# Patient Record
Sex: Female | Born: 1960 | Race: Black or African American | Hispanic: No | Marital: Single | Smoking: Former smoker
Health system: Southern US, Community
[De-identification: ages and names within clinical notes are randomized; demographics above are authoritative.]

## PROBLEM LIST (undated history)

## (undated) DIAGNOSIS — C342 Malignant neoplasm of middle lobe, bronchus or lung: Principal | ICD-10-CM

## (undated) DIAGNOSIS — Z923 Personal history of irradiation: Secondary | ICD-10-CM

## (undated) DIAGNOSIS — Z5111 Encounter for antineoplastic chemotherapy: Secondary | ICD-10-CM

## (undated) DIAGNOSIS — E876 Hypokalemia: Principal | ICD-10-CM

## (undated) DIAGNOSIS — J189 Pneumonia, unspecified organism: Secondary | ICD-10-CM

## (undated) DIAGNOSIS — I4892 Unspecified atrial flutter: Secondary | ICD-10-CM

## (undated) DIAGNOSIS — C801 Malignant (primary) neoplasm, unspecified: Secondary | ICD-10-CM

## (undated) HISTORY — PX: TUBAL LIGATION: SHX77

## (undated) HISTORY — DX: Encounter for antineoplastic chemotherapy: Z51.11

## (undated) HISTORY — DX: Malignant neoplasm of middle lobe, bronchus or lung: C34.2

## (undated) HISTORY — DX: Hypokalemia: E87.6

## (undated) HISTORY — PX: WISDOM TOOTH EXTRACTION: SHX21

---

## 2008-06-04 ENCOUNTER — Emergency Department (HOSPITAL_COMMUNITY): Admission: EM | Admit: 2008-06-04 | Discharge: 2008-06-04 | Payer: Self-pay | Admitting: Emergency Medicine

## 2009-01-10 ENCOUNTER — Emergency Department (HOSPITAL_COMMUNITY): Admission: EM | Admit: 2009-01-10 | Discharge: 2009-01-10 | Payer: Self-pay | Admitting: Emergency Medicine

## 2009-03-10 ENCOUNTER — Emergency Department (HOSPITAL_COMMUNITY): Admission: EM | Admit: 2009-03-10 | Discharge: 2009-03-10 | Payer: Self-pay | Admitting: Emergency Medicine

## 2011-10-08 DIAGNOSIS — C539 Malignant neoplasm of cervix uteri, unspecified: Secondary | ICD-10-CM | POA: Insufficient documentation

## 2013-12-25 DIAGNOSIS — R8789 Other abnormal findings in specimens from female genital organs: Secondary | ICD-10-CM | POA: Diagnosis not present

## 2014-05-24 DIAGNOSIS — C539 Malignant neoplasm of cervix uteri, unspecified: Secondary | ICD-10-CM | POA: Diagnosis not present

## 2014-05-24 DIAGNOSIS — Z9189 Other specified personal risk factors, not elsewhere classified: Secondary | ICD-10-CM | POA: Diagnosis not present

## 2014-05-24 DIAGNOSIS — C531 Malignant neoplasm of exocervix: Secondary | ICD-10-CM | POA: Diagnosis not present

## 2014-05-27 DIAGNOSIS — Z23 Encounter for immunization: Secondary | ICD-10-CM | POA: Diagnosis not present

## 2014-07-05 ENCOUNTER — Emergency Department (HOSPITAL_COMMUNITY)
Admission: EM | Admit: 2014-07-05 | Discharge: 2014-07-05 | Disposition: A | Discharge status: Home / Self Care | Payer: Medicare Other | Attending: Emergency Medicine | Admitting: Emergency Medicine

## 2014-07-05 DIAGNOSIS — M545 Low back pain, unspecified: Secondary | ICD-10-CM

## 2014-07-05 DIAGNOSIS — S3992XA Unspecified injury of lower back, initial encounter: Secondary | ICD-10-CM | POA: Diagnosis not present

## 2014-07-05 DIAGNOSIS — S199XXA Unspecified injury of neck, initial encounter: Secondary | ICD-10-CM | POA: Diagnosis not present

## 2014-07-05 DIAGNOSIS — Z79899 Other long term (current) drug therapy: Secondary | ICD-10-CM | POA: Insufficient documentation

## 2014-07-05 DIAGNOSIS — Y92410 Unspecified street and highway as the place of occurrence of the external cause: Secondary | ICD-10-CM | POA: Diagnosis not present

## 2014-07-05 DIAGNOSIS — Y9389 Activity, other specified: Secondary | ICD-10-CM | POA: Insufficient documentation

## 2014-07-05 DIAGNOSIS — M542 Cervicalgia: Secondary | ICD-10-CM

## 2014-07-05 MED ORDER — KETOROLAC TROMETHAMINE 60 MG/2ML IM SOLN
60.0000 mg | Freq: Once | INTRAMUSCULAR | Status: AC
  Administered 2014-07-05: 60 mg via INTRAMUSCULAR
  Filled 2014-07-05: qty 2

## 2014-07-05 MED ORDER — CYCLOBENZAPRINE HCL 10 MG PO TABS: 10.0000 mg | ORAL_TABLET | Freq: Two times a day (BID) | ORAL | Status: DC | PRN

## 2014-07-05 MED ORDER — TRAMADOL HCL 50 MG PO TABS: 50.0000 mg | ORAL_TABLET | Freq: Four times a day (QID) | ORAL | Status: DC | PRN
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
## 2014-07-05 NOTE — Discharge Instructions (Signed)
Take Tramadol as needed for pain. Take Flexeril as needed for muscle spasms. You may take these medications together. Refer to attached documents for more information.
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
## 2014-07-06 NOTE — ED Provider Notes (Signed)
Medical screening examination/treatment/procedure(s) were performed by non-physician practitioner and as supervising physician I was immediately available for consultation/collaboration.   EKG Interpretation None       Jasper Riling. Alvino Chapel, MD 07/06/14 [phone]

## 2014-10-04 DIAGNOSIS — Z124 Encounter for screening for malignant neoplasm of cervix: Secondary | ICD-10-CM | POA: Diagnosis not present

## 2014-10-04 DIAGNOSIS — R87610 Atypical squamous cells of undetermined significance on cytologic smear of cervix (ASC-US): Secondary | ICD-10-CM | POA: Diagnosis not present

## 2014-10-04 DIAGNOSIS — C539 Malignant neoplasm of cervix uteri, unspecified: Secondary | ICD-10-CM | POA: Diagnosis not present

## 2014-10-04 DIAGNOSIS — Z8541 Personal history of malignant neoplasm of cervix uteri: Secondary | ICD-10-CM | POA: Diagnosis not present

## 2014-10-04 DIAGNOSIS — N72 Inflammatory disease of cervix uteri: Secondary | ICD-10-CM | POA: Diagnosis not present

## 2015-02-07 DIAGNOSIS — Z1231 Encounter for screening mammogram for malignant neoplasm of breast: Secondary | ICD-10-CM | POA: Diagnosis not present

## 2015-02-07 DIAGNOSIS — Z8541 Personal history of malignant neoplasm of cervix uteri: Secondary | ICD-10-CM | POA: Diagnosis not present

## 2015-02-07 DIAGNOSIS — C539 Malignant neoplasm of cervix uteri, unspecified: Secondary | ICD-10-CM | POA: Diagnosis not present

## 2015-02-08 ENCOUNTER — Emergency Department (HOSPITAL_COMMUNITY): Payer: Medicare Other

## 2015-02-08 ENCOUNTER — Emergency Department (HOSPITAL_COMMUNITY)
Admission: EM | Admit: 2015-02-08 | Discharge: 2015-02-08 | Disposition: A | Discharge status: Home / Self Care | Payer: Medicare Other | Attending: Emergency Medicine | Admitting: Emergency Medicine

## 2015-02-08 ENCOUNTER — Encounter (HOSPITAL_COMMUNITY): Payer: Self-pay | Admitting: *Deleted

## 2015-02-08 DIAGNOSIS — Z79899 Other long term (current) drug therapy: Secondary | ICD-10-CM | POA: Insufficient documentation

## 2015-02-08 DIAGNOSIS — X58XXXA Exposure to other specified factors, initial encounter: Secondary | ICD-10-CM | POA: Diagnosis not present

## 2015-02-08 DIAGNOSIS — Z859 Personal history of malignant neoplasm, unspecified: Secondary | ICD-10-CM | POA: Diagnosis not present

## 2015-02-08 DIAGNOSIS — Y999 Unspecified external cause status: Secondary | ICD-10-CM | POA: Insufficient documentation

## 2015-02-08 DIAGNOSIS — Y929 Unspecified place or not applicable: Secondary | ICD-10-CM | POA: Diagnosis not present

## 2015-02-08 DIAGNOSIS — Y939 Activity, unspecified: Secondary | ICD-10-CM | POA: Insufficient documentation

## 2015-02-08 DIAGNOSIS — M542 Cervicalgia: Secondary | ICD-10-CM | POA: Diagnosis present

## 2015-02-08 DIAGNOSIS — S161XXA Strain of muscle, fascia and tendon at neck level, initial encounter: Secondary | ICD-10-CM | POA: Diagnosis not present

## 2015-02-08 HISTORY — DX: Malignant (primary) neoplasm, unspecified: C80.1

## 2015-02-08 MED ORDER — HYDROMORPHONE HCL 1 MG/ML IJ SOLN
1.0000 mg | Freq: Once | INTRAMUSCULAR | Status: AC
Start: 2015-02-08 — End: 2015-02-08
  Administered 2015-02-08: 1 mg via INTRAMUSCULAR
  Filled 2015-02-08: qty 1

## 2015-02-08 MED ORDER — KETOROLAC TROMETHAMINE 60 MG/2ML IM SOLN
60.0000 mg | Freq: Once | INTRAMUSCULAR | Status: AC
Start: 2015-02-08 — End: 2015-02-08
  Administered 2015-02-08: 60 mg via INTRAMUSCULAR
  Filled 2015-02-08: qty 2

## 2015-02-08 MED ORDER — DEXAMETHASONE SODIUM PHOSPHATE 10 MG/ML IJ SOLN
10.0000 mg | Freq: Once | INTRAMUSCULAR | Status: AC
  Administered 2015-02-08: 10 mg via INTRAMUSCULAR
  Filled 2015-02-08: qty 1

## 2015-02-08 MED ORDER — PREDNISONE 50 MG PO TABS: 50.0000 mg | ORAL_TABLET | Freq: Every day | ORAL | Status: DC

## 2015-02-08 MED ORDER — OXYCODONE-ACETAMINOPHEN 5-325 MG PO TABS
1.0000 | ORAL_TABLET | Freq: Once | ORAL | Status: AC
  Administered 2015-02-08: 1 via ORAL
  Filled 2015-02-08: qty 1

## 2015-02-08 MED ORDER — DIAZEPAM 5 MG PO TABS
5.0000 mg | ORAL_TABLET | Freq: Once | ORAL | Status: AC
  Administered 2015-02-08: 5 mg via ORAL
  Filled 2015-02-08: qty 1

## 2015-02-08 MED ORDER — HYDROCODONE-ACETAMINOPHEN 5-325 MG PO TABS: 1.0000 | ORAL_TABLET | Freq: Four times a day (QID) | ORAL | Status: DC | PRN

## 2015-02-08 MED ORDER — DIAZEPAM 5 MG PO TABS: 5.0000 mg | ORAL_TABLET | Freq: Three times a day (TID) | ORAL | Status: DC | PRN

## 2015-02-08 NOTE — ED Notes (Signed)
Patient states that yesterday she had a sharp pain in her left neck that has not gone away. She states she was unable to sleep last night. Patient states she has not had any injury to provoke the sudden onset of pain. Patient appears to be having spasms while we are speaking. She states it hurts when she moves and while at rest.

## 2015-02-08 NOTE — Discharge Instructions (Signed)
Your  x-rays did not show any abnormality.  Use ice and heat on your neck alternating. return here as needed.  Follow-up with a primary care doctor

## 2015-02-08 NOTE — ED Provider Notes (Signed)
CSN: [id number]     Arrival date & time 02/08/15  1100 History   First MD Initiated Contact with Patient 02/08/15 1125     Chief Complaint  Patient presents with  . Neck Pain     (Consider location/radiation/quality/duration/timing/severity/associated sxs/prior Treatment) HPI Patient presents to the emergency department with left-sided neck pain and the left lateral posterior neck that yesterday morning.  The patient states she woke up yesterday morning with neck pain.  She states as the day went on a get worse and got to the point where she could not sleep last night.  The patient states that she did not take any medications prior to arrival for her symptoms.  Movement and palpation make the pain worse.  The patient denies headache, blurred vision, nausea, vomiting, weakness, dizziness, lightheadedness, back pain, fever, chest pain, shortness of breath, neck swelling, rash, or syncope.  The patient states that applying pressure to the area does help somewhat with the pain Past Medical History  Diagnosis Date  . Cancer     5 years ago   History reviewed. No pertinent past surgical history. No family history on file. History  Substance Use Topics  . Smoking status: Never Smoker   . Smokeless tobacco: Not on file  . Alcohol Use: No   OB History    No data available     Review of Systems  All other systems negative except as documented in the HPI. All pertinent positives and negatives as reviewed in the HPI.-  Allergies  Review of patient's allergies indicates no known allergies.  Home Medications   Prior to Admission medications   Medication Sig Start Date End Date Taking? Authorizing Provider  Calcium Carb-Cholecalciferol 600-200 MG-UNIT TABS Take 2 tablets by mouth daily.   Yes Historical Provider, MD  Multiple Vitamins-Minerals (CENTRUM SILVER) tablet Take 1 tablet by mouth daily.   Yes Historical Provider, MD  cyclobenzaprine (FLEXERIL) 10 MG tablet Take 1 tablet (10 mg  total) by mouth 2 (two) times daily as needed for muscle spasms. Patient not taking: Reported on 02/08/2015 07/05/14   Alvina Chou, PA-C  traMADol (ULTRAM) 50 MG tablet Take 1 tablet (50 mg total) by mouth every 6 (six) hours as needed. Patient not taking: Reported on 02/08/2015 07/05/14   Kaitlyn Szekalski, PA-C   BP 128/75 mmHg  Pulse 91  Temp(Src) 98 F (36.7 C) (Oral)  Resp 20  SpO2 100% Physical Exam  Constitutional: She is oriented to person, place, and time. She appears well-developed and well-nourished. No distress.  HENT:  Head: Normocephalic and atraumatic.  Mouth/Throat: Oropharynx is clear and moist.  Eyes: Pupils are equal, round, and reactive to light.  Neck: Trachea normal and normal range of motion. Neck supple. Normal carotid pulses and no JVD present. Muscular tenderness present. No spinous process tenderness present. Carotid bruit is not present. No rigidity. No edema, no erythema and normal range of motion present. No thyroid mass and no thyromegaly present.    Cardiovascular: Normal rate, regular rhythm and normal heart sounds.  Exam reveals no gallop and no friction rub.   No murmur heard. Pulmonary/Chest: Effort normal. No respiratory distress.  Neurological: She is alert and oriented to person, place, and time. She exhibits normal muscle tone. Coordination normal.  Skin: Skin is warm and dry. No rash noted. No erythema.  Nursing note and vitals reviewed.   ED Course  Procedures (including critical care time) Labs Review Labs Reviewed - No data to display  Imaging Review Dg  Cervical Spine Complete  02/08/2015   CLINICAL DATA:  Severe left-sided neck pain with limited range of motion since yesterday. No known trauma.  EXAM: CERVICAL SPINE  4+ VIEWS  COMPARISON:  CT scan dated 06/04/2008  FINDINGS: There is no evidence of cervical spine fracture or prevertebral soft tissue swelling. Alignment is normal. No other significant bone abnormalities are  identified.  IMPRESSION: Negative cervical spine radiographs.   Electronically Signed   By: Lorriane Shire M.D.   On: 02/08/2015 12:35     Patient most likely has a sternocleidomastoid muscle irritation and spasm.  Patient does not have any other symptoms of an indicates any sort of vascular issue.  Movement and palpation make the pain worse.    Dalia Heading, PA-C 02/08/15 [place], MD 02/08/15 [phone]

## 2015-06-04 DIAGNOSIS — Z23 Encounter for immunization: Secondary | ICD-10-CM | POA: Diagnosis not present

## 2015-07-06 ENCOUNTER — Emergency Department (HOSPITAL_COMMUNITY)
Admission: EM | Admit: 2015-07-06 | Discharge: 2015-07-06 | Disposition: A | Discharge status: Home / Self Care | Payer: Medicare Other | Attending: Emergency Medicine | Admitting: Emergency Medicine

## 2015-07-06 ENCOUNTER — Encounter (HOSPITAL_COMMUNITY): Payer: Self-pay | Admitting: Emergency Medicine

## 2015-07-06 ENCOUNTER — Emergency Department (HOSPITAL_COMMUNITY): Payer: Medicare Other

## 2015-07-06 DIAGNOSIS — Z7952 Long term (current) use of systemic steroids: Secondary | ICD-10-CM | POA: Diagnosis not present

## 2015-07-06 DIAGNOSIS — J159 Unspecified bacterial pneumonia: Secondary | ICD-10-CM | POA: Insufficient documentation

## 2015-07-06 DIAGNOSIS — R05 Cough: Secondary | ICD-10-CM | POA: Diagnosis not present

## 2015-07-06 DIAGNOSIS — J189 Pneumonia, unspecified organism: Secondary | ICD-10-CM

## 2015-07-06 DIAGNOSIS — Z79899 Other long term (current) drug therapy: Secondary | ICD-10-CM | POA: Diagnosis not present

## 2015-07-06 DIAGNOSIS — R059 Cough, unspecified: Secondary | ICD-10-CM

## 2015-07-06 DIAGNOSIS — Z859 Personal history of malignant neoplasm, unspecified: Secondary | ICD-10-CM | POA: Insufficient documentation

## 2015-07-06 MED ORDER — AZITHROMYCIN 250 MG PO TABS: 250.0000 mg | ORAL_TABLET | Freq: Every day | ORAL | Status: DC

## 2015-07-06 MED ORDER — ALBUTEROL SULFATE HFA 108 (90 BASE) MCG/ACT IN AERS
1.0000 | INHALATION_SPRAY | Freq: Four times a day (QID) | RESPIRATORY_TRACT | Status: DC | PRN
Start: 2015-07-06 — End: 2015-07-30

## 2015-07-06 MED ORDER — HYDROCOD POLST-CPM POLST ER 10-8 MG/5ML PO SUER: 5.0000 mL | Freq: Two times a day (BID) | ORAL | Status: DC | PRN

## 2015-07-06 MED ORDER — BENZONATATE 100 MG PO CAPS
100.0000 mg | ORAL_CAPSULE | Freq: Three times a day (TID) | ORAL | Status: DC
Start: 2015-07-06 — End: 2015-07-22

## 2015-07-06 MED ORDER — AZITHROMYCIN 250 MG PO TABS
500.0000 mg | ORAL_TABLET | Freq: Once | ORAL | Status: AC
  Administered 2015-07-06: 500 mg via ORAL
  Filled 2015-07-06: qty 2

## 2015-07-06 NOTE — ED Notes (Signed)
Patient reports mowing the lawn prior to onset of symptoms

## 2015-07-06 NOTE — Discharge Instructions (Signed)
Take the prescribed medication as directed.  Use the tussionex syrup when home and/or sleeping, use tessalon perles for awake/work. Follow-up with your primary care physician.  It is recommended that you have repeat x-ray in 3-4 weeks to ensure pneumonia has cleared. Return to the ED for new or worsening symptoms.

## 2015-07-06 NOTE — ED Notes (Signed)
Onset of symptoms 3 weeks ago.  Symptoms includes coughing and coughing so much that head hurts.  Frequent coughing episodes.  Non-productive cough.  Denies fever.

## 2015-07-06 NOTE — ED Provider Notes (Signed)
CSN: [id number]     Arrival date & time 07/06/15  5462 History   First MD Initiated Contact with Patient 07/06/15 [phone]     Chief Complaint  Patient presents with  . URI     (Consider location/radiation/quality/duration/timing/severity/associated sxs/prior Treatment) Patient is a 54 y.o. female presenting with URI. The history is provided by the patient and medical records.  URI Presenting symptoms: cough     54 y.o. F with remote hx of cancer, presenting to the ED for URI type symptoms.  Specifically patient has had non-productive cough, headache, nasal congestion, and postnasal drip. She states she remote her lawn prior to onset of symptoms. She denies history of seasonal allergies. 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 Recently had a TB screening that was negative. She did receive her flu shot this year. She denies any fever, chills, sweats. She has some pain in her midsternal region with coughing, no other chest pain. No shortness of breath. Patient has no history of underlying asthma or COPD.  No intervention tried PTA.  VSS.  Past Medical History  Diagnosis Date  . Cancer     5 years ago   No past surgical history on file. No family history on file. Social History  Substance Use Topics  . Smoking status: Never Smoker   . Smokeless tobacco: Not on file  . Alcohol Use: No   OB History    No data available     Review of Systems  Respiratory: Positive for cough.   All other systems reviewed and are negative.     Allergies  Review of patient's allergies indicates no known allergies.  Home Medications   Prior to Admission medications   Medication Sig Start Date End Date Taking? Authorizing Provider  Calcium Carb-Cholecalciferol 600-200 MG-UNIT TABS Take 2 tablets by mouth daily.    Historical Provider, MD  cyclobenzaprine (FLEXERIL) 10 MG tablet Take 1 tablet (10 mg total) by mouth 2 (two) times daily as needed for muscle spasms. Patient  not taking: Reported on 02/08/2015 07/05/14   Alvina Chou, PA-C  diazepam (VALIUM) 5 MG tablet Take 1 tablet (5 mg total) by mouth every 8 (eight) hours as needed for muscle spasms. 02/08/15   Dalia Heading, PA-C  HYDROcodone-acetaminophen (NORCO/VICODIN) 5-325 MG per tablet Take 1 tablet by mouth every 6 (six) hours as needed for moderate pain. 02/08/15   Dalia Heading, PA-C  Multiple Vitamins-Minerals (CENTRUM SILVER) tablet Take 1 tablet by mouth daily.    Historical Provider, MD  predniSONE (DELTASONE) 50 MG tablet Take 1 tablet (50 mg total) by mouth daily with breakfast. 02/08/15   Dalia Heading, PA-C  traMADol (ULTRAM) 50 MG tablet Take 1 tablet (50 mg total) by mouth every 6 (six) hours as needed. Patient not taking: Reported on 02/08/2015 07/05/14   Kaitlyn Szekalski, PA-C   BP 130/73 mmHg  Pulse 94  Temp(Src) 99.1 F (37.3 C) (Oral)  Resp 22  Ht '5\' 8"'$  (1.727 m)  Wt 223 lb 3.2 oz (101.243 kg)  BMI 33.95 kg/m2  SpO2 100%   Physical Exam  Constitutional: She is oriented to person, place, and time. She appears well-developed and well-nourished. No distress.  HENT:  Head: Normocephalic and atraumatic.  Mouth/Throat: Oropharynx is clear and moist.  Eyes: Conjunctivae and EOM are normal. Pupils are equal, round, and reactive to light.  Neck: Normal range of motion. Neck supple.  Cardiovascular: Normal rate, regular rhythm and normal heart sounds.   Pulmonary/Chest:  Effort normal and breath sounds normal. No respiratory distress. She has no wheezes.  Respirations unlabored, lungs overall clear, repetitive dry cough during exam, speaking in full sentences without difficulty  Abdominal: Soft. Bowel sounds are normal. There is no tenderness. There is no guarding.  Musculoskeletal: Normal range of motion.  Neurological: She is alert and oriented to person, place, and time.  Skin: Skin is warm and dry. She is not diaphoretic.  Psychiatric: She has a normal mood and  affect.  Nursing note and vitals reviewed.   ED Course  Procedures (including critical care time) Labs Review Labs Reviewed - No data to display  Imaging Review Dg Chest 2 View  07/06/2015  CLINICAL DATA:  Nonproductive cough for 3 weeks EXAM: CHEST  2 VIEW COMPARISON:  None. FINDINGS: Dense right middle lobe consolidation. There is an element of volume loss as there is shift of the mediastinum to the right and downward displacement of the minor fissure. Lungs otherwise clear. Normal heart size. No pneumothorax. IMPRESSION: Right middle lobe consolidation and an element of volume loss. Underlying malignancy cannot be excluded. Followup PA and lateral chest X-ray is recommended in 3-4 weeks following trial of antibiotic therapy to ensure resolution and exclude underlying malignancy. Electronically Signed   By: Marybelle Killings M.D.   On: 07/06/2015 08:45      EKG Interpretation None      MDM   Final diagnoses:  CAP (community acquired pneumonia)  Cough   54 year old female with cough for the past 3 weeks. Patient is afebrile, nontoxic. Her lungs are overall clear. She does have a repetitive dry cough during exam. Denies chest pain or shortness of breath. Remainder exam is benign. Chest x-ray was obtained revealing right middle lobe consolidation. Given her symptoms will treat as pneumonia. I discussed need for repeat x-ray in 3-4 weeks to ensure resolution of x-ray findings as malignancy possible.  Patient VS remain stable on RA.  No respiratory distress.  She appears appropriate for outpatient management.  Rx azithromycin, albuterol inhaler, tessalon, tussionex.  Discussed plan with patient, he/she acknowledged understanding and agreed with plan of care.  Return precautions given for new or worsening symptoms.  Larene Pickett, PA-C 07/06/15 [id number]  Pattricia Boss, MD 07/06/15 [phone]

## 2015-07-12 ENCOUNTER — Emergency Department (HOSPITAL_COMMUNITY)
Admission: EM | Admit: 2015-07-12 | Discharge: 2015-07-13 | Disposition: A | Discharge status: Home / Self Care | Payer: Medicare Other | Attending: Emergency Medicine | Admitting: Emergency Medicine

## 2015-07-12 ENCOUNTER — Emergency Department (HOSPITAL_COMMUNITY): Payer: Medicare Other

## 2015-07-12 ENCOUNTER — Encounter (HOSPITAL_COMMUNITY): Payer: Self-pay | Admitting: Emergency Medicine

## 2015-07-12 DIAGNOSIS — Z8589 Personal history of malignant neoplasm of other organs and systems: Secondary | ICD-10-CM | POA: Diagnosis not present

## 2015-07-12 DIAGNOSIS — R0602 Shortness of breath: Secondary | ICD-10-CM | POA: Diagnosis not present

## 2015-07-12 DIAGNOSIS — R05 Cough: Secondary | ICD-10-CM

## 2015-07-12 DIAGNOSIS — R599 Enlarged lymph nodes, unspecified: Secondary | ICD-10-CM | POA: Insufficient documentation

## 2015-07-12 DIAGNOSIS — Z8701 Personal history of pneumonia (recurrent): Secondary | ICD-10-CM | POA: Insufficient documentation

## 2015-07-12 DIAGNOSIS — Z79899 Other long term (current) drug therapy: Secondary | ICD-10-CM | POA: Insufficient documentation

## 2015-07-12 DIAGNOSIS — R059 Cough, unspecified: Secondary | ICD-10-CM

## 2015-07-12 DIAGNOSIS — R062 Wheezing: Secondary | ICD-10-CM | POA: Diagnosis not present

## 2015-07-12 DIAGNOSIS — Z7952 Long term (current) use of systemic steroids: Secondary | ICD-10-CM | POA: Insufficient documentation

## 2015-07-12 DIAGNOSIS — R918 Other nonspecific abnormal finding of lung field: Secondary | ICD-10-CM

## 2015-07-12 DIAGNOSIS — R590 Localized enlarged lymph nodes: Secondary | ICD-10-CM

## 2015-07-12 DIAGNOSIS — J984 Other disorders of lung: Secondary | ICD-10-CM | POA: Diagnosis not present

## 2015-07-12 HISTORY — DX: Pneumonia, unspecified organism: J18.9

## 2015-07-12 LAB — I-STAT CHEM 8, ED
BUN: 11 mg/dL (ref 6–20)
CHLORIDE: 103 mmol/L (ref 101–111)
Calcium, Ion: 1.21 mmol/L (ref 1.12–1.23)
Creatinine, Ser: 0.9 mg/dL (ref 0.44–1.00)
Glucose, Bld: 115 mg/dL — ABNORMAL HIGH (ref 65–99)
HEMATOCRIT: 35 % — AB (ref 36.0–46.0)
HEMOGLOBIN: 11.9 g/dL — AB (ref 12.0–15.0)
POTASSIUM: 3.5 mmol/L (ref 3.5–5.1)
Sodium: 141 mmol/L (ref 135–145)
TCO2: 25 mmol/L (ref 0–100)

## 2015-07-12 MED ORDER — IOHEXOL 350 MG/ML SOLN
100.0000 mL | Freq: Once | INTRAVENOUS | Status: AC | PRN
  Administered 2015-07-12: 100 mL via INTRAVENOUS

## 2015-07-12 NOTE — ED Provider Notes (Signed)
CSN: [id number]     Arrival date & time 07/12/15  2104 History  By signing my name below, I, Emmanuella Mensah, attest that this documentation has been prepared under the direction and in the presence of Joushua Dugar Camprubi-Soms, PA. Electronically Signed: Judithann Sauger, ED Scribe. 07/12/2015. 10:17 PM.     Chief Complaint  Patient presents with  . Cough   Patient is a 54 y.o. female presenting with cough. The history is provided by the patient and medical records. No language interpreter was used.  Cough Cough characteristics:  Non-productive Severity:  Moderate Onset quality:  Gradual Duration:  3 weeks Timing:  Constant Progression:  Unchanged Chronicity:  New Smoker: no   Context: upper respiratory infection   Relieved by:  Nothing Worsened by:  Nothing tried Ineffective treatments:  Cough suppressants and beta-agonist inhaler (cough syrup, and azithromycin) Associated symptoms: shortness of breath and wheezing   Associated symptoms: no chest pain, no chills, no ear pain, no fever, no myalgias, no rhinorrhea, no sinus congestion and no sore throat   Shortness of breath:    Severity:  Mild   Onset quality:  Gradual   Duration:  3 weeks   Timing:  Intermittent   Progression:  Unchanged Wheezing:    Severity:  Mild   Onset quality:  Gradual   Duration:  3 weeks   Timing:  Intermittent   Progression:  Resolved   Chronicity:  New Risk factors: no recent travel    HPI Comments: Theresa [name] is a 54 y.o. female with a hx of cervical cancer 5 years ago and pneumonia, who presents to the Emergency Department complaining of unchanged moderate intermittent non productive cough onset 3 weeks ago after she mowed her lawn. She reports that she was seen on 07/06/15 and was diagnosed with pneumonia, placed on azithromycin, albuterol, tesslon perles, and tussionex. She adds that she had to get half of the prescribed amount of the cough syrup due to cost, but reports that she hasn't  had significant relief of her symptoms since she completed the medications. She reports mild SOB and wheezing which resolves after using her inhaler. She denies any fever, chills, CP, ear pain/drainage, rhinorrhea, sore throat, sputum production/hemoptysis, LE swelling, recent travel/surgery/immobilization, estrogen use, abdominal pain, n/v/d/c, dysuria, hematuria, numbness, tingling, or weakness. She denies smoking. Denies sick contacts or hx of Asthma/COPD.   Past Medical History  Diagnosis Date  . Cancer ([place])     5 years ago  . Pneumonia    History reviewed. No pertinent past surgical history. No family history on file. Social History  Substance Use Topics  . Smoking status: Never Smoker   . Smokeless tobacco: None  . Alcohol Use: No   OB History    No data available     Review of Systems  Constitutional: Negative for fever and chills.  HENT: Negative for ear discharge, ear pain, rhinorrhea, sore throat and trouble swallowing.   Respiratory: Positive for cough, shortness of breath and wheezing.   Cardiovascular: Negative for chest pain and leg swelling.  Gastrointestinal: Negative for nausea, vomiting, abdominal pain, diarrhea and constipation.  Genitourinary: Negative for dysuria and hematuria.  Musculoskeletal: Negative for myalgias and arthralgias.  Skin: Negative for color change.  Allergic/Immunologic: Negative for immunocompromised state.  Neurological: Negative for weakness.  10 Systems reviewed and all are negative for acute change except as noted in the HPI.    Allergies  Review of patient's allergies indicates no known allergies.  Home Medications  Prior to Admission medications   Medication Sig Start Date End Date Taking? Authorizing Provider  albuterol (PROVENTIL HFA;VENTOLIN HFA) 108 (90 BASE) MCG/ACT inhaler Inhale 1-2 puffs into the lungs every 6 (six) hours as needed for wheezing. 07/06/15   Larene Pickett, PA-C  azithromycin (ZITHROMAX) 250 MG tablet  Take 1 tablet (250 mg total) by mouth daily. Take first 2 tablets together, then 1 every day until finished. 07/06/15   Larene Pickett, PA-C  benzonatate (TESSALON) 100 MG capsule Take 1 capsule (100 mg total) by mouth every 8 (eight) hours. 07/06/15   Larene Pickett, PA-C  Calcium Carb-Cholecalciferol 600-200 MG-UNIT TABS Take 2 tablets by mouth daily.    Historical Provider, MD  chlorpheniramine-HYDROcodone (TUSSIONEX PENNKINETIC ER) 10-8 MG/5ML SUER Take 5 mLs by mouth every 12 (twelve) hours as needed for cough. 07/06/15   Larene Pickett, PA-C  cyclobenzaprine (FLEXERIL) 10 MG tablet Take 1 tablet (10 mg total) by mouth 2 (two) times daily as needed for muscle spasms. Patient not taking: Reported on 02/08/2015 07/05/14   Alvina Chou, PA-C  diazepam (VALIUM) 5 MG tablet Take 1 tablet (5 mg total) by mouth every 8 (eight) hours as needed for muscle spasms. 02/08/15   Dalia Heading, PA-C  HYDROcodone-acetaminophen (NORCO/VICODIN) 5-325 MG per tablet Take 1 tablet by mouth every 6 (six) hours as needed for moderate pain. 02/08/15   Dalia Heading, PA-C  Multiple Vitamins-Minerals (CENTRUM SILVER) tablet Take 1 tablet by mouth daily.    Historical Provider, MD  predniSONE (DELTASONE) 50 MG tablet Take 1 tablet (50 mg total) by mouth daily with breakfast. 02/08/15   Dalia Heading, PA-C  traMADol (ULTRAM) 50 MG tablet Take 1 tablet (50 mg total) by mouth every 6 (six) hours as needed. Patient not taking: Reported on 02/08/2015 07/05/14   Kaitlyn Szekalski, PA-C   BP 100/76 mmHg  Pulse 85  Temp(Src) 98.2 F (36.8 C) (Oral)  Resp 18  Ht '5\' 9"'$  (1.753 m)  Wt 233 lb (105.688 kg)  BMI 34.39 kg/m2  SpO2 98% Physical Exam  Constitutional: She is oriented to person, place, and time. Vital signs are normal. She appears well-developed and well-nourished.  Non-toxic appearance. No distress.  Afebrile, nontoxic, NAD  HENT:  Head: Normocephalic and atraumatic.  Mouth/Throat: Oropharynx is  clear and moist and mucous membranes are normal.  Eyes: Conjunctivae and EOM are normal. Right eye exhibits no discharge. Left eye exhibits no discharge.  Neck: Normal range of motion. Neck supple.  Cardiovascular: Normal rate, regular rhythm, normal heart sounds and intact distal pulses.  Exam reveals no gallop and no friction rub.   No murmur heard. Pulmonary/Chest: Effort normal. No respiratory distress. She has decreased breath sounds in the right upper field. She has no wheezes. She has no rhonchi. She has no rales.  Slightly diminished breath sounds in RUL although difficult to discern if this is due to poor effort, otherwise CTAB in all lung fields, no w/r/r, no hypoxia or increased WOB, speaking in full sentences, SpO2 98% on RA. Dry intermittent cough during exam.   Abdominal: Soft. Normal appearance and bowel sounds are normal. She exhibits no distension. There is no tenderness. There is no rigidity, no rebound, no guarding, no CVA tenderness, no tenderness at McBurney's point and negative Murphy's sign.  Musculoskeletal: Normal range of motion.  Neurological: She is alert and oriented to person, place, and time. She has normal strength. No sensory deficit.  Skin: Skin is warm, dry and intact. No rash  noted.  Psychiatric: She has a normal mood and affect.  Nursing note and vitals reviewed.   ED Course  Procedures (including critical care time) DIAGNOSTIC STUDIES: Oxygen Saturation is 98% on RA, normal by my interpretation.    COORDINATION OF CARE: 10:07 PM- Pt advised of plan for treatment and pt agrees. Will monitor progress in 3-4 weeks after antibiotics. Will consult with Attending about further evaluation and care.    Results for orders placed or performed during the hospital encounter of 07/12/15  I-stat chem 8, ed  Result Value Ref Range   Sodium 141 135 - 145 mmol/L   Potassium 3.5 3.5 - 5.1 mmol/L   Chloride 103 101 - 111 mmol/L   BUN 11 6 - 20 mg/dL   Creatinine, Ser  0.90 0.44 - 1.00 mg/dL   Glucose, Bld 115 (H) 65 - 99 mg/dL   Calcium, Ion 1.21 1.12 - 1.23 mmol/L   TCO2 25 0 - 100 mmol/L   Hemoglobin 11.9 (L) 12.0 - 15.0 g/dL   HCT 35.0 (L) 36.0 - 46.0 %   Dg Chest 2 View  07/06/2015  CLINICAL DATA:  Nonproductive cough for 3 weeks EXAM: CHEST  2 VIEW COMPARISON:  None. FINDINGS: Dense right middle lobe consolidation. There is an element of volume loss as there is shift of the mediastinum to the right and downward displacement of the minor fissure. Lungs otherwise clear. Normal heart size. No pneumothorax. IMPRESSION: Right middle lobe consolidation and an element of volume loss. Underlying malignancy cannot be excluded. Followup PA and lateral chest X-ray is recommended in 3-4 weeks following trial of antibiotic therapy to ensure resolution and exclude underlying malignancy. Electronically Signed   By: Marybelle Killings M.D.   On: 07/06/2015 08:45   [name], Patty Sermons, PA-C personally reviewed and evaluated these images and lab results as part of her medical decision-making.   EKG Interpretation None      MDM   Final diagnoses:  Cough  Pulmonary mass  Thoracic lymphadenopathy    54 y.o. female here with ongoing cough x3 weeks, seen on 10/15 and CXR revealed concern for PNA vs underlying malignancy and recommended abx followed by repeat CXR in 3-4wks. Given that she completed course with no relief, but it hasn't been adequate time to get repeat CXR, and the fact that she has hx of CA, will proceed with CT imaging to r/o mass/malignancy. Pt's lung exam clear, possible slightly diminished in RUL, no wheezing. Doubt need for nebs or medications at this time. Will get chem 8 to check kidney function. Will reassess shortly.   12:12 AM Chem 8 with preserved kidney function, mild anemia noted. CT chest showing 6.1x4.1cm RUL mass extending into the RML and hilar region. Some postobstructive opacity, and thoracic lymphadenopathy. Given this finding,  will touch base with oncology to discuss close f/up vs admission for further work up. Pt does have a PCP in [facility] that she sees regularly.   12:28 AM Dr. Marin Olp of oncology returning page, feels the pt can be worked up outpt, would like her to f/up with Dr. Neil Crouch next week since he's out of the office for the week. Discussed at length with the pt, who feels comfortable going home and having close f/up. I explained the diagnosis and have given explicit precautions to return to the ER including for any other new or worsening symptoms. The patient understands and accepts the medical plan as it's been dictated and I have answered their questions. Discharge  instructions concerning home care and prescriptions have been given. The patient is STABLE and is discharged to home in good condition.   I personally performed the services described in this documentation, which was scribed in my presence. The recorded information has been reviewed and is accurate.    BP 100/76 mmHg  Pulse 85  Temp(Src) 98.2 F (36.8 C) (Oral)  Resp 18  Ht '5\' 9"'$  (1.753 m)  Wt 233 lb (105.688 kg)  BMI 34.39 kg/m2  SpO2 98%  Meds ordered this encounter  Medications  . iohexol (OMNIPAQUE) 350 MG/ML injection 100 mL    Sig:      Theresa Rozas Camprubi-Soms, PA-C 07/13/15 0038  Nat Christen, MD 07/13/15 [phone]

## 2015-07-12 NOTE — ED Notes (Signed)
C/o non-productive cough x 3 weeks.  States she was seen here on 10/15 for same.  Completed antibiotics.  States she only got 1/2 of cough syrup filled because of cost.  Pt states she doesn't feel any better.

## 2015-07-13 NOTE — Discharge Instructions (Signed)
Call Dr. Worthy Flank office on Monday to schedule an appointment with him for this week to have ongoing management of your cough and lung mass. Continue using your home inhaler as directed as needed for wheezing or cough/shortness of breath. Return to the ER for changes or worsening symptoms.   Cough, Adult Coughing is a reflex that clears your throat and your airways. Coughing helps to heal and protect your lungs. It is normal to cough occasionally, but a cough that happens with other symptoms or lasts a long time may be a sign of a condition that needs treatment. A cough may last only 2-3 weeks (acute), or it may last longer than 8 weeks (chronic). CAUSES Coughing is commonly caused by:  Breathing in substances that irritate your lungs.  A viral or bacterial respiratory infection.  Allergies.  Asthma.  Postnasal drip.  Smoking.  Acid backing up from the stomach into the esophagus (gastroesophageal reflux).  Certain medicines.  Chronic lung problems, including COPD (or rarely, lung cancer).  Other medical conditions such as heart failure. HOME CARE INSTRUCTIONS  Pay attention to any changes in your symptoms. Take these actions to help with your discomfort:  Take medicines only as told by your health care provider.  If you were prescribed an antibiotic medicine, take it as told by your health care provider. Do not stop taking the antibiotic even if you start to feel better.  Talk with your health care provider before you take a cough suppressant medicine.  Drink enough fluid to keep your urine clear or pale yellow.  If the air is dry, use a cold steam vaporizer or humidifier in your bedroom or your home to help loosen secretions.  Avoid anything that causes you to cough at work or at home.  If your cough is worse at night, try sleeping in a semi-upright position.  Avoid cigarette smoke. If you smoke, quit smoking. If you need help quitting, ask your health care  provider.  Avoid caffeine.  Avoid alcohol.  Rest as needed. SEEK MEDICAL CARE IF:   You have new symptoms.  You cough up pus.  Your cough does not get better after 2-3 weeks, or your cough gets worse.  You cannot control your cough with suppressant medicines and you are losing sleep.  You develop pain that is getting worse or pain that is not controlled with pain medicines.  You have a fever.  You have unexplained weight loss.  You have night sweats. SEEK IMMEDIATE MEDICAL CARE IF:  You cough up blood.  You have difficulty breathing.  Your heartbeat is very fast.   This information is not intended to replace advice given to you by your health care provider. Make sure you discuss any questions you have with your health care provider.   Document Released: 03/06/2011 Document Revised: 05/29/2015 Document Reviewed: 11/14/2014 Elsevier Interactive Patient Education [employer].

## 2015-07-15 ENCOUNTER — Encounter (HOSPITAL_COMMUNITY): Payer: Self-pay | Admitting: Cardiology

## 2015-07-15 ENCOUNTER — Emergency Department (HOSPITAL_COMMUNITY)
Admission: EM | Admit: 2015-07-15 | Discharge: 2015-07-15 | Disposition: A | Discharge status: Home / Self Care | Payer: Medicare Other | Attending: Emergency Medicine | Admitting: Emergency Medicine

## 2015-07-15 ENCOUNTER — Emergency Department (HOSPITAL_COMMUNITY): Payer: Medicare Other

## 2015-07-15 DIAGNOSIS — R110 Nausea: Secondary | ICD-10-CM

## 2015-07-15 DIAGNOSIS — R112 Nausea with vomiting, unspecified: Secondary | ICD-10-CM | POA: Insufficient documentation

## 2015-07-15 DIAGNOSIS — R918 Other nonspecific abnormal finding of lung field: Secondary | ICD-10-CM | POA: Insufficient documentation

## 2015-07-15 DIAGNOSIS — R05 Cough: Secondary | ICD-10-CM | POA: Insufficient documentation

## 2015-07-15 DIAGNOSIS — R0602 Shortness of breath: Secondary | ICD-10-CM | POA: Insufficient documentation

## 2015-07-15 DIAGNOSIS — R059 Cough, unspecified: Secondary | ICD-10-CM

## 2015-07-15 DIAGNOSIS — Z79899 Other long term (current) drug therapy: Secondary | ICD-10-CM | POA: Diagnosis not present

## 2015-07-15 DIAGNOSIS — Z8701 Personal history of pneumonia (recurrent): Secondary | ICD-10-CM | POA: Insufficient documentation

## 2015-07-15 LAB — URINE MICROSCOPIC-ADD ON

## 2015-07-15 LAB — COMPREHENSIVE METABOLIC PANEL
ALK PHOS: 76 U/L (ref 38–126)
ALT: 14 U/L (ref 14–54)
ANION GAP: 8 (ref 5–15)
AST: 20 U/L (ref 15–41)
Albumin: 3.4 g/dL — ABNORMAL LOW (ref 3.5–5.0)
BILIRUBIN TOTAL: 0.7 mg/dL (ref 0.3–1.2)
BUN: <5 mg/dL — ABNORMAL LOW (ref 6–20)
CHLORIDE: 104 mmol/L (ref 101–111)
CO2: 26 mmol/L (ref 22–32)
Calcium: 9.6 mg/dL (ref 8.9–10.3)
Creatinine, Ser: 0.89 mg/dL (ref 0.44–1.00)
GLUCOSE: 121 mg/dL — AB (ref 65–99)
Potassium: 3.2 mmol/L — ABNORMAL LOW (ref 3.5–5.1)
Sodium: 138 mmol/L (ref 135–145)
TOTAL PROTEIN: 8.1 g/dL (ref 6.5–8.1)

## 2015-07-15 LAB — URINALYSIS, ROUTINE W REFLEX MICROSCOPIC
BILIRUBIN URINE: NEGATIVE
GLUCOSE, UA: NEGATIVE mg/dL
HGB URINE DIPSTICK: NEGATIVE
KETONES UR: NEGATIVE mg/dL
Nitrite: NEGATIVE
PROTEIN: NEGATIVE mg/dL
Specific Gravity, Urine: 1.011 (ref 1.005–1.030)
Urobilinogen, UA: 1 mg/dL (ref 0.0–1.0)
pH: 6.5 (ref 5.0–8.0)

## 2015-07-15 LAB — CBC
HEMATOCRIT: 32.6 % — AB (ref 36.0–46.0)
HEMOGLOBIN: 10.7 g/dL — AB (ref 12.0–15.0)
MCH: 28.8 pg (ref 26.0–34.0)
MCHC: 32.8 g/dL (ref 30.0–36.0)
MCV: 87.6 fL (ref 78.0–100.0)
Platelets: 397 10*3/uL (ref 150–400)
RBC: 3.72 MIL/uL — ABNORMAL LOW (ref 3.87–5.11)
RDW: 15.1 % (ref 11.5–15.5)
WBC: 6.3 10*3/uL (ref 4.0–10.5)

## 2015-07-15 LAB — LIPASE, BLOOD: Lipase: 21 U/L (ref 11–51)

## 2015-07-15 MED ORDER — ONDANSETRON 4 MG PO TBDP
4.0000 mg | ORAL_TABLET | Freq: Once | ORAL | Status: AC
  Administered 2015-07-15: 4 mg via ORAL
  Filled 2015-07-15: qty 1

## 2015-07-15 MED ORDER — ONDANSETRON 4 MG PO TBDP: 4.0000 mg | ORAL_TABLET | Freq: Three times a day (TID) | ORAL | Status: DC | PRN

## 2015-07-15 MED ORDER — DIPHENHYDRAMINE HCL 25 MG PO TABS: 25.0000 mg | ORAL_TABLET | Freq: Every evening | ORAL | Status: DC | PRN

## 2015-07-15 MED ORDER — ONDANSETRON HCL 4 MG/2ML IJ SOLN: 4.0000 mg | Freq: Once | INTRAMUSCULAR | Status: DC

## 2015-07-15 NOTE — Discharge Instructions (Signed)
You have been seen today for cough and nausea. Your imaging and lab tests showed no abnormalities. Follow up with PCP or the [facility] as soon as possible to get a biopsy. Return to ED should symptoms worsen.

## 2015-07-15 NOTE — ED Notes (Signed)
MD at bedside. 

## 2015-07-15 NOTE — ED Notes (Signed)
CT called and is going to make disc for pt.

## 2015-07-15 NOTE — ED Provider Notes (Signed)
CSN: [id number]     Arrival date & time 07/15/15  1054 History   First MD Initiated Contact with Patient 07/15/15 1459     Chief Complaint  Patient presents with  . Cough  . Emesis     (Consider location/radiation/quality/duration/timing/severity/associated sxs/prior Treatment) HPI   Theresa [name] is a 54 y.o. female, pt with recent history of pneumonia, presents with nausea and non-productive cough accompanied by intermittent shortness of breath. States the inhaler and cough syrup she received helped, but pt is now out of the cough syrup. Pt was seen 10/15 and 10/21 for the similar complaints. CT on 10/21 revealed mass in right lung.  Pt spoke with oncologist at [facility], where she receives her 6 month checkups she is required to have for her remission of cervical cancer 5 years ago. Recommended pt do a biopsy.  Pt has not had biopsy yet. Denies fever/chills, N/V, night sweats, or any other complaints.   Past Medical History  Diagnosis Date  . Cancer ([place])     5 years ago  . Pneumonia    History reviewed. No pertinent past surgical history. History reviewed. No pertinent family history. Social History  Substance Use Topics  . Smoking status: Never Smoker   . Smokeless tobacco: None  . Alcohol Use: No   OB History    No data available     Review of Systems  Constitutional: Positive for appetite change. Negative for fever, chills, diaphoresis and unexpected weight change.  Respiratory: Positive for cough and shortness of breath. Negative for chest tightness and wheezing.   Cardiovascular: Negative for chest pain and palpitations.  Gastrointestinal: Positive for nausea and diarrhea. Negative for vomiting, abdominal pain and blood in stool.  Genitourinary: Negative for dysuria, flank pain and difficulty urinating.  Musculoskeletal: Negative for myalgias and arthralgias.  Skin: Negative for color change, pallor and rash.  Neurological: Negative for dizziness,  syncope, weakness, light-headedness, numbness and headaches.  All other systems reviewed and are negative.     Allergies  Review of patient's allergies indicates no known allergies.  Home Medications   Prior to Admission medications   Medication Sig Start Date End Date Taking? Authorizing Provider  AFLURIA PRESERVATIVE FREE 0.5 ML SUSY Inject 0.5 mLs into the muscle once. 06/04/15  Yes Historical Provider, MD  albuterol (PROVENTIL HFA;VENTOLIN HFA) 108 (90 BASE) MCG/ACT inhaler Inhale 1-2 puffs into the lungs every 6 (six) hours as needed for wheezing. 07/06/15  Yes Larene Pickett, PA-C  azithromycin (ZITHROMAX) 250 MG tablet Take 1 tablet (250 mg total) by mouth daily. Take first 2 tablets together, then 1 every day until finished. 07/06/15   Larene Pickett, PA-C  benzonatate (TESSALON) 100 MG capsule Take 1 capsule (100 mg total) by mouth every 8 (eight) hours. 07/06/15   Larene Pickett, PA-C  chlorpheniramine-HYDROcodone (TUSSIONEX PENNKINETIC ER) 10-8 MG/5ML SUER Take 5 mLs by mouth every 12 (twelve) hours as needed for cough. 07/06/15   Larene Pickett, PA-C  diazepam (VALIUM) 5 MG tablet Take 1 tablet (5 mg total) by mouth every 8 (eight) hours as needed for muscle spasms. 02/08/15   Dalia Heading, PA-C  diphenhydrAMINE (BENADRYL) 25 MG tablet Take 1 tablet (25 mg total) by mouth at bedtime as needed for sleep. 07/15/15   Shawn C Joy, PA-C  HYDROcodone-acetaminophen (NORCO/VICODIN) 5-325 MG per tablet Take 1 tablet by mouth every 6 (six) hours as needed for moderate pain. 02/08/15   Dalia Heading, PA-C  ondansetron [place]  ODT) 4 MG disintegrating tablet Take 1 tablet (4 mg total) by mouth every 8 (eight) hours as needed for nausea or vomiting. 07/15/15   Shawn C Joy, PA-C  predniSONE (DELTASONE) 50 MG tablet Take 1 tablet (50 mg total) by mouth daily with breakfast. 02/08/15   Dalia Heading, PA-C   BP 112/67 mmHg  Pulse 96  Temp(Src) 98.8 F (37.1 C) (Oral)  Resp 18  Ht  '5\' 8"'$  (1.727 m)  Wt 231 lb (104.781 kg)  BMI 35.13 kg/m2  SpO2 99% Physical Exam  Constitutional: She appears well-developed and well-nourished. No distress.  HENT:  Head: Normocephalic and atraumatic.  Eyes: Conjunctivae are normal.  Cardiovascular: Normal rate, regular rhythm and normal heart sounds.   Pulmonary/Chest: Effort normal and breath sounds normal. No respiratory distress.  No subclavicular lymphadenopathy.   Abdominal: Soft. Bowel sounds are normal.  Musculoskeletal: She exhibits no edema or tenderness.  Lymphadenopathy:    She has no cervical adenopathy.  Neurological: She is alert.  Skin: Skin is warm and dry. She is not diaphoretic.  Nursing note and vitals reviewed.   ED Course  Procedures (including critical care time) Labs Review Labs Reviewed  COMPREHENSIVE METABOLIC PANEL - Abnormal; Notable for the following:    Potassium 3.2 (*)    Glucose, Bld 121 (*)    BUN <5 (*)    Albumin 3.4 (*)    All other components within normal limits  CBC - Abnormal; Notable for the following:    RBC 3.72 (*)    Hemoglobin 10.7 (*)    HCT 32.6 (*)    All other components within normal limits  URINALYSIS, ROUTINE W REFLEX MICROSCOPIC (NOT AT [facility]) - Abnormal; Notable for the following:    APPearance CLOUDY (*)    Leukocytes, UA LARGE (*)    All other components within normal limits  URINE MICROSCOPIC-ADD ON - Abnormal; Notable for the following:    Squamous Epithelial / LPF FEW (*)    All other components within normal limits  LIPASE, BLOOD    Imaging Review Dg Chest 2 View  07/15/2015  CLINICAL DATA:  Nonproductive cough subsequent evaluation EXAM: CHEST  2 VIEW COMPARISON:  07/06/2015 FINDINGS: Known right perihilar and upper lobe mass again identified. The opacity created by this on the chest radiograph is unchanged. Some mild peripheral opacities suggest post obstruction volume loss as seen on CT scan performed 07/12/2015. No pleural effusion. Left lung remains  clear. IMPRESSION: Stable known right sided mass and associated opacity. No change from 07/06/2015. Electronically Signed   By: Skipper Cliche M.D.   On: 07/15/2015 15:29   I have personally reviewed and evaluated these images and lab results as part of my medical decision-making.   EKG Interpretation None      MDM   Final diagnoses:  Cough  Nausea  Lung mass    BUSHRA DENMAN presents with  3:24 PM attempted to make patient contact. Pt in xray.  Pt has not yet made the necessary appointments to get a biopsy for her lung mass, which is very probably causing some of her symptoms. Pt is afebrile, has a non-productive cough, vitals are stable, and CXR today shows no signs of cardiopulmonary disease other than the known mass in the right lung.  Pt discharged with zofran and instructions for benadryl or melatonin for sleep.  Pt to follow up with [facility] or PCP for biopsy. Plan of care communicated with pt and pt agreed.   CT scan  from 10/21: IMPRESSION: No evidence of pulmonary embolism.  6.1 x 4.1 cm posterior right upper lobe mass extending to the right middle lobe and right hilar region. Associated postobstructive opacity in the right upper and middle lobes.  Thoracic lymphadenopathy, measuring up to 2.3 cm short axis, as above.  Lorayne Bender, PA-C 07/15/15 1705  Leonard Schwartz, MD 07/15/15 [phone]

## 2015-07-15 NOTE — ED Notes (Signed)
Entered pts room to give zofran and found pt eating [employer].  Pt states she was trying to help her nausea, but felt no different.

## 2015-07-15 NOTE — ED Notes (Signed)
Pt to be discharged and received disc in [place].

## 2015-07-15 NOTE — ED Notes (Signed)
Reports a non-productive cough that she was seen for here on Friday and had CT scan that showed a nodule on her lung. Was to follow up with her PCP but was told to come here because she is still not feeling well. Having vomiting this morning.

## 2015-07-16 ENCOUNTER — Telehealth: Payer: Self-pay | Admitting: Internal Medicine

## 2015-07-16 ENCOUNTER — Encounter: Payer: Self-pay | Admitting: *Deleted

## 2015-07-16 ENCOUNTER — Telehealth: Payer: Self-pay | Admitting: *Deleted

## 2015-07-16 DIAGNOSIS — R918 Other nonspecific abnormal finding of lung field: Secondary | ICD-10-CM | POA: Insufficient documentation

## 2015-07-16 NOTE — Telephone Encounter (Signed)
Call Documentation      Colan Neptune at 07/16/2015 1:32 PM     Status: Signed       Expand All Collapse All   Left message for patient and gave np appt for 11/01 @ 1:45 w/Dr. Julien Nordmann

## 2015-07-16 NOTE — Telephone Encounter (Signed)
Left message for patient and gave np appt for 11/01 @ 1:45 w/Dr. Julien Nordmann

## 2015-07-16 NOTE — Telephone Encounter (Signed)
Voicemail received from patient asking for Dr. Worthy Flank nurse for a new patient appointment.  Voicemail forwarded to [phone], new patient coordinator. ED visits this week with cough and images.

## 2015-07-22 ENCOUNTER — Emergency Department (HOSPITAL_COMMUNITY): Payer: Medicare Other

## 2015-07-22 ENCOUNTER — Emergency Department (HOSPITAL_COMMUNITY)
Admission: EM | Admit: 2015-07-22 | Discharge: 2015-07-22 | Disposition: A | Discharge status: Home / Self Care | Payer: Medicare Other | Attending: Emergency Medicine | Admitting: Emergency Medicine

## 2015-07-22 ENCOUNTER — Encounter (HOSPITAL_COMMUNITY): Payer: Self-pay | Admitting: *Deleted

## 2015-07-22 DIAGNOSIS — Z8541 Personal history of malignant neoplasm of cervix uteri: Secondary | ICD-10-CM | POA: Insufficient documentation

## 2015-07-22 DIAGNOSIS — Z7952 Long term (current) use of systemic steroids: Secondary | ICD-10-CM | POA: Diagnosis not present

## 2015-07-22 DIAGNOSIS — Z8701 Personal history of pneumonia (recurrent): Secondary | ICD-10-CM | POA: Diagnosis not present

## 2015-07-22 DIAGNOSIS — R1310 Dysphagia, unspecified: Secondary | ICD-10-CM | POA: Diagnosis not present

## 2015-07-22 DIAGNOSIS — Z79899 Other long term (current) drug therapy: Secondary | ICD-10-CM | POA: Insufficient documentation

## 2015-07-22 DIAGNOSIS — R059 Cough, unspecified: Secondary | ICD-10-CM

## 2015-07-22 DIAGNOSIS — R05 Cough: Secondary | ICD-10-CM | POA: Diagnosis not present

## 2015-07-22 DIAGNOSIS — R918 Other nonspecific abnormal finding of lung field: Secondary | ICD-10-CM | POA: Insufficient documentation

## 2015-07-22 DIAGNOSIS — R221 Localized swelling, mass and lump, neck: Secondary | ICD-10-CM | POA: Diagnosis not present

## 2015-07-22 LAB — I-STAT CHEM 8, ED
BUN: 7 mg/dL (ref 6–20)
CREATININE: 0.7 mg/dL (ref 0.44–1.00)
Calcium, Ion: 1.21 mmol/L (ref 1.12–1.23)
Chloride: 101 mmol/L (ref 101–111)
Glucose, Bld: 111 mg/dL — ABNORMAL HIGH (ref 65–99)
HEMATOCRIT: 34 % — AB (ref 36.0–46.0)
HEMOGLOBIN: 11.6 g/dL — AB (ref 12.0–15.0)
POTASSIUM: 3.4 mmol/L — AB (ref 3.5–5.1)
SODIUM: 139 mmol/L (ref 135–145)
TCO2: 24 mmol/L (ref 0–100)

## 2015-07-22 MED ORDER — IOHEXOL 300 MG/ML  SOLN
75.0000 mL | Freq: Once | INTRAMUSCULAR | Status: AC | PRN
  Administered 2015-07-22: 75 mL via INTRAVENOUS

## 2015-07-22 MED ORDER — HYDROCOD POLST-CPM POLST ER 10-8 MG/5ML PO SUER: 5.0000 mL | Freq: Two times a day (BID) | ORAL | Status: DC | PRN

## 2015-07-22 MED ORDER — SODIUM CHLORIDE 0.9 % IV BOLUS (SEPSIS)
1000.0000 mL | Freq: Once | INTRAVENOUS | Status: AC
Start: 2015-07-22 — End: 2015-07-22
  Administered 2015-07-22: 1000 mL via INTRAVENOUS

## 2015-07-22 NOTE — ED Provider Notes (Signed)
CSN: [id number]     Arrival date & time 07/22/15  5400 History   First MD Initiated Contact with Patient 07/22/15 1306     Chief Complaint  Patient presents with  . Cough, Difficulty Swallowing      (Consider location/radiation/quality/duration/timing/severity/associated sxs/prior Treatment) HPI  54 year old female with a past history of cervical cancer presents with a two-month history of cough. Cough is not getting any better despite being on antibiotics early in the month for possible pneumonia as well as 2 different antitussives. She is out of her cough medicine and was told she should not be on other cough medicine because the provider was not sure if this would "feed her cancer". Denies fevers or chills. No shortness of breath. Over last 4 days she's had progressively difficult swallowing. Last night  she had a choking episode after eating rice. Went to the point that she almost stopped breathing and her husband had this lab on the back little times to get the rice out. She has not tried to eat or drink today because she is afraid. No trouble talking. It feels like there is a lump in her throat that she can't get out. A CT scan last week shows a 6 x 4 cm lung mass that is likely the cause of her cough.  Past Medical History  Diagnosis Date  . Cancer ([place])     5 years ago  . Pneumonia    History reviewed. No pertinent past surgical history. History reviewed. No pertinent family history. Social History  Substance Use Topics  . Smoking status: Never Smoker   . Smokeless tobacco: None  . Alcohol Use: No   OB History    No data available     Review of Systems  Constitutional: Negative for fever.  HENT: Positive for trouble swallowing. Negative for sore throat and voice change.   Respiratory: Positive for cough. Negative for shortness of breath.   Cardiovascular: Negative for chest pain.  Gastrointestinal: Negative for vomiting.  All other systems reviewed and are  negative.     Allergies  Review of patient's allergies indicates no known allergies.  Home Medications   Prior to Admission medications   Medication Sig Start Date End Date Taking? Authorizing Provider  AFLURIA PRESERVATIVE FREE 0.5 ML SUSY Inject 0.5 mLs into the muscle once. 06/04/15  Yes Historical Provider, MD  albuterol (PROVENTIL HFA;VENTOLIN HFA) 108 (90 BASE) MCG/ACT inhaler Inhale 1-2 puffs into the lungs every 6 (six) hours as needed for wheezing. 07/06/15  Yes Larene Pickett, PA-C  Calcium-Magnesium-Vitamin D (CALCIUM 500 PO) Take 500 mg by mouth 2 (two) times daily.   Yes Historical Provider, MD  chlorpheniramine-HYDROcodone (TUSSIONEX PENNKINETIC ER) 10-8 MG/5ML SUER Take 5 mLs by mouth every 12 (twelve) hours as needed for cough. 07/06/15  Yes Larene Pickett, PA-C  diphenhydrAMINE (BENADRYL) 25 MG tablet Take 1 tablet (25 mg total) by mouth at bedtime as needed for sleep. 07/15/15  Yes Shawn C Joy, PA-C  Multiple Vitamin (MULTIVITAMIN WITH MINERALS) TABS tablet Take 1 tablet by mouth daily.   Yes Historical Provider, MD  naproxen sodium (ANAPROX) 220 MG tablet Take 440 mg by mouth 2 (two) times daily as needed (pain).   Yes Historical Provider, MD  azithromycin (ZITHROMAX) 250 MG tablet Take 1 tablet (250 mg total) by mouth daily. Take first 2 tablets together, then 1 every day until finished. Patient not taking: Reported on 07/22/2015 07/06/15   Larene Pickett, PA-C  benzonatate (TESSALON) 100  MG capsule Take 1 capsule (100 mg total) by mouth every 8 (eight) hours. Patient not taking: Reported on 07/22/2015 07/06/15   Larene Pickett, PA-C  diazepam (VALIUM) 5 MG tablet Take 1 tablet (5 mg total) by mouth every 8 (eight) hours as needed for muscle spasms. 02/08/15   Christopher Lawyer, PA-C  ondansetron (ZOFRAN ODT) 4 MG disintegrating tablet Take 1 tablet (4 mg total) by mouth every 8 (eight) hours as needed for nausea or vomiting. 07/15/15   Shawn C Joy, PA-C  predniSONE  (DELTASONE) 50 MG tablet Take 1 tablet (50 mg total) by mouth daily with breakfast. 02/08/15   Dalia Heading, PA-C   BP 134/68 mmHg  Pulse 95  Temp(Src) 98.7 F (37.1 C) (Oral)  Resp 16  SpO2 100% Physical Exam  Constitutional: She is oriented to person, place, and time. She appears well-developed and well-nourished. No distress.  Speaks in full sentences without distress, no acute trouble breathing  HENT:  Head: Normocephalic and atraumatic.  Right Ear: External ear normal.  Left Ear: External ear normal.  Nose: Nose normal.  Oropharynx clear, normal tonsils and uvula. No tongue swelling  Eyes: Right eye exhibits no discharge. Left eye exhibits no discharge.  Neck: Neck supple.  No neck fullness/swelling or tenderness  Cardiovascular: Normal rate, regular rhythm and normal heart sounds.   Pulmonary/Chest: Effort normal and breath sounds normal.  Abdominal: Soft. There is no tenderness.  Lymphadenopathy:    She has no cervical adenopathy.  Neurological: She is alert and oriented to person, place, and time.  Skin: Skin is warm and dry. She is not diaphoretic.  Nursing note and vitals reviewed.   ED Course  Procedures (including critical care time) Labs Review Labs Reviewed  I-STAT CHEM 8, ED - Abnormal; Notable for the following:    Potassium 3.4 (*)    Glucose, Bld 111 (*)    Hemoglobin 11.6 (*)    HCT 34.0 (*)    All other components within normal limits    Imaging Review Ct Soft Tissue Neck W Contrast  07/22/2015  CLINICAL DATA:  Choking sensation. Mass sensation in the neck. Persistent cough for 3 weeks. EXAM: CT NECK WITH CONTRAST TECHNIQUE: Multidetector CT imaging of the neck was performed using the standard protocol following the bolus administration of intravenous contrast. CONTRAST:  51m OMNIPAQUE IOHEXOL 300 MG/ML  SOLN COMPARISON:  CT scan of the cervical spine dated 06/04/2008 and chest CT scan dated 07/12/2015 FINDINGS: Pharynx and larynx: There is  slight prominence of the tonsils bilaterally without evidence of a mass or abscess. Salivary glands: Normal. Thyroid: The left lobe of the thyroid gland is inhomogeneous. There is a least 1 nodule in the left lobe as well as a tiny nodule in the right lobe. Lymph nodes: Multiple small nodes in both sides of the neck. The patient does have multiple abnormal and large nodes in the superior mediastinum including enlargement of azygos node to a diameter 21 mm. 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 Limited intracranial: Normal. Visualized orbits: Normal. Mastoids and visualized paranasal sinuses: Normal. Skeleton: Normal. Upper chest: Superior mediastinal adenopathy and right lung mass as seen on the prior chest CT. IMPRESSION: 1. Slight prominence of the tonsils without an abscess or mass. 2. No adenopathy in the neck. 3. Superior mediastinal adenopathy. 4. Aberrant right subclavian artery. 5. Nodules in  the thyroid gland. Electronically Signed   By: Lorriane Shire M.D.   On: 07/22/2015 14:56   I have personally reviewed and evaluated these images and lab results as part of my medical decision-making.   EKG Interpretation None      MDM   Final diagnoses:  Lung mass  Cough    It is unclear why the patient feels like she has a lump in her throat. CT of her neck is unremarkable. She is able to drink in the ER without difficulty. Plan to treat her cough and have her follow-up with the oncologist tomorrow as scheduled. No signs of an acute infection. I believe her cough is from the mass and not a new pneumonia given stable vital signs, no fever, hypoxia, or increased work of breathing.    Sherwood Gambler, MD 07/22/15 [id number]

## 2015-07-22 NOTE — ED Notes (Addendum)
Pt reports non-productive cough that she was seen in ED for on 10/21 and 10/24, CT scan that showed a nodule on her lung. Pt has appointment with Dr Earlie Server tomorrow. Reports she has had difficulty swallowing x3 days.  Hx of cervical cancer 6 years ago. Denies pain. Able to speak in full sentences. Denies SOB.

## 2015-07-23 ENCOUNTER — Other Ambulatory Visit (HOSPITAL_BASED_OUTPATIENT_CLINIC_OR_DEPARTMENT_OTHER): Payer: Medicare Other

## 2015-07-23 ENCOUNTER — Ambulatory Visit (HOSPITAL_BASED_OUTPATIENT_CLINIC_OR_DEPARTMENT_OTHER): Payer: Medicare Other | Admitting: Internal Medicine

## 2015-07-23 ENCOUNTER — Telehealth: Payer: Self-pay | Admitting: Internal Medicine

## 2015-07-23 ENCOUNTER — Encounter: Payer: Self-pay | Admitting: Internal Medicine

## 2015-07-23 VITALS — BP 146/82 | HR 105 | Temp 98.6°F | Resp 17 | Ht 68.0 in | Wt 222.1 lb

## 2015-07-23 DIAGNOSIS — Z8541 Personal history of malignant neoplasm of cervix uteri: Secondary | ICD-10-CM

## 2015-07-23 DIAGNOSIS — R918 Other nonspecific abnormal finding of lung field: Secondary | ICD-10-CM

## 2015-07-23 LAB — CBC WITH DIFFERENTIAL/PLATELET
BASO%: 1.1 % (ref 0.0–2.0)
Basophils Absolute: 0.1 10*3/uL (ref 0.0–0.1)
EOS ABS: 0.1 10*3/uL (ref 0.0–0.5)
EOS%: 1.5 % (ref 0.0–7.0)
HEMATOCRIT: 33.2 % — AB (ref 34.8–46.6)
HEMOGLOBIN: 10.9 g/dL — AB (ref 11.6–15.9)
LYMPH%: 25.1 % (ref 14.0–49.7)
MCH: 29.4 pg (ref 25.1–34.0)
MCHC: 33 g/dL (ref 31.5–36.0)
MCV: 89 fL (ref 79.5–101.0)
MONO#: 0.5 10*3/uL (ref 0.1–0.9)
MONO%: 8 % (ref 0.0–14.0)
NEUT%: 64.3 % (ref 38.4–76.8)
NEUTROS ABS: 4.1 10*3/uL (ref 1.5–6.5)
PLATELETS: 452 10*3/uL — AB (ref 145–400)
RBC: 3.73 10*6/uL (ref 3.70–5.45)
RDW: 15.3 % — AB (ref 11.2–14.5)
WBC: 6.4 10*3/uL (ref 3.9–10.3)
lymph#: 1.6 10*3/uL (ref 0.9–3.3)

## 2015-07-23 LAB — COMPREHENSIVE METABOLIC PANEL (CC13)
ALBUMIN: 3.4 g/dL — AB (ref 3.5–5.0)
ALK PHOS: 88 U/L (ref 40–150)
ALT: 13 U/L (ref 0–55)
ANION GAP: 9 meq/L (ref 3–11)
AST: 21 U/L (ref 5–34)
BILIRUBIN TOTAL: 0.44 mg/dL (ref 0.20–1.20)
BUN: 9.8 mg/dL (ref 7.0–26.0)
CALCIUM: 9.9 mg/dL (ref 8.4–10.4)
CO2: 26 mEq/L (ref 22–29)
Chloride: 104 mEq/L (ref 98–109)
Creatinine: 0.9 mg/dL (ref 0.6–1.1)
EGFR: 70 mL/min/{1.73_m2} — AB (ref >90)
Glucose: 109 mg/dl (ref 70–140)
Potassium: 3.5 mEq/L (ref 3.5–5.1)
Sodium: 139 mEq/L (ref 136–145)
TOTAL PROTEIN: 8.6 g/dL — AB (ref 6.4–8.3)

## 2015-07-23 NOTE — Telephone Encounter (Signed)
Gave and printed avs

## 2015-07-23 NOTE — Progress Notes (Signed)
[place] [phone]   [phone]  CONSULT NOTE  REFERRING PHYSICIAN: Arlean Hopping, PA-C  REASON FOR CONSULTATION:  54 years old African-American female with questionable lung cancer.  HPI Theresa [name] is a 54 y.o. female with past medical history significant for stage I B2 cervical cancer diagnosed in October 2012 status post concurrent chemoradiation with weekly cisplatin completed 11/29/2012 with no evidence for disease recurrence. She was followed by [facility] at [facility] in [place]. The patient move back to [place] 4 years ago. She was seen by [facility] at regular basis for evaluation of her history of the cervical cancer. Over the last few weeks The patient mentions that she started having cold symptoms with cough and sneezing as well as chest congestion after cutting grass in September 2016. She was seen at the emergency department at [facility] at that time and was given prescription for antibiotics for 7 days with no improvement. On 07/12/2015 the patient was seen again at the emergency department at [facility] and CT angiogram of the chest was performed and it showed 6.1 x 4.1 cm mass in the posterior right upper lobe, with cut off of the right upper lobe bronchus, and cut off/effacement of the right middle lobe bronchus. Mass extends to the right hilar region. Secondary postobstructive opacity in the posterior right upper lobe with secondary right middle lobe collapse. There was also thoracic lymphadenopathy including 11 mm short axis node posterior to the trachea at the thoracic inlet, 14 mm short axis high right paratracheal node, 17 mm short axis low right paratracheal node, 23 mm short axis subcarinal node, 18 mm short axis interlobar right lower lobe node, less likely a pulmonary nodule. There was no evidence for pulmonary embolism. She was also seen yesterday at the emergency  department with choking sensation and had CT scan of the neck with contrast performed. It showed slight prominence of the tonsils without an abscess or mass and no adenopathy in the neck area but it showed the superior mediastinal adenopathy and nodules in the thyroid gland. The patient was referred to me today for further evaluation and recommendation regarding treatment of her condition. When seen today she is feeling fine except for the dry cough but she has no significant chest pain, shortness breath or hemoptysis. The patient lost few pounds recently because of concern about the new findings and lack of appetite. She has no night sweats. She denied having any significant nausea or vomiting. She denied having any headache or visual changes. Family history significant for a father who died from lung cancer at age 55. Mother still alive and has COPD and diabetes mellitus. The patient is single and was accompanied today by her [place]. She is a Automotive engineer. She has a history of smoking 1 pack per day for around 3 years but quit smoking 32 years ago. No history of alcohol or drug abuse.   HPI  Past Medical History  Diagnosis Date  . Cancer ([place])     5 years ago  . Pneumonia     History reviewed. No pertinent past surgical history.  History reviewed. No pertinent family history.  Social History Social History  Substance Use Topics  . Smoking status: Never Smoker   . Smokeless tobacco: None  . Alcohol Use: No    No Known Allergies  Current Outpatient Prescriptions  Medication Sig Dispense Refill  . AFLURIA PRESERVATIVE FREE 0.5  ML SUSY Inject 0.5 mLs into the muscle once.  0  . albuterol (PROVENTIL HFA;VENTOLIN HFA) 108 (90 BASE) MCG/ACT inhaler Inhale 1-2 puffs into the lungs every 6 (six) hours as needed for wheezing. 1 Inhaler 0  . Calcium-Magnesium-Vitamin D (CALCIUM 500 PO) Take 500 mg by mouth 2 (two) times daily.    . chlorpheniramine-HYDROcodone  (TUSSIONEX PENNKINETIC ER) 10-8 MG/5ML SUER Take 5 mLs by mouth every 12 (twelve) hours as needed for cough. 140 mL 0  . Multiple Vitamin (MULTIVITAMIN WITH MINERALS) TABS tablet Take 1 tablet by mouth daily.    . diphenhydrAMINE (BENADRYL) 25 MG tablet Take 1 tablet (25 mg total) by mouth at bedtime as needed for sleep. (Patient not taking: Reported on 07/23/2015) 20 tablet 0  . naproxen sodium (ANAPROX) 220 MG tablet Take 440 mg by mouth 2 (two) times daily as needed (pain).     No current facility-administered medications for this visit.    Review of Systems  Constitutional: positive for weight loss Eyes: negative Ears, nose, mouth, throat, and face: negative Respiratory: positive for cough Cardiovascular: negative Gastrointestinal: negative Genitourinary:negative Integument/breast: negative Hematologic/lymphatic: negative Musculoskeletal:negative Neurological: negative Behavioral/Psych: negative Endocrine: negative Allergic/Immunologic: negative  Physical Exam  [id number], healthy, no distress, well nourished, well developed and anxious SKIN: skin color, texture, turgor are normal, no rashes or significant lesions HEAD: Normocephalic EYES: normal, PERRLA, Conjunctiva are pink and non-injected EARS: External ears normal, Canals clear OROPHARYNX:no exudate, no erythema and lips, buccal mucosa, and tongue normal  NECK: supple, no adenopathy, no JVD LYMPH:  no palpable lymphadenopathy, no hepatosplenomegaly BREAST:not examined LUNGS: clear to auscultation , and palpation HEART: regular rate & rhythm, no murmurs and no gallops ABDOMEN:abdomen soft, non-tender, obese, normal bowel sounds and no masses or organomegaly BACK: Back symmetric, no curvature., No CVA tenderness EXTREMITIES:no joint deformities, effusion, or inflammation, no edema, no skin discoloration  NEURO: alert & oriented x 3 with fluent speech, no focal motor/sensory deficits  PERFORMANCE STATUS: ECOG  1  LABORATORY DATA: Lab Results  Component Value Date   WBC 6.4 07/23/2015   HGB 10.9* 07/23/2015   HCT 33.2* 07/23/2015   MCV 89.0 07/23/2015   PLT 452* 07/23/2015      Chemistry      Component Value Date/Time   NA 139 07/23/2015 1423   NA 139 07/22/2015 1401   K 3.5 07/23/2015 1423   K 3.4* 07/22/2015 1401   CL 101 07/22/2015 1401   CO2 26 07/23/2015 1423   CO2 26 07/15/2015 1145   BUN 9.8 07/23/2015 1423   BUN 7 07/22/2015 1401   CREATININE 0.9 07/23/2015 1423   CREATININE 0.70 07/22/2015 1401      Component Value Date/Time   CALCIUM 9.9 07/23/2015 1423   CALCIUM 9.6 07/15/2015 1145   ALKPHOS 88 07/23/2015 1423   ALKPHOS 76 07/15/2015 1145   AST 21 07/23/2015 1423   AST 20 07/15/2015 1145   ALT 13 07/23/2015 1423   ALT 14 07/15/2015 1145   BILITOT 0.44 07/23/2015 1423   BILITOT 0.7 07/15/2015 1145       RADIOGRAPHIC STUDIES: Dg Chest 2 View  07/15/2015  CLINICAL DATA:  Nonproductive cough subsequent evaluation EXAM: CHEST  2 VIEW COMPARISON:  07/06/2015 FINDINGS: Known right perihilar and upper lobe mass again identified. The opacity created by this on the chest radiograph is unchanged. Some mild peripheral opacities suggest post obstruction volume loss as seen on CT scan performed 07/12/2015. No pleural effusion. Left lung remains clear. IMPRESSION: Stable known  right sided mass and associated opacity. No change from 07/06/2015. Electronically Signed   By: Skipper Cliche M.D.   On: 07/15/2015 15:29   Dg Chest 2 View  07/06/2015  CLINICAL DATA:  Nonproductive cough for 3 weeks EXAM: CHEST  2 VIEW COMPARISON:  None. FINDINGS: Dense right middle lobe consolidation. There is an element of volume loss as there is shift of the mediastinum to the right and downward displacement of the minor fissure. Lungs otherwise clear. Normal heart size. No pneumothorax. IMPRESSION: Right middle lobe consolidation and an element of volume loss. Underlying malignancy cannot be  excluded. Followup PA and lateral chest X-ray is recommended in 3-4 weeks following trial of antibiotic therapy to ensure resolution and exclude underlying malignancy. Electronically Signed   By: Marybelle Killings M.D.   On: 07/06/2015 08:45   Ct Soft Tissue Neck W Contrast  07/22/2015  CLINICAL DATA:  Choking sensation. Mass sensation in the neck. Persistent cough for 3 weeks. EXAM: CT NECK WITH CONTRAST TECHNIQUE: Multidetector CT imaging of the neck was performed using the standard protocol following the bolus administration of intravenous contrast. CONTRAST:  50m OMNIPAQUE IOHEXOL 300 MG/ML  SOLN COMPARISON:  CT scan of the cervical spine dated 06/04/2008 and chest CT scan dated 07/12/2015 FINDINGS: Pharynx and larynx: There is slight prominence of the tonsils bilaterally without evidence of a mass or abscess. Salivary glands: Normal. Thyroid: The left lobe of the thyroid gland is inhomogeneous. There is a least 1 nodule in the left lobe as well as a tiny nodule in the right lobe. Lymph nodes: Multiple small nodes in both sides of the neck. The patient does have multiple abnormal and large nodes in the superior mediastinum including enlargement of azygos node to a diameter 21 mm. 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 Limited intracranial: Normal. Visualized orbits: Normal. Mastoids and visualized paranasal sinuses: Normal. Skeleton: Normal. Upper chest: Superior mediastinal adenopathy and right lung mass as seen on the prior chest CT. IMPRESSION: 1. Slight prominence of the tonsils without an abscess or mass. 2. No adenopathy in the neck. 3. Superior mediastinal adenopathy. 4. Aberrant right subclavian artery. 5. Nodules in the thyroid gland. Electronically Signed   By: JLorriane ShireM.D.   On: 07/22/2015 14:56   Ct Angio Chest Pe W/cm &/or Wo  Cm  07/12/2015  CLINICAL DATA:  Shortness of breath, tightness, cough. Abnormal chest radiograph with possible lung malignancy. EXAM: CT ANGIOGRAPHY CHEST WITH CONTRAST TECHNIQUE: Multidetector CT imaging of the chest was performed using the standard protocol during bolus administration of intravenous contrast. Multiplanar CT image reconstructions and MIPs were obtained to evaluate the vascular anatomy. CONTRAST:  1025mOMNIPAQUE IOHEXOL 350 MG/ML SOLN COMPARISON:  Chest radiographs dated 07/06/2015 FINDINGS: No evidence of pulmonary embolism. Branches of the right middle lobe pulmonary artery are diminutive/attenuated but patent. Mediastinum/Nodes: Heart is normal in size. No pericardial effusion. Thoracic lymphadenopathy, including: --11 mm short axis node posterior to the trachea at the thoracic inlet (series 6/ image 14) --14 mm short axis high right paratracheal node (series 6/ image 23) --17 mm short axis low right paratracheal node (series 6/ image 29) --23 mm short axis subcarinal node (series 6/ image 42) --18 mm short axis interlobar right lower lobe node (series 6/ image 48), less likely a pulmonary nodule Visualized thyroid is unremarkable. Lungs/Pleura: 6.1 x 4.1 cm  mass in the posterior right upper lobe (Series 6/ image 39), with cut off of the right upper lobe bronchus (series 6/ image 35), and cut off/effacement of the right middle lobe bronchus (series 6/ image 45). Mass extends to the right hilar region (series 6/image 45). Secondary postobstructive opacity in the posterior right upper lobe with secondary right middle lobe collapse. Additional patchy opacities in the peripheral right upper lobe (series 8/image 20) may also reflect postobstructive opacity versus lymphangitic spread. Nodular central right lower lobe opacities (series 8/ image 45) are favored to interlobar right lower lobe lymph nodes, less likely pulmonary nodules. Left lung is clear. No pleural effusion or pneumothorax. Upper  abdomen: Visualized upper abdomen is unremarkable. Musculoskeletal: Mild degenerative changes of the thoracic spine. Review of the MIP images confirms the above findings. IMPRESSION: No evidence of pulmonary embolism. 6.1 x 4.1 cm posterior right upper lobe mass extending to the right middle lobe and right hilar region. Associated postobstructive opacity in the right upper and middle lobes. Thoracic lymphadenopathy, measuring up to 2.3 cm short axis, as above. Electronically Signed   By: Julian Hy M.D.   On: 07/12/2015 23:55    ASSESSMENT: This is a very pleasant 54 years old African-American female with a remote history of few years of smoking but quit 32 years ago. The patient also has a history of a stage IB 2 cervical cancer status post concurrent chemoradiation with cisplatin completed in March 2014 with no evidence for disease recurrence. She is presenting today with large right upper lobe lung mass with postobstructive consolidation as well as extensive mediastinal lymphadenopathy, highly suspicious for primary lung carcinoma.   PLAN: I had a lengthy discussion with the patient and her fianc today about her current condition and further investigation to confirm a diagnosis. I recommended for the patient to have a PET scan as well as MRI of the brain to complete the staging workup of her disease. I will also arrange for the patient to come to the multidisciplinary thoracic oncology clinic after her imaging studies for evaluation and consideration of tissue diagnosis with bronchoscopy with endobronchial ultrasound plus/minus mediastinoscopy as well as discussion of her treatment options. The patient agreed to the current plan. She was seen by thoracic navigator during this visit. She will continue with her current cough medicine for now. The patient was advised to call immediately if she has any concerning symptoms in the interval. The patient voices understanding of current disease status  and treatment options and is in agreement with the current care plan.  All questions were answered. The patient knows to call the clinic with any problems, questions or concerns. We can certainly see the patient much sooner if necessary.  Thank you so much for allowing me to participate in the care of Theresa [name]. I will continue to follow up the patient with you and assist in her care.  I spent 40 minutes counseling the patient face to face. The total time spent in the appointment was 60 minutes.  Disclaimer: This note was dictated with voice recognition software. Similar sounding words can inadvertently be transcribed and may not be corrected upon review.   Allea Kassner K. July 23, 2015, 3:21 PM

## 2015-07-26 ENCOUNTER — Telehealth: Payer: Self-pay | Admitting: *Deleted

## 2015-07-26 DIAGNOSIS — R918 Other nonspecific abnormal finding of lung field: Secondary | ICD-10-CM

## 2015-07-26 NOTE — Telephone Encounter (Addendum)
Will review with MD. Per MD pt to eat small frequent meals. Notified pt. Pt verbalized understanding, inquired about biopsy appt. No appt at this time in system. No further concerns.

## 2015-07-26 NOTE — Telephone Encounter (Signed)
Patient states she would like something to help increase her appetite. Message to [facility].

## 2015-07-26 NOTE — Telephone Encounter (Signed)
Oncology Nurse Navigator Documentation  Oncology Nurse Navigator Flowsheets 07/26/2015  Navigator Encounter Type Telephone/I noticed PET and MRI Brain have been scheduled for 08/07/15.  I called patient to schedule for [facility] on 08/08/15 arrive at 2:30.  Patient verbalized understanding of appt time and place.    Patient Visit Type Follow-up  Treatment Phase Abnormal Scans  Interventions Coordination of Care  Coordination of Care MD Appointments  Time Spent with Patient 15

## 2015-07-30 ENCOUNTER — Other Ambulatory Visit: Payer: Self-pay | Admitting: *Deleted

## 2015-07-30 ENCOUNTER — Institutional Professional Consult (permissible substitution) (INDEPENDENT_AMBULATORY_CARE_PROVIDER_SITE_OTHER): Payer: Medicare Other | Admitting: Thoracic Surgery (Cardiothoracic Vascular Surgery)

## 2015-07-30 ENCOUNTER — Encounter: Payer: Self-pay | Admitting: Thoracic Surgery (Cardiothoracic Vascular Surgery)

## 2015-07-30 VITALS — BP 139/85 | HR 109 | Resp 16 | Ht 68.0 in | Wt 225.0 lb

## 2015-07-30 DIAGNOSIS — R918 Other nonspecific abnormal finding of lung field: Secondary | ICD-10-CM

## 2015-07-30 DIAGNOSIS — R599 Enlarged lymph nodes, unspecified: Secondary | ICD-10-CM

## 2015-07-30 DIAGNOSIS — R590 Localized enlarged lymph nodes: Secondary | ICD-10-CM

## 2015-07-30 NOTE — Progress Notes (Signed)
PCP is No PCP Per Patient Referring Provider is Curt Bears, MD  Chief Complaint  Patient presents with  . Lung Mass    right upper lobe extending into the middle lobe, hilar mass.Marland KitchenMarland KitchenCTA CHEST 07/12/15...requesting biopsy  . Adenopathy    mediastinal    HPI: Theresa [name] is a 54 year old woman with a history of light tobacco abuse many years ago. She also has a history of cervical cancer 5 years ago which has been treated with no evidence of recurrence. She presented with a persistent cough and was present for about a month. She also felt congested. When the cough would not resolve she finally went to her doctor and was treated with empiric antibiotics for possible pneumonia. She felt a little better that after that, but her cough persisted. A CT chest was done which showed a 6.1 x 4.1 cm right hilar mass and hilar and mediastinal adenopathy.  She was referred to Dr. Julien Nordmann. He felt this was suspicious for lung cancer and now refers her for biopsy. She is scheduled to have an MR of the brain and a PET/CT later this month.   Continues to have a cough. It is nonproductive. She denies hemoptysis. She's lost 5-6 pounds over the past 3 months. Her appetite is poor. She denies chest pain, pressure, or tightness. She has not had any unusual headaches or visual changes. She does not have any new or unusual bone or joint pain.  Zubrod Score: At the time of surgery this patient's most appropriate activity status/level should be described as: '[x]'$     0    Normal activity, no symptoms '[]'$     1    Restricted in physical strenuous activity but ambulatory, able to do out light work '[]'$     2    Ambulatory and capable of self care, unable to do work activities, up and about >50 % of waking hours                              '[]'$     3    Only limited self care, in bed greater than 50% of waking hours '[]'$     4    Completely disabled, no self care, confined to bed or chair '[]'$     5    Moribund  Past Medical  History  Diagnosis Date  . Cancer ([place])     5 years ago  . Pneumonia     No past surgical history on file.  Family history  Father died of lung cancer at age 5 Mother alive with diabetes and COPD  Social History Smoked less than 1 pack of cigarettes daily for proximally 5 years. Quit 25 years ago. 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 Calcium Carb-Cholecalciferol (CALCIUM 600 + D) 600-200 MG-UNIT TABS Take 2 capsules by mouth daily.    . Multiple Vitamin (MULTIVITAMIN WITH MINERALS) TABS tablet Take 1 tablet by mouth daily.    . naproxen sodium (ANAPROX) 220 MG tablet Take 440 mg by mouth 2 (two) times daily as needed (pain).     No current facility-administered medications for this visit.    No Known Allergies  Review of Systems  Constitutional: Positive for appetite change, fatigue and unexpected weight change (5-6 pounds over past 3 months). Negative for fever, chills and activity change.  HENT: Positive  for congestion and dental problem.   Eyes: Negative for visual disturbance.  Respiratory: Positive for cough and choking. Negative for wheezing.   Cardiovascular: Negative for chest pain and leg swelling.  Gastrointestinal: Negative for abdominal pain and blood in stool.  Genitourinary: Negative for hematuria.       History cervical cancer  Musculoskeletal: Negative.   Neurological: Negative for dizziness, syncope, speech difficulty and weakness.  Hematological: Negative for adenopathy. Does not bruise/bleed easily.  All other systems reviewed and are negative.   BP 139/85 mmHg  Pulse 109  Resp 16  Ht '5\' 8"'$  (1.727 m)  Wt 225 lb (102.059 kg)  BMI 34.22 kg/m2  SpO2 98% Physical Exam  Constitutional: She is oriented to person, place, and time. She appears well-developed. No distress.  Obese  HENT:  Head: Normocephalic and atraumatic.  Mouth/Throat:  No oropharyngeal exudate.  Poor dentition  Eyes: Conjunctivae and EOM are normal. Pupils are equal, round, and reactive to light. No scleral icterus.  Neck: Neck supple. No tracheal deviation present. No thyromegaly present.  Cardiovascular: Normal rate, regular rhythm, normal heart sounds and intact distal pulses.  Exam reveals no gallop and no friction rub.   No murmur heard. Pulmonary/Chest: Effort normal and breath sounds normal. No respiratory distress. She has no wheezes. She has no rales.  Abdominal: Soft. There is no tenderness.  Musculoskeletal: She exhibits no edema.  Lymphadenopathy:    She has no cervical adenopathy.  Neurological: She is alert and oriented to person, place, and time. No cranial nerve deficit.  Motor 5 out of 5 bilaterally  Skin: Skin is warm and dry.  Vitals reviewed.    Diagnostic Tests: CT ANGIOGRAPHY CHEST WITH CONTRAST  TECHNIQUE: Multidetector CT imaging of the chest was performed using the standard protocol during bolus administration of intravenous contrast. Multiplanar CT image reconstructions and MIPs were obtained to evaluate the vascular anatomy.  CONTRAST: 159m OMNIPAQUE IOHEXOL 350 MG/ML SOLN  COMPARISON: Chest radiographs dated 07/06/2015  FINDINGS: No evidence of pulmonary embolism. Branches of the right middle lobe pulmonary artery are diminutive/attenuated but patent.  Mediastinum/Nodes: Heart is normal in size. No pericardial effusion.  Thoracic lymphadenopathy, including:  --11 mm short axis node posterior to the trachea at the thoracic inlet (series 6/ image 14)  --14 mm short axis high right paratracheal node (series 6/ image 23)  --17 mm short axis low right paratracheal node (series 6/ image 29)  --23 mm short axis subcarinal node (series 6/ image 42)  --18 mm short axis interlobar right lower lobe node (series 6/ image 48), less likely a pulmonary nodule  Visualized thyroid is  unremarkable.  Lungs/Pleura: 6.1 x 4.1 cm mass in the posterior right upper lobe (Series 6/ image 39), with cut off of the right upper lobe bronchus (series 6/ image 35), and cut off/effacement of the right middle lobe bronchus (series 6/ image 45). Mass extends to the right hilar region (series 6/image 45).  Secondary postobstructive opacity in the posterior right upper lobe with secondary right middle lobe collapse.  Additional patchy opacities in the peripheral right upper lobe (series 8/image 20) may also reflect postobstructive opacity versus lymphangitic spread.  Nodular central right lower lobe opacities (series 8/ image 45) are favored to interlobar right lower lobe lymph nodes, less likely pulmonary nodules.  Left lung is clear.  No pleural effusion or pneumothorax.  Upper abdomen: Visualized upper abdomen is unremarkable.  Musculoskeletal: Mild degenerative changes of the thoracic spine.  Review of the MIP images  confirms the above findings.  IMPRESSION: No evidence of pulmonary embolism.  6.1 x 4.1 cm posterior right upper lobe mass extending to the right middle lobe and right hilar region. Associated postobstructive opacity in the right upper and middle lobes.  Thoracic lymphadenopathy, measuring up to 2.3 cm short axis, as above.   Electronically Signed  By: Julian Hy M.D.  On: 07/12/2015 23:55      I personally reviewed the CT chest and concur with the findings as noted above. She has a large right upper lobe mass with probable extension into the mediastinum and extensive lymphadenopathy.  Impression: 54 year old woman with a past history of light tobacco abuse but quit about 30 years ago who presented with a cough and congestion. Workup shows a 6.1 x 4.1 cm right upper lobe mass with likely direct extension to the mediastinum and hilar and mediastinal adenopathy. This is highly suspicious for stage III lung cancer. She needs a  biopsy for definitive diagnosis.  I recommended that we proceed with bronchoscopy and endobronchial ultrasound under general anesthesia. She understands this is a diagnostic, and not a therapeutic, procedure. I surrounded the general nature of the procedure. She understands this is an endoscopic procedure done on an outpatient basis. We will do it under general anesthesia to allow for adequate sampling to ensure the best chance of making a diagnosis. She does understand there is no guarantee we will be able to make a diagnosis. I discussed the indications, risks, benefits, and alternatives with her. She understands the risk include, but are not limited to death, MI, stroke, bleeding, pneumothorax, respiratory failure, aspiration, as well as the possibility of other unforeseeable complications.  She accepts the risks and wishes to proceed.  Plan: Bronchoscopy and endobronchial ultrasound on Friday, November 11. We will do this as an outpatient.  I spent 45 minutes with Ms. Noxon during this visit, > 50% was spent in counseling [name], MD [facility] [phone]

## 2015-08-01 ENCOUNTER — Encounter (HOSPITAL_COMMUNITY)
Admission: RE | Admit: 2015-08-01 | Discharge: 2015-08-01 | Disposition: A | Discharge status: Home / Self Care | Payer: Medicare Other | Source: Ambulatory Visit | Attending: Thoracic Surgery (Cardiothoracic Vascular Surgery) | Admitting: Thoracic Surgery (Cardiothoracic Vascular Surgery)

## 2015-08-01 ENCOUNTER — Other Ambulatory Visit (HOSPITAL_COMMUNITY): Payer: Self-pay | Admitting: *Deleted

## 2015-08-01 ENCOUNTER — Encounter (HOSPITAL_COMMUNITY): Payer: Self-pay

## 2015-08-01 ENCOUNTER — Encounter: Payer: Self-pay | Admitting: *Deleted

## 2015-08-01 VITALS — BP 131/87 | HR 112 | Resp 20 | Ht 68.0 in | Wt 218.1 lb

## 2015-08-01 DIAGNOSIS — C3411 Malignant neoplasm of upper lobe, right bronchus or lung: Secondary | ICD-10-CM | POA: Diagnosis not present

## 2015-08-01 DIAGNOSIS — Z79899 Other long term (current) drug therapy: Secondary | ICD-10-CM | POA: Diagnosis not present

## 2015-08-01 DIAGNOSIS — C771 Secondary and unspecified malignant neoplasm of intrathoracic lymph nodes: Secondary | ICD-10-CM | POA: Diagnosis not present

## 2015-08-01 DIAGNOSIS — R590 Localized enlarged lymph nodes: Secondary | ICD-10-CM

## 2015-08-01 DIAGNOSIS — R918 Other nonspecific abnormal finding of lung field: Secondary | ICD-10-CM

## 2015-08-01 DIAGNOSIS — Z801 Family history of malignant neoplasm of trachea, bronchus and lung: Secondary | ICD-10-CM | POA: Diagnosis not present

## 2015-08-01 DIAGNOSIS — Z87891 Personal history of nicotine dependence: Secondary | ICD-10-CM | POA: Diagnosis not present

## 2015-08-01 DIAGNOSIS — Z8541 Personal history of malignant neoplasm of cervix uteri: Secondary | ICD-10-CM | POA: Diagnosis not present

## 2015-08-01 DIAGNOSIS — Z791 Long term (current) use of non-steroidal anti-inflammatories (NSAID): Secondary | ICD-10-CM | POA: Diagnosis not present

## 2015-08-01 DIAGNOSIS — R591 Generalized enlarged lymph nodes: Secondary | ICD-10-CM | POA: Diagnosis not present

## 2015-08-01 LAB — COMPREHENSIVE METABOLIC PANEL
ALT: 15 U/L (ref 14–54)
ANION GAP: 9 (ref 5–15)
AST: 26 U/L (ref 15–41)
Albumin: 3.3 g/dL — ABNORMAL LOW (ref 3.5–5.0)
Alkaline Phosphatase: 78 U/L (ref 38–126)
BILIRUBIN TOTAL: 0.5 mg/dL (ref 0.3–1.2)
BUN: 7 mg/dL (ref 6–20)
CHLORIDE: 103 mmol/L (ref 101–111)
CO2: 25 mmol/L (ref 22–32)
Calcium: 9.8 mg/dL (ref 8.9–10.3)
Creatinine, Ser: 0.89 mg/dL (ref 0.44–1.00)
Glucose, Bld: 140 mg/dL — ABNORMAL HIGH (ref 65–99)
POTASSIUM: 3.2 mmol/L — AB (ref 3.5–5.1)
Sodium: 137 mmol/L (ref 135–145)
TOTAL PROTEIN: 8.7 g/dL — AB (ref 6.5–8.1)

## 2015-08-01 LAB — CBC
HEMATOCRIT: 34.3 % — AB (ref 36.0–46.0)
Hemoglobin: 11.5 g/dL — ABNORMAL LOW (ref 12.0–15.0)
MCH: 29 pg (ref 26.0–34.0)
MCHC: 33.5 g/dL (ref 30.0–36.0)
MCV: 86.6 fL (ref 78.0–100.0)
PLATELETS: 440 10*3/uL — AB (ref 150–400)
RBC: 3.96 MIL/uL (ref 3.87–5.11)
RDW: 14.9 % (ref 11.5–15.5)
WBC: 5.4 10*3/uL (ref 4.0–10.5)

## 2015-08-01 LAB — APTT: aPTT: 32 seconds (ref 24–37)

## 2015-08-01 LAB — PROTIME-INR
INR: 1.18 (ref 0.00–1.49)
PROTHROMBIN TIME: 15.2 s (ref 11.6–15.2)

## 2015-08-01 NOTE — Pre-Procedure Instructions (Signed)
Theresa [name]  08/01/2015     Your procedure is scheduled on Friday, August 02, 2015 at 7:30 AM.   Report to [facility] Entrance "A" Admitting Office at 5:30 AM.   Call this number if you have problems the morning of surgery: [phone]     Remember:  Do not eat food or drink liquids after midnight.    Do not wear jewelry, make-up or nail polish.  Do not wear lotions, powders, or perfumes.  You may wear deodorant.  Do not shave 48 hours prior to surgery.    Do not bring valuables to the hospital.  [facility] is not responsible for any belongings or valuables.  Contacts, dentures or bridgework may not be worn into surgery.  Leave your suitcase in the car.  After surgery it may be brought to your room.  For patients admitted to the hospital, discharge time will be determined by your treatment team.  Patients discharged the day of surgery will not be allowed to drive home.   Special instructions:  [name] - Preparing for Surgery  Before surgery, you can play an important role.  Because skin is not sterile, your skin needs to be as free of germs as possible.  You can reduce the number of germs on you skin by washing with CHG (chlorahexidine gluconate) soap before surgery.  CHG is an antiseptic cleaner which kills germs and bonds with the skin to continue killing germs even after washing.  Please DO NOT use if you have an allergy to CHG or antibacterial soaps.  If your skin becomes reddened/irritated stop using the CHG and inform your nurse when you arrive at Short Stay.  Do not shave (including legs and underarms) for at least 48 hours prior to the first CHG shower.  You may shave your face.  Please follow these instructions carefully:   1.  Shower with CHG Soap the night before surgery and the                                morning of Surgery.  2.  If you choose to wash your hair, wash your hair first as usual with your       normal shampoo.  3.  After you  shampoo, rinse your hair and body thoroughly to remove the                      Shampoo.  4.  Use CHG as you would any other liquid soap.  You can apply chg directly       to the skin and wash gently with scrungie or a clean washcloth.  5.  Apply the CHG Soap to your body ONLY FROM THE NECK DOWN.        Do not use on open wounds or open sores.  Avoid contact with your eyes, ears, mouth and genitals (private parts).  Wash genitals (private parts) with your normal soap.  6.  Wash thoroughly, paying special attention to the area where your surgery        will be performed.  7.  Thoroughly rinse your body with warm water from the neck down.  8.  DO NOT shower/wash with your normal soap after using and rinsing off       the CHG Soap.  9.  Pat yourself dry with a clean towel.  10.  Wear clean pajamas.            11.  Place clean sheets on your bed the night of your first shower and do not        sleep with pets.  Day of Surgery  Do not apply any lotions the morning of surgery.  Please wear clean clothes to the hospital.   Please read over the following fact sheets that you were given. Pain Booklet, Coughing and Deep Breathing and Surgical Site Infection Prevention

## 2015-08-01 NOTE — Progress Notes (Signed)
Pt denies cardiac history, chest pain or sob. 

## 2015-08-02 ENCOUNTER — Ambulatory Visit (HOSPITAL_COMMUNITY): Payer: Medicare Other | Admitting: Certified Registered Nurse Anesthetist

## 2015-08-02 ENCOUNTER — Encounter (HOSPITAL_COMMUNITY): Payer: Self-pay | Admitting: Surgery

## 2015-08-02 ENCOUNTER — Ambulatory Visit (HOSPITAL_COMMUNITY)
Admission: RE | Admit: 2015-08-02 | Discharge: 2015-08-02 | Disposition: A | Discharge status: Home / Self Care | Payer: Medicare Other | Source: Ambulatory Visit | Attending: Thoracic Surgery (Cardiothoracic Vascular Surgery) | Admitting: Thoracic Surgery (Cardiothoracic Vascular Surgery)

## 2015-08-02 ENCOUNTER — Encounter (HOSPITAL_COMMUNITY)
Admission: RE | Disposition: A | Discharge status: Home / Self Care | Payer: Self-pay | Source: Ambulatory Visit | Attending: Thoracic Surgery (Cardiothoracic Vascular Surgery)

## 2015-08-02 DIAGNOSIS — R918 Other nonspecific abnormal finding of lung field: Secondary | ICD-10-CM | POA: Diagnosis not present

## 2015-08-02 DIAGNOSIS — Z801 Family history of malignant neoplasm of trachea, bronchus and lung: Secondary | ICD-10-CM | POA: Insufficient documentation

## 2015-08-02 DIAGNOSIS — R590 Localized enlarged lymph nodes: Secondary | ICD-10-CM | POA: Diagnosis not present

## 2015-08-02 DIAGNOSIS — R222 Localized swelling, mass and lump, trunk: Secondary | ICD-10-CM | POA: Diagnosis not present

## 2015-08-02 DIAGNOSIS — Z79899 Other long term (current) drug therapy: Secondary | ICD-10-CM | POA: Diagnosis not present

## 2015-08-02 DIAGNOSIS — C801 Malignant (primary) neoplasm, unspecified: Secondary | ICD-10-CM | POA: Diagnosis not present

## 2015-08-02 DIAGNOSIS — Z8541 Personal history of malignant neoplasm of cervix uteri: Secondary | ICD-10-CM | POA: Diagnosis not present

## 2015-08-02 DIAGNOSIS — Z87891 Personal history of nicotine dependence: Secondary | ICD-10-CM | POA: Insufficient documentation

## 2015-08-02 DIAGNOSIS — C3411 Malignant neoplasm of upper lobe, right bronchus or lung: Secondary | ICD-10-CM | POA: Diagnosis not present

## 2015-08-02 DIAGNOSIS — Z791 Long term (current) use of non-steroidal anti-inflammatories (NSAID): Secondary | ICD-10-CM | POA: Insufficient documentation

## 2015-08-02 DIAGNOSIS — C771 Secondary and unspecified malignant neoplasm of intrathoracic lymph nodes: Secondary | ICD-10-CM | POA: Diagnosis not present

## 2015-08-02 HISTORY — PX: VIDEO BRONCHOSCOPY WITH ENDOBRONCHIAL ULTRASOUND: SHX6177

## 2015-08-02 SURGERY — BRONCHOSCOPY, WITH EBUS
Anesthesia: General

## 2015-08-02 MED ORDER — EPINEPHRINE HCL 1 MG/ML IJ SOLN
INTRAMUSCULAR | Status: DC | PRN
  Administered 2015-08-02: 1 mg via ENDOTRACHEOPULMONARY

## 2015-08-02 MED ORDER — NEOSTIGMINE METHYLSULFATE 10 MG/10ML IV SOLN
INTRAVENOUS | Status: DC | PRN
  Administered 2015-08-02: 4 mg via INTRAVENOUS

## 2015-08-02 MED ORDER — OXYCODONE HCL 5 MG PO TABS: 5.0000 mg | ORAL_TABLET | Freq: Once | ORAL | Status: DC | PRN

## 2015-08-02 MED ORDER — ONDANSETRON HCL 4 MG/2ML IJ SOLN
4.0000 mg | Freq: Once | INTRAMUSCULAR | Status: DC | PRN
Start: 2015-08-02 — End: 2015-08-02

## 2015-08-02 MED ORDER — PHENYLEPHRINE HCL 10 MG/ML IJ SOLN
INTRAMUSCULAR | Status: DC | PRN
  Administered 2015-08-02: 40 ug via INTRAVENOUS

## 2015-08-02 MED ORDER — MIDAZOLAM HCL 5 MG/5ML IJ SOLN
INTRAMUSCULAR | Status: DC | PRN
  Administered 2015-08-02: 2 mg via INTRAVENOUS

## 2015-08-02 MED ORDER — LIDOCAINE HCL (CARDIAC) 20 MG/ML IV SOLN
INTRAVENOUS | Status: DC | PRN
  Administered 2015-08-02: 50 mg via INTRAVENOUS

## 2015-08-02 MED ORDER — PROPOFOL 10 MG/ML IV BOLUS
INTRAVENOUS | Status: AC
  Filled 2015-08-02: qty 20

## 2015-08-02 MED ORDER — ONDANSETRON HCL 4 MG/2ML IJ SOLN
INTRAMUSCULAR | Status: DC | PRN
  Administered 2015-08-02: 4 mg via INTRAVENOUS

## 2015-08-02 MED ORDER — ROCURONIUM BROMIDE 100 MG/10ML IV SOLN
INTRAVENOUS | Status: DC | PRN
Start: 2015-08-02 — End: 2015-08-02
  Administered 2015-08-02: 50 mg via INTRAVENOUS

## 2015-08-02 MED ORDER — FENTANYL CITRATE (PF) 100 MCG/2ML IJ SOLN
INTRAMUSCULAR | Status: DC | PRN
  Administered 2015-08-02 (×3): 50 ug via INTRAVENOUS
  Administered 2015-08-02: 100 ug via INTRAVENOUS

## 2015-08-02 MED ORDER — OXYCODONE HCL 5 MG/5ML PO SOLN: 5.0000 mg | Freq: Once | ORAL | Status: DC | PRN

## 2015-08-02 MED ORDER — FENTANYL CITRATE (PF) 100 MCG/2ML IJ SOLN: 25.0000 ug | INTRAMUSCULAR | Status: DC | PRN

## 2015-08-02 MED ORDER — MIDAZOLAM HCL 2 MG/2ML IJ SOLN
INTRAMUSCULAR | Status: AC
  Filled 2015-08-02: qty 4

## 2015-08-02 MED ORDER — LACTATED RINGERS IV SOLN
INTRAVENOUS | Status: DC | PRN
  Administered 2015-08-02: 07:00:00 via INTRAVENOUS

## 2015-08-02 MED ORDER — PROPOFOL 10 MG/ML IV BOLUS
INTRAVENOUS | Status: DC | PRN
  Administered 2015-08-02: 160 mg via INTRAVENOUS

## 2015-08-02 MED ORDER — DEXAMETHASONE SODIUM PHOSPHATE 10 MG/ML IJ SOLN
INTRAMUSCULAR | Status: DC | PRN
  Administered 2015-08-02: 10 mg via INTRAVENOUS

## 2015-08-02 MED ORDER — EPINEPHRINE HCL 1 MG/ML IJ SOLN
INTRAMUSCULAR | Status: AC
  Filled 2015-08-02: qty 1

## 2015-08-02 MED ORDER — FENTANYL CITRATE (PF) 250 MCG/5ML IJ SOLN
INTRAMUSCULAR | Status: AC
  Filled 2015-08-02: qty 5

## 2015-08-02 MED ORDER — MIDAZOLAM HCL 10 MG/2ML IJ SOLN
INTRAMUSCULAR | Status: AC
  Filled 2015-08-02: qty 4

## 2015-08-02 MED ORDER — PROPOFOL 500 MG/50ML IV EMUL
INTRAVENOUS | Status: DC | PRN
  Administered 2015-08-02: 50 ug/kg/min via INTRAVENOUS

## 2015-08-02 MED ORDER — GLYCOPYRROLATE 0.2 MG/ML IJ SOLN
INTRAMUSCULAR | Status: DC | PRN
  Administered 2015-08-02: 0.6 mg via INTRAVENOUS

## 2015-08-02 MED ORDER — 0.9 % SODIUM CHLORIDE (POUR BTL) OPTIME
TOPICAL | Status: DC | PRN
  Administered 2015-08-02: 1000 mL

## 2015-08-02 SURGICAL SUPPLY — 36 items
BALL CTTN LRG ABS STRL LF (GAUZE/BANDAGES/DRESSINGS)
BRUSH CYTOL CELLEBRITY 1.5X140 (MISCELLANEOUS) ×2 IMPLANT
CANISTER SUCTION 2500CC (MISCELLANEOUS) ×3 IMPLANT
CONT SPEC 4OZ CLIKSEAL STRL BL (MISCELLANEOUS) ×3 IMPLANT
COTTONBALL LRG STERILE PKG (GAUZE/BANDAGES/DRESSINGS) IMPLANT
COVER DOME SNAP 22 D (MISCELLANEOUS) ×3 IMPLANT
COVER TABLE BACK 60X90 (DRAPES) ×3 IMPLANT
FILTER STRAW FLUID ASPIR (MISCELLANEOUS) ×2 IMPLANT
FORCEPS BIOP RJ4 1.8 (CUTTING FORCEPS) IMPLANT
GAUZE SPONGE 4X4 12PLY STRL (GAUZE/BANDAGES/DRESSINGS) IMPLANT
GLOVE SURG SIGNA 7.5 PF LTX (GLOVE) ×3 IMPLANT
GOWN STRL REUS W/ TWL XL LVL3 (GOWN DISPOSABLE) ×1 IMPLANT
GOWN STRL REUS W/TWL XL LVL3 (GOWN DISPOSABLE) ×3
KIT CLEAN ENDO COMPLIANCE (KITS) ×6 IMPLANT
KIT ROOM TURNOVER OR (KITS) ×3 IMPLANT
MARKER SKIN DUAL TIP RULER LAB (MISCELLANEOUS) ×3 IMPLANT
NDL BIOPSY TRANSBRONCH 21G (NEEDLE) IMPLANT
NDL BLUNT 18X1 FOR OR ONLY (NEEDLE) IMPLANT
NDL ECHOTIP HI DEF 22GA (NEEDLE) IMPLANT
NEEDLE 22X1 1/2 (OR ONLY) (NEEDLE) IMPLANT
NEEDLE BIOPSY TRANSBRONCH 21G (NEEDLE) ×3 IMPLANT
NEEDLE BLUNT 18X1 FOR OR ONLY (NEEDLE) IMPLANT
NEEDLE ECHOTIP HI DEF 22GA (NEEDLE) ×3 IMPLANT
NEEDLE SONO TIP II EBUS (NEEDLE) ×3 IMPLANT
NS IRRIG 1000ML POUR BTL (IV SOLUTION) ×3 IMPLANT
OIL SILICONE PENTAX (PARTS (SERVICE/REPAIRS)) IMPLANT
PAD ARMBOARD 7.5X6 YLW CONV (MISCELLANEOUS) ×6 IMPLANT
SYR 20CC LL (SYRINGE) ×3 IMPLANT
SYR 20ML ECCENTRIC (SYRINGE) ×3 IMPLANT
SYR 5ML LL (SYRINGE) ×3 IMPLANT
SYR 5ML LUER SLIP (SYRINGE) ×3 IMPLANT
SYR CONTROL 10ML LL (SYRINGE) IMPLANT
TOWEL OR 17X24 6PK STRL BLUE (TOWEL DISPOSABLE) ×3 IMPLANT
TRAP SPECIMEN MUCOUS 40CC (MISCELLANEOUS) ×3 IMPLANT
TUBE CONNECTING 20'X1/4 (TUBING) ×1
TUBE CONNECTING 20X1/4 (TUBING) ×2 IMPLANT

## 2015-08-02 NOTE — H&P (View-Only) (Signed)
PCP is No PCP Per Patient Referring Provider is Curt Bears, MD  Chief Complaint  Patient presents with  . Lung Mass    right upper lobe extending into the middle lobe, hilar mass.Marland KitchenMarland KitchenCTA CHEST 07/12/15...requesting biopsy  . Adenopathy    mediastinal    HPI: Theresa [name] is a 54 year old woman with a history of light tobacco abuse many years ago. She also has a history of cervical cancer 5 years ago which has been treated with no evidence of recurrence. She presented with a persistent cough and was present for about a month. She also felt congested. When the cough would not resolve she finally went to her doctor and was treated with empiric antibiotics for possible pneumonia. She felt a little better that after that, but her cough persisted. A CT chest was done which showed a 6.1 x 4.1 cm right hilar mass and hilar and mediastinal adenopathy.  She was referred to Dr. Julien Nordmann. He felt this was suspicious for lung cancer and now refers her for biopsy. She is scheduled to have an MR of the brain and a PET/CT later this month.   Continues to have a cough. It is nonproductive. She denies hemoptysis. She's lost 5-6 pounds over the past 3 months. Her appetite is poor. She denies chest pain, pressure, or tightness. She has not had any unusual headaches or visual changes. She does not have any new or unusual bone or joint pain.  Zubrod Score: At the time of surgery this patient's most appropriate activity status/level should be described as: '[x]'$     0    Normal activity, no symptoms '[]'$     1    Restricted in physical strenuous activity but ambulatory, able to do out light work '[]'$     2    Ambulatory and capable of self care, unable to do work activities, up and about >50 % of waking hours                              '[]'$     3    Only limited self care, in bed greater than 50% of waking hours '[]'$     4    Completely disabled, no self care, confined to bed or chair '[]'$     5    Moribund  Past Medical  History  Diagnosis Date  . Cancer ([place])     5 years ago  . Pneumonia     No past surgical history on file.  Family history  Father died of lung cancer at age 34 Mother alive with diabetes and COPD  Social History Smoked less than 1 pack of cigarettes daily for proximally 5 years. Quit 25 years ago. 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 Calcium Carb-Cholecalciferol (CALCIUM 600 + D) 600-200 MG-UNIT TABS Take 2 capsules by mouth daily.    . Multiple Vitamin (MULTIVITAMIN WITH MINERALS) TABS tablet Take 1 tablet by mouth daily.    . naproxen sodium (ANAPROX) 220 MG tablet Take 440 mg by mouth 2 (two) times daily as needed (pain).     No current facility-administered medications for this visit.    No Known Allergies  Review of Systems  Constitutional: Positive for appetite change, fatigue and unexpected weight change (5-6 pounds over past 3 months). Negative for fever, chills and activity change.  HENT: Positive  for congestion and dental problem.   Eyes: Negative for visual disturbance.  Respiratory: Positive for cough and choking. Negative for wheezing.   Cardiovascular: Negative for chest pain and leg swelling.  Gastrointestinal: Negative for abdominal pain and blood in stool.  Genitourinary: Negative for hematuria.       History cervical cancer  Musculoskeletal: Negative.   Neurological: Negative for dizziness, syncope, speech difficulty and weakness.  Hematological: Negative for adenopathy. Does not bruise/bleed easily.  All other systems reviewed and are negative.   BP 139/85 mmHg  Pulse 109  Resp 16  Ht '5\' 8"'$  (1.727 m)  Wt 225 lb (102.059 kg)  BMI 34.22 kg/m2  SpO2 98% Physical Exam  Constitutional: She is oriented to person, place, and time. She appears well-developed. No distress.  Obese  HENT:  Head: Normocephalic and atraumatic.  Mouth/Throat:  No oropharyngeal exudate.  Poor dentition  Eyes: Conjunctivae and EOM are normal. Pupils are equal, round, and reactive to light. No scleral icterus.  Neck: Neck supple. No tracheal deviation present. No thyromegaly present.  Cardiovascular: Normal rate, regular rhythm, normal heart sounds and intact distal pulses.  Exam reveals no gallop and no friction rub.   No murmur heard. Pulmonary/Chest: Effort normal and breath sounds normal. No respiratory distress. She has no wheezes. She has no rales.  Abdominal: Soft. There is no tenderness.  Musculoskeletal: She exhibits no edema.  Lymphadenopathy:    She has no cervical adenopathy.  Neurological: She is alert and oriented to person, place, and time. No cranial nerve deficit.  Motor 5 out of 5 bilaterally  Skin: Skin is warm and dry.  Vitals reviewed.    Diagnostic Tests: CT ANGIOGRAPHY CHEST WITH CONTRAST  TECHNIQUE: Multidetector CT imaging of the chest was performed using the standard protocol during bolus administration of intravenous contrast. Multiplanar CT image reconstructions and MIPs were obtained to evaluate the vascular anatomy.  CONTRAST: 15m OMNIPAQUE IOHEXOL 350 MG/ML SOLN  COMPARISON: Chest radiographs dated 07/06/2015  FINDINGS: No evidence of pulmonary embolism. Branches of the right middle lobe pulmonary artery are diminutive/attenuated but patent.  Mediastinum/Nodes: Heart is normal in size. No pericardial effusion.  Thoracic lymphadenopathy, including:  --11 mm short axis node posterior to the trachea at the thoracic inlet (series 6/ image 14)  --14 mm short axis high right paratracheal node (series 6/ image 23)  --17 mm short axis low right paratracheal node (series 6/ image 29)  --23 mm short axis subcarinal node (series 6/ image 42)  --18 mm short axis interlobar right lower lobe node (series 6/ image 48), less likely a pulmonary nodule  Visualized thyroid is  unremarkable.  Lungs/Pleura: 6.1 x 4.1 cm mass in the posterior right upper lobe (Series 6/ image 39), with cut off of the right upper lobe bronchus (series 6/ image 35), and cut off/effacement of the right middle lobe bronchus (series 6/ image 45). Mass extends to the right hilar region (series 6/image 45).  Secondary postobstructive opacity in the posterior right upper lobe with secondary right middle lobe collapse.  Additional patchy opacities in the peripheral right upper lobe (series 8/image 20) may also reflect postobstructive opacity versus lymphangitic spread.  Nodular central right lower lobe opacities (series 8/ image 45) are favored to interlobar right lower lobe lymph nodes, less likely pulmonary nodules.  Left lung is clear.  No pleural effusion or pneumothorax.  Upper abdomen: Visualized upper abdomen is unremarkable.  Musculoskeletal: Mild degenerative changes of the thoracic spine.  Review of the MIP images  confirms the above findings.  IMPRESSION: No evidence of pulmonary embolism.  6.1 x 4.1 cm posterior right upper lobe mass extending to the right middle lobe and right hilar region. Associated postobstructive opacity in the right upper and middle lobes.  Thoracic lymphadenopathy, measuring up to 2.3 cm short axis, as above.   Electronically Signed  By: Julian Hy M.D.  On: 07/12/2015 23:55      I personally reviewed the CT chest and concur with the findings as noted above. She has a large right upper lobe mass with probable extension into the mediastinum and extensive lymphadenopathy.  Impression: 54 year old woman with a past history of light tobacco abuse but quit about 30 years ago who presented with a cough and congestion. Workup shows a 6.1 x 4.1 cm right upper lobe mass with likely direct extension to the mediastinum and hilar and mediastinal adenopathy. This is highly suspicious for stage III lung cancer. She needs a  biopsy for definitive diagnosis.  I recommended that we proceed with bronchoscopy and endobronchial ultrasound under general anesthesia. She understands this is a diagnostic, and not a therapeutic, procedure. I surrounded the general nature of the procedure. She understands this is an endoscopic procedure done on an outpatient basis. We will do it under general anesthesia to allow for adequate sampling to ensure the best chance of making a diagnosis. She does understand there is no guarantee we will be able to make a diagnosis. I discussed the indications, risks, benefits, and alternatives with her. She understands the risk include, but are not limited to death, MI, stroke, bleeding, pneumothorax, respiratory failure, aspiration, as well as the possibility of other unforeseeable complications.  She accepts the risks and wishes to proceed.  Plan: Bronchoscopy and endobronchial ultrasound on Friday, November 11. We will do this as an outpatient.  I spent 45 minutes with Theresa [name] during this visit, > 50% was spent in counseling [name], MD [facility] [phone]

## 2015-08-02 NOTE — Anesthesia Procedure Notes (Signed)
Procedure Name: Intubation Date/Time: 08/02/2015 7:41 AM Performed by: Shirlyn Goltz Pre-anesthesia Checklist: Patient identified, Emergency Drugs available, Suction available and Patient being monitored Patient Re-evaluated:Patient Re-evaluated prior to inductionOxygen Delivery Method: Circle system utilized Preoxygenation: Pre-oxygenation with 100% oxygen Intubation Type: IV induction Ventilation: Mask ventilation without difficulty and Oral airway inserted - appropriate to patient size Laryngoscope Size: Mac and 3 Grade View: Grade I Tube type: Oral Tube size: 8.5 mm Number of attempts: 1 Airway Equipment and Method: Stylet Placement Confirmation: ETT inserted through vocal cords under direct vision,  positive ETCO2 and breath sounds checked- equal and bilateral Secured at: 22 cm Tube secured with: Tape Dental Injury: Teeth and Oropharynx as per pre-operative assessment

## 2015-08-02 NOTE — Transfer of Care (Signed)
Immediate Anesthesia Transfer of Care Note  Patient: Theresa [name]  Procedure(s) Performed: Procedure(s): VIDEO BRONCHOSCOPY WITH ENDOBRONCHIAL ULTRASOUND (N/A)  Patient Location: PACU  Anesthesia Type:General  Level of Consciousness: awake, alert , oriented and patient cooperative  Airway & Oxygen Therapy: Patient Spontanous Breathing and Patient connected to nasal cannula oxygen  Post-op Assessment: Report given to RN, Post -op Vital signs reviewed and stable, Patient moving all extremities, Patient moving all extremities X 4 and Patient able to stick tongue midline  Post vital signs: Reviewed and stable  Last Vitals:  Filed Vitals:   08/02/15 0928  BP: 128/82  Pulse: 98  Temp: 36.9 C  Resp: 25    Complications: No apparent anesthesia complications

## 2015-08-02 NOTE — Op Note (Signed)
NAME:  Theresa [name], LAMPSON             ACCOUNT NO.:  [id number]  MEDICAL RECORD NO.:  [id number]  LOCATION:  [facility]                         FACILITY:  [facility]  PHYSICIAN:  Revonda Standard. Roxan Hockey, M.D.DATE OF BIRTH:  09/26/1960  DATE OF PROCEDURE:  08/02/2015 DATE OF DISCHARGE:  08/02/2015                              OPERATIVE REPORT   PREOPERATIVE DIAGNOSIS:  Right hilar mass with mediastinal adenopathy.  POSTOPERATIVE DIAGNOSIS:  Squamous cell carcinoma- stage IIIA.  PROCEDURE:   Bronchoscopy with brushings and biopsies Endobronchial ultrasound with mediastinal lymph node aspirations.  SURGEON:  Revonda Standard. Roxan Hockey, M.D.  ANESTHESIA:  General.  FINDINGS:  Markedly enlarged level 4R and 7 lymph nodes, both positive for squamous cell carcinoma. Complete obstruction of right upper lobe bronchus with tumor seen at the origin of the anterior segmental bronchus.  Brushings positive for squamous cell carcinoma.  Multiple biopsies taken.  CLINICAL NOTE:  [name] is a 54 year old woman with a history of tobacco abuse.  She recently was found to have a right hilar mass with mediastinal adenopathy.  She needs biopsy for diagnostic and staging purposes.  The indications, risks, benefits, and alternatives were discussed in detail with the patient.  She understood and accepted the risks and agreed to proceed.  OPERATIVE NOTE:  [name] was brought to the operating room on August 02, 2015.  She had induction of general anesthesia.  After performing a time-out, flexible fiberoptic bronchoscopy was performed. The trachea and left bronchial tree were normal.  The right mainstem bronchus was normal.  The right upper lobe bronchus showed tumor at the origin of the anterior segmental bronchus.  The other 2 segmental bronchus was appeared to be extrinsically compressed.  Further down there was also extrinsic compression of the middle lobe bronchus.  No tumor was seen in the lower lobe  segmental bronchi.  The bronchoscope was removed.  The endobronchial ultrasound probe then was advanced.  The large level 7 node was identified.  Multiple aspirations were performed with a portion of specimens being placed on slides for immediate examination and the remainder being placed into cytologic preparation fluid.  This process was repeated 3 times.  The scope then was pulled back and the right paratracheal area was inspected.  A 4R node was identified and 3 aspirations were performed of this node as well.  The endobronchial ultrasound probe was removed.  The bronchoscope was reinserted.  Brushings were taken from the anterior segmental bronchus of the right upper lobe.  There was bleeding with these brushings.  Bleeding was controlled with saline and dilute epinephrine. The specimens were sent to Pathology.  While awaiting those results, multiple biopsies were taken from the right upper lobe.  Again, there was bleeding, which was controlled with epinephrine.  The initial specimens returned showing squamous cell carcinoma in both lymph nodes as well as the right upper lobe bronchus.  Final inspection was made for hemostasis.  There was no ongoing bleeding.  The scope was withdrawn.  The patient was extubated in the operating room and taken to the postanesthetic care unit in good condition.     Revonda Standard Roxan Hockey, M.D.     SCH/MEDQ  D:  08/02/2015  T:  08/02/2015  Job:  [id number]

## 2015-08-02 NOTE — Anesthesia Postprocedure Evaluation (Signed)
  Anesthesia Post-op Note  Patient: Theresa [name]  Procedure(s) Performed: Procedure(s): VIDEO BRONCHOSCOPY WITH ENDOBRONCHIAL ULTRASOUND (N/A)  Patient Location: PACU  Anesthesia Type:General  Level of Consciousness: awake, alert  and oriented  Airway and Oxygen Therapy: Patient Spontanous Breathing and Patient connected to nasal cannula oxygen  Post-op Pain: mild  Post-op Assessment: Post-op Vital signs reviewed, Patient's Cardiovascular Status Stable, Respiratory Function Stable, Patent Airway and Pain level controlled              Post-op Vital Signs: stable  Last Vitals:  Filed Vitals:   08/02/15 1015  BP: 127/96  Pulse: 88  Temp: 36.4 C  Resp: 18    Complications: No apparent anesthesia complications

## 2015-08-02 NOTE — Interval H&P Note (Signed)
History and Physical Interval Note:  08/02/2015 7:17 AM  Theresa [name]  has presented today for surgery, with the diagnosis of RIGHT HILAR MASS MEDIASTINAL ADENOPATHY  The various methods of treatment have been discussed with the patient and family. After consideration of risks, benefits and other options for treatment, the patient has consented to  Procedure(s): [place] (N/A) as a surgical intervention .  The patient's history has been reviewed, patient examined, no change in status, stable for surgery.  I have reviewed the patient's chart and labs.  Questions were answered to the patient's satisfaction.     [name]

## 2015-08-02 NOTE — Brief Op Note (Signed)
08/02/2015  9:19 AM  PATIENT:  Theresa [name]  54 y.o. female  PRE-OPERATIVE DIAGNOSIS:  RIGHT HILAR MASS/ MEDIASTINAL ADENOPATHY  POST-OPERATIVE DIAGNOSIS:  Squamous cell carcinoma, Clinical stage IIIA   PROCEDURE:  Procedure(s): VIDEO BRONCHOSCOPY WITH ENDOBRONCHIAL ULTRASOUND (N/A) Bronchoscopy with brushings and biopsies EBUS with needle aspirations of mediastinal lymph nodes  SURGEON:  Surgeon(s) and Role:    * [name], MD - Primary  ANESTHESIA:   general  EBL:     BLOOD ADMINISTERED:none  DRAINS: none   LOCAL MEDICATIONS USED:  NONE  SPECIMEN:  Source of Specimen:  Level 7 and 4R nodes, RUL bronchus  DISPOSITION OF SPECIMEN:  PATHOLOGY   PLAN OF CARE: Discharge to home after PACU  PATIENT DISPOSITION:  PACU - hemodynamically stable.   Delay start of Pharmacological VTE agent (>24hrs) due to surgical blood loss or risk of bleeding: not applicable

## 2015-08-02 NOTE — Discharge Instructions (Signed)
Do not drive or engage in heavy physical activity for 24 hours  You may resume normal activities tomorrow  You may have a sore throat and/or a cough. This is normal. You may use over the counter pain relievers and cough medication as needed.  You will probably cough up small amounts of blood over the next few days  Call [phone] if you develop chest pain, fever > 101F, shortness of breath or cough up more than 3 tablespoons of blood.  Follow up with Dr. Julien Nordmann as scheduled

## 2015-08-02 NOTE — Anesthesia Preprocedure Evaluation (Signed)
Anesthesia Evaluation  Patient identified by MRN, date of birth, ID band Patient awake    Reviewed: Allergy & Precautions, NPO status , Patient's Chart, lab work & pertinent test results  Airway Mallampati: II  TM Distance: >3 FB Neck ROM: Full    Dental  (+) Dental Advisory Given, Partial Lower, Partial Upper   Pulmonary former smoker,    breath sounds clear to auscultation       Cardiovascular  Rhythm:Regular Rate:Normal     Neuro/Psych    GI/Hepatic   Endo/Other    Renal/GU      Musculoskeletal   Abdominal   Peds  Hematology   Anesthesia Other Findings   Reproductive/Obstetrics                             Anesthesia Physical Anesthesia Plan  ASA: II  Anesthesia Plan: General   Post-op Pain Management:    Induction: Intravenous  Airway Management Planned: Oral ETT  Additional Equipment:   Intra-op Plan:   Post-operative Plan: Extubation in OR  Informed Consent: I have reviewed the patients History and Physical, chart, labs and discussed the procedure including the risks, benefits and alternatives for the proposed anesthesia with the patient or authorized representative who has indicated his/her understanding and acceptance.   Dental advisory given  Plan Discussed with: CRNA and Anesthesiologist  Anesthesia Plan Comments: (Lung Mass H/O Cervical Cancer  Plan GA with oral ETT  Roberts Gaudy)        Anesthesia Quick Evaluation

## 2015-08-05 ENCOUNTER — Encounter (HOSPITAL_COMMUNITY): Payer: Self-pay | Admitting: Thoracic Surgery (Cardiothoracic Vascular Surgery)

## 2015-08-07 ENCOUNTER — Telehealth: Payer: Self-pay | Admitting: *Deleted

## 2015-08-07 ENCOUNTER — Ambulatory Visit (HOSPITAL_COMMUNITY)
Admission: RE | Admit: 2015-08-07 | Discharge: 2015-08-07 | Disposition: A | Discharge status: Home / Self Care | Payer: Medicare Other | Source: Ambulatory Visit | Attending: Internal Medicine | Admitting: Internal Medicine

## 2015-08-07 DIAGNOSIS — R918 Other nonspecific abnormal finding of lung field: Secondary | ICD-10-CM | POA: Diagnosis not present

## 2015-08-07 DIAGNOSIS — R9082 White matter disease, unspecified: Secondary | ICD-10-CM | POA: Diagnosis not present

## 2015-08-07 DIAGNOSIS — C801 Malignant (primary) neoplasm, unspecified: Secondary | ICD-10-CM | POA: Diagnosis not present

## 2015-08-07 DIAGNOSIS — C4492 Squamous cell carcinoma of skin, unspecified: Secondary | ICD-10-CM | POA: Diagnosis not present

## 2015-08-07 DIAGNOSIS — R590 Localized enlarged lymph nodes: Secondary | ICD-10-CM | POA: Insufficient documentation

## 2015-08-07 DIAGNOSIS — G319 Degenerative disease of nervous system, unspecified: Secondary | ICD-10-CM | POA: Diagnosis not present

## 2015-08-07 LAB — GLUCOSE, CAPILLARY: GLUCOSE-CAPILLARY: 109 mg/dL — AB (ref 65–99)

## 2015-08-07 MED ORDER — GADOBENATE DIMEGLUMINE 529 MG/ML IV SOLN
20.0000 mL | Freq: Once | INTRAVENOUS | Status: AC | PRN
  Administered 2015-08-07: 20 mL via INTRAVENOUS

## 2015-08-07 MED ORDER — FLUDEOXYGLUCOSE F - 18 (FDG) INJECTION
12.5000 | Freq: Once | INTRAVENOUS | Status: DC | PRN
  Administered 2015-08-07: 12.5 via INTRAVENOUS
  Filled 2015-08-07: qty 12.5

## 2015-08-07 NOTE — Telephone Encounter (Signed)
Called and confirmed 08/08/15 clinic appt w/ pt.

## 2015-08-08 ENCOUNTER — Ambulatory Visit (HOSPITAL_BASED_OUTPATIENT_CLINIC_OR_DEPARTMENT_OTHER): Payer: Medicare Other | Admitting: Internal Medicine

## 2015-08-08 ENCOUNTER — Ambulatory Visit
Admission: RE | Admit: 2015-08-08 | Discharge: 2015-08-08 | Disposition: A | Discharge status: Home / Self Care | Payer: Medicare Other | Source: Ambulatory Visit | Attending: Radiation Oncology | Admitting: Radiation Oncology

## 2015-08-08 ENCOUNTER — Telehealth: Payer: Self-pay | Admitting: Internal Medicine

## 2015-08-08 ENCOUNTER — Ambulatory Visit: Payer: Medicare Other | Attending: Internal Medicine | Admitting: Physical Therapy

## 2015-08-08 ENCOUNTER — Other Ambulatory Visit (HOSPITAL_BASED_OUTPATIENT_CLINIC_OR_DEPARTMENT_OTHER): Payer: Medicare Other

## 2015-08-08 ENCOUNTER — Encounter: Payer: Self-pay | Admitting: Internal Medicine

## 2015-08-08 VITALS — BP 158/77 | HR 118 | Temp 98.1°F | Resp 20 | Ht 68.0 in | Wt 218.3 lb

## 2015-08-08 DIAGNOSIS — R5381 Other malaise: Secondary | ICD-10-CM | POA: Insufficient documentation

## 2015-08-08 DIAGNOSIS — R918 Other nonspecific abnormal finding of lung field: Secondary | ICD-10-CM

## 2015-08-08 DIAGNOSIS — C3490 Malignant neoplasm of unspecified part of unspecified bronchus or lung: Secondary | ICD-10-CM

## 2015-08-08 DIAGNOSIS — C342 Malignant neoplasm of middle lobe, bronchus or lung: Secondary | ICD-10-CM | POA: Insufficient documentation

## 2015-08-08 DIAGNOSIS — C3491 Malignant neoplasm of unspecified part of right bronchus or lung: Secondary | ICD-10-CM | POA: Diagnosis not present

## 2015-08-08 DIAGNOSIS — R293 Abnormal posture: Secondary | ICD-10-CM | POA: Diagnosis not present

## 2015-08-08 HISTORY — DX: Malignant neoplasm of middle lobe, bronchus or lung: C34.2

## 2015-08-08 LAB — COMPREHENSIVE METABOLIC PANEL (CC13)
ALBUMIN: 3.5 g/dL (ref 3.5–5.0)
ALK PHOS: 95 U/L (ref 40–150)
ALT: 12 U/L (ref 0–55)
ANION GAP: 11 meq/L (ref 3–11)
AST: 24 U/L (ref 5–34)
BILIRUBIN TOTAL: 0.46 mg/dL (ref 0.20–1.20)
BUN: 8.4 mg/dL (ref 7.0–26.0)
CALCIUM: 10.3 mg/dL (ref 8.4–10.4)
CO2: 25 mEq/L (ref 22–29)
Chloride: 103 mEq/L (ref 98–109)
Creatinine: 0.9 mg/dL (ref 0.6–1.1)
EGFR: 78 mL/min/{1.73_m2} — AB (ref >90)
Glucose: 136 mg/dl (ref 70–140)
POTASSIUM: 3.8 meq/L (ref 3.5–5.1)
SODIUM: 139 meq/L (ref 136–145)
TOTAL PROTEIN: 8.5 g/dL — AB (ref 6.4–8.3)

## 2015-08-08 LAB — CBC WITH DIFFERENTIAL/PLATELET
BASO%: 0.5 % (ref 0.0–2.0)
BASOS ABS: 0 10*3/uL (ref 0.0–0.1)
EOS ABS: 0 10*3/uL (ref 0.0–0.5)
EOS%: 0.7 % (ref 0.0–7.0)
HEMATOCRIT: 34.2 % — AB (ref 34.8–46.6)
HEMOGLOBIN: 11.4 g/dL — AB (ref 11.6–15.9)
LYMPH%: 29.3 % (ref 14.0–49.7)
MCH: 29.1 pg (ref 25.1–34.0)
MCHC: 33.3 g/dL (ref 31.5–36.0)
MCV: 87.2 fL (ref 79.5–101.0)
MONO#: 0.4 10*3/uL (ref 0.1–0.9)
MONO%: 7.1 % (ref 0.0–14.0)
NEUT%: 62.4 % (ref 38.4–76.8)
NEUTROS ABS: 3.8 10*3/uL (ref 1.5–6.5)
PLATELETS: 390 10*3/uL (ref 145–400)
RBC: 3.92 10*6/uL (ref 3.70–5.45)
RDW: 15.7 % — AB (ref 11.2–14.5)
WBC: 6.1 10*3/uL (ref 3.9–10.3)
lymph#: 1.8 10*3/uL (ref 0.9–3.3)

## 2015-08-08 MED ORDER — PROCHLORPERAZINE MALEATE 10 MG PO TABS: 10.0000 mg | ORAL_TABLET | Freq: Four times a day (QID) | ORAL | Status: DC | PRN

## 2015-08-08 MED ORDER — OXYCODONE-ACETAMINOPHEN 5-325 MG PO TABS: 1.0000 | ORAL_TABLET | Freq: Four times a day (QID) | ORAL | Status: DC | PRN

## 2015-08-08 NOTE — Therapy (Signed)
[place], [place], [address] Phone: [phone]   Fax:  [phone]  Physical Therapy Evaluation  Patient Details  Name: Theresa [name] MRN: [id number] Date of Birth: 11/04/1960 Referring Provider: Dr. Curt Bears  Encounter Date: 08/08/2015      PT End of Session - 08/08/15 1506    Visit Number 1   Number of Visits 1   PT Start Time 6195   PT Stop Time 1450   PT Time Calculation (min) 25 min   Activity Tolerance Patient tolerated treatment well   Behavior During Therapy [facility] for tasks assessed/performed      Past Medical History  Diagnosis Date  . Pneumonia   . Cancer ([place])     5 years ago - cervical cancer    Past Surgical History  Procedure Laterality Date  . Wisdom tooth extraction    . Tubal ligation    . Video bronchoscopy with endobronchial ultrasound N/A 08/02/2015    Procedure: VIDEO BRONCHOSCOPY WITH ENDOBRONCHIAL ULTRASOUND;  Surgeon: [name], MD;  Location: [facility];  Service: Thoracic;  Laterality: N/A;    There were no vitals filed for this visit.  Visit Diagnosis:  Physical deconditioning - Plan: PT plan of care cert/re-cert  Poor posture - Plan: PT plan of care cert/re-cert      Subjective Assessment - 08/08/15 1452    Subjective no current complaints   Patient is accompained by: Family member  sister   Pertinent History Pt. presented 05/2015 with cold-likesymptoms to ED; work-up showed right lung mass.  Has had CTA, CT, bronch, PET, and brain MR.  Diagnosis is right lung mass, squamous cell carcinoma, with adenopathy indicated on PET scan.  She is expected to have chemoradiation.  h/o cervical cancer, stage Ib2 and s/p chemoradiation 11/2012.  Ex-smoker quit 32 years ago after 3 pack-years.   Patient Stated Goals get information from all lung clinic providers   Currently in Pain? No/denies            [facility] PT Assessment - 08/08/15 0001     Assessment   Medical Diagnosis right lung mass identified as squamous cell carcinoma with adenopathy on PET   Referring Provider Dr. Curt Bears   Precautions   Precautions Other (comment)  cancer precautions   Restrictions   Weight Bearing Restrictions No   Balance Screen   Has the patient fallen in the past 6 months No   Has the patient had a decrease in activity level because of a fear of falling?  No   Is the patient reluctant to leave their home because of a fear of falling?  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   Leisure no current exercise, but has a workout room at home that includes a treadmill, glider, NuStep-type machine, a stepper, and some weights   Cognition   Overall Cognitive Status Within Functional Limits for tasks assessed   Observation/Other Assessments   Observations Pleasant woman who is very attentive and interested; she appears deconditioned with a weak abdominal wall and increased lumbar lordosis and somewhat overweight   Functional Tests   Functional tests Sit to Stand  Sit to Stand   Comments 15 times in 30 seconds (good)   Posture/Postural Control   Posture/Postural Control Postural limitations   Postural Limitations Increased lumbar lordosis   ROM / Strength   AROM / PROM / Strength AROM   AROM   Overall AROM Comments In standing for trunk AROM, all WFL with patient reaching fingertips 8 inches to floor in forward bend   Ambulation/Gait   Ambulation/Gait Yes   Ambulation/Gait Assistance 7: Independent   Balance   Balance Assessed Yes   Dynamic Standing Balance   Dynamic Standing - Comments reaches forward 17 inches in standing (good)            LYMPHEDEMA/ONCOLOGY QUESTIONNAIRE - 2015/08/16 1505    Type   Cancer Type right lung squamous cell                        PT Education - 08/16/2015 1505    Education provided Yes   Education Details posture, breathing, walking, Cure article on staying active, energy conservation, PT info   Person(s) Educated Patient;Other (comment)  sister   Methods Explanation;Handout   Comprehension Verbalized understanding               Lung Clinic Goals - 08-16-2015 1512    Patient will be able to verbalize understanding of the benefit of exercise to decrease fatigue.   Status Achieved   Patient will be able to verbalize the importance of posture.   Status Achieved   Patient will be able to demonstrate diaphragmatic breathing for improved lung function.   Status Achieved   Patient will be able to verbalize understanding of the role of physical therapy to prevent functional decline and who to contact if physical therapy is needed.   Status Achieved             Plan - August 16, 2015 1507    Clinical Impression Statement Patient with no current complaints but with h/o cervical cancer and with newly diagnosed lung cancer and expected to have chemoradiation.  She does no exercise currently, though she has exercise equipment in her home.     Pt will benefit from skilled therapeutic intervention in order to improve on the following deficits Decreased activity tolerance   Rehab Potential Excellent   PT Frequency One time visit   PT Treatment/Interventions Patient/family education   PT Next Visit Plan None at this time; patient may benefit from therapy as she goes through treatment to help her establish an exercise program which she sounds motivated to do.   PT Home Exercise Plan see education section   Consulted and Agree with Plan of Care Patient          G-Codes - 08/16/15 1513    Functional Limitation Changing and maintaining body position   Changing and Maintaining Body  Position Current Status (Q2595) At least 1 percent but less than 20 percent impaired, limited or restricted   Changing and Maintaining Body Position Goal Status (G3875) At least 1 percent but less than 20 percent impaired, limited or restricted   Changing and Maintaining Body Position Discharge Status (I4332) At least 1 percent but less than 20 percent impaired, limited or restricted       Problem List Patient Active Problem List   Diagnosis Date Noted  . Lung mass 07/16/2015    [place],DONNA 2015-08-16, 3:18 PM  [place], [place], [address] Phone: [phone]   Fax:  [phone]  Name: Theresa [name] MRN: [id number] Date of Birth: 08/08/1961   Serafina Royals, PT 08/08/2015 3:18 PM

## 2015-08-08 NOTE — Progress Notes (Signed)
Radiation Oncology         [phone] ________________________________  Multidisciplinary Thoracic Oncology Clinic [facility]) Initial Outpatient Consultation  Name: Theresa [name] MRN: [id number]  Date: 08/08/2015  DOB: Mar 27, 1961  CC:No PCP Per Patient  Curt Bears, MD   REFERRING PHYSICIAN: Curt Bears, MD  DIAGNOSIS: 54 y.o. 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 Non-small cell carcinoma of lung, stage 3, unspecified laterality ([place]) 162.9 C34.90     HISTORY OF PRESENT ILLNESS::Theresa [name] is a 54 y.o. woman with a persistent cough and was present for about a month. She also felt congested. When the cough would not resolve she finally went to her doctor and was treated with empiric antibiotics for possible pneumonia. She felt a little better that after that, but her cough persisted. A CT chest was done which showed a 6.1 x 4.1 cm right hilar mass and hilar and mediastinal adenopathy. She had bronchoscopy with Dr. Roxan Hockey on 08/02/15 and biopsy revealed squamous cell carcinoma. Subsequent brain MRI on 08/07/15 showed no brain metastases. PET/CT on 11/16 confirmed a 6 cm right hilar mass with collapse of the RML with an SUV of 22.9. She also had hypermetabolic peritracheal and subcarinal adenopathy.  The patient was referred today for presentation in the multidisciplinary conference.  Radiology studies and pathology slides were presented there for review and discussion of treatment options.  A consensus was discussed regarding potential next steps.  PREVIOUS RADIATION THERAPY: Yes; for Cervix cancer, 5 years ago at [facility]:  has a past medical history of Pneumonia and Cancer ([place]).    PAST SURGICAL HISTORY: Past Surgical History  Procedure Laterality Date  . Wisdom tooth extraction    . Tubal ligation    . Video bronchoscopy with endobronchial ultrasound N/A 08/02/2015    Procedure: VIDEO  BRONCHOSCOPY WITH ENDOBRONCHIAL ULTRASOUND;  Surgeon: [name], MD;  Location: [facility] OR;  Service: Thoracic;  Laterality: N/A;    FAMILY HISTORY: family history includes COPD in her mother; Diabetes type II in her mother; Heart disease in her mother; High Cholesterol in her mother; Lung cancer in her father.  SOCIAL HISTORY:  reports that she quit smoking about 25 years ago. Her smoking use included Cigarettes. She has a 1.5 pack-year smoking history. She has never used smokeless tobacco. She reports that she does not drink alcohol or use illicit drugs.  ALLERGIES: Review of patient's allergies indicates no known allergies.  MEDICATIONS:  Current Outpatient Prescriptions  Medication Sig Dispense Refill  . Calcium Carb-Cholecalciferol (CALCIUM 600 + D) 600-200 MG-UNIT TABS Take 2 capsules by mouth daily.    Marland Kitchen docusate sodium (COLACE) 100 MG capsule Take by mouth.    Marland Kitchen ibuprofen (ADVIL,MOTRIN) 800 MG tablet Take by mouth.    . Multiple Vitamin (MULTIVITAMIN WITH MINERALS) TABS tablet Take 1 tablet by mouth daily.    . naproxen sodium (ANAPROX) 220 MG tablet Take 440 mg by mouth 2 (two) times daily as needed (pain).    Marland Kitchen oxyCODONE-acetaminophen (PERCOCET/ROXICET) 5-325 MG tablet Take 1 tablet by mouth every 6 (six) hours as needed for severe pain. 40 tablet 0  . prochlorperazine (COMPAZINE) 10 MG tablet Take 1 tablet (10 mg total) by mouth every 6 (six) hours as needed for nausea or vomiting. 30 tablet 0   No current facility-administered medications for this encounter.   Facility-Administered Medications Ordered in Other Encounters  Medication Dose Route Frequency Provider  Last Rate Last Dose  . fludeoxyglucose F - 18 (FDG) injection 12.5 milli Curie  12.5 milli Curie Intravenous Once PRN Curt Bears, MD   12.5 milli Curie at 08/07/15 0845    REVIEW OF SYSTEMS:  A 15 point review of systems is documented in the electronic medical record. This was obtained by the nursing staff.  However, I reviewed this with the patient to discuss relevant findings and make appropriate changes.  Pertinent items are noted in HPI.     PHYSICAL EXAM: Vitals with BMI 08/08/2015  Height '5\' 8"'$   Weight 218 lbs 5 oz  BMI 78.2  Systolic 956  Diastolic 77  Pulse 213  Respirations 20   per med-onc General appearance: alert, cooperative and no distress Head: Normocephalic, without obvious abnormality, atraumatic Neck: no adenopathy, no JVD, supple, symmetrical, trachea midline and thyroid not enlarged, symmetric, no tenderness/mass/nodules Lymph nodes: Cervical, supraclavicular, and axillary nodes normal. Resp: clear to auscultation bilaterally Back: symmetric, no curvature. ROM normal. No CVA tenderness. Cardio: regular rate and rhythm, S1, S2 normal, no murmur, click, rub or gallop GI: soft, non-tender; bowel sounds normal; no masses, no organomegaly Extremities: extremities normal, atraumatic, no cyanosis or edema Neurologic: Alert and oriented X 3, normal strength and tone. Normal symmetric reflexes. Normal coordination and gait  KPS = 90  100 - Normal; no complaints; no evidence of disease. 90   - Able to carry on normal activity; minor signs or symptoms of disease. 80   - Normal activity with effort; some signs or symptoms of disease. 68   - Cares for self; unable to carry on normal activity or to do active work. 60   - Requires occasional assistance, but is able to care for most of his personal needs. 50   - Requires considerable assistance and frequent medical care. 72   - Disabled; requires special care and assistance. 23   - Severely disabled; hospital admission is indicated although death not imminent. 40   - Very sick; hospital admission necessary; active supportive treatment necessary. 10   - Moribund; fatal processes progressing rapidly. 0     - Dead  Karnofsky DA, Abelmann [place], Craver LS and Burchenal JH [phone]) The use of the nitrogen mustards in the palliative  treatment of carcinoma: with particular reference to bronchogenic carcinoma Cancer 1 634-56  LABORATORY DATA:  Lab Results  Component Value Date   WBC 6.1 08/08/2015   HGB 11.4* 08/08/2015   HCT 34.2* 08/08/2015   MCV 87.2 08/08/2015   PLT 390 08/08/2015   Lab Results  Component Value Date   NA 139 08/08/2015   K 3.8 08/08/2015   CL 103 08/01/2015   CO2 25 08/08/2015   Lab Results  Component Value Date   ALT 12 08/08/2015   AST 24 08/08/2015   ALKPHOS 95 08/08/2015   BILITOT 0.46 08/08/2015    PULMONARY FUNCTION TEST:  N/a   RADIOGRAPHY: Dg Chest 2 View  08/01/2015  CLINICAL DATA:  The 0 bronchoscopy with endobronchial ultrasound for a right hilar mass and mediastinal lymphadenopathy. EXAM: CHEST  2 VIEW COMPARISON:  PA and lateral chest x-ray of July 15, 2015 FINDINGS: There is persistent right-sided hilar mass. There is no pleural effusion. There is no mediastinal shift. There is no pneumothorax. The left lung is clear. The heart and pulmonary vascularity are normal. Mild soft tissue fullness of the right paratracheal region is stable. There is gentle S-shaped thoracolumbar curvature. IMPRESSION: Persistent large right hilar mass. There may be  right paratracheal lymphadenopathy. If the patient has already undergone the video bronchoscopy, no postprocedure complication is observed. Electronically Signed   By: David  Martinique M.D.   On: 08/01/2015 10:12   Dg Chest 2 View  07/15/2015  CLINICAL DATA:  Nonproductive cough subsequent evaluation EXAM: CHEST  2 VIEW COMPARISON:  07/06/2015 FINDINGS: Known right perihilar and upper lobe mass again identified. The opacity created by this on the chest radiograph is unchanged. Some mild peripheral opacities suggest post obstruction volume loss as seen on CT scan performed 07/12/2015. No pleural effusion. Left lung remains clear. IMPRESSION: Stable known right sided mass and associated opacity. No change from 07/06/2015. Electronically Signed    By: Skipper Cliche M.D.   On: 07/15/2015 15:29   Ct Soft Tissue Neck W Contrast  07/22/2015  CLINICAL DATA:  Choking sensation. Mass sensation in the neck. Persistent cough for 3 weeks. EXAM: CT NECK WITH CONTRAST TECHNIQUE: Multidetector CT imaging of the neck was performed using the standard protocol following the bolus administration of intravenous contrast. CONTRAST:  63m OMNIPAQUE IOHEXOL 300 MG/ML  SOLN COMPARISON:  CT scan of the cervical spine dated 06/04/2008 and chest CT scan dated 07/12/2015 FINDINGS: Pharynx and larynx: There is slight prominence of the tonsils bilaterally without evidence of a mass or abscess. Salivary glands: Normal. Thyroid: The left lobe of the thyroid gland is inhomogeneous. There is a least 1 nodule in the left lobe as well as a tiny nodule in the right lobe. Lymph nodes: Multiple small nodes in both sides of the neck. The patient does have multiple abnormal and large nodes in the superior mediastinum including enlargement of azygos node to a diameter 21 mm. 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 Limited intracranial: Normal. Visualized orbits: Normal. Mastoids and visualized paranasal sinuses: Normal. Skeleton: Normal. Upper chest: Superior mediastinal adenopathy and right lung mass as seen on the prior chest CT. IMPRESSION: 1. Slight prominence of the tonsils without an abscess or mass. 2. No adenopathy in the neck. 3. Superior mediastinal adenopathy. 4. Aberrant right subclavian artery. 5. Nodules in the thyroid gland. Electronically Signed   By: JLorriane ShireM.D.   On: 07/22/2015 14:56   Ct Angio Chest Pe W/cm &/or Wo Cm  07/12/2015  CLINICAL DATA:  Shortness of breath, tightness, cough. Abnormal chest radiograph with possible lung malignancy. EXAM: CT ANGIOGRAPHY CHEST WITH CONTRAST TECHNIQUE: Multidetector  CT imaging of the chest was performed using the standard protocol during bolus administration of intravenous contrast. Multiplanar CT image reconstructions and MIPs were obtained to evaluate the vascular anatomy. CONTRAST:  1026mOMNIPAQUE IOHEXOL 350 MG/ML SOLN COMPARISON:  Chest radiographs dated 07/06/2015 FINDINGS: No evidence of pulmonary embolism. Branches of the right middle lobe pulmonary artery are diminutive/attenuated but patent. Mediastinum/Nodes: Heart is normal in size. No pericardial effusion. Thoracic lymphadenopathy, including: --11 mm short axis node posterior to the trachea at the thoracic inlet (series 6/ image 14) --14 mm short axis high right paratracheal node (series 6/ image 23) --17 mm short axis low right paratracheal node (series 6/ image 29) --23 mm short axis subcarinal node (series 6/ image 42) --18 mm short axis interlobar right lower lobe node (series 6/ image 48), less likely a pulmonary nodule Visualized thyroid is unremarkable. Lungs/Pleura: 6.1 x 4.1 cm mass in the posterior right upper lobe (Series 6/ image 39), with cut off  of the right upper lobe bronchus (series 6/ image 35), and cut off/effacement of the right middle lobe bronchus (series 6/ image 45). Mass extends to the right hilar region (series 6/image 45). Secondary postobstructive opacity in the posterior right upper lobe with secondary right middle lobe collapse. Additional patchy opacities in the peripheral right upper lobe (series 8/image 20) may also reflect postobstructive opacity versus lymphangitic spread. Nodular central right lower lobe opacities (series 8/ image 45) are favored to interlobar right lower lobe lymph nodes, less likely pulmonary nodules. Left lung is clear. No pleural effusion or pneumothorax. Upper abdomen: Visualized upper abdomen is unremarkable. Musculoskeletal: Mild degenerative changes of the thoracic spine. Review of the MIP images confirms the above findings. IMPRESSION: No evidence of  pulmonary embolism. 6.1 x 4.1 cm posterior right upper lobe mass extending to the right middle lobe and right hilar region. Associated postobstructive opacity in the right upper and middle lobes. Thoracic lymphadenopathy, measuring up to 2.3 cm short axis, as above. Electronically Signed   By: Julian Hy M.D.   On: 07/12/2015 23:55   Mr Jeri Cos JM Contrast  08/07/2015  CLINICAL DATA:  New diagnosis of squamous cell cancer. Evaluate for possible brain Mets. Staging. EXAM: MRI HEAD WITHOUT AND WITH CONTRAST TECHNIQUE: Multiplanar, multiecho pulse sequences of the brain and surrounding structures were obtained without and with intravenous contrast. CONTRAST:  66m MULTIHANCE GADOBENATE DIMEGLUMINE 529 MG/ML IV SOLN COMPARISON:  CT head 06/04/2008. FINDINGS: No evidence for acute infarction, hemorrhage, mass lesion, hydrocephalus, or extra-axial fluid. Slight premature for age cerebral and cerebellar atrophy. Minor subcortical white matter disease, nonspecific. Pituitary, pineal, and cerebellar tonsils unremarkable. Flow voids are maintained throughout the carotid, basilar, and vertebral arteries. There are no areas of chronic hemorrhage. Post infusion, no abnormal enhancement of the brain or meninges. Major dural venous sinuses are patent. Visualized calvarium, skull base, and upper cervical osseous structures unremarkable. Scalp and extracranial soft tissues, orbits, sinuses, and mastoids show no acute process. Mild progression of atrophy is suspected compared with prior CT. IMPRESSION: Mild atrophy.  Minor white matter disease, nonspecific. No acute intracranial findings. Electronically Signed   By: JStaci RighterM.D.   On: 08/07/2015 07:53   Nm Pet Image Initial (pi) Skull Base To Thigh  08/07/2015  CLINICAL DATA:  Initial treatment strategy for right squamous cell carcinoma. EXAM: NUCLEAR MEDICINE PET SKULL BASE TO THIGH TECHNIQUE: 12.5 mCi F-18 FDG was injected intravenously. Full-ring PET imaging  was performed from the skull base to thigh after the radiotracer. CT data was obtained and used for attenuation correction and anatomic localization. FASTING BLOOD GLUCOSE:  Value: 109 mg/dl COMPARISON:  Multiple exams, including 07/12/2015 FINDINGS: NECK Relatively symmetric hypermetabolic tissue in the tonsillar pillars, 9.4 on the left and SUV max of 11.1 on the right. CHEST Right hilar mass with associated right middle lobe collapse and probable extension into the right upper lobe and right lower lobe along the hilum, region of hypermetabolic activity measuring 6.0 by 5.0 cm, maximum standard uptake value 22.9. Subcarinal lymph node measuring 2.0 cm in short axis on image 66 series 4 maximum standard uptake value 17.3. Multifocal right paratracheal adenopathy, lower paratracheal node measuring 2 cm in short axis on image 58 series 4 with maximum standard uptake value 15.2. Other hypermetabolic paratracheal lymph nodes are present on the right. No left-sided disease. 1.3 by 0.8 cm right upper lobe nodule on image 52 series 4, maximum standard uptake value 2.9. ABDOMEN/PELVIS High activity in the cecum this  likely physiologic. Otherwise negative. Several hypodense lesions in the dome of the liver are not hypermetabolic and likely benign. SKELETON Mildly heterogeneous activity in the thoracic vertebra but no compelling findings of metastatic disease. IMPRESSION: 1. 6 cm right hilar mass with collapse of the right middle lobe and probable extension of mass into the right upper lobe and right lower lobe along the hilum, maximum standard uptake value 22.9, compatible with malignancy. 2. Pathologic and hypermetabolic right paratracheal and subcarinal adenopathy. 3. Indeterminate 1.3 by 0.8 cm right upper lobe nodule, maximum standard uptake value is 2.9 and I feel that this could be inflammatory rather than necessarily being malignant. There is some other ground-glass and oblong nodules in the right upper lobe which  are probably inflammatory. 4. Hypermetabolic activity in both tonsils is fairly symmetric and probably physiologic, but may merit attention on followup studies. Electronically Signed   By: Van Clines M.D.   On: 08/07/2015 10:49      IMPRESSION: Pleasant 54 y.o. woman with Stage IIIA lung cancer squamous cell carcinoma. She is a good candidate for radiotherapy with concurrent chemotherapy.  PLAN: Today, I talked to the patient and family about the findings and work-up thus far.  We discussed the natural history of Stage IIIa lung cancer and general treatment, highlighting the role of radiotherapy in the management.  We discussed the available radiation techniques, and focused on the details of logistics and delivery.  We reviewed the anticipated acute and late sequelae associated with radiation in this setting.  The patient was encouraged to ask questions that I answered to the best of my ability.  The patient would like to proceed with radiation and has been scheduled for CT simulation tomorrow, 11/18, at 3 pm.  I spent 45 minutes minutes face to face with the patient and more than 50% of that time was spent in counseling and/or coordination of care.    ------------------------------------------------  Sheral Apley. Tammi Klippel, M.D.  This document serves as a record of services personally performed by Tyler Pita, MD. It was created on his behalf by Arlyce Harman, a trained medical scribe. The creation of this record is based on the scribe's personal observations and the provider's statements to them. This document has been checked and approved by the attending provider.

## 2015-08-08 NOTE — Progress Notes (Signed)
[place] [phone]   [phone] Multidisciplinary thoracic oncology clinic  OFFICE PROGRESS NOTE  No PCP Per Patient No address on file  DIAGNOSIS: Stage IIIa (T2b, N2, M0) non-small cell lung cancer, invasive poorly differentiated squamous cell carcinoma diagnosed in November 2016 and presented with large right hilar mass with collapse of the right middle lobe and extension into the right upper lobe and right lower lobe along the hilum with right paratracheal and subcarinal lymphadenopathy. PDL 1 testing is still pending.  PRIOR THERAPY: None  CURRENT THERAPY: Concurrent chemoradiation with weekly carboplatin for AUC of 2 and paclitaxel 45 MG/M2. First dose 08/19/2015  INTERVAL HISTORY: Theresa [name] 54 y.o. female returns to the clinic today for follow-up visit accompanied by her sister. The patient is feeling fine today with no specific complaints except for low back pain. She continues to have mild cough and shortness of breath with exertion. She denied having any significant hemoptysis. The patient denied having any significant weight loss or night sweats. She has no nausea or vomiting. She had a recent PET scan as well as MRI of the brain in addition to bronchoscopy with brushings and biopsies as well as endobronchial ultrasound biopsy of the mediastinal lymph nodes under the care of Dr. Roxan Hockey on 08/02/2015. The patient is here today for evaluation and discussion of her treatment options.  MEDICAL HISTORY: Past Medical History  Diagnosis Date  . Pneumonia   . Cancer ([place])     5 years ago - cervical cancer    ALLERGIES:  has No Known Allergies.  MEDICATIONS:  Current Outpatient Prescriptions  Medication Sig Dispense Refill  . Calcium Carb-Cholecalciferol (CALCIUM 600 + D) 600-200 MG-UNIT TABS Take 2 capsules by mouth daily.    Marland Kitchen docusate sodium (COLACE) 100 MG capsule Take by mouth.    Marland Kitchen ibuprofen (ADVIL,MOTRIN) 800 MG  tablet Take by mouth.    . naproxen sodium (ANAPROX) 220 MG tablet Take 440 mg by mouth 2 (two) times daily as needed (pain).    . Multiple Vitamin (MULTIVITAMIN WITH MINERALS) TABS tablet Take 1 tablet by mouth daily.    Marland Kitchen oxyCODONE-acetaminophen (PERCOCET/ROXICET) 5-325 MG tablet Take 1 tablet by mouth every 6 (six) hours as needed for severe pain. 40 tablet 0  . prochlorperazine (COMPAZINE) 10 MG tablet Take 1 tablet (10 mg total) by mouth every 6 (six) hours as needed for nausea or vomiting. 30 tablet 0   No current facility-administered medications for this visit.   Facility-Administered Medications Ordered in Other Visits  Medication Dose Route Frequency Provider Last Rate Last Dose  . fludeoxyglucose F - 18 (FDG) injection 12.5 milli Curie  12.5 milli Curie Intravenous Once PRN Curt Bears, MD   12.5 milli Curie at 08/07/15 0845    SURGICAL HISTORY:  Past Surgical History  Procedure Laterality Date  . Wisdom tooth extraction    . Tubal ligation    . Video bronchoscopy with endobronchial ultrasound N/A 08/02/2015    Procedure: VIDEO BRONCHOSCOPY WITH ENDOBRONCHIAL ULTRASOUND;  Surgeon: [name], MD;  Location: [facility];  Service: Thoracic;  Laterality: N/A;    REVIEW OF SYSTEMS:  Constitutional: negative Eyes: negative Ears, nose, mouth, throat, and face: negative Respiratory: positive for cough and dyspnea on exertion Cardiovascular: negative Gastrointestinal: negative Genitourinary:negative Integument/breast: negative Hematologic/lymphatic: negative Musculoskeletal:positive for back pain Neurological: negative Behavioral/Psych: negative Endocrine: negative Allergic/Immunologic: negative   PHYSICAL EXAMINATION: General appearance: alert, cooperative and no distress Head: Normocephalic, without obvious  abnormality, atraumatic Neck: no adenopathy, no JVD, supple, symmetrical, trachea midline and thyroid not enlarged, symmetric, no  tenderness/mass/nodules Lymph nodes: Cervical, supraclavicular, and axillary nodes normal. Resp: clear to auscultation bilaterally Back: symmetric, no curvature. ROM normal. No CVA tenderness. Cardio: regular rate and rhythm, S1, S2 normal, no murmur, click, rub or gallop GI: soft, non-tender; bowel sounds normal; no masses,  no organomegaly Extremities: extremities normal, atraumatic, no cyanosis or edema Neurologic: Alert and oriented X 3, normal strength and tone. Normal symmetric reflexes. Normal coordination and gait  ECOG PERFORMANCE STATUS: 1 - Symptomatic but completely ambulatory  Blood pressure 158/77, pulse 118, temperature 98.1 F (36.7 C), temperature source Oral, resp. rate 20, height '5\' 8"'$  (1.727 m), weight 218 lb 4.8 oz (99.02 kg), SpO2 100 %.  LABORATORY DATA: Lab Results  Component Value Date   WBC 6.1 08/08/2015   HGB 11.4* 08/08/2015   HCT 34.2* 08/08/2015   MCV 87.2 08/08/2015   PLT 390 08/08/2015      Chemistry      Component Value Date/Time   NA 139 08/08/2015 1330   NA 137 08/01/2015 0942   K 3.8 08/08/2015 1330   K 3.2* 08/01/2015 0942   CL 103 08/01/2015 0942   CO2 25 08/08/2015 1330   CO2 25 08/01/2015 0942   BUN 8.4 08/08/2015 1330   BUN 7 08/01/2015 0942   CREATININE 0.9 08/08/2015 1330   CREATININE 0.89 08/01/2015 0942      Component Value Date/Time   CALCIUM 10.3 08/08/2015 1330   CALCIUM 9.8 08/01/2015 0942   ALKPHOS 95 08/08/2015 1330   ALKPHOS 78 08/01/2015 0942   AST 24 08/08/2015 1330   AST 26 08/01/2015 0942   ALT 12 08/08/2015 1330   ALT 15 08/01/2015 0942   BILITOT 0.46 08/08/2015 1330   BILITOT 0.5 08/01/2015 0942       RADIOGRAPHIC STUDIES: Dg Chest 2 View  08/01/2015  CLINICAL DATA:  The 0 bronchoscopy with endobronchial ultrasound for a right hilar mass and mediastinal lymphadenopathy. EXAM: CHEST  2 VIEW COMPARISON:  PA and lateral chest x-ray of July 15, 2015 FINDINGS: There is persistent right-sided hilar mass.  There is no pleural effusion. There is no mediastinal shift. There is no pneumothorax. The left lung is clear. The heart and pulmonary vascularity are normal. Mild soft tissue fullness of the right paratracheal region is stable. There is gentle S-shaped thoracolumbar curvature. IMPRESSION: Persistent large right hilar mass. There may be right paratracheal lymphadenopathy. If the patient has already undergone the video bronchoscopy, no postprocedure complication is observed. Electronically Signed   By: David  Martinique M.D.   On: 08/01/2015 10:12   Dg Chest 2 View  07/15/2015  CLINICAL DATA:  Nonproductive cough subsequent evaluation EXAM: CHEST  2 VIEW COMPARISON:  07/06/2015 FINDINGS: Known right perihilar and upper lobe mass again identified. The opacity created by this on the chest radiograph is unchanged. Some mild peripheral opacities suggest post obstruction volume loss as seen on CT scan performed 07/12/2015. No pleural effusion. Left lung remains clear. IMPRESSION: Stable known right sided mass and associated opacity. No change from 07/06/2015. Electronically Signed   By: Skipper Cliche M.D.   On: 07/15/2015 15:29   Ct Soft Tissue Neck W Contrast  07/22/2015  CLINICAL DATA:  Choking sensation. Mass sensation in the neck. Persistent cough for 3 weeks. EXAM: CT NECK WITH CONTRAST TECHNIQUE: Multidetector CT imaging of the neck was performed using the standard protocol following the bolus administration of intravenous contrast.  CONTRAST:  57m OMNIPAQUE IOHEXOL 300 MG/ML  SOLN COMPARISON:  CT scan of the cervical spine dated 06/04/2008 and chest CT scan dated 07/12/2015 FINDINGS: Pharynx and larynx: There is slight prominence of the tonsils bilaterally without evidence of a mass or abscess. Salivary glands: Normal. Thyroid: The left lobe of the thyroid gland is inhomogeneous. There is a least 1 nodule in the left lobe as well as a tiny nodule in the right lobe. Lymph nodes: Multiple small nodes in both  sides of the neck. The patient does have multiple abnormal and large nodes in the superior mediastinum including enlargement of azygos node to a diameter 21 mm. 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 Limited intracranial: Normal. Visualized orbits: Normal. Mastoids and visualized paranasal sinuses: Normal. Skeleton: Normal. Upper chest: Superior mediastinal adenopathy and right lung mass as seen on the prior chest CT. IMPRESSION: 1. Slight prominence of the tonsils without an abscess or mass. 2. No adenopathy in the neck. 3. Superior mediastinal adenopathy. 4. Aberrant right subclavian artery. 5. Nodules in the thyroid gland. Electronically Signed   By: JLorriane ShireM.D.   On: 07/22/2015 14:56   Ct Angio Chest Pe W/cm &/or Wo Cm  07/12/2015  CLINICAL DATA:  Shortness of breath, tightness, cough. Abnormal chest radiograph with possible lung malignancy. EXAM: CT ANGIOGRAPHY CHEST WITH CONTRAST TECHNIQUE: Multidetector CT imaging of the chest was performed using the standard protocol during bolus administration of intravenous contrast. Multiplanar CT image reconstructions and MIPs were obtained to evaluate the vascular anatomy. CONTRAST:  1035mOMNIPAQUE IOHEXOL 350 MG/ML SOLN COMPARISON:  Chest radiographs dated 07/06/2015 FINDINGS: No evidence of pulmonary embolism. Branches of the right middle lobe pulmonary artery are diminutive/attenuated but patent. Mediastinum/Nodes: Heart is normal in size. No pericardial effusion. Thoracic lymphadenopathy, including: --11 mm short axis node posterior to the trachea at the thoracic inlet (series 6/ image 14) --14 mm short axis high right paratracheal node (series 6/ image 23) --17 mm short axis low right paratracheal node (series 6/ image 29) --23 mm short axis subcarinal node (series 6/ image 42) --18 mm short axis  interlobar right lower lobe node (series 6/ image 48), less likely a pulmonary nodule Visualized thyroid is unremarkable. Lungs/Pleura: 6.1 x 4.1 cm mass in the posterior right upper lobe (Series 6/ image 39), with cut off of the right upper lobe bronchus (series 6/ image 35), and cut off/effacement of the right middle lobe bronchus (series 6/ image 45). Mass extends to the right hilar region (series 6/image 45). Secondary postobstructive opacity in the posterior right upper lobe with secondary right middle lobe collapse. Additional patchy opacities in the peripheral right upper lobe (series 8/image 20) may also reflect postobstructive opacity versus lymphangitic spread. Nodular central right lower lobe opacities (series 8/ image 45) are favored to interlobar right lower lobe lymph nodes, less likely pulmonary nodules. Left lung is clear. No pleural effusion or pneumothorax. Upper abdomen: Visualized upper abdomen is unremarkable. Musculoskeletal: Mild degenerative changes of the thoracic spine. Review of the MIP images confirms the above findings. IMPRESSION: No evidence of pulmonary embolism. 6.1 x 4.1 cm posterior right upper lobe mass extending to the right middle lobe and right hilar region. Associated postobstructive opacity in the right upper and middle lobes. Thoracic lymphadenopathy, measuring up to 2.3 cm short axis, as above. Electronically Signed   By: SrBertis Ruddy  [place] Pink M.D.   On: 07/12/2015 23:55   Mr Jeri Cos QI Contrast  08/07/2015  CLINICAL DATA:  New diagnosis of squamous cell cancer. Evaluate for possible brain Mets. Staging. EXAM: MRI HEAD WITHOUT AND WITH CONTRAST TECHNIQUE: Multiplanar, multiecho pulse sequences of the brain and surrounding structures were obtained without and with intravenous contrast. CONTRAST:  40m MULTIHANCE GADOBENATE DIMEGLUMINE 529 MG/ML IV SOLN COMPARISON:  CT head 06/04/2008. FINDINGS: No evidence for acute infarction, hemorrhage, mass lesion, hydrocephalus, or  extra-axial fluid. Slight premature for age cerebral and cerebellar atrophy. Minor subcortical white matter disease, nonspecific. Pituitary, pineal, and cerebellar tonsils unremarkable. Flow voids are maintained throughout the carotid, basilar, and vertebral arteries. There are no areas of chronic hemorrhage. Post infusion, no abnormal enhancement of the brain or meninges. Major dural venous sinuses are patent. Visualized calvarium, skull base, and upper cervical osseous structures unremarkable. Scalp and extracranial soft tissues, orbits, sinuses, and mastoids show no acute process. Mild progression of atrophy is suspected compared with prior CT. IMPRESSION: Mild atrophy.  Minor white matter disease, nonspecific. No acute intracranial findings. Electronically Signed   By: JStaci RighterM.D.   On: 08/07/2015 07:53   Nm Pet Image Initial (pi) Skull Base To Thigh  08/07/2015  CLINICAL DATA:  Initial treatment strategy for right squamous cell carcinoma. EXAM: NUCLEAR MEDICINE PET SKULL BASE TO THIGH TECHNIQUE: 12.5 mCi F-18 FDG was injected intravenously. Full-ring PET imaging was performed from the skull base to thigh after the radiotracer. CT data was obtained and used for attenuation correction and anatomic localization. FASTING BLOOD GLUCOSE:  Value: 109 mg/dl COMPARISON:  Multiple exams, including 07/12/2015 FINDINGS: NECK Relatively symmetric hypermetabolic tissue in the tonsillar pillars, 9.4 on the left and SUV max of 11.1 on the right. CHEST Right hilar mass with associated right middle lobe collapse and probable extension into the right upper lobe and right lower lobe along the hilum, region of hypermetabolic activity measuring 6.0 by 5.0 cm, maximum standard uptake value 22.9. Subcarinal lymph node measuring 2.0 cm in short axis on image 66 series 4 maximum standard uptake value 17.3. Multifocal right paratracheal adenopathy, lower paratracheal node measuring 2 cm in short axis on image 58 series 4 with  maximum standard uptake value 15.2. Other hypermetabolic paratracheal lymph nodes are present on the right. No left-sided disease. 1.3 by 0.8 cm right upper lobe nodule on image 52 series 4, maximum standard uptake value 2.9. ABDOMEN/PELVIS High activity in the cecum this likely physiologic. Otherwise negative. Several hypodense lesions in the dome of the liver are not hypermetabolic and likely benign. SKELETON Mildly heterogeneous activity in the thoracic vertebra but no compelling findings of metastatic disease. IMPRESSION: 1. 6 cm right hilar mass with collapse of the right middle lobe and probable extension of mass into the right upper lobe and right lower lobe along the hilum, maximum standard uptake value 22.9, compatible with malignancy. 2. Pathologic and hypermetabolic right paratracheal and subcarinal adenopathy. 3. Indeterminate 1.3 by 0.8 cm right upper lobe nodule, maximum standard uptake value is 2.9 and I feel that this could be inflammatory rather than necessarily being malignant. There is some other ground-glass and oblong nodules in the right upper lobe which are probably inflammatory. 4. Hypermetabolic activity in both tonsils is fairly symmetric and probably physiologic, but may merit attention on followup studies. Electronically Signed   By: WVan ClinesM.D.   On: 08/07/2015 10:49    ASSESSMENT AND PLAN: This is a very pleasant 54years old African-American female  recently diagnosed with a stage IIIa non-small cell lung cancer, squamous cell carcinoma with large right hilar mass in addition to mediastinal lymphadenopathy diagnosed in November 2016. The recent MRI of the brain showed no evidence for metastatic disease to the brain. I had a lengthy discussion with the patient today about her current disease is stage, prognosis and treatment options. I recommended for the patient a course of concurrent chemoradiation with weekly carboplatin for AUC of 2 and paclitaxel 45 MG/M2. I  discussed with the patient adverse effect of the chemotherapy including but not limited to alopecia, myelosuppression, nausea and vomiting, peripheral neuropathy, liver or renal dysfunction. I will arrange for the patient to have a chemotherapy education class before starting the first dose of his chemotherapy. She is expected to start the first cycle of her treatment on 08/19/2015. For the low back pain, I gave the patient prescription for Percocet 5/325 mg by mouth every 6 hours as needed. I will also send prescription to her pharmacy with Compazine 10 mg by mouth every 6 hours as needed for nausea. The patient was seen later today by Dr. Tammi Klippel for evaluation and discussion of the radiotherapy option. She was seen during the multidisciplinary thoracic oncology clinic today by medical oncology, radiation oncology and physical therapist. The patient would come back for follow-up visit in 3 weeks for reevaluation and management of any adverse effect of her treatment. She was advised to call immediately if she has any concerning symptoms in the interval. The patient voices understanding of current disease status and treatment options and is in agreement with the current care plan.  All questions were answered. The patient knows to call the clinic with any problems, questions or concerns. We can certainly see the patient much sooner if necessary.  I spent 20 minutes counseling the patient face to face. The total time spent in the appointment was 30 minutes.  Disclaimer: This note was dictated with voice recognition software. Similar sounding words can inadvertently be transcribed and may not be corrected upon review.

## 2015-08-08 NOTE — Telephone Encounter (Signed)
per pof to sch pt appt-sent MW email to sch pt trmt-pt to get updated copy of updated avs @ chemo class on 11/22

## 2015-08-09 ENCOUNTER — Telehealth: Payer: Self-pay | Admitting: *Deleted

## 2015-08-09 ENCOUNTER — Ambulatory Visit
Admission: RE | Admit: 2015-08-09 | Discharge: 2015-08-09 | Disposition: A | Discharge status: Home / Self Care | Payer: Medicare Other | Source: Ambulatory Visit | Attending: Radiation Oncology | Admitting: Radiation Oncology

## 2015-08-09 DIAGNOSIS — Z510 Encounter for antineoplastic radiation therapy: Secondary | ICD-10-CM | POA: Diagnosis not present

## 2015-08-09 DIAGNOSIS — C3491 Malignant neoplasm of unspecified part of right bronchus or lung: Secondary | ICD-10-CM

## 2015-08-09 NOTE — Progress Notes (Signed)
  Radiation Oncology         [phone] ________________________________  Name: Theresa [name] MRN: [id number]  Date: 08/09/2015  DOB: October 03, 1960  SIMULATION AND TREATMENT PLANNING NOTE    ICD-9-CM ICD-10-CM   1. Non-small cell carcinoma of lung, stage 3, right (HCC) 162.9 C34.91     DIAGNOSIS:  [name] is a pleasant 54 y.o. woman with Stage IIIa lung cancer squamous cell carcinoma  NARRATIVE:  The patient was brought to the [facility].  Identity was confirmed.  All relevant records and images related to the planned course of therapy were reviewed.  The patient freely provided informed written consent to proceed with treatment after reviewing the details related to the planned course of therapy. The consent form was witnessed and verified by the simulation staff.  Then, the patient was set-up in a stable reproducible  supine position for radiation therapy.  CT images were obtained.  Surface markings were placed.  The CT images were loaded into the planning software.  Then the target and avoidance structures were contoured.  Treatment planning then occurred.  The radiation prescription was entered and confirmed.  Then, I designed and supervised the construction of a total of 6 medically necessary complex treatment devices, including a BodyFix immobilization mold custom fitted to the patient along with 5 multileaf collimators conformally shaped radiation around the treatment target while shielding critical structures such as the heart and spinal cord maximally.  I have requested : 3D Simulation  I have requested a DVH of the following structures: Left lung, right lung, spinal cord, heart, esophagus, and target.  I have ordered:Nutrition Consult  SPECIAL TREATMENT PROCEDURE:  The planned course of therapy using radiation constitutes a special treatment procedure. Special care is required in the management of this patient for the following reasons.  The patient will be  receiving concurrent chemotherapy requiring careful monitoring for increased toxicities of treatment including periodic laboratory values.  The special nature of the planned course of radiotherapy will require increased physician supervision and oversight to ensure patient's safety with optimal treatment outcomes.  PLAN:  The patient will receive 66 Gy in 33 fractions.  ________________________________  Sheral Apley Tammi Klippel, M.D.  This document serves as a record of services personally performed by Tyler Pita, MD. It was created on his behalf by Arlyce Harman, a trained medical scribe. The creation of this record is based on the scribe's personal observations and the provider's statements to them. This document has been checked and approved by the attending provider.

## 2015-08-09 NOTE — Telephone Encounter (Signed)
Per staff message and POF I have scheduled appts. Advised scheduler of appts and to move labs. JMW  

## 2015-08-13 ENCOUNTER — Other Ambulatory Visit: Payer: Medicare Other

## 2015-08-13 ENCOUNTER — Encounter: Payer: Self-pay | Admitting: *Deleted

## 2015-08-13 ENCOUNTER — Ambulatory Visit: Payer: Medicare Other | Admitting: Nutrition

## 2015-08-13 NOTE — Progress Notes (Signed)
54 year old female diagnosed with non-small cell lung cancer.  Past medical history includes pneumonia.  Medications include calcium with vitamin D, Colace, multivitamin, Compazine.  Labs include albumin 3.5.  Height: 68 inches. Weight: 220.2 pounds November 22. Usual body weight: 225 pounds November 8. BMI: 33.48.  Patient will be receiving concurrent chemoradiation therapy. Currently patient has no nutrition impact symptoms. Patient initially lost about 8 pounds secondary to diagnosis. Patient reports she now is eating well.  Nutrition diagnosis:  Food and nutrition related knowledge deficit related to new diagnosis of lung cancer as evidenced by no prior need for nutrition related information.  Intervention:  Educated patient on the importance of small frequent meals and snacks with adequate calories and protein to promote weight maintenance. Provided patient with a list of high-calorie, high-protein foods. Brief education provided on eating with nausea. Questions were answered.  Teach back method was used.  Contact information was provided.  Monitoring, evaluation, goals: Patient will tolerate adequate calories and protein to promote weight maintenance.  Next visit: Monday, December 5, during infusion.  **Disclaimer: This note was dictated with voice recognition software. Similar sounding words can inadvertently be transcribed and this note may contain transcription errors which may not have been corrected upon publication of note.**

## 2015-08-14 ENCOUNTER — Other Ambulatory Visit: Payer: Self-pay | Admitting: *Deleted

## 2015-08-14 DIAGNOSIS — C3490 Malignant neoplasm of unspecified part of unspecified bronchus or lung: Secondary | ICD-10-CM

## 2015-08-19 ENCOUNTER — Ambulatory Visit
Admission: RE | Admit: 2015-08-19 | Discharge: 2015-08-19 | Disposition: A | Discharge status: Home / Self Care | Payer: Medicare Other | Source: Ambulatory Visit | Attending: Radiation Oncology | Admitting: Radiation Oncology

## 2015-08-19 ENCOUNTER — Ambulatory Visit (HOSPITAL_BASED_OUTPATIENT_CLINIC_OR_DEPARTMENT_OTHER): Payer: Medicare Other

## 2015-08-19 ENCOUNTER — Other Ambulatory Visit (HOSPITAL_BASED_OUTPATIENT_CLINIC_OR_DEPARTMENT_OTHER): Payer: Medicare Other

## 2015-08-19 VITALS — BP 121/76 | HR 69 | Temp 98.1°F | Resp 16

## 2015-08-19 DIAGNOSIS — Z510 Encounter for antineoplastic radiation therapy: Secondary | ICD-10-CM | POA: Diagnosis not present

## 2015-08-19 DIAGNOSIS — Z5111 Encounter for antineoplastic chemotherapy: Secondary | ICD-10-CM | POA: Diagnosis present

## 2015-08-19 DIAGNOSIS — C3490 Malignant neoplasm of unspecified part of unspecified bronchus or lung: Secondary | ICD-10-CM

## 2015-08-19 DIAGNOSIS — C3491 Malignant neoplasm of unspecified part of right bronchus or lung: Secondary | ICD-10-CM

## 2015-08-19 LAB — COMPREHENSIVE METABOLIC PANEL (CC13)
ALK PHOS: 90 U/L (ref 40–150)
ALT: 12 U/L (ref 0–55)
AST: 20 U/L (ref 5–34)
Albumin: 3.4 g/dL — ABNORMAL LOW (ref 3.5–5.0)
Anion Gap: 10 mEq/L (ref 3–11)
BUN: 8.4 mg/dL (ref 7.0–26.0)
CHLORIDE: 108 meq/L (ref 98–109)
CO2: 23 mEq/L (ref 22–29)
Calcium: 9.9 mg/dL (ref 8.4–10.4)
Creatinine: 0.9 mg/dL (ref 0.6–1.1)
EGFR: 78 mL/min/{1.73_m2} — AB (ref >90)
GLUCOSE: 125 mg/dL (ref 70–140)
POTASSIUM: 3.8 meq/L (ref 3.5–5.1)
SODIUM: 140 meq/L (ref 136–145)
Total Bilirubin: 0.55 mg/dL (ref 0.20–1.20)
Total Protein: 8.2 g/dL (ref 6.4–8.3)

## 2015-08-19 LAB — CBC WITH DIFFERENTIAL/PLATELET
BASO%: 0.8 % (ref 0.0–2.0)
Basophils Absolute: 0 10*3/uL (ref 0.0–0.1)
EOS%: 1 % (ref 0.0–7.0)
Eosinophils Absolute: 0 10*3/uL (ref 0.0–0.5)
HCT: 34 % — ABNORMAL LOW (ref 34.8–46.6)
HGB: 11.3 g/dL — ABNORMAL LOW (ref 11.6–15.9)
LYMPH%: 26.1 % (ref 14.0–49.7)
MCH: 29 pg (ref 25.1–34.0)
MCHC: 33.2 g/dL (ref 31.5–36.0)
MCV: 87.4 fL (ref 79.5–101.0)
MONO#: 0.2 10*3/uL (ref 0.1–0.9)
MONO%: 6 % (ref 0.0–14.0)
NEUT%: 66.1 % (ref 38.4–76.8)
NEUTROS ABS: 2.6 10*3/uL (ref 1.5–6.5)
Platelets: 290 10*3/uL (ref 145–400)
RBC: 3.89 10*6/uL (ref 3.70–5.45)
RDW: 16.3 % — ABNORMAL HIGH (ref 11.2–14.5)
WBC: 4 10*3/uL (ref 3.9–10.3)
lymph#: 1 10*3/uL (ref 0.9–3.3)

## 2015-08-19 MED ORDER — SODIUM CHLORIDE 0.9 % IV SOLN
Freq: Once | INTRAVENOUS | Status: AC
  Administered 2015-08-19: 11:00:00 via INTRAVENOUS

## 2015-08-19 MED ORDER — SODIUM CHLORIDE 0.9 % IV SOLN
280.0000 mg | Freq: Once | INTRAVENOUS | Status: AC
  Administered 2015-08-19: 280 mg via INTRAVENOUS
  Filled 2015-08-19: qty 28

## 2015-08-19 MED ORDER — DIPHENHYDRAMINE HCL 50 MG/ML IJ SOLN
50.0000 mg | Freq: Once | INTRAMUSCULAR | Status: AC
  Administered 2015-08-19: 50 mg via INTRAVENOUS

## 2015-08-19 MED ORDER — SODIUM CHLORIDE 0.9 % IV SOLN
Freq: Once | INTRAVENOUS | Status: AC
  Administered 2015-08-19: 11:00:00 via INTRAVENOUS
  Filled 2015-08-19: qty 8

## 2015-08-19 MED ORDER — FAMOTIDINE IN NACL 20-0.9 MG/50ML-% IV SOLN
20.0000 mg | Freq: Once | INTRAVENOUS | Status: AC
  Administered 2015-08-19: 20 mg via INTRAVENOUS

## 2015-08-19 MED ORDER — PACLITAXEL CHEMO INJECTION 300 MG/50ML
45.0000 mg/m2 | Freq: Once | INTRAVENOUS | Status: AC
  Administered 2015-08-19: 96 mg via INTRAVENOUS
  Filled 2015-08-19: qty 16

## 2015-08-19 MED ORDER — DIPHENHYDRAMINE HCL 50 MG/ML IJ SOLN
INTRAMUSCULAR | Status: AC
  Filled 2015-08-19: qty 1

## 2015-08-19 MED ORDER — FAMOTIDINE IN NACL 20-0.9 MG/50ML-% IV SOLN
INTRAVENOUS | Status: AC
  Filled 2015-08-19: qty 50

## 2015-08-19 NOTE — Patient Instructions (Signed)
[name] Discharge Instructions for Patients Receiving Chemotherapy  Today you received the following chemotherapy agents taxol/carboplatin  To help prevent nausea and vomiting after your treatment, we encourage you to take your nausea medication as directed   If you develop nausea and vomiting that is not controlled by your nausea medication, call the clinic.   BELOW ARE SYMPTOMS THAT SHOULD BE REPORTED IMMEDIATELY:  *FEVER GREATER THAN 100.5 F  *CHILLS WITH OR WITHOUT FEVER  NAUSEA AND VOMITING THAT IS NOT CONTROLLED WITH YOUR NAUSEA MEDICATION  *UNUSUAL SHORTNESS OF BREATH  *UNUSUAL BRUISING OR BLEEDING  TENDERNESS IN MOUTH AND THROAT WITH OR WITHOUT PRESENCE OF ULCERS  *URINARY PROBLEMS  *BOWEL PROBLEMS  UNUSUAL RASH Items with * indicate a potential emergency and should be followed up as soon as possible.  Feel free to call the clinic you have any questions or concerns. The clinic phone number is [phone].  Paclitaxel injection What is this medicine? PACLITAXEL (PAK li TAX el) is a chemotherapy drug. It targets fast dividing cells, like cancer cells, and causes these cells to die. This medicine is used to treat ovarian cancer, breast cancer, and other cancers. This medicine may be used for other purposes; ask your health care provider or pharmacist if you have questions. What should I tell my health care provider before I take this medicine? They need to know if you have any of these conditions: -blood disorders -irregular heartbeat -infection (especially a virus infection such as chickenpox, cold sores, or herpes) -liver disease -previous or ongoing radiation therapy -an unusual or allergic reaction to paclitaxel, alcohol, polyoxyethylated castor oil, other chemotherapy agents, other medicines, foods, dyes, or preservatives -pregnant or trying to get pregnant -breast-feeding How should I use this medicine? This drug is given as an infusion  into a vein. It is administered in a hospital or clinic by a specially trained health care professional. Talk to your pediatrician regarding the use of this medicine in children. Special care may be needed. Overdosage: If you think you have taken too much of this medicine contact a poison control center or emergency room at once. NOTE: This medicine is only for you. Do not share this medicine with others. What if I miss a dose? It is important not to miss your dose. Call your doctor or health care professional if you are unable to keep an appointment. What may interact with this medicine? Do not take this medicine with any of the following medications: -disulfiram -metronidazole This medicine may also interact with the following medications: -cyclosporine -diazepam -ketoconazole -medicines to increase blood counts like filgrastim, pegfilgrastim, sargramostim -other chemotherapy drugs like cisplatin, doxorubicin, epirubicin, etoposide, teniposide, vincristine -quinidine -testosterone -vaccines -verapamil Talk to your doctor or health care professional before taking any of these medicines: -acetaminophen -aspirin -ibuprofen -ketoprofen -naproxen This list may not describe all possible interactions. Give your health care provider a list of all the medicines, herbs, non-prescription drugs, or dietary supplements you use. Also tell them if you smoke, drink alcohol, or use illegal drugs. Some items may interact with your medicine. What should I watch for while using this medicine? Your condition will be monitored carefully while you are receiving this medicine. You will need important blood work done while you are taking this medicine. This drug may make you feel generally unwell. This is not uncommon, as chemotherapy can affect healthy cells as well as cancer cells. Report any side effects. Continue your course of treatment even though you feel ill unless  your doctor tells you to stop. This  medicine can cause serious allergic reactions. To reduce your risk you will need to take other medicine(s) before treatment with this medicine. In some cases, you may be given additional medicines to help with side effects. Follow all directions for their use. Call your doctor or health care professional for advice if you get a fever, chills or sore throat, or other symptoms of a cold or flu. Do not treat yourself. This drug decreases your body's ability to fight infections. Try to avoid being around people who are sick. This medicine may increase your risk to bruise or bleed. Call your doctor or health care professional if you notice any unusual bleeding. Be careful brushing and flossing your teeth or using a toothpick because you may get an infection or bleed more easily. If you have any dental work done, tell your dentist you are receiving this medicine. Avoid taking products that contain aspirin, acetaminophen, ibuprofen, naproxen, or ketoprofen unless instructed by your doctor. These medicines may hide a fever. Do not become pregnant while taking this medicine. Women should inform their doctor if they wish to become pregnant or think they might be pregnant. There is a potential for serious side effects to an unborn child. Talk to your health care professional or pharmacist for more information. Do not breast-feed an infant while taking this medicine. Men are advised not to father a child while receiving this medicine. This product may contain alcohol. Ask your pharmacist or healthcare provider if this medicine contains alcohol. Be sure to tell all healthcare providers you are taking this medicine. Certain medicines, like metronidazole and disulfiram, can cause an unpleasant reaction when taken with alcohol. The reaction includes flushing, headache, nausea, vomiting, sweating, and increased thirst. The reaction can last from 30 minutes to several hours. What side effects may I notice from receiving this  medicine? Side effects that you should report to your doctor or health care professional as soon as possible: -allergic reactions like skin rash, itching or hives, swelling of the face, lips, or tongue -low blood counts - This drug may decrease the number of white blood cells, red blood cells and platelets. You may be at increased risk for infections and bleeding. -signs of infection - fever or chills, cough, sore throat, pain or difficulty passing urine -signs of decreased platelets or bleeding - bruising, pinpoint red spots on the skin, black, tarry stools, nosebleeds -signs of decreased red blood cells - unusually weak or tired, fainting spells, lightheadedness -breathing problems -chest pain -high or low blood pressure -mouth sores -nausea and vomiting -pain, swelling, redness or irritation at the injection site -pain, tingling, numbness in the hands or feet -slow or irregular heartbeat -swelling of the ankle, feet, hands Side effects that usually do not require medical attention (report to your doctor or health care professional if they continue or are bothersome): -bone pain -complete hair loss including hair on your head, underarms, pubic hair, eyebrows, and eyelashes -changes in the color of fingernails -diarrhea -loosening of the fingernails -loss of appetite -muscle or joint pain -red flush to skin -sweating This list may not describe all possible side effects. Call your doctor for medical advice about side effects. You may report side effects to FDA at 1-800-FDA-1088. Where should I keep my medicine? This drug is given in a hospital or clinic and will not be stored at home. NOTE: This sheet is a summary. It may not cover all possible information. If you have questions  about this medicine, talk to your doctor, pharmacist, or health care provider.    2016, [employer] Standard. (2015-04-25 13:02:56)  Carboplatin injection What is this medicine? CARBOPLATIN (KAR boe pla  tin) is a chemotherapy drug. It targets fast dividing cells, like cancer cells, and causes these cells to die. This medicine is used to treat ovarian cancer and many other cancers. This medicine may be used for other purposes; ask your health care provider or pharmacist if you have questions. What should I tell my health care provider before I take this medicine? They need to know if you have any of these conditions: -blood disorders -hearing problems -kidney disease -recent or ongoing radiation therapy -an unusual or allergic reaction to carboplatin, cisplatin, other chemotherapy, other medicines, foods, dyes, or preservatives -pregnant or trying to get pregnant -breast-feeding How should I use this medicine? This drug is usually given as an infusion into a vein. It is administered in a hospital or clinic by a specially trained health care professional. Talk to your pediatrician regarding the use of this medicine in children. Special care may be needed. Overdosage: If you think you have taken too much of this medicine contact a poison control center or emergency room at once. NOTE: This medicine is only for you. Do not share this medicine with others. What if I miss a dose? It is important not to miss a dose. Call your doctor or health care professional if you are unable to keep an appointment. What may interact with this medicine? -medicines for seizures -medicines to increase blood counts like filgrastim, pegfilgrastim, sargramostim -some antibiotics like amikacin, gentamicin, neomycin, streptomycin, tobramycin -vaccines Talk to your doctor or health care professional before taking any of these medicines: -acetaminophen -aspirin -ibuprofen -ketoprofen -naproxen This list may not describe all possible interactions. Give your health care provider a list of all the medicines, herbs, non-prescription drugs, or dietary supplements you use. Also tell them if you smoke, drink alcohol, or use  illegal drugs. Some items may interact with your medicine. What should I watch for while using this medicine? Your condition will be monitored carefully while you are receiving this medicine. You will need important blood work done while you are taking this medicine. This drug may make you feel generally unwell. This is not uncommon, as chemotherapy can affect healthy cells as well as cancer cells. Report any side effects. Continue your course of treatment even though you feel ill unless your doctor tells you to stop. In some cases, you may be given additional medicines to help with side effects. Follow all directions for their use. Call your doctor or health care professional for advice if you get a fever, chills or sore throat, or other symptoms of a cold or flu. Do not treat yourself. This drug decreases your body's ability to fight infections. Try to avoid being around people who are sick. This medicine may increase your risk to bruise or bleed. Call your doctor or health care professional if you notice any unusual bleeding. Be careful brushing and flossing your teeth or using a toothpick because you may get an infection or bleed more easily. If you have any dental work done, tell your dentist you are receiving this medicine. Avoid taking products that contain aspirin, acetaminophen, ibuprofen, naproxen, or ketoprofen unless instructed by your doctor. These medicines may hide a fever. Do not become pregnant while taking this medicine. Women should inform their doctor if they wish to become pregnant or think they might be pregnant. There  is a potential for serious side effects to an unborn child. Talk to your health care professional or pharmacist for more information. Do not breast-feed an infant while taking this medicine. What side effects may I notice from receiving this medicine? Side effects that you should report to your doctor or health care professional as soon as possible: -allergic  reactions like skin rash, itching or hives, swelling of the face, lips, or tongue -signs of infection - fever or chills, cough, sore throat, pain or difficulty passing urine -signs of decreased platelets or bleeding - bruising, pinpoint red spots on the skin, black, tarry stools, nosebleeds -signs of decreased red blood cells - unusually weak or tired, fainting spells, lightheadedness -breathing problems -changes in hearing -changes in vision -chest pain -high blood pressure -low blood counts - This drug may decrease the number of white blood cells, red blood cells and platelets. You may be at increased risk for infections and bleeding. -nausea and vomiting -pain, swelling, redness or irritation at the injection site -pain, tingling, numbness in the hands or feet -problems with balance, talking, walking -trouble passing urine or change in the amount of urine Side effects that usually do not require medical attention (report to your doctor or health care professional if they continue or are bothersome): -hair loss -loss of appetite -metallic taste in the mouth or changes in taste This list may not describe all possible side effects. Call your doctor for medical advice about side effects. You may report side effects to FDA at 1-800-FDA-1088. Where should I keep my medicine? This drug is given in a hospital or clinic and will not be stored at home. NOTE: This sheet is a summary. It may not cover all possible information. If you have questions about this medicine, talk to your doctor, pharmacist, or health care provider.    2016, [employer] Standard. (2007-12-13 14:38:05)

## 2015-08-20 ENCOUNTER — Ambulatory Visit
Admission: RE | Admit: 2015-08-20 | Discharge: 2015-08-20 | Disposition: A | Discharge status: Home / Self Care | Payer: Medicare Other | Source: Ambulatory Visit | Attending: Radiation Oncology | Admitting: Radiation Oncology

## 2015-08-20 DIAGNOSIS — C3491 Malignant neoplasm of unspecified part of right bronchus or lung: Secondary | ICD-10-CM | POA: Diagnosis not present

## 2015-08-20 DIAGNOSIS — Z510 Encounter for antineoplastic radiation therapy: Secondary | ICD-10-CM | POA: Diagnosis not present

## 2015-08-21 ENCOUNTER — Telehealth: Payer: Self-pay | Admitting: Radiation Oncology

## 2015-08-21 ENCOUNTER — Ambulatory Visit
Admission: RE | Admit: 2015-08-21 | Discharge: 2015-08-21 | Disposition: A | Discharge status: Home / Self Care | Payer: Medicare Other | Source: Ambulatory Visit | Attending: Radiation Oncology | Admitting: Radiation Oncology

## 2015-08-21 DIAGNOSIS — C3491 Malignant neoplasm of unspecified part of right bronchus or lung: Secondary | ICD-10-CM | POA: Diagnosis not present

## 2015-08-21 DIAGNOSIS — Z510 Encounter for antineoplastic radiation therapy: Secondary | ICD-10-CM | POA: Diagnosis not present

## 2015-08-21 NOTE — Telephone Encounter (Signed)
Understand from the therapist the patient is concerned about the incorrect phone numbers in her chart. Attempted to reach patient via phone for clarification. No answer. Will reach out to patient when she presents again for radiation treatment.

## 2015-08-22 ENCOUNTER — Ambulatory Visit
Admission: RE | Admit: 2015-08-22 | Discharge: 2015-08-22 | Disposition: A | Discharge status: Home / Self Care | Payer: Medicare Other | Source: Ambulatory Visit | Attending: Radiation Oncology | Admitting: Radiation Oncology

## 2015-08-22 ENCOUNTER — Telehealth: Payer: Self-pay | Admitting: *Deleted

## 2015-08-22 VITALS — BP 136/88 | HR 94 | Temp 98.2°F | Resp 16 | Wt 219.0 lb

## 2015-08-22 DIAGNOSIS — Z510 Encounter for antineoplastic radiation therapy: Secondary | ICD-10-CM | POA: Diagnosis not present

## 2015-08-22 DIAGNOSIS — C3490 Malignant neoplasm of unspecified part of unspecified bronchus or lung: Secondary | ICD-10-CM

## 2015-08-22 DIAGNOSIS — C3491 Malignant neoplasm of unspecified part of right bronchus or lung: Secondary | ICD-10-CM | POA: Diagnosis not present

## 2015-08-22 MED ORDER — RADIAPLEXRX EX GEL
Freq: Once | CUTANEOUS | Status: AC
  Administered 2015-08-22: 17:00:00 via TOPICAL

## 2015-08-22 NOTE — Progress Notes (Signed)
Oriented patient to staff and routine of the clinic. Provided patient with RADIATION THERAPY AND YOU handbook then, directed upon use. Educated patient reference potential side effect and management such as fatigue, skin changes, and throat changes. Provided patient with radiaplex and directed upon use. Afforded patient opportunity to ask questions and answered those to the best of my ability. Patient verbalized understanding of all reviewed.

## 2015-08-22 NOTE — Progress Notes (Signed)
Requested [name], RT on L3 send patient to nursing for evaluation following treatment if she expresses any concerns.

## 2015-08-22 NOTE — Telephone Encounter (Signed)
"  I'm calling to let Dr. Julien Nordmann know I'm hot all the time from the waist up.  For the past two days, all day, I'm sweating since I started radiation.  I mentioned to RT that I am feeling hot.  Return number [id number]."    Denies fever or redness/rash to upper body.  Ist carboplatin/Taxol received 08-19-2015.  Will notify Dr. Julien Nordmann and Radiation.  Today's RT is 4:10 pm.

## 2015-08-23 ENCOUNTER — Encounter: Payer: Self-pay | Admitting: Radiation Oncology

## 2015-08-23 ENCOUNTER — Ambulatory Visit
Admission: RE | Admit: 2015-08-23 | Discharge: 2015-08-23 | Disposition: A | Discharge status: Home / Self Care | Payer: Medicare Other | Source: Ambulatory Visit | Attending: Radiation Oncology | Admitting: Radiation Oncology

## 2015-08-23 VITALS — BP 114/86 | HR 106 | Resp 16

## 2015-08-23 DIAGNOSIS — C342 Malignant neoplasm of middle lobe, bronchus or lung: Secondary | ICD-10-CM

## 2015-08-23 DIAGNOSIS — C3491 Malignant neoplasm of unspecified part of right bronchus or lung: Secondary | ICD-10-CM | POA: Diagnosis not present

## 2015-08-23 DIAGNOSIS — Z510 Encounter for antineoplastic radiation therapy: Secondary | ICD-10-CM | POA: Diagnosis not present

## 2015-08-23 NOTE — Telephone Encounter (Signed)
Hydration and monitoring for now.

## 2015-08-23 NOTE — Progress Notes (Signed)
Weight and vitals stable. Denies pain. Reports warm sensation from the waist up has resolved. Scheduled for chemotherapy again on Monday. Reports increased frequency of dry cough. Denies throat pain or difficulty swallowing. Reports SOB with great exertion only. Reports difficulty sleeping.  BP 114/86 mmHg  Pulse 106  Resp 16  SpO2 100% Wt Readings from Last 3 Encounters:  08/22/15 219 lb (99.338 kg)  08/13/15 220 lb 3.2 oz (99.882 kg)  08/08/15 218 lb 4.8 oz (99.02 kg)

## 2015-08-23 NOTE — Progress Notes (Signed)
  Radiation Oncology         [phone]   Name: Theresa [name] MRN: [id number]   Date: 08/23/2015  DOB: 10-11-1960     Weekly Radiation Therapy Management    ICD-9-CM ICD-10-CM   1. 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  Weight and vitals stable. Denies pain. Reports warm sensation from the waist up has resolved. Scheduled for chemotherapy again on Monday. Reports increased frequency of dry cough. Denies throat pain or difficulty swallowing. Reports SOB with great exertion only. Reports difficulty sleeping attributed to discomfort from being on table during treatment. She has begun to feel a lump in her throat while swallowing. The patient has tussionex prescribed at the emergency room. Patient continues to take calcium and centrum silver.  The patient is without complaint. Set-up films were reviewed. The chart was checked.  Physical Findings  blood pressure is 114/86 and her pulse is 106. Her respiration is 16 and oxygen saturation is 100%. . Weight essentially stable.  No significant changes.  Impression The patient is tolerating radiation.  Plan Continue treatment as planned. Encouraged the patient to try Maalox or Mylanta to manage difficulty swallowing. Encouraged the patient to continue taking tussionex for cough.         Theresa [name] Theresa [name], M.D.  This document serves as a record of services personally performed by Tyler Pita, MD. It was created on his behalf by Arlyce Harman, a trained medical scribe. The creation of this record is based on the scribe's personal observations and the provider's statements to them. This document has been checked and approved by the attending provider.

## 2015-08-26 ENCOUNTER — Ambulatory Visit: Payer: Medicare Other | Admitting: Nutrition

## 2015-08-26 ENCOUNTER — Other Ambulatory Visit (HOSPITAL_BASED_OUTPATIENT_CLINIC_OR_DEPARTMENT_OTHER): Payer: Medicare Other

## 2015-08-26 ENCOUNTER — Ambulatory Visit
Admission: RE | Admit: 2015-08-26 | Discharge: 2015-08-26 | Disposition: A | Discharge status: Home / Self Care | Payer: Medicare Other | Source: Ambulatory Visit | Attending: Radiation Oncology | Admitting: Radiation Oncology

## 2015-08-26 ENCOUNTER — Ambulatory Visit (HOSPITAL_BASED_OUTPATIENT_CLINIC_OR_DEPARTMENT_OTHER): Payer: Medicare Other

## 2015-08-26 ENCOUNTER — Other Ambulatory Visit: Payer: Self-pay | Admitting: Medical Oncology

## 2015-08-26 ENCOUNTER — Ambulatory Visit: Payer: Medicare Other | Admitting: Physician Assistant

## 2015-08-26 VITALS — BP 134/75 | HR 110 | Temp 98.5°F | Resp 18

## 2015-08-26 DIAGNOSIS — Z5111 Encounter for antineoplastic chemotherapy: Secondary | ICD-10-CM | POA: Diagnosis not present

## 2015-08-26 DIAGNOSIS — C3491 Malignant neoplasm of unspecified part of right bronchus or lung: Secondary | ICD-10-CM

## 2015-08-26 DIAGNOSIS — Z510 Encounter for antineoplastic radiation therapy: Secondary | ICD-10-CM | POA: Diagnosis not present

## 2015-08-26 LAB — COMPREHENSIVE METABOLIC PANEL
ALT: 17 U/L (ref 0–55)
AST: 35 U/L — AB (ref 5–34)
Albumin: 3.5 g/dL (ref 3.5–5.0)
Alkaline Phosphatase: 90 U/L (ref 40–150)
Anion Gap: 10 mEq/L (ref 3–11)
BUN: 10.1 mg/dL (ref 7.0–26.0)
CHLORIDE: 105 meq/L (ref 98–109)
CO2: 23 meq/L (ref 22–29)
Calcium: 9.7 mg/dL (ref 8.4–10.4)
Creatinine: 0.8 mg/dL (ref 0.6–1.1)
EGFR: 83 mL/min/{1.73_m2} — ABNORMAL LOW (ref >90)
GLUCOSE: 99 mg/dL (ref 70–140)
POTASSIUM: 3.6 meq/L (ref 3.5–5.1)
SODIUM: 138 meq/L (ref 136–145)
Total Bilirubin: 0.49 mg/dL (ref 0.20–1.20)
Total Protein: 8.1 g/dL (ref 6.4–8.3)

## 2015-08-26 LAB — CBC WITH DIFFERENTIAL/PLATELET
BASO%: 1.2 % (ref 0.0–2.0)
Basophils Absolute: 0 10*3/uL (ref 0.0–0.1)
EOS%: 0.8 % (ref 0.0–7.0)
Eosinophils Absolute: 0 10*3/uL (ref 0.0–0.5)
HCT: 32.4 % — ABNORMAL LOW (ref 34.8–46.6)
HEMOGLOBIN: 10.8 g/dL — AB (ref 11.6–15.9)
LYMPH#: 0.4 10*3/uL — AB (ref 0.9–3.3)
LYMPH%: 15.9 % (ref 14.0–49.7)
MCH: 29 pg (ref 25.1–34.0)
MCHC: 33.3 g/dL (ref 31.5–36.0)
MCV: 86.9 fL (ref 79.5–101.0)
MONO#: 0.2 10*3/uL (ref 0.1–0.9)
MONO%: 7.8 % (ref 0.0–14.0)
NEUT#: 1.9 10*3/uL (ref 1.5–6.5)
NEUT%: 74.3 % (ref 38.4–76.8)
Platelets: 262 10*3/uL (ref 145–400)
RBC: 3.73 10*6/uL (ref 3.70–5.45)
RDW: 16 % — ABNORMAL HIGH (ref 11.2–14.5)
WBC: 2.6 10*3/uL — ABNORMAL LOW (ref 3.9–10.3)
nRBC: 0 % (ref 0–0)

## 2015-08-26 MED ORDER — PACLITAXEL CHEMO INJECTION 300 MG/50ML
45.0000 mg/m2 | Freq: Once | INTRAVENOUS | Status: AC
  Administered 2015-08-26: 96 mg via INTRAVENOUS
  Filled 2015-08-26: qty 16

## 2015-08-26 MED ORDER — DIPHENHYDRAMINE HCL 50 MG/ML IJ SOLN
50.0000 mg | Freq: Once | INTRAMUSCULAR | Status: AC
  Administered 2015-08-26: 50 mg via INTRAVENOUS

## 2015-08-26 MED ORDER — FAMOTIDINE IN NACL 20-0.9 MG/50ML-% IV SOLN
20.0000 mg | Freq: Once | INTRAVENOUS | Status: AC
  Administered 2015-08-26: 20 mg via INTRAVENOUS

## 2015-08-26 MED ORDER — SODIUM CHLORIDE 0.9 % IV SOLN
Freq: Once | INTRAVENOUS | Status: AC
  Administered 2015-08-26: 14:00:00 via INTRAVENOUS

## 2015-08-26 MED ORDER — SODIUM CHLORIDE 0.9 % IV SOLN
276.0000 mg | Freq: Once | INTRAVENOUS | Status: AC
  Administered 2015-08-26: 280 mg via INTRAVENOUS
  Filled 2015-08-26: qty 28

## 2015-08-26 MED ORDER — SODIUM CHLORIDE 0.9 % IV SOLN
Freq: Once | INTRAVENOUS | Status: AC
  Administered 2015-08-26: 14:00:00 via INTRAVENOUS
  Filled 2015-08-26: qty 8

## 2015-08-26 MED ORDER — FAMOTIDINE IN NACL 20-0.9 MG/50ML-% IV SOLN
INTRAVENOUS | Status: AC
  Filled 2015-08-26: qty 50

## 2015-08-26 MED ORDER — DIPHENHYDRAMINE HCL 50 MG/ML IJ SOLN
INTRAMUSCULAR | Status: AC
  Filled 2015-08-26: qty 1

## 2015-08-26 NOTE — Progress Notes (Signed)
Pt arrived late for APP visit, will need to be rescheduled. Noted order entered for scheduling. APP visit not scheduled as of yet. Pt instructed to see scheduling on 12/6 if she does not hear from [place] before next radiation visit. She voiced understanding.

## 2015-08-26 NOTE — Progress Notes (Signed)
Nutrition follow-up was completed with patient during infusion.  patient denies nutrition impact symptoms.  she states she has a good appetite   weight is stable at 219 pounds, December 1   patient reports she drinks plenty of fluids   Nutrition diagnosis:   food and nutrition related knowledge deficit improved   Intervention:   encouraged patient to continue small frequent meals and snacks to promote weight maintenance  Teach back method used.  Monitoring, evaluation, goals: patient will tolerate adequate calories and protein to maintain lean body mass.  Next visit:  Follow-up is not required at this time.  Patient has my contact information for questions or concerns.  **Disclaimer: This note was dictated with voice recognition software. Similar sounding words can inadvertently be transcribed and this note may contain transcription errors which may not have been corrected upon publication of note.**

## 2015-08-26 NOTE — Patient Instructions (Signed)
[facility] Discharge Instructions for Patients Receiving Chemotherapy  Today you received the following chemotherapy agents: Taxol and Carboplatin.  To help prevent nausea and vomiting after your treatment, we encourage you to take your nausea medication: Compazine. Take one every 6 hours as needed. If you develop nausea and vomiting that is not controlled by your nausea medication, call the clinic.   BELOW ARE SYMPTOMS THAT SHOULD BE REPORTED IMMEDIATELY:  *FEVER GREATER THAN 100.5 F  *CHILLS WITH OR WITHOUT FEVER  NAUSEA AND VOMITING THAT IS NOT CONTROLLED WITH YOUR NAUSEA MEDICATION  *UNUSUAL SHORTNESS OF BREATH  *UNUSUAL BRUISING OR BLEEDING  TENDERNESS IN MOUTH AND THROAT WITH OR WITHOUT PRESENCE OF ULCERS  *URINARY PROBLEMS  *BOWEL PROBLEMS  UNUSUAL RASH Items with * indicate a potential emergency and should be followed up as soon as possible.  Feel free to call the clinic should you have any questions or concerns. The clinic phone number is [phone].  Please show the CHEMO ALERT CARD at check-in to the Emergency Department and triage nurse.   

## 2015-08-27 ENCOUNTER — Telehealth: Payer: Self-pay | Admitting: Internal Medicine

## 2015-08-27 ENCOUNTER — Ambulatory Visit
Admission: RE | Admit: 2015-08-27 | Discharge: 2015-08-27 | Disposition: A | Discharge status: Home / Self Care | Payer: Medicare Other | Source: Ambulatory Visit | Attending: Radiation Oncology | Admitting: Radiation Oncology

## 2015-08-27 ENCOUNTER — Encounter: Payer: Self-pay | Admitting: Physician Assistant

## 2015-08-27 ENCOUNTER — Ambulatory Visit (HOSPITAL_BASED_OUTPATIENT_CLINIC_OR_DEPARTMENT_OTHER): Payer: Medicare Other | Admitting: Physician Assistant

## 2015-08-27 ENCOUNTER — Other Ambulatory Visit: Payer: Self-pay | Admitting: Physician Assistant

## 2015-08-27 VITALS — BP 136/71 | HR 89 | Temp 98.0°F | Resp 18 | Ht 68.0 in | Wt 221.4 lb

## 2015-08-27 DIAGNOSIS — C342 Malignant neoplasm of middle lobe, bronchus or lung: Secondary | ICD-10-CM

## 2015-08-27 DIAGNOSIS — C3401 Malignant neoplasm of right main bronchus: Secondary | ICD-10-CM

## 2015-08-27 DIAGNOSIS — R110 Nausea: Secondary | ICD-10-CM | POA: Diagnosis not present

## 2015-08-27 DIAGNOSIS — C3491 Malignant neoplasm of unspecified part of right bronchus or lung: Secondary | ICD-10-CM | POA: Diagnosis not present

## 2015-08-27 DIAGNOSIS — M545 Low back pain: Secondary | ICD-10-CM | POA: Diagnosis not present

## 2015-08-27 DIAGNOSIS — Z510 Encounter for antineoplastic radiation therapy: Secondary | ICD-10-CM | POA: Diagnosis not present

## 2015-08-27 DIAGNOSIS — Z5111 Encounter for antineoplastic chemotherapy: Secondary | ICD-10-CM

## 2015-08-27 HISTORY — DX: Encounter for antineoplastic chemotherapy: Z51.11

## 2015-08-27 MED ORDER — HYDROCOD POLST-CPM POLST ER 10-8 MG/5ML PO SUER: 5.0000 mL | Freq: Two times a day (BID) | ORAL | Status: DC | PRN

## 2015-08-27 NOTE — Telephone Encounter (Signed)
NOT ABLE TO REACH PATIENT RE F/U WITH SW TODAY - PHONE NUMBER MAY BE INVALID. ADDED F/U FOR AFTER XRT AND A COMMENT IN XRT APPOINTMENT TO SEND PATIENT TO SCHEDULING UPON ARRIVAL TO BE INFORMED OF F/U IN [place].

## 2015-08-27 NOTE — Progress Notes (Signed)
[place] [phone]   [phone] Multidisciplinary thoracic oncology clinic  OFFICE PROGRESS NOTE  No PCP Per Patient No address on file  DIAGNOSIS: Stage IIIa (T2b, N2, M0) non-small cell lung cancer, invasive poorly differentiated squamous cell carcinoma diagnosed in November 2016 and presented with large right hilar mass with collapse of the right middle lobe and extension into the right upper lobe and right lower lobe along the hilum with right paratracheal and subcarinal lymphadenopathy. PDL 1 testing is still pending.  PRIOR THERAPY: None  CURRENT THERAPY: Concurrent chemoradiation with weekly carboplatin for AUC of 2 and paclitaxel 45 MG/M2. First dose 08/19/2015  INTERVAL HISTORY: Theresa [name] 54 y.o. female returns to the clinic toda following her day 1 cycle 2 of chemotherapy with carbo/taxol gven yesterday, as the patient missed the appointment and had this rescheduled for today. The patient is feeling fine today with no specific complaints.She tolerated her chemotherapy well without any significant side effects. She reports that now she can swallow without difficulty. She continues to have mild cough and shortness of breath with exertion. She denied having any significant hemoptysis. The patient denied having any significant weight loss or night sweats. She has no nausea or vomiting.Her back pain is well-controlled with medications. She is tolerating her Radiation without complaints.  MEDICAL HISTORY: Past Medical History  Diagnosis Date  . Pneumonia   . Cancer ([place])     5 years ago - cervical cancer    ALLERGIES:  has No Known Allergies.  MEDICATIONS:  Current Outpatient Prescriptions  Medication Sig Dispense Refill  . AFLURIA PRESERVATIVE FREE 0.5 ML SUSY inject 0.5 milliliter intramuscularly  0  . Calcium Carb-Cholecalciferol (CALCIUM 600 + D) 600-200 MG-UNIT TABS Take 2 capsules by mouth daily.    Marland Kitchen docusate sodium  (COLACE) 100 MG capsule Take by mouth.    Marland Kitchen ibuprofen (ADVIL,MOTRIN) 800 MG tablet Take by mouth.    . Multiple Vitamin (MULTIVITAMIN WITH MINERALS) TABS tablet Take 1 tablet by mouth daily.    . Naproxen Sodium (ALEVE) 220 MG CAPS Take by mouth.    . naproxen sodium (ANAPROX) 220 MG tablet Take 440 mg by mouth 2 (two) times daily as needed (pain).    Marland Kitchen oxyCODONE-acetaminophen (PERCOCET/ROXICET) 5-325 MG tablet Take 1 tablet by mouth every 6 (six) hours as needed for severe pain. 40 tablet 0  . prochlorperazine (COMPAZINE) 10 MG tablet Take 1 tablet (10 mg total) by mouth every 6 (six) hours as needed for nausea or vomiting. 30 tablet 0  . Wound Cleansers (RADIAPLEX EX) Apply topically.     No current facility-administered medications for this visit.    SURGICAL HISTORY:  Past Surgical History  Procedure Laterality Date  . Wisdom tooth extraction    . Tubal ligation    . Video bronchoscopy with endobronchial ultrasound N/A 08/02/2015    Procedure: VIDEO BRONCHOSCOPY WITH ENDOBRONCHIAL ULTRASOUND;  Surgeon: [name], MD;  Location: [facility];  Service: Thoracic;  Laterality: N/A;    REVIEW OF SYSTEMS:  Constitutional: negative Eyes: negative Ears, nose, mouth, throat, and face: negative Respiratory: positive for cough and dyspnea on exertion Cardiovascular: negative Gastrointestinal: negative Genitourinary:negative Integument/breast: negative Hematologic/lymphatic: negative Musculoskeletal:positive for back pain Neurological: negative Behavioral/Psych: negative Endocrine: negative Allergic/Immunologic: negative   PHYSICAL EXAMINATION: General appearance: alert, cooperative and no distress Head: Normocephalic, without obvious abnormality, atraumatic Neck: no adenopathy, no JVD, supple, symmetrical, trachea midline and thyroid not enlarged, symmetric, no tenderness/mass/nodules Lymph nodes:  Cervical, supraclavicular, and axillary nodes normal. Resp: clear to auscultation  bilaterally Back: symmetric, no curvature. ROM normal. No CVA tenderness. Cardio: regular rate and rhythm, S1, S2 normal, no murmur, click, rub or gallop GI: soft, non-tender; bowel sounds normal; no masses,  no organomegaly Extremities: extremities normal, atraumatic, no cyanosis or edema Neurologic: Alert and oriented X 3, normal strength and tone. Normal symmetric reflexes. Normal coordination and gait  ECOG PERFORMANCE STATUS: 1 - Symptomatic but completely ambulatory  There were no vitals taken for this visit.  LABORATORY DATA: CBC Latest Ref Rng 08/26/2015 08/19/2015 08/08/2015  WBC 3.9 - 10.3 10e3/uL 2.6(L) 4.0 6.1  Hemoglobin 11.6 - 15.9 g/dL 10.8(L) 11.3(L) 11.4(L)  Hematocrit 34.8 - 46.6 % 32.4(L) 34.0(L) 34.2(L)  Platelets 145 - 400 10e3/uL 262 290 390   CMP Latest Ref Rng 08/26/2015 08/19/2015 08/08/2015  Glucose 70 - 140 mg/dl 99 125 136  BUN 7.0 - 26.0 mg/dL 10.1 8.4 8.4  Creatinine 0.6 - 1.1 mg/dL 0.8 0.9 0.9  Sodium 136 - 145 mEq/L 138 140 139  Potassium 3.5 - 5.1 mEq/L 3.6 3.8 3.8  Chloride 101 - 111 mmol/L - - -  CO2 22 - 29 mEq/L '23 23 25  '$ Calcium 8.4 - 10.4 mg/dL 9.7 9.9 10.3  Total Protein 6.4 - 8.3 g/dL 8.1 8.2 8.5(H)  Total Bilirubin 0.20 - 1.20 mg/dL 0.49 0.55 0.46  Alkaline Phos 40 - 150 U/L 90 90 95  AST 5 - 34 U/L 35(H) 20 24  ALT 0 - 55 U/L '17 12 12   '$ ANC is 1.9  RADIOGRAPHIC STUDIES: Dg Chest 2 View  08/01/2015  CLINICAL DATA:  The 0 bronchoscopy with endobronchial ultrasound for a right hilar mass and mediastinal lymphadenopathy. EXAM: CHEST  2 VIEW COMPARISON:  PA and lateral chest x-ray of July 15, 2015 FINDINGS: There is persistent right-sided hilar mass. There is no pleural effusion. There is no mediastinal shift. There is no pneumothorax. The left lung is clear. The heart and pulmonary vascularity are normal. Mild soft tissue fullness of the right paratracheal region is stable. There is gentle S-shaped thoracolumbar curvature. IMPRESSION:  Persistent large right hilar mass. There may be right paratracheal lymphadenopathy. If the patient has already undergone the video bronchoscopy, no postprocedure complication is observed. Electronically Signed   By: David  Martinique M.D.   On: 08/01/2015 10:12   Mr Jeri Cos WF Contrast  08/07/2015  CLINICAL DATA:  New diagnosis of squamous cell cancer. Evaluate for possible brain Mets. Staging. EXAM: MRI HEAD WITHOUT AND WITH CONTRAST TECHNIQUE: Multiplanar, multiecho pulse sequences of the brain and surrounding structures were obtained without and with intravenous contrast. CONTRAST:  77m MULTIHANCE GADOBENATE DIMEGLUMINE 529 MG/ML IV SOLN COMPARISON:  CT head 06/04/2008. FINDINGS: No evidence for acute infarction, hemorrhage, mass lesion, hydrocephalus, or extra-axial fluid. Slight premature for age cerebral and cerebellar atrophy. Minor subcortical white matter disease, nonspecific. Pituitary, pineal, and cerebellar tonsils unremarkable. Flow voids are maintained throughout the carotid, basilar, and vertebral arteries. There are no areas of chronic hemorrhage. Post infusion, no abnormal enhancement of the brain or meninges. Major dural venous sinuses are patent. Visualized calvarium, skull base, and upper cervical osseous structures unremarkable. Scalp and extracranial soft tissues, orbits, sinuses, and mastoids show no acute process. Mild progression of atrophy is suspected compared with prior CT. IMPRESSION: Mild atrophy.  Minor white matter disease, nonspecific. No acute intracranial findings. Electronically Signed   By: JStaci RighterM.D.   On: 08/07/2015 07:53   Nm Pet  Image Initial (pi) Skull Base To Thigh  08/07/2015  CLINICAL DATA:  Initial treatment strategy for right squamous cell carcinoma. EXAM: NUCLEAR MEDICINE PET SKULL BASE TO THIGH TECHNIQUE: 12.5 mCi F-18 FDG was injected intravenously. Full-ring PET imaging was performed from the skull base to thigh after the radiotracer. CT data was  obtained and used for attenuation correction and anatomic localization. FASTING BLOOD GLUCOSE:  Value: 109 mg/dl COMPARISON:  Multiple exams, including 07/12/2015 FINDINGS: NECK Relatively symmetric hypermetabolic tissue in the tonsillar pillars, 9.4 on the left and SUV max of 11.1 on the right. CHEST Right hilar mass with associated right middle lobe collapse and probable extension into the right upper lobe and right lower lobe along the hilum, region of hypermetabolic activity measuring 6.0 by 5.0 cm, maximum standard uptake value 22.9. Subcarinal lymph node measuring 2.0 cm in short axis on image 66 series 4 maximum standard uptake value 17.3. Multifocal right paratracheal adenopathy, lower paratracheal node measuring 2 cm in short axis on image 58 series 4 with maximum standard uptake value 15.2. Other hypermetabolic paratracheal lymph nodes are present on the right. No left-sided disease. 1.3 by 0.8 cm right upper lobe nodule on image 52 series 4, maximum standard uptake value 2.9. ABDOMEN/PELVIS High activity in the cecum this likely physiologic. Otherwise negative. Several hypodense lesions in the dome of the liver are not hypermetabolic and likely benign. SKELETON Mildly heterogeneous activity in the thoracic vertebra but no compelling findings of metastatic disease. IMPRESSION: 1. 6 cm right hilar mass with collapse of the right middle lobe and probable extension of mass into the right upper lobe and right lower lobe along the hilum, maximum standard uptake value 22.9, compatible with malignancy. 2. Pathologic and hypermetabolic right paratracheal and subcarinal adenopathy. 3. Indeterminate 1.3 by 0.8 cm right upper lobe nodule, maximum standard uptake value is 2.9 and I feel that this could be inflammatory rather than necessarily being malignant. There is some other ground-glass and oblong nodules in the right upper lobe which are probably inflammatory. 4. Hypermetabolic activity in both tonsils is fairly  symmetric and probably physiologic, but may merit attention on followup studies. Electronically Signed   By: Van Clines M.D.   On: 08/07/2015 10:49    ASSESSMENT AND PLAN: This is a very pleasant 54 years old African-American female recently diagnosed with a stage IIIa non-small cell lung cancer, squamous cell carcinoma with large right hilar mass in addition to mediastinal lymphadenopathy diagnosed in November 2016. The recent MRI of the brain showed no evidence for metastatic disease to the brain. She began weekly carboplatin for AUC of 2 and paclitaxel 45 MG/M2 on 08/19/2015, Cycle 1.  For the low back pain, She will continue to take Percocet 5/325 mg by mouth every 6 hours as needed. For her nausea, she will continue to take Compazine 10 mg by mouth every 6 hours as needed for nausea. In the interim, she will continue to receive daily treatments of radiation and that the daily resection of Dr. Tammi Klippel The patient requested that revealed for Tussionex in generic form due to cost. She was given a prescription for 140 mL bottle, that is 5 mL every 12 hours when necessary for cough The patient would come back for follow-up visit in 2 weeks With labs prior to her treatment She was advised to call immediately if she has any concerning symptoms in the interval. The patient voices understanding of current disease status and treatment options and is in agreement with the current care  plan.  All questions were answered. The patient knows to call the clinic with any problems, questions or concerns. We can certainly see the patient much sooner if necessary.  ADDENDUM: Hematology/Oncology Attending: I had a face to face encounter with the patient. I recommended her care plan. This is a very pleasant 54 years old African-American female recently diagnosed with a stage IIIa non-small cell lung cancer, squamous cell carcinoma and currently undergoing a course of concurrent chemoradiation with weekly  carboplatin and paclitaxel status post 1 week of treatment. She tolerated the first week of her treatment fairly well with no significant adverse effects. She denied having any significant fever or chills. She has mild nausea and she is currently on Compazine. The patient denied having any dysphagia or odynophagia. She notes improvement in her shortness of breath and chest pain. I recommended for the patient to continue her current treatment as scheduled. The patient would come back for follow-up visit in 2 weeks for reevaluation and management any adverse effect of her treatment. She was advised to call immediately if she has any concerning symptoms in the interval.  Disclaimer: This note was dictated with voice recognition software. Similar sounding words can inadvertently be transcribed and may be missed upon review. Eilleen Kempf., MD 08/27/2015

## 2015-08-27 NOTE — Telephone Encounter (Signed)
NOT ABLE TO REACH PATIENT BY PHONE OR LM. ADDED APPOINTMENTS FOR 12/19 THRU 1/9 WKLY. NO AVAILBILITY WITH MM 12/19 -  PATIENT SCHEDULED WITH SW AND WITH MM 1/9 WITH TX. PATIENT HAS EXISTING APPOINTMENT ON SCHEDULE FOR 12/12 AND WILL GET NEW SCHEDULE THEN.

## 2015-08-28 ENCOUNTER — Ambulatory Visit
Admission: RE | Admit: 2015-08-28 | Discharge: 2015-08-28 | Disposition: A | Discharge status: Home / Self Care | Payer: Medicare Other | Source: Ambulatory Visit | Attending: Radiation Oncology | Admitting: Radiation Oncology

## 2015-08-28 DIAGNOSIS — Z510 Encounter for antineoplastic radiation therapy: Secondary | ICD-10-CM | POA: Diagnosis not present

## 2015-08-28 DIAGNOSIS — C3491 Malignant neoplasm of unspecified part of right bronchus or lung: Secondary | ICD-10-CM | POA: Diagnosis not present

## 2015-08-29 ENCOUNTER — Ambulatory Visit
Admission: RE | Admit: 2015-08-29 | Discharge: 2015-08-29 | Disposition: A | Discharge status: Home / Self Care | Payer: Medicare Other | Source: Ambulatory Visit | Attending: Radiation Oncology | Admitting: Radiation Oncology

## 2015-08-29 DIAGNOSIS — Z510 Encounter for antineoplastic radiation therapy: Secondary | ICD-10-CM | POA: Diagnosis not present

## 2015-08-29 DIAGNOSIS — C3491 Malignant neoplasm of unspecified part of right bronchus or lung: Secondary | ICD-10-CM | POA: Diagnosis not present

## 2015-08-30 ENCOUNTER — Ambulatory Visit
Admission: RE | Admit: 2015-08-30 | Discharge: 2015-08-30 | Disposition: A | Discharge status: Home / Self Care | Payer: Medicare Other | Source: Ambulatory Visit | Attending: Radiation Oncology | Admitting: Radiation Oncology

## 2015-08-30 ENCOUNTER — Encounter: Payer: Self-pay | Admitting: Radiation Oncology

## 2015-08-30 VITALS — BP 116/71 | HR 112 | Resp 16 | Wt 223.2 lb

## 2015-08-30 DIAGNOSIS — Z510 Encounter for antineoplastic radiation therapy: Secondary | ICD-10-CM | POA: Diagnosis not present

## 2015-08-30 DIAGNOSIS — C342 Malignant neoplasm of middle lobe, bronchus or lung: Secondary | ICD-10-CM

## 2015-08-30 DIAGNOSIS — C3491 Malignant neoplasm of unspecified part of right bronchus or lung: Secondary | ICD-10-CM | POA: Diagnosis not present

## 2015-08-30 NOTE — Progress Notes (Signed)
Weight and vitals stable. Denies pain. Reports a frequent of dry cough. Denies throat pain or difficulty swallowing. Reports SOB with great exertion only. Reports difficulty sleeping. No skin changes noted within treatment field. Reports using radiaplex as directed.   BP 116/71 mmHg  Pulse 112  Resp 16  Wt 223 lb 3.2 oz (101.243 kg)  SpO2 100% Wt Readings from Last 3 Encounters:  08/30/15 223 lb 3.2 oz (101.243 kg)  08/27/15 221 lb 6.4 oz (100.426 kg)  08/22/15 219 lb (99.338 kg)

## 2015-08-30 NOTE — Progress Notes (Signed)
  Radiation Oncology         [phone]   Name: Theresa [name] MRN: [id number]   Date: 08/30/2015  DOB: 07/23/1961     Weekly Radiation Therapy Management    ICD-9-CM ICD-10-CM   1. Non-small cell carcinoma of lung, stage 3 (HCC) 162.4 C34.2     Current Dose: 20 Gy  Planned Dose:  66 Gy  Narrative The patient presents for routine under treatment assessment.  Weight and vitals stable. Denies pain. Reports a frequent of dry cough. Denies throat pain or difficulty swallowing. Reports SOB with great exertion only. Reports difficulty sleeping. No skin changes noted within treatment field. Reports using radiaplex as directed.   The patient is without complaint. Set-up films were reviewed. The chart was checked.  Physical Findings  weight is 223 lb 3.2 oz (101.243 kg). Her blood pressure is 116/71 and her pulse is 112. Her respiration is 16 and oxygen saturation is 100%. . Weight essentially stable.  No significant changes.  Impression The patient is tolerating radiation.  Plan Continue treatment as planned.          Sheral Apley Tammi Klippel, M.D.  This document serves as a record of services personally performed by Tyler Pita, MD. It was created on his behalf by Arlyce Harman, a trained medical scribe. The creation of this record is based on the scribe's personal observations and the provider's statements to them. This document has been checked and approved by the attending provider.

## 2015-09-02 ENCOUNTER — Ambulatory Visit (HOSPITAL_BASED_OUTPATIENT_CLINIC_OR_DEPARTMENT_OTHER): Payer: Medicare Other

## 2015-09-02 ENCOUNTER — Ambulatory Visit
Admission: RE | Admit: 2015-09-02 | Discharge: 2015-09-02 | Disposition: A | Discharge status: Home / Self Care | Payer: Medicare Other | Source: Ambulatory Visit | Attending: Radiation Oncology | Admitting: Radiation Oncology

## 2015-09-02 ENCOUNTER — Other Ambulatory Visit (HOSPITAL_BASED_OUTPATIENT_CLINIC_OR_DEPARTMENT_OTHER): Payer: Medicare Other

## 2015-09-02 VITALS — BP 126/85 | HR 98 | Temp 98.7°F | Resp 17

## 2015-09-02 DIAGNOSIS — C3401 Malignant neoplasm of right main bronchus: Secondary | ICD-10-CM | POA: Diagnosis present

## 2015-09-02 DIAGNOSIS — Z5111 Encounter for antineoplastic chemotherapy: Secondary | ICD-10-CM | POA: Diagnosis present

## 2015-09-02 DIAGNOSIS — Z510 Encounter for antineoplastic radiation therapy: Secondary | ICD-10-CM | POA: Diagnosis not present

## 2015-09-02 DIAGNOSIS — C3491 Malignant neoplasm of unspecified part of right bronchus or lung: Secondary | ICD-10-CM

## 2015-09-02 LAB — CBC WITH DIFFERENTIAL/PLATELET
BASO%: 0.4 % (ref 0.0–2.0)
Basophils Absolute: 0 10*3/uL (ref 0.0–0.1)
EOS ABS: 0 10*3/uL (ref 0.0–0.5)
EOS%: 0.8 % (ref 0.0–7.0)
HEMATOCRIT: 32.1 % — AB (ref 34.8–46.6)
HGB: 10.8 g/dL — ABNORMAL LOW (ref 11.6–15.9)
LYMPH#: 0.4 10*3/uL — AB (ref 0.9–3.3)
LYMPH%: 13.3 % — AB (ref 14.0–49.7)
MCH: 29 pg (ref 25.1–34.0)
MCHC: 33.6 g/dL (ref 31.5–36.0)
MCV: 86.1 fL (ref 79.5–101.0)
MONO#: 0.1 10*3/uL (ref 0.1–0.9)
MONO%: 4.6 % (ref 0.0–14.0)
NEUT%: 80.9 % — AB (ref 38.4–76.8)
NEUTROS ABS: 2.1 10*3/uL (ref 1.5–6.5)
PLATELETS: 242 10*3/uL (ref 145–400)
RBC: 3.73 10*6/uL (ref 3.70–5.45)
RDW: 16.3 % — ABNORMAL HIGH (ref 11.2–14.5)
WBC: 2.6 10*3/uL — AB (ref 3.9–10.3)

## 2015-09-02 LAB — COMPREHENSIVE METABOLIC PANEL
ALK PHOS: 91 U/L (ref 40–150)
ALT: 15 U/L (ref 0–55)
ANION GAP: 10 meq/L (ref 3–11)
AST: 21 U/L (ref 5–34)
Albumin: 3.4 g/dL — ABNORMAL LOW (ref 3.5–5.0)
BILIRUBIN TOTAL: 0.48 mg/dL (ref 0.20–1.20)
BUN: 6.8 mg/dL — ABNORMAL LOW (ref 7.0–26.0)
CALCIUM: 9.6 mg/dL (ref 8.4–10.4)
CO2: 21 meq/L — AB (ref 22–29)
CREATININE: 0.9 mg/dL (ref 0.6–1.1)
Chloride: 105 mEq/L (ref 98–109)
EGFR: 70 mL/min/{1.73_m2} — AB (ref >90)
Glucose: 142 mg/dl — ABNORMAL HIGH (ref 70–140)
Potassium: 3.5 mEq/L (ref 3.5–5.1)
Sodium: 137 mEq/L (ref 136–145)
TOTAL PROTEIN: 7.7 g/dL (ref 6.4–8.3)

## 2015-09-02 MED ORDER — SODIUM CHLORIDE 0.9 % IV SOLN
Freq: Once | INTRAVENOUS | Status: AC
  Administered 2015-09-02: 13:00:00 via INTRAVENOUS

## 2015-09-02 MED ORDER — FAMOTIDINE IN NACL 20-0.9 MG/50ML-% IV SOLN
INTRAVENOUS | Status: AC
  Filled 2015-09-02: qty 50

## 2015-09-02 MED ORDER — FAMOTIDINE IN NACL 20-0.9 MG/50ML-% IV SOLN
20.0000 mg | Freq: Once | INTRAVENOUS | Status: AC
  Administered 2015-09-02: 20 mg via INTRAVENOUS

## 2015-09-02 MED ORDER — PACLITAXEL CHEMO INJECTION 300 MG/50ML
45.0000 mg/m2 | Freq: Once | INTRAVENOUS | Status: AC
  Administered 2015-09-02: 96 mg via INTRAVENOUS
  Filled 2015-09-02: qty 16

## 2015-09-02 MED ORDER — SODIUM CHLORIDE 0.9 % IV SOLN
276.0000 mg | Freq: Once | INTRAVENOUS | Status: AC
  Administered 2015-09-02: 280 mg via INTRAVENOUS
  Filled 2015-09-02: qty 28

## 2015-09-02 MED ORDER — SODIUM CHLORIDE 0.9 % IV SOLN
Freq: Once | INTRAVENOUS | Status: AC
  Administered 2015-09-02: 13:00:00 via INTRAVENOUS
  Filled 2015-09-02: qty 8

## 2015-09-02 MED ORDER — DIPHENHYDRAMINE HCL 50 MG/ML IJ SOLN
INTRAMUSCULAR | Status: AC
  Filled 2015-09-02: qty 1

## 2015-09-02 MED ORDER — DIPHENHYDRAMINE HCL 50 MG/ML IJ SOLN
50.0000 mg | Freq: Once | INTRAMUSCULAR | Status: AC
  Administered 2015-09-02: 50 mg via INTRAVENOUS

## 2015-09-02 NOTE — Patient Instructions (Signed)
[facility] Discharge Instructions for Patients Receiving Chemotherapy  Today you received the following chemotherapy agents Taxol and Carboplatin. To help prevent nausea and vomiting after your treatment, we encourage you to take your nausea medication as directed.  If you develop nausea and vomiting that is not controlled by your nausea medication, call the clinic.   BELOW ARE SYMPTOMS THAT SHOULD BE REPORTED IMMEDIATELY:  *FEVER GREATER THAN 100.5 F  *CHILLS WITH OR WITHOUT FEVER  NAUSEA AND VOMITING THAT IS NOT CONTROLLED WITH YOUR NAUSEA MEDICATION  *UNUSUAL SHORTNESS OF BREATH  *UNUSUAL BRUISING OR BLEEDING  TENDERNESS IN MOUTH AND THROAT WITH OR WITHOUT PRESENCE OF ULCERS  *URINARY PROBLEMS  *BOWEL PROBLEMS  UNUSUAL RASH Items with * indicate a potential emergency and should be followed up as soon as possible.  Feel free to call the clinic you have any questions or concerns. The clinic phone number is [phone].  Please show the CHEMO ALERT CARD at check-in to the Emergency Department and triage nurse.    

## 2015-09-03 ENCOUNTER — Telehealth: Payer: Self-pay | Admitting: *Deleted

## 2015-09-03 ENCOUNTER — Ambulatory Visit
Admission: RE | Admit: 2015-09-03 | Discharge: 2015-09-03 | Disposition: A | Discharge status: Home / Self Care | Payer: Medicare Other | Source: Ambulatory Visit | Attending: Radiation Oncology | Admitting: Radiation Oncology

## 2015-09-03 DIAGNOSIS — Z510 Encounter for antineoplastic radiation therapy: Secondary | ICD-10-CM | POA: Diagnosis not present

## 2015-09-03 DIAGNOSIS — C3491 Malignant neoplasm of unspecified part of right bronchus or lung: Secondary | ICD-10-CM | POA: Diagnosis not present

## 2015-09-03 NOTE — Telephone Encounter (Signed)
Oncology Nurse Navigator Documentation  Oncology Nurse Navigator Flowsheets 09/03/2015  Navigator Encounter Type Telephone/called to follow up with patient.  I left vm message to call if needed with my name and phone number   Patient Visit Type Follow-up  Treatment Phase Treatment  Interventions Other  Coordination of Care -  Time Spent with Patient 15

## 2015-09-04 ENCOUNTER — Ambulatory Visit
Admission: RE | Admit: 2015-09-04 | Discharge: 2015-09-04 | Disposition: A | Discharge status: Home / Self Care | Payer: Medicare Other | Source: Ambulatory Visit | Attending: Radiation Oncology | Admitting: Radiation Oncology

## 2015-09-04 DIAGNOSIS — C3491 Malignant neoplasm of unspecified part of right bronchus or lung: Secondary | ICD-10-CM | POA: Diagnosis not present

## 2015-09-04 DIAGNOSIS — Z510 Encounter for antineoplastic radiation therapy: Secondary | ICD-10-CM | POA: Diagnosis not present

## 2015-09-05 ENCOUNTER — Telehealth: Payer: Self-pay | Admitting: *Deleted

## 2015-09-05 ENCOUNTER — Ambulatory Visit
Admission: RE | Admit: 2015-09-05 | Discharge: 2015-09-05 | Disposition: A | Discharge status: Home / Self Care | Payer: Medicare Other | Source: Ambulatory Visit | Attending: Radiation Oncology | Admitting: Radiation Oncology

## 2015-09-05 DIAGNOSIS — C3491 Malignant neoplasm of unspecified part of right bronchus or lung: Secondary | ICD-10-CM | POA: Diagnosis not present

## 2015-09-05 DIAGNOSIS — Z510 Encounter for antineoplastic radiation therapy: Secondary | ICD-10-CM | POA: Diagnosis not present

## 2015-09-05 NOTE — Telephone Encounter (Signed)
-----   Message from Ardeen Garland, RN sent at 08/21/2015  9:11 AM EST ----- Regarding: FW: MM First time chemo   ----- Message -----    From: Arty Baumgartner, RN    Sent: 08/19/2015   2:18 PM      To: Onc Triage Nurse [facility] Subject: MM First time chemo                            First time taxol/carbo.  Mohamed.  [phone]

## 2015-09-05 NOTE — Telephone Encounter (Signed)
Contacted pt, no concerns at this time.

## 2015-09-06 ENCOUNTER — Ambulatory Visit
Admission: RE | Admit: 2015-09-06 | Discharge: 2015-09-06 | Disposition: A | Discharge status: Home / Self Care | Payer: Medicare Other | Source: Ambulatory Visit | Attending: Radiation Oncology | Admitting: Radiation Oncology

## 2015-09-06 ENCOUNTER — Encounter: Payer: Self-pay | Admitting: Radiation Oncology

## 2015-09-06 VITALS — BP 115/64 | HR 107 | Resp 16 | Wt 218.3 lb

## 2015-09-06 DIAGNOSIS — Z510 Encounter for antineoplastic radiation therapy: Secondary | ICD-10-CM | POA: Diagnosis not present

## 2015-09-06 DIAGNOSIS — C3491 Malignant neoplasm of unspecified part of right bronchus or lung: Secondary | ICD-10-CM | POA: Diagnosis not present

## 2015-09-06 DIAGNOSIS — C342 Malignant neoplasm of middle lobe, bronchus or lung: Secondary | ICD-10-CM

## 2015-09-06 NOTE — Progress Notes (Signed)
  Radiation Oncology         [phone]   Name: Theresa [name] MRN: [id number]   Date: 09/06/2015  DOB: 1960-11-24     Weekly Radiation Therapy Management    ICD-9-CM ICD-10-CM   1. Non-small cell carcinoma of lung, stage 3 (HCC) 162.4 C34.2     Current Dose: 30 Gy  Planned Dose:  66 Gy  Narrative The patient presents for routine under treatment assessment.  Weight and vitals stable. Denies pain. Denies pain or difficulty associated with swallowing. Denies skin changes within treatment field. Reports using radiaplex twice daily as directed. Denies cough or sob. Denies fatigue.  The patient is without complaint. Set-up films were reviewed. The chart was checked.  Physical Findings  weight is 218 lb 4.8 oz (99.02 kg). Her blood pressure is 115/64 and her pulse is 107. Her respiration is 16 and oxygen saturation is 100%. . Weight essentially stable.  No significant changes.  Impression The patient is tolerating radiation.  Plan Continue treatment as planned.          Sheral Apley Tammi Klippel, M.D.  This document serves as a record of services personally performed by Tyler Pita, MD. It was created on his behalf by Lendon Collar, a trained medical scribe. The creation of this record is based on the scribe's personal observations and the provider's statements to them. This document has been checked and approved by the attending provider.

## 2015-09-06 NOTE — Progress Notes (Signed)
Weight and vitals stable. Denies pain. Denies pain or difficulty associated with swallowing. Denies skin changes within treatment field. Reports using radiaplex bid as directed. Denies cough or sob. Denies fatigue.   BP 115/64 mmHg  Pulse 107  Resp 16  Wt 218 lb 4.8 oz (99.02 kg)  SpO2 100% Wt Readings from Last 3 Encounters:  09/06/15 218 lb 4.8 oz (99.02 kg)  08/30/15 223 lb 3.2 oz (101.243 kg)  08/27/15 221 lb 6.4 oz (100.426 kg)

## 2015-09-09 ENCOUNTER — Ambulatory Visit (HOSPITAL_BASED_OUTPATIENT_CLINIC_OR_DEPARTMENT_OTHER): Payer: Medicare Other

## 2015-09-09 ENCOUNTER — Other Ambulatory Visit (HOSPITAL_BASED_OUTPATIENT_CLINIC_OR_DEPARTMENT_OTHER): Payer: Medicare Other

## 2015-09-09 ENCOUNTER — Other Ambulatory Visit: Payer: Self-pay | Admitting: Physician Assistant

## 2015-09-09 ENCOUNTER — Ambulatory Visit (HOSPITAL_BASED_OUTPATIENT_CLINIC_OR_DEPARTMENT_OTHER): Payer: Medicare Other | Admitting: Physician Assistant

## 2015-09-09 ENCOUNTER — Ambulatory Visit
Admission: RE | Admit: 2015-09-09 | Discharge: 2015-09-09 | Disposition: A | Discharge status: Home / Self Care | Payer: Medicare Other | Source: Ambulatory Visit | Attending: Radiation Oncology | Admitting: Radiation Oncology

## 2015-09-09 VITALS — HR 91

## 2015-09-09 VITALS — BP 123/72 | HR 111 | Temp 97.4°F | Resp 18 | Ht 68.0 in | Wt 219.4 lb

## 2015-09-09 DIAGNOSIS — M549 Dorsalgia, unspecified: Secondary | ICD-10-CM

## 2015-09-09 DIAGNOSIS — C3491 Malignant neoplasm of unspecified part of right bronchus or lung: Secondary | ICD-10-CM

## 2015-09-09 DIAGNOSIS — C3401 Malignant neoplasm of right main bronchus: Secondary | ICD-10-CM | POA: Diagnosis not present

## 2015-09-09 DIAGNOSIS — Z5111 Encounter for antineoplastic chemotherapy: Secondary | ICD-10-CM

## 2015-09-09 DIAGNOSIS — C342 Malignant neoplasm of middle lobe, bronchus or lung: Secondary | ICD-10-CM

## 2015-09-09 DIAGNOSIS — Z510 Encounter for antineoplastic radiation therapy: Secondary | ICD-10-CM | POA: Diagnosis not present

## 2015-09-09 LAB — CBC WITH DIFFERENTIAL/PLATELET
BASO%: 1 % (ref 0.0–2.0)
BASOS ABS: 0 10*3/uL (ref 0.0–0.1)
EOS ABS: 0 10*3/uL (ref 0.0–0.5)
EOS%: 0.5 % (ref 0.0–7.0)
HCT: 31.1 % — ABNORMAL LOW (ref 34.8–46.6)
HGB: 10.5 g/dL — ABNORMAL LOW (ref 11.6–15.9)
LYMPH%: 15.8 % (ref 14.0–49.7)
MCH: 29.2 pg (ref 25.1–34.0)
MCHC: 33.8 g/dL (ref 31.5–36.0)
MCV: 86.4 fL (ref 79.5–101.0)
MONO#: 0.1 10*3/uL (ref 0.1–0.9)
MONO%: 5.4 % (ref 0.0–14.0)
NEUT#: 1.6 10*3/uL (ref 1.5–6.5)
NEUT%: 77.3 % — AB (ref 38.4–76.8)
Platelets: 218 10*3/uL (ref 145–400)
RBC: 3.6 10*6/uL — ABNORMAL LOW (ref 3.70–5.45)
RDW: 16.7 % — ABNORMAL HIGH (ref 11.2–14.5)
WBC: 2 10*3/uL — ABNORMAL LOW (ref 3.9–10.3)
lymph#: 0.3 10*3/uL — ABNORMAL LOW (ref 0.9–3.3)

## 2015-09-09 LAB — COMPREHENSIVE METABOLIC PANEL
ALBUMIN: 3.5 g/dL (ref 3.5–5.0)
ALK PHOS: 89 U/L (ref 40–150)
ALT: 15 U/L (ref 0–55)
AST: 17 U/L (ref 5–34)
Anion Gap: 10 mEq/L (ref 3–11)
BUN: 8.9 mg/dL (ref 7.0–26.0)
CALCIUM: 9.6 mg/dL (ref 8.4–10.4)
CHLORIDE: 108 meq/L (ref 98–109)
CO2: 22 mEq/L (ref 22–29)
Creatinine: 0.9 mg/dL (ref 0.6–1.1)
EGFR: 78 mL/min/{1.73_m2} — AB (ref >90)
GLUCOSE: 124 mg/dL (ref 70–140)
POTASSIUM: 3.5 meq/L (ref 3.5–5.1)
SODIUM: 140 meq/L (ref 136–145)
Total Bilirubin: 0.59 mg/dL (ref 0.20–1.20)
Total Protein: 7.5 g/dL (ref 6.4–8.3)

## 2015-09-09 MED ORDER — PACLITAXEL CHEMO INJECTION 300 MG/50ML
45.0000 mg/m2 | Freq: Once | INTRAVENOUS | Status: AC
  Administered 2015-09-09: 96 mg via INTRAVENOUS
  Filled 2015-09-09: qty 16

## 2015-09-09 MED ORDER — FAMOTIDINE IN NACL 20-0.9 MG/50ML-% IV SOLN
INTRAVENOUS | Status: AC
  Filled 2015-09-09: qty 50

## 2015-09-09 MED ORDER — MAGIC MOUTHWASH W/LIDOCAINE: 5.0000 mL | Freq: Three times a day (TID) | ORAL | Status: DC | PRN

## 2015-09-09 MED ORDER — SODIUM CHLORIDE 0.9 % IV SOLN
Freq: Once | INTRAVENOUS | Status: AC
  Administered 2015-09-09: 11:00:00 via INTRAVENOUS
  Filled 2015-09-09: qty 8

## 2015-09-09 MED ORDER — SODIUM CHLORIDE 0.9 % IV SOLN
Freq: Once | INTRAVENOUS | Status: AC
  Administered 2015-09-09: 11:00:00 via INTRAVENOUS

## 2015-09-09 MED ORDER — DIPHENHYDRAMINE HCL 50 MG/ML IJ SOLN
50.0000 mg | Freq: Once | INTRAMUSCULAR | Status: AC
  Administered 2015-09-09: 50 mg via INTRAVENOUS

## 2015-09-09 MED ORDER — DIPHENHYDRAMINE HCL 50 MG/ML IJ SOLN
INTRAMUSCULAR | Status: AC
  Filled 2015-09-09: qty 1

## 2015-09-09 MED ORDER — FAMOTIDINE IN NACL 20-0.9 MG/50ML-% IV SOLN
20.0000 mg | Freq: Once | INTRAVENOUS | Status: AC
  Administered 2015-09-09: 20 mg via INTRAVENOUS

## 2015-09-09 MED ORDER — SODIUM CHLORIDE 0.9 % IV SOLN
276.0000 mg | Freq: Once | INTRAVENOUS | Status: AC
  Administered 2015-09-09: 280 mg via INTRAVENOUS
  Filled 2015-09-09: qty 28

## 2015-09-09 MED ORDER — OXYCODONE-ACETAMINOPHEN 5-325 MG PO TABS: 1.0000 | ORAL_TABLET | Freq: Four times a day (QID) | ORAL | Status: DC | PRN

## 2015-09-09 NOTE — Progress Notes (Signed)
[facility] [phone]) [phone]   [phone] Multidisciplinary thoracic oncology clinic  OFFICE PROGRESS NOTE  No PCP Per Patient No address on file  DIAGNOSIS: Stage IIIa (T2b, N2, M0) non-small cell lung cancer, invasive poorly differentiated squamous cell carcinoma diagnosed in November 2016 and presented with large right hilar mass with collapse of the right middle lobe and extension into the right upper lobe and right lower lobe along the hilum with right paratracheal and subcarinal lymphadenopathy. PDL 1 testing is still pending.  PRIOR THERAPY: None  CURRENT THERAPY: Concurrent chemoradiation with weekly carboplatin for AUC of 2 and paclitaxel 45 MG/M2. First dose 08/19/2015 S/p cycle 3 on 09/02/15  INTERVAL HISTORY: Theresa [name] 54 y.o. female returns to the clinic toda following her day 1 cycle 4 of chemotherapy with carbo/taxol.The patient is feeling fine today with no specific complaints. She tolerated her chemotherapy well without any significant side effects. She reports that now she can swallow without difficulty. She continues to have mild cough and shortness of breath with exertion. She denied having any significant hemoptysis. The patient denied having any significant weight loss or night sweats. She has no nausea or vomiting. She has mild odynophagia . Her appetite is stable. Her back pain is well-controlled with medications. She is tolerating her radiation without complaints.   MEDICAL HISTORY: Past Medical History  Diagnosis Date  . Pneumonia   . Cancer ([place])     5 years ago - cervical cancer  . Non-small cell carcinoma of lung, stage 3 ([place]) 08/08/2015  . Encounter for antineoplastic chemotherapy 08/27/2015    ALLERGIES:  has No Known Allergies.  MEDICATIONS:  Current Outpatient Prescriptions  Medication Sig Dispense Refill  . AFLURIA PRESERVATIVE FREE 0.5 ML SUSY inject 0.5 milliliter intramuscularly  0  . Calcium  Carb-Cholecalciferol (CALCIUM 600 + D) 600-200 MG-UNIT TABS Take 2 capsules by mouth daily.    . chlorpheniramine-HYDROcodone (TUSSIONEX) 10-8 MG/5ML SUER Take 5 mLs by mouth every 12 (twelve) hours as needed for cough. 140 mL 0  . docusate sodium (COLACE) 100 MG capsule Take by mouth.    Marland Kitchen ibuprofen (ADVIL,MOTRIN) 800 MG tablet Take by mouth.    . magic mouthwash w/lidocaine SOLN Take 5 mLs by mouth 3 (three) times daily as needed for mouth pain. 240 mL 1  . Multiple Vitamin (MULTIVITAMIN WITH MINERALS) TABS tablet Take 1 tablet by mouth daily.    . Naproxen Sodium (ALEVE) 220 MG CAPS Take by mouth.    . naproxen sodium (ANAPROX) 220 MG tablet Take 440 mg by mouth 2 (two) times daily as needed (pain).    Marland Kitchen oxyCODONE-acetaminophen (PERCOCET/ROXICET) 5-325 MG tablet Take 1 tablet by mouth every 6 (six) hours as needed for severe pain. 40 tablet 0  . prochlorperazine (COMPAZINE) 10 MG tablet Take 1 tablet (10 mg total) by mouth every 6 (six) hours as needed for nausea or vomiting. 30 tablet 0  . Wound Cleansers (RADIAPLEX EX) Apply topically.     No current facility-administered medications for this visit.    SURGICAL HISTORY:  Past Surgical History  Procedure Laterality Date  . Wisdom tooth extraction    . Tubal ligation    . Video bronchoscopy with endobronchial ultrasound N/A 08/02/2015    Procedure: VIDEO BRONCHOSCOPY WITH ENDOBRONCHIAL ULTRASOUND;  Surgeon: [name], MD;  Location: [facility];  Service: Thoracic;  Laterality: N/A;    REVIEW OF SYSTEMS:  Constitutional: negative Eyes: negative Ears, nose, mouth, throat, and  face: negative Respiratory: positive for cough and dyspnea on exertion Cardiovascular: negative Gastrointestinal: negative except for mild odynophagia without oral lesions or thrush Genitourinary:negative Integument/breast: negative Hematologic/lymphatic: negative Musculoskeletal:positive for back pain Neurological: negative Behavioral/Psych:  negative Endocrine: negative Allergic/Immunologic: negative   PHYSICAL EXAMINATION: General appearance: alert, cooperative and no distress Head: Normocephalic, without obvious abnormality, atraumatic Neck: no adenopathy, no JVD, supple, symmetrical, trachea midline and thyroid not enlarged, symmetric, no tenderness/mass/nodules Lymph nodes: Cervical, supraclavicular, and axillary nodes normal. Resp: clear to auscultation bilaterally Back: symmetric, no curvature. ROM normal. No CVA tenderness. Cardio: regular rate and rhythm, S1, S2 normal, no murmur, click, rub or gallop GI: soft, non-tender; bowel sounds normal; no masses,  no organomegaly Extremities: extremities normal, atraumatic, no cyanosis or edema Neurologic: Alert and oriented X 3, normal strength and tone. Normal symmetric reflexes. Normal coordination and gait  ECOG PERFORMANCE STATUS: 1 - Symptomatic but completely ambulatory  Blood pressure 123/72, pulse 111, temperature 97.4 F (36.3 C), temperature source Oral, resp. rate 18, height '5\' 8"'$  (1.727 m), weight 219 lb 6.4 oz (99.519 kg), SpO2 100 %.  LABORATORY DATA: CBC Latest Ref Rng 09/09/2015 09/02/2015 08/26/2015  WBC 3.9 - 10.3 10e3/uL 2.0(L) 2.6(L) 2.6(L)  Hemoglobin 11.6 - 15.9 g/dL 10.5(L) 10.8(L) 10.8(L)  Hematocrit 34.8 - 46.6 % 31.1(L) 32.1(L) 32.4(L)  Platelets 145 - 400 10e3/uL 218 242 262   CMP Latest Ref Rng 09/09/2015 09/02/2015 08/26/2015  Glucose 70 - 140 mg/dl 124 142(H) 99  BUN 7.0 - 26.0 mg/dL 8.9 6.8(L) 10.1  Creatinine 0.6 - 1.1 mg/dL 0.9 0.9 0.8  Sodium 136 - 145 mEq/L 140 137 138  Potassium 3.5 - 5.1 mEq/L 3.5 3.5 3.6  Chloride 101 - 111 mmol/L - - -  CO2 22 - 29 mEq/L 22 21(L) 23  Calcium 8.4 - 10.4 mg/dL 9.6 9.6 9.7  Total Protein 6.4 - 8.3 g/dL 7.5 7.7 8.1  Total Bilirubin 0.20 - 1.20 mg/dL 0.59 0.48 0.49  Alkaline Phos 40 - 150 U/L 89 91 90  AST 5 - 34 U/L 17 21 35(H)  ALT 0 - 55 U/L '15 15 17   '$ ANC is 1.9  RADIOGRAPHIC STUDIES: No  results found.  ASSESSMENT AND PLAN: This is a very pleasant 54 years old African-American female recently diagnosed with a stage IIIa non-small cell lung cancer, squamous cell carcinoma with large right hilar mass in addition to mediastinal lymphadenopathy diagnosed in November 2016. The recent MRI of the brain showed no evidence for metastatic disease to the brain. She began weekly carboplatin for AUC of 2 and paclitaxel 45 MG/M2 on 08/19/2015, Cycle 1. She will receive cycle 4 today Will monitor her white count closely as it is in the borderline abnormal with AnC of 1.6 For the low back pain, She will continue to take Percocet 5/325 mg by mouth every 6 hours as needed. A new refill was given today A prescription for magic mouthwash was given today  for the treatment for odynophagia For her nausea, she will continue to take Compazine 10 mg by mouth every 6 hours as needed for nausea. In the interim, she will continue to receive daily treatments of radiation and that the daily resection of Dr. Tammi Klippel The patient would come back for follow-up visit in 2 weeks With labs prior to her treatment She was advised to call immediately if she has any concerning symptoms in the interval. The patient voices understanding of current disease status and treatment options and is in agreement with the current  care plan.  All questions were answered. The patient knows to call the clinic with any problems, questions or concerns. We can certainly see the patient much sooner if necessary. Rondel Jumbo, PA-C 09/09/2015  ADDENDUM: Hematology/Oncology Attending: I had a face to face encounter with the patient today. I recommended her care plan. This is a very pleasant 54 years old African-American female with a stage IIIa non-small cell lung cancer, squamous cell carcinoma currently undergoing concurrent chemoradiation with weekly carboplatin and paclitaxel status post 3 cycles. The patient has been treating her  treatment fairly well with no significant improvement. I recommended for her to proceed with her treatment as scheduled. She would come back for follow-up visit in 2 weeks for reevaluation and management of any adverse effect of her treatment. The patient was advised to call immediately if she has any concerning symptoms in the interval.  Disclaimer: This note was dictated with voice recognition software. Similar sounding words can inadvertently be transcribed and may be missed upon review. Eilleen Kempf., MD 09/09/2015

## 2015-09-09 NOTE — Patient Instructions (Signed)
[facility] Discharge Instructions for Patients Receiving Chemotherapy  Today you received the following chemotherapy agents Taxol and Carboplatin. To help prevent nausea and vomiting after your treatment, we encourage you to take your nausea medication as directed.  If you develop nausea and vomiting that is not controlled by your nausea medication, call the clinic.   BELOW ARE SYMPTOMS THAT SHOULD BE REPORTED IMMEDIATELY:  *FEVER GREATER THAN 100.5 F  *CHILLS WITH OR WITHOUT FEVER  NAUSEA AND VOMITING THAT IS NOT CONTROLLED WITH YOUR NAUSEA MEDICATION  *UNUSUAL SHORTNESS OF BREATH  *UNUSUAL BRUISING OR BLEEDING  TENDERNESS IN MOUTH AND THROAT WITH OR WITHOUT PRESENCE OF ULCERS  *URINARY PROBLEMS  *BOWEL PROBLEMS  UNUSUAL RASH Items with * indicate a potential emergency and should be followed up as soon as possible.  Feel free to call the clinic you have any questions or concerns. The clinic phone number is [phone].  Please show the CHEMO ALERT CARD at check-in to the Emergency Department and triage nurse.    

## 2015-09-10 ENCOUNTER — Ambulatory Visit
Admission: RE | Admit: 2015-09-10 | Discharge: 2015-09-10 | Disposition: A | Discharge status: Home / Self Care | Payer: Medicare Other | Source: Ambulatory Visit | Attending: Radiation Oncology | Admitting: Radiation Oncology

## 2015-09-10 DIAGNOSIS — Z510 Encounter for antineoplastic radiation therapy: Secondary | ICD-10-CM | POA: Diagnosis not present

## 2015-09-10 DIAGNOSIS — C3491 Malignant neoplasm of unspecified part of right bronchus or lung: Secondary | ICD-10-CM | POA: Diagnosis not present

## 2015-09-11 ENCOUNTER — Ambulatory Visit
Admission: RE | Admit: 2015-09-11 | Discharge: 2015-09-11 | Disposition: A | Discharge status: Home / Self Care | Payer: Medicare Other | Source: Ambulatory Visit | Attending: Radiation Oncology | Admitting: Radiation Oncology

## 2015-09-11 DIAGNOSIS — Z510 Encounter for antineoplastic radiation therapy: Secondary | ICD-10-CM | POA: Diagnosis not present

## 2015-09-11 DIAGNOSIS — C3491 Malignant neoplasm of unspecified part of right bronchus or lung: Secondary | ICD-10-CM | POA: Diagnosis not present

## 2015-09-12 ENCOUNTER — Ambulatory Visit
Admission: RE | Admit: 2015-09-12 | Discharge: 2015-09-12 | Disposition: A | Discharge status: Home / Self Care | Payer: Medicare Other | Source: Ambulatory Visit | Attending: Radiation Oncology | Admitting: Radiation Oncology

## 2015-09-12 DIAGNOSIS — C3491 Malignant neoplasm of unspecified part of right bronchus or lung: Secondary | ICD-10-CM | POA: Diagnosis not present

## 2015-09-12 DIAGNOSIS — Z510 Encounter for antineoplastic radiation therapy: Secondary | ICD-10-CM | POA: Diagnosis not present

## 2015-09-13 ENCOUNTER — Ambulatory Visit
Admission: RE | Admit: 2015-09-13 | Discharge: 2015-09-13 | Disposition: A | Discharge status: Home / Self Care | Payer: Medicare Other | Source: Ambulatory Visit | Attending: Radiation Oncology | Admitting: Radiation Oncology

## 2015-09-13 VITALS — BP 115/88 | HR 103 | Resp 16 | Wt 220.1 lb

## 2015-09-13 DIAGNOSIS — C3491 Malignant neoplasm of unspecified part of right bronchus or lung: Secondary | ICD-10-CM | POA: Diagnosis not present

## 2015-09-13 DIAGNOSIS — C342 Malignant neoplasm of middle lobe, bronchus or lung: Secondary | ICD-10-CM

## 2015-09-13 MED ORDER — SUCRALFATE 1 G PO TABS: 1.0000 g | ORAL_TABLET | Freq: Four times a day (QID) | ORAL | Status: DC

## 2015-09-13 NOTE — Progress Notes (Signed)
Weight and vitals stable. Reports new onset pain and difficulty swallowing. Reports occasional dry cough. Denies skin changes within treatment field. Reports using radiaplex bid as directed. Denies SOB. Reports mild fatigue. Provided patient with handouts that cover food to avoid when experiencing a sore throat. Reviewed this material with the patient and her daughter. Both verbalized understanding.   BP 115/88 mmHg  Pulse 103  Resp 16  Wt 220 lb 1.6 oz (99.837 kg)  SpO2 100% Wt Readings from Last 3 Encounters:  09/13/15 220 lb 1.6 oz (99.837 kg)  09/09/15 219 lb 6.4 oz (99.519 kg)  09/06/15 218 lb 4.8 oz (99.02 kg)

## 2015-09-13 NOTE — Progress Notes (Signed)
Department of Radiation Oncology  Phone:  [phone] Fax:        [phone]  Weekly Treatment Note    Name: Theresa [name] Date: 09/13/2015 MRN: [id number] DOB: May 04, 1961   Diagnosis:     ICD-9-CM ICD-10-CM   1. Non-small cell carcinoma of lung, stage 3 (HCC) 162.4 C34.2      Current dose: 40 Gy  Current fraction:20   MEDICATIONS: Current Outpatient Prescriptions  Medication Sig Dispense Refill  . AFLURIA PRESERVATIVE FREE 0.5 ML SUSY inject 0.5 milliliter intramuscularly  0  . Calcium Carb-Cholecalciferol (CALCIUM 600 + D) 600-200 MG-UNIT TABS Take 2 capsules by mouth daily.    . chlorpheniramine-HYDROcodone (TUSSIONEX) 10-8 MG/5ML SUER Take 5 mLs by mouth every 12 (twelve) hours as needed for cough. 140 mL 0  . docusate sodium (COLACE) 100 MG capsule Take by mouth.    Marland Kitchen ibuprofen (ADVIL,MOTRIN) 800 MG tablet Take by mouth.    . magic mouthwash w/lidocaine SOLN Take 5 mLs by mouth 3 (three) times daily as needed for mouth pain. 240 mL 1  . Multiple Vitamin (MULTIVITAMIN WITH MINERALS) TABS tablet Take 1 tablet by mouth daily.    . Naproxen Sodium (ALEVE) 220 MG CAPS Take by mouth.    . naproxen sodium (ANAPROX) 220 MG tablet Take 440 mg by mouth 2 (two) times daily as needed (pain).    Marland Kitchen oxyCODONE-acetaminophen (PERCOCET/ROXICET) 5-325 MG tablet Take 1 tablet by mouth every 6 (six) hours as needed for severe pain. 40 tablet 0  . prochlorperazine (COMPAZINE) 10 MG tablet Take 1 tablet (10 mg total) by mouth every 6 (six) hours as needed for nausea or vomiting. 30 tablet 0  . sucralfate (CARAFATE) 1 G tablet Take 1 tablet (1 g total) by mouth 4 (four) times daily. 120 tablet 2  . Wound Cleansers (RADIAPLEX EX) Apply topically.     No current facility-administered medications for this encounter.     ALLERGIES: Review of patient's allergies indicates no known allergies.   LABORATORY DATA:  Lab Results  Component Value Date   WBC 2.0* 09/09/2015   HGB 10.5*  09/09/2015   HCT 31.1* 09/09/2015   MCV 86.4 09/09/2015   PLT 218 09/09/2015   Lab Results  Component Value Date   NA 140 09/09/2015   K 3.5 09/09/2015   CL 103 08/01/2015   CO2 22 09/09/2015   Lab Results  Component Value Date   ALT 15 09/09/2015   AST 17 09/09/2015   ALKPHOS 89 09/09/2015   BILITOT 0.59 09/09/2015     NARRATIVE: Theresa [name] was seen today for weekly treatment management. The chart was checked and the patient's films were reviewed.  Reports new onset pain and difficulty swallowing. Reports occasional dry cough. Denies skin changes within treatment field. Reports using radiaplex bid as directed. Denies SOB. Reports mild fatigue. The nurse provided the patient with handouts that cover food to avoid when experiencing a sore throat. She reviewed this material with the patient and her daughter. Both verbalized understanding.  PHYSICAL EXAMINATION: weight is 220 lb 1.6 oz (99.837 kg). Her blood pressure is 115/88 and her pulse is 103. Her respiration is 16 and oxygen saturation is 100%.   ASSESSMENT: The patient is doing satisfactorily with treatment.  PLAN: We will continue with the patient's radiation treatment as planned. I will prescribe Carafate and I have explained its use to the patient and her daughter.  This document serves as a record of services personally performed by Jenny Reichmann  Lisbeth Renshaw, MD. It was created on his behalf by Darcus Austin, a trained medical scribe. The creation of this record is based on the scribe's personal observations and the provider's statements to them. This document has been checked and approved by the attending provider.

## 2015-09-16 ENCOUNTER — Emergency Department (HOSPITAL_COMMUNITY): Payer: Medicare Other

## 2015-09-16 ENCOUNTER — Other Ambulatory Visit (HOSPITAL_COMMUNITY): Payer: Medicare Other

## 2015-09-16 ENCOUNTER — Encounter (HOSPITAL_COMMUNITY): Payer: Self-pay | Admitting: Emergency Medicine

## 2015-09-16 ENCOUNTER — Inpatient Hospital Stay (HOSPITAL_COMMUNITY)
Admission: EM | Admit: 2015-09-16 | Discharge: 2015-09-19 | DRG: 391 | Disposition: A | Discharge status: Home / Self Care | Payer: Medicare Other | Attending: Internal Medicine | Admitting: Internal Medicine

## 2015-09-16 DIAGNOSIS — R112 Nausea with vomiting, unspecified: Secondary | ICD-10-CM | POA: Diagnosis present

## 2015-09-16 DIAGNOSIS — K208 Other esophagitis: Secondary | ICD-10-CM | POA: Diagnosis not present

## 2015-09-16 DIAGNOSIS — Z87891 Personal history of nicotine dependence: Secondary | ICD-10-CM

## 2015-09-16 DIAGNOSIS — R000 Tachycardia, unspecified: Secondary | ICD-10-CM | POA: Diagnosis present

## 2015-09-16 DIAGNOSIS — E440 Moderate protein-calorie malnutrition: Secondary | ICD-10-CM | POA: Diagnosis present

## 2015-09-16 DIAGNOSIS — D61810 Antineoplastic chemotherapy induced pancytopenia: Secondary | ICD-10-CM | POA: Diagnosis not present

## 2015-09-16 DIAGNOSIS — K5900 Constipation, unspecified: Secondary | ICD-10-CM | POA: Diagnosis present

## 2015-09-16 DIAGNOSIS — D630 Anemia in neoplastic disease: Secondary | ICD-10-CM | POA: Diagnosis present

## 2015-09-16 DIAGNOSIS — T451X5A Adverse effect of antineoplastic and immunosuppressive drugs, initial encounter: Secondary | ICD-10-CM

## 2015-09-16 DIAGNOSIS — Z79899 Other long term (current) drug therapy: Secondary | ICD-10-CM

## 2015-09-16 DIAGNOSIS — I4892 Unspecified atrial flutter: Secondary | ICD-10-CM

## 2015-09-16 DIAGNOSIS — R079 Chest pain, unspecified: Secondary | ICD-10-CM | POA: Diagnosis not present

## 2015-09-16 DIAGNOSIS — C342 Malignant neoplasm of middle lobe, bronchus or lung: Secondary | ICD-10-CM | POA: Diagnosis present

## 2015-09-16 DIAGNOSIS — C3481 Malignant neoplasm of overlapping sites of right bronchus and lung: Secondary | ICD-10-CM | POA: Diagnosis not present

## 2015-09-16 DIAGNOSIS — Z8541 Personal history of malignant neoplasm of cervix uteri: Secondary | ICD-10-CM

## 2015-09-16 DIAGNOSIS — Z801 Family history of malignant neoplasm of trachea, bronchus and lung: Secondary | ICD-10-CM

## 2015-09-16 DIAGNOSIS — I483 Typical atrial flutter: Secondary | ICD-10-CM | POA: Diagnosis not present

## 2015-09-16 DIAGNOSIS — R740 Nonspecific elevation of levels of transaminase and lactic acid dehydrogenase [LDH]: Secondary | ICD-10-CM | POA: Diagnosis present

## 2015-09-16 DIAGNOSIS — R0789 Other chest pain: Secondary | ICD-10-CM | POA: Diagnosis not present

## 2015-09-16 HISTORY — DX: Unspecified atrial flutter: I48.92

## 2015-09-16 LAB — BASIC METABOLIC PANEL
Anion gap: 14 (ref 5–15)
BUN: 13 mg/dL (ref 6–20)
CHLORIDE: 101 mmol/L (ref 101–111)
CO2: 23 mmol/L (ref 22–32)
CREATININE: 0.92 mg/dL (ref 0.44–1.00)
Calcium: 10 mg/dL (ref 8.9–10.3)
GFR calc non Af Amer: >60 mL/min (ref >60)
Glucose, Bld: 144 mg/dL — ABNORMAL HIGH (ref 65–99)
POTASSIUM: 3.8 mmol/L (ref 3.5–5.1)
SODIUM: 138 mmol/L (ref 135–145)

## 2015-09-16 LAB — CBC
HCT: 34.2 % — ABNORMAL LOW (ref 36.0–46.0)
Hemoglobin: 11.5 g/dL — ABNORMAL LOW (ref 12.0–15.0)
MCH: 29.3 pg (ref 26.0–34.0)
MCHC: 33.6 g/dL (ref 30.0–36.0)
MCV: 87 fL (ref 78.0–100.0)
PLATELETS: 188 10*3/uL (ref 150–400)
RBC: 3.93 MIL/uL (ref 3.87–5.11)
RDW: 17.3 % — ABNORMAL HIGH (ref 11.5–15.5)
WBC: 2.1 10*3/uL — AB (ref 4.0–10.5)

## 2015-09-16 LAB — HEPATIC FUNCTION PANEL
ALT: 20 U/L (ref 14–54)
AST: 24 U/L (ref 15–41)
Albumin: 4.3 g/dL (ref 3.5–5.0)
Alkaline Phosphatase: 78 U/L (ref 38–126)
BILIRUBIN DIRECT: 0.2 mg/dL (ref 0.1–0.5)
BILIRUBIN INDIRECT: 0.9 mg/dL (ref 0.3–0.9)
Total Bilirubin: 1.1 mg/dL (ref 0.3–1.2)
Total Protein: 8.7 g/dL — ABNORMAL HIGH (ref 6.5–8.1)

## 2015-09-16 LAB — I-STAT TROPONIN, ED: Troponin i, poc: 0.01 ng/mL (ref 0.00–0.08)

## 2015-09-16 LAB — I-STAT CG4 LACTIC ACID, ED
LACTIC ACID, VENOUS: 2.49 mmol/L — AB (ref 0.5–2.0)
LACTIC ACID, VENOUS: 1.08 mmol/L (ref 0.5–2.0)

## 2015-09-16 LAB — TROPONIN I

## 2015-09-16 MED ORDER — SUCRALFATE 1 G PO TABS
1.0000 g | ORAL_TABLET | Freq: Four times a day (QID) | ORAL | Status: DC
  Administered 2015-09-16: 1 g via ORAL
  Filled 2015-09-16 (×4): qty 1

## 2015-09-16 MED ORDER — IOHEXOL 350 MG/ML SOLN
100.0000 mL | Freq: Once | INTRAVENOUS | Status: AC | PRN
  Administered 2015-09-16: 100 mL via INTRAVENOUS

## 2015-09-16 MED ORDER — MORPHINE SULFATE (PF) 4 MG/ML IV SOLN
4.0000 mg | Freq: Once | INTRAVENOUS | Status: AC
  Administered 2015-09-16: 4 mg via INTRAVENOUS
  Filled 2015-09-16: qty 1

## 2015-09-16 MED ORDER — SODIUM CHLORIDE 0.9 % IV SOLN
INTRAVENOUS | Status: DC
  Administered 2015-09-16: 16:00:00 via INTRAVENOUS

## 2015-09-16 MED ORDER — MORPHINE SULFATE (PF) 4 MG/ML IV SOLN
4.0000 mg | Freq: Once | INTRAVENOUS | Status: AC
Start: 2015-09-16 — End: 2015-09-16
  Administered 2015-09-16: 4 mg via INTRAVENOUS
  Filled 2015-09-16: qty 1

## 2015-09-16 MED ORDER — ENOXAPARIN SODIUM 40 MG/0.4ML ~~LOC~~ SOLN
40.0000 mg | Freq: Every day | SUBCUTANEOUS | Status: DC
  Administered 2015-09-16: 40 mg via SUBCUTANEOUS
  Filled 2015-09-16: qty 0.4

## 2015-09-16 MED ORDER — HYDROCOD POLST-CPM POLST ER 10-8 MG/5ML PO SUER: 5.0000 mL | Freq: Two times a day (BID) | ORAL | Status: DC | PRN

## 2015-09-16 MED ORDER — CALCIUM CARBONATE-VITAMIN D 500-200 MG-UNIT PO TABS
2.0000 | ORAL_TABLET | Freq: Every day | ORAL | Status: DC
  Filled 2015-09-16: qty 2

## 2015-09-16 MED ORDER — ACETAMINOPHEN 650 MG RE SUPP: 650.0000 mg | Freq: Four times a day (QID) | RECTAL | Status: DC | PRN

## 2015-09-16 MED ORDER — ACETAMINOPHEN 325 MG PO TABS: 650.0000 mg | ORAL_TABLET | Freq: Four times a day (QID) | ORAL | Status: DC | PRN

## 2015-09-16 MED ORDER — CHLORHEXIDINE GLUCONATE 0.12 % MT SOLN
15.0000 mL | Freq: Two times a day (BID) | OROMUCOSAL | Status: DC
  Administered 2015-09-16 – 2015-09-18 (×4): 15 mL via OROMUCOSAL
  Filled 2015-09-16 (×6): qty 15

## 2015-09-16 MED ORDER — SODIUM CHLORIDE 0.9 % IV BOLUS (SEPSIS)
500.0000 mL | Freq: Once | INTRAVENOUS | Status: AC
  Administered 2015-09-16: 500 mL via INTRAVENOUS

## 2015-09-16 MED ORDER — ONDANSETRON HCL 4 MG/2ML IJ SOLN
4.0000 mg | Freq: Four times a day (QID) | INTRAMUSCULAR | Status: DC | PRN
  Administered 2015-09-18 – 2015-09-19 (×4): 4 mg via INTRAVENOUS
  Filled 2015-09-16 (×4): qty 2

## 2015-09-16 MED ORDER — SODIUM CHLORIDE 0.9 % IV SOLN: INTRAVENOUS | Status: DC

## 2015-09-16 MED ORDER — MAGIC MOUTHWASH W/LIDOCAINE
5.0000 mL | Freq: Three times a day (TID) | ORAL | Status: DC | PRN
  Administered 2015-09-17: 5 mL via ORAL
  Filled 2015-09-16 (×2): qty 5

## 2015-09-16 MED ORDER — ONDANSETRON HCL 4 MG PO TABS: 4.0000 mg | ORAL_TABLET | Freq: Four times a day (QID) | ORAL | Status: DC | PRN

## 2015-09-16 MED ORDER — OXYCODONE-ACETAMINOPHEN 5-325 MG PO TABS
1.0000 | ORAL_TABLET | Freq: Four times a day (QID) | ORAL | Status: DC | PRN
  Administered 2015-09-18: 1 via ORAL
  Filled 2015-09-16: qty 1

## 2015-09-16 MED ORDER — SODIUM CHLORIDE 0.9 % IV SOLN
1000.0000 mL | Freq: Once | INTRAVENOUS | Status: AC
  Administered 2015-09-16: 1000 mL via INTRAVENOUS

## 2015-09-16 MED ORDER — SODIUM CHLORIDE 0.9 % IV SOLN
INTRAVENOUS | Status: DC
  Administered 2015-09-16 (×2): via INTRAVENOUS

## 2015-09-16 MED ORDER — PANTOPRAZOLE SODIUM 40 MG IV SOLR
40.0000 mg | Freq: Two times a day (BID) | INTRAVENOUS | Status: DC
  Administered 2015-09-16 – 2015-09-18 (×5): 40 mg via INTRAVENOUS
  Filled 2015-09-16 (×6): qty 40

## 2015-09-16 MED ORDER — PROCHLORPERAZINE MALEATE 10 MG PO TABS
10.0000 mg | ORAL_TABLET | Freq: Four times a day (QID) | ORAL | Status: DC | PRN
  Filled 2015-09-16: qty 1

## 2015-09-16 MED ORDER — CETYLPYRIDINIUM CHLORIDE 0.05 % MT LIQD: 7.0000 mL | Freq: Two times a day (BID) | OROMUCOSAL | Status: DC

## 2015-09-16 MED ORDER — ADULT MULTIVITAMIN W/MINERALS CH
1.0000 | ORAL_TABLET | Freq: Every day | ORAL | Status: DC
  Administered 2015-09-19: 1 via ORAL
  Filled 2015-09-16 (×3): qty 1

## 2015-09-16 MED ORDER — MORPHINE SULFATE (PF) 2 MG/ML IV SOLN
1.0000 mg | INTRAVENOUS | Status: DC | PRN
  Administered 2015-09-16: 1 mg via INTRAVENOUS
  Filled 2015-09-16: qty 1

## 2015-09-16 MED ORDER — PANTOPRAZOLE SODIUM 40 MG IV SOLR
40.0000 mg | Freq: Once | INTRAVENOUS | Status: AC
  Administered 2015-09-16: 40 mg via INTRAVENOUS
  Filled 2015-09-16: qty 40

## 2015-09-16 NOTE — H&P (Addendum)
[facility] History and Physical  KIYO HEAL GUY:[id number] DOB: [birth year] DOA: 09/16/2015  Referring physician: Dr. Vallery Ridge. PCP: No PCP Per Patient  Specialists: Dr. Julien Nordmann. Oncologist.  Chief Complaint: Chest pain.  HPI: Theresa [name] is a 54 y.o. female with history of recently diagnosed stage III non-small cell lung cancer presents to the ER because of chest pain. Over the last 2 days patient has had some difficulty swallowing. But since today morning patient has been having right-sided chest pain which has been both pleuritic and constant sometimes. Increases on movement. Denies any productive cough fever chills. In the ER patient was found to be tachycardic and CT angiogram of the chest was done which did not show anything acute. Since patient has persistent pain and tachycardic patient has been admitted for further management. Initial lactic acid levels were elevated which improved with fluids.   Review of Systems: As presented in the history of presenting illness, rest negative.  Past Medical History  Diagnosis Date  . Pneumonia   . Cancer ([place])     5 years ago - cervical cancer  . Non-small cell carcinoma of lung, stage 3 ([place]) 08/08/2015  . Encounter for antineoplastic chemotherapy 08/27/2015   Past Surgical History  Procedure Laterality Date  . Wisdom tooth extraction    . Tubal ligation    . Video bronchoscopy with endobronchial ultrasound N/A 08/02/2015    Procedure: VIDEO BRONCHOSCOPY WITH ENDOBRONCHIAL ULTRASOUND;  Surgeon: [name], MD;  Location: [facility];  Service: Thoracic;  Laterality: N/A;   Social History:  reports that she quit smoking about 25 years ago. Her smoking use included Cigarettes. She has a 1.5 pack-year smoking history. She has never used smokeless tobacco. She reports that she does not drink alcohol or use illicit drugs. Where does patient live at home. Can patient participate in ADLs? Yes.  No Known Allergies  Family  History:  Family History  Problem Relation Age of Onset  . COPD Mother   . Diabetes type II Mother   . Heart disease Mother   . High Cholesterol Mother   . Lung cancer Father       Prior to Admission medications   Medication Sig Start Date End Date Taking? Authorizing Provider  Calcium Carb-Cholecalciferol (CALCIUM 600 + D) 600-200 MG-UNIT TABS Take 2 capsules by mouth daily.   Yes Historical Provider, MD  chlorpheniramine-HYDROcodone (TUSSIONEX) 10-8 MG/5ML SUER Take 5 mLs by mouth every 12 (twelve) hours as needed for cough. 08/27/15  Yes Rondel Jumbo, PA-C  magic mouthwash w/lidocaine SOLN Take 5 mLs by mouth 3 (three) times daily as needed for mouth pain. 09/09/15  Yes Rondel Jumbo, PA-C  Multiple Vitamin (MULTIVITAMIN WITH MINERALS) TABS tablet Take 1 tablet by mouth daily.   Yes Historical Provider, MD  oxyCODONE-acetaminophen (PERCOCET/ROXICET) 5-325 MG tablet Take 1 tablet by mouth every 6 (six) hours as needed for severe pain. 09/09/15  Yes Coralee Pesa Wertman, PA-C  prochlorperazine (COMPAZINE) 10 MG tablet Take 1 tablet (10 mg total) by mouth every 6 (six) hours as needed for nausea or vomiting. 08/08/15  Yes Curt Bears, MD  sucralfate (CARAFATE) 1 G tablet Take 1 tablet (1 g total) by mouth 4 (four) times daily. 09/13/15  Yes Kyung Rudd, MD  Wound Cleansers (RADIAPLEX EX) Apply topically.    Historical Provider, MD    Physical Exam: Filed Vitals:   09/16/15 1930 09/16/15 1947 09/16/15 2026 09/16/15 2031  BP: 123/82 130/76  129/78  Pulse:  117  122  Temp:    98.5 F (36.9 C)  TempSrc:    Oral  Resp: 24 23    Height:   '5\' 8"'$  (1.727 m)   Weight:   96.571 kg (212 lb 14.4 oz)   SpO2: 95% 96%  96%     General:  Moderately built and nourished.  Eyes: Anicteric no pallor.  ENT: No discharge from the ears eyes nose and mouth.  Neck: No mass felt.  Cardiovascular: S1-S2 heard tachycardic.  Respiratory: No rhonchi or crepitations.  Abdomen: Soft nontender bowel  sounds present.  Skin: No rash.  Musculoskeletal: No edema.  Psychiatric: Appears normal.  Neurologic: Alert awake oriented to time place and person. Moves all extremities.  Labs on Admission:  Basic Metabolic Panel:  Recent Labs Lab 09/16/15 1528  NA 138  K 3.8  CL 101  CO2 23  GLUCOSE 144*  BUN 13  CREATININE 0.92  CALCIUM 10.0   Liver Function Tests:  Recent Labs Lab 09/16/15 1529  AST 24  ALT 20  ALKPHOS 78  BILITOT 1.1  PROT 8.7*  ALBUMIN 4.3   No results for input(s): LIPASE, AMYLASE in the last 168 hours. No results for input(s): AMMONIA in the last 168 hours. CBC:  Recent Labs Lab 09/16/15 1528  WBC 2.1*  HGB 11.5*  HCT 34.2*  MCV 87.0  PLT 188   Cardiac Enzymes:  Recent Labs Lab 09/16/15 1529  TROPONINI <0.03    BNP (last 3 results) No results for input(s): BNP in the last 8760 hours.  ProBNP (last 3 results) No results for input(s): PROBNP in the last 8760 hours.  CBG: No results for input(s): GLUCAP in the last 168 hours.  Radiological Exams on Admission: Dg Chest 2 View  09/16/2015  CLINICAL DATA:  54 year old female with right-sided chest pain and a history of stage III non-small cell carcinoma of the right lung EXAM: CHEST  2 VIEW COMPARISON:  Recent prior PET-CT 08/07/2015 ; prior chest x-ray 08/01/2015 FINDINGS: Cardiac and mediastinal contours are within normal limits. Significantly decreased atelectasis and opacity in the right middle lobe compared to prior imaging. Right hilar fullness is also less conspicuous than previously seen. Stable chronic bronchitic change. Probable bibasilar subsegmental atelectasis. No pneumothorax, pleural effusion or new focal airspace consolidation. No acute osseous abnormality. IMPRESSION: 1. No acute cardiopulmonary disease. 2. Evidence of interval treatment effect with significantly less conspicuous right hilar mass and associated right middle lobe atelectasis compared to imaging from November  2016. Electronically Signed   By: Jacqulynn Cadet M.D.   On: 09/16/2015 16:34   Ct Angio Chest Pe W/cm &/or Wo Cm  09/16/2015  CLINICAL DATA:  54 year old female with chest pain and known lung cancer currently undergoing chemotherapy. EXAM: CT ANGIOGRAPHY CHEST WITH CONTRAST TECHNIQUE: Multidetector CT imaging of the chest was performed using the standard protocol during bolus administration of intravenous contrast. Multiplanar CT image reconstructions and MIPs were obtained to evaluate the vascular anatomy. CONTRAST:  119m OMNIPAQUE IOHEXOL 350 MG/ML SOLN COMPARISON:  Chest x-ray obtained earlier today ; prior PET-CT 08/07/2015 FINDINGS: Mediastinum: Decreasing mediastinal adenopathy. Index right paratracheal lymph node measures 12 mm in short axis compared to 20 mm previously. The subcarinal lymphadenopathy has also decreased in size at 17 mm in short axis compared to 20 mm previously. The right hilar mass remains a amorphous and difficult to measure discretely. However, the abnormality appears less full and there is improving aeration of the right middle lobe bronchi with a  commensurate decrease in right middle lobe atelectasis. Heart/Vascular: Aberrant right subclavian artery. No aortic aneurysm or dissection. Adequate opacification of the pulmonary arteries to the proximal segmental level. No evidence of focal central filling defect to suggest acute pulmonary embolus. The heart is within normal limits for size. No pericardial effusion. Lungs/Pleura: Significantly decreased size of the right perihilar/medial right upper lobe pulmonary mass compared to the prior PET-CT. Although difficult to measure precisely given the a amorphous configuration, the mass measures approximately 3.2 by 3.4 cm compared to 6.1 x 4.4 cm previously. Additionally, there is less atelectasis in the right upper and right middle lobe. Persistent attenuation of the right middle lobe pulmonary arterial branches. Unchanged past she had  ground-glass attenuation opacity in the right lung apex of uncertain clinical significance. No new pulmonary mass or nodule. Dependent atelectasis in both lower lobes. Bones/Soft Tissues: No acute fracture or aggressive appearing lytic or blastic osseous lesion. Upper Abdomen: Stable probable 12 mm cyst in the hepatic dome. Otherwise, visualized upper abdominal organs are unremarkable. Review of the MIP images confirms the above findings. IMPRESSION: 1. Negative for acute pulmonary embolus, pneumonia or other acute cardiopulmonary process. 2. Interval response to therapy with significantly decreased size of medial right upper lobe/right perihilar pulmonary mass and associated right hilar and mediastinal metastatic adenopathy. 3. Decreasing right upper and middle lobe atelectasis with partial re- opening of the right middle lobe bronchi. 4. Aberrant right subclavian artery again noted. 5. Additional ancillary findings as above without interval change. Electronically Signed   By: Jacqulynn Cadet M.D.   On: 09/16/2015 17:30    EKG: Independently reviewed. Sinus tachycardia.  Assessment/Plan Principal Problem:   Chest pain Active Problems:   Non-small cell carcinoma of lung, stage 3 (HCC)   Tachycardia   Pain in the chest   1. Chest pain - appears atypical. Cause not clear may be related to radiation. At this time we will cycle cardiac markers check 2-D echo and I have placed patient on when necessary pain relief medications. Closely observe. 2. Sinus tachycardia - may be related to pain. If persistent will consult cardiology. Check thyroid function tests. 3. Dysphagia - probably related to radiation. I have placed patient on Carafate and Protonix. Swallow team evaluation. 4. Anemia probably related to chemotherapy - follow CBC. 5. Non-small cell lung cancer on chemoradiation per oncology.   DVT Prophylaxis Lovenox.  Code Status: Full code.  Family Communication: Family at the bedside.   Disposition Plan: Admit for observation.    Zadiel Leyh N. [facility] Pager [phone].  If 7PM-7AM, please contact night-coverage [email or web address] Password [email or web address] 09/16/2015, 10:40 PM

## 2015-09-16 NOTE — ED Notes (Signed)
Pt complaint of chest pain onset 0800 today and anorexia since Thursday; last chemo Monday and last radiation Friday.

## 2015-09-16 NOTE — ED Notes (Signed)
Patient transported to CT 

## 2015-09-16 NOTE — ED Provider Notes (Signed)
CSN: [id number]     Arrival date & time 09/16/15  1503 History   First MD Initiated Contact with Patient 09/16/15 1530     Chief Complaint  Patient presents with  . Chest Pain     (Consider location/radiation/quality/duration/timing/severity/associated sxs/prior Treatment) HPI Patient has non-small cell, squamous cell lung carcinoma. She has had 4 chemotherapy and radiation treatments. She reports she was having some difficulty swallowing due to her radiation therapy. She reports this was anticipated. She states there was some discomfort. She reports however as of this morning she has severe right-sided chest pain that is burning and aching in quality. It is just to the right of her midline. She reports she doesn't feel specifically short of breath but does feel like it's hard to get all her air out. She denies she's had any fever. She denies she has any lower leg swelling or calf pain. She denies prior history of PE or DVT. He denies she's had vomiting or diarrhea. She states up until this morning things seemed to be going fairly well and all symptoms were adequately controlled. Past Medical History  Diagnosis Date  . Pneumonia   . Cancer ([place])     5 years ago - cervical cancer  . Non-small cell carcinoma of lung, stage 3 ([place]) 08/08/2015  . Encounter for antineoplastic chemotherapy 08/27/2015   Past Surgical History  Procedure Laterality Date  . Wisdom tooth extraction    . Tubal ligation    . Video bronchoscopy with endobronchial ultrasound N/A 08/02/2015    Procedure: VIDEO BRONCHOSCOPY WITH ENDOBRONCHIAL ULTRASOUND;  Surgeon: [name], MD;  Location: [facility] OR;  Service: Thoracic;  Laterality: N/A;   Family History  Problem Relation Age of Onset  . COPD Mother   . Diabetes type II Mother   . Heart disease Mother   . High Cholesterol Mother   . Lung cancer Father    Social History  Substance Use Topics  . Smoking status: Former Smoker -- 0.30 packs/day for 5 years     Types: Cigarettes    Quit date: 07/31/1990  . Smokeless tobacco: Never Used  . Alcohol Use: No   OB History    No data available     Review of Systems  10 Systems reviewed and are negative for acute change except as noted in the HPI.   Allergies  Review of patient's allergies indicates no known allergies.  Home Medications   Prior to Admission medications   Medication Sig Start Date End Date Taking? Authorizing Provider  Calcium Carb-Cholecalciferol (CALCIUM 600 + D) 600-200 MG-UNIT TABS Take 2 capsules by mouth daily.   Yes Historical Provider, MD  chlorpheniramine-HYDROcodone (TUSSIONEX) 10-8 MG/5ML SUER Take 5 mLs by mouth every 12 (twelve) hours as needed for cough. 08/27/15  Yes Rondel Jumbo, PA-C  magic mouthwash w/lidocaine SOLN Take 5 mLs by mouth 3 (three) times daily as needed for mouth pain. 09/09/15  Yes Rondel Jumbo, PA-C  Multiple Vitamin (MULTIVITAMIN WITH MINERALS) TABS tablet Take 1 tablet by mouth daily.   Yes Historical Provider, MD  oxyCODONE-acetaminophen (PERCOCET/ROXICET) 5-325 MG tablet Take 1 tablet by mouth every 6 (six) hours as needed for severe pain. 09/09/15  Yes Coralee Pesa Wertman, PA-C  prochlorperazine (COMPAZINE) 10 MG tablet Take 1 tablet (10 mg total) by mouth every 6 (six) hours as needed for nausea or vomiting. 08/08/15  Yes Curt Bears, MD  sucralfate (CARAFATE) 1 G tablet Take 1 tablet (1 g total) by mouth 4 (  four) times daily. 09/13/15  Yes Kyung Rudd, MD  Wound Cleansers (RADIAPLEX EX) Apply topically.    Historical Provider, MD   BP 132/95 mmHg  Pulse 133  Temp(Src) 98.5 F (36.9 C) (Oral)  Resp 33  Ht '5\' 8"'$  (1.727 m)  Wt 219 lb (99.338 kg)  BMI 33.31 kg/m2  SpO2 93% Physical Exam  Constitutional: She is oriented to person, place, and time. She appears well-developed and well-nourished.  Patient is nontoxic and alert. She is mildly tachypneic but does not appear significant bleed dyspneic. She does appear to be in pain.  HENT:   Head: Normocephalic and atraumatic.  Mouth/Throat: Oropharynx is clear and moist.  Eyes: EOM are normal. Pupils are equal, round, and reactive to light.  Neck: Neck supple.  Cardiovascular: Regular rhythm, normal heart sounds and intact distal pulses.   Tachycardia.  Pulmonary/Chest:  Tachypnea. Speaking easily in full sentences. Questionable slight rail on the right. Good air flow to basis. No gross rhonchi or wheeze.  Abdominal: Soft. Bowel sounds are normal. She exhibits no distension. There is no tenderness.  Musculoskeletal: Normal range of motion. She exhibits no edema or tenderness.  Neurological: She is alert and oriented to person, place, and time. She has normal strength. Coordination normal. GCS eye subscore is 4. GCS verbal subscore is 5. GCS motor subscore is 6.  Skin: Skin is warm, dry and intact.  Psychiatric: She has a normal mood and affect.    ED Course  Procedures (including critical care time) CRITICAL CARE Performed by: Charlesetta Shanks   Total critical care time: 45 minutes  Critical care time was exclusive of separately billable procedures and treating other patients.  Critical care was necessary to treat or prevent imminent or life-threatening deterioration.  Critical care was time spent personally by me on the following activities: development of treatment plan with patient and/or surrogate as well as nursing, discussions with consultants, evaluation of patient's response to treatment, examination of patient, obtaining history from patient or surrogate, ordering and performing treatments and interventions, ordering and review of laboratory studies, ordering and review of radiographic studies, pulse oximetry and re-evaluation of patient's condition. Labs Review Labs Reviewed  BASIC METABOLIC PANEL - Abnormal; Notable for the following:    Glucose, Bld 144 (*)    All other components within normal limits  CBC - Abnormal; Notable for the following:    WBC 2.1  (*)    Hemoglobin 11.5 (*)    HCT 34.2 (*)    RDW 17.3 (*)    All other components within normal limits  HEPATIC FUNCTION PANEL - Abnormal; Notable for the following:    Total Protein 8.7 (*)    All other components within normal limits  I-STAT CG4 LACTIC ACID, ED - Abnormal; Notable for the following:    Lactic Acid, Venous 2.49 (*)    All other components within normal limits  TROPONIN I  I-STAT TROPOININ, ED  I-STAT CG4 LACTIC ACID, ED    Imaging Review Dg Chest 2 View  09/16/2015  CLINICAL DATA:  54 year old female with right-sided chest pain and a history of stage III non-small cell carcinoma of the right lung EXAM: CHEST  2 VIEW COMPARISON:  Recent prior PET-CT 08/07/2015 ; prior chest x-ray 08/01/2015 FINDINGS: Cardiac and mediastinal contours are within normal limits. Significantly decreased atelectasis and opacity in the right middle lobe compared to prior imaging. Right hilar fullness is also less conspicuous than previously seen. Stable chronic bronchitic change. Probable bibasilar subsegmental atelectasis. No  pneumothorax, pleural effusion or new focal airspace consolidation. No acute osseous abnormality. IMPRESSION: 1. No acute cardiopulmonary disease. 2. Evidence of interval treatment effect with significantly less conspicuous right hilar mass and associated right middle lobe atelectasis compared to imaging from November 2016. Electronically Signed   By: Jacqulynn Cadet M.D.   On: 09/16/2015 16:34   Ct Angio Chest Pe W/cm &/or Wo Cm  09/16/2015  CLINICAL DATA:  54 year old female with chest pain and known lung cancer currently undergoing chemotherapy. EXAM: CT ANGIOGRAPHY CHEST WITH CONTRAST TECHNIQUE: Multidetector CT imaging of the chest was performed using the standard protocol during bolus administration of intravenous contrast. Multiplanar CT image reconstructions and MIPs were obtained to evaluate the vascular anatomy. CONTRAST:  153m OMNIPAQUE IOHEXOL 350 MG/ML SOLN  COMPARISON:  Chest x-ray obtained earlier today ; prior PET-CT 08/07/2015 FINDINGS: Mediastinum: Decreasing mediastinal adenopathy. Index right paratracheal lymph node measures 12 mm in short axis compared to 20 mm previously. The subcarinal lymphadenopathy has also decreased in size at 17 mm in short axis compared to 20 mm previously. The right hilar mass remains a amorphous and difficult to measure discretely. However, the abnormality appears less full and there is improving aeration of the right middle lobe bronchi with a commensurate decrease in right middle lobe atelectasis. Heart/Vascular: Aberrant right subclavian artery. No aortic aneurysm or dissection. Adequate opacification of the pulmonary arteries to the proximal segmental level. No evidence of focal central filling defect to suggest acute pulmonary embolus. The heart is within normal limits for size. No pericardial effusion. Lungs/Pleura: Significantly decreased size of the right perihilar/medial right upper lobe pulmonary mass compared to the prior PET-CT. Although difficult to measure precisely given the a amorphous configuration, the mass measures approximately 3.2 by 3.4 cm compared to 6.1 x 4.4 cm previously. Additionally, there is less atelectasis in the right upper and right middle lobe. Persistent attenuation of the right middle lobe pulmonary arterial branches. Unchanged past she had ground-glass attenuation opacity in the right lung apex of uncertain clinical significance. No new pulmonary mass or nodule. Dependent atelectasis in both lower lobes. Bones/Soft Tissues: No acute fracture or aggressive appearing lytic or blastic osseous lesion. Upper Abdomen: Stable probable 12 mm cyst in the hepatic dome. Otherwise, visualized upper abdominal organs are unremarkable. Review of the MIP images confirms the above findings. IMPRESSION: 1. Negative for acute pulmonary embolus, pneumonia or other acute cardiopulmonary process. 2. Interval response to  therapy with significantly decreased size of medial right upper lobe/right perihilar pulmonary mass and associated right hilar and mediastinal metastatic adenopathy. 3. Decreasing right upper and middle lobe atelectasis with partial re- opening of the right middle lobe bronchi. 4. Aberrant right subclavian artery again noted. 5. Additional ancillary findings as above without interval change. Electronically Signed   By: HJacqulynn CadetM.D.   On: 09/16/2015 17:30   I have personally reviewed and evaluated these images and lab results as part of my medical decision-making.   EKG Interpretation   Date/Time:  Monday September 16 2015 15:10:53 EST Ventricular Rate:  154 PR Interval:  246 QRS Duration: 78 QT Interval:  318 QTC Calculation: 509 R Axis:   10 Text Interpretation:  Sinus tachycardia Prolonged PR interval Consider  right atrial enlargement RSR' in V1 or V2, probably normal variant  Borderline ST depression, lateral leads Abnormal T, consider ischemia,  diffuse leads Baseline wander in lead(s) II III aVF V3 V6 agree. no old  Confirmed by PJohnney Killian MD, MJeannie Done([phone] on 09/16/2015 3:31:26 PM  MDM   Final diagnoses:  Chest pain, unspecified chest pain type  Malignant neoplasm of overlapping sites of right lung ([place])  Tachycardia   Patient presents with chest pain and tachycardia with known lung cancer. Primary concern was for PE/pneumonia/pericardial effusion. CT chest does not show pulmonary embolus or other acute etiology such as pneumonia or gross pericardial effusion. Patient does remain tachycardic and has been fluid resuscitated. Mental status remains clear. Patient has tachypnea but is supporting her airway well without signs of impending respiratory failure. Patient will be admitted for ongoing diagnostic evaluation as needed and symptomatic treatment.    Charlesetta Shanks, MD 09/16/15 [phone]

## 2015-09-16 NOTE — ED Notes (Signed)
Patient transported to X-ray 

## 2015-09-17 ENCOUNTER — Ambulatory Visit: Payer: Medicare Other

## 2015-09-17 ENCOUNTER — Encounter (HOSPITAL_COMMUNITY): Payer: Self-pay | Admitting: Student

## 2015-09-17 ENCOUNTER — Ambulatory Visit
Admission: RE | Admit: 2015-09-17 | Discharge: 2015-09-17 | Disposition: A | Discharge status: Home / Self Care | Payer: Medicare Other | Source: Ambulatory Visit | Attending: Radiation Oncology | Admitting: Radiation Oncology

## 2015-09-17 ENCOUNTER — Other Ambulatory Visit: Payer: Medicare Other

## 2015-09-17 ENCOUNTER — Observation Stay (HOSPITAL_BASED_OUTPATIENT_CLINIC_OR_DEPARTMENT_OTHER): Payer: Medicare Other

## 2015-09-17 DIAGNOSIS — E440 Moderate protein-calorie malnutrition: Secondary | ICD-10-CM

## 2015-09-17 DIAGNOSIS — R000 Tachycardia, unspecified: Secondary | ICD-10-CM

## 2015-09-17 DIAGNOSIS — Z87891 Personal history of nicotine dependence: Secondary | ICD-10-CM | POA: Diagnosis not present

## 2015-09-17 DIAGNOSIS — R112 Nausea with vomiting, unspecified: Secondary | ICD-10-CM | POA: Diagnosis not present

## 2015-09-17 DIAGNOSIS — K208 Other esophagitis: Secondary | ICD-10-CM | POA: Diagnosis not present

## 2015-09-17 DIAGNOSIS — K5900 Constipation, unspecified: Secondary | ICD-10-CM | POA: Diagnosis not present

## 2015-09-17 DIAGNOSIS — C3491 Malignant neoplasm of unspecified part of right bronchus or lung: Secondary | ICD-10-CM | POA: Diagnosis not present

## 2015-09-17 DIAGNOSIS — I483 Typical atrial flutter: Secondary | ICD-10-CM | POA: Diagnosis not present

## 2015-09-17 DIAGNOSIS — D630 Anemia in neoplastic disease: Secondary | ICD-10-CM | POA: Diagnosis not present

## 2015-09-17 DIAGNOSIS — D61810 Antineoplastic chemotherapy induced pancytopenia: Secondary | ICD-10-CM | POA: Diagnosis not present

## 2015-09-17 DIAGNOSIS — R740 Nonspecific elevation of levels of transaminase and lactic acid dehydrogenase [LDH]: Secondary | ICD-10-CM | POA: Diagnosis not present

## 2015-09-17 DIAGNOSIS — R1310 Dysphagia, unspecified: Secondary | ICD-10-CM | POA: Diagnosis not present

## 2015-09-17 DIAGNOSIS — R079 Chest pain, unspecified: Secondary | ICD-10-CM

## 2015-09-17 DIAGNOSIS — C3481 Malignant neoplasm of overlapping sites of right bronchus and lung: Secondary | ICD-10-CM | POA: Diagnosis not present

## 2015-09-17 DIAGNOSIS — Z801 Family history of malignant neoplasm of trachea, bronchus and lung: Secondary | ICD-10-CM | POA: Diagnosis not present

## 2015-09-17 DIAGNOSIS — C342 Malignant neoplasm of middle lobe, bronchus or lung: Secondary | ICD-10-CM | POA: Diagnosis not present

## 2015-09-17 DIAGNOSIS — Z8541 Personal history of malignant neoplasm of cervix uteri: Secondary | ICD-10-CM | POA: Diagnosis not present

## 2015-09-17 LAB — T4, FREE: Free T4: 0.99 ng/dL (ref 0.61–1.12)

## 2015-09-17 LAB — CBC WITH DIFFERENTIAL/PLATELET
BASOS ABS: 0 10*3/uL (ref 0.0–0.1)
Basophils Relative: 0 %
EOS ABS: 0 10*3/uL (ref 0.0–0.7)
Eosinophils Relative: 1 %
HCT: 28.9 % — ABNORMAL LOW (ref 36.0–46.0)
HEMOGLOBIN: 9.6 g/dL — AB (ref 12.0–15.0)
LYMPHS PCT: 25 %
Lymphs Abs: 0.4 10*3/uL — ABNORMAL LOW (ref 0.7–4.0)
MCH: 28.7 pg (ref 26.0–34.0)
MCHC: 33.2 g/dL (ref 30.0–36.0)
MCV: 86.5 fL (ref 78.0–100.0)
Monocytes Absolute: 0.2 10*3/uL (ref 0.1–1.0)
Monocytes Relative: 11 %
NEUTROS PCT: 63 %
Neutro Abs: 0.9 10*3/uL — ABNORMAL LOW (ref 1.7–7.7)
Platelets: 144 10*3/uL — ABNORMAL LOW (ref 150–400)
RBC: 3.34 MIL/uL — ABNORMAL LOW (ref 3.87–5.11)
RDW: 17.7 % — ABNORMAL HIGH (ref 11.5–15.5)
WBC: 1.5 10*3/uL — AB (ref 4.0–10.5)

## 2015-09-17 LAB — COMPREHENSIVE METABOLIC PANEL
ALK PHOS: 70 U/L (ref 38–126)
ALT: 16 U/L (ref 14–54)
ANION GAP: 9 (ref 5–15)
AST: 16 U/L (ref 15–41)
Albumin: 3.4 g/dL — ABNORMAL LOW (ref 3.5–5.0)
BILIRUBIN TOTAL: 1.1 mg/dL (ref 0.3–1.2)
BUN: 9 mg/dL (ref 6–20)
CALCIUM: 9 mg/dL (ref 8.9–10.3)
CO2: 24 mmol/L (ref 22–32)
Chloride: 107 mmol/L (ref 101–111)
Creatinine, Ser: 0.74 mg/dL (ref 0.44–1.00)
GFR calc non Af Amer: >60 mL/min (ref >60)
Glucose, Bld: 120 mg/dL — ABNORMAL HIGH (ref 65–99)
Potassium: 4 mmol/L (ref 3.5–5.1)
Sodium: 140 mmol/L (ref 135–145)
TOTAL PROTEIN: 7.1 g/dL (ref 6.5–8.1)

## 2015-09-17 LAB — LACTIC ACID, PLASMA: LACTIC ACID, VENOUS: 0.9 mmol/L (ref 0.5–2.0)

## 2015-09-17 LAB — TROPONIN I
Troponin I: <0.03 ng/mL (ref <0.031)
Troponin I: <0.03 ng/mL (ref <0.031)

## 2015-09-17 LAB — TSH: TSH: 3.74 u[IU]/mL (ref 0.350–4.500)

## 2015-09-17 MED ORDER — APIXABAN 5 MG PO TABS
5.0000 mg | ORAL_TABLET | Freq: Two times a day (BID) | ORAL | Status: DC
  Filled 2015-09-17: qty 1

## 2015-09-17 MED ORDER — LABETALOL HCL 5 MG/ML IV SOLN
5.0000 mg | INTRAVENOUS | Status: DC | PRN
  Filled 2015-09-17: qty 4

## 2015-09-17 MED ORDER — GI COCKTAIL ~~LOC~~
30.0000 mL | Freq: Three times a day (TID) | ORAL | Status: DC | PRN
Start: 2015-09-17 — End: 2015-09-19
  Administered 2015-09-17 – 2015-09-18 (×2): 30 mL via ORAL
  Filled 2015-09-17 (×5): qty 30

## 2015-09-17 MED ORDER — PANTOPRAZOLE SODIUM 40 MG PO TBEC: 40.0000 mg | DELAYED_RELEASE_TABLET | Freq: Two times a day (BID) | ORAL | Status: DC

## 2015-09-17 MED ORDER — SUCRALFATE 1 GM/10ML PO SUSP: 1.0000 g | Freq: Three times a day (TID) | ORAL | Status: DC

## 2015-09-17 MED ORDER — BISMUTH SUBSALICYLATE 262 MG/15ML PO SUSP: 30.0000 mL | Freq: Four times a day (QID) | ORAL | Status: DC | PRN

## 2015-09-17 MED ORDER — DILTIAZEM HCL 100 MG IV SOLR: 5.0000 mg/h | INTRAVENOUS | Status: DC

## 2015-09-17 MED ORDER — ENSURE ENLIVE PO LIQD
237.0000 mL | Freq: Three times a day (TID) | ORAL | Status: DC
  Administered 2015-09-17 – 2015-09-18 (×4): 237 mL via ORAL

## 2015-09-17 MED ORDER — METOPROLOL TARTRATE 25 MG PO TABS: 12.5000 mg | ORAL_TABLET | Freq: Two times a day (BID) | ORAL | Status: DC

## 2015-09-17 MED ORDER — SUCRALFATE 1 GM/10ML PO SUSP: 1.0000 g | Freq: Four times a day (QID) | ORAL | Status: DC

## 2015-09-17 MED ORDER — HYDROMORPHONE HCL 1 MG/ML IJ SOLN
1.0000 mg | INTRAMUSCULAR | Status: DC | PRN
  Administered 2015-09-17 – 2015-09-19 (×5): 1 mg via INTRAVENOUS
  Filled 2015-09-17 (×5): qty 1

## 2015-09-17 MED ORDER — SUCRALFATE 1 GM/10ML PO SUSP
1.0000 g | Freq: Four times a day (QID) | ORAL | Status: DC
  Administered 2015-09-17 – 2015-09-19 (×8): 1 g via ORAL
  Filled 2015-09-17 (×9): qty 10

## 2015-09-17 MED ORDER — METOPROLOL TARTRATE 25 MG PO TABS
12.5000 mg | ORAL_TABLET | Freq: Two times a day (BID) | ORAL | Status: DC
  Administered 2015-09-17 – 2015-09-18 (×3): 12.5 mg via ORAL
  Filled 2015-09-17 (×3): qty 1

## 2015-09-17 NOTE — Progress Notes (Signed)
Spoke with patient's nurse, Barnetta Chapel. She reports the patient has converted from atrial flutter to normal sinus rhythm without intervention. She confirms per the cardiologist that the patient is safe to receive radiation treatment. Informed Merrilee Seashore, RT on L3 of these findings.

## 2015-09-17 NOTE — Progress Notes (Signed)
Initial Nutrition Assessment  DOCUMENTATION CODES:   Non-severe (moderate) malnutrition in context of acute illness/injury, Obesity unspecified  INTERVENTION:  - Will order Ensure Enlive TID, each supplement provides 350 kcal and 20 grams of protein - Diet advancement as/if medically feasible - RD will continue to monitor for needs  NUTRITION DIAGNOSIS:   Increased nutrient needs related to catabolic illness, cancer and cancer related treatments as evidenced by estimated needs.  GOAL:   Patient will meet greater than or equal to 90% of their needs  MONITOR:   PO intake, Supplement acceptance, Weight trends, Labs, I & O's  REASON FOR ASSESSMENT:   Consult Assessment of nutrition requirement/status  ASSESSMENT:   54 y.o. female with history of recently diagnosed stage III non-small cell lung cancer presents to the ER because of chest pain. Over the last 2 days patient has had some difficulty swallowing. But since today morning patient has been having right-sided chest pain which has been both pleuritic and constant sometimes. Increases on movement. Denies any productive cough fever chills. In the ER patient was found to be tachycardic and CT angiogram of the chest was done which did not show anything acute. Since patient has persistent pain and tachycardic patient has been admitted for further management. Initial lactic acid levels were elevated which improved with fluids.   Pt seen for consult. BMI indicates obesity. Pt on FLD and consumed 100% of breakfast this AM, per chart review. Pt reports ongoing throat pain but denies abdominal pain or nausea with intakes. She states throat pain is constant and makes it difficult for her to consume foods and liquids and that swallowing difficulty occurs with all foods and liquids. She denies chewing difficulties She states that since starting chemotherapy she has experienced taste alteration in the form of heightened sweet sensation and metallic  taste. She has switched to using plastic utensils which has slightly helped. Per chart, pt's last chemo treatment was 12/19 and last radiation treatment was 12/23.  Pt reports UBW of 219 lbs. Per chart review, pt last weighed this on 09/09/15 with 7 lbs weight loss (3% body weight) in the past 8 days which is significant for time frame. No muscle or fat wasting present at this time. Pt reports minimal intakes PTA due to throat pain and difficulty swallowing and that she would cut foods into very small bites in an attempt to assist with this.   She was not drinking nutrition supplements PTA but is interested in received Ensure Enlive now to help with intake of kcal and protein. Not meeting needs. Medications reviewed. Labs reviewed.   Diet Order:  Diet full liquid Room service appropriate?: Yes; Fluid consistency:: Thin  Skin:  Reviewed, no issues  Last BM:  PTA  Height:   Ht Readings from Last 1 Encounters:  09/16/15 '5\' 8"'$  (1.727 m)    Weight:   Wt Readings from Last 1 Encounters:  09/16/15 212 lb 14.4 oz (96.571 kg)    Ideal Body Weight:  63.64 kg (kg)  BMI:  Body mass index is 32.38 kg/(m^2).  Estimated Nutritional Needs:   Kcal:  2400-2600  Protein:  100-110 grams  Fluid:  >/= 2.2 L/day  EDUCATION NEEDS:   No education needs identified at this time     Jarome Matin, RD, LDN Inpatient Clinical Dietitian Pager # [phone] After hours/weekend pager # [phone]

## 2015-09-17 NOTE — Discharge Summary (Addendum)
Physician Discharge Summary  Theresa [name] HDQ:[id number] DOB: 10/26/1960 DOA: 09/16/2015  PCP: No PCP Per Patient  Admit date: 09/16/2015 Discharge date: 09/18/2015  Time spent: 20 minutes  Recommendations for Outpatient Follow-up:  1. Follow up with PCP in 1-2 weeks   Discharge Diagnoses:  Principal Problem:   Chest pain Active Problems:   Non-small cell carcinoma of lung, stage 3 (HCC)   Tachycardia   Pain in the chest   Malnutrition of moderate degree   Discharge Condition: Stable  Diet recommendation: Heart healthy as tolerated  Filed Weights   09/16/15 1517 09/16/15 2026  Weight: 99.338 kg (219 lb) 96.571 kg (212 lb 14.4 oz)    History of present illness:  Please review dictated H and P from 12/26 for details. Briefly, 54 y.o. female with history of recently diagnosed stage III non-small cell lung cancer presents to the ER because of chest pain and difficulty swallowing in the setting of concurrent radiation tx.  Hospital Course:  1. Chest pain 1. Trop serially negative 2. 2d echo without wall motion abnormality 3. Seen by Cardiology 4. During follow up exam, patient observed to have heavy "burb" followed by reproduced substernal chest pain 5. Pain is very likely esophageal pain from GERD vs radiation 6. Will continue PPI, Carafate, pepto-bismol  2. Afib/flutter with RVR, converted to NSR 1. Seen by Cardiology 2. TSH normal 3. CHADS 1 4. Cardiology recs for low dose beta blocker to prevent recurrent atrial flutter 3. Dysphagia  1. Likely related to radiation.  2. Continue on Carafate and Protonix.  3. SLP recs to advance diet as tolerated. 4. Anemia probably related to chemotherapy - 1. CBC remained stable 5. Non-small cell lung cancer on chemoradiation  1. Per report pt wants to keep diagnosis from family members 2. To cont per oncology. 6. Moderate Protein Calorie Malnutrition 1. Nutrition consulted  Procedures:  2d  echo  Consultations:  Cardiology  Rad onc  Discharge Exam: Filed Vitals:   09/16/15 2026 09/16/15 2031 09/17/15 0436 09/17/15 1314  BP:  129/78 131/82 117/77  Pulse:  122 147 96  Temp:  98.5 F (36.9 C) 98.3 F (36.8 C) 98 F (36.7 C)  TempSrc:  Oral Oral Oral  Resp:   20 20  Height: '5\' 8"'$  (1.727 m)     Weight: 96.571 kg (212 lb 14.4 oz)     SpO2:  96% 96% 98%    General: Awake, in nad Cardiovascular: regular, s1, s2 Respiratory: normal resp effort, no wheezing  Discharge Instructions     Medication List    STOP taking these medications        sucralfate 1 g tablet  Commonly known as:  CARAFATE  Replaced by:  sucralfate 1 GM/10ML suspension      TAKE these medications        bismuth subsalicylate 119 ER/74YC suspension  Commonly known as:  PEPTO-BISMOL  Take 30 mLs by mouth every 6 (six) hours as needed.     CALCIUM 600 + D 600-200 MG-UNIT Tabs  Generic drug:  Calcium Carb-Cholecalciferol  Take 2 capsules by mouth daily.     chlorpheniramine-HYDROcodone 10-8 MG/5ML Suer  Commonly known as:  TUSSIONEX  Take 5 mLs by mouth every 12 (twelve) hours as needed for cough.     magic mouthwash w/lidocaine Soln  Take 5 mLs by mouth 3 (three) times daily as needed for mouth pain.     metoprolol tartrate 25 MG tablet  Commonly known as:  LOPRESSOR  Take 0.5 tablets (12.5 mg total) by mouth 2 (two) times daily.     multivitamin with minerals Tabs tablet  Take 1 tablet by mouth daily.     oxyCODONE-acetaminophen 5-325 MG tablet  Commonly known as:  PERCOCET/ROXICET  Take 1 tablet by mouth every 6 (six) hours as needed for severe pain.     pantoprazole 40 MG tablet  Commonly known as:  PROTONIX  Take 1 tablet (40 mg total) by mouth 2 (two) times daily.     prochlorperazine 10 MG tablet  Commonly known as:  COMPAZINE  Take 1 tablet (10 mg total) by mouth every 6 (six) hours as needed for nausea or vomiting.     RADIAPLEX EX  Apply topically.      sucralfate 1 GM/10ML suspension  Commonly known as:  CARAFATE  Take 10 mLs (1 g total) by mouth every 6 (six) hours.       No Known Allergies    The results of significant diagnostics from this hospitalization (including imaging, microbiology, ancillary and laboratory) are listed below for reference.    Significant Diagnostic Studies: Dg Chest 2 View  09/16/2015  CLINICAL DATA:  54 year old female with right-sided chest pain and a history of stage III non-small cell carcinoma of the right lung EXAM: CHEST  2 VIEW COMPARISON:  Recent prior PET-CT 08/07/2015 ; prior chest x-ray 08/01/2015 FINDINGS: Cardiac and mediastinal contours are within normal limits. Significantly decreased atelectasis and opacity in the right middle lobe compared to prior imaging. Right hilar fullness is also less conspicuous than previously seen. Stable chronic bronchitic change. Probable bibasilar subsegmental atelectasis. No pneumothorax, pleural effusion or new focal airspace consolidation. No acute osseous abnormality. IMPRESSION: 1. No acute cardiopulmonary disease. 2. Evidence of interval treatment effect with significantly less conspicuous right hilar mass and associated right middle lobe atelectasis compared to imaging from November 2016. Electronically Signed   By: Jacqulynn Cadet M.D.   On: 09/16/2015 16:34   Ct Angio Chest Pe W/cm &/or Wo Cm  09/16/2015  CLINICAL DATA:  54 year old female with chest pain and known lung cancer currently undergoing chemotherapy. EXAM: CT ANGIOGRAPHY CHEST WITH CONTRAST TECHNIQUE: Multidetector CT imaging of the chest was performed using the standard protocol during bolus administration of intravenous contrast. Multiplanar CT image reconstructions and MIPs were obtained to evaluate the vascular anatomy. CONTRAST:  171m OMNIPAQUE IOHEXOL 350 MG/ML SOLN COMPARISON:  Chest x-ray obtained earlier today ; prior PET-CT 08/07/2015 FINDINGS: Mediastinum: Decreasing mediastinal adenopathy.  Index right paratracheal lymph node measures 12 mm in short axis compared to 20 mm previously. The subcarinal lymphadenopathy has also decreased in size at 17 mm in short axis compared to 20 mm previously. The right hilar mass remains a amorphous and difficult to measure discretely. However, the abnormality appears less full and there is improving aeration of the right middle lobe bronchi with a commensurate decrease in right middle lobe atelectasis. Heart/Vascular: Aberrant right subclavian artery. No aortic aneurysm or dissection. Adequate opacification of the pulmonary arteries to the proximal segmental level. No evidence of focal central filling defect to suggest acute pulmonary embolus. The heart is within normal limits for size. No pericardial effusion. Lungs/Pleura: Significantly decreased size of the right perihilar/medial right upper lobe pulmonary mass compared to the prior PET-CT. Although difficult to measure precisely given the a amorphous configuration, the mass measures approximately 3.2 by 3.4 cm compared to 6.1 x 4.4 cm previously. Additionally, there is less atelectasis in the right upper and right middle lobe. Persistent  attenuation of the right middle lobe pulmonary arterial branches. Unchanged past she had ground-glass attenuation opacity in the right lung apex of uncertain clinical significance. No new pulmonary mass or nodule. Dependent atelectasis in both lower lobes. Bones/Soft Tissues: No acute fracture or aggressive appearing lytic or blastic osseous lesion. Upper Abdomen: Stable probable 12 mm cyst in the hepatic dome. Otherwise, visualized upper abdominal organs are unremarkable. Review of the MIP images confirms the above findings. IMPRESSION: 1. Negative for acute pulmonary embolus, pneumonia or other acute cardiopulmonary process. 2. Interval response to therapy with significantly decreased size of medial right upper lobe/right perihilar pulmonary mass and associated right hilar and  mediastinal metastatic adenopathy. 3. Decreasing right upper and middle lobe atelectasis with partial re- opening of the right middle lobe bronchi. 4. Aberrant right subclavian artery again noted. 5. Additional ancillary findings as above without interval change. Electronically Signed   By: Jacqulynn Cadet M.D.   On: 09/16/2015 17:30    Microbiology: No results found for this or any previous visit (from the past 240 hour(s)).   Labs: Basic Metabolic Panel:  Recent Labs Lab 09/16/15 1528 09/17/15 0442  NA 138 140  K 3.8 4.0  CL 101 107  CO2 23 24  GLUCOSE 144* 120*  BUN 13 9  CREATININE 0.92 0.74  CALCIUM 10.0 9.0   Liver Function Tests:  Recent Labs Lab 09/16/15 1529 09/17/15 0442  AST 24 16  ALT 20 16  ALKPHOS 78 70  BILITOT 1.1 1.1  PROT 8.7* 7.1  ALBUMIN 4.3 3.4*   No results for input(s): LIPASE, AMYLASE in the last 168 hours. No results for input(s): AMMONIA in the last 168 hours. CBC:  Recent Labs Lab 09/16/15 1528 09/17/15 0442  WBC 2.1* 1.5*  NEUTROABS  --  0.9*  HGB 11.5* 9.6*  HCT 34.2* 28.9*  MCV 87.0 86.5  PLT 188 144*   Cardiac Enzymes:  Recent Labs Lab 09/16/15 1529 09/16/15 2310 09/17/15 0442 09/17/15 0845  TROPONINI <0.03 <0.03 <0.03 <0.03   BNP: BNP (last 3 results) No results for input(s): BNP in the last 8760 hours.  ProBNP (last 3 results) No results for input(s): PROBNP in the last 8760 hours.  CBG: No results for input(s): GLUCAP in the last 168 hours.     Signed:  Calistro Rauf, Orpah Melter  [facility] 09/17/2015, 3:48 PM

## 2015-09-17 NOTE — Evaluation (Addendum)
Clinical/Bedside Swallow Evaluation Patient Details  Name: Theresa [name] MRN: [id number] Date of Birth: Jul 01, 1961  Today's Date: 09/17/2015 Time: SLP Start Time (ACUTE ONLY): 1025 SLP Stop Time (ACUTE ONLY): 1106 SLP Time Calculation (min) (ACUTE ONLY): 41 min  Past Medical History:  Past Medical History  Diagnosis Date  . Pneumonia   . Cancer ([place])     5 years ago - cervical cancer  . Non-small cell carcinoma of lung, stage 3 ([place]) 08/08/2015  . Encounter for antineoplastic chemotherapy 08/27/2015  . Paroxysmal atrial flutter ([place]) 09/16/2015   Past Surgical History:  Past Surgical History  Procedure Laterality Date  . Wisdom tooth extraction    . Tubal ligation    . Video bronchoscopy with endobronchial ultrasound N/A 08/02/2015    Procedure: VIDEO BRONCHOSCOPY WITH ENDOBRONCHIAL ULTRASOUND;  Surgeon: [name], MD;  [facility] OR;  Service: Thoracic;  Laterality: N/A;   HPI:  54 yo female adm to [facility] with chest pain and tachycardia.  PMH + for Stage IV NSCLC undergoing chemoradiation.  Pt reports she is to have her last treatments in early January.  Pt reports recent onset dysphagia with sensation of lodging in distal esophagus.  Pt reports sensation of a "shift" pointing to proximal esophagus.  She also reports sensing delayed clearance of distal esophagus.     Assessment / Plan / Recommendation Clinical Impression  Pt presents with symptoms consistent with esophageal dysphagia likely due to her chemoradiation treatment.  No indication of oropharyngeal deficits.  Pt without s/s of aspiration with minimal po intake pt would accept (jello, Ensure, soup) and swallow was swift.  Pt indicates burning sensation and sensation of slow clearance at distal region.  Pt does report consuming mashed potatoes and pudding at home without difficulties.  Gustatory changes noted as well with pt reporting metallic taste.  SLP provided pt with verbal/written compensation strategies to  mitigate dysphagia symptoms.   Recommend advance diet as tolerated  - and RD consult to maximize nutrition.    Pt noted to have Sucralfate and Protonix ordered to maximize medicine management for pt's dysphagia symptoms.  Thanks for this consult.     Aspiration Risk  Risk for inadequate nutrition/hydration    Diet Recommendation  (diet as tolerated)   Liquid Administration via: Straw Medication Administration: Via alternative means (attempt with pudding/puree or with liquids) Supervision: Patient able to self feed Compensations: Slow rate;Small sips/bites (intermittent dry swallow)    Other  Recommendations Oral Care Recommendations: Oral care BID Other Recommendations: Clarify dietary restrictions   Follow up Recommendations  None    Frequency and Duration   n/a         Prognosis        Swallow Study   General Date of Onset: 09/17/15 HPI: 54 yo female adm to [facility] with chest pain and tachycardia.  PMH + for Stage IV NSCLC undergoing chemoradiation.  Pt reports she is to have her last treatments in early January.  Pt reports recent onset dysphagia with sensation of lodging in distal esophagus.  Pt reports sensation of a "shift" pointing to proximal esophagus.  She also reports sensing delayed clearance of distal esophagus.   Diet Prior to this Study: Other (Comment) (full liquids) Temperature Spikes Noted: No Respiratory Status: Room air History of Recent Intubation: No Behavior/Cognition: Alert;Cooperative;Pleasant mood Oral Cavity Assessment: Dry Oral Care Completed by SLP: No Oral Cavity - Dentition: Adequate natural dentition Vision: Functional for self-feeding Self-Feeding Abilities: Able to feed self Patient Positioning: Upright  in bed Baseline Vocal Quality: Normal Volitional Cough: Other (Comment) (dnt secondary to chest pain) Volitional Swallow: Able to elicit    Oral/Motor/Sensory Function Overall Oral Motor/Sensory Function: Within functional limits   Ice  Chips Ice chips: Not tested   Thin Liquid Thin Liquid: Impaired Presentation: Straw    Nectar Thick Nectar Thick Liquid: Impaired Presentation: Straw;Self Fed   Honey Thick Honey Thick Liquid: Not tested   Puree Puree: Impaired Presentation: Self Fed;Spoon Other Comments: jello bolus, pt taking very small bites to compensate for her dysphagia   Solid Solid: Not tested       Luanna Salk, [place] [facility] SLP [phone]

## 2015-09-17 NOTE — Consult Note (Signed)
CARDIOLOGY CONSULT NOTE   Patient ID: Theresa [name] MRN: [id number], DOB/AGE: 53-Feb-1962   Admit date: 09/16/2015 Date of Consult: 09/17/2015 Reason for Consult: Chest Pain, Tachycardia   Primary Physician: No PCP Per Patient Primary Cardiologist: New  HPI: Theresa [name] is a 54 y.o. female with past medical history of recently diagnosed Stage 3 NSCLC in November 2016 and no prior cardiac history who presented to [facility] ED on 09/16/2015 for chest palpitations and chest pressure.  The patient reports her symptoms had started earlier that morning. She felt her heart racing and an associated pressure developed later that day which prompted her to seek medical attention. She reports the pressure has been constant since onset and is worse with coughing or deep breathing. She denies any exertional dyspnea, diaphoresis, nausea, vomiting, or dyspnea at rest. Her initial EKG showed sinus tachycardia with rate in 150's. No previous EKG's were available for comparison. A repeat EKG this morning showed probable atrial flutter with a rate in the 150's. Her telemetry has shown atrial flutter with rates in the 130's - 150's.   Cyclic troponin values have been obtained and are negative. TSH is normal. Leukopenia at 1.5. Platelets currently at 144. Creatinine stable at 0.92. CTA checked and negative for an acute pulmonary embolus.  She is still having palpitations and chest pressure at this time, exacerbated by coughing and deep breathing.   Problem List Past Medical History  Diagnosis Date  . Pneumonia   . Cancer ([place])     5 years ago - cervical cancer  . Non-small cell carcinoma of lung, stage 3 ([place]) 08/08/2015  . Encounter for antineoplastic chemotherapy 08/27/2015    Past Surgical History  Procedure Laterality Date  . Wisdom tooth extraction    . Tubal ligation    . Video bronchoscopy with endobronchial ultrasound N/A 08/02/2015    Procedure: VIDEO BRONCHOSCOPY WITH ENDOBRONCHIAL  ULTRASOUND;  Surgeon: [name], MD;  Location: [facility];  Service: Thoracic;  Laterality: N/A;     Allergies No Known Allergies    Inpatient Medications . calcium-vitamin D  2 tablet Oral Daily  . chlorhexidine  15 mL Mouth Rinse BID  . enoxaparin (LOVENOX) injection  40 mg Subcutaneous QHS  . multivitamin with minerals  1 tablet Oral Daily  . pantoprazole (PROTONIX) IV  40 mg Intravenous Q12H  . sucralfate  1 g Oral QID    Family History Family History  Problem Relation Age of Onset  . COPD Mother   . Diabetes type II Mother   . Heart disease Mother   . High Cholesterol Mother   . Lung cancer Father      Social History Social History   Social History  . Marital Status: Single    Spouse Name: N/A  . Number of Children: N/A  . Years of Education: N/A   Occupational History  . Not on file.   Social History Main Topics  . Smoking status: Former Smoker -- 0.30 packs/day for 5 years    Types: Cigarettes    Quit date: 07/31/1990  . Smokeless tobacco: Never Used  . Alcohol Use: No  . Drug Use: No  . Sexual Activity: Not on file   Other Topics Concern  . Not on file   Social History Narrative     Review of Systems General:  No chills, fever, night sweats or weight changes. Positive for decreased appetite. Cardiovascular:  No dyspnea on exertion, edema, orthopnea, paroxysmal nocturnal dyspnea. Positive for  chest pressure and palpitations. Dermatological: No rash, lesions/masses Respiratory: No cough, dyspnea Urologic: No hematuria, dysuria Abdominal:   No nausea, vomiting, diarrhea, bright red blood per rectum, melena, or hematemesis Neurologic:  No visual changes, wkns, changes in mental status. All other systems reviewed and are otherwise negative except as noted above.  Physical Exam Blood pressure 131/82, pulse 147, temperature 98.3 F (36.8 C), temperature source Oral, resp. rate 20, height '5\' 8"'$  (1.727 m), weight 212 lb 14.4 oz (96.571 kg), SpO2  96 %.  General: Pleasant, African American female appearing in NAD Psych: Normal affect. Neuro: Alert and oriented X 3. Moves all extremities spontaneously. HEENT: Normal  Neck: Supple without bruits or JVD. Lungs:  Resp regular and unlabored, CTA without wheezing or rales. Heart: Irregularly irregular, tachycardiac, no s3, s4, or murmurs. Abdomen: Soft, non-tender, non-distended, BS + x 4.  Extremities: No clubbing, cyanosis or edema. DP/PT/Radials 2+ and equal bilaterally.  Labs:  Recent Labs  09/16/15 1529 09/16/15 2310 09/17/15 0442 09/17/15 0845  TROPONINI <0.03 <0.03 <0.03 <0.03   Lab Results  Component Value Date   WBC 1.5* 09/17/2015   HGB 9.6* 09/17/2015   HCT 28.9* 09/17/2015   MCV 86.5 09/17/2015   PLT 144* 09/17/2015    Recent Labs Lab 09/17/15 0442  NA 140  K 4.0  CL 107  CO2 24  BUN 9  CREATININE 0.74  CALCIUM 9.0  PROT 7.1  BILITOT 1.1  ALKPHOS 70  ALT 16  AST 16  GLUCOSE 120*   Radiology/Studies  Dg Chest 2 View: 09/16/2015  CLINICAL DATA:  54 year old female with right-sided chest pain and a history of stage III non-small cell carcinoma of the right lung EXAM: CHEST  2 VIEW COMPARISON:  Recent prior PET-CT 08/07/2015 ; prior chest x-ray 08/01/2015 FINDINGS: Cardiac and mediastinal contours are within normal limits. Significantly decreased atelectasis and opacity in the right middle lobe compared to prior imaging. Right hilar fullness is also less conspicuous than previously seen. Stable chronic bronchitic change. Probable bibasilar subsegmental atelectasis. No pneumothorax, pleural effusion or new focal airspace consolidation. No acute osseous abnormality. IMPRESSION: 1. No acute cardiopulmonary disease. 2. Evidence of interval treatment effect with significantly less conspicuous right hilar mass and associated right middle lobe atelectasis compared to imaging from November 2016. Electronically Signed   By: Jacqulynn Cadet M.D.   On: 09/16/2015 16:34    Ct Angio Chest Pe W/cm &/or Wo Cm: 09/16/2015  CLINICAL DATA:  54 year old female with chest pain and known lung cancer currently undergoing chemotherapy. EXAM: CT ANGIOGRAPHY CHEST WITH CONTRAST TECHNIQUE: Multidetector CT imaging of the chest was performed using the standard protocol during bolus administration of intravenous contrast. Multiplanar CT image reconstructions and MIPs were obtained to evaluate the vascular anatomy. CONTRAST:  144m OMNIPAQUE IOHEXOL 350 MG/ML SOLN COMPARISON:  Chest x-ray obtained earlier today ; prior PET-CT 08/07/2015 FINDINGS: Mediastinum: Decreasing mediastinal adenopathy. Index right paratracheal lymph node measures 12 mm in short axis compared to 20 mm previously. The subcarinal lymphadenopathy has also decreased in size at 17 mm in short axis compared to 20 mm previously. The right hilar mass remains a amorphous and difficult to measure discretely. However, the abnormality appears less full and there is improving aeration of the right middle lobe bronchi with a commensurate decrease in right middle lobe atelectasis. Heart/Vascular: Aberrant right subclavian artery. No aortic aneurysm or dissection. Adequate opacification of the pulmonary arteries to the proximal segmental level. No evidence of focal central filling defect to suggest acute  pulmonary embolus. The heart is within normal limits for size. No pericardial effusion. Lungs/Pleura: Significantly decreased size of the right perihilar/medial right upper lobe pulmonary mass compared to the prior PET-CT. Although difficult to measure precisely given the a amorphous configuration, the mass measures approximately 3.2 by 3.4 cm compared to 6.1 x 4.4 cm previously. Additionally, there is less atelectasis in the right upper and right middle lobe. Persistent attenuation of the right middle lobe pulmonary arterial branches. Unchanged past she had ground-glass attenuation opacity in the right lung apex of uncertain clinical  significance. No new pulmonary mass or nodule. Dependent atelectasis in both lower lobes. Bones/Soft Tissues: No acute fracture or aggressive appearing lytic or blastic osseous lesion. Upper Abdomen: Stable probable 12 mm cyst in the hepatic dome. Otherwise, visualized upper abdominal organs are unremarkable. Review of the MIP images confirms the above findings. IMPRESSION: 1. Negative for acute pulmonary embolus, pneumonia or other acute cardiopulmonary process. 2. Interval response to therapy with significantly decreased size of medial right upper lobe/right perihilar pulmonary mass and associated right hilar and mediastinal metastatic adenopathy. 3. Decreasing right upper and middle lobe atelectasis with partial re- opening of the right middle lobe bronchi. 4. Aberrant right subclavian artery again noted. 5. Additional ancillary findings as above without interval change. Electronically Signed   By: Jacqulynn Cadet M.D.   On: 09/16/2015 17:30    ECG: Atrial Flutter, HR in 150's.   ECHOCARDIOGRAM: Pending   ASSESSMENT AND PLAN  1. New Onset Atrial Flutter - developed new onset palpitations and associated chest pressure on 09/16/2015. - initial EKG showed questionable sinus tach and repeat EKG shows probable atrial flutter. Atrial flutter is demonstrated on telemetry. Will initiate Cardizem drip. Can hopefully switch to PO tomorrow if HR better controlled. If EF is reduced on echo, will need to switch to alternative medication. - Echocardiogram is pending.  This patients CHA2DS2-VASc Score and unadjusted Ischemic Stroke Rate (% per year) is equal to 0.6 % stroke rate/year from a score of 1 (Female). Consider need for anticoagulation in setting of receiving chemotherapy for NSCLC.  2. Stage 3 NSCLC - recently diagnosed in November 2016  - currently undergoing concurrent chemoradiation with weekly carboplatin and paclitaxel  - per Oncology    Signed, Erma Heritage, PA-C 09/17/2015,  10:06 AM Pager: [phone] Agree with above assessment.  She has now converted to normal sinus rhythm spontaneously, before the Cardizem drip was started.Marland Kitchen  Her heart rate is 100 bpm and P waves are seen on telemetry.  We will get a 12-lead EKG also.  Her echocardiogram is in progress.  With her Chadds Vasc score of only 1 for female sex and with her very brief episode of suspected atrial flutter, we will not institute long-term anticoagulation at this time.  She does have pancytopenia related to her lung cancer and chemotherapy.  Okay to consider sending home on low-dose beta blocker to help with sinus tachycardia and to help prevent recurrent atrial flutter.

## 2015-09-17 NOTE — Progress Notes (Signed)
  Echocardiogram 2D Echocardiogram has been performed.  [name] 09/17/2015, 12:12 PM

## 2015-09-17 NOTE — Care Management Note (Signed)
Case Management Note  Patient Details  Name: Theresa [name] MRN: [id number] Date of Birth: Feb 18, 1961  Subjective/Objective:   54 y/o f admitted w/chest pain. From home.                 Action/Plan:d/c plan home.No d/c needs or orders.   Expected Discharge Date:   (unknown)               Expected Discharge Plan:  Home/Self Care  In-House Referral:     Discharge planning Services  CM Consult  Post Acute Care Choice:    Choice offered to:     DME Arranged:    DME Agency:     HH Arranged:    [place] Agency:     Status of Service:  Completed, signed off  Medicare Important Message Given:    Date Medicare IM Given:    Medicare IM give by:    Date Additional Medicare IM Given:    Additional Medicare Important Message give by:     If discussed at [place] of Stay Meetings, dates discussed:    Additional Comments:  Dessa Phi, RN 09/17/2015, 3:47 PM

## 2015-09-18 ENCOUNTER — Ambulatory Visit
Admit: 2015-09-18 | Discharge: 2015-09-18 | Disposition: A | Discharge status: Home / Self Care | Payer: Medicare Other | Attending: Radiation Oncology | Admitting: Radiation Oncology

## 2015-09-18 ENCOUNTER — Ambulatory Visit: Payer: Medicare Other

## 2015-09-18 DIAGNOSIS — D61810 Antineoplastic chemotherapy induced pancytopenia: Secondary | ICD-10-CM | POA: Diagnosis not present

## 2015-09-18 DIAGNOSIS — K208 Other esophagitis: Secondary | ICD-10-CM | POA: Diagnosis not present

## 2015-09-18 DIAGNOSIS — R0789 Other chest pain: Secondary | ICD-10-CM

## 2015-09-18 DIAGNOSIS — C342 Malignant neoplasm of middle lobe, bronchus or lung: Secondary | ICD-10-CM

## 2015-09-18 DIAGNOSIS — C3481 Malignant neoplasm of overlapping sites of right bronchus and lung: Secondary | ICD-10-CM

## 2015-09-18 DIAGNOSIS — R079 Chest pain, unspecified: Secondary | ICD-10-CM | POA: Diagnosis not present

## 2015-09-18 DIAGNOSIS — I483 Typical atrial flutter: Secondary | ICD-10-CM | POA: Diagnosis present

## 2015-09-18 DIAGNOSIS — D630 Anemia in neoplastic disease: Secondary | ICD-10-CM

## 2015-09-18 DIAGNOSIS — T451X5A Adverse effect of antineoplastic and immunosuppressive drugs, initial encounter: Secondary | ICD-10-CM

## 2015-09-18 DIAGNOSIS — R112 Nausea with vomiting, unspecified: Secondary | ICD-10-CM

## 2015-09-18 DIAGNOSIS — K5900 Constipation, unspecified: Secondary | ICD-10-CM | POA: Diagnosis present

## 2015-09-18 DIAGNOSIS — E440 Moderate protein-calorie malnutrition: Secondary | ICD-10-CM | POA: Diagnosis not present

## 2015-09-18 DIAGNOSIS — K5901 Slow transit constipation: Secondary | ICD-10-CM

## 2015-09-18 LAB — BASIC METABOLIC PANEL
Anion gap: 11 (ref 5–15)
BUN: 10 mg/dL (ref 6–20)
CALCIUM: 9.3 mg/dL (ref 8.9–10.3)
CO2: 24 mmol/L (ref 22–32)
CREATININE: 0.76 mg/dL (ref 0.44–1.00)
Chloride: 105 mmol/L (ref 101–111)
GFR calc Af Amer: >60 mL/min (ref >60)
GLUCOSE: 108 mg/dL — AB (ref 65–99)
Potassium: 3.7 mmol/L (ref 3.5–5.1)
Sodium: 140 mmol/L (ref 135–145)

## 2015-09-18 LAB — T3, FREE: T3 FREE: 2.5 pg/mL (ref 2.0–4.4)

## 2015-09-18 MED ORDER — METOPROLOL TARTRATE 25 MG PO TABS: 25.0000 mg | ORAL_TABLET | Freq: Two times a day (BID) | ORAL | Status: DC

## 2015-09-18 MED ORDER — PROMETHAZINE HCL 25 MG/ML IJ SOLN
12.5000 mg | Freq: Once | INTRAMUSCULAR | Status: AC
  Administered 2015-09-18: 12.5 mg via INTRAVENOUS
  Filled 2015-09-18: qty 1

## 2015-09-18 MED ORDER — BISACODYL 10 MG RE SUPP
10.0000 mg | Freq: Once | RECTAL | Status: AC
  Administered 2015-09-18: 10 mg via RECTAL
  Filled 2015-09-18: qty 1

## 2015-09-18 MED ORDER — METOPROLOL TARTRATE 25 MG PO TABS
25.0000 mg | ORAL_TABLET | Freq: Two times a day (BID) | ORAL | Status: DC
  Administered 2015-09-18 – 2015-09-19 (×2): 25 mg via ORAL
  Filled 2015-09-18 (×2): qty 1

## 2015-09-18 NOTE — Progress Notes (Signed)
Patient Name: Theresa [name] Date of Encounter: 09/18/2015  Principal Problem:   Chest pain Active Problems:   Non-small cell carcinoma of lung, stage 3 (HCC)   Tachycardia   Pain in the chest   Malnutrition of moderate degree    Primary Cardiologist: New Patient Profile: 54 y.o. female w/ PMH of recently diagnosed Stage 3 NSCLC in November 2016 and no prior cardiac history who presented to [facility] ED on 09/16/2015 for palpitations and chest pressure. In atrial flutter but converted spontaneously on 09/17/2015.  SUBJECTIVE: Denies any repeat palpitations or chest pressure. Reports having nausea and vomiting throughout the night and this morning following her treatment yesterday.  OBJECTIVE Filed Vitals:   09/17/15 0436 09/17/15 1314 09/17/15 2228 09/18/15 0647  BP: 131/82 117/77 141/82 132/75  Pulse: 147 96 104 102  Temp: 98.3 F (36.8 C) 98 F (36.7 C) 98.3 F (36.8 C) 97.8 F (36.6 C)  TempSrc: Oral Oral Oral Oral  Resp: '20 20 20 20  '$ Height:      Weight:      SpO2: 96% 98% 96% 96%    Intake/Output Summary (Last 24 hours) at 09/18/15 0819 Last data filed at 09/17/15 1900  Gross per 24 hour  Intake    480 ml  Output      0 ml  Net    480 ml   Filed Weights   09/16/15 1517 09/16/15 2026  Weight: 219 lb (99.338 kg) 212 lb 14.4 oz (96.571 kg)    PHYSICAL EXAM General: Well developed, well nourished, female in no acute distress. Head: Normocephalic, atraumatic.  Neck: Supple without bruits, JVD not elevated. Lungs:  Resp regular and unlabored, CTA without wheezing or rales. Heart: RRR, S1, S2, no S3, S4, or murmur; no rub. Abdomen: Soft, non-tender, non-distended with normoactive bowel sounds. No hepatomegaly. No rebound/guarding. No obvious abdominal masses. Extremities: No clubbing, cyanosis, or edema. Distal pedal pulses are 2+ bilaterally. Neuro: Alert and oriented X 3. Moves all extremities spontaneously. Psych: Normal  affect.  LABS: CBC: Recent Labs  09/16/15 1528 09/17/15 0442  WBC 2.1* 1.5*  NEUTROABS  --  0.9*  HGB 11.5* 9.6*  HCT 34.2* 28.9*  MCV 87.0 86.5  PLT 188 875*   Basic Metabolic Panel: Recent Labs  09/17/15 0442 09/18/15 0528  NA 140 140  K 4.0 3.7  CL 107 105  CO2 24 24  GLUCOSE 120* 108*  BUN 9 10  CREATININE 0.74 0.76  CALCIUM 9.0 9.3   Liver Function Tests: Recent Labs  09/16/15 1529 09/17/15 0442  AST 24 16  ALT 20 16  ALKPHOS 78 70  BILITOT 1.1 1.1  PROT 8.7* 7.1  ALBUMIN 4.3 3.4*   Cardiac Enzymes: Recent Labs  09/16/15 2310 09/17/15 0442 09/17/15 0845  TROPONINI <0.03 <0.03 <0.03    Recent Labs  09/16/15 1534  TROPIPOC 0.01   Thyroid Function Tests: Recent Labs  09/17/15 0845  TSH 3.740  T3FREE 2.5    TELE:  NSR with rate in 90's - 110's.      ECG: Sinus tachycardia, HR 104.  ECHO: 09/17/2015 Study Conclusions - Left ventricle: The cavity size was normal. Wall thickness was normal. Systolic function was normal. The estimated ejection fraction was in the range of 55% to 60%. Wall motion was normal; there were no regional wall motion abnormalities. Doppler parameters are consistent with abnormal left ventricular relaxation (grade 1 diastolic dysfunction). - Mitral valve: There was mild regurgitation.  Impressions: - Normal  LV systolic function; grade 1 diastolic dysfunction; mild MR.  Radiology/Studies: Dg Chest 2 View: 09/16/2015  CLINICAL DATA:  54 year old female with right-sided chest pain and a history of stage III non-small cell carcinoma of the right lung EXAM: CHEST  2 VIEW COMPARISON:  Recent prior PET-CT 08/07/2015 ; prior chest x-ray 08/01/2015 FINDINGS: Cardiac and mediastinal contours are within normal limits. Significantly decreased atelectasis and opacity in the right middle lobe compared to prior imaging. Right hilar fullness is also less conspicuous than previously seen. Stable chronic bronchitic  change. Probable bibasilar subsegmental atelectasis. No pneumothorax, pleural effusion or new focal airspace consolidation. No acute osseous abnormality. IMPRESSION: 1. No acute cardiopulmonary disease. 2. Evidence of interval treatment effect with significantly less conspicuous right hilar mass and associated right middle lobe atelectasis compared to imaging from November 2016. Electronically Signed   By: Jacqulynn Cadet M.D.   On: 09/16/2015 16:34   Ct Angio Chest Pe W/cm &/or Wo Cm: 09/16/2015  CLINICAL DATA:  54 year old female with chest pain and known lung cancer currently undergoing chemotherapy. EXAM: CT ANGIOGRAPHY CHEST WITH CONTRAST TECHNIQUE: Multidetector CT imaging of the chest was performed using the standard protocol during bolus administration of intravenous contrast. Multiplanar CT image reconstructions and MIPs were obtained to evaluate the vascular anatomy. CONTRAST:  122m OMNIPAQUE IOHEXOL 350 MG/ML SOLN COMPARISON:  Chest x-ray obtained earlier today ; prior PET-CT 08/07/2015 FINDINGS: Mediastinum: Decreasing mediastinal adenopathy. Index right paratracheal lymph node measures 12 mm in short axis compared to 20 mm previously. The subcarinal lymphadenopathy has also decreased in size at 17 mm in short axis compared to 20 mm previously. The right hilar mass remains a amorphous and difficult to measure discretely. However, the abnormality appears less full and there is improving aeration of the right middle lobe bronchi with a commensurate decrease in right middle lobe atelectasis. Heart/Vascular: Aberrant right subclavian artery. No aortic aneurysm or dissection. Adequate opacification of the pulmonary arteries to the proximal segmental level. No evidence of focal central filling defect to suggest acute pulmonary embolus. The heart is within normal limits for size. No pericardial effusion. Lungs/Pleura: Significantly decreased size of the right perihilar/medial right upper lobe pulmonary  mass compared to the prior PET-CT. Although difficult to measure precisely given the a amorphous configuration, the mass measures approximately 3.2 by 3.4 cm compared to 6.1 x 4.4 cm previously. Additionally, there is less atelectasis in the right upper and right middle lobe. Persistent attenuation of the right middle lobe pulmonary arterial branches. Unchanged past she had ground-glass attenuation opacity in the right lung apex of uncertain clinical significance. No new pulmonary mass or nodule. Dependent atelectasis in both lower lobes. Bones/Soft Tissues: No acute fracture or aggressive appearing lytic or blastic osseous lesion. Upper Abdomen: Stable probable 12 mm cyst in the hepatic dome. Otherwise, visualized upper abdominal organs are unremarkable. Review of the MIP images confirms the above findings. IMPRESSION: 1. Negative for acute pulmonary embolus, pneumonia or other acute cardiopulmonary process. 2. Interval response to therapy with significantly decreased size of medial right upper lobe/right perihilar pulmonary mass and associated right hilar and mediastinal metastatic adenopathy. 3. Decreasing right upper and middle lobe atelectasis with partial re- opening of the right middle lobe bronchi. 4. Aberrant right subclavian artery again noted. 5. Additional ancillary findings as above without interval change. Electronically Signed   By: HJacqulynn CadetM.D.   On: 09/16/2015 17:30     Current Medications:  . chlorhexidine  15 mL Mouth Rinse BID  . feeding  supplement (ENSURE ENLIVE)  237 mL Oral TID BM  . metoprolol tartrate  12.5 mg Oral BID  . multivitamin with minerals  1 tablet Oral Daily  . pantoprazole (PROTONIX) IV  40 mg Intravenous Q12H  . sucralfate  1 g Oral 4 times per day      ASSESSMENT AND PLAN:  1. New Onset Atrial Flutter - developed new onset palpitations and associated chest pressure on 09/16/2015. - initial EKG showed questionable sinus tach and repeat EKG showed  probable atrial flutter. Atrial flutter was also demonstrated on telemetry. Prior to being started on IV Cardizem, she spontaneously converted to NSR. - Echo showed EF of 55-60% with no wall motion abnormalities and Grade 1 DD. - This patients CHA2DS2-VASc Score and unadjusted Ischemic Stroke Rate (% per year) is equal to 0.6 % stroke rate/year from a score of 1 (Female). Due to the small amount of time she was in Atrial Flutter and with her score being low, it was decided not to initiate anticoagulation at this time. - started on Metoprolol 12.'5mg'$  BID to help prevent recurrence. Will increase to '25mg'$  BID to help with her persistent sinus tachycardia.  2. Stage 3 NSCLC - recently diagnosed in November 2016  - currently undergoing concurrent chemoradiation with weekly carboplatin and paclitaxel  - per [place] has been scheduled on 10/09/2015.   Signed, Erma Heritage , PA-C 8:19 AM 09/18/2015 Pager: [phone]

## 2015-09-18 NOTE — Care Management Obs Status (Signed)
[facility] NOTIFICATION   Patient Details  Name: Theresa [name] MRN: [id number] Date of Birth: 1960-10-04   Medicare Observation Status Notification Given:  Yes    MahabirJuliann Pulse, RN 09/18/2015, 1:07 PM

## 2015-09-18 NOTE — Progress Notes (Signed)
Progress Note   Theresa [name] IRS:[id number] DOB: 07/31/1961 DOA: 09/16/2015 PCP: No PCP Per Patient   Brief Narrative:   Theresa [name] is an 54 y.o. female with a PMH of recently diagnosed stage III non-small cell lung cancer currently undergoing active concurrent chemoradiation with weekly carboplatin and and paclitaxel who was admitted 09/16/15 for evaluation of chest pain.  Assessment/Plan:   Principal Problem:   Chest pain/esophagitis/dysphasia - Chest pain is likely from radiation-induced esophagitis given dysphasia and nausea/vomiting. - Troponins negative. Continue Carafate and Protonix. GI cocktail ordered when necessary. - Continue Magic mouthwash with lidocaine as needed for dysphasia. - 2-D echo done 09/17/15, EF 55-60 percent. Grade 1 diastolic dysfunction.  Active Problems:   Chemotherapy associated pancytopenia - ANC 0.9. Monitor closely for the development of signs of infection. Currently afebrile. - Platelet count slightly low, would not expect significant increased risk of bleeding. - Monitor H&H, currently no indication for transfusion.    Constipation - We'll give a Dulcolax suppository today.    Nausea and vomiting - Continue to hydrate and provide antiemetics as needed.    Anemia in neoplastic disease - Hemoglobin drop of 2 g over the past 24 hours likely related to dilutional factors. Monitor.    Non-small cell carcinoma of lung, stage 3 (HCC) - Undergoing active concurrent chemoradiation with weekly carboplatin and paclitaxel. - We'll notify oncologist of the patient's admission.    Tachycardia/atrial flutter - Transient atrial flutter noted, resolved on repeat EKG. - Continue labetalol as needed and scheduled metoprolol.    Malnutrition of moderate degree - Continue ensure supplements.    DVT Prophylaxis - Lovenox ordered.   Family Communication/Anticipated D/C date and plan/Code Status   Family Communication: Family updated in  room. Disposition Plan: Home when nausea, vomiting and pain improved. Anticipated D/C date:   2-3 days depending on resolution of symptoms. Code Status: Full code.   IV Access:    Peripheral IV   Procedures and diagnostic studies:   No results found.   Medical Consultants:    None.  Anti-Infectives:   Anti-infectives    None      Subjective:   Theresa [name] still has chest pain and now has active nausea and vomiting.  She has not moved her bowels in 2 days.  Objective:    Filed Vitals:   09/17/15 0436 09/17/15 1314 09/17/15 2228 09/18/15 0647  BP: 131/82 117/77 141/82 132/75  Pulse: 147 96 104 102  Temp: 98.3 F (36.8 C) 98 F (36.7 C) 98.3 F (36.8 C) 97.8 F (36.6 C)  TempSrc: Oral Oral Oral Oral  Resp: '20 20 20 20  '$ Height:      Weight:      SpO2: 96% 98% 96% 96%    Intake/Output Summary (Last 24 hours) at 09/18/15 0939 Last data filed at 09/17/15 1900  Gross per 24 hour  Intake    480 ml  Output      0 ml  Net    480 ml   Filed Weights   09/16/15 1517 09/16/15 2026  Weight: 99.338 kg (219 lb) 96.571 kg (212 lb 14.4 oz)    Exam: Gen:  Actively vomiting Cardiovascular:  Tachycardic, No M/R/G Respiratory:  Lungs CTAB Gastrointestinal:  Abdomen soft, NT/ND, + BS Extremities:  No C/E/C   Data Reviewed:    Labs: Basic Metabolic Panel:  Recent Labs Lab 09/16/15 1528 09/17/15 0442 09/18/15 0528  NA 138 140 140  K 3.8 4.0 3.7  CL 101 107 105  CO2 '23 24 24  '$ GLUCOSE 144* 120* 108*  BUN '13 9 10  '$ CREATININE 0.92 0.74 0.76  CALCIUM 10.0 9.0 9.3   GFR Estimated Creatinine Clearance: 98.9 mL/min (by C-G formula based on Cr of 0.76). Liver Function Tests:  Recent Labs Lab 09/16/15 1529 09/17/15 0442  AST 24 16  ALT 20 16  ALKPHOS 78 70  BILITOT 1.1 1.1  PROT 8.7* 7.1  ALBUMIN 4.3 3.4*   CBC:  Recent Labs Lab 09/16/15 1528 09/17/15 0442  WBC 2.1* 1.5*  NEUTROABS  --  0.9*  HGB 11.5* 9.6*  HCT 34.2* 28.9*  MCV  87.0 86.5  PLT 188 144*   Cardiac Enzymes:  Recent Labs Lab 09/16/15 1529 09/16/15 2310 09/17/15 0442 09/17/15 0845  TROPONINI <0.03 <0.03 <0.03 <0.03   Thyroid function studies:  Recent Labs  09/17/15 0845  TSH 3.740  T3FREE 2.5   Sepsis Labs:  Recent Labs Lab 09/16/15 1528 09/16/15 1537 09/16/15 1847 09/17/15 0442 09/17/15 0840  WBC 2.1*  --   --  1.5*  --   LATICACIDVEN  --  2.49* 1.08  --  0.9   Microbiology No results found for this or any previous visit (from the past 240 hour(s)).   Medications:   . chlorhexidine  15 mL Mouth Rinse BID  . feeding supplement (ENSURE ENLIVE)  237 mL Oral TID BM  . metoprolol tartrate  25 mg Oral BID  . multivitamin with minerals  1 tablet Oral Daily  . pantoprazole (PROTONIX) IV  40 mg Intravenous Q12H  . sucralfate  1 g Oral 4 times per day   Continuous Infusions:   Time spent: 35 minutes.  The patient is medically complex with multiple co-morbidities and is at high risk for clinical deterioration and requires high complexity decision making.    LOS: 2 days   Soriyah Osberg  [facility] Pager [phone]. If unable to reach me by pager, please call my cell phone at [phone].  *Please refer to [email or web address], password [email or web address] to get updated schedule on who will round on this patient, as hospitalists switch teams weekly. If 7PM-7AM, please contact night-coverage at [email or web address], password [email or web address] for any overnight needs.  09/18/2015, 9:39 AM

## 2015-09-19 ENCOUNTER — Ambulatory Visit
Admission: RE | Admit: 2015-09-19 | Discharge: 2015-09-19 | Disposition: A | Discharge status: Home / Self Care | Payer: Medicare Other | Source: Ambulatory Visit | Attending: Radiation Oncology | Admitting: Radiation Oncology

## 2015-09-19 DIAGNOSIS — C3481 Malignant neoplasm of overlapping sites of right bronchus and lung: Secondary | ICD-10-CM | POA: Diagnosis not present

## 2015-09-19 DIAGNOSIS — C3491 Malignant neoplasm of unspecified part of right bronchus or lung: Secondary | ICD-10-CM | POA: Diagnosis not present

## 2015-09-19 DIAGNOSIS — R079 Chest pain, unspecified: Secondary | ICD-10-CM | POA: Diagnosis not present

## 2015-09-19 DIAGNOSIS — K5901 Slow transit constipation: Secondary | ICD-10-CM | POA: Diagnosis not present

## 2015-09-19 DIAGNOSIS — E440 Moderate protein-calorie malnutrition: Secondary | ICD-10-CM | POA: Diagnosis not present

## 2015-09-19 DIAGNOSIS — D61810 Antineoplastic chemotherapy induced pancytopenia: Secondary | ICD-10-CM | POA: Diagnosis not present

## 2015-09-19 DIAGNOSIS — D630 Anemia in neoplastic disease: Secondary | ICD-10-CM | POA: Diagnosis not present

## 2015-09-19 DIAGNOSIS — I483 Typical atrial flutter: Secondary | ICD-10-CM | POA: Diagnosis not present

## 2015-09-19 DIAGNOSIS — K208 Other esophagitis: Secondary | ICD-10-CM | POA: Diagnosis not present

## 2015-09-19 LAB — BASIC METABOLIC PANEL
ANION GAP: 10 (ref 5–15)
BUN: 9 mg/dL (ref 6–20)
CALCIUM: 9.5 mg/dL (ref 8.9–10.3)
CO2: 27 mmol/L (ref 22–32)
Chloride: 106 mmol/L (ref 101–111)
Creatinine, Ser: 0.91 mg/dL (ref 0.44–1.00)
Glucose, Bld: 122 mg/dL — ABNORMAL HIGH (ref 65–99)
Potassium: 3.8 mmol/L (ref 3.5–5.1)
Sodium: 143 mmol/L (ref 135–145)

## 2015-09-19 LAB — CBC WITH DIFFERENTIAL/PLATELET
BASOS ABS: 0 10*3/uL (ref 0.0–0.1)
Basophils Relative: 0 %
Eosinophils Absolute: 0 10*3/uL (ref 0.0–0.7)
Eosinophils Relative: 1 %
HEMATOCRIT: 29.3 % — AB (ref 36.0–46.0)
Hemoglobin: 9.9 g/dL — ABNORMAL LOW (ref 12.0–15.0)
LYMPHS ABS: 0.3 10*3/uL — AB (ref 0.7–4.0)
Lymphocytes Relative: 28 %
MCH: 29.4 pg (ref 26.0–34.0)
MCHC: 33.8 g/dL (ref 30.0–36.0)
MCV: 86.9 fL (ref 78.0–100.0)
MONO ABS: 0.1 10*3/uL (ref 0.1–1.0)
MONOS PCT: 10 %
NEUTROS PCT: 61 %
Neutro Abs: 0.8 10*3/uL — ABNORMAL LOW (ref 1.7–7.7)
PLATELETS: 143 10*3/uL — AB (ref 150–400)
RBC: 3.37 MIL/uL — AB (ref 3.87–5.11)
RDW: 18 % — AB (ref 11.5–15.5)
WBC: 1.2 10*3/uL — AB (ref 4.0–10.5)

## 2015-09-19 NOTE — Discharge Instructions (Signed)
Nonspecific Chest Pain  °Chest pain can be caused by many different conditions. There is always a chance that your pain could be related to something serious, such as a heart attack or a blood clot in your lungs. Chest pain can also be caused by conditions that are not life-threatening. If you have chest pain, it is very important to follow up with your health care provider. °CAUSES  °Chest pain can be caused by: °· Heartburn. °· Pneumonia or bronchitis. °· Anxiety or stress. °· Inflammation around your heart (pericarditis) or lung (pleuritis or pleurisy). °· A blood clot in your lung. °· A collapsed lung (pneumothorax). It can develop suddenly on its own (spontaneous pneumothorax) or from trauma to the chest. °· Shingles infection (varicella-zoster virus). °· Heart attack. °· Damage to the bones, muscles, and cartilage that make up your chest wall. This can include: °¨ Bruised bones due to injury. °¨ Strained muscles or cartilage due to frequent or repeated coughing or overwork. °¨ Fracture to one or more ribs. °¨ Sore cartilage due to inflammation (costochondritis). °RISK FACTORS  °Risk factors for chest pain may include: °· Activities that increase your risk for trauma or injury to your chest. °· Respiratory infections or conditions that cause frequent coughing. °· Medical conditions or overeating that can cause heartburn. °· Heart disease or family history of heart disease. °· Conditions or health behaviors that increase your risk of developing a blood clot. °· Having had chicken pox (varicella zoster). °SIGNS AND SYMPTOMS °Chest pain can feel like: °· Burning or tingling on the surface of your chest or deep in your chest. °· Crushing, pressure, aching, or squeezing pain. °· Dull or sharp pain that is worse when you move, cough, or take a deep breath. °· Pain that is also felt in your back, neck, shoulder, or arm, or pain that spreads to any of these areas. °Your chest pain may come and go, or it may stay  constant. °DIAGNOSIS °Lab tests or other studies may be needed to find the cause of your pain. Your health care provider may have you take a test called an ambulatory ECG (electrocardiogram). An ECG records your heartbeat patterns at the time the test is performed. You may also have other tests, such as: °· Transthoracic echocardiogram (TTE). During echocardiography, sound waves are used to create a picture of all of the heart structures and to look at how blood flows through your heart. °· Transesophageal echocardiogram (TEE). This is a more advanced imaging test that obtains images from inside your body. It allows your health care provider to see your heart in finer detail. °· Cardiac monitoring. This allows your health care provider to monitor your heart rate and rhythm in real time. °· Holter monitor. This is a portable device that records your heartbeat and can help to diagnose abnormal heartbeats. It allows your health care provider to track your heart activity for several days, if needed. °· Stress tests. These can be done through exercise or by taking medicine that makes your heart beat more quickly. °· Blood tests. °· Imaging tests. °TREATMENT  °Your treatment depends on what is causing your chest pain. Treatment may include: °· Medicines. These may include: °¨ Acid blockers for heartburn. °¨ Anti-inflammatory medicine. °¨ Pain medicine for inflammatory conditions. °¨ Antibiotic medicine, if an infection is present. °¨ Medicines to dissolve blood clots. °¨ Medicines to treat coronary artery disease. °· Supportive care for conditions that do not require medicines. This may include: °¨ Resting. °¨ Applying heat   or cold packs to injured areas. °¨ Limiting activities until pain decreases. °HOME CARE INSTRUCTIONS °· If you were prescribed an antibiotic medicine, finish it all even if you start to feel better. °· Avoid any activities that bring on chest pain. °· Do not use any tobacco products, including  cigarettes, chewing tobacco, or electronic cigarettes. If you need help quitting, ask your health care provider. °· Do not drink alcohol. °· Take medicines only as directed by your health care provider. °· Keep all follow-up visits as directed by your health care provider. This is important. This includes any further testing if your chest pain does not go away. °· If heartburn is the cause for your chest pain, you may be told to keep your head raised (elevated) while sleeping. This reduces the chance that acid will go from your stomach into your esophagus. °· Make lifestyle changes as directed by your health care provider. These may include: °¨ Getting regular exercise. Ask your health care provider to suggest some activities that are safe for you. °¨ Eating a heart-healthy diet. A registered dietitian can help you to learn healthy eating options. °¨ Maintaining a healthy weight. °¨ Managing diabetes, if necessary. °¨ Reducing stress. °SEEK MEDICAL CARE IF: °· Your chest pain does not go away after treatment. °· You have a rash with blisters on your chest. °· You have a fever. °SEEK IMMEDIATE MEDICAL CARE IF:  °· Your chest pain is worse. °· You have an increasing cough, or you cough up blood. °· You have severe abdominal pain. °· You have severe weakness. °· You faint. °· You have chills. °· You have sudden, unexplained chest discomfort. °· You have sudden, unexplained discomfort in your arms, back, neck, or jaw. °· You have shortness of breath at any time. °· You suddenly start to sweat, or your skin gets clammy. °· You feel nauseous or you vomit. °· You suddenly feel light-headed or dizzy. °· Your heart begins to beat quickly, or it feels like it is skipping beats. °These symptoms may represent a serious problem that is an emergency. Do not wait to see if the symptoms will go away. Get medical help right away. Call your local emergency services (911 in the [place] Do not drive yourself to the hospital. °  °This  information is not intended to replace advice given to you by your health care provider. Make sure you discuss any questions you have with your health care provider. °  °Document Released: 06/17/2005 Document Revised: 09/28/2014 Document Reviewed: 04/13/2014 °[employer]vier Interactive Patient Education ©2016 [employer] ° °

## 2015-09-19 NOTE — Discharge Summary (Signed)
Physician Discharge Summary  Theresa [name] STM:[id number] DOB: [birth year] DOA: 09/16/2015  PCP: No PCP Per Patient  Admit date: 09/16/2015 Discharge date: 09/19/2015   Recommendations for Outpatient Follow-Up:   1. Will F/U with oncologist and cardiologist post discharge.  Advised to get a PCP.   Discharge Diagnosis:   Principal Problem:    Chest pain Active Problems:    Non-small cell carcinoma of lung, stage 3 (HCC)    Tachycardia    Pain in the chest    Malnutrition of moderate degree    Typical atrial flutter (HCC)    Anemia in neoplastic disease    Antineoplastic chemotherapy induced pancytopenia (HCC)    Constipation    Nausea and vomiting    Malignant neoplasm of overlapping sites of right lung [facility])   Discharge disposition:  Home.    Discharge Condition: Improved.  Diet recommendation: Regular.   History of Present Illness:   Theresa [name] is an 54 y.o. female with a PMH of recently diagnosed stage III non-small cell lung cancer currently undergoing active concurrent chemoradiation with weekly carboplatin and and paclitaxel who was admitted 09/16/15 for evaluation of chest pain.  Hospital Course by Problem:   Principal Problem:  Chest pain/esophagitis/dysphasia - Chest pain is likely from radiation-induced esophagitis given dysphasia and nausea/vomiting. - Troponins negative. Continue Carafate and Protonix. GI cocktail ordered when necessary. - Continue Magic mouthwash with lidocaine as needed for dysphasia. - 2-D echo done 09/17/15, EF 55-60 percent. Grade 1 diastolic dysfunction.  Active Problems:  Chemotherapy associated pancytopenia - ANC 0.8. Monitor closely for the development of signs of infection. Currently afebrile. - Platelet count slightly low, would not expect significant increased risk of bleeding. - Monitor H&H, currently no indication for transfusion.   Constipation - Bowels moved after Dulcolax suppository.    Nausea and vomiting - Hydrated & provided antiemetics as needed. Resolved at D/C.   Anemia in neoplastic disease - Hemoglobin drop of 2 g since admission, currently stable.   Non-small cell carcinoma of lung, stage 3 (HCC) - Undergoing active concurrent chemoradiation with weekly carboplatin and paclitaxel. - Oncologist notified of the patient's admission.   Tachycardia/atrial flutter - Transient atrial flutter noted, resolved on repeat EKG. TSH OK. - Continue  metoprolol. - Has outpatient F/U with cardiology.   Malnutrition of moderate degree - Continue ensure supplements.  Medical Consultants:    Darlin Coco, MD: Cardiology   Discharge Exam:   Filed Vitals:   09/18/15 2128 09/19/15 0637  BP: 119/73 137/84  Pulse: 102 88  Temp: 98.6 F (37 C) 98.5 F (36.9 C)  Resp: 20 20   Filed Vitals:   09/18/15 0647 09/18/15 1350 09/18/15 2128 09/19/15 0637  BP: 132/75 142/84 119/73 137/84  Pulse: 102 98 102 88  Temp: 97.8 F (36.6 C) 98.2 F (36.8 C) 98.6 F (37 C) 98.5 F (36.9 C)  TempSrc: Oral Oral Oral Oral  Resp: '20 20 20 20  '$ Height:      Weight:      SpO2: 96% 97% 95% 96%    Gen:  NAD Cardiovascular:  RRR, No M/R/G Respiratory: Lungs CTAB Gastrointestinal: Abdomen soft, NT/ND with normal active bowel sounds. Extremities: No C/E/C   The results of significant diagnostics from this hospitalization (including imaging, microbiology, ancillary and laboratory) are listed below for reference.     Procedures and Diagnostic Studies:   No results found.   Labs:   Basic Metabolic Panel:  Recent Labs Lab 09/16/15 1528 09/17/15 0442  09/18/15 0528 09/19/15 0448  NA 138 140 140 143  K 3.8 4.0 3.7 3.8  CL 101 107 105 106  CO2 '23 24 24 27  '$ GLUCOSE 144* 120* 108* 122*  BUN '13 9 10 9  '$ CREATININE 0.92 0.74 0.76 0.91  CALCIUM 10.0 9.0 9.3 9.5   GFR Estimated Creatinine Clearance: 86.9 mL/min (by C-G formula based on Cr of 0.91). Liver Function  Tests:  Recent Labs Lab 09/16/15 1529 09/17/15 0442  AST 24 16  ALT 20 16  ALKPHOS 78 70  BILITOT 1.1 1.1  PROT 8.7* 7.1  ALBUMIN 4.3 3.4*   CBC:  Recent Labs Lab 09/16/15 1528 09/17/15 0442 09/19/15 0448  WBC 2.1* 1.5* 1.2*  NEUTROABS  --  0.9* 0.8*  HGB 11.5* 9.6* 9.9*  HCT 34.2* 28.9* 29.3*  MCV 87.0 86.5 86.9  PLT 188 144* 143*   Cardiac Enzymes:  Recent Labs Lab 09/16/15 1529 09/16/15 2310 09/17/15 0442 09/17/15 0845  TROPONINI <0.03 <0.03 <0.03 <0.03   Thyroid function studies  Recent Labs  09/17/15 0845  TSH 3.740  T3FREE 2.5    Discharge Instructions:   Discharge Instructions    Call MD for:  extreme fatigue    Complete by:  As directed      Call MD for:  persistant nausea and vomiting    Complete by:  As directed      Call MD for:  temperature >100.4    Complete by:  As directed      Diet general    Complete by:  As directed      Increase activity slowly    Complete by:  As directed             Medication List    STOP taking these medications        sucralfate 1 g tablet  Commonly known as:  CARAFATE  Replaced by:  sucralfate 1 GM/10ML suspension      TAKE these medications        bismuth subsalicylate 810 FB/51WC suspension  Commonly known as:  PEPTO-BISMOL  Take 30 mLs by mouth every 6 (six) hours as needed.     CALCIUM 600 + D 600-200 MG-UNIT Tabs  Generic drug:  Calcium Carb-Cholecalciferol  Take 2 capsules by mouth daily.     chlorpheniramine-HYDROcodone 10-8 MG/5ML Suer  Commonly known as:  TUSSIONEX  Take 5 mLs by mouth every 12 (twelve) hours as needed for cough.     magic mouthwash w/lidocaine Soln  Take 5 mLs by mouth 3 (three) times daily as needed for mouth pain.     metoprolol tartrate 25 MG tablet  Commonly known as:  LOPRESSOR  Take 1 tablet (25 mg total) by mouth 2 (two) times daily.     multivitamin with minerals Tabs tablet  Take 1 tablet by mouth daily.     oxyCODONE-acetaminophen 5-325 MG  tablet  Commonly known as:  PERCOCET/ROXICET  Take 1 tablet by mouth every 6 (six) hours as needed for severe pain.     pantoprazole 40 MG tablet  Commonly known as:  PROTONIX  Take 1 tablet (40 mg total) by mouth 2 (two) times daily.     prochlorperazine 10 MG tablet  Commonly known as:  COMPAZINE  Take 1 tablet (10 mg total) by mouth every 6 (six) hours as needed for nausea or vomiting.     RADIAPLEX EX  Apply topically.     sucralfate 1 GM/10ML suspension  Commonly known as:  CARAFATE  Take 10 mLs (1 g total) by mouth every 6 (six) hours.           Follow-up Information    Follow up with Richardson Dopp, PA-C On 10/09/2015.   Specialties:  Physician Assistant, Radiology, Interventional Cardiology   Why:  Cardiology Hospital Follow-Up on 10/09/2015 at 11:10AM.   Contact information:   [address] [place] [address] [phone]       Follow up with Eilleen Kempf., MD.   Specialty:  Oncology   Why:  At your scheduled appointment times.   Contact information:   [place] [address] [phone]        Time coordinating discharge: 35 minutes.  Signed:  Tagen Milby  Pager [phone] [facility] 09/19/2015, 10:16 AM

## 2015-09-19 NOTE — Progress Notes (Signed)
Pt DC instructions reviewed with the pt and her mother at the bedside. All questions and concerns were addressed, prescriptions were given and reviewed, VSS, skin  Intact, no c/o pain, tolerating diet. All personal belongings sent with pt. Pt to keep current follow up appointment with her oncology doctor. DC home with family.

## 2015-09-20 ENCOUNTER — Ambulatory Visit
Admit: 2015-09-20 | Discharge: 2015-09-20 | Disposition: A | Discharge status: Home / Self Care | Payer: Medicare Other | Attending: Radiation Oncology | Admitting: Radiation Oncology

## 2015-09-20 ENCOUNTER — Encounter: Payer: Self-pay | Admitting: Radiation Oncology

## 2015-09-20 ENCOUNTER — Ambulatory Visit
Admission: RE | Admit: 2015-09-20 | Discharge: 2015-09-20 | Disposition: A | Discharge status: Home / Self Care | Payer: Medicare Other | Source: Ambulatory Visit | Attending: Radiation Oncology | Admitting: Radiation Oncology

## 2015-09-20 VITALS — BP 126/82 | HR 108 | Temp 97.8°F | Resp 16 | Wt 209.9 lb

## 2015-09-20 DIAGNOSIS — Z510 Encounter for antineoplastic radiation therapy: Secondary | ICD-10-CM | POA: Diagnosis not present

## 2015-09-20 DIAGNOSIS — C3491 Malignant neoplasm of unspecified part of right bronchus or lung: Secondary | ICD-10-CM | POA: Diagnosis not present

## 2015-09-20 DIAGNOSIS — C342 Malignant neoplasm of middle lobe, bronchus or lung: Secondary | ICD-10-CM

## 2015-09-20 MED ORDER — SUCRALFATE 1 G PO TABS: 1.0000 g | ORAL_TABLET | Freq: Three times a day (TID) | ORAL | Status: DC

## 2015-09-20 NOTE — Progress Notes (Signed)
Vitals stable. Three pound weight loss noted. Reports severe pain in her throat when she tries to swallow. Patient tearful today while discussing pain. Reports she is unable to afford Carafate. Patient was discharged from the hospital recently following a episode of atrial flutter. She has been prescribed metoprolol to manage flutter.  Reports an occasional dry cough continues. Denies skin changes within treatment field. Reports using radiaplex bid as directed. Denies SOB. Reports fatigue.   BP 126/82 mmHg  Pulse 108  Temp(Src) 97.8 F (36.6 C) (Oral)  Resp 16  Wt 209 lb 14.4 oz (95.21 kg)  SpO2 96% Wt Readings from Last 3 Encounters:  09/20/15 209 lb 14.4 oz (95.21 kg)  09/16/15 212 lb 14.4 oz (96.571 kg)  09/13/15 220 lb 1.6 oz (99.837 kg)

## 2015-09-20 NOTE — Progress Notes (Signed)
  Radiation Oncology         [phone]   Name: Theresa [name] MRN: [id number]   Date: 09/20/2015  DOB: 07-Mar-1961     Weekly Radiation Therapy Management    ICD-9-CM ICD-10-CM   1. Non-small cell carcinoma of lung, stage 3 (HCC) 162.4 C34.2 sucralfate (CARAFATE) 1 g tablet    Current Dose: 46 Gy  Planned Dose:  66 Gy  Narrative The patient presents for routine under treatment assessment.  Vitals stable. Three pound weight loss noted. Reports severe pain in her throat when she tries to swallow. Patient tearful today while discussing pain. Reports she is unable to afford Carafate. Patient was discharged from the hospital recently following a episode of atrial flutter. She has been prescribed metoprolol to manage flutter. Reports an occasional dry cough continues. Denies skin changes within treatment field. Reports using radiaplex bid as directed. Denies SOB. Reports fatigue. She states that Oxycodone just makes her tired and doesn't help with the pain much.   The patient is without complaint. Set-up films were reviewed. The chart was checked.  Physical Findings  weight is 209 lb 14.4 oz (95.21 kg). Her oral temperature is 97.8 F (36.6 C). Her blood pressure is 126/82 and her pulse is 108. Her respiration is 16 and oxygen saturation is 96%. . Weight essentially stable.  No significant changes.  Impression The patient is tolerating radiation.  Plan Continue treatment as planned. I prescribed her Carafate 1 mg tablets to help with her discomfort since the liquid Carafate was more expensive. II will wait to prescribe her other medication to see if the Carafate works.         Sheral Apley Tammi Klippel, M.D.  This document serves as a record of services personally performed by Tyler Pita, MD. It was created on his behalf by Lendon Collar, a trained medical scribe. The creation of this record is based on the scribe's personal observations and the provider's statements to them. This  document has been checked and approved by the attending provider.

## 2015-09-24 ENCOUNTER — Ambulatory Visit: Payer: Medicare Other

## 2015-09-24 ENCOUNTER — Other Ambulatory Visit (HOSPITAL_BASED_OUTPATIENT_CLINIC_OR_DEPARTMENT_OTHER): Payer: Medicare Other

## 2015-09-24 ENCOUNTER — Ambulatory Visit
Admission: RE | Admit: 2015-09-24 | Discharge: 2015-09-24 | Disposition: A | Discharge status: Home / Self Care | Payer: Medicare Other | Source: Ambulatory Visit | Attending: Radiation Oncology | Admitting: Radiation Oncology

## 2015-09-24 DIAGNOSIS — Z510 Encounter for antineoplastic radiation therapy: Secondary | ICD-10-CM | POA: Diagnosis not present

## 2015-09-24 DIAGNOSIS — C3491 Malignant neoplasm of unspecified part of right bronchus or lung: Secondary | ICD-10-CM | POA: Diagnosis present

## 2015-09-24 LAB — CBC WITH DIFFERENTIAL/PLATELET
BASO%: 0.6 % (ref 0.0–2.0)
Basophils Absolute: 0 10*3/uL (ref 0.0–0.1)
EOS%: 0.6 % (ref 0.0–7.0)
Eosinophils Absolute: 0 10*3/uL (ref 0.0–0.5)
HEMATOCRIT: 32.5 % — AB (ref 34.8–46.6)
HGB: 10.6 g/dL — ABNORMAL LOW (ref 11.6–15.9)
LYMPH#: 0.2 10*3/uL — AB (ref 0.9–3.3)
LYMPH%: 15.1 % (ref 14.0–49.7)
MCH: 29.1 pg (ref 25.1–34.0)
MCHC: 32.6 g/dL (ref 31.5–36.0)
MCV: 89.2 fL (ref 79.5–101.0)
MONO#: 0.2 10*3/uL (ref 0.1–0.9)
MONO%: 11 % (ref 0.0–14.0)
NEUT%: 72.7 % (ref 38.4–76.8)
NEUTROS ABS: 1.1 10*3/uL — AB (ref 1.5–6.5)
PLATELETS: 177 10*3/uL (ref 145–400)
RBC: 3.64 10*6/uL — AB (ref 3.70–5.45)
RDW: 19.4 % — AB (ref 11.2–14.5)
WBC: 1.5 10*3/uL — AB (ref 3.9–10.3)

## 2015-09-24 LAB — COMPREHENSIVE METABOLIC PANEL
ALBUMIN: 3.7 g/dL (ref 3.5–5.0)
ALK PHOS: 87 U/L (ref 40–150)
ALT: 17 U/L (ref 0–55)
ANION GAP: 10 meq/L (ref 3–11)
AST: 19 U/L (ref 5–34)
BILIRUBIN TOTAL: 0.87 mg/dL (ref 0.20–1.20)
BUN: 8.4 mg/dL (ref 7.0–26.0)
CALCIUM: 9.7 mg/dL (ref 8.4–10.4)
CO2: 22 meq/L (ref 22–29)
CREATININE: 0.9 mg/dL (ref 0.6–1.1)
Chloride: 106 mEq/L (ref 98–109)
EGFR: 78 mL/min/{1.73_m2} — AB (ref >90)
Glucose: 130 mg/dl (ref 70–140)
Potassium: 3.3 mEq/L — ABNORMAL LOW (ref 3.5–5.1)
Sodium: 138 mEq/L (ref 136–145)
TOTAL PROTEIN: 7.9 g/dL (ref 6.4–8.3)

## 2015-09-24 NOTE — Progress Notes (Signed)
Hold treatment today because of low counts, per Dr. Julien Nordmann.  Patient is aware.

## 2015-09-25 ENCOUNTER — Ambulatory Visit
Admission: RE | Admit: 2015-09-25 | Discharge: 2015-09-25 | Disposition: A | Discharge status: Home / Self Care | Payer: Medicare Other | Source: Ambulatory Visit | Attending: Radiation Oncology | Admitting: Radiation Oncology

## 2015-09-25 DIAGNOSIS — C3491 Malignant neoplasm of unspecified part of right bronchus or lung: Secondary | ICD-10-CM | POA: Diagnosis not present

## 2015-09-25 DIAGNOSIS — Z510 Encounter for antineoplastic radiation therapy: Secondary | ICD-10-CM | POA: Diagnosis not present

## 2015-09-26 ENCOUNTER — Ambulatory Visit
Admission: RE | Admit: 2015-09-26 | Discharge: 2015-09-26 | Disposition: A | Discharge status: Home / Self Care | Payer: Medicare Other | Source: Ambulatory Visit | Attending: Radiation Oncology | Admitting: Radiation Oncology

## 2015-09-26 DIAGNOSIS — Z510 Encounter for antineoplastic radiation therapy: Secondary | ICD-10-CM | POA: Diagnosis not present

## 2015-09-26 DIAGNOSIS — C3491 Malignant neoplasm of unspecified part of right bronchus or lung: Secondary | ICD-10-CM | POA: Diagnosis not present

## 2015-09-27 ENCOUNTER — Encounter: Payer: Self-pay | Admitting: Radiation Oncology

## 2015-09-27 ENCOUNTER — Ambulatory Visit
Admission: RE | Admit: 2015-09-27 | Discharge: 2015-09-27 | Disposition: A | Discharge status: Home / Self Care | Payer: Medicare Other | Source: Ambulatory Visit | Attending: Radiation Oncology | Admitting: Radiation Oncology

## 2015-09-27 VITALS — BP 112/67 | HR 101 | Resp 16 | Wt 214.2 lb

## 2015-09-27 DIAGNOSIS — C3491 Malignant neoplasm of unspecified part of right bronchus or lung: Secondary | ICD-10-CM | POA: Diagnosis not present

## 2015-09-27 DIAGNOSIS — C342 Malignant neoplasm of middle lobe, bronchus or lung: Secondary | ICD-10-CM

## 2015-09-27 DIAGNOSIS — Z510 Encounter for antineoplastic radiation therapy: Secondary | ICD-10-CM | POA: Diagnosis not present

## 2015-09-27 NOTE — Progress Notes (Signed)
  Radiation Oncology         [phone]   Name: Theresa [name] MRN: [id number]   Date: 09/27/2015  DOB: [birth year]     Weekly Radiation Therapy Management    ICD-9-CM ICD-10-CM   1. Non-small cell carcinoma of lung, stage 3 (HCC) 162.4 C34.2     Current Dose: 54 Gy  Planned Dose:  66 Gy  Narrative The patient presents for routine under treatment assessment.  Weight and vitals stable. Reports feeling much better today. States she has put on 5 lbs and has been eating well as well as drinking more water. Reports pain in her throat associated with swallowing has resolved. Reports faint difficulty swallowing but, confirms swallowing overall is much improved. Reports chest pain has resolved. Reports she took carafate once but, it didn't seem to help so she stopped. Reports an occasional dry cough continues. Denies skin changes within treatment field. Reports using radiaplex bid. Denies SOB. Reports fatigue. She was unable to receive her most recent scheduled round of chemotherapy due to low blood counts.  The patient is without complaint. Set-up films were reviewed. The chart was checked.  Physical Findings  weight is 214 lb 3.2 oz (97.16 kg). Her blood pressure is 112/67 and her pulse is 101. Her respiration is 16 and oxygen saturation is 100%. . Weight essentially stable.  No significant changes.  Impression The patient is tolerating radiation.  Plan Continue treatment as planned.          Sheral Apley Tammi Klippel, M.D.  This document serves as a record of services personally performed by Tyler Pita, MD. It was created on his behalf by Arlyce Harman, a trained medical scribe. The creation of this record is based on the scribe's personal observations and the provider's statements to them. This document has been checked and approved by the attending provider.

## 2015-09-27 NOTE — Progress Notes (Signed)
Weight and vitals stable. Reports pain in her throat associated with swallowing has resolved. Reports faint difficulty swallowing but, confirms swallowing overall is much improved. Reports chest pain has resolved.  Reports she took carafate once but, it didn't seem to help so she stopped. Reports an occasional dry cough continues. Denies skin changes within treatment field. Reports using radiaplex bid. Denies SOB. Reports fatigue.  BP 112/67 mmHg  Pulse 101  Resp 16  Wt 214 lb 3.2 oz (97.16 kg)  SpO2 100% Wt Readings from Last 3 Encounters:  09/27/15 214 lb 3.2 oz (97.16 kg)  09/20/15 209 lb 14.4 oz (95.21 kg)  09/16/15 212 lb 14.4 oz (96.571 kg)

## 2015-09-30 ENCOUNTER — Encounter: Payer: Self-pay | Admitting: Internal Medicine

## 2015-09-30 ENCOUNTER — Ambulatory Visit
Admission: RE | Admit: 2015-09-30 | Discharge: 2015-09-30 | Disposition: A | Discharge status: Home / Self Care | Payer: Medicare Other | Source: Ambulatory Visit | Attending: Radiation Oncology | Admitting: Radiation Oncology

## 2015-09-30 ENCOUNTER — Ambulatory Visit (HOSPITAL_BASED_OUTPATIENT_CLINIC_OR_DEPARTMENT_OTHER): Payer: Medicare Other | Admitting: Internal Medicine

## 2015-09-30 ENCOUNTER — Telehealth: Payer: Self-pay | Admitting: Internal Medicine

## 2015-09-30 ENCOUNTER — Ambulatory Visit: Payer: Medicare Other

## 2015-09-30 ENCOUNTER — Other Ambulatory Visit: Payer: Self-pay | Admitting: Internal Medicine

## 2015-09-30 ENCOUNTER — Other Ambulatory Visit (HOSPITAL_BASED_OUTPATIENT_CLINIC_OR_DEPARTMENT_OTHER): Payer: Medicare Other

## 2015-09-30 VITALS — BP 110/76 | HR 106 | Temp 97.8°F | Resp 17 | Ht 68.0 in | Wt 213.4 lb

## 2015-09-30 DIAGNOSIS — C3401 Malignant neoplasm of right main bronchus: Secondary | ICD-10-CM

## 2015-09-30 DIAGNOSIS — C3491 Malignant neoplasm of unspecified part of right bronchus or lung: Secondary | ICD-10-CM | POA: Diagnosis not present

## 2015-09-30 DIAGNOSIS — C342 Malignant neoplasm of middle lobe, bronchus or lung: Secondary | ICD-10-CM

## 2015-09-30 DIAGNOSIS — R000 Tachycardia, unspecified: Secondary | ICD-10-CM

## 2015-09-30 DIAGNOSIS — E876 Hypokalemia: Secondary | ICD-10-CM

## 2015-09-30 DIAGNOSIS — D701 Agranulocytosis secondary to cancer chemotherapy: Secondary | ICD-10-CM

## 2015-09-30 DIAGNOSIS — T451X5A Adverse effect of antineoplastic and immunosuppressive drugs, initial encounter: Principal | ICD-10-CM

## 2015-09-30 DIAGNOSIS — Z510 Encounter for antineoplastic radiation therapy: Secondary | ICD-10-CM | POA: Diagnosis not present

## 2015-09-30 HISTORY — DX: Hypokalemia: E87.6

## 2015-09-30 LAB — CBC WITH DIFFERENTIAL/PLATELET
BASO%: 0.5 % (ref 0.0–2.0)
Basophils Absolute: 0 10*3/uL (ref 0.0–0.1)
EOS ABS: 0 10*3/uL (ref 0.0–0.5)
EOS%: 0.2 % (ref 0.0–7.0)
HCT: 32.6 % — ABNORMAL LOW (ref 34.8–46.6)
HGB: 10.6 g/dL — ABNORMAL LOW (ref 11.6–15.9)
LYMPH#: 0.3 10*3/uL — AB (ref 0.9–3.3)
LYMPH%: 17.2 % (ref 14.0–49.7)
MCH: 29.3 pg (ref 25.1–34.0)
MCHC: 32.6 g/dL (ref 31.5–36.0)
MCV: 89.7 fL (ref 79.5–101.0)
MONO#: 0.3 10*3/uL (ref 0.1–0.9)
MONO%: 19.8 % — ABNORMAL HIGH (ref 0.0–14.0)
NEUT#: 0.9 10*3/uL — ABNORMAL LOW (ref 1.5–6.5)
NEUT%: 62.3 % (ref 38.4–76.8)
Platelets: 190 10*3/uL (ref 145–400)
RBC: 3.63 10*6/uL — AB (ref 3.70–5.45)
RDW: 21.9 % — ABNORMAL HIGH (ref 11.2–14.5)
WBC: 1.5 10*3/uL — AB (ref 3.9–10.3)

## 2015-09-30 LAB — COMPREHENSIVE METABOLIC PANEL
ALT: 15 U/L (ref 0–55)
ANION GAP: 9 meq/L (ref 3–11)
AST: 16 U/L (ref 5–34)
Albumin: 3.7 g/dL (ref 3.5–5.0)
Alkaline Phosphatase: 91 U/L (ref 40–150)
BILIRUBIN TOTAL: 0.52 mg/dL (ref 0.20–1.20)
BUN: 9.6 mg/dL (ref 7.0–26.0)
CHLORIDE: 108 meq/L (ref 98–109)
CO2: 22 meq/L (ref 22–29)
CREATININE: 0.8 mg/dL (ref 0.6–1.1)
Calcium: 9.6 mg/dL (ref 8.4–10.4)
EGFR: 80 mL/min/{1.73_m2} — ABNORMAL LOW (ref >90)
GLUCOSE: 109 mg/dL (ref 70–140)
Potassium: 3.1 mEq/L — ABNORMAL LOW (ref 3.5–5.1)
Sodium: 139 mEq/L (ref 136–145)
TOTAL PROTEIN: 7.5 g/dL (ref 6.4–8.3)

## 2015-09-30 MED ORDER — FAMOTIDINE IN NACL 20-0.9 MG/50ML-% IV SOLN
INTRAVENOUS | Status: AC
  Filled 2015-09-30: qty 50

## 2015-09-30 MED ORDER — DIPHENHYDRAMINE HCL 50 MG/ML IJ SOLN
INTRAMUSCULAR | Status: AC
  Filled 2015-09-30: qty 1

## 2015-09-30 MED ORDER — POTASSIUM CHLORIDE ER 20 MEQ PO TBCR
20.0000 meq | EXTENDED_RELEASE_TABLET | Freq: Every day | ORAL | Status: DC
Start: 2015-09-30 — End: 2015-11-11

## 2015-09-30 MED ORDER — TBO-FILGRASTIM 480 MCG/0.8ML ~~LOC~~ SOSY
480.0000 ug | PREFILLED_SYRINGE | Freq: Once | SUBCUTANEOUS | Status: AC
  Administered 2015-09-30: 480 ug via SUBCUTANEOUS
  Filled 2015-09-30: qty 0.8

## 2015-09-30 NOTE — Progress Notes (Signed)
[facility] [phone]   [phone] Multidisciplinary thoracic oncology clinic  OFFICE PROGRESS NOTE  No PCP Per Patient No address on file  DIAGNOSIS: Stage IIIA (T2b, N2, M0) non-small cell lung cancer, invasive poorly differentiated squamous cell carcinoma diagnosed in November 2016 and presented with large right hilar mass with collapse of the right middle lobe and extension into the right upper lobe and right lower lobe along the hilum with right paratracheal and subcarinal lymphadenopathy. PDL 1 testing is still pending.  PRIOR THERAPY: None  CURRENT THERAPY: Concurrent chemoradiation with weekly carboplatin for AUC of 2 and paclitaxel 45 MG/M2. First dose 08/19/2015. Status post 4 cycles.  INTERVAL HISTORY: Theresa [name] 55 y.o. female returns to the clinic today for follow-up visit accompanied by her daughter and sister. The patient is feeling fine today with no specific complaints. She is currently undergoing concurrent chemoradiation with weekly carboplatin and paclitaxel and tolerating her treatment fairly well. She was recently admitted to [facility] with chest painas well as odynophasia and dysphagia. She was also found to have tachycardia and the patient was started on metoprolol for questionable atrial flutter. She is feeling much better and she resumed her radiotherapy. She was supposed to start cycle #5 of her concurrent chemotherapy today but she continues to have low absolute neutrophil count. CT scan of the chest during her admission showed improvement of her disease.  MEDICAL HISTORY: Past Medical History  Diagnosis Date  . Pneumonia   . Cancer ([place])     5 years ago - cervical cancer  . Non-small cell carcinoma of lung, stage 3 ([place]) 08/08/2015  . Encounter for antineoplastic chemotherapy 08/27/2015  . Paroxysmal atrial flutter ([place]) 09/16/2015    ALLERGIES:  has No Known Allergies.  MEDICATIONS:  Current  Outpatient Prescriptions  Medication Sig Dispense Refill  . bismuth subsalicylate (PEPTO-BISMOL) 262 MG/15ML suspension Take 30 mLs by mouth every 6 (six) hours as needed. 360 mL 0  . Calcium Carb-Cholecalciferol (CALCIUM 600 + D) 600-200 MG-UNIT TABS Take 2 capsules by mouth daily.    . chlorpheniramine-HYDROcodone (TUSSIONEX) 10-8 MG/5ML SUER Take 5 mLs by mouth every 12 (twelve) hours as needed for cough. 140 mL 0  . magic mouthwash w/lidocaine SOLN Take 5 mLs by mouth 3 (three) times daily as needed for mouth pain. 240 mL 1  . metoprolol tartrate (LOPRESSOR) 25 MG tablet Take 1 tablet (25 mg total) by mouth 2 (two) times daily. 60 tablet 3  . Multiple Vitamin (MULTIVITAMIN WITH MINERALS) TABS tablet Take 1 tablet by mouth daily.    Marland Kitchen oxyCODONE-acetaminophen (PERCOCET/ROXICET) 5-325 MG tablet Take 1 tablet by mouth every 6 (six) hours as needed for severe pain. 40 tablet 0  . pantoprazole (PROTONIX) 40 MG tablet Take 1 tablet (40 mg total) by mouth 2 (two) times daily. 60 tablet 0  . prochlorperazine (COMPAZINE) 10 MG tablet Take 1 tablet (10 mg total) by mouth every 6 (six) hours as needed for nausea or vomiting. 30 tablet 0  . sucralfate (CARAFATE) 1 g tablet Take 1 tablet (1 g total) by mouth 4 (four) times daily -  with meals and at bedtime. 5 min before meals for radiation induced esophagitis 60 tablet 5  . Wound Cleansers (RADIAPLEX EX) Apply topically.     No current facility-administered medications for this visit.    SURGICAL HISTORY:  Past Surgical History  Procedure Laterality Date  . Wisdom tooth extraction    . Tubal  ligation    . Video bronchoscopy with endobronchial ultrasound N/A 08/02/2015    Procedure: VIDEO BRONCHOSCOPY WITH ENDOBRONCHIAL ULTRASOUND;  Surgeon: [name], MD;  Location: [facility];  Service: Thoracic;  Laterality: N/A;    REVIEW OF SYSTEMS:  A comprehensive review of systems was negative except for: Constitutional: positive for  fatigue Gastrointestinal: positive for odynophagia   PHYSICAL EXAMINATION: General appearance: alert, cooperative and no distress Head: Normocephalic, without obvious abnormality, atraumatic Neck: no adenopathy, no JVD, supple, symmetrical, trachea midline and thyroid not enlarged, symmetric, no tenderness/mass/nodules Lymph nodes: Cervical, supraclavicular, and axillary nodes normal. Resp: clear to auscultation bilaterally Back: symmetric, no curvature. ROM normal. No CVA tenderness. Cardio: regular rate and rhythm, S1, S2 normal, no murmur, click, rub or gallop GI: soft, non-tender; bowel sounds normal; no masses,  no organomegaly Extremities: extremities normal, atraumatic, no cyanosis or edema Neurologic: Alert and oriented X 3, normal strength and tone. Normal symmetric reflexes. Normal coordination and gait  ECOG PERFORMANCE STATUS: 1 - Symptomatic but completely ambulatory  Blood pressure 110/76, pulse 106, temperature 97.8 F (36.6 C), temperature source Oral, resp. rate 17, height '5\' 8"'$  (1.727 m), weight 213 lb 6.4 oz (96.798 kg), SpO2 100 %.  LABORATORY DATA: Lab Results  Component Value Date   WBC 1.5* 09/30/2015   HGB 10.6* 09/30/2015   HCT 32.6* 09/30/2015   MCV 89.7 09/30/2015   PLT 190 09/30/2015      Chemistry      Component Value Date/Time   NA 139 09/30/2015 1321   NA 143 09/19/2015 0448   K 3.1* 09/30/2015 1321   K 3.8 09/19/2015 0448   CL 106 09/19/2015 0448   CO2 22 09/30/2015 1321   CO2 27 09/19/2015 0448   BUN 9.6 09/30/2015 1321   BUN 9 09/19/2015 0448   CREATININE 0.8 09/30/2015 1321   CREATININE 0.91 09/19/2015 0448      Component Value Date/Time   CALCIUM 9.6 09/30/2015 1321   CALCIUM 9.5 09/19/2015 0448   ALKPHOS 91 09/30/2015 1321   ALKPHOS 70 09/17/2015 0442   AST 16 09/30/2015 1321   AST 16 09/17/2015 0442   ALT 15 09/30/2015 1321   ALT 16 09/17/2015 0442   BILITOT 0.52 09/30/2015 1321   BILITOT 1.1 09/17/2015 0442        RADIOGRAPHIC STUDIES: Dg Chest 2 View  09/16/2015  CLINICAL DATA:  55 year old female with right-sided chest pain and a history of stage III non-small cell carcinoma of the right lung EXAM: CHEST  2 VIEW COMPARISON:  Recent prior PET-CT 08/07/2015 ; prior chest x-ray 08/01/2015 FINDINGS: Cardiac and mediastinal contours are within normal limits. Significantly decreased atelectasis and opacity in the right middle lobe compared to prior imaging. Right hilar fullness is also less conspicuous than previously seen. Stable chronic bronchitic change. Probable bibasilar subsegmental atelectasis. No pneumothorax, pleural effusion or new focal airspace consolidation. No acute osseous abnormality. IMPRESSION: 1. No acute cardiopulmonary disease. 2. Evidence of interval treatment effect with significantly less conspicuous right hilar mass and associated right middle lobe atelectasis compared to imaging from November 2016. Electronically Signed   By: Jacqulynn Cadet M.D.   On: 09/16/2015 16:34   Ct Angio Chest Pe W/cm &/or Wo Cm  09/16/2015  CLINICAL DATA:  55 year old female with chest pain and known lung cancer currently undergoing chemotherapy. EXAM: CT ANGIOGRAPHY CHEST WITH CONTRAST TECHNIQUE: Multidetector CT imaging of the chest was performed using the standard protocol during bolus administration of intravenous contrast. Multiplanar CT image  reconstructions and MIPs were obtained to evaluate the vascular anatomy. CONTRAST:  161m OMNIPAQUE IOHEXOL 350 MG/ML SOLN COMPARISON:  Chest x-ray obtained earlier today ; prior PET-CT 08/07/2015 FINDINGS: Mediastinum: Decreasing mediastinal adenopathy. Index right paratracheal lymph node measures 12 mm in short axis compared to 20 mm previously. The subcarinal lymphadenopathy has also decreased in size at 17 mm in short axis compared to 20 mm previously. The right hilar mass remains a amorphous and difficult to measure discretely. However, the abnormality appears  less full and there is improving aeration of the right middle lobe bronchi with a commensurate decrease in right middle lobe atelectasis. Heart/Vascular: Aberrant right subclavian artery. No aortic aneurysm or dissection. Adequate opacification of the pulmonary arteries to the proximal segmental level. No evidence of focal central filling defect to suggest acute pulmonary embolus. The heart is within normal limits for size. No pericardial effusion. Lungs/Pleura: Significantly decreased size of the right perihilar/medial right upper lobe pulmonary mass compared to the prior PET-CT. Although difficult to measure precisely given the a amorphous configuration, the mass measures approximately 3.2 by 3.4 cm compared to 6.1 x 4.4 cm previously. Additionally, there is less atelectasis in the right upper and right middle lobe. Persistent attenuation of the right middle lobe pulmonary arterial branches. Unchanged past she had ground-glass attenuation opacity in the right lung apex of uncertain clinical significance. No new pulmonary mass or nodule. Dependent atelectasis in both lower lobes. Bones/Soft Tissues: No acute fracture or aggressive appearing lytic or blastic osseous lesion. Upper Abdomen: Stable probable 12 mm cyst in the hepatic dome. Otherwise, visualized upper abdominal organs are unremarkable. Review of the MIP images confirms the above findings. IMPRESSION: 1. Negative for acute pulmonary embolus, pneumonia or other acute cardiopulmonary process. 2. Interval response to therapy with significantly decreased size of medial right upper lobe/right perihilar pulmonary mass and associated right hilar and mediastinal metastatic adenopathy. 3. Decreasing right upper and middle lobe atelectasis with partial re- opening of the right middle lobe bronchi. 4. Aberrant right subclavian artery again noted. 5. Additional ancillary findings as above without interval change. Electronically Signed   By: HJacqulynn CadetM.D.    On: 09/16/2015 17:30    ASSESSMENT AND PLAN: This is a very pleasant 55years old African-American female recently diagnosed with a stage IIIa non-small cell lung cancer, squamous cell carcinoma with large right hilar mass in addition to mediastinal lymphadenopathy diagnosed in November 2016. The patient is currently undergoing concurrent chemoradiation with weekly carboplatin and paclitaxel is status post 4 cycles. She has 1 rating her treatment well except for mild fatigue and would odynophagia which improved after starting treatment with Carafate and Protonix. Her absolute neutrophil count is low today. I recommended for the patient to delay cycle #5 until next week. For the chemotherapy-induced neutropenia, I will start the patient on Granix 480 g subcutaneously today and tomorrow. I will arrange for the patient to come back for follow-up visit in 6 weeks for reevaluation after repeating CT scan of the chest for restaging of her disease. She was advised to call immediately if she has any concerning symptoms in the interval. The patient voices understanding of current disease status and treatment options and is in agreement with the current care plan.  All questions were answered. The patient knows to call the clinic with any problems, questions or concerns. We can certainly see the patient much sooner if necessary.  Disclaimer: This note was dictated with voice recognition software. Similar sounding words can inadvertently be  transcribed and may not be corrected upon review.

## 2015-09-30 NOTE — Telephone Encounter (Signed)
Gave patient avs report and appointments for January and February. [facility] will call re ct - patient.

## 2015-10-01 ENCOUNTER — Ambulatory Visit
Admission: RE | Admit: 2015-10-01 | Discharge: 2015-10-01 | Disposition: A | Discharge status: Home / Self Care | Payer: Medicare Other | Source: Ambulatory Visit | Attending: Radiation Oncology | Admitting: Radiation Oncology

## 2015-10-01 ENCOUNTER — Ambulatory Visit (HOSPITAL_BASED_OUTPATIENT_CLINIC_OR_DEPARTMENT_OTHER): Payer: Medicare Other

## 2015-10-01 VITALS — BP 119/75 | HR 103 | Temp 98.0°F

## 2015-10-01 DIAGNOSIS — T451X5A Adverse effect of antineoplastic and immunosuppressive drugs, initial encounter: Principal | ICD-10-CM

## 2015-10-01 DIAGNOSIS — C3491 Malignant neoplasm of unspecified part of right bronchus or lung: Secondary | ICD-10-CM | POA: Diagnosis not present

## 2015-10-01 DIAGNOSIS — D701 Agranulocytosis secondary to cancer chemotherapy: Secondary | ICD-10-CM

## 2015-10-01 DIAGNOSIS — C3401 Malignant neoplasm of right main bronchus: Secondary | ICD-10-CM

## 2015-10-01 DIAGNOSIS — Z5189 Encounter for other specified aftercare: Secondary | ICD-10-CM | POA: Diagnosis not present

## 2015-10-01 DIAGNOSIS — Z510 Encounter for antineoplastic radiation therapy: Secondary | ICD-10-CM | POA: Diagnosis not present

## 2015-10-01 MED ORDER — TBO-FILGRASTIM 480 MCG/0.8ML ~~LOC~~ SOSY
480.0000 ug | PREFILLED_SYRINGE | Freq: Once | SUBCUTANEOUS | Status: AC
  Administered 2015-10-01: 480 ug via SUBCUTANEOUS
  Filled 2015-10-01: qty 0.8

## 2015-10-01 NOTE — Patient Instructions (Signed)

## 2015-10-02 ENCOUNTER — Ambulatory Visit: Payer: Medicare Other

## 2015-10-02 ENCOUNTER — Ambulatory Visit
Admission: RE | Admit: 2015-10-02 | Discharge: 2015-10-02 | Disposition: A | Discharge status: Home / Self Care | Payer: Medicare Other | Source: Ambulatory Visit | Attending: Radiation Oncology | Admitting: Radiation Oncology

## 2015-10-02 DIAGNOSIS — Z510 Encounter for antineoplastic radiation therapy: Secondary | ICD-10-CM | POA: Diagnosis not present

## 2015-10-02 DIAGNOSIS — C3491 Malignant neoplasm of unspecified part of right bronchus or lung: Secondary | ICD-10-CM | POA: Diagnosis not present

## 2015-10-03 ENCOUNTER — Ambulatory Visit
Admission: RE | Admit: 2015-10-03 | Discharge: 2015-10-03 | Disposition: A | Discharge status: Home / Self Care | Payer: Medicare Other | Source: Ambulatory Visit | Attending: Radiation Oncology | Admitting: Radiation Oncology

## 2015-10-03 DIAGNOSIS — Z510 Encounter for antineoplastic radiation therapy: Secondary | ICD-10-CM | POA: Diagnosis not present

## 2015-10-03 DIAGNOSIS — C3491 Malignant neoplasm of unspecified part of right bronchus or lung: Secondary | ICD-10-CM | POA: Diagnosis not present

## 2015-10-04 ENCOUNTER — Encounter: Payer: Self-pay | Admitting: Radiation Oncology

## 2015-10-04 ENCOUNTER — Ambulatory Visit: Payer: Medicare Other

## 2015-10-04 ENCOUNTER — Ambulatory Visit
Admission: RE | Admit: 2015-10-04 | Discharge: 2015-10-04 | Disposition: A | Discharge status: Home / Self Care | Payer: Medicare Other | Source: Ambulatory Visit | Attending: Radiation Oncology | Admitting: Radiation Oncology

## 2015-10-04 VITALS — BP 117/73 | HR 107 | Resp 16 | Wt 211.6 lb

## 2015-10-04 DIAGNOSIS — C342 Malignant neoplasm of middle lobe, bronchus or lung: Secondary | ICD-10-CM

## 2015-10-04 DIAGNOSIS — C3491 Malignant neoplasm of unspecified part of right bronchus or lung: Secondary | ICD-10-CM | POA: Diagnosis not present

## 2015-10-04 DIAGNOSIS — Z510 Encounter for antineoplastic radiation therapy: Secondary | ICD-10-CM | POA: Diagnosis not present

## 2015-10-04 NOTE — Progress Notes (Signed)
  Radiation Oncology         [phone]   Name: Theresa [name] MRN: [id number]   Date: 10/04/2015  DOB: [birth year]     Weekly Radiation Therapy Management    ICD-9-CM ICD-10-CM   1. Non-small cell carcinoma of lung, stage 3 (HCC) 162.4 C34.2     Current Dose: 64 Gy  Planned Dose:  66 Gy  Narrative The patient presents for routine under treatment assessment.  Weight and vitals stable. Denies pain. Faint hyperpigmentation of chest noted. Reports using radiaplex as directed. Second tube of radiaplex provided. Encouraged patient to continue use of radiaplex bid for two s/p completion. Patient completes treatment on Monday thus one month follow up appointment card given. Denies pain associated with swallowing but, does report reflux. Reports an occasional dry cough. Reports SOB with exertion. Reports energy level has improved since hospitalization.   The patient is without complaint. Set-up films were reviewed. The chart was checked.  Physical Findings  weight is 211 lb 9.6 oz (95.981 kg). Her blood pressure is 117/73 and her pulse is 107. Her respiration is 16 and oxygen saturation is 99%.  Weight essentially stable.  No significant changes.  Impression The patient is tolerating radiation.  Plan Continue treatment as planned. Patient is scheduled to complete treatment next Monday 10/06/14. We will follow up in one month.         Sheral Apley Tammi Klippel, M.D.   This document serves as a record of services personally performed by Tyler Pita, MD. It was created on his behalf by Jenell Milliner, a trained medical scribe. The creation of this record is based on the scribe's personal observations and the provider's statements to them. This document has been checked and approved by the attending provider.

## 2015-10-04 NOTE — Progress Notes (Signed)
Weight and vitals stable. Denies pain. Faint hyperpigmentation of chest noted. Reports using radiaplex as directed. Second tube of radiaplex provided. Encouraged patient to continue use of radiaplex bid for two s/p completion. Patient completes treatment on Monday thus one month follow up appointment card given. Denies pain associated with swallowing but, does reports reflux. Reports an occasional dry cough. Reports SOB of with exertion. Reports energy level has improved since hospitalization.   BP 117/73 mmHg  Pulse 107  Resp 16  Wt 211 lb 9.6 oz (95.981 kg)  SpO2 99% Wt Readings from Last 3 Encounters:  10/04/15 211 lb 9.6 oz (95.981 kg)  09/30/15 213 lb 6.4 oz (96.798 kg)  09/27/15 214 lb 3.2 oz (97.16 kg)

## 2015-10-07 ENCOUNTER — Encounter: Payer: Self-pay | Admitting: Radiation Oncology

## 2015-10-07 ENCOUNTER — Other Ambulatory Visit (HOSPITAL_BASED_OUTPATIENT_CLINIC_OR_DEPARTMENT_OTHER): Payer: Medicare Other

## 2015-10-07 ENCOUNTER — Ambulatory Visit (HOSPITAL_BASED_OUTPATIENT_CLINIC_OR_DEPARTMENT_OTHER): Payer: Medicare Other

## 2015-10-07 ENCOUNTER — Ambulatory Visit
Admission: RE | Admit: 2015-10-07 | Discharge: 2015-10-07 | Disposition: A | Discharge status: Home / Self Care | Payer: Medicare Other | Source: Ambulatory Visit | Attending: Radiation Oncology | Admitting: Radiation Oncology

## 2015-10-07 DIAGNOSIS — C3401 Malignant neoplasm of right main bronchus: Secondary | ICD-10-CM

## 2015-10-07 DIAGNOSIS — Z5111 Encounter for antineoplastic chemotherapy: Secondary | ICD-10-CM

## 2015-10-07 DIAGNOSIS — C3491 Malignant neoplasm of unspecified part of right bronchus or lung: Secondary | ICD-10-CM

## 2015-10-07 LAB — COMPREHENSIVE METABOLIC PANEL
ALT: 14 U/L (ref 0–55)
ANION GAP: 10 meq/L (ref 3–11)
AST: 18 U/L (ref 5–34)
Albumin: 3.8 g/dL (ref 3.5–5.0)
Alkaline Phosphatase: 82 U/L (ref 40–150)
BUN: 6 mg/dL — ABNORMAL LOW (ref 7.0–26.0)
CALCIUM: 9.7 mg/dL (ref 8.4–10.4)
CHLORIDE: 106 meq/L (ref 98–109)
CO2: 23 mEq/L (ref 22–29)
Creatinine: 0.8 mg/dL (ref 0.6–1.1)
EGFR: 81 mL/min/{1.73_m2} — ABNORMAL LOW (ref >90)
Glucose: 108 mg/dl (ref 70–140)
POTASSIUM: 3.5 meq/L (ref 3.5–5.1)
Sodium: 139 mEq/L (ref 136–145)
Total Bilirubin: 0.61 mg/dL (ref 0.20–1.20)
Total Protein: 7.8 g/dL (ref 6.4–8.3)

## 2015-10-07 LAB — CBC WITH DIFFERENTIAL/PLATELET
BASO%: 2.1 % — ABNORMAL HIGH (ref 0.0–2.0)
BASOS ABS: 0 10*3/uL (ref 0.0–0.1)
EOS ABS: 0 10*3/uL (ref 0.0–0.5)
EOS%: 0.5 % (ref 0.0–7.0)
HCT: 31.8 % — ABNORMAL LOW (ref 34.8–46.6)
HEMOGLOBIN: 10.7 g/dL — AB (ref 11.6–15.9)
LYMPH%: 15.4 % (ref 14.0–49.7)
MCH: 29.5 pg (ref 25.1–34.0)
MCHC: 33.6 g/dL (ref 31.5–36.0)
MCV: 87.6 fL (ref 79.5–101.0)
MONO#: 0.5 10*3/uL (ref 0.1–0.9)
MONO%: 25 % — AB (ref 0.0–14.0)
NEUT#: 1.1 10*3/uL — ABNORMAL LOW (ref 1.5–6.5)
NEUT%: 57 % (ref 38.4–76.8)
NRBC: 2 % — AB (ref 0–0)
PLATELETS: 216 10*3/uL (ref 145–400)
RBC: 3.63 10*6/uL — AB (ref 3.70–5.45)
RDW: 21 % — ABNORMAL HIGH (ref 11.2–14.5)
WBC: 1.9 10*3/uL — ABNORMAL LOW (ref 3.9–10.3)
lymph#: 0.3 10*3/uL — ABNORMAL LOW (ref 0.9–3.3)

## 2015-10-07 MED ORDER — SODIUM CHLORIDE 0.9 % IV SOLN
Freq: Once | INTRAVENOUS | Status: AC
  Administered 2015-10-07: 16:00:00 via INTRAVENOUS

## 2015-10-07 MED ORDER — SODIUM CHLORIDE 0.9 % IV SOLN
276.0000 mg | Freq: Once | INTRAVENOUS | Status: AC
  Administered 2015-10-07: 280 mg via INTRAVENOUS
  Filled 2015-10-07: qty 28

## 2015-10-07 MED ORDER — DIPHENHYDRAMINE HCL 50 MG/ML IJ SOLN
INTRAMUSCULAR | Status: AC
  Filled 2015-10-07: qty 1

## 2015-10-07 MED ORDER — DIPHENHYDRAMINE HCL 50 MG/ML IJ SOLN
50.0000 mg | Freq: Once | INTRAMUSCULAR | Status: AC
  Administered 2015-10-07: 50 mg via INTRAVENOUS

## 2015-10-07 MED ORDER — FAMOTIDINE IN NACL 20-0.9 MG/50ML-% IV SOLN
INTRAVENOUS | Status: AC
  Filled 2015-10-07: qty 50

## 2015-10-07 MED ORDER — PACLITAXEL CHEMO INJECTION 300 MG/50ML
45.0000 mg/m2 | Freq: Once | INTRAVENOUS | Status: AC
  Administered 2015-10-07: 96 mg via INTRAVENOUS
  Filled 2015-10-07: qty 16

## 2015-10-07 MED ORDER — FAMOTIDINE IN NACL 20-0.9 MG/50ML-% IV SOLN
20.0000 mg | Freq: Once | INTRAVENOUS | Status: AC
  Administered 2015-10-07: 20 mg via INTRAVENOUS

## 2015-10-07 MED ORDER — SODIUM CHLORIDE 0.9 % IV SOLN
Freq: Once | INTRAVENOUS | Status: AC
  Administered 2015-10-07: 16:00:00 via INTRAVENOUS
  Filled 2015-10-07: qty 8

## 2015-10-07 NOTE — Progress Notes (Signed)
Dr. Julien Nordmann gave the ok to treat to Theresa [name] with the recent [place] lab value from today.

## 2015-10-07 NOTE — Patient Instructions (Signed)
[facility] Discharge Instructions for Patients Receiving Chemotherapy  Today you received the following chemotherapy agents Taxol and Carboplatin. To help prevent nausea and vomiting after your treatment, we encourage you to take your nausea medication as directed.  If you develop nausea and vomiting that is not controlled by your nausea medication, call the clinic.   BELOW ARE SYMPTOMS THAT SHOULD BE REPORTED IMMEDIATELY:  *FEVER GREATER THAN 100.5 F  *CHILLS WITH OR WITHOUT FEVER  NAUSEA AND VOMITING THAT IS NOT CONTROLLED WITH YOUR NAUSEA MEDICATION  *UNUSUAL SHORTNESS OF BREATH  *UNUSUAL BRUISING OR BLEEDING  TENDERNESS IN MOUTH AND THROAT WITH OR WITHOUT PRESENCE OF ULCERS  *URINARY PROBLEMS  *BOWEL PROBLEMS  UNUSUAL RASH Items with * indicate a potential emergency and should be followed up as soon as possible.  Feel free to call the clinic you have any questions or concerns. The clinic phone number is [phone].  Please show the CHEMO ALERT CARD at check-in to the Emergency Department and triage nurse.    

## 2015-10-07 NOTE — Progress Notes (Signed)
Per Julien Nordmann it is Ok to treat pt today with chemo based on todays labs.

## 2015-10-08 MED ORDER — RADIAPLEXRX EX GEL
Freq: Once | CUTANEOUS | Status: AC
  Administered 2015-10-08: 15:00:00 via TOPICAL

## 2015-10-08 NOTE — Progress Notes (Signed)
Cardiology Office Note:    Date:  10/09/2015   ID:  Doris Cheadle, DOB 1961/07/02, MRN [id number]  PCP:  No PCP Per Patient  Cardiologist:  Dr. Darlin Coco >>   Electrophysiologist:  n/a  Chief Complaint  Patient presents with  . Hospitalization Follow-up    Paroxymasl Atrial Flutter    History of Present Illness:    Theresa [name] is a 55 y.o. female with a hx of stage III non-small cell lung CA. Patient is being treated with combination chemotherapy (carboplatin and paclitaxel) and radiation. She was admitted 12/26-12/28 with chest pain and palpitations in the setting of atrial flutter with RVR. CT was negative for pulmonary embolism.  She converted to sinus rhythm prior to IV rate controlling medication initiation. Troponin levels remained normal.  Echocardiogram demonstrated normal LV function with mild diastolic dysfunction. CHADS2-VASc=1. Long-term anticoagulation was not felt to be indicated. Low-dose beta blocker was recommended to help with sinus tachycardia.  She was noted to have dysphagia likely related to radiation. She was treated with PPI, Carafate and Pepto-Bismol.  Returns for FU.     Past Medical History  Diagnosis Date  . Pneumonia   . Cancer ([place])     5 years ago - cervical cancer  . Non-small cell carcinoma of lung, stage 3 ([place]) 08/08/2015  . Encounter for antineoplastic chemotherapy 08/27/2015  . Paroxysmal atrial flutter ([place]) 09/16/2015  . Hypokalemia 09/30/2015    Past Surgical History  Procedure Laterality Date  . Wisdom tooth extraction    . Tubal ligation    . Video bronchoscopy with endobronchial ultrasound N/A 08/02/2015    Procedure: VIDEO BRONCHOSCOPY WITH ENDOBRONCHIAL ULTRASOUND;  Surgeon: [name], MD;  Location: [facility] OR;  Service: Thoracic;  Laterality: N/A;    Current Medications: Outpatient Prescriptions Prior to Visit  Medication Sig Dispense Refill  . bismuth subsalicylate (PEPTO-BISMOL) 262 MG/15ML suspension  Take 30 mLs by mouth every 6 (six) hours as needed. 360 mL 0  . Calcium Carb-Cholecalciferol (CALCIUM 600 + D) 600-200 MG-UNIT TABS Take 2 capsules by mouth daily.    . chlorpheniramine-HYDROcodone (TUSSIONEX) 10-8 MG/5ML SUER Take 5 mLs by mouth every 12 (twelve) hours as needed for cough. 140 mL 0  . magic mouthwash w/lidocaine SOLN Take 5 mLs by mouth 3 (three) times daily as needed for mouth pain. 240 mL 1  . metoprolol tartrate (LOPRESSOR) 25 MG tablet Take 1 tablet (25 mg total) by mouth 2 (two) times daily. 60 tablet 3  . Multiple Vitamin (MULTIVITAMIN WITH MINERALS) TABS tablet Take 1 tablet by mouth daily.    Marland Kitchen oxyCODONE-acetaminophen (PERCOCET/ROXICET) 5-325 MG tablet Take 1 tablet by mouth every 6 (six) hours as needed for severe pain. 40 tablet 0  . pantoprazole (PROTONIX) 40 MG tablet Take 1 tablet (40 mg total) by mouth 2 (two) times daily. 60 tablet 0  . potassium chloride 20 MEQ TBCR Take 20 mEq by mouth daily. 7 tablet 0  . prochlorperazine (COMPAZINE) 10 MG tablet Take 1 tablet (10 mg total) by mouth every 6 (six) hours as needed for nausea or vomiting. 30 tablet 0  . sucralfate (CARAFATE) 1 g tablet Take 1 tablet (1 g total) by mouth 4 (four) times daily -  with meals and at bedtime. 5 min before meals for radiation induced esophagitis 60 tablet 5  . Wound Cleansers (RADIAPLEX EX) Apply topically.     No facility-administered medications prior to visit.     Allergies:   Review of  patient's allergies indicates no known allergies.   Social History   Social History  . Marital Status: Single    Spouse Name: N/A  . Number of Children: N/A  . Years of Education: N/A   Social History Main Topics  . Smoking status: Former Smoker -- 0.30 packs/day for 5 years    Types: Cigarettes    Quit date: 07/31/1990  . Smokeless tobacco: Never Used  . Alcohol Use: No  . Drug Use: No  . Sexual Activity: Not on file   Other Topics Concern  . Not on file   Social History Narrative      Family History:  The patient's family history includes COPD in her mother; Diabetes type II in her mother; Heart disease in her mother; High Cholesterol in her mother; Lung cancer in her father.   ROS:   Please see the history of present illness.    ROS All other systems reviewed and are negative.   Physical Exam:    VS:  There were no vitals taken for this visit.   GEN: Well nourished, well developed, in no acute distress HEENT: normal Neck: no JVD, no masses Cardiac: Normal S1/S2, RRR; no murmurs, rubs, or gallops, no edema;   carotid bruits,   Respiratory:  clear to auscultation bilaterally; no wheezing, rhonchi or rales GI: soft, nontender, nondistended, + BS MS: no deformity or atrophy Skin: warm and dry, no rash Neuro:  Bilateral strength equal, no focal deficits  Psych: Alert and oriented x 3, normal affect  Wt Readings from Last 3 Encounters:  10/04/15 211 lb 9.6 oz (95.981 kg)  09/30/15 213 lb 6.4 oz (96.798 kg)  09/27/15 214 lb 3.2 oz (97.16 kg)      Studies/Labs Reviewed:    EKG:  EKG is  ordered today.  The ekg ordered today demonstrates   Recent Labs: 09/17/2015: TSH 3.740 10/07/2015: ALT 14; BUN 6.0*; Creatinine 0.8; HGB 10.7*; Platelets 216; Potassium 3.5; Sodium 139   Recent Lipid Panel No results found for: CHOL, TRIG, HDL, CHOLHDL, VLDL, LDLCALC, LDLDIRECT  Additional studies/ records that were reviewed today include:   Echo 09/17/15 EF 55-60%, no RWMA, Gr 1 DD, mild MR  Ct Angio Chest Pe W/cm &/or Wo Cm   09/16/2015    IMPRESSION: 1. Negative for acute pulmonary embolus, pneumonia or other acute cardiopulmonary process. 2. Interval response to therapy with significantly decreased size of medial right upper lobe/right perihilar pulmonary mass and associated right hilar and mediastinal metastatic adenopathy. 3. Decreasing right upper and middle lobe atelectasis with partial re- opening of the right middle lobe bronchi. 4. Aberrant right subclavian  artery again noted. 5. Additional ancillary findings as above without interval change. Electronically Signed   By: Jacqulynn Cadet M.D.   On: 09/16/2015 17:30     ASSESSMENT:    1. Paroxysmal atrial flutter ([place])   2. Non-small cell carcinoma of lung, stage 3 (HCC)     PLAN:    In order of problems listed above:  1.     Medication Adjustments/Labs and Tests Ordered: Current medicines are reviewed at length with the patient today.  Concerns regarding medicines are outlined above.  Medication changes, Labs and Tests ordered today are outlined in the Patient Instructions noted below. There are no Patient Instructions on file for this visit.   Signed, Richardson Dopp, PA-C  10/09/2015 8:42 AM    [facility] [place], [place],   [address] Phone: [phone]; Fax: [phone]     This encounter was created in error - please disregard.

## 2015-10-08 NOTE — Addendum Note (Signed)
Encounter addended by: Heywood Footman, RN on: 10/08/2015  2:33 PM<BR>     Documentation filed: Inpatient MAR

## 2015-10-08 NOTE — Addendum Note (Signed)
Encounter addended by: Heywood Footman, RN on: 10/08/2015  2:32 PM<BR>     Documentation filed: [employer], Orders

## 2015-10-09 ENCOUNTER — Encounter: Payer: Medicare Other | Admitting: Physician Assistant

## 2015-10-10 ENCOUNTER — Telehealth: Payer: Self-pay | Admitting: *Deleted

## 2015-10-10 ENCOUNTER — Inpatient Hospital Stay (HOSPITAL_COMMUNITY)
Admission: EM | Admit: 2015-10-10 | Discharge: 2015-10-13 | DRG: 392 | Disposition: A | Discharge status: Home / Self Care | Payer: Medicare Other | Attending: Internal Medicine | Admitting: Internal Medicine

## 2015-10-10 ENCOUNTER — Ambulatory Visit (HOSPITAL_BASED_OUTPATIENT_CLINIC_OR_DEPARTMENT_OTHER): Payer: Medicare Other | Admitting: Nurse Practitioner

## 2015-10-10 ENCOUNTER — Ambulatory Visit (HOSPITAL_BASED_OUTPATIENT_CLINIC_OR_DEPARTMENT_OTHER): Payer: Medicare Other

## 2015-10-10 ENCOUNTER — Encounter (HOSPITAL_COMMUNITY): Payer: Self-pay | Admitting: Emergency Medicine

## 2015-10-10 VITALS — BP 105/83 | HR 119 | Temp 98.5°F | Resp 18

## 2015-10-10 DIAGNOSIS — C3490 Malignant neoplasm of unspecified part of unspecified bronchus or lung: Secondary | ICD-10-CM

## 2015-10-10 DIAGNOSIS — C342 Malignant neoplasm of middle lobe, bronchus or lung: Secondary | ICD-10-CM

## 2015-10-10 DIAGNOSIS — R112 Nausea with vomiting, unspecified: Secondary | ICD-10-CM

## 2015-10-10 DIAGNOSIS — E876 Hypokalemia: Secondary | ICD-10-CM | POA: Diagnosis present

## 2015-10-10 DIAGNOSIS — K208 Other esophagitis: Secondary | ICD-10-CM | POA: Diagnosis present

## 2015-10-10 DIAGNOSIS — T451X5A Adverse effect of antineoplastic and immunosuppressive drugs, initial encounter: Secondary | ICD-10-CM | POA: Diagnosis not present

## 2015-10-10 DIAGNOSIS — D701 Agranulocytosis secondary to cancer chemotherapy: Secondary | ICD-10-CM | POA: Diagnosis not present

## 2015-10-10 DIAGNOSIS — Y842 Radiological procedure and radiotherapy as the cause of abnormal reaction of the patient, or of later complication, without mention of misadventure at the time of the procedure: Secondary | ICD-10-CM | POA: Diagnosis present

## 2015-10-10 DIAGNOSIS — E860 Dehydration: Secondary | ICD-10-CM

## 2015-10-10 DIAGNOSIS — K5900 Constipation, unspecified: Secondary | ICD-10-CM | POA: Diagnosis present

## 2015-10-10 DIAGNOSIS — K5901 Slow transit constipation: Secondary | ICD-10-CM | POA: Diagnosis not present

## 2015-10-10 DIAGNOSIS — Z833 Family history of diabetes mellitus: Secondary | ICD-10-CM

## 2015-10-10 DIAGNOSIS — A084 Viral intestinal infection, unspecified: Principal | ICD-10-CM | POA: Diagnosis present

## 2015-10-10 DIAGNOSIS — I10 Essential (primary) hypertension: Secondary | ICD-10-CM | POA: Diagnosis present

## 2015-10-10 DIAGNOSIS — Z8349 Family history of other endocrine, nutritional and metabolic diseases: Secondary | ICD-10-CM

## 2015-10-10 DIAGNOSIS — D630 Anemia in neoplastic disease: Secondary | ICD-10-CM | POA: Diagnosis not present

## 2015-10-10 DIAGNOSIS — Z801 Family history of malignant neoplasm of trachea, bronchus and lung: Secondary | ICD-10-CM

## 2015-10-10 DIAGNOSIS — Z8541 Personal history of malignant neoplasm of cervix uteri: Secondary | ICD-10-CM

## 2015-10-10 DIAGNOSIS — Z79899 Other long term (current) drug therapy: Secondary | ICD-10-CM | POA: Diagnosis not present

## 2015-10-10 DIAGNOSIS — R531 Weakness: Secondary | ICD-10-CM | POA: Diagnosis not present

## 2015-10-10 DIAGNOSIS — Z87891 Personal history of nicotine dependence: Secondary | ICD-10-CM

## 2015-10-10 DIAGNOSIS — R1110 Vomiting, unspecified: Secondary | ICD-10-CM | POA: Diagnosis not present

## 2015-10-10 DIAGNOSIS — Z825 Family history of asthma and other chronic lower respiratory diseases: Secondary | ICD-10-CM | POA: Diagnosis not present

## 2015-10-10 DIAGNOSIS — Z8249 Family history of ischemic heart disease and other diseases of the circulatory system: Secondary | ICD-10-CM

## 2015-10-10 DIAGNOSIS — R000 Tachycardia, unspecified: Secondary | ICD-10-CM | POA: Diagnosis not present

## 2015-10-10 LAB — COMPREHENSIVE METABOLIC PANEL
ALBUMIN: 3.5 g/dL (ref 3.5–5.0)
ALT: 13 U/L (ref 0–55)
ALT: 15 U/L (ref 14–54)
ANION GAP: 6 (ref 5–15)
AST: 15 U/L (ref 5–34)
AST: 17 U/L (ref 15–41)
Albumin: 4 g/dL (ref 3.5–5.0)
Alkaline Phosphatase: 78 U/L (ref 40–150)
Alkaline Phosphatase: 60 U/L (ref 38–126)
Anion Gap: 11 mEq/L (ref 3–11)
BILIRUBIN TOTAL: 1 mg/dL (ref 0.3–1.2)
BUN: 6.5 mg/dL — AB (ref 7.0–26.0)
BUN: 7 mg/dL (ref 6–20)
CO2: 22 meq/L (ref 22–29)
CO2: 23 mmol/L (ref 22–32)
Calcium: 10 mg/dL (ref 8.4–10.4)
Calcium: 8.5 mg/dL — ABNORMAL LOW (ref 8.9–10.3)
Chloride: 105 mEq/L (ref 98–109)
Chloride: 110 mmol/L (ref 101–111)
Creatinine, Ser: 0.59 mg/dL (ref 0.44–1.00)
Creatinine: 0.8 mg/dL (ref 0.6–1.1)
EGFR: 84 mL/min/{1.73_m2} — AB (ref >90)
GFR calc Af Amer: >60 mL/min (ref >60)
GFR calc non Af Amer: >60 mL/min (ref >60)
GLUCOSE: 119 mg/dL (ref 70–140)
GLUCOSE: 102 mg/dL — AB (ref 65–99)
POTASSIUM: 3.4 meq/L — AB (ref 3.5–5.1)
POTASSIUM: 3.4 mmol/L — AB (ref 3.5–5.1)
SODIUM: 138 meq/L (ref 136–145)
Sodium: 139 mmol/L (ref 135–145)
TOTAL PROTEIN: 8.2 g/dL (ref 6.4–8.3)
TOTAL PROTEIN: 6.6 g/dL (ref 6.5–8.1)
Total Bilirubin: 1.05 mg/dL (ref 0.20–1.20)

## 2015-10-10 LAB — CBC WITH DIFFERENTIAL/PLATELET
BASO%: 0.5 % (ref 0.0–2.0)
BASOS ABS: 0 10*3/uL (ref 0.0–0.1)
Basophils Absolute: 0 10*3/uL (ref 0.0–0.1)
Basophils Relative: 0 %
EOS ABS: 0 10*3/uL (ref 0.0–0.5)
EOS%: 0.1 % (ref 0.0–7.0)
Eosinophils Absolute: 0 10*3/uL (ref 0.0–0.7)
Eosinophils Relative: 0 %
HCT: 33.4 % — ABNORMAL LOW (ref 34.8–46.6)
HEMATOCRIT: 26.3 % — AB (ref 36.0–46.0)
HEMOGLOBIN: 11.1 g/dL — AB (ref 11.6–15.9)
HEMOGLOBIN: 8.9 g/dL — AB (ref 12.0–15.0)
LYMPH%: 12.5 % — AB (ref 14.0–49.7)
LYMPHS PCT: 12 %
Lymphs Abs: 0.2 10*3/uL — ABNORMAL LOW (ref 0.7–4.0)
MCH: 29.4 pg (ref 25.1–34.0)
MCH: 29.5 pg (ref 26.0–34.0)
MCHC: 33.2 g/dL (ref 31.5–36.0)
MCHC: 33.8 g/dL (ref 30.0–36.0)
MCV: 88.7 fL (ref 79.5–101.0)
MCV: 87.1 fL (ref 78.0–100.0)
MONO ABS: 0.1 10*3/uL (ref 0.1–1.0)
MONO#: 0.2 10*3/uL (ref 0.1–0.9)
MONO%: 9.1 % (ref 0.0–14.0)
Monocytes Relative: 6 %
NEUT%: 77.8 % — ABNORMAL HIGH (ref 38.4–76.8)
NEUTROS ABS: 1.4 10*3/uL — AB (ref 1.5–6.5)
NEUTROS ABS: 1.4 10*3/uL — AB (ref 1.7–7.7)
Neutrophils Relative %: 82 %
Platelets: 239 10*3/uL (ref 145–400)
Platelets: 194 10*3/uL (ref 150–400)
RBC: 3.76 10*6/uL (ref 3.70–5.45)
RBC: 3.02 MIL/uL — AB (ref 3.87–5.11)
RDW: 22.8 % — AB (ref 11.2–14.5)
RDW: 20.5 % — ABNORMAL HIGH (ref 11.5–15.5)
WBC: 1.7 10*3/uL — AB (ref 3.9–10.3)
WBC: 1.7 10*3/uL — AB (ref 4.0–10.5)
lymph#: 0.2 10*3/uL — ABNORMAL LOW (ref 0.9–3.3)

## 2015-10-10 LAB — URINALYSIS, ROUTINE W REFLEX MICROSCOPIC
BILIRUBIN URINE: NEGATIVE
Glucose, UA: NEGATIVE mg/dL
Hgb urine dipstick: NEGATIVE
KETONES UR: NEGATIVE mg/dL
Leukocytes, UA: NEGATIVE
NITRITE: NEGATIVE
PH: 6.5 (ref 5.0–8.0)
Protein, ur: NEGATIVE mg/dL
Specific Gravity, Urine: 1.012 (ref 1.005–1.030)

## 2015-10-10 MED ORDER — SODIUM CHLORIDE 0.9 % IV BOLUS (SEPSIS)
1000.0000 mL | Freq: Once | INTRAVENOUS | Status: AC
  Administered 2015-10-10: 1000 mL via INTRAVENOUS

## 2015-10-10 MED ORDER — LORAZEPAM 2 MG/ML IJ SOLN
INTRAMUSCULAR | Status: AC
  Filled 2015-10-10: qty 1

## 2015-10-10 MED ORDER — SODIUM CHLORIDE 0.9 % IV SOLN
Freq: Once | INTRAVENOUS | Status: AC
  Administered 2015-10-10: 15:00:00 via INTRAVENOUS
  Filled 2015-10-10: qty 4

## 2015-10-10 MED ORDER — DIPHENHYDRAMINE HCL 50 MG/ML IJ SOLN
25.0000 mg | Freq: Once | INTRAMUSCULAR | Status: AC
  Administered 2015-10-10: 25 mg via INTRAVENOUS
  Filled 2015-10-10: qty 1

## 2015-10-10 MED ORDER — ONDANSETRON HCL 8 MG PO TABS: 8.0000 mg | ORAL_TABLET | Freq: Once | ORAL | Status: DC

## 2015-10-10 MED ORDER — METOCLOPRAMIDE HCL 5 MG/ML IJ SOLN
10.0000 mg | Freq: Once | INTRAMUSCULAR | Status: AC
  Administered 2015-10-10: 10 mg via INTRAVENOUS
  Filled 2015-10-10: qty 2

## 2015-10-10 MED ORDER — LORAZEPAM 2 MG/ML IJ SOLN
0.5000 mg | INTRAMUSCULAR | Status: AC
  Administered 2015-10-10: 0.5 mg via INTRAVENOUS

## 2015-10-10 MED ORDER — ONDANSETRON HCL 4 MG/2ML IJ SOLN
4.0000 mg | Freq: Once | INTRAMUSCULAR | Status: AC
  Administered 2015-10-10: 4 mg via INTRAVENOUS
  Filled 2015-10-10: qty 2

## 2015-10-10 MED ORDER — SODIUM CHLORIDE 0.9 % IV SOLN
INTRAVENOUS | Status: AC
  Administered 2015-10-10: 15:00:00 via INTRAVENOUS

## 2015-10-10 NOTE — Telephone Encounter (Signed)
Call from [place] reporting "nausea and vomiting all day yesterday, at least ten to twelve times.  It looked clear and I cant't tell you how much volume it was.  I was up all night, called at 0700 am, was told to go to the ED.  I want to know what Dr. Julien Nordmann thinks.  Today I cough and spit but nothing comes up to throw up (dry heaves), constant spitting."    Has not tried compazine, "Whenenver I try to eat or drink anything, I can't.  It comes back up."  Denies pain.  Last BM Tuesday (10-08-2015).  No abdominal pain or bloating.  "I'm not urinating as much."   Will come in today for [facility].

## 2015-10-10 NOTE — Progress Notes (Signed)
Pt IV saline locked.  Pt transported to ED by wheelchair with sister.  Report given to triage RN.

## 2015-10-10 NOTE — ED Notes (Signed)
Patient requested that we not mention her cancer diagnosis to or around her children.  Please ask patient if it is OK to talk in front of her visitors.

## 2015-10-10 NOTE — ED Notes (Signed)
IV team in with patient.

## 2015-10-10 NOTE — ED Notes (Signed)
Unable to get labs, RN made aware

## 2015-10-10 NOTE — ED Notes (Addendum)
Last chemo Monday and has been sick since. Nausea and vomiting since.  1.5 L NS, 8 mg of zofran, 0.5 mg Ativan around 16:40 this afternoon. 22 G in posterior right hand.

## 2015-10-10 NOTE — ED Provider Notes (Signed)
CSN: [id number]     Arrival date & time 10/10/15  1725 History   None    Chief Complaint  Patient presents with  . Emesis  . Nausea     (Consider location/radiation/quality/duration/timing/severity/associated sxs/prior Treatment) HPI Comments: The patient is a 55 year old female, she presents to the hospital today with a complaint of persistent nausea and vomiting. According to the patient she had been diagnosed with non-small cell lung cancer of the lung in September 2016 after which she started to undergo both radiation and chemotherapy. She had her last dose of chemotherapy 3 days ago, she was told that she was doing very well, she does not have an appointment for over 4 weeks. The patient reports that throughout chemotherapy she had not had any nausea or vomiting, she has had 3 days of persistent nausea vomiting and general ill feeling.   no fevers, no diarrhea, no rashes, no swelling, no headache, no blurred vision, she does feel weak and lightheaded when she stands.   Patient is a 55 y.o. female presenting with vomiting. The history is provided by the patient.  Emesis   Past Medical History  Diagnosis Date  . Pneumonia   . Cancer ([place])     5 years ago - cervical cancer  . Non-small cell carcinoma of lung, stage 3 ([place]) 08/08/2015  . Encounter for antineoplastic chemotherapy 08/27/2015  . Paroxysmal atrial flutter ([place]) 09/16/2015  . Hypokalemia 09/30/2015   Past Surgical History  Procedure Laterality Date  . Wisdom tooth extraction    . Tubal ligation    . Video bronchoscopy with endobronchial ultrasound N/A 08/02/2015    Procedure: VIDEO BRONCHOSCOPY WITH ENDOBRONCHIAL ULTRASOUND;  Surgeon: [name], MD;  Location: [facility] OR;  Service: Thoracic;  Laterality: N/A;   Family History  Problem Relation Age of Onset  . COPD Mother   . Diabetes type II Mother   . Heart disease Mother   . High Cholesterol Mother   . Lung cancer Father    Social History  Substance Use  Topics  . Smoking status: Former Smoker -- 0.30 packs/day for 5 years    Types: Cigarettes    Quit date: 07/31/1990  . Smokeless tobacco: Never Used  . Alcohol Use: No   OB History    No data available     Review of Systems  Gastrointestinal: Positive for vomiting.  All other systems reviewed and are negative.     Allergies  Review of patient's allergies indicates no known allergies.  Home Medications   Prior to Admission medications   Medication Sig Start Date End Date Taking? Authorizing Provider  bismuth subsalicylate (PEPTO-BISMOL) 262 MG/15ML suspension Take 30 mLs by mouth every 6 (six) hours as needed. 09/17/15  Yes Donne Hazel, MD  Calcium Carb-Cholecalciferol (CALCIUM 600 + D) 600-200 MG-UNIT TABS Take 2 capsules by mouth daily.   Yes Historical Provider, MD  chlorpheniramine-HYDROcodone (TUSSIONEX) 10-8 MG/5ML SUER Take 5 mLs by mouth every 12 (twelve) hours as needed for cough. 08/27/15  Yes Rondel Jumbo, PA-C  magic mouthwash w/lidocaine SOLN Take 5 mLs by mouth 3 (three) times daily as needed for mouth pain. 09/09/15  Yes Coralee Pesa Wertman, PA-C  metoprolol tartrate (LOPRESSOR) 25 MG tablet Take 1 tablet (25 mg total) by mouth 2 (two) times daily. 09/18/15  Yes Erma Heritage, PA  Multiple Vitamin (MULTIVITAMIN WITH MINERALS) TABS tablet Take 1 tablet by mouth daily.   Yes Historical Provider, MD  oxyCODONE-acetaminophen (PERCOCET/ROXICET) 5-325 MG tablet  Take 1 tablet by mouth every 6 (six) hours as needed for severe pain. 09/09/15  Yes Coralee Pesa Wertman, PA-C  pantoprazole (PROTONIX) 40 MG tablet Take 1 tablet (40 mg total) by mouth 2 (two) times daily. 09/17/15  Yes Donne Hazel, MD  potassium chloride 20 MEQ TBCR Take 20 mEq by mouth daily. 09/30/15  Yes Curt Bears, MD  prochlorperazine (COMPAZINE) 10 MG tablet Take 1 tablet (10 mg total) by mouth every 6 (six) hours as needed for nausea or vomiting. 08/08/15  Yes Curt Bears, MD  sucralfate (CARAFATE) 1  g tablet Take 1 tablet (1 g total) by mouth 4 (four) times daily -  with meals and at bedtime. 5 min before meals for radiation induced esophagitis 09/20/15  Yes Tyler Pita, MD   BP 121/83 mmHg  Pulse 105  Temp(Src) 98.1 F (36.7 C) (Oral)  Resp 16  SpO2 100% Physical Exam  Constitutional: She appears well-developed and well-nourished. No distress.  HENT:  Head: Normocephalic and atraumatic.  Mouth/Throat: Oropharynx is clear and moist. No oropharyngeal exudate.  Eyes: Conjunctivae and EOM are normal. Pupils are equal, round, and reactive to light. Right eye exhibits no discharge. Left eye exhibits no discharge. No scleral icterus.  Neck: Normal range of motion. Neck supple. No JVD present. No thyromegaly present.  Cardiovascular: Regular rhythm, normal heart sounds and intact distal pulses.  Exam reveals no gallop and no friction rub.   No murmur heard. Tachycardia  Pulmonary/Chest: Effort normal and breath sounds normal. No respiratory distress. She has no wheezes. She has no rales.  Abdominal: Soft. Bowel sounds are normal. She exhibits no distension and no mass. There is no tenderness.  Musculoskeletal: Normal range of motion. She exhibits no edema or tenderness.  Lymphadenopathy:    She has no cervical adenopathy.  Neurological: She is alert. Coordination normal.  Skin: Skin is warm and dry. No rash noted. No erythema.  Psychiatric: She has a normal mood and affect. Her behavior is normal.  Nursing note and vitals reviewed.   ED Course  Procedures (including critical care time) Labs Review Labs Reviewed  COMPREHENSIVE METABOLIC PANEL - Abnormal; Notable for the following:    Potassium 3.4 (*)    Glucose, Bld 102 (*)    Calcium 8.5 (*)    All other components within normal limits  CBC WITH DIFFERENTIAL/PLATELET - Abnormal; Notable for the following:    WBC 1.7 (*)    RBC 3.02 (*)    Hemoglobin 8.9 (*)    HCT 26.3 (*)    RDW 20.5 (*)    Neutro Abs 1.4 (*)     Lymphs Abs 0.2 (*)    All other components within normal limits  URINALYSIS, ROUTINE W REFLEX MICROSCOPIC (NOT AT [facility])    Imaging Review No results found. I have personally reviewed and evaluated these images and lab results as part of my medical decision-making.   MDM   Final diagnoses:  Non-intractable vomiting with nausea, vomiting of unspecified type    The patient is persistently nauseated and continues to vomit even throughout the evaluation. She has no abdominal tenderness, clear lung sounds, no other signs of focal infection or abnormality. We'll obtain labs, urinalysis, another IV fluid bolus as well as a different antiemetic. She had minimal help with Zofran in the first liter of fluid.  Angiocath insertion Performed by: Johnna Acosta  Consent: Verbal consent obtained. Risks and benefits: risks, benefits and alternatives were discussed Time out: Immediately prior to procedure a "  time out" was called to verify the correct patient, procedure, equipment, support staff and site/side marked as required.  Preparation: Patient was prepped and draped in the usual sterile fashion.  Vein Location:  R EJ  Not Ultrasound Guided  Gauge: 20  Normal blood return and flush without difficulty Patient tolerance: Patient tolerated the procedure well with no immediate complications.   Minimal improvement with fluids - meds given Pt needs admission D/w Dr. Myna Hidalgo who will admit.  Meds given in ED:  Medications  sodium chloride 0.9 % bolus 1,000 mL (0 mLs Intravenous Stopped 10/10/15 2218)  metoCLOPramide (REGLAN) injection 10 mg (10 mg Intravenous Given 10/10/15 1918)  diphenhydrAMINE (BENADRYL) injection 25 mg (25 mg Intravenous Given 10/10/15 1917)  sodium chloride 0.9 % bolus 1,000 mL (0 mLs Intravenous Stopped 10/10/15 2321)  ondansetron (ZOFRAN) injection 4 mg (4 mg Intravenous Given 10/10/15 2158)         Noemi Chapel, MD 10/10/15 2353

## 2015-10-10 NOTE — ED Notes (Signed)
Theresa [name] seeing patient: [phone]

## 2015-10-10 NOTE — ED Notes (Signed)
IV team placed new IV.  Unable to draw blood.  Will administer fluid bolus and try to obtain blood after completion.

## 2015-10-11 ENCOUNTER — Encounter: Payer: Self-pay | Admitting: Nurse Practitioner

## 2015-10-11 ENCOUNTER — Encounter (HOSPITAL_COMMUNITY): Payer: Self-pay | Admitting: Family Medicine

## 2015-10-11 DIAGNOSIS — E860 Dehydration: Secondary | ICD-10-CM | POA: Diagnosis present

## 2015-10-11 DIAGNOSIS — K5901 Slow transit constipation: Secondary | ICD-10-CM

## 2015-10-11 DIAGNOSIS — D630 Anemia in neoplastic disease: Secondary | ICD-10-CM

## 2015-10-11 DIAGNOSIS — R112 Nausea with vomiting, unspecified: Secondary | ICD-10-CM | POA: Insufficient documentation

## 2015-10-11 LAB — CBC WITH DIFFERENTIAL/PLATELET
Basophils Absolute: 0 10*3/uL (ref 0.0–0.1)
Basophils Relative: 1 %
EOS PCT: 1 %
Eosinophils Absolute: 0 10*3/uL (ref 0.0–0.7)
HEMATOCRIT: 29.1 % — AB (ref 36.0–46.0)
Hemoglobin: 9.6 g/dL — ABNORMAL LOW (ref 12.0–15.0)
LYMPHS PCT: 12 %
Lymphs Abs: 0.3 10*3/uL — ABNORMAL LOW (ref 0.7–4.0)
MCH: 29.9 pg (ref 26.0–34.0)
MCHC: 33 g/dL (ref 30.0–36.0)
MCV: 90.7 fL (ref 78.0–100.0)
MONO ABS: 0.1 10*3/uL (ref 0.1–1.0)
MONOS PCT: 7 %
NEUTROS ABS: 1.7 10*3/uL (ref 1.7–7.7)
Neutrophils Relative %: 79 %
Platelets: 216 10*3/uL (ref 150–400)
RBC: 3.21 MIL/uL — ABNORMAL LOW (ref 3.87–5.11)
RDW: 20.7 % — AB (ref 11.5–15.5)
WBC: 2.1 10*3/uL — ABNORMAL LOW (ref 4.0–10.5)

## 2015-10-11 LAB — GLUCOSE, CAPILLARY: GLUCOSE-CAPILLARY: 116 mg/dL — AB (ref 65–99)

## 2015-10-11 LAB — BASIC METABOLIC PANEL
Anion gap: 10 (ref 5–15)
BUN: 8 mg/dL (ref 6–20)
CALCIUM: 9.4 mg/dL (ref 8.9–10.3)
CO2: 22 mmol/L (ref 22–32)
CREATININE: 0.82 mg/dL (ref 0.44–1.00)
Chloride: 109 mmol/L (ref 101–111)
GFR calc Af Amer: >60 mL/min (ref >60)
GFR calc non Af Amer: >60 mL/min (ref >60)
GLUCOSE: 132 mg/dL — AB (ref 65–99)
Potassium: 3.3 mmol/L — ABNORMAL LOW (ref 3.5–5.1)
Sodium: 141 mmol/L (ref 135–145)

## 2015-10-11 LAB — APTT: aPTT: 29 seconds (ref 24–37)

## 2015-10-11 LAB — PROTIME-INR
INR: 1.18 (ref 0.00–1.49)
Prothrombin Time: 15.2 seconds (ref 11.6–15.2)

## 2015-10-11 LAB — MAGNESIUM: Magnesium: 1.7 mg/dL (ref 1.7–2.4)

## 2015-10-11 LAB — OCCULT BLOOD X 1 CARD TO LAB, STOOL: Fecal Occult Bld: NEGATIVE

## 2015-10-11 LAB — BRAIN NATRIURETIC PEPTIDE: B Natriuretic Peptide: 13.2 pg/mL (ref 0.0–100.0)

## 2015-10-11 MED ORDER — ACETAMINOPHEN 325 MG PO TABS
650.0000 mg | ORAL_TABLET | Freq: Four times a day (QID) | ORAL | Status: DC | PRN
Start: 2015-10-11 — End: 2015-10-13

## 2015-10-11 MED ORDER — POTASSIUM CHLORIDE IN NACL 40-0.9 MEQ/L-% IV SOLN
INTRAVENOUS | Status: AC
  Administered 2015-10-11 (×2): 100 mL/h via INTRAVENOUS
  Filled 2015-10-11 (×3): qty 1000

## 2015-10-11 MED ORDER — ACETAMINOPHEN 650 MG RE SUPP: 650.0000 mg | Freq: Four times a day (QID) | RECTAL | Status: DC | PRN

## 2015-10-11 MED ORDER — ALUM & MAG HYDROXIDE-SIMETH 200-200-20 MG/5ML PO SUSP: 30.0000 mL | Freq: Four times a day (QID) | ORAL | Status: DC | PRN

## 2015-10-11 MED ORDER — DEXAMETHASONE SODIUM PHOSPHATE 10 MG/ML IJ SOLN
10.0000 mg | Freq: Once | INTRAMUSCULAR | Status: AC
  Administered 2015-10-11: 10 mg via INTRAVENOUS
  Filled 2015-10-11: qty 1

## 2015-10-11 MED ORDER — METOCLOPRAMIDE HCL 5 MG/ML IJ SOLN
10.0000 mg | Freq: Four times a day (QID) | INTRAMUSCULAR | Status: DC
  Administered 2015-10-11 – 2015-10-13 (×11): 10 mg via INTRAVENOUS
  Filled 2015-10-11 (×14): qty 2

## 2015-10-11 MED ORDER — PANTOPRAZOLE SODIUM 40 MG IV SOLR
40.0000 mg | INTRAVENOUS | Status: DC
Start: 2015-10-11 — End: 2015-10-13
  Administered 2015-10-11 – 2015-10-12 (×3): 40 mg via INTRAVENOUS
  Filled 2015-10-11 (×3): qty 40

## 2015-10-11 MED ORDER — ONDANSETRON HCL 4 MG PO TABS: 8.0000 mg | ORAL_TABLET | Freq: Three times a day (TID) | ORAL | Status: DC | PRN

## 2015-10-11 MED ORDER — ENOXAPARIN SODIUM 60 MG/0.6ML ~~LOC~~ SOLN
50.0000 mg | SUBCUTANEOUS | Status: DC
  Administered 2015-10-11: 50 mg via SUBCUTANEOUS
  Filled 2015-10-11 (×3): qty 0.6

## 2015-10-11 MED ORDER — CALCIUM CARBONATE-VITAMIN D 500-200 MG-UNIT PO TABS
2.0000 | ORAL_TABLET | Freq: Every day | ORAL | Status: DC
  Administered 2015-10-12 – 2015-10-13 (×2): 2 via ORAL
  Filled 2015-10-11 (×4): qty 2

## 2015-10-11 MED ORDER — DOCUSATE SODIUM 100 MG PO CAPS
100.0000 mg | ORAL_CAPSULE | Freq: Two times a day (BID) | ORAL | Status: DC
  Administered 2015-10-11 – 2015-10-13 (×4): 100 mg via ORAL
  Filled 2015-10-11 (×6): qty 1

## 2015-10-11 MED ORDER — MORPHINE SULFATE (PF) 2 MG/ML IV SOLN
2.0000 mg | INTRAVENOUS | Status: DC | PRN
  Administered 2015-10-11 – 2015-10-12 (×5): 2 mg via INTRAVENOUS
  Filled 2015-10-11 (×5): qty 1

## 2015-10-11 MED ORDER — CALCIUM CARB-CHOLECALCIFEROL 600-200 MG-UNIT PO TABS: 2.0000 | ORAL_TABLET | Freq: Every day | ORAL | Status: DC

## 2015-10-11 MED ORDER — SODIUM CHLORIDE 0.9 % IJ SOLN
3.0000 mL | Freq: Two times a day (BID) | INTRAMUSCULAR | Status: DC
  Administered 2015-10-11 – 2015-10-12 (×4): 3 mL via INTRAVENOUS

## 2015-10-11 MED ORDER — METOPROLOL TARTRATE 25 MG PO TABS
12.5000 mg | ORAL_TABLET | Freq: Two times a day (BID) | ORAL | Status: DC
  Administered 2015-10-11 – 2015-10-13 (×6): 12.5 mg via ORAL
  Filled 2015-10-11 (×7): qty 1

## 2015-10-11 MED ORDER — ADULT MULTIVITAMIN W/MINERALS CH
1.0000 | ORAL_TABLET | Freq: Every day | ORAL | Status: DC
  Administered 2015-10-12 – 2015-10-13 (×2): 1 via ORAL
  Filled 2015-10-11 (×3): qty 1

## 2015-10-11 MED ORDER — BISACODYL 5 MG PO TBEC
10.0000 mg | DELAYED_RELEASE_TABLET | Freq: Once | ORAL | Status: AC
  Administered 2015-10-11: 10 mg via ORAL
  Filled 2015-10-11: qty 2

## 2015-10-11 MED ORDER — HYDROCOD POLST-CPM POLST ER 10-8 MG/5ML PO SUER: 5.0000 mL | Freq: Two times a day (BID) | ORAL | Status: DC | PRN

## 2015-10-11 MED ORDER — MAGIC MOUTHWASH W/LIDOCAINE
5.0000 mL | Freq: Three times a day (TID) | ORAL | Status: DC | PRN
  Filled 2015-10-11: qty 5

## 2015-10-11 MED ORDER — SUCRALFATE 1 G PO TABS
1.0000 g | ORAL_TABLET | Freq: Three times a day (TID) | ORAL | Status: DC
  Administered 2015-10-11 – 2015-10-13 (×8): 1 g via ORAL
  Filled 2015-10-11 (×11): qty 1

## 2015-10-11 MED ORDER — SODIUM CHLORIDE 0.9 % IV SOLN
8.0000 mg | Freq: Four times a day (QID) | INTRAVENOUS | Status: DC | PRN
  Administered 2015-10-11 (×2): 8 mg via INTRAVENOUS
  Filled 2015-10-11 (×5): qty 4

## 2015-10-11 MED ORDER — OXYCODONE-ACETAMINOPHEN 5-325 MG PO TABS
1.0000 | ORAL_TABLET | Freq: Four times a day (QID) | ORAL | Status: DC | PRN
  Administered 2015-10-12: 2 via ORAL
  Filled 2015-10-11: qty 2

## 2015-10-11 NOTE — H&P (Signed)
[facility] History and Physical  THERESE ROCCO RFF:[id number] DOB: Feb 12, 1961 DOA: 10/10/2015  Referring physician: ED physician PCP: No PCP Per Patient  Specialists:  Dr. Julien Nordmann (med onc), Dr. Tammi Klippel (rad onc)   Chief Complaint: Nausea and vomiting   HPI: Theresa [name] is a 55 y.o. female with PMH of non-small cell lung cancer, stage III, who presents to the ED with nausea and vomiting, onset 3 days ago following chemotherapy. Ms. Mcneff was diagnosed with non-small cell lung cancer in mid October 2016 after failing to improve with treatment for pneumonia. She started radiation and chemotherapy with paclitaxel and carboplatin on 08/19/2015 inches completed her last session on 10/07/2015. She tolerated all sessions without incident until developing severe nausea and vomiting shortly after the last treatment. She denies fevers, chills, chest pain, palpitations, or diarrhea. She reports constant nausea and nonbloody nonbilious vomiting more than 10 times daily. She has been unable to tolerate any PO intake since the evening of 10/07/2015. There has been no associated abdominal pain, dysuria, or flank pain. Her urine output is decreased, but there is no increased urinary urgency or frequency.  In ED, patient was found to be afebrile, saturating well on room air with tachycardia to 120.  Initial blood works notable for a white blood cell count 1700 and hemoglobin of 8.9. A mild hypokalemia is also noted. Patient was bolused 2 L of normal saline in the emergency department and treated with Zofran, Reglan, and Benadryl. Despite this treatment, patient's nausea and vomiting persisted and she failed PO challenge. She will be admitted to the hospital for ongoing evaluation and management of chemotherapy-induced nausea and vomiting.  Where does patient live?   At home    Can patient participate in ADLs?  Yes         Review of Systems:   General: no fevers, chills, sweats, weight change,  poor appetite, or fatigue HEENT: no blurry vision, hearing changes or sore throat Pulm: no dyspnea, cough, or wheeze CV: no chest pain or palpitations Abd:  Nausea and vomiting. No abdominal pain or diarrhea.  Constipation.  GU: no dysuria, hematuria, increased urinary frequency, or urgency. Decreased UOP.   Ext: no leg edema Neuro: no focal weakness, numbness, or tingling, no vision change or hearing loss Skin: no rash, no wounds MSK: No muscle spasm, no deformity, no red, hot, or swollen joint Heme: No easy bruising or bleeding Travel history: No recent long distant travel    Allergy: No Known Allergies  Past Medical History  Diagnosis Date  . Pneumonia   . Cancer ([place])     5 years ago - cervical cancer  . Non-small cell carcinoma of lung, stage 3 ([place]) 08/08/2015  . Encounter for antineoplastic chemotherapy 08/27/2015  . Paroxysmal atrial flutter ([place]) 09/16/2015  . Hypokalemia 09/30/2015    Past Surgical History  Procedure Laterality Date  . Wisdom tooth extraction    . Tubal ligation    . Video bronchoscopy with endobronchial ultrasound N/A 08/02/2015    Procedure: VIDEO BRONCHOSCOPY WITH ENDOBRONCHIAL ULTRASOUND;  Surgeon: [name], MD;  Location: [facility];  Service: Thoracic;  Laterality: N/A;    Social History:  reports that she quit smoking about 25 years ago. Her smoking use included Cigarettes. She has a 1.5 pack-year smoking history. She has never used smokeless tobacco. She reports that she does not drink alcohol or use illicit drugs.  Family History:  Family History  Problem Relation Age of Onset  . COPD Mother   .  Diabetes type II Mother   . Heart disease Mother   . High Cholesterol Mother   . Lung cancer Father      Prior to Admission medications   Medication Sig Start Date End Date Taking? Authorizing Provider  bismuth subsalicylate (PEPTO-BISMOL) 262 MG/15ML suspension Take 30 mLs by mouth every 6 (six) hours as needed. 09/17/15  Yes Donne Hazel, MD  Calcium Carb-Cholecalciferol (CALCIUM 600 + D) 600-200 MG-UNIT TABS Take 2 capsules by mouth daily.   Yes Historical Provider, MD  chlorpheniramine-HYDROcodone (TUSSIONEX) 10-8 MG/5ML SUER Take 5 mLs by mouth every 12 (twelve) hours as needed for cough. 08/27/15  Yes Rondel Jumbo, PA-C  magic mouthwash w/lidocaine SOLN Take 5 mLs by mouth 3 (three) times daily as needed for mouth pain. 09/09/15  Yes Coralee Pesa Wertman, PA-C  metoprolol tartrate (LOPRESSOR) 25 MG tablet Take 1 tablet (25 mg total) by mouth 2 (two) times daily. 09/18/15  Yes Erma Heritage, PA  Multiple Vitamin (MULTIVITAMIN WITH MINERALS) TABS tablet Take 1 tablet by mouth daily.   Yes Historical Provider, MD  oxyCODONE-acetaminophen (PERCOCET/ROXICET) 5-325 MG tablet Take 1 tablet by mouth every 6 (six) hours as needed for severe pain. 09/09/15  Yes Coralee Pesa Wertman, PA-C  pantoprazole (PROTONIX) 40 MG tablet Take 1 tablet (40 mg total) by mouth 2 (two) times daily. 09/17/15  Yes Donne Hazel, MD  potassium chloride 20 MEQ TBCR Take 20 mEq by mouth daily. 09/30/15  Yes Curt Bears, MD  prochlorperazine (COMPAZINE) 10 MG tablet Take 1 tablet (10 mg total) by mouth every 6 (six) hours as needed for nausea or vomiting. 08/08/15  Yes Curt Bears, MD  sucralfate (CARAFATE) 1 g tablet Take 1 tablet (1 g total) by mouth 4 (four) times daily -  with meals and at bedtime. 5 min before meals for radiation induced esophagitis 09/20/15  Yes Tyler Pita, MD    Physical Exam: Filed Vitals:   10/10/15 1734 10/10/15 2041 10/10/15 2207 10/10/15 2354  BP: 112/82 123/86 121/83 143/83  Pulse: 119 99 105 101  Temp: 98.1 F (36.7 C)     TempSrc: Oral     Resp: '22 16 16 20  '$ SpO2: 98% 100% 100% 99%   General: Not in acute distress, but obvious discomfort  HEENT:       Eyes: PERRL, EOMI, no scleral icterus or conjunctival pallor.       ENT: No discharge from the ears or nose, no pharyngeal ulcers, petechiae or exudate, no  tonsillar enlargement.        Neck: No JVD, no bruit, no appreciable mass Heme: No cervical adenopathy, no pallor Cardiac: Rate ~100 and regular with hyperdynamic precordium, No murmurs, No gallops or rubs. Pulm: Good air movement bilaterally. No rales, wheezing, rhonchi or rubs. Abd: Soft, nondistended, nontender, no rebound pain or gaurding, no mass or organomegaly, BS present. Ext: No LE edema bilaterally. 2+DP/PT pulse bilaterally. Musculoskeletal: No gross deformity, no red, hot, swollen joints, no limitation in ROM  Skin: No rashes or wounds on exposed surfaces  Neuro: Alert, oriented X3, cranial nerves II-XII grossly intact. No focal findings Psych: Patient is not overtly psychotic, appropriate mood and affect.  Labs on Admission:  Basic Metabolic Panel:  Recent Labs Lab 10/07/15 1333 10/10/15 1225 10/10/15 2249  NA 139 138 139  K 3.5 3.4* 3.4*  CL  --   --  110  CO2 '23 22 23  '$ GLUCOSE 108 119 102*  BUN 6.0* 6.5* 7  CREATININE 0.8 0.8 0.59  CALCIUM 9.7 10.0 8.5*   Liver Function Tests:  Recent Labs Lab 10/07/15 1333 10/10/15 1225 10/10/15 2249  AST '18 15 17  '$ ALT '14 13 15  '$ ALKPHOS 82 78 60  BILITOT 0.61 1.05 1.0  PROT 7.8 8.2 6.6  ALBUMIN 3.8 4.0 3.5   No results for input(s): LIPASE, AMYLASE in the last 168 hours. No results for input(s): AMMONIA in the last 168 hours. CBC:  Recent Labs Lab 10/07/15 1333 10/10/15 1225 10/10/15 2249  WBC 1.9* 1.7* 1.7*  NEUTROABS 1.1* 1.4* 1.4*  HGB 10.7* 11.1* 8.9*  HCT 31.8* 33.4* 26.3*  MCV 87.6 88.7 87.1  PLT 216 239 194   Cardiac Enzymes: No results for input(s): CKTOTAL, CKMB, CKMBINDEX, TROPONINI in the last 168 hours.  BNP (last 3 results) No results for input(s): BNP in the last 8760 hours.  ProBNP (last 3 results) No results for input(s): PROBNP in the last 8760 hours.  CBG: No results for input(s): GLUCAP in the last 168 hours.  Radiological Exams on Admission: No results found.  EKG:   Ordered  and pending.    Assessment/Plan  1. Intractable nausea and vomiting, chemotherapy-induced  - Unable to tolerate PO since 10/07/15  - Likely secondary to carboplatin  - Has not responded to Zofran initially in ED, will try increased dose, compazine; EKG to check QTc, monitor on tele   - If sxs persist, olanzapine may be helpful in this setting  - Received 2 L NS bolus in ED, will continue IVF with NS at 100 cc/hr  - Clear liquid diet available, will advance as quickly as she can tolerate    2. Anemia, leukopenia  - Hgb 8.9, down from apparent baseline of 11 - No sign of active blood loss, suspect this is chemo-induced - WBC 1.7k; has been low since starting chemo at end of November, reaching nadir of 1.2  - S/p treatment with Granix by Dr. Julien Nordmann 09/30/15  - Will monitor    3. Constipation  - Pt complains of constipation, unable to move bowels since 10/06/15  - Treat with 10 mg Dulcolax x1 now, Colace 100 mg BID  - Escalate regimen to effect   4. Hypokalemia - Replacing in IVF  - Repeat chem panel tomorrow       DVT ppx:  SQ Lovenox      Code Status: Full code Family Communication: None at bed side.              Disposition Plan: Admit to inpatient   Date of Service 10/11/2015    Vianne Bulls, MD [facility] Pager [phone]  If 7PM-7AM, please contact night-coverage [email or web address] Password [email or web address] 10/11/2015, 12:16 AM

## 2015-10-11 NOTE — Care Management Note (Signed)
Case Management Note  Patient Details  Name: DORETTA REMMERT MRN: [id number] Date of Birth: 04/11/1961  Subjective/Objective:  55 y/o f admitted w/n/v. GN:FAOZ Ca, s/p chemo. From home.                  Action/Plan:d/c plan home.   Expected Discharge Date:   (unknown)               Expected Discharge Plan:  Home/Self Care  In-House Referral:     Discharge planning Services  CM Consult  Post Acute Care Choice:    Choice offered to:     DME Arranged:    DME Agency:     HH Arranged:    HH Agency:     Status of Service:  In process, will continue to follow  Medicare Important Message Given:    Date Medicare IM Given:    Medicare IM give by:    Date Additional Medicare IM Given:    Additional Medicare Important Message give by:     If discussed at [place] of Stay Meetings, dates discussed:    Additional Comments:  Dessa Phi, RN 10/11/2015, 2:00 PM

## 2015-10-11 NOTE — Progress Notes (Signed)
Initial Nutrition Assessment  DOCUMENTATION CODES:   Obesity unspecified, Non-severe (moderate) malnutrition in context of acute illness/injury  INTERVENTION:   -Boost Plus TID, 360 kcal, 14 g Protein per bottle upon diet advancement -RD will monitor for diet advancement -Continue to monitor for nutritional needs   NUTRITION DIAGNOSIS:   Inadequate oral intake related to cancer and cancer related treatments, chronic illness, vomiting, poor appetite as evidenced by per patient/family report.  GOAL:   Patient will meet greater than or equal to 90% of their needs  MONITOR:   PO intake, I & O's, Diet advancement, Weight trends, Labs  REASON FOR ASSESSMENT:   Malnutrition Screening Tool, Consult  ASSESSMENT:  Pt has a PMH of non-small cell lung cancer, stage III, who presents to the ED with nausea and vomiting, onset 3 days ago following chemotherapy. Ms. Sween was diagnosed with non-small cell lung cancer in mid October 2016 after failing to improve with treatment for pneumonia. She started radiation and chemotherapy with paclitaxel and carboplatin on 08/19/2015 inches completed her last session on 10/07/2015. She tolerated all sessions without incident until developing severe nausea and vomiting shortly after the last treatment. She denies fevers, chills, chest pain, palpitations, or diarrhea. She reports constant nausea and nonbloody nonbilious vomiting more than 10 times daily. She has been unable to tolerate any PO intake since the evening of 10/07/2015. There has been no associated abdominal pain, dysuria, or flank pain. Her urine output is decreased, but there is no increased urinary urgency or frequency.  Pt was seen for MST. Pt states that her appetite has declined in the past 3 days. She states that she has not ate anything for 3 days. Pt reports that if she consumes anything it would lead to emesis. Pt states she is having dry heaving because there is nothing in her stomach.  Pt is on a CLD, she has not consumed anything since admission. Pt states taste alterations since beginning chemotherapy, sensitive to sugary items. [place] to being admitted she reports that her appetite at home was good, she later reports that her appetite would diminish during meals. Pt reports that she drinks Boost at home with every meal. Offered to provide her Boost once her diet advances, she prefers the vanilla flavor. Pt requests yogurt but informed her once she is able to tolerate CLD she will be able to order yogurt upon diet advancement.  Pt reports weight loss since being admitted. Per chart review she has had an 8% (16 lb) weight loss within 1 month, which is significant for time frame. Conducted nutrition focused physical exam and found no muscle/ fat wasting. Pt has non-sever malnutrition in the context of acute illness.   Pt asked what her weight goals should be during treatment. She was advised that weight loss is not encouraged during treatment. RD informed her that maintaining her weight is the recommendation during treatment.  Pt is not meeting energy needs at this time. Reviewed medications. Reviewed labs; potassium 3.3.   Diet Order:  Diet clear liquid Room service appropriate?: Yes; Fluid consistency:: Thin  Skin:  Reviewed, no issues  Last BM:  1/20  Height:   Ht Readings from Last 1 Encounters:  10/11/15 '5\' 8"'$  (1.727 m)    Weight:   Wt Readings from Last 1 Encounters:  10/11/15 207 lb 14.4 oz (94.303 kg)    Ideal Body Weight:  64 kg  BMI:  Body mass index is 31.62 kg/(m^2).  Estimated Nutritional Needs:   Kcal:  1027-2536  Protein:  105-115  Fluid:  >/= 2 L  EDUCATION NEEDS:   No education needs identified at this time  Raford Pitcher, Dietetic Intern Pager: [phone]

## 2015-10-11 NOTE — Progress Notes (Signed)
Patient requested for Chemotherapy precautions to be taken off her door due to health confidentiality from family and visitors.  Margarita Grizzle with house coverage was notified who spoke with risk management regarding patient request.  According to policy we have to continue precautions for staff, patient, and family safety.  This was explained to patient and daughter who is involved in patient's care in detail.  Chemotherapy precaution sign to remain on patient's door with yellow bin inside bathroom.

## 2015-10-11 NOTE — Assessment & Plan Note (Signed)
Patient received her last/final cycle of Taxol/carboplatin chemotherapy on 10/07/2015.    patient is scheduled to return on 11/11/2015 for labs and a follow-up visit.

## 2015-10-11 NOTE — Assessment & Plan Note (Signed)
Patient received her last /final cycle of Taxol/carboplatin chemotherapy on 10/07/2015. She had tolerated her chemotherapy fairly well; with minimal GI symptoms.  However, patient states that she has developed significant nausea, vomiting, and dehydration.  Within the past 3 days.  She states she has been vomiting between 10 and 11 times each day for the past 3 days. She feels very fatigued and weak.  She has been trying both Zofran and Compazine at home with minimal effectiveness.   On exam today.  Patient does appear very weak and is constantly vomiting or dry heaving even during the exam.    patient received a total of 1.5 L normal saline IV fluid rehydration while at the [facility]; and continued to feel nauseous, and attempt to vomit. She was also given Zofran and Ativan IV; with minimal effectiveness.  Patient will be transported to the emergency department for further evaluation and management today.  Briefly, history report were called to the emergency department charge nurse; prior to transporting the patient to the emergency department via wheelchair.  Per the cancer Center nurse.

## 2015-10-11 NOTE — Progress Notes (Signed)
Patient seen and evaluated earlier this a.m. please refer to H&P for details regarding assessment and plan.  Patient feels about the same currently. We'll add Decadron given that patient is having nausea and vomiting secondary to chemotherapy.  Gen.: Patient in no acute distress, alert and awake Cardiovascular: S1 and S2 within normal limits, no rubs Pulmonary: Speaking in full sentences, equal chest rise, no wheezes Abdomen: Nondistended, soft, nontender  Will reassess next am.  Velvet Bathe

## 2015-10-11 NOTE — Assessment & Plan Note (Signed)
Patient received her last /final cycle of Taxol/carboplatin chemotherapy on 10/07/2015. She had tolerated her chemotherapy fairly well; with minimal GI symptoms.  However, patient states that she has developed significant nausea, vomiting, and dehydration.  Within the past 3 days.  She states she has been vomiting between 10 and 11 times each day for the past 3 days. She feels very fatigued and weak.  She has been trying both Zofran and Compazine at home with minimal effectiveness.   On exam today.  Patient does appear very weak and is constantly vomiting or dry heaving even during the exam.    patient received a total of 1.5 L normal saline IV fluid rehydration while at the [facility]; and continued to feel nauseous, and attempt to vomit. She was also given Zofran and Ativan IV; with minimal effectiveness.  Patient will be transported to the emergency department for further evaluation and management today.  Briefly, history report were called to the emergency department charge nurse; prior to transporting the patient to the emergency department via wheelchair.  Per the cancer Center nurse.

## 2015-10-11 NOTE — Progress Notes (Signed)
SYMPTOM MANAGEMENT CLINIC   HPI: Theresa [name] 55 y.o. female diagnosed with lung cancer.  Patient just completed 5 cycles of Taxol/carboplatin chemotherapy regimen.    Patient received her last /final cycle of Taxol/carboplatin chemotherapy on 10/07/2015. She had tolerated her chemotherapy fairly well; with minimal GI symptoms.  However, patient states that she has developed significant nausea, vomiting, and dehydration.  Within the past 3 days.  She states she has been vomiting between 10 and 11 times each day for the past 3 days. She feels very fatigued and weak.  She has been trying both Zofran and Compazine at home with minimal effectiveness.   On exam today.  Patient does appear very weak and is constantly vomiting or dry heaving even during the exam.    patient received a total of 1.5 L normal saline IV fluid rehydration while at the [facility]; and continued to feel nauseous, and attempt to vomit. She was also given Zofran and Ativan IV; with minimal effectiveness.  Patient will be transported to the emergency department for further evaluation and management today.  Briefly, history report were called to the emergency department charge nurse; prior to transporting the patient to the emergency department via wheelchair.  Per the cancer Center nurse.  HPI  Review of Systems  Constitutional: Positive for malaise/fatigue.  Gastrointestinal: Positive for nausea and vomiting. Negative for abdominal pain and diarrhea.  Neurological: Positive for weakness.  All other systems reviewed and are negative.   Past Medical History  Diagnosis Date  . Pneumonia   . Cancer ([place])     5 years ago - cervical cancer  . Non-small cell carcinoma of lung, stage 3 ([place]) 08/08/2015  . Encounter for antineoplastic chemotherapy 08/27/2015  . Paroxysmal atrial flutter ([place]) 09/16/2015  . Hypokalemia 09/30/2015    Past Surgical History  Procedure Laterality Date  . Wisdom tooth extraction      . Tubal ligation    . Video bronchoscopy with endobronchial ultrasound N/A 08/02/2015    Procedure: VIDEO BRONCHOSCOPY WITH ENDOBRONCHIAL ULTRASOUND;  Surgeon: [name], MD;  Location: [facility];  Service: Thoracic;  Laterality: N/A;    has Lung mass; Non-small cell carcinoma of lung, stage 3 ([place]); Malignant neoplasm of cervix ([place]); Encounter for antineoplastic chemotherapy; Chest pain; Tachycardia; Pain in the chest; Malnutrition of moderate degree; Typical atrial flutter ([place]); Anemia in neoplastic disease; Antineoplastic chemotherapy induced pancytopenia ([place]); Constipation; Intractable nausea and vomiting; Malignant neoplasm of overlapping sites of right lung ([place]); Chemotherapy induced neutropenia ([place]); Hypokalemia; Dehydration; and Nausea with vomiting on her problem list.    has No Known Allergies.    Medication List       This list is accurate as of: 10/10/15  5:28 PM.  Always use your most recent med list.               bismuth subsalicylate 709 GG/83MO suspension  Commonly known as:  PEPTO-BISMOL  Take 30 mLs by mouth every 6 (six) hours as needed.     CALCIUM 600 + D 600-200 MG-UNIT Tabs  Generic drug:  Calcium Carb-Cholecalciferol  Take 2 capsules by mouth daily.     chlorpheniramine-HYDROcodone 10-8 MG/5ML Suer  Commonly known as:  TUSSIONEX  Take 5 mLs by mouth every 12 (twelve) hours as needed for cough.     magic mouthwash w/lidocaine Soln  Take 5 mLs by mouth 3 (three) times daily as needed for mouth pain.     metoprolol tartrate 25 MG tablet  Commonly known as:  LOPRESSOR  Take 1 tablet (25 mg total) by mouth 2 (two) times daily.     multivitamin with minerals Tabs tablet  Take 1 tablet by mouth daily.     oxyCODONE-acetaminophen 5-325 MG tablet  Commonly known as:  PERCOCET/ROXICET  Take 1 tablet by mouth every 6 (six) hours as needed for severe pain.     pantoprazole 40 MG tablet  Commonly known as:  PROTONIX  Take 1 tablet (40 mg total)  by mouth 2 (two) times daily.     Potassium Chloride ER 20 MEQ Tbcr  Take 20 mEq by mouth daily.     prochlorperazine 10 MG tablet  Commonly known as:  COMPAZINE  Take 1 tablet (10 mg total) by mouth every 6 (six) hours as needed for nausea or vomiting.     RADIAPLEX EX  Apply topically.     sucralfate 1 g tablet  Commonly known as:  CARAFATE  Take 1 tablet (1 g total) by mouth 4 (four) times daily -  with meals and at bedtime. 5 min before meals for radiation induced esophagitis         PHYSICAL EXAMINATION  Oncology Vitals 10/11/2015 10/11/2015  Height - 173 cm  Weight - 94.303 kg  Weight (lbs) - 207 lbs 14 oz  BMI (kg/m2) - 31.61 kg/m2  Temp 98.5 98.7  Pulse 93 99  Resp - 18  SpO2 99 97  BSA (m2) - 2.13 m2   BP Readings from Last 2 Encounters:  10/11/15 122/85  10/10/15 105/83    Physical Exam  Constitutional: She is oriented to person, place, and time. She appears dehydrated. She appears unhealthy. She has a sickly appearance.  HENT:  Head: Normocephalic and atraumatic.  Eyes: Conjunctivae and EOM are normal. Pupils are equal, round, and reactive to light. Right eye exhibits no discharge. Left eye exhibits no discharge. No scleral icterus.  Neck: Normal range of motion.  Pulmonary/Chest: Effort normal. No respiratory distress.  Abdominal: She exhibits no distension and no mass. There is no tenderness. There is no rebound and no guarding.  Musculoskeletal: Normal range of motion. She exhibits no edema or tenderness.  Neurological: She is alert and oriented to person, place, and time. Gait normal.  Skin: Skin is warm and dry. No rash noted. No erythema. There is pallor.  Psychiatric: Affect normal.  Nursing note and vitals reviewed.   LABORATORY DATA:. Admission on 10/10/2015  Component Date Value Ref Range Status  . Color, Urine 10/10/2015 YELLOW  YELLOW Final  . APPearance 10/10/2015 CLEAR  CLEAR Final  . Specific Gravity, Urine 10/10/2015 1.012  1.005 -  1.030 Final  . pH 10/10/2015 6.5  5.0 - 8.0 Final  . Glucose, UA 10/10/2015 NEGATIVE  NEGATIVE mg/dL Final  . Hgb urine dipstick 10/10/2015 NEGATIVE  NEGATIVE Final  . Bilirubin Urine 10/10/2015 NEGATIVE  NEGATIVE Final  . Ketones, ur 10/10/2015 NEGATIVE  NEGATIVE mg/dL Final  . Protein, ur 10/10/2015 NEGATIVE  NEGATIVE mg/dL Final  . Nitrite 10/10/2015 NEGATIVE  NEGATIVE Final  . Leukocytes, UA 10/10/2015 NEGATIVE  NEGATIVE Final   MICROSCOPIC NOT DONE ON URINES WITH NEGATIVE PROTEIN, BLOOD, LEUKOCYTES, NITRITE, OR GLUCOSE <1000 mg/dL.  Marland Kitchen Sodium 10/10/2015 139  135 - 145 mmol/L Final  . Potassium 10/10/2015 3.4* 3.5 - 5.1 mmol/L Final  . Chloride 10/10/2015 110  101 - 111 mmol/L Final  . CO2 10/10/2015 23  22 - 32 mmol/L Final  . Glucose, Bld 10/10/2015 102* 65 -  99 mg/dL Final  . BUN 10/10/2015 7  6 - 20 mg/dL Final  . Creatinine, Ser 10/10/2015 0.59  0.44 - 1.00 mg/dL Final  . Calcium 10/10/2015 8.5* 8.9 - 10.3 mg/dL Final  . Total Protein 10/10/2015 6.6  6.5 - 8.1 g/dL Final  . Albumin 10/10/2015 3.5  3.5 - 5.0 g/dL Final  . AST 10/10/2015 17  15 - 41 U/L Final  . ALT 10/10/2015 15  14 - 54 U/L Final  . Alkaline Phosphatase 10/10/2015 60  38 - 126 U/L Final  . Total Bilirubin 10/10/2015 1.0  0.3 - 1.2 mg/dL Final  . GFR calc non Af Amer 10/10/2015 >60  >60 mL/min Final  . GFR calc Af Amer 10/10/2015 >60  >60 mL/min Final   Comment: (NOTE) The eGFR has been calculated using the CKD EPI equation. This calculation has not been validated in all clinical situations. eGFR's persistently <60 mL/min signify possible Chronic Kidney Disease.   . Anion gap 10/10/2015 6  5 - 15 Final  . WBC 10/10/2015 1.7* 4.0 - 10.5 K/uL Final  . RBC 10/10/2015 3.02* 3.87 - 5.11 MIL/uL Final  . Hemoglobin 10/10/2015 8.9* 12.0 - 15.0 g/dL Final  . HCT 10/10/2015 26.3* 36.0 - 46.0 % Final  . MCV 10/10/2015 87.1  78.0 - 100.0 fL Final  . MCH 10/10/2015 29.5  26.0 - 34.0 pg Final  . MCHC 10/10/2015 33.8   30.0 - 36.0 g/dL Final  . RDW 10/10/2015 20.5* 11.5 - 15.5 % Final  . Platelets 10/10/2015 194  150 - 400 K/uL Final  . Neutrophils Relative % 10/10/2015 82   Final  . Neutro Abs 10/10/2015 1.4* 1.7 - 7.7 K/uL Final  . Lymphocytes Relative 10/10/2015 12   Final  . Lymphs Abs 10/10/2015 0.2* 0.7 - 4.0 K/uL Final  . Monocytes Relative 10/10/2015 6   Final  . Monocytes Absolute 10/10/2015 0.1  0.1 - 1.0 K/uL Final  . Eosinophils Relative 10/10/2015 0   Final  . Eosinophils Absolute 10/10/2015 0.0  0.0 - 0.7 K/uL Final  . Basophils Relative 10/10/2015 0   Final  . Basophils Absolute 10/10/2015 0.0  0.0 - 0.1 K/uL Final  . Magnesium 10/11/2015 1.7  1.7 - 2.4 mg/dL Final  . WBC 10/11/2015 2.1* 4.0 - 10.5 K/uL Final  . RBC 10/11/2015 3.21* 3.87 - 5.11 MIL/uL Final  . Hemoglobin 10/11/2015 9.6* 12.0 - 15.0 g/dL Final  . HCT 10/11/2015 29.1* 36.0 - 46.0 % Final  . MCV 10/11/2015 90.7  78.0 - 100.0 fL Final  . MCH 10/11/2015 29.9  26.0 - 34.0 pg Final  . MCHC 10/11/2015 33.0  30.0 - 36.0 g/dL Final  . RDW 10/11/2015 20.7* 11.5 - 15.5 % Final  . Platelets 10/11/2015 216  150 - 400 K/uL Final  . Neutrophils Relative % 10/11/2015 79   Final  . Neutro Abs 10/11/2015 1.7  1.7 - 7.7 K/uL Final  . Lymphocytes Relative 10/11/2015 12   Final  . Lymphs Abs 10/11/2015 0.3* 0.7 - 4.0 K/uL Final  . Monocytes Relative 10/11/2015 7   Final  . Monocytes Absolute 10/11/2015 0.1  0.1 - 1.0 K/uL Final  . Eosinophils Relative 10/11/2015 1   Final  . Eosinophils Absolute 10/11/2015 0.0  0.0 - 0.7 K/uL Final  . Basophils Relative 10/11/2015 1   Final  . Basophils Absolute 10/11/2015 0.0  0.0 - 0.1 K/uL Final  . Sodium 10/11/2015 141  135 - 145 mmol/L Final  . Potassium 10/11/2015 3.3*  3.5 - 5.1 mmol/L Final  . Chloride 10/11/2015 109  101 - 111 mmol/L Final  . CO2 10/11/2015 22  22 - 32 mmol/L Final  . Glucose, Bld 10/11/2015 132* 65 - 99 mg/dL Final  . BUN 10/11/2015 8  6 - 20 mg/dL Final  . Creatinine, Ser  10/11/2015 0.82  0.44 - 1.00 mg/dL Final  . Calcium 10/11/2015 9.4  8.9 - 10.3 mg/dL Final  . GFR calc non Af Amer 10/11/2015 >60  >60 mL/min Final  . GFR calc Af Amer 10/11/2015 >60  >60 mL/min Final   Comment: (NOTE) The eGFR has been calculated using the CKD EPI equation. This calculation has not been validated in all clinical situations. eGFR's persistently <60 mL/min signify possible Chronic Kidney Disease.   . Anion gap 10/11/2015 10  5 - 15 Final  . Fecal Occult Bld 10/11/2015 NEGATIVE  NEGATIVE Final  . Prothrombin Time 10/11/2015 15.2  11.6 - 15.2 seconds Final  . INR 10/11/2015 1.18  0.00 - 1.49 Final  . aPTT 10/11/2015 29  24 - 37 seconds Final  . B Natriuretic Peptide 10/11/2015 13.2  0.0 - 100.0 pg/mL Final  . Glucose-Capillary 10/11/2015 116* 65 - 99 mg/dL Final  . Comment 1 10/11/2015 Notify RN   Final  . Comment 2 10/11/2015 Document in Chart   Final  Appointment on 10/10/2015  Component Date Value Ref Range Status  . WBC 10/10/2015 1.7* 3.9 - 10.3 10e3/uL Final  . NEUT# 10/10/2015 1.4* 1.5 - 6.5 10e3/uL Final  . HGB 10/10/2015 11.1* 11.6 - 15.9 g/dL Final  . HCT 10/10/2015 33.4* 34.8 - 46.6 % Final  . Platelets 10/10/2015 239  145 - 400 10e3/uL Final  . MCV 10/10/2015 88.7  79.5 - 101.0 fL Final  . MCH 10/10/2015 29.4  25.1 - 34.0 pg Final  . MCHC 10/10/2015 33.2  31.5 - 36.0 g/dL Final  . RBC 10/10/2015 3.76  3.70 - 5.45 10e6/uL Final  . RDW 10/10/2015 22.8* 11.2 - 14.5 % Final  . lymph# 10/10/2015 0.2* 0.9 - 3.3 10e3/uL Final  . MONO# 10/10/2015 0.2  0.1 - 0.9 10e3/uL Final  . Eosinophils Absolute 10/10/2015 0.0  0.0 - 0.5 10e3/uL Final  . Basophils Absolute 10/10/2015 0.0  0.0 - 0.1 10e3/uL Final  . NEUT% 10/10/2015 77.8* 38.4 - 76.8 % Final  . LYMPH% 10/10/2015 12.5* 14.0 - 49.7 % Final  . MONO% 10/10/2015 9.1  0.0 - 14.0 % Final  . EOS% 10/10/2015 0.1  0.0 - 7.0 % Final  . BASO% 10/10/2015 0.5  0.0 - 2.0 % Final  . Sodium 10/10/2015 138  136 - 145 mEq/L  Final  . Potassium 10/10/2015 3.4* 3.5 - 5.1 mEq/L Final  . Chloride 10/10/2015 105  98 - 109 mEq/L Final  . CO2 10/10/2015 22  22 - 29 mEq/L Final  . Glucose 10/10/2015 119  70 - 140 mg/dl Final   Glucose reference range is for nonfasting patients. Fasting glucose reference range is 70- 100.  Marland Kitchen BUN 10/10/2015 6.5* 7.0 - 26.0 mg/dL Final  . Creatinine 10/10/2015 0.8  0.6 - 1.1 mg/dL Final  . Total Bilirubin 10/10/2015 1.05  0.20 - 1.20 mg/dL Final  . Alkaline Phosphatase 10/10/2015 78  40 - 150 U/L Final  . AST 10/10/2015 15  5 - 34 U/L Final  . ALT 10/10/2015 13  0 - 55 U/L Final  . Total Protein 10/10/2015 8.2  6.4 - 8.3 g/dL Final  . Albumin 10/10/2015 4.0  3.5 - 5.0 g/dL Final  . Calcium 10/10/2015 10.0  8.4 - 10.4 mg/dL Final  . Anion Gap 10/10/2015 11  3 - 11 mEq/L Final  . EGFR 10/10/2015 84* >90 ml/min/1.73 m2 Final   eGFR is calculated using the CKD-EPI Creatinine Equation (2009)     RADIOGRAPHIC STUDIES: No results found.  ASSESSMENT/PLAN:    Dehydration  Patient received her last /final cycle of Taxol/carboplatin chemotherapy on 10/07/2015. She had tolerated her chemotherapy fairly well; with minimal GI symptoms.  However, patient states that she has developed significant nausea, vomiting, and dehydration.  Within the past 3 days.  She states she has been vomiting between 10 and 11 times each day for the past 3 days. She feels very fatigued and weak.  She has been trying both Zofran and Compazine at home with minimal effectiveness.   On exam today.  Patient does appear very weak and is constantly vomiting or dry heaving even during the exam.    patient received a total of 1.5 L normal saline IV fluid rehydration while at the [facility]; and continued to feel nauseous, and attempt to vomit. She was also given Zofran and Ativan IV; with minimal effectiveness.  Patient will be transported to the emergency department for further evaluation and management today.  Briefly,  history report were called to the emergency department charge nurse; prior to transporting the patient to the emergency department via wheelchair.  Per the cancer Center nurse.  Nausea with vomiting  Patient received her last /final cycle of Taxol/carboplatin chemotherapy on 10/07/2015. She had tolerated her chemotherapy fairly well; with minimal GI symptoms.  However, patient states that she has developed significant nausea, vomiting, and dehydration.  Within the past 3 days.  She states she has been vomiting between 10 and 11 times each day for the past 3 days. She feels very fatigued and weak.  She has been trying both Zofran and Compazine at home with minimal effectiveness.   On exam today.  Patient does appear very weak and is constantly vomiting or dry heaving even during the exam.    patient received a total of 1.5 L normal saline IV fluid rehydration while at the [facility]; and continued to feel nauseous, and attempt to vomit. She was also given Zofran and Ativan IV; with minimal effectiveness.  Patient will be transported to the emergency department for further evaluation and management today.  Briefly, history report were called to the emergency department charge nurse; prior to transporting the patient to the emergency department via wheelchair.  Per the cancer Center nurse.  Non-small cell carcinoma of lung, stage 3 ([place])  Patient received her last/final cycle of Taxol/carboplatin chemotherapy on 10/07/2015.    patient is scheduled to return on 11/11/2015 for labs and a follow-up visit.  Patient stated understanding of all instructions; and was in agreement with this plan of care. The patient knows to call the clinic with any problems, questions or concerns.   Review/collaboration with Dr. Julien Nordmann regarding all aspects of patient's visit today.   Total time spent with patient was 25 minutes;  with greater than 75 percent of that time spent in face to face counseling regarding  patient's symptoms,  and coordination of care and follow up.  Disclaimer:This dictation was prepared with Dragon/digital dictation along with [employer]. Any transcriptional errors that result from this process are unintentional.  Drue Second, NP 10/11/2015

## 2015-10-12 DIAGNOSIS — E876 Hypokalemia: Secondary | ICD-10-CM

## 2015-10-12 DIAGNOSIS — T451X5A Adverse effect of antineoplastic and immunosuppressive drugs, initial encounter: Secondary | ICD-10-CM

## 2015-10-12 DIAGNOSIS — R1110 Vomiting, unspecified: Secondary | ICD-10-CM

## 2015-10-12 DIAGNOSIS — D701 Agranulocytosis secondary to cancer chemotherapy: Secondary | ICD-10-CM

## 2015-10-12 LAB — GLUCOSE, CAPILLARY: GLUCOSE-CAPILLARY: 116 mg/dL — AB (ref 65–99)

## 2015-10-12 MED ORDER — POTASSIUM CHLORIDE CRYS ER 20 MEQ PO TBCR
40.0000 meq | EXTENDED_RELEASE_TABLET | Freq: Two times a day (BID) | ORAL | Status: DC
  Administered 2015-10-12 – 2015-10-13 (×2): 40 meq via ORAL
  Filled 2015-10-12 (×2): qty 2

## 2015-10-12 MED ORDER — POTASSIUM CHLORIDE 20 MEQ/15ML (10%) PO SOLN
40.0000 meq | Freq: Two times a day (BID) | ORAL | Status: DC
  Administered 2015-10-12: 40 meq via ORAL
  Filled 2015-10-12 (×2): qty 30

## 2015-10-12 MED ORDER — MAGNESIUM SULFATE 2 GM/50ML IV SOLN
2.0000 g | Freq: Once | INTRAVENOUS | Status: AC
  Administered 2015-10-12: 2 g via INTRAVENOUS
  Filled 2015-10-12: qty 50

## 2015-10-12 MED ORDER — PROMETHAZINE HCL 25 MG/ML IJ SOLN
12.5000 mg | Freq: Four times a day (QID) | INTRAMUSCULAR | Status: DC | PRN
Start: 2015-10-12 — End: 2015-10-13

## 2015-10-12 MED ORDER — PROMETHAZINE HCL 25 MG PO TABS: 12.5000 mg | ORAL_TABLET | Freq: Four times a day (QID) | ORAL | Status: DC | PRN

## 2015-10-12 MED ORDER — PROMETHAZINE HCL 25 MG RE SUPP: 12.5000 mg | Freq: Four times a day (QID) | RECTAL | Status: DC | PRN

## 2015-10-12 MED ORDER — ENOXAPARIN SODIUM 40 MG/0.4ML ~~LOC~~ SOLN
40.0000 mg | SUBCUTANEOUS | Status: DC
  Administered 2015-10-12 – 2015-10-13 (×2): 40 mg via SUBCUTANEOUS
  Filled 2015-10-12 (×2): qty 0.4

## 2015-10-12 NOTE — Progress Notes (Signed)
[facility] PROGRESS NOTE    Progress Note   Theresa [name] HGD:[id number] DOB: December 16, 1960 DOA: 10/10/2015 PCP: No PCP Per Patient   Brief Narrative:   Theresa [name] is an 55 y.o. female past medical history of non-small cell lung cancer stage III hypertensive ED with nausea vomiting that started 3 days ago following chemotherapy.  Assessment/Plan:   Intractable nausea and vomiting: Patient place nothing by mouth he is not responding to Zofran in the emergency room. AddPhenergan DC Decadron. Patient seems to be tolerating clear liquid diet, advance to regular diet.  Non-small cell carcinoma of lung, stage 3 (H[facility]) Follow-up with oncology as an outpatient.  Anemia in neoplastic disease/ Chemotherapy induced neutropenia ([facility]) No signs of active bleeding continue to monitor with blood cell and hemoglobin.  Hypokalemia Replete orally and magnesium as well recheck in the morning.     DVT Prophylaxis - Lovenox ordered.  Family Communication: none Disposition Plan: Home when stable. Code Status:     Code Status Orders        Start     Ordered   10/11/15 0010  Full code   Continuous     10/11/15 0016    Code Status History    Date Active Date Inactive Code Status Order ID Comments User Context   09/16/2015 10:39 PM 09/19/2015  3:29 PM Full Code [id number]  Rise Patience, MD Inpatient        IV Access:    Peripheral IV   Procedures and diagnostic studies:   No results found.   Medical Consultants:    None.  Anti-Infectives:   Anti-infectives    None      Subjective:    Theresa [name] nauseated at times but will like to try to regular diet.  Objective:    Filed Vitals:   10/11/15 0934 10/11/15 1413 10/11/15 2102 10/12/15 0612  BP: 122/85 115/67 122/72 126/70  Pulse: 93 94 87 77  Temp: 98.5 F (36.9 C) 98.1 F (36.7 C) 98 F (36.7 C) 97.8 F (36.6 C)  TempSrc: Oral Oral Oral Oral  Resp:  '16 16 16  '$ Height:        Weight:    93.8 kg (206 lb 12.7 oz)  SpO2: 99% 99% 98% 100%    Intake/Output Summary (Last 24 hours) at 10/12/15 1222 Last data filed at 10/12/15 0534  Gross per 24 hour  Intake 1440.67 ml  Output      0 ml  Net 1440.67 ml   Filed Weights   10/11/15 0507 10/12/15 0612  Weight: 94.303 kg (207 lb 14.4 oz) 93.8 kg (206 lb 12.7 oz)    Exam: Gen:  NAD Cardiovascular:  RRR, No M/R/G Chest and lungs:   CTAB Abdomen:  Abdomen soft, NT/ND, + BS Extremities:  No C/E/C   Data Reviewed:    Labs: Basic Metabolic Panel:  Recent Labs Lab 10/07/15 1333 10/10/15 1225 10/10/15 2249 10/11/15 0110 10/11/15 0530  NA 139 138 139  --  141  K 3.5 3.4* 3.4*  --  3.3*  CL  --   --  110  --  109  CO2 '23 22 23  '$ --  22  GLUCOSE 108 119 102*  --  132*  BUN 6.0* 6.5* 7  --  8  CREATININE 0.8 0.8 0.59  --  0.82  CALCIUM 9.7 10.0 8.5*  --  9.4  MG  --   --   --  1.7  --  GFR Estimated Creatinine Clearance: 95.1 mL/min (by C-G formula based on Cr of 0.82). Liver Function Tests:  Recent Labs Lab 10/07/15 1333 10/10/15 1225 10/10/15 2249  AST '18 15 17  '$ ALT '14 13 15  '$ ALKPHOS 82 78 60  BILITOT 0.61 1.05 1.0  PROT 7.8 8.2 6.6  ALBUMIN 3.8 4.0 3.5   No results for input(s): LIPASE, AMYLASE in the last 168 hours. No results for input(s): AMMONIA in the last 168 hours. Coagulation profile  Recent Labs Lab 10/11/15 0110  INR 1.18    CBC:  Recent Labs Lab 10/07/15 1333 10/10/15 1225 10/10/15 2249 10/11/15 0530  WBC 1.9* 1.7* 1.7* 2.1*  NEUTROABS 1.1* 1.4* 1.4* 1.7  HGB 10.7* 11.1* 8.9* 9.6*  HCT 31.8* 33.4* 26.3* 29.1*  MCV 87.6 88.7 87.1 90.7  PLT 216 239 194 216   Cardiac Enzymes: No results for input(s): CKTOTAL, CKMB, CKMBINDEX, TROPONINI in the last 168 hours. BNP (last 3 results) No results for input(s): PROBNP in the last 8760 hours. CBG:  Recent Labs Lab 10/11/15 0734 10/12/15 0732  GLUCAP 116* 116*   D-Dimer: No results for input(s): DDIMER in the  last 72 hours. Hgb A1c: No results for input(s): HGBA1C in the last 72 hours. Lipid Profile: No results for input(s): CHOL, HDL, LDLCALC, TRIG, CHOLHDL, LDLDIRECT in the last 72 hours. Thyroid function studies: No results for input(s): TSH, T4TOTAL, T3FREE, THYROIDAB in the last 72 hours.  Invalid input(s): FREET3 Anemia work up: No results for input(s): VITAMINB12, FOLATE, FERRITIN, TIBC, IRON, RETICCTPCT in the last 72 hours. Sepsis Labs:  Recent Labs Lab 10/07/15 1333 10/10/15 1225 10/10/15 2249 10/11/15 0530  WBC 1.9* 1.7* 1.7* 2.1*   Microbiology No results found for this or any previous visit (from the past 240 hour(s)).   Medications:   . calcium-vitamin D  2 tablet Oral Q breakfast  . docusate sodium  100 mg Oral BID  . enoxaparin (LOVENOX) injection  40 mg Subcutaneous Q24H  . metoCLOPramide (REGLAN) injection  10 mg Intravenous 4 times per day  . metoprolol tartrate  12.5 mg Oral BID  . multivitamin with minerals  1 tablet Oral Daily  . pantoprazole (PROTONIX) IV  40 mg Intravenous Q24H  . sodium chloride  3 mL Intravenous Q12H  . sucralfate  1 g Oral TID WC & HS   Continuous Infusions:   Time spent: 25 min   LOS: 2 days   Charlynne Cousins  [facility] Pager [phone]  *Please refer to [email or web address], password [email or web address] to get updated schedule on who will round on this patient, as hospitalists switch teams weekly. If 7PM-7AM, please contact night-coverage at [email or web address], password [email or web address] for any overnight needs.  10/12/2015, 12:22 PM

## 2015-10-13 LAB — MAGNESIUM: Magnesium: 1.8 mg/dL (ref 1.7–2.4)

## 2015-10-13 LAB — GLUCOSE, CAPILLARY: GLUCOSE-CAPILLARY: 104 mg/dL — AB (ref 65–99)

## 2015-10-13 MED ORDER — ADULT MULTIVITAMIN W/MINERALS CH: 1.0000 | ORAL_TABLET | Freq: Every day | ORAL | Status: DC

## 2015-10-13 NOTE — Discharge Summary (Signed)
Physician Discharge Summary  [name] QZR:[id number] DOB: 1961-01-18 DOA: 10/10/2015  PCP: No PCP Per Patient  Admit date: 10/10/2015 Discharge date: 10/13/2015  Time spent: 35 minutes  Recommendations for Outpatient Follow-up:  1. Follow up with Oncology as an outpatient  2.    Discharge Diagnoses:  Principal Problem:   Intractable nausea and vomiting Active Problems:   Non-small cell carcinoma of lung, stage 3 (HCC)   Anemia in neoplastic disease   Constipation   Chemotherapy induced neutropenia (HCC)   Hypokalemia   Dehydration   Discharge Condition: stable  Diet recommendation: regular  Filed Weights   10/11/15 0507 10/12/15 0612 10/13/15 0455  Weight: 94.303 kg (207 lb 14.4 oz) 93.8 kg (206 lb 12.7 oz) 93.1 kg (205 lb 4 oz)    History of present illness:  55 y.o. female with PMH of non-small cell lung cancer, stage III, who presents to the ED with nausea and vomiting, onset 3 days ago following chemotherapy. Theresa [name] was diagnosed with non-small cell lung cancer in mid October 2016 after failing to improve with treatment for pneumonia. She started radiation and chemotherapy with paclitaxel and carboplatin on 08/19/2015 inches completed her last session on 10/07/2015. She tolerated all sessions without incident until developing severe nausea and vomiting shortly after the last treatment. She denies fevers, chills, chest pain, palpitations, or diarrhea. She reports constant nausea and nonbloody nonbilious vomiting more than 10 times daily. She has been unable to tolerate any PO intake since the evening of 10/07/2015. There has been no associated abdominal pain, dysuria, or flank pain. Her urine output is decreased, but there is no increased urinary urgency or frequency.  Hospital Course:  Intractable nausea and vomiting: Patient place nothing by mouth she respondto Zofran. Diet was advance. Probably viral gastroenteritis  Non-small cell carcinoma of lung, stage  3 ([place]) Follow-up with oncology as an outpatient.  Anemia in neoplastic disease/ Chemotherapy induced neutropenia (HCC) No signs of active bleeding continue to monitor with blood cell and hemoglobin.  Hypokalemia Resolved with oral repletion  Procedures:  none  Consultations:  none  Discharge Exam: Filed Vitals:   10/12/15 2245 10/13/15 0455  BP: 101/71 136/77  Pulse: 86 93  Temp: 98 F (36.7 C) 97.9 F (36.6 C)  Resp: 18 18    General: A&O x3 Cardiovascular: RRR Respiratory: good air movement CTA B/L  Discharge Instructions   Discharge Instructions    Diet - low sodium heart healthy    Complete by:  As directed      Increase activity slowly    Complete by:  As directed           Current Discharge Medication List    CONTINUE these medications which have CHANGED   Details  Multiple Vitamin (MULTIVITAMIN WITH MINERALS) TABS tablet Take 1 tablet by mouth daily. Qty: 30 tablet, Refills: 0      CONTINUE these medications which have NOT CHANGED   Details  bismuth subsalicylate (PEPTO-BISMOL) 262 MG/15ML suspension Take 30 mLs by mouth every 6 (six) hours as needed. Qty: 360 mL, Refills: 0    Calcium Carb-Cholecalciferol (CALCIUM 600 + D) 600-200 MG-UNIT TABS Take 2 capsules by mouth daily.    chlorpheniramine-HYDROcodone (TUSSIONEX) 10-8 MG/5ML SUER Take 5 mLs by mouth every 12 (twelve) hours as needed for cough. Qty: 140 mL, Refills: 0    magic mouthwash w/lidocaine SOLN Take 5 mLs by mouth 3 (three) times daily as needed for mouth pain. Qty: 240 mL, Refills: 1  metoprolol tartrate (LOPRESSOR) 25 MG tablet Take 1 tablet (25 mg total) by mouth 2 (two) times daily. Qty: 60 tablet, Refills: 3    oxyCODONE-acetaminophen (PERCOCET/ROXICET) 5-325 MG tablet Take 1 tablet by mouth every 6 (six) hours as needed for severe pain. Qty: 40 tablet, Refills: 0   Associated Diagnoses: Non-small cell carcinoma of lung, stage 3, right (HCC)    pantoprazole (PROTONIX)  40 MG tablet Take 1 tablet (40 mg total) by mouth 2 (two) times daily. Qty: 60 tablet, Refills: 0    potassium chloride 20 MEQ TBCR Take 20 mEq by mouth daily. Qty: 7 tablet, Refills: 0   Associated Diagnoses: Hypokalemia    prochlorperazine (COMPAZINE) 10 MG tablet Take 1 tablet (10 mg total) by mouth every 6 (six) hours as needed for nausea or vomiting. Qty: 30 tablet, Refills: 0   Associated Diagnoses: Non-small cell carcinoma of lung, stage 3, right (HCC)    sucralfate (CARAFATE) 1 g tablet Take 1 tablet (1 g total) by mouth 4 (four) times daily -  with meals and at bedtime. 5 min before meals for radiation induced esophagitis Qty: 60 tablet, Refills: 5   Associated Diagnoses: Primary cancer of right middle lobe of lung ([place])       No Known Allergies    The results of significant diagnostics from this hospitalization (including imaging, microbiology, ancillary and laboratory) are listed below for reference.    Significant Diagnostic Studies: Dg Chest 2 View  09/16/2015  CLINICAL DATA:  55 year old female with right-sided chest pain and a history of stage III non-small cell carcinoma of the right lung EXAM: CHEST  2 VIEW COMPARISON:  Recent prior PET-CT 08/07/2015 ; prior chest x-ray 08/01/2015 FINDINGS: Cardiac and mediastinal contours are within normal limits. Significantly decreased atelectasis and opacity in the right middle lobe compared to prior imaging. Right hilar fullness is also less conspicuous than previously seen. Stable chronic bronchitic change. Probable bibasilar subsegmental atelectasis. No pneumothorax, pleural effusion or new focal airspace consolidation. No acute osseous abnormality. IMPRESSION: 1. No acute cardiopulmonary disease. 2. Evidence of interval treatment effect with significantly less conspicuous right hilar mass and associated right middle lobe atelectasis compared to imaging from November 2016. Electronically Signed   By: Jacqulynn Cadet M.D.   On:  09/16/2015 16:34   Ct Angio Chest Pe W/cm &/or Wo Cm  09/16/2015  CLINICAL DATA:  55 year old female with chest pain and known lung cancer currently undergoing chemotherapy. EXAM: CT ANGIOGRAPHY CHEST WITH CONTRAST TECHNIQUE: Multidetector CT imaging of the chest was performed using the standard protocol during bolus administration of intravenous contrast. Multiplanar CT image reconstructions and MIPs were obtained to evaluate the vascular anatomy. CONTRAST:  169m OMNIPAQUE IOHEXOL 350 MG/ML SOLN COMPARISON:  Chest x-ray obtained earlier today ; prior PET-CT 08/07/2015 FINDINGS: Mediastinum: Decreasing mediastinal adenopathy. Index right paratracheal lymph node measures 12 mm in short axis compared to 20 mm previously. The subcarinal lymphadenopathy has also decreased in size at 17 mm in short axis compared to 20 mm previously. The right hilar mass remains a amorphous and difficult to measure discretely. However, the abnormality appears less full and there is improving aeration of the right middle lobe bronchi with a commensurate decrease in right middle lobe atelectasis. Heart/Vascular: Aberrant right subclavian artery. No aortic aneurysm or dissection. Adequate opacification of the pulmonary arteries to the proximal segmental level. No evidence of focal central filling defect to suggest acute pulmonary embolus. The heart is within normal limits for size. No pericardial effusion. Lungs/Pleura:  Significantly decreased size of the right perihilar/medial right upper lobe pulmonary mass compared to the prior PET-CT. Although difficult to measure precisely given the a amorphous configuration, the mass measures approximately 3.2 by 3.4 cm compared to 6.1 x 4.4 cm previously. Additionally, there is less atelectasis in the right upper and right middle lobe. Persistent attenuation of the right middle lobe pulmonary arterial branches. Unchanged past she had ground-glass attenuation opacity in the right lung apex of  uncertain clinical significance. No new pulmonary mass or nodule. Dependent atelectasis in both lower lobes. Bones/Soft Tissues: No acute fracture or aggressive appearing lytic or blastic osseous lesion. Upper Abdomen: Stable probable 12 mm cyst in the hepatic dome. Otherwise, visualized upper abdominal organs are unremarkable. Review of the MIP images confirms the above findings. IMPRESSION: 1. Negative for acute pulmonary embolus, pneumonia or other acute cardiopulmonary process. 2. Interval response to therapy with significantly decreased size of medial right upper lobe/right perihilar pulmonary mass and associated right hilar and mediastinal metastatic adenopathy. 3. Decreasing right upper and middle lobe atelectasis with partial re- opening of the right middle lobe bronchi. 4. Aberrant right subclavian artery again noted. 5. Additional ancillary findings as above without interval change. Electronically Signed   By: Jacqulynn Cadet M.D.   On: 09/16/2015 17:30    Microbiology: No results found for this or any previous visit (from the past 240 hour(s)).   Labs: Basic Metabolic Panel:  Recent Labs Lab 10/07/15 1333 10/10/15 1225 10/10/15 2249 10/11/15 0110 10/11/15 0530 10/13/15 0557  NA 139 138 139  --  141  --   K 3.5 3.4* 3.4*  --  3.3*  --   CL  --   --  110  --  109  --   CO2 '23 22 23  '$ --  22  --   GLUCOSE 108 119 102*  --  132*  --   BUN 6.0* 6.5* 7  --  8  --   CREATININE 0.8 0.8 0.59  --  0.82  --   CALCIUM 9.7 10.0 8.5*  --  9.4  --   MG  --   --   --  1.7  --  1.8   Liver Function Tests:  Recent Labs Lab 10/07/15 1333 10/10/15 1225 10/10/15 2249  AST '18 15 17  '$ ALT '14 13 15  '$ ALKPHOS 82 78 60  BILITOT 0.61 1.05 1.0  PROT 7.8 8.2 6.6  ALBUMIN 3.8 4.0 3.5   No results for input(s): LIPASE, AMYLASE in the last 168 hours. No results for input(s): AMMONIA in the last 168 hours. CBC:  Recent Labs Lab 10/07/15 1333 10/10/15 1225 10/10/15 2249 10/11/15 0530  WBC  1.9* 1.7* 1.7* 2.1*  NEUTROABS 1.1* 1.4* 1.4* 1.7  HGB 10.7* 11.1* 8.9* 9.6*  HCT 31.8* 33.4* 26.3* 29.1*  MCV 87.6 88.7 87.1 90.7  PLT 216 239 194 216   Cardiac Enzymes: No results for input(s): CKTOTAL, CKMB, CKMBINDEX, TROPONINI in the last 168 hours. BNP: BNP (last 3 results)  Recent Labs  10/11/15 0110  BNP 13.2    ProBNP (last 3 results) No results for input(s): PROBNP in the last 8760 hours.  CBG:  Recent Labs Lab 10/11/15 0734 10/12/15 0732 10/13/15 0750  GLUCAP 116* 116* 104*       Signed:  Charlynne Cousins MD.  [facility] 10/13/2015, 12:12 PM

## 2015-10-15 ENCOUNTER — Telehealth: Payer: Self-pay | Admitting: *Deleted

## 2015-10-15 NOTE — Telephone Encounter (Signed)
TC to pt to check status following [facility] visit on 10/10/15. Pt reports she is doing very well. She has not had any further vomiting. She is having a little difficulty with constipation. She is still going daily however some stool seems large and she has to strain. Advised pt to try Miralax BID, call us if this does not help. Pt understands to call this office with any concerns or questions.

## 2015-10-16 ENCOUNTER — Encounter: Payer: Self-pay | Admitting: Physician Assistant

## 2015-11-01 NOTE — Progress Notes (Signed)
  Radiation Oncology         [phone] ________________________________  Name: Theresa [name] MRN: [id number]  Date: 10/07/2015  DOB: 14-Dec-1960  End of Treatment Note    ICD-9-CM ICD-10-CM   1. Non-small cell carcinoma of lung, stage 3, right (HCC) 162.9 C34.91     DIAGNOSIS: [name] is a pleasant 55 y.o. 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  Beams/energy:   The patient was treated using a hybrid IMRT approach. The treatment arrangement include anterior and posterior conformal beams along with two volumetric arcs delivering a combination of 6 and 15 MV X-rays.  Narrative: The patient tolerated radiation treatment relatively well.   Denies pain associated with swallowing but, does report reflux. Reports an occasional dry cough. Reports SOB with exertion. Denies pain. Faint hyperpigmentation of chest noted.  Plan: The patient has completed radiation treatment. The patient will return to radiation oncology clinic for routine followup in one month. I advised her to call or return sooner if she has any questions or concerns related to her recovery or treatment. ________________________________  Sheral Apley. Tammi Klippel, M.D.  This document serves as a record of services personally performed by Tyler Pita, MD. It was created on his behalf by Arlyce Harman, a trained medical scribe. The creation of this record is based on the scribe's personal observations and the provider's statements to them. This document has been checked and approved by the attending provider.

## 2015-11-07 ENCOUNTER — Encounter: Payer: Self-pay | Admitting: Radiation Oncology

## 2015-11-07 ENCOUNTER — Ambulatory Visit (HOSPITAL_COMMUNITY)
Admission: RE | Admit: 2015-11-07 | Discharge: 2015-11-07 | Disposition: A | Discharge status: Home / Self Care | Payer: Medicare Other | Source: Ambulatory Visit | Attending: Internal Medicine | Admitting: Internal Medicine

## 2015-11-07 ENCOUNTER — Ambulatory Visit (HOSPITAL_COMMUNITY)
Admission: RE | Admit: 2015-11-07 | Discharge: 2015-11-07 | Disposition: A | Discharge status: Home / Self Care | Payer: Medicare Other | Source: Ambulatory Visit | Attending: Radiation Oncology | Admitting: Radiation Oncology

## 2015-11-07 ENCOUNTER — Encounter (HOSPITAL_COMMUNITY): Payer: Self-pay

## 2015-11-07 VITALS — BP 120/67 | HR 101 | Resp 16 | Wt 214.9 lb

## 2015-11-07 DIAGNOSIS — I779 Disorder of arteries and arterioles, unspecified: Secondary | ICD-10-CM | POA: Insufficient documentation

## 2015-11-07 DIAGNOSIS — D701 Agranulocytosis secondary to cancer chemotherapy: Secondary | ICD-10-CM | POA: Diagnosis not present

## 2015-11-07 DIAGNOSIS — R918 Other nonspecific abnormal finding of lung field: Secondary | ICD-10-CM | POA: Diagnosis not present

## 2015-11-07 DIAGNOSIS — C342 Malignant neoplasm of middle lobe, bronchus or lung: Secondary | ICD-10-CM

## 2015-11-07 DIAGNOSIS — C3481 Malignant neoplasm of overlapping sites of right bronchus and lung: Secondary | ICD-10-CM

## 2015-11-07 DIAGNOSIS — Z9221 Personal history of antineoplastic chemotherapy: Secondary | ICD-10-CM | POA: Diagnosis not present

## 2015-11-07 DIAGNOSIS — T451X5A Adverse effect of antineoplastic and immunosuppressive drugs, initial encounter: Secondary | ICD-10-CM | POA: Diagnosis not present

## 2015-11-07 DIAGNOSIS — R590 Localized enlarged lymph nodes: Secondary | ICD-10-CM | POA: Diagnosis not present

## 2015-11-07 DIAGNOSIS — R911 Solitary pulmonary nodule: Secondary | ICD-10-CM | POA: Diagnosis not present

## 2015-11-07 DIAGNOSIS — K449 Diaphragmatic hernia without obstruction or gangrene: Secondary | ICD-10-CM | POA: Insufficient documentation

## 2015-11-07 MED ORDER — IOHEXOL 300 MG/ML  SOLN
75.0000 mL | Freq: Once | INTRAMUSCULAR | Status: AC | PRN
  Administered 2015-11-07: 75 mL via INTRAVENOUS

## 2015-11-07 MED ORDER — HYDROCOD POLST-CPM POLST ER 10-8 MG/5ML PO SUER: 5.0000 mL | Freq: Two times a day (BID) | ORAL | Status: DC | PRN

## 2015-11-07 NOTE — Progress Notes (Signed)
Patient here for one month follow up with Dr. Tammi Klippel. Patient unaccompanied. Patient states, "I feel great." Weight and vitals stable. Denies pain. Reports SOB is much less. Reports a rare dry cough. Denies difficulty swallowing. Hyperpigmentation on right upper back and chest almost completely resolved. Reports applying coco butter daily to these areas. Reports here energy level is up.   BP 120/67 mmHg  Pulse 101  Resp 16  Wt 214 lb 14.4 oz (97.478 kg)  SpO2 100% Wt Readings from Last 3 Encounters:  11/07/15 214 lb 14.4 oz (97.478 kg)  10/13/15 205 lb 4 oz (93.1 kg)  10/04/15 211 lb 9.6 oz (95.981 kg)

## 2015-11-07 NOTE — Progress Notes (Signed)
Radiation Oncology         [phone] ________________________________  Name: Theresa [name] MRN: [id number]  Date: 11/07/2015  DOB: 08-31-1961  Follow-Up Visit Note  CC: No PCP Per [name]  Curt Bears, MD  Diagnosis:    ICD-9-CM ICD-10-CM   1. Malignant neoplasm of overlapping sites of right lung ([place]) 162.8 C34.81 chlorpheniramine-HYDROcodone (TUSSIONEX) 10-8 MG/5ML SUER  2. Non-small cell carcinoma of lung, stage 3 (HCC) 162.4 C34.2 chlorpheniramine-HYDROcodone (TUSSIONEX) 10-8 MG/5ML SUER     Theresa [name] is a pleasant 55 y.o. 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  08/19/2015-10/07/2015: Theresa [name]'s primary tumor and involved lymph nodes were treated to 66 Gy in 30 fractions.  ~2011: Cervical cancer at [facility]  Narrative:  Theresa [name] returns today for routine follow-up. Theresa [name] stats that she feels great. Denies pain. Reports SOB is much less. Reports a rare dry cough. Denies difficulty swallowing. Hyperpigmentation on right upper back and chest almost completely resolved. Reports applying cocoa butter daily to these areas. Reports her energy level is up.  ALLERGIES:  has No Known Allergies.  Meds: Current Outpatient Prescriptions  Medication Sig Dispense Refill  . metoprolol tartrate (LOPRESSOR) 25 MG tablet Take 1 tablet (25 mg total) by mouth 2 (two) times daily. 60 tablet 3  . pantoprazole (PROTONIX) 40 MG tablet Take 1 tablet (40 mg total) by mouth 2 (two) times daily. 60 tablet 0  . bismuth subsalicylate (PEPTO-BISMOL) 262 MG/15ML suspension Take 30 mLs by mouth every 6 (six) hours as needed. ([name] not taking: Reported on 11/07/2015) 360 mL 0  . Calcium Carb-Cholecalciferol (CALCIUM 600 + D) 600-200 MG-UNIT TABS Take 2 capsules by mouth daily. Reported on 11/07/2015    . chlorpheniramine-HYDROcodone (TUSSIONEX) 10-8 MG/5ML SUER Take 5 mLs by mouth every 12 (twelve) hours as needed for  cough. 140 mL 0  . magic mouthwash w/lidocaine SOLN Take 5 mLs by mouth 3 (three) times daily as needed for mouth pain. ([name] not taking: Reported on 11/07/2015) 240 mL 1  . Multiple Vitamin (MULTIVITAMIN WITH MINERALS) TABS tablet Take 1 tablet by mouth daily. ([name] not taking: Reported on 11/07/2015) 30 tablet 0  . oxyCODONE-acetaminophen (PERCOCET/ROXICET) 5-325 MG tablet Take 1 tablet by mouth every 6 (six) hours as needed for severe pain. ([name] not taking: Reported on 11/07/2015) 40 tablet 0  . potassium chloride 20 MEQ TBCR Take 20 mEq by mouth daily. ([name] not taking: Reported on 11/07/2015) 7 tablet 0  . prochlorperazine (COMPAZINE) 10 MG tablet Take 1 tablet (10 mg total) by mouth every 6 (six) hours as needed for nausea or vomiting. ([name] not taking: Reported on 11/07/2015) 30 tablet 0  . sucralfate (CARAFATE) 1 g tablet Take 1 tablet (1 g total) by mouth 4 (four) times daily -  with meals and at bedtime. 5 min before meals for radiation induced esophagitis ([name] not taking: Reported on 11/07/2015) 60 tablet 5   No current facility-administered medications for this encounter.    Physical Findings: Theresa [name] is in no acute distress. [name] is alert and oriented.  weight is 214 lb 14.4 oz (97.478 kg). Her blood pressure is 120/67 and her pulse is 101. Her respiration is 16 and oxygen saturation is 100%.  No significant changes.  Lab Findings: Lab Results  Component Value Date   WBC 2.1* 10/11/2015   WBC 1.7* 10/10/2015   HGB 9.6* 10/11/2015   HGB 11.1* 10/10/2015   HCT 29.1*  10/11/2015   HCT 33.4* 10/10/2015   PLT 216 10/11/2015   PLT 239 10/10/2015    Lab Results  Component Value Date   NA 141 10/11/2015   NA 138 10/10/2015   K 3.3* 10/11/2015   K 3.4* 10/10/2015   CHLORIDE 105 10/10/2015   CO2 22 10/11/2015   CO2 22 10/10/2015   GLUCOSE 132* 10/11/2015   GLUCOSE 119 10/10/2015   BUN 8 10/11/2015   BUN 6.5* 10/10/2015   CREATININE 0.82 10/11/2015    CREATININE 0.8 10/10/2015   BILITOT 1.0 10/10/2015   BILITOT 1.05 10/10/2015   ALKPHOS 60 10/10/2015   ALKPHOS 78 10/10/2015   AST 17 10/10/2015   AST 15 10/10/2015   ALT 15 10/10/2015   ALT 13 10/10/2015   PROT 6.6 10/10/2015   PROT 8.2 10/10/2015   ALBUMIN 3.5 10/10/2015   ALBUMIN 4.0 10/10/2015   CALCIUM 9.4 10/11/2015   CALCIUM 10.0 10/10/2015   ANIONGAP 10 10/11/2015   ANIONGAP 11 10/10/2015    Radiographic Findings: Ct Chest W Contrast  11/07/2015  CLINICAL DATA:  Invasive poorly differentiated squamous cell lung carcinoma of Theresa right upper lobe status post chemotherapy, presenting for restaging. Additional history of cervical cancer. EXAM: CT CHEST WITH CONTRAST TECHNIQUE: Multidetector CT imaging of Theresa chest was performed during intravenous contrast administration. CONTRAST:  68m OMNIPAQUE IOHEXOL 300 MG/ML  SOLN COMPARISON:  09/16/2015 chest CT angiogram. FINDINGS: Mediastinum/Nodes: Normal heart size. No pericardial fluid/thickening. Re- demonstrated is an aberrant right subclavian artery arising from Theresa distal aortic arch with retroesophageal course and no aneurysmal dilatation. Normal caliber thoracic aorta and pulmonary arteries. No central pulmonary emboli. Normal visualized thyroid. Normal esophagus. No axillary adenopathy. No residual pathologically enlarged right paratracheal nodes. Mildly enlarged 1.2 cm subcarinal node (series 2/ image 24), decreased from 1.7 cm. Mildly enlarged 1.0 cm right infrahilar node (series 2/ image 29), decreased from 1.5 cm. No new pathologically enlarged mediastinal or hilar nodes. Lungs/Pleura: No pneumothorax. New trace layering right pleural effusion. No left pleural effusion. Central right upper lobe 2.8 x 2.1 cm pulmonary nodule (series 2/ image 23) is decreased from 3.4 x 3.2 cm. There is improved aeration of Theresa right upper lobe with mild residual atelectasis/pneumonitis in Theresa basilar right upper lobe. Patchy reticulation and  ground-glass opacity in Theresa anterior and posterior apical right upper lobe is probably inflammatory and is new anteriorly and stable posteriorly. There is improved aeration of Theresa right middle lobe with persistent moderate atelectasis/ pneumonitis of Theresa right middle lobe. Right upper lobe 3 mm pulmonary nodule (series 5/image 22) is stable. There is improved aeration in Theresa right lower lobe. Mild patchy ground-glass opacity in Theresa right lower lobe is likely inflammatory. No acute consolidative airspace disease or new significant pulmonary nodules. Upper abdomen: Small hiatal hernia. Stable 1.3 cm and 1.0 cm liver cysts at Theresa liver dome. At least 4 additional subcentimeter hypodense liver lesions scattered throughout Theresa visualized liver, too small to characterize, not definitely changed. Musculoskeletal:  No aggressive appearing focal osseous lesions. IMPRESSION: 1. Further treatment response, with decreased central right upper lobe mass and decreased mild residual right hilar and mediastinal lymphadenopathy. No new or progressive metastatic disease. 2. Stable 3 mm right upper lobe pulmonary nodule. 3. Improved aeration of Theresa right lung. Mild patchy ground-glass opacities throughout Theresa right lung are probably inflammatory and can be reassessed on follow-up chest CT. 4. New trace layering right pleural effusion. 5. Chronic findings include average right subclavian artery and small hiatal hernia.  Electronically Signed   By: Ilona Sorrel M.D.   On: 11/07/2015 12:52    Impression:  Theresa [name] is recovering from Theresa effects of radiation.  CT later today  Plan: I have refilled her Tussinex.  Follow-up with medical oncology.  _____________________________________  Sheral Apley Tammi Klippel, M.D.  This document serves as a record of services personally performed by Tyler Pita, MD. It was created on his behalf by Darcus Austin, a trained medical scribe. Theresa creation of this record is based on Theresa scribe's  personal observations and Theresa provider's statements to them. This document has been checked and approved by Theresa attending provider.

## 2015-11-07 NOTE — Progress Notes (Signed)
  Radiation Oncology         [phone]) [phone] ________________________________  Name: Theresa [name] MRN: [id number]  Date: 11/07/2015  DOB: 1961/05/29  This patient has completed radiation is eligible to volunteer in the hospital.  Sheral Apley. Tammi Klippel, M.D.

## 2015-11-11 ENCOUNTER — Telehealth: Payer: Self-pay | Admitting: *Deleted

## 2015-11-11 ENCOUNTER — Encounter: Payer: Self-pay | Admitting: Internal Medicine

## 2015-11-11 ENCOUNTER — Other Ambulatory Visit (HOSPITAL_BASED_OUTPATIENT_CLINIC_OR_DEPARTMENT_OTHER): Payer: Medicare Other

## 2015-11-11 ENCOUNTER — Ambulatory Visit (HOSPITAL_BASED_OUTPATIENT_CLINIC_OR_DEPARTMENT_OTHER): Payer: Medicare Other | Admitting: Internal Medicine

## 2015-11-11 ENCOUNTER — Other Ambulatory Visit: Payer: Self-pay | Admitting: Radiation Oncology

## 2015-11-11 ENCOUNTER — Telehealth: Payer: Self-pay | Admitting: Internal Medicine

## 2015-11-11 VITALS — BP 142/86 | HR 117 | Temp 98.0°F | Resp 18 | Ht 68.0 in | Wt 214.6 lb

## 2015-11-11 DIAGNOSIS — C3491 Malignant neoplasm of unspecified part of right bronchus or lung: Secondary | ICD-10-CM

## 2015-11-11 DIAGNOSIS — C3401 Malignant neoplasm of right main bronchus: Secondary | ICD-10-CM

## 2015-11-11 DIAGNOSIS — T451X5A Adverse effect of antineoplastic and immunosuppressive drugs, initial encounter: Principal | ICD-10-CM

## 2015-11-11 DIAGNOSIS — C3481 Malignant neoplasm of overlapping sites of right bronchus and lung: Secondary | ICD-10-CM

## 2015-11-11 DIAGNOSIS — C342 Malignant neoplasm of middle lobe, bronchus or lung: Secondary | ICD-10-CM

## 2015-11-11 DIAGNOSIS — D701 Agranulocytosis secondary to cancer chemotherapy: Secondary | ICD-10-CM

## 2015-11-11 LAB — COMPREHENSIVE METABOLIC PANEL
ALT: 9 U/L (ref 0–55)
AST: 13 U/L (ref 5–34)
Albumin: 3.6 g/dL (ref 3.5–5.0)
Alkaline Phosphatase: 96 U/L (ref 40–150)
Anion Gap: 9 mEq/L (ref 3–11)
BUN: 7.5 mg/dL (ref 7.0–26.0)
CHLORIDE: 109 meq/L (ref 98–109)
CO2: 24 mEq/L (ref 22–29)
Calcium: 9.6 mg/dL (ref 8.4–10.4)
Creatinine: 0.9 mg/dL (ref 0.6–1.1)
EGFR: 89 mL/min/{1.73_m2} — ABNORMAL LOW (ref >90)
GLUCOSE: 108 mg/dL (ref 70–140)
POTASSIUM: 3.3 meq/L — AB (ref 3.5–5.1)
Sodium: 142 mEq/L (ref 136–145)
Total Bilirubin: 0.57 mg/dL (ref 0.20–1.20)
Total Protein: 7.5 g/dL (ref 6.4–8.3)

## 2015-11-11 LAB — CBC WITH DIFFERENTIAL/PLATELET
BASO%: 0.4 % (ref 0.0–2.0)
BASOS ABS: 0 10*3/uL (ref 0.0–0.1)
EOS%: 0.8 % (ref 0.0–7.0)
Eosinophils Absolute: 0 10*3/uL (ref 0.0–0.5)
HEMATOCRIT: 31.3 % — AB (ref 34.8–46.6)
HEMOGLOBIN: 10.5 g/dL — AB (ref 11.6–15.9)
LYMPH#: 0.5 10*3/uL — AB (ref 0.9–3.3)
LYMPH%: 19 % (ref 14.0–49.7)
MCH: 31.7 pg (ref 25.1–34.0)
MCHC: 33.5 g/dL (ref 31.5–36.0)
MCV: 94.6 fL (ref 79.5–101.0)
MONO#: 0.3 10*3/uL (ref 0.1–0.9)
MONO%: 12.7 % (ref 0.0–14.0)
NEUT#: 1.7 10*3/uL (ref 1.5–6.5)
NEUT%: 67.1 % (ref 38.4–76.8)
Platelets: 201 10*3/uL (ref 145–400)
RBC: 3.31 10*6/uL — ABNORMAL LOW (ref 3.70–5.45)
RDW: 20.8 % — AB (ref 11.2–14.5)
WBC: 2.5 10*3/uL — ABNORMAL LOW (ref 3.9–10.3)

## 2015-11-11 MED ORDER — HYDROCOD POLST-CPM POLST ER 10-8 MG/5ML PO SUER: 5.0000 mL | Freq: Two times a day (BID) | ORAL | Status: DC | PRN

## 2015-11-11 NOTE — Telephone Encounter (Signed)
Pt confirmed appt and received avs, Reviewed calendar and contact information

## 2015-11-11 NOTE — Telephone Encounter (Signed)
Per staff message and POF I have scheduled appts. Advised scheduler of appts. JMW  

## 2015-11-11 NOTE — Progress Notes (Signed)
[place] [phone]   [phone] Multidisciplinary thoracic oncology clinic  OFFICE PROGRESS NOTE  No PCP Per Patient No address on file  DIAGNOSIS: Stage IIIA (T2b, N2, M0) non-small cell lung cancer, invasive poorly differentiated squamous cell carcinoma diagnosed in November 2016 and presented with large right hilar mass with collapse of the right middle lobe and extension into the right upper lobe and right lower lobe along the hilum with right paratracheal and subcarinal lymphadenopathy. PDL 1 testing is still pending.  PRIOR THERAPY: Concurrent chemoradiation with weekly carboplatin for AUC of 2 and paclitaxel 45 MG/M2. First dose 08/19/2015. Status post 5 cycles. Last cycle was given 10/07/2015 with significant response.   CURRENT THERAPY: Consolidation chemotherapy with carboplatin for AUC of 5 and paclitaxel 175 MG/M2 every 3 weeks with Neulasta support. First dose 11/18/2015.  INTERVAL HISTORY: Theresa [name] 55 y.o. female returns to the clinic today for follow-up visit. The patient is feeling much better today with no specific complaints. She denied having any significant weight loss or night sweats. She has no chest pain, shortness breath, cough or hemoptysis. She has no nausea or vomiting, no fever or chills. She had repeat CT scan of the chest performed recently and she is here for evaluation and discussion of her scan results.  MEDICAL HISTORY: Past Medical History  Diagnosis Date  . Pneumonia   . Cancer ([place])     5 years ago - cervical cancer  . Non-small cell carcinoma of lung, stage 3 ([place]) 08/08/2015  . Encounter for antineoplastic chemotherapy 08/27/2015  . Paroxysmal atrial flutter ([place]) 09/16/2015  . Hypokalemia 09/30/2015    ALLERGIES:  has No Known Allergies.  MEDICATIONS:  Current Outpatient Prescriptions  Medication Sig Dispense Refill  . metoprolol tartrate (LOPRESSOR) 25 MG tablet Take 1 tablet (25 mg total)  by mouth 2 (two) times daily. 60 tablet 3  . pantoprazole (PROTONIX) 40 MG tablet Take 1 tablet (40 mg total) by mouth 2 (two) times daily. 60 tablet 0  . chlorpheniramine-HYDROcodone (TUSSIONEX) 10-8 MG/5ML SUER Take 5 mLs by mouth every 12 (twelve) hours as needed for cough. (Patient not taking: Reported on 11/11/2015) 140 mL 0  . oxyCODONE-acetaminophen (PERCOCET/ROXICET) 5-325 MG tablet Take 1 tablet by mouth every 6 (six) hours as needed for severe pain. (Patient not taking: Reported on 11/07/2015) 40 tablet 0   No current facility-administered medications for this visit.    SURGICAL HISTORY:  Past Surgical History  Procedure Laterality Date  . Wisdom tooth extraction    . Tubal ligation    . Video bronchoscopy with endobronchial ultrasound N/A 08/02/2015    Procedure: VIDEO BRONCHOSCOPY WITH ENDOBRONCHIAL ULTRASOUND;  Surgeon: [name], MD;  Location: [facility];  Service: Thoracic;  Laterality: N/A;    REVIEW OF SYSTEMS:  Constitutional: negative Eyes: negative Ears, nose, mouth, throat, and face: negative Respiratory: negative Cardiovascular: negative Gastrointestinal: negative Genitourinary:negative Integument/breast: negative Hematologic/lymphatic: negative Musculoskeletal:negative Neurological: negative Behavioral/Psych: negative Endocrine: negative Allergic/Immunologic: negative   PHYSICAL EXAMINATION: General appearance: alert, cooperative and no distress Head: Normocephalic, without obvious abnormality, atraumatic Neck: no adenopathy, no JVD, supple, symmetrical, trachea midline and thyroid not enlarged, symmetric, no tenderness/mass/nodules Lymph nodes: Cervical, supraclavicular, and axillary nodes normal. Resp: clear to auscultation bilaterally Back: symmetric, no curvature. ROM normal. No CVA tenderness. Cardio: regular rate and rhythm, S1, S2 normal, no murmur, click, rub or gallop GI: soft, non-tender; bowel sounds normal; no masses,  no  organomegaly Extremities: extremities normal,  atraumatic, no cyanosis or edema Neurologic: Alert and oriented X 3, normal strength and tone. Normal symmetric reflexes. Normal coordination and gait  ECOG PERFORMANCE STATUS: 1 - Symptomatic but completely ambulatory  Blood pressure 142/86, pulse 117, temperature 98 F (36.7 C), temperature source Oral, resp. rate 18, height '5\' 8"'$  (1.727 m), weight 214 lb 9.6 oz (97.342 kg), SpO2 100 %.  LABORATORY DATA: Lab Results  Component Value Date   WBC 2.5* 11/11/2015   HGB 10.5* 11/11/2015   HCT 31.3* 11/11/2015   MCV 94.6 11/11/2015   PLT 201 11/11/2015      Chemistry      Component Value Date/Time   NA 142 11/11/2015 1020   NA 141 10/11/2015 0530   K 3.3* 11/11/2015 1020   K 3.3* 10/11/2015 0530   CL 109 10/11/2015 0530   CO2 24 11/11/2015 1020   CO2 22 10/11/2015 0530   BUN 7.5 11/11/2015 1020   BUN 8 10/11/2015 0530   CREATININE 0.9 11/11/2015 1020   CREATININE 0.82 10/11/2015 0530      Component Value Date/Time   CALCIUM 9.6 11/11/2015 1020   CALCIUM 9.4 10/11/2015 0530   ALKPHOS 96 11/11/2015 1020   ALKPHOS 60 10/10/2015 2249   AST 13 11/11/2015 1020   AST 17 10/10/2015 2249   ALT 9 11/11/2015 1020   ALT 15 10/10/2015 2249   BILITOT 0.57 11/11/2015 1020   BILITOT 1.0 10/10/2015 2249       RADIOGRAPHIC STUDIES: Ct Chest W Contrast  11/07/2015  CLINICAL DATA:  Invasive poorly differentiated squamous cell lung carcinoma of the right upper lobe status post chemotherapy, presenting for restaging. Additional history of cervical cancer. EXAM: CT CHEST WITH CONTRAST TECHNIQUE: Multidetector CT imaging of the chest was performed during intravenous contrast administration. CONTRAST:  56m OMNIPAQUE IOHEXOL 300 MG/ML  SOLN COMPARISON:  09/16/2015 chest CT angiogram. FINDINGS: Mediastinum/Nodes: Normal heart size. No pericardial fluid/thickening. 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 Normal caliber thoracic aorta and pulmonary arteries. No central pulmonary emboli. Normal visualized thyroid. Normal esophagus. No axillary adenopathy. No residual pathologically enlarged right paratracheal nodes. Mildly enlarged 1.2 cm subcarinal node (series 2/ image 24), decreased from 1.7 cm. Mildly enlarged 1.0 cm right infrahilar node (series 2/ image 29), decreased from 1.5 cm. No new pathologically enlarged mediastinal or hilar nodes. Lungs/Pleura: No pneumothorax. New trace layering right pleural effusion. No left pleural effusion. Central right upper lobe 2.8 x 2.1 cm pulmonary nodule (series 2/ image 23) is decreased from 3.4 x 3.2 cm. There is improved aeration of the right upper lobe with mild residual atelectasis/pneumonitis in the basilar right upper lobe. Patchy reticulation and ground-glass opacity in the anterior and posterior apical right upper lobe is probably inflammatory and is new anteriorly and stable posteriorly. There is improved aeration of the right middle lobe with persistent moderate atelectasis/ pneumonitis of the right middle lobe. Right upper lobe 3 mm pulmonary nodule (series 5/image 22) is stable. There is improved aeration in the right lower lobe. Mild patchy ground-glass opacity in the right lower lobe is likely inflammatory. No acute consolidative airspace disease or new significant pulmonary nodules. Upper abdomen: Small hiatal hernia. Stable 1.3 cm and 1.0 cm liver cysts at the liver dome. At least 4 additional subcentimeter hypodense liver lesions scattered throughout the visualized liver, too small to characterize, not definitely changed. Musculoskeletal:  No aggressive appearing focal osseous lesions. IMPRESSION: 1. Further  treatment response, with decreased central right upper lobe mass and decreased mild residual right hilar and mediastinal lymphadenopathy. No new or progressive metastatic disease. 2. Stable 3  mm right upper lobe pulmonary nodule. 3. Improved aeration of the right lung. Mild patchy ground-glass opacities throughout the right lung are probably inflammatory and can be reassessed on follow-up chest CT. 4. New trace layering right pleural effusion. 5. Chronic findings include average right subclavian artery and small hiatal hernia. Electronically Signed   By: Ilona Sorrel M.D.   On: 11/07/2015 12:52    ASSESSMENT AND PLAN: This is a very pleasant 55 years old African-American female recently diagnosed with a stage IIIa non-small cell lung cancer, squamous cell carcinoma with large right hilar mass in addition to mediastinal lymphadenopathy diagnosed in November 2016. The patient completed a course of concurrent chemoradiation with weekly carboplatin and paclitaxel status post 5 cycles.  She tolerated her treatment well and the recent CT scan of the chest showed significant improvement of her disease. I discussed the scan results and showed the images to the patient today. I gave her the option of continuous observation and close monitoring versus proceeding with 3 cycles of consolidation chemotherapy with carboplatin for AUC of 5 and paclitaxel 175 MG/M2 every 3 weeks with Neulasta support. The patient is interested in the consolidation chemotherapy. I discussed with her the adverse effect of this treatment. She is expected to start the first cycle of this treatment on 11/18/2015. The patient would come back for follow-up visit in 4 weeks for reevaluation before starting cycle #2. She was advised to call immediately if she has any concerning symptoms in the interval. The patient voices understanding of current disease status and treatment options and is in agreement with the current care plan.  All questions were answered. The patient knows to call the clinic with any problems, questions or concerns. We can certainly see the patient much sooner if necessary.  Disclaimer: This note was dictated  with voice recognition software. Similar sounding words can inadvertently be transcribed and may not be corrected upon review.

## 2015-11-18 ENCOUNTER — Ambulatory Visit (HOSPITAL_BASED_OUTPATIENT_CLINIC_OR_DEPARTMENT_OTHER): Payer: Medicare Other

## 2015-11-18 ENCOUNTER — Other Ambulatory Visit (HOSPITAL_BASED_OUTPATIENT_CLINIC_OR_DEPARTMENT_OTHER): Payer: Medicare Other

## 2015-11-18 VITALS — BP 125/71 | HR 97 | Temp 99.0°F

## 2015-11-18 DIAGNOSIS — C3401 Malignant neoplasm of right main bronchus: Secondary | ICD-10-CM | POA: Diagnosis present

## 2015-11-18 DIAGNOSIS — Z5111 Encounter for antineoplastic chemotherapy: Secondary | ICD-10-CM

## 2015-11-18 DIAGNOSIS — Z5189 Encounter for other specified aftercare: Secondary | ICD-10-CM | POA: Diagnosis not present

## 2015-11-18 DIAGNOSIS — C342 Malignant neoplasm of middle lobe, bronchus or lung: Secondary | ICD-10-CM

## 2015-11-18 DIAGNOSIS — C3491 Malignant neoplasm of unspecified part of right bronchus or lung: Secondary | ICD-10-CM

## 2015-11-18 LAB — COMPREHENSIVE METABOLIC PANEL
ALT: 10 U/L (ref 0–55)
AST: 21 U/L (ref 5–34)
Albumin: 3.5 g/dL (ref 3.5–5.0)
Alkaline Phosphatase: 88 U/L (ref 40–150)
Anion Gap: 11 mEq/L (ref 3–11)
BUN: 9 mg/dL (ref 7.0–26.0)
CHLORIDE: 109 meq/L (ref 98–109)
CO2: 20 meq/L — AB (ref 22–29)
CREATININE: 0.8 mg/dL (ref 0.6–1.1)
Calcium: 9.2 mg/dL (ref 8.4–10.4)
EGFR: >90 mL/min/{1.73_m2} (ref >90)
GLUCOSE: 110 mg/dL (ref 70–140)
Potassium: 4.5 mEq/L (ref 3.5–5.1)
Sodium: 140 mEq/L (ref 136–145)
Total Bilirubin: 0.66 mg/dL (ref 0.20–1.20)
Total Protein: 7.5 g/dL (ref 6.4–8.3)

## 2015-11-18 LAB — CBC WITH DIFFERENTIAL/PLATELET
BASO%: 0.5 % (ref 0.0–2.0)
Basophils Absolute: 0 10*3/uL (ref 0.0–0.1)
EOS%: 0.9 % (ref 0.0–7.0)
Eosinophils Absolute: 0 10*3/uL (ref 0.0–0.5)
HEMATOCRIT: 30.5 % — AB (ref 34.8–46.6)
HGB: 10.1 g/dL — ABNORMAL LOW (ref 11.6–15.9)
LYMPH#: 0.4 10*3/uL — AB (ref 0.9–3.3)
LYMPH%: 12.5 % — ABNORMAL LOW (ref 14.0–49.7)
MCH: 32.2 pg (ref 25.1–34.0)
MCHC: 33.2 g/dL (ref 31.5–36.0)
MCV: 96.8 fL (ref 79.5–101.0)
MONO#: 0.4 10*3/uL (ref 0.1–0.9)
MONO%: 11.8 % (ref 0.0–14.0)
NEUT#: 2.4 10*3/uL (ref 1.5–6.5)
NEUT%: 74.3 % (ref 38.4–76.8)
Platelets: 219 10*3/uL (ref 145–400)
RBC: 3.15 10*6/uL — AB (ref 3.70–5.45)
RDW: 20.5 % — ABNORMAL HIGH (ref 11.2–14.5)
WBC: 3.2 10*3/uL — ABNORMAL LOW (ref 3.9–10.3)

## 2015-11-18 MED ORDER — PALONOSETRON HCL INJECTION 0.25 MG/5ML
INTRAVENOUS | Status: AC
  Filled 2015-11-18: qty 5

## 2015-11-18 MED ORDER — SODIUM CHLORIDE 0.9 % IV SOLN
Freq: Once | INTRAVENOUS | Status: AC
  Administered 2015-11-18: 10:00:00 via INTRAVENOUS

## 2015-11-18 MED ORDER — FAMOTIDINE IN NACL 20-0.9 MG/50ML-% IV SOLN
INTRAVENOUS | Status: AC
  Filled 2015-11-18: qty 50

## 2015-11-18 MED ORDER — FAMOTIDINE IN NACL 20-0.9 MG/50ML-% IV SOLN
20.0000 mg | Freq: Once | INTRAVENOUS | Status: AC
  Administered 2015-11-18: 20 mg via INTRAVENOUS

## 2015-11-18 MED ORDER — PACLITAXEL CHEMO INJECTION 300 MG/50ML
175.0000 mg/m2 | Freq: Once | INTRAVENOUS | Status: AC
  Administered 2015-11-18: 378 mg via INTRAVENOUS
  Filled 2015-11-18: qty 63

## 2015-11-18 MED ORDER — CARBOPLATIN CHEMO INJECTION 600 MG/60ML
680.0000 mg | Freq: Once | INTRAVENOUS | Status: AC
  Administered 2015-11-18: 680 mg via INTRAVENOUS
  Filled 2015-11-18: qty 68

## 2015-11-18 MED ORDER — SODIUM CHLORIDE 0.9 % IV SOLN
20.0000 mg | Freq: Once | INTRAVENOUS | Status: AC
  Administered 2015-11-18: 20 mg via INTRAVENOUS
  Filled 2015-11-18: qty 2

## 2015-11-18 MED ORDER — PEGFILGRASTIM 6 MG/0.6ML ~~LOC~~ PSKT
6.0000 mg | PREFILLED_SYRINGE | Freq: Once | SUBCUTANEOUS | Status: AC
  Administered 2015-11-18: 6 mg via SUBCUTANEOUS
  Filled 2015-11-18: qty 0.6

## 2015-11-18 MED ORDER — PALONOSETRON HCL INJECTION 0.25 MG/5ML
0.2500 mg | Freq: Once | INTRAVENOUS | Status: AC
  Administered 2015-11-18: 0.25 mg via INTRAVENOUS

## 2015-11-18 MED ORDER — DIPHENHYDRAMINE HCL 50 MG/ML IJ SOLN
50.0000 mg | Freq: Once | INTRAMUSCULAR | Status: AC
  Administered 2015-11-18: 50 mg via INTRAVENOUS

## 2015-11-18 MED ORDER — DIPHENHYDRAMINE HCL 50 MG/ML IJ SOLN
INTRAMUSCULAR | Status: AC
  Filled 2015-11-18: qty 1

## 2015-11-25 ENCOUNTER — Other Ambulatory Visit (HOSPITAL_BASED_OUTPATIENT_CLINIC_OR_DEPARTMENT_OTHER): Payer: Medicare Other

## 2015-11-25 DIAGNOSIS — C3401 Malignant neoplasm of right main bronchus: Secondary | ICD-10-CM

## 2015-11-25 DIAGNOSIS — C3491 Malignant neoplasm of unspecified part of right bronchus or lung: Secondary | ICD-10-CM

## 2015-11-25 LAB — COMPREHENSIVE METABOLIC PANEL
ALT: 28 U/L (ref 0–55)
ANION GAP: 12 meq/L — AB (ref 3–11)
AST: 25 U/L (ref 5–34)
Albumin: 3.7 g/dL (ref 3.5–5.0)
Alkaline Phosphatase: 114 U/L (ref 40–150)
BUN: 9.8 mg/dL (ref 7.0–26.0)
CALCIUM: 9.9 mg/dL (ref 8.4–10.4)
CHLORIDE: 103 meq/L (ref 98–109)
CO2: 23 meq/L (ref 22–29)
Creatinine: 0.9 mg/dL (ref 0.6–1.1)
EGFR: 89 mL/min/{1.73_m2} — AB (ref >90)
Glucose: 118 mg/dl (ref 70–140)
POTASSIUM: 3.9 meq/L (ref 3.5–5.1)
Sodium: 137 mEq/L (ref 136–145)
Total Bilirubin: 0.73 mg/dL (ref 0.20–1.20)
Total Protein: 7.8 g/dL (ref 6.4–8.3)

## 2015-11-25 LAB — CBC WITH DIFFERENTIAL/PLATELET
BASO%: 0.4 % (ref 0.0–2.0)
BASOS ABS: 0 10*3/uL (ref 0.0–0.1)
EOS ABS: 0 10*3/uL (ref 0.0–0.5)
EOS%: 0.2 % (ref 0.0–7.0)
HEMATOCRIT: 32 % — AB (ref 34.8–46.6)
HGB: 10.7 g/dL — ABNORMAL LOW (ref 11.6–15.9)
LYMPH%: 12.2 % — AB (ref 14.0–49.7)
MCH: 32.7 pg (ref 25.1–34.0)
MCHC: 33.3 g/dL (ref 31.5–36.0)
MCV: 98.1 fL (ref 79.5–101.0)
MONO#: 0.8 10*3/uL (ref 0.1–0.9)
MONO%: 15.6 % — AB (ref 0.0–14.0)
NEUT#: 3.4 10*3/uL (ref 1.5–6.5)
NEUT%: 71.6 % (ref 38.4–76.8)
PLATELETS: 176 10*3/uL (ref 145–400)
RBC: 3.26 10*6/uL — AB (ref 3.70–5.45)
RDW: 18.4 % — ABNORMAL HIGH (ref 11.2–14.5)
WBC: 4.8 10*3/uL (ref 3.9–10.3)
lymph#: 0.6 10*3/uL — ABNORMAL LOW (ref 0.9–3.3)

## 2015-12-02 ENCOUNTER — Other Ambulatory Visit (HOSPITAL_BASED_OUTPATIENT_CLINIC_OR_DEPARTMENT_OTHER): Payer: Medicare Other

## 2015-12-02 DIAGNOSIS — C3401 Malignant neoplasm of right main bronchus: Secondary | ICD-10-CM

## 2015-12-02 DIAGNOSIS — C3491 Malignant neoplasm of unspecified part of right bronchus or lung: Secondary | ICD-10-CM

## 2015-12-02 LAB — CBC WITH DIFFERENTIAL/PLATELET
BASO%: 0.6 % (ref 0.0–2.0)
Basophils Absolute: 0 10*3/uL (ref 0.0–0.1)
EOS ABS: 0 10*3/uL (ref 0.0–0.5)
EOS%: 0.3 % (ref 0.0–7.0)
HCT: 27.3 % — ABNORMAL LOW (ref 34.8–46.6)
HGB: 9.2 g/dL — ABNORMAL LOW (ref 11.6–15.9)
LYMPH%: 16.1 % (ref 14.0–49.7)
MCH: 32.1 pg (ref 25.1–34.0)
MCHC: 33.7 g/dL (ref 31.5–36.0)
MCV: 95.1 fL (ref 79.5–101.0)
MONO#: 0.5 10*3/uL (ref 0.1–0.9)
MONO%: 15.5 % — AB (ref 0.0–14.0)
NEUT%: 67.5 % (ref 38.4–76.8)
NEUTROS ABS: 2.2 10*3/uL (ref 1.5–6.5)
NRBC: 1 % — AB (ref 0–0)
Platelets: 142 10*3/uL — ABNORMAL LOW (ref 145–400)
RBC: 2.87 10*6/uL — AB (ref 3.70–5.45)
RDW: 17.3 % — AB (ref 11.2–14.5)
WBC: 3.3 10*3/uL — ABNORMAL LOW (ref 3.9–10.3)
lymph#: 0.5 10*3/uL — ABNORMAL LOW (ref 0.9–3.3)

## 2015-12-02 LAB — COMPREHENSIVE METABOLIC PANEL
ALT: 20 U/L (ref 0–55)
ANION GAP: 8 meq/L (ref 3–11)
AST: 20 U/L (ref 5–34)
Albumin: 3.1 g/dL — ABNORMAL LOW (ref 3.5–5.0)
Alkaline Phosphatase: 111 U/L (ref 40–150)
BILIRUBIN TOTAL: 0.53 mg/dL (ref 0.20–1.20)
BUN: 7.8 mg/dL (ref 7.0–26.0)
CHLORIDE: 108 meq/L (ref 98–109)
CO2: 25 meq/L (ref 22–29)
CREATININE: 0.8 mg/dL (ref 0.6–1.1)
Calcium: 9.3 mg/dL (ref 8.4–10.4)
EGFR: >90 mL/min/{1.73_m2} (ref >90)
GLUCOSE: 103 mg/dL (ref 70–140)
Potassium: 3.5 mEq/L (ref 3.5–5.1)
SODIUM: 140 meq/L (ref 136–145)
TOTAL PROTEIN: 7 g/dL (ref 6.4–8.3)

## 2015-12-09 ENCOUNTER — Ambulatory Visit (HOSPITAL_BASED_OUTPATIENT_CLINIC_OR_DEPARTMENT_OTHER): Payer: Medicare Other | Admitting: Oncology

## 2015-12-09 ENCOUNTER — Ambulatory Visit (HOSPITAL_BASED_OUTPATIENT_CLINIC_OR_DEPARTMENT_OTHER): Payer: Medicare Other

## 2015-12-09 ENCOUNTER — Telehealth: Payer: Self-pay | Admitting: Internal Medicine

## 2015-12-09 ENCOUNTER — Other Ambulatory Visit (HOSPITAL_BASED_OUTPATIENT_CLINIC_OR_DEPARTMENT_OTHER): Payer: Medicare Other

## 2015-12-09 ENCOUNTER — Encounter: Payer: Self-pay | Admitting: Oncology

## 2015-12-09 VITALS — BP 121/74 | HR 131 | Temp 98.4°F | Resp 18 | Ht 68.0 in | Wt 211.6 lb

## 2015-12-09 DIAGNOSIS — C3491 Malignant neoplasm of unspecified part of right bronchus or lung: Secondary | ICD-10-CM

## 2015-12-09 DIAGNOSIS — C3401 Malignant neoplasm of right main bronchus: Secondary | ICD-10-CM

## 2015-12-09 DIAGNOSIS — Z5111 Encounter for antineoplastic chemotherapy: Secondary | ICD-10-CM

## 2015-12-09 DIAGNOSIS — C342 Malignant neoplasm of middle lobe, bronchus or lung: Secondary | ICD-10-CM

## 2015-12-09 DIAGNOSIS — Z5189 Encounter for other specified aftercare: Secondary | ICD-10-CM

## 2015-12-09 LAB — COMPREHENSIVE METABOLIC PANEL
ALBUMIN: 3.4 g/dL — AB (ref 3.5–5.0)
ALK PHOS: 100 U/L (ref 40–150)
ALT: 12 U/L (ref 0–55)
AST: 13 U/L (ref 5–34)
Anion Gap: 9 mEq/L (ref 3–11)
BUN: 8.4 mg/dL (ref 7.0–26.0)
CO2: 25 meq/L (ref 22–29)
Calcium: 9.9 mg/dL (ref 8.4–10.4)
Chloride: 104 mEq/L (ref 98–109)
Creatinine: 0.9 mg/dL (ref 0.6–1.1)
EGFR: 89 mL/min/{1.73_m2} — AB (ref >90)
GLUCOSE: 134 mg/dL (ref 70–140)
POTASSIUM: 3.7 meq/L (ref 3.5–5.1)
SODIUM: 139 meq/L (ref 136–145)
Total Bilirubin: 0.65 mg/dL (ref 0.20–1.20)
Total Protein: 7.8 g/dL (ref 6.4–8.3)

## 2015-12-09 LAB — CBC WITH DIFFERENTIAL/PLATELET
BASO%: 0.3 % (ref 0.0–2.0)
BASOS ABS: 0 10*3/uL (ref 0.0–0.1)
EOS ABS: 0 10*3/uL (ref 0.0–0.5)
EOS%: 0.6 % (ref 0.0–7.0)
HCT: 29.8 % — ABNORMAL LOW (ref 34.8–46.6)
HGB: 9.9 g/dL — ABNORMAL LOW (ref 11.6–15.9)
LYMPH%: 14.2 % (ref 14.0–49.7)
MCH: 31.8 pg (ref 25.1–34.0)
MCHC: 33.2 g/dL (ref 31.5–36.0)
MCV: 95.8 fL (ref 79.5–101.0)
MONO#: 0.3 10*3/uL (ref 0.1–0.9)
MONO%: 10.7 % (ref 0.0–14.0)
NEUT#: 2.4 10*3/uL (ref 1.5–6.5)
NEUT%: 74.2 % (ref 38.4–76.8)
Platelets: 223 10*3/uL (ref 145–400)
RBC: 3.11 10*6/uL — AB (ref 3.70–5.45)
RDW: 16.7 % — ABNORMAL HIGH (ref 11.2–14.5)
WBC: 3.2 10*3/uL — ABNORMAL LOW (ref 3.9–10.3)
lymph#: 0.5 10*3/uL — ABNORMAL LOW (ref 0.9–3.3)

## 2015-12-09 MED ORDER — DIPHENHYDRAMINE HCL 50 MG/ML IJ SOLN
50.0000 mg | Freq: Once | INTRAMUSCULAR | Status: AC
  Administered 2015-12-09: 50 mg via INTRAVENOUS

## 2015-12-09 MED ORDER — PEGFILGRASTIM 6 MG/0.6ML ~~LOC~~ PSKT
6.0000 mg | PREFILLED_SYRINGE | Freq: Once | SUBCUTANEOUS | Status: AC
  Administered 2015-12-09: 6 mg via SUBCUTANEOUS
  Filled 2015-12-09: qty 0.6

## 2015-12-09 MED ORDER — BENZONATATE 100 MG PO CAPS: 100.0000 mg | ORAL_CAPSULE | Freq: Three times a day (TID) | ORAL | Status: DC | PRN

## 2015-12-09 MED ORDER — PALONOSETRON HCL INJECTION 0.25 MG/5ML
INTRAVENOUS | Status: AC
  Filled 2015-12-09: qty 5

## 2015-12-09 MED ORDER — FAMOTIDINE IN NACL 20-0.9 MG/50ML-% IV SOLN
20.0000 mg | Freq: Once | INTRAVENOUS | Status: AC
  Administered 2015-12-09: 20 mg via INTRAVENOUS

## 2015-12-09 MED ORDER — SODIUM CHLORIDE 0.9 % IV SOLN
680.0000 mg | Freq: Once | INTRAVENOUS | Status: AC
  Administered 2015-12-09: 680 mg via INTRAVENOUS
  Filled 2015-12-09: qty 68

## 2015-12-09 MED ORDER — SODIUM CHLORIDE 0.9 % IV SOLN
20.0000 mg | Freq: Once | INTRAVENOUS | Status: AC
  Administered 2015-12-09: 20 mg via INTRAVENOUS
  Filled 2015-12-09: qty 2

## 2015-12-09 MED ORDER — PALONOSETRON HCL INJECTION 0.25 MG/5ML
0.2500 mg | Freq: Once | INTRAVENOUS | Status: AC
  Administered 2015-12-09: 0.25 mg via INTRAVENOUS

## 2015-12-09 MED ORDER — FAMOTIDINE IN NACL 20-0.9 MG/50ML-% IV SOLN
INTRAVENOUS | Status: AC
  Filled 2015-12-09: qty 50

## 2015-12-09 MED ORDER — SODIUM CHLORIDE 0.9 % IV SOLN
Freq: Once | INTRAVENOUS | Status: AC
  Administered 2015-12-09: 11:00:00 via INTRAVENOUS

## 2015-12-09 MED ORDER — DEXTROSE 5 % IV SOLN
175.0000 mg/m2 | Freq: Once | INTRAVENOUS | Status: AC
  Administered 2015-12-09: 378 mg via INTRAVENOUS
  Filled 2015-12-09: qty 63

## 2015-12-09 MED ORDER — DIPHENHYDRAMINE HCL 50 MG/ML IJ SOLN
INTRAMUSCULAR | Status: AC
  Filled 2015-12-09: qty 1

## 2015-12-09 NOTE — Telephone Encounter (Signed)
per pof to sch pt appt-sent MW emailt o sch trmt -pt will get updated copy b4 leaving

## 2015-12-09 NOTE — Progress Notes (Signed)
[place] [phone]   Fax:(336) [phone] Multidisciplinary thoracic oncology clinic  OFFICE PROGRESS NOTE  No PCP Per Patient No address on file  DIAGNOSIS: Stage IIIA (T2b, N2, M0) non-small cell lung cancer, invasive poorly differentiated squamous cell carcinoma diagnosed in November 2016 and presented with large right hilar mass with collapse of the right middle lobe and extension into the right upper lobe and right lower lobe along the hilum with right paratracheal and subcarinal lymphadenopathy. PDL 1 testing is still pending.  PRIOR THERAPY: Concurrent chemoradiation with weekly carboplatin for AUC of 2 and paclitaxel 45 MG/M2. First dose 08/19/2015. Status post 5 cycles. Last cycle was given 10/07/2015 with significant response.   CURRENT THERAPY: Consolidation chemotherapy with carboplatin for AUC of 5 and paclitaxel 175 MG/M2 every 3 weeks with Neulasta support. First dose 11/18/2015. S/P 1 cycle.   INTERVAL HISTORY: Theresa [name] 55 y.o. female returns to the clinic today for follow-up visit. The patient is feeling fine today overall. Notices that she has intermittent numbness in her left toes. Indicates that it is tolerable. She denied having any significant weight loss or night sweats. She has no chest pain, shortness breath, or hemoptysis. Reports cough at night and uses Tussionex. States Tussionex is too expensive and would like something else for her cough. She has no nausea or vomiting, no fever or chills. Of note, HR is elevated today. Patient states that she stopped her Metoprolol about 2 weeks ago. Still has some at home. She is here for evaluation prior to cycle 2 of her chemotherapy.   MEDICAL HISTORY: Past Medical History  Diagnosis Date  . Pneumonia   . Cancer ([place])     5 years ago - cervical cancer  . Non-small cell carcinoma of lung, stage 3 ([place]) 08/08/2015  . Encounter for antineoplastic chemotherapy 08/27/2015  .  Paroxysmal atrial flutter ([place]) 09/16/2015  . Hypokalemia 09/30/2015    ALLERGIES:  has No Known Allergies.  MEDICATIONS:  Current Outpatient Prescriptions  Medication Sig Dispense Refill  . oxyCODONE-acetaminophen (PERCOCET/ROXICET) 5-325 MG tablet Take 1 tablet by mouth every 6 (six) hours as needed for severe pain. 40 tablet 0  . benzonatate (TESSALON) 100 MG capsule Take 1 capsule (100 mg total) by mouth 3 (three) times daily as needed for cough. 30 capsule 0  . metoprolol tartrate (LOPRESSOR) 25 MG tablet Take 1 tablet (25 mg total) by mouth 2 (two) times daily. (Patient not taking: Reported on 12/09/2015) 60 tablet 3   No current facility-administered medications for this visit.   Facility-Administered Medications Ordered in Other Visits  Medication Dose Route Frequency Provider Last Rate Last Dose  . CARBOplatin (PARAPLATIN) 680 mg in sodium chloride 0.9 % 250 mL chemo infusion  680 mg Intravenous Once Curt Bears, MD      . PACLitaxel (TAXOL) 378 mg in dextrose 5 % 500 mL chemo infusion (> '80mg'$ /m2)  175 mg/m2 (Treatment Plan Actual) Intravenous Once Curt Bears, MD 188 mL/hr at 12/09/15 1143 378 mg at 12/09/15 1143    SURGICAL HISTORY:  Past Surgical History  Procedure Laterality Date  . Wisdom tooth extraction    . Tubal ligation    . Video bronchoscopy with endobronchial ultrasound N/A 08/02/2015    Procedure: VIDEO BRONCHOSCOPY WITH ENDOBRONCHIAL ULTRASOUND;  Surgeon: [name], MD;  Location: [facility];  Service: Thoracic;  Laterality: N/A;    REVIEW OF SYSTEMS:  Constitutional: negative Eyes: negative Ears, nose, mouth, throat, and face: negative  Respiratory: negative Cardiovascular: negative Gastrointestinal: negative Genitourinary:negative Integument/breast: negative Hematologic/lymphatic: negative Musculoskeletal:negative Neurological: negative Behavioral/Psych: negative Endocrine: negative Allergic/Immunologic: negative   PHYSICAL EXAMINATION:  General appearance: alert, cooperative and no distress Head: Normocephalic, without obvious abnormality, atraumatic Neck: no adenopathy, no JVD, supple, symmetrical, trachea midline and thyroid not enlarged, symmetric, no tenderness/mass/nodules Lymph nodes: Cervical, supraclavicular, and axillary nodes normal. Resp: clear to auscultation bilaterally Back: symmetric, no curvature. ROM normal. No CVA tenderness. Cardio: regular rate and rhythm, S1, S2 normal, no murmur, click, rub or gallop GI: soft, non-tender; bowel sounds normal; no masses,  no organomegaly Extremities: extremities normal, atraumatic, no cyanosis or edema Neurologic: Alert and oriented X 3, normal strength and tone. Normal symmetric reflexes. Normal coordination and gait  ECOG PERFORMANCE STATUS: 1 - Symptomatic but completely ambulatory  Blood pressure 121/74, pulse 131, temperature 98.4 F (36.9 C), temperature source Oral, resp. rate 18, height '5\' 8"'$  (1.727 m), weight 211 lb 9.6 oz (95.981 kg), SpO2 100 %.  LABORATORY DATA: Lab Results  Component Value Date   WBC 3.2* 12/09/2015   HGB 9.9* 12/09/2015   HCT 29.8* 12/09/2015   MCV 95.8 12/09/2015   PLT 223 12/09/2015      Chemistry      Component Value Date/Time   NA 139 12/09/2015 0908   NA 141 10/11/2015 0530   K 3.7 12/09/2015 0908   K 3.3* 10/11/2015 0530   CL 109 10/11/2015 0530   CO2 25 12/09/2015 0908   CO2 22 10/11/2015 0530   BUN 8.4 12/09/2015 0908   BUN 8 10/11/2015 0530   CREATININE 0.9 12/09/2015 0908   CREATININE 0.82 10/11/2015 0530      Component Value Date/Time   CALCIUM 9.9 12/09/2015 0908   CALCIUM 9.4 10/11/2015 0530   ALKPHOS 100 12/09/2015 0908   ALKPHOS 60 10/10/2015 2249   AST 13 12/09/2015 0908   AST 17 10/10/2015 2249   ALT 12 12/09/2015 0908   ALT 15 10/10/2015 2249   BILITOT 0.65 12/09/2015 0908   BILITOT 1.0 10/10/2015 2249       RADIOGRAPHIC STUDIES: No results found.  ASSESSMENT AND PLAN: This is a very  pleasant 55 year old African-American female recently diagnosed with a stage IIIa non-small cell lung cancer, squamous cell carcinoma with large right hilar mass in addition to mediastinal lymphadenopathy diagnosed in November 2016. The patient completed a course of concurrent chemoradiation with weekly carboplatin and paclitaxel status post 5 cycles.  She tolerated her treatment well and the recent CT scan of the chest showed significant improvement of her disease. Now receiving 3 cycles of consolidation chemotherapy with carboplatin for AUC of 5 and paclitaxel 175 MG/M2 every 3 weeks with Neulasta support.  Recommend that the patient proceed with cycle 2 of her chemotherapy today as scheduled. New prescription for Tessalon capsules sent to her pharmacy.   The patient would come back for follow-up visit in 3 weeks for reevaluation before starting cycle #3. She was advised to call immediately if she has any concerning symptoms in the interval. The patient voices understanding of current disease status and treatment options and is in agreement with the current care plan.  All questions were answered. The patient knows to call the clinic with any problems, questions or concerns. We can certainly see the patient much sooner if necessary.  Mikey Bussing, DNP, AGPCNP-BC, AOCNP

## 2015-12-09 NOTE — Patient Instructions (Signed)
[facility] Discharge Instructions for Patients Receiving Chemotherapy  Today you received the following chemotherapy agents:  Carboplatin and Taxol.  To help prevent nausea and vomiting after your treatment, we encourage you to take your nausea medication as prescribed.   If you develop nausea and vomiting that is not controlled by your nausea medication, call the clinic.   BELOW ARE SYMPTOMS THAT SHOULD BE REPORTED IMMEDIATELY:  *FEVER GREATER THAN 100.5 F  *CHILLS WITH OR WITHOUT FEVER  NAUSEA AND VOMITING THAT IS NOT CONTROLLED WITH YOUR NAUSEA MEDICATION  *UNUSUAL SHORTNESS OF BREATH  *UNUSUAL BRUISING OR BLEEDING  TENDERNESS IN MOUTH AND THROAT WITH OR WITHOUT PRESENCE OF ULCERS  *URINARY PROBLEMS  *BOWEL PROBLEMS  UNUSUAL RASH Items with * indicate a potential emergency and should be followed up as soon as possible.  Feel free to call the clinic you have any questions or concerns. The clinic phone number is [phone].  Please show the [facility] at check-in to the Emergency Department and triage nurse.

## 2015-12-10 ENCOUNTER — Telehealth: Payer: Self-pay

## 2015-12-10 ENCOUNTER — Telehealth: Payer: Self-pay | Admitting: Internal Medicine

## 2015-12-10 NOTE — Telephone Encounter (Signed)
Pt called stating she cannot see any green light on her neulasta onpro. It is not time for it to be finished yet. Requested pt to come to [facility] so we can look at it. She will come at about 1530 and if the onpro is not functioning, we can give her her neulasta shot and save her a trip tomorrow. Pt was agreeable to this plan.

## 2015-12-10 NOTE — Telephone Encounter (Signed)
Spoke with patient re inj appointment for 3/22 @ 8:45 am.

## 2015-12-10 NOTE — Telephone Encounter (Signed)
Pt called stating she is having car trouble and will not be able to come in today. She will see if the neulasta injector works later. It is that she and her husband cannot see the green light but she is also stating they are outside where it is bright. POF for injection appt tomorrow am placed. Requested the pt to bring the onpro injector to [facility] tomorrow if it fails so we can send it back to the company. She will call and cancel the injection appt if the injector does work.

## 2015-12-11 ENCOUNTER — Telehealth: Payer: Self-pay | Admitting: *Deleted

## 2015-12-11 ENCOUNTER — Ambulatory Visit: Payer: Medicare Other

## 2015-12-11 NOTE — Telephone Encounter (Signed)
Called Deajah about Neulasta On Pro.  States that when she got inside out of the sun, she would see the green light and the beeps went off. And she also could tell that it worked.  She is starting to feel the bone aches from the injection.   Does not need to come in for another injection

## 2015-12-16 ENCOUNTER — Telehealth: Payer: Self-pay | Admitting: Medical Oncology

## 2015-12-16 ENCOUNTER — Other Ambulatory Visit (HOSPITAL_BASED_OUTPATIENT_CLINIC_OR_DEPARTMENT_OTHER): Payer: Medicare Other

## 2015-12-16 DIAGNOSIS — C3491 Malignant neoplasm of unspecified part of right bronchus or lung: Secondary | ICD-10-CM

## 2015-12-16 DIAGNOSIS — C3401 Malignant neoplasm of right main bronchus: Secondary | ICD-10-CM

## 2015-12-16 DIAGNOSIS — E876 Hypokalemia: Secondary | ICD-10-CM

## 2015-12-16 LAB — COMPREHENSIVE METABOLIC PANEL
ALK PHOS: 102 U/L (ref 40–150)
ALT: 18 U/L (ref 0–55)
AST: 17 U/L (ref 5–34)
Albumin: 3.4 g/dL — ABNORMAL LOW (ref 3.5–5.0)
Anion Gap: 11 mEq/L (ref 3–11)
BILIRUBIN TOTAL: 0.74 mg/dL (ref 0.20–1.20)
BUN: 12.5 mg/dL (ref 7.0–26.0)
CALCIUM: 9.8 mg/dL (ref 8.4–10.4)
CO2: 27 mEq/L (ref 22–29)
CREATININE: 0.9 mg/dL (ref 0.6–1.1)
Chloride: 101 mEq/L (ref 98–109)
EGFR: >90 mL/min/{1.73_m2} (ref >90)
Glucose: 124 mg/dl (ref 70–140)
POTASSIUM: 2.9 meq/L — AB (ref 3.5–5.1)
Sodium: 139 mEq/L (ref 136–145)
TOTAL PROTEIN: 7.5 g/dL (ref 6.4–8.3)

## 2015-12-16 LAB — CBC WITH DIFFERENTIAL/PLATELET
BASO%: 0.9 % (ref 0.0–2.0)
BASOS ABS: 0 10*3/uL (ref 0.0–0.1)
EOS ABS: 0 10*3/uL (ref 0.0–0.5)
EOS%: 0.9 % (ref 0.0–7.0)
HCT: 26.6 % — ABNORMAL LOW (ref 34.8–46.6)
HGB: 8.9 g/dL — ABNORMAL LOW (ref 11.6–15.9)
LYMPH%: 17.9 % (ref 14.0–49.7)
MCH: 32.7 pg (ref 25.1–34.0)
MCHC: 33.4 g/dL (ref 31.5–36.0)
MCV: 97.8 fL (ref 79.5–101.0)
MONO#: 0.3 10*3/uL (ref 0.1–0.9)
MONO%: 13.1 % (ref 0.0–14.0)
NEUT%: 67.2 % (ref 38.4–76.8)
NEUTROS ABS: 1.4 10*3/uL — AB (ref 1.5–6.5)
PLATELETS: 87 10*3/uL — AB (ref 145–400)
RBC: 2.72 10*6/uL — AB (ref 3.70–5.45)
RDW: 16.4 % — ABNORMAL HIGH (ref 11.2–14.5)
WBC: 2.1 10*3/uL — ABNORMAL LOW (ref 3.9–10.3)
lymph#: 0.4 10*3/uL — ABNORMAL LOW (ref 0.9–3.3)

## 2015-12-16 MED ORDER — POTASSIUM CHLORIDE CRYS ER 20 MEQ PO TBCR: 20.0000 meq | EXTENDED_RELEASE_TABLET | Freq: Every day | ORAL | Status: DC

## 2015-12-16 NOTE — Progress Notes (Signed)
Quick Note:  Call patient with the result and order K dur 20 meq po qd x 7 days. ______

## 2015-12-16 NOTE — Telephone Encounter (Signed)
-----   Message from Curt Bears, MD sent at 12/16/2015  1:14 PM EDT ----- Call patient with the result and order K dur 20 meq po qd x 7 days.

## 2015-12-23 ENCOUNTER — Other Ambulatory Visit (HOSPITAL_BASED_OUTPATIENT_CLINIC_OR_DEPARTMENT_OTHER): Payer: Medicare Other

## 2015-12-23 DIAGNOSIS — C3491 Malignant neoplasm of unspecified part of right bronchus or lung: Secondary | ICD-10-CM

## 2015-12-23 DIAGNOSIS — C3401 Malignant neoplasm of right main bronchus: Secondary | ICD-10-CM

## 2015-12-23 LAB — CBC WITH DIFFERENTIAL/PLATELET
BASO%: 0.3 % (ref 0.0–2.0)
BASOS ABS: 0 10*3/uL (ref 0.0–0.1)
EOS ABS: 0 10*3/uL (ref 0.0–0.5)
EOS%: 0.7 % (ref 0.0–7.0)
HEMATOCRIT: 24.6 % — AB (ref 34.8–46.6)
HEMOGLOBIN: 8.3 g/dL — AB (ref 11.6–15.9)
LYMPH%: 14.4 % (ref 14.0–49.7)
MCH: 32.5 pg (ref 25.1–34.0)
MCHC: 33.7 g/dL (ref 31.5–36.0)
MCV: 96.5 fL (ref 79.5–101.0)
MONO#: 0.2 10*3/uL (ref 0.1–0.9)
MONO%: 6.2 % (ref 0.0–14.0)
NEUT#: 2.3 10*3/uL (ref 1.5–6.5)
NEUT%: 78.4 % — ABNORMAL HIGH (ref 38.4–76.8)
PLATELETS: 88 10*3/uL — AB (ref 145–400)
RBC: 2.55 10*6/uL — ABNORMAL LOW (ref 3.70–5.45)
RDW: 15.9 % — AB (ref 11.2–14.5)
WBC: 2.9 10*3/uL — ABNORMAL LOW (ref 3.9–10.3)
lymph#: 0.4 10*3/uL — ABNORMAL LOW (ref 0.9–3.3)

## 2015-12-23 LAB — COMPREHENSIVE METABOLIC PANEL
ALBUMIN: 3.1 g/dL — AB (ref 3.5–5.0)
ALK PHOS: 101 U/L (ref 40–150)
ALT: 11 U/L (ref 0–55)
ANION GAP: 9 meq/L (ref 3–11)
AST: 12 U/L (ref 5–34)
BILIRUBIN TOTAL: 0.37 mg/dL (ref 0.20–1.20)
BUN: 9.3 mg/dL (ref 7.0–26.0)
CALCIUM: 9.5 mg/dL (ref 8.4–10.4)
CO2: 25 mEq/L (ref 22–29)
Chloride: 107 mEq/L (ref 98–109)
Creatinine: 0.9 mg/dL (ref 0.6–1.1)
EGFR: 83 mL/min/{1.73_m2} — AB (ref >90)
Glucose: 117 mg/dl (ref 70–140)
POTASSIUM: 3.6 meq/L (ref 3.5–5.1)
Sodium: 141 mEq/L (ref 136–145)
TOTAL PROTEIN: 7.4 g/dL (ref 6.4–8.3)

## 2015-12-30 ENCOUNTER — Other Ambulatory Visit (HOSPITAL_BASED_OUTPATIENT_CLINIC_OR_DEPARTMENT_OTHER): Payer: Medicare Other

## 2015-12-30 ENCOUNTER — Telehealth: Payer: Self-pay | Admitting: Internal Medicine

## 2015-12-30 ENCOUNTER — Encounter: Payer: Self-pay | Admitting: Internal Medicine

## 2015-12-30 ENCOUNTER — Ambulatory Visit (HOSPITAL_BASED_OUTPATIENT_CLINIC_OR_DEPARTMENT_OTHER): Payer: Medicare Other | Admitting: Internal Medicine

## 2015-12-30 ENCOUNTER — Ambulatory Visit: Payer: Medicare Other

## 2015-12-30 VITALS — BP 119/77 | HR 129 | Temp 98.4°F | Resp 18 | Ht 68.0 in | Wt 208.5 lb

## 2015-12-30 DIAGNOSIS — C3491 Malignant neoplasm of unspecified part of right bronchus or lung: Secondary | ICD-10-CM

## 2015-12-30 DIAGNOSIS — R52 Pain, unspecified: Secondary | ICD-10-CM | POA: Diagnosis not present

## 2015-12-30 DIAGNOSIS — C3401 Malignant neoplasm of right main bronchus: Secondary | ICD-10-CM

## 2015-12-30 DIAGNOSIS — R5383 Other fatigue: Secondary | ICD-10-CM

## 2015-12-30 DIAGNOSIS — Z5111 Encounter for antineoplastic chemotherapy: Secondary | ICD-10-CM

## 2015-12-30 DIAGNOSIS — G620 Drug-induced polyneuropathy: Secondary | ICD-10-CM | POA: Diagnosis not present

## 2015-12-30 LAB — COMPREHENSIVE METABOLIC PANEL
ALBUMIN: 3.2 g/dL — AB (ref 3.5–5.0)
ALK PHOS: 103 U/L (ref 40–150)
ALT: 11 U/L (ref 0–55)
AST: 16 U/L (ref 5–34)
Anion Gap: 11 mEq/L (ref 3–11)
BUN: 8.6 mg/dL (ref 7.0–26.0)
CHLORIDE: 107 meq/L (ref 98–109)
CO2: 23 mEq/L (ref 22–29)
Calcium: 9.6 mg/dL (ref 8.4–10.4)
Creatinine: 0.9 mg/dL (ref 0.6–1.1)
EGFR: >90 mL/min/{1.73_m2} (ref >90)
Glucose: 123 mg/dl (ref 70–140)
POTASSIUM: 3.3 meq/L — AB (ref 3.5–5.1)
Sodium: 141 mEq/L (ref 136–145)
Total Bilirubin: 0.39 mg/dL (ref 0.20–1.20)
Total Protein: 7.7 g/dL (ref 6.4–8.3)

## 2015-12-30 LAB — CBC WITH DIFFERENTIAL/PLATELET
BASO%: 0.5 % (ref 0.0–2.0)
BASOS ABS: 0 10*3/uL (ref 0.0–0.1)
EOS ABS: 0 10*3/uL (ref 0.0–0.5)
EOS%: 0.7 % (ref 0.0–7.0)
HEMATOCRIT: 27.3 % — AB (ref 34.8–46.6)
HEMOGLOBIN: 9 g/dL — AB (ref 11.6–15.9)
LYMPH#: 0.3 10*3/uL — AB (ref 0.9–3.3)
LYMPH%: 15.7 % (ref 14.0–49.7)
MCH: 32.4 pg (ref 25.1–34.0)
MCHC: 33.1 g/dL (ref 31.5–36.0)
MCV: 97.9 fL (ref 79.5–101.0)
MONO#: 0.2 10*3/uL (ref 0.1–0.9)
MONO%: 10.3 % (ref 0.0–14.0)
NEUT#: 1.4 10*3/uL — ABNORMAL LOW (ref 1.5–6.5)
NEUT%: 72.8 % (ref 38.4–76.8)
PLATELETS: 114 10*3/uL — AB (ref 145–400)
RBC: 2.79 10*6/uL — ABNORMAL LOW (ref 3.70–5.45)
RDW: 16.4 % — ABNORMAL HIGH (ref 11.2–14.5)
WBC: 2 10*3/uL — ABNORMAL LOW (ref 3.9–10.3)

## 2015-12-30 MED ORDER — OXYCODONE-ACETAMINOPHEN 5-325 MG PO TABS: 1.0000 | ORAL_TABLET | Freq: Four times a day (QID) | ORAL | Status: DC | PRN

## 2015-12-30 NOTE — Telephone Encounter (Signed)
Gave and printed appt sched and avs for pt for April and May °

## 2015-12-30 NOTE — Progress Notes (Signed)
[facility] [phone]   [phone]  OFFICE PROGRESS NOTE  No PCP Per Patient No address on file  DIAGNOSIS: Stage IIIA (T2b, N2, M0) non-small cell lung cancer, invasive poorly differentiated squamous cell carcinoma diagnosed in November 2016 and presented with large right hilar mass with collapse of the right middle lobe and extension into the right upper lobe and right lower lobe along the hilum with right paratracheal and subcarinal lymphadenopathy. PDL 1 testing is still pending.  PRIOR THERAPY: Concurrent chemoradiation with weekly carboplatin for AUC of 2 and paclitaxel 45 MG/M2. First dose 08/19/2015. Status post 5 cycles. Last cycle was given 10/07/2015 with significant response.   CURRENT THERAPY: Consolidation chemotherapy with carboplatin for AUC of 5 and paclitaxel 175 MG/M2 every 3 weeks with Neulasta support. First dose 11/18/2015. Status post 2 cycles.  INTERVAL HISTORY: Theresa [name] 55 y.o. female returns to the clinic today for follow-up visit. The patient is feeling well today with no specific complaints except for fatigue and aching pain after the Neulasta injection in addition to peripheral neuropathy. She denied having any significant weight loss or night sweats. She has no chest pain, shortness of breath, cough or hemoptysis. She has no nausea or vomiting, no fever or chills. She was supposed to start cycle #3 today.  MEDICAL HISTORY: Past Medical History  Diagnosis Date  . Pneumonia   . Cancer ([place])     5 years ago - cervical cancer  . Non-small cell carcinoma of lung, stage 3 ([place]) 08/08/2015  . Encounter for antineoplastic chemotherapy 08/27/2015  . Paroxysmal atrial flutter ([place]) 09/16/2015  . Hypokalemia 09/30/2015    ALLERGIES:  has No Known Allergies.  MEDICATIONS:  Current Outpatient Prescriptions  Medication Sig Dispense Refill  . benzonatate (TESSALON) 100 MG capsule Take 1 capsule (100 mg total) by mouth  3 (three) times daily as needed for cough. 30 capsule 0  . metoprolol tartrate (LOPRESSOR) 25 MG tablet Take 1 tablet (25 mg total) by mouth 2 (two) times daily. (Patient not taking: Reported on 12/09/2015) 60 tablet 3  . oxyCODONE-acetaminophen (PERCOCET/ROXICET) 5-325 MG tablet Take 1 tablet by mouth every 6 (six) hours as needed for severe pain. 40 tablet 0  . potassium chloride SA (K-DUR,KLOR-CON) 20 MEQ tablet Take 1 tablet (20 mEq total) by mouth daily. 7 tablet 0   No current facility-administered medications for this visit.    SURGICAL HISTORY:  Past Surgical History  Procedure Laterality Date  . Wisdom tooth extraction    . Tubal ligation    . Video bronchoscopy with endobronchial ultrasound N/A 08/02/2015    Procedure: VIDEO BRONCHOSCOPY WITH ENDOBRONCHIAL ULTRASOUND;  Surgeon: [name], MD;  Location: [facility];  Service: Thoracic;  Laterality: N/A;    REVIEW OF SYSTEMS:  A comprehensive review of systems was negative except for: Constitutional: positive for fatigue Neurological: positive for paresthesia   PHYSICAL EXAMINATION: General appearance: alert, cooperative and no distress Head: Normocephalic, without obvious abnormality, atraumatic Neck: no adenopathy, no JVD, supple, symmetrical, trachea midline and thyroid not enlarged, symmetric, no tenderness/mass/nodules Lymph nodes: Cervical, supraclavicular, and axillary nodes normal. Resp: clear to auscultation bilaterally Back: symmetric, no curvature. ROM normal. No CVA tenderness. Cardio: regular rate and rhythm, S1, S2 normal, no murmur, click, rub or gallop GI: soft, non-tender; bowel sounds normal; no masses,  no organomegaly Extremities: extremities normal, atraumatic, no cyanosis or edema Neurologic: Alert and oriented X 3, normal strength and tone. Normal symmetric reflexes.  Normal coordination and gait  ECOG PERFORMANCE STATUS: 1 - Symptomatic but completely ambulatory  There were no vitals taken for this  visit.  LABORATORY DATA: Lab Results  Component Value Date   WBC 2.0* 12/30/2015   HGB 9.0* 12/30/2015   HCT 27.3* 12/30/2015   MCV 97.9 12/30/2015   PLT 114* 12/30/2015      Chemistry      Component Value Date/Time   NA 141 12/23/2015 0845   NA 141 10/11/2015 0530   K 3.6 12/23/2015 0845   K 3.3* 10/11/2015 0530   CL 109 10/11/2015 0530   CO2 25 12/23/2015 0845   CO2 22 10/11/2015 0530   BUN 9.3 12/23/2015 0845   BUN 8 10/11/2015 0530   CREATININE 0.9 12/23/2015 0845   CREATININE 0.82 10/11/2015 0530      Component Value Date/Time   CALCIUM 9.5 12/23/2015 0845   CALCIUM 9.4 10/11/2015 0530   ALKPHOS 101 12/23/2015 0845   ALKPHOS 60 10/10/2015 2249   AST 12 12/23/2015 0845   AST 17 10/10/2015 2249   ALT 11 12/23/2015 0845   ALT 15 10/10/2015 2249   BILITOT 0.37 12/23/2015 0845   BILITOT 1.0 10/10/2015 2249       RADIOGRAPHIC STUDIES: No results found.  ASSESSMENT AND PLAN: This is a very pleasant 55 years old African-American female recently diagnosed with a stage IIIa non-small cell lung cancer, squamous cell carcinoma with large right hilar mass in addition to mediastinal lymphadenopathy diagnosed in November 2016. The patient completed a course of concurrent chemoradiation with weekly carboplatin and paclitaxel status post 5 cycles.  She is currently undergoing consolidation chemotherapy with carboplatin and paclitaxel status post 2 cycles. She is tolerating her treatment fairly well except for the aching pain and fatigue after the Neulasta injection in addition to the peripheral neuropathy. Her total white blood count and absolute neutrophil count are low today. I recommended for the patient to delay the start of cycle #3 by 1 week. I will see her back for follow-up visit in 4 weeks for reevaluation with repeat CT scan of the chest for restaging of her disease. Pain management, I give the patient refill of Percocet. For the peripheral neuropathy, I will reduce  the dose of paclitaxel to 150 MG/M2 during cycle #3. She was advised to call immediately if she has any concerning symptoms in the interval. The patient voices understanding of current disease status and treatment options and is in agreement with the current care plan.  All questions were answered. The patient knows to call the clinic with any problems, questions or concerns. We can certainly see the patient much sooner if necessary.  Disclaimer: This note was dictated with voice recognition software. Similar sounding words can inadvertently be transcribed and may not be corrected upon review.

## 2016-01-03 ENCOUNTER — Telehealth: Payer: Self-pay | Admitting: Medical Oncology

## 2016-01-03 ENCOUNTER — Other Ambulatory Visit: Payer: Self-pay | Admitting: Medical Oncology

## 2016-01-03 DIAGNOSIS — C342 Malignant neoplasm of middle lobe, bronchus or lung: Secondary | ICD-10-CM

## 2016-01-03 MED ORDER — HYDROCODONE-HOMATROPINE 5-1.5 MG/5ML PO SYRP: 5.0000 mL | ORAL_SOLUTION | Freq: Four times a day (QID) | ORAL | Status: DC | PRN

## 2016-01-03 NOTE — Telephone Encounter (Signed)
I left message for pt to call back . She needs to get a PCP to get her lopressor refill or go to urgent care

## 2016-01-03 NOTE — Telephone Encounter (Signed)
Pt called back and I told her she needs to contact PCP or prescribing provider for refill . She also said the tesslon pearles are not effective "they are just pretty" and requests something stronger.I will talk to Cyndee bacon for antitussive. Pt notified to pick up hycodan rx today before 4 pm.

## 2016-01-06 ENCOUNTER — Other Ambulatory Visit (HOSPITAL_BASED_OUTPATIENT_CLINIC_OR_DEPARTMENT_OTHER): Payer: Medicare Other

## 2016-01-06 ENCOUNTER — Ambulatory Visit (HOSPITAL_BASED_OUTPATIENT_CLINIC_OR_DEPARTMENT_OTHER): Payer: Medicare Other

## 2016-01-06 ENCOUNTER — Other Ambulatory Visit: Payer: Medicare Other

## 2016-01-06 ENCOUNTER — Telehealth: Payer: Self-pay | Admitting: *Deleted

## 2016-01-06 VITALS — BP 116/74 | HR 112 | Temp 98.5°F | Resp 18

## 2016-01-06 DIAGNOSIS — Z5111 Encounter for antineoplastic chemotherapy: Secondary | ICD-10-CM | POA: Diagnosis present

## 2016-01-06 DIAGNOSIS — C3401 Malignant neoplasm of right main bronchus: Secondary | ICD-10-CM

## 2016-01-06 DIAGNOSIS — C342 Malignant neoplasm of middle lobe, bronchus or lung: Secondary | ICD-10-CM

## 2016-01-06 DIAGNOSIS — Z5189 Encounter for other specified aftercare: Secondary | ICD-10-CM

## 2016-01-06 DIAGNOSIS — C3491 Malignant neoplasm of unspecified part of right bronchus or lung: Secondary | ICD-10-CM

## 2016-01-06 LAB — CBC WITH DIFFERENTIAL/PLATELET
BASO%: 0.3 % (ref 0.0–2.0)
Basophils Absolute: 0 10*3/uL (ref 0.0–0.1)
EOS%: 0.6 % (ref 0.0–7.0)
Eosinophils Absolute: 0 10*3/uL (ref 0.0–0.5)
HEMATOCRIT: 26.7 % — AB (ref 34.8–46.6)
HGB: 9 g/dL — ABNORMAL LOW (ref 11.6–15.9)
LYMPH#: 0.4 10*3/uL — AB (ref 0.9–3.3)
LYMPH%: 12.5 % — ABNORMAL LOW (ref 14.0–49.7)
MCH: 32.5 pg (ref 25.1–34.0)
MCHC: 33.7 g/dL (ref 31.5–36.0)
MCV: 96.4 fL (ref 79.5–101.0)
MONO#: 0.3 10*3/uL (ref 0.1–0.9)
MONO%: 8.4 % (ref 0.0–14.0)
NEUT#: 2.5 10*3/uL (ref 1.5–6.5)
NEUT%: 78.2 % — AB (ref 38.4–76.8)
Platelets: 153 10*3/uL (ref 145–400)
RBC: 2.77 10*6/uL — AB (ref 3.70–5.45)
RDW: 16.6 % — ABNORMAL HIGH (ref 11.2–14.5)
WBC: 3.2 10*3/uL — ABNORMAL LOW (ref 3.9–10.3)

## 2016-01-06 LAB — COMPREHENSIVE METABOLIC PANEL
ALT: 9 U/L (ref 0–55)
AST: 14 U/L (ref 5–34)
Albumin: 3.2 g/dL — ABNORMAL LOW (ref 3.5–5.0)
Alkaline Phosphatase: 94 U/L (ref 40–150)
Anion Gap: 9 mEq/L (ref 3–11)
BUN: 6.4 mg/dL — AB (ref 7.0–26.0)
CALCIUM: 9.5 mg/dL (ref 8.4–10.4)
CHLORIDE: 108 meq/L (ref 98–109)
CO2: 26 meq/L (ref 22–29)
CREATININE: 0.9 mg/dL (ref 0.6–1.1)
EGFR: >90 mL/min/{1.73_m2} (ref >90)
Glucose: 101 mg/dl (ref 70–140)
POTASSIUM: 3.2 meq/L — AB (ref 3.5–5.1)
SODIUM: 142 meq/L (ref 136–145)
Total Bilirubin: 0.45 mg/dL (ref 0.20–1.20)
Total Protein: 7.6 g/dL (ref 6.4–8.3)

## 2016-01-06 MED ORDER — SODIUM CHLORIDE 0.9 % IV SOLN
680.0000 mg | Freq: Once | INTRAVENOUS | Status: AC
  Administered 2016-01-06: 680 mg via INTRAVENOUS
  Filled 2016-01-06: qty 68

## 2016-01-06 MED ORDER — PACLITAXEL CHEMO INJECTION 300 MG/50ML: 150.0000 mg/m2 | Freq: Once | INTRAVENOUS | Status: DC

## 2016-01-06 MED ORDER — SODIUM CHLORIDE 0.9 % IV SOLN
20.0000 mg | Freq: Once | INTRAVENOUS | Status: AC
  Administered 2016-01-06: 20 mg via INTRAVENOUS
  Filled 2016-01-06: qty 2

## 2016-01-06 MED ORDER — FAMOTIDINE IN NACL 20-0.9 MG/50ML-% IV SOLN
20.0000 mg | Freq: Once | INTRAVENOUS | Status: AC
  Administered 2016-01-06: 20 mg via INTRAVENOUS

## 2016-01-06 MED ORDER — PALONOSETRON HCL INJECTION 0.25 MG/5ML
0.2500 mg | Freq: Once | INTRAVENOUS | Status: AC
  Administered 2016-01-06: 0.25 mg via INTRAVENOUS

## 2016-01-06 MED ORDER — SODIUM CHLORIDE 0.9 % IV SOLN
Freq: Once | INTRAVENOUS | Status: AC
  Administered 2016-01-06: 14:00:00 via INTRAVENOUS

## 2016-01-06 MED ORDER — DIPHENHYDRAMINE HCL 50 MG/ML IJ SOLN
INTRAMUSCULAR | Status: AC
  Filled 2016-01-06: qty 1

## 2016-01-06 MED ORDER — HEPARIN SOD (PORK) LOCK FLUSH 100 UNIT/ML IV SOLN
500.0000 [IU] | Freq: Once | INTRAVENOUS | Status: DC | PRN
  Filled 2016-01-06: qty 5

## 2016-01-06 MED ORDER — FAMOTIDINE IN NACL 20-0.9 MG/50ML-% IV SOLN
INTRAVENOUS | Status: AC
  Filled 2016-01-06: qty 50

## 2016-01-06 MED ORDER — PACLITAXEL CHEMO INJECTION 300 MG/50ML
150.0000 mg/m2 | Freq: Once | INTRAVENOUS | Status: AC
  Administered 2016-01-06: 324 mg via INTRAVENOUS
  Filled 2016-01-06: qty 54

## 2016-01-06 MED ORDER — PALONOSETRON HCL INJECTION 0.25 MG/5ML
INTRAVENOUS | Status: AC
  Filled 2016-01-06: qty 5

## 2016-01-06 MED ORDER — DIPHENHYDRAMINE HCL 50 MG/ML IJ SOLN
50.0000 mg | Freq: Once | INTRAMUSCULAR | Status: AC
  Administered 2016-01-06: 50 mg via INTRAVENOUS

## 2016-01-06 MED ORDER — PEGFILGRASTIM 6 MG/0.6ML ~~LOC~~ PSKT
6.0000 mg | PREFILLED_SYRINGE | Freq: Once | SUBCUTANEOUS | Status: AC
  Administered 2016-01-06: 6 mg via SUBCUTANEOUS
  Filled 2016-01-06: qty 0.6

## 2016-01-06 MED ORDER — SODIUM CHLORIDE 0.9% FLUSH
10.0000 mL | INTRAVENOUS | Status: DC | PRN
  Filled 2016-01-06: qty 10

## 2016-01-06 MED ORDER — SODIUM CHLORIDE 0.9 % IV SOLN: 150.0000 mg/m2 | Freq: Once | INTRAVENOUS | Status: DC

## 2016-01-06 NOTE — Progress Notes (Signed)
Quick Note:  Call patient with the result and encourage K rich diet ______ 

## 2016-01-06 NOTE — Progress Notes (Signed)
OK to treat with CMET from 12/30/15 per Dr. Julien Nordmann.

## 2016-01-06 NOTE — Patient Instructions (Addendum)
[facility] Discharge Instructions for Patients Receiving Chemotherapy  Today you received the following chemotherapy agents: Taxol and Carboplatin.  To help prevent nausea and vomiting after your treatment, we encourage you to take your nausea medication as prescribed.   If you develop nausea and vomiting that is not controlled by your nausea medication, call the clinic.   BELOW ARE SYMPTOMS THAT SHOULD BE REPORTED IMMEDIATELY:  *FEVER GREATER THAN 100.5 F  *CHILLS WITH OR WITHOUT FEVER  NAUSEA AND VOMITING THAT IS NOT CONTROLLED WITH YOUR NAUSEA MEDICATION  *UNUSUAL SHORTNESS OF BREATH  *UNUSUAL BRUISING OR BLEEDING  TENDERNESS IN MOUTH AND THROAT WITH OR WITHOUT PRESENCE OF ULCERS  *URINARY PROBLEMS  *BOWEL PROBLEMS  UNUSUAL RASH Items with * indicate a potential emergency and should be followed up as soon as possible.  Feel free to call the clinic you have any questions or concerns. The clinic phone number is [phone].  Please show the [facility] at check-in to the Emergency Department and triage nurse.    TAKE CLARITIN (LORATADINE) TONIGHT BEFORE BED TIME AND TAKE DAILY FOR 7 DAYS TO PREVENT ACHES AND PAINS AFTER NEULASTA INJECTION.

## 2016-01-06 NOTE — Telephone Encounter (Signed)
Called pt encouraged K+ rich diet. Pt currently in infusion room/ This RN walked a list over to pt in infusion room with K+ rich foods.

## 2016-01-06 NOTE — Telephone Encounter (Signed)
-----   Message from Curt Bears, MD sent at 01/06/2016  3:08 PM EDT ----- Call patient with the result and encourage K rich diet

## 2016-01-13 ENCOUNTER — Telehealth: Payer: Self-pay | Admitting: Medical Oncology

## 2016-01-13 ENCOUNTER — Other Ambulatory Visit (HOSPITAL_BASED_OUTPATIENT_CLINIC_OR_DEPARTMENT_OTHER): Payer: Medicare Other

## 2016-01-13 DIAGNOSIS — C3491 Malignant neoplasm of unspecified part of right bronchus or lung: Secondary | ICD-10-CM

## 2016-01-13 DIAGNOSIS — E876 Hypokalemia: Secondary | ICD-10-CM

## 2016-01-13 DIAGNOSIS — C3401 Malignant neoplasm of right main bronchus: Secondary | ICD-10-CM | POA: Diagnosis present

## 2016-01-13 DIAGNOSIS — C342 Malignant neoplasm of middle lobe, bronchus or lung: Secondary | ICD-10-CM

## 2016-01-13 LAB — CBC WITH DIFFERENTIAL/PLATELET
BASO%: 0.6 % (ref 0.0–2.0)
Basophils Absolute: 0 10*3/uL (ref 0.0–0.1)
EOS ABS: 0 10*3/uL (ref 0.0–0.5)
EOS%: 0.2 % (ref 0.0–7.0)
HCT: 27.7 % — ABNORMAL LOW (ref 34.8–46.6)
HEMOGLOBIN: 9 g/dL — AB (ref 11.6–15.9)
LYMPH%: 17.5 % (ref 14.0–49.7)
MCH: 31.8 pg (ref 25.1–34.0)
MCHC: 32.5 g/dL (ref 31.5–36.0)
MCV: 97.9 fL (ref 79.5–101.0)
MONO#: 0.3 10*3/uL (ref 0.1–0.9)
MONO%: 12.3 % (ref 0.0–14.0)
NEUT%: 69.4 % (ref 38.4–76.8)
NEUTROS ABS: 1.7 10*3/uL (ref 1.5–6.5)
Platelets: 85 10*3/uL — ABNORMAL LOW (ref 145–400)
RBC: 2.83 10*6/uL — ABNORMAL LOW (ref 3.70–5.45)
RDW: 17.1 % — AB (ref 11.2–14.5)
WBC: 2.5 10*3/uL — AB (ref 3.9–10.3)
lymph#: 0.4 10*3/uL — ABNORMAL LOW (ref 0.9–3.3)

## 2016-01-13 LAB — COMPREHENSIVE METABOLIC PANEL
ALBUMIN: 3.4 g/dL — AB (ref 3.5–5.0)
ALK PHOS: 90 U/L (ref 40–150)
ALT: 10 U/L (ref 0–55)
AST: 14 U/L (ref 5–34)
Anion Gap: 11 mEq/L (ref 3–11)
BILIRUBIN TOTAL: 0.69 mg/dL (ref 0.20–1.20)
BUN: 10.4 mg/dL (ref 7.0–26.0)
CO2: 28 mEq/L (ref 22–29)
Calcium: 9.8 mg/dL (ref 8.4–10.4)
Chloride: 101 mEq/L (ref 98–109)
Creatinine: 0.9 mg/dL (ref 0.6–1.1)
EGFR: 89 mL/min/{1.73_m2} — ABNORMAL LOW (ref >90)
GLUCOSE: 125 mg/dL (ref 70–140)
Potassium: 3.1 mEq/L — ABNORMAL LOW (ref 3.5–5.1)
SODIUM: 139 meq/L (ref 136–145)
TOTAL PROTEIN: 7.6 g/dL (ref 6.4–8.3)

## 2016-01-13 MED ORDER — HYDROCODONE-HOMATROPINE 5-1.5 MG/5ML PO SYRP: 5.0000 mL | ORAL_SOLUTION | Freq: Four times a day (QID) | ORAL | Status: DC | PRN

## 2016-01-13 MED ORDER — POTASSIUM CHLORIDE CRYS ER 20 MEQ PO TBCR
20.0000 meq | EXTENDED_RELEASE_TABLET | Freq: Every day | ORAL | Status: DC
Start: 2016-01-13 — End: 2016-08-03

## 2016-01-13 NOTE — Progress Notes (Signed)
Quick Note:  Call patient with the result and order K Dur 20 meq po qd X 7 days ______ 

## 2016-01-13 NOTE — Telephone Encounter (Signed)
Pt.notified

## 2016-01-13 NOTE — Telephone Encounter (Signed)
-----   Message from Curt Bears, MD sent at 01/13/2016  1:31 PM EDT ----- Call patient with the result and order K Dur 20 meq po qd X 7 days.

## 2016-01-20 ENCOUNTER — Other Ambulatory Visit (HOSPITAL_BASED_OUTPATIENT_CLINIC_OR_DEPARTMENT_OTHER): Payer: Medicare Other

## 2016-01-20 ENCOUNTER — Ambulatory Visit: Payer: Medicare Other

## 2016-01-20 DIAGNOSIS — C3401 Malignant neoplasm of right main bronchus: Secondary | ICD-10-CM

## 2016-01-20 DIAGNOSIS — C3491 Malignant neoplasm of unspecified part of right bronchus or lung: Secondary | ICD-10-CM

## 2016-01-20 LAB — COMPREHENSIVE METABOLIC PANEL
ALBUMIN: 3.2 g/dL — AB (ref 3.5–5.0)
ALK PHOS: 93 U/L (ref 40–150)
ALT: 10 U/L (ref 0–55)
ANION GAP: 8 meq/L (ref 3–11)
AST: 14 U/L (ref 5–34)
BUN: 7.6 mg/dL (ref 7.0–26.0)
CALCIUM: 9.4 mg/dL (ref 8.4–10.4)
CO2: 27 mEq/L (ref 22–29)
CREATININE: 0.9 mg/dL (ref 0.6–1.1)
Chloride: 106 mEq/L (ref 98–109)
EGFR: >90 mL/min/{1.73_m2} (ref >90)
Glucose: 113 mg/dl (ref 70–140)
POTASSIUM: 3.4 meq/L — AB (ref 3.5–5.1)
Sodium: 141 mEq/L (ref 136–145)
Total Bilirubin: 0.37 mg/dL (ref 0.20–1.20)
Total Protein: 7.1 g/dL (ref 6.4–8.3)

## 2016-01-20 LAB — CBC WITH DIFFERENTIAL/PLATELET
BASO%: 0.5 % (ref 0.0–2.0)
BASOS ABS: 0 10*3/uL (ref 0.0–0.1)
EOS ABS: 0 10*3/uL (ref 0.0–0.5)
EOS%: 0.2 % (ref 0.0–7.0)
HEMATOCRIT: 25.2 % — AB (ref 34.8–46.6)
HEMOGLOBIN: 8.3 g/dL — AB (ref 11.6–15.9)
LYMPH#: 0.4 10*3/uL — AB (ref 0.9–3.3)
LYMPH%: 15.4 % (ref 14.0–49.7)
MCH: 32.5 pg (ref 25.1–34.0)
MCHC: 33 g/dL (ref 31.5–36.0)
MCV: 98.4 fL (ref 79.5–101.0)
MONO#: 0.4 10*3/uL (ref 0.1–0.9)
MONO%: 12.6 % (ref 0.0–14.0)
NEUT#: 2 10*3/uL (ref 1.5–6.5)
NEUT%: 71.3 % (ref 38.4–76.8)
Platelets: 111 10*3/uL — ABNORMAL LOW (ref 145–400)
RBC: 2.56 10*6/uL — ABNORMAL LOW (ref 3.70–5.45)
RDW: 17.8 % — AB (ref 11.2–14.5)
WBC: 2.8 10*3/uL — ABNORMAL LOW (ref 3.9–10.3)

## 2016-01-24 ENCOUNTER — Ambulatory Visit (HOSPITAL_COMMUNITY)
Admission: RE | Admit: 2016-01-24 | Discharge: 2016-01-24 | Disposition: A | Discharge status: Home / Self Care | Payer: Medicare Other | Source: Ambulatory Visit | Attending: Internal Medicine | Admitting: Internal Medicine

## 2016-01-24 ENCOUNTER — Encounter (HOSPITAL_COMMUNITY): Payer: Self-pay

## 2016-01-24 DIAGNOSIS — J90 Pleural effusion, not elsewhere classified: Secondary | ICD-10-CM | POA: Diagnosis not present

## 2016-01-24 DIAGNOSIS — R911 Solitary pulmonary nodule: Secondary | ICD-10-CM | POA: Insufficient documentation

## 2016-01-24 DIAGNOSIS — C3411 Malignant neoplasm of upper lobe, right bronchus or lung: Secondary | ICD-10-CM | POA: Diagnosis not present

## 2016-01-24 DIAGNOSIS — Z9221 Personal history of antineoplastic chemotherapy: Secondary | ICD-10-CM | POA: Insufficient documentation

## 2016-01-24 DIAGNOSIS — C3491 Malignant neoplasm of unspecified part of right bronchus or lung: Secondary | ICD-10-CM

## 2016-01-24 DIAGNOSIS — K449 Diaphragmatic hernia without obstruction or gangrene: Secondary | ICD-10-CM | POA: Diagnosis not present

## 2016-01-24 DIAGNOSIS — Z5111 Encounter for antineoplastic chemotherapy: Secondary | ICD-10-CM

## 2016-01-24 DIAGNOSIS — Q278 Other specified congenital malformations of peripheral vascular system: Secondary | ICD-10-CM | POA: Diagnosis not present

## 2016-01-24 MED ORDER — IOPAMIDOL (ISOVUE-300) INJECTION 61%
75.0000 mL | Freq: Once | INTRAVENOUS | Status: AC | PRN
  Administered 2016-01-24: 75 mL via INTRAVENOUS

## 2016-01-27 ENCOUNTER — Other Ambulatory Visit (HOSPITAL_BASED_OUTPATIENT_CLINIC_OR_DEPARTMENT_OTHER): Payer: Medicare Other

## 2016-01-27 ENCOUNTER — Ambulatory Visit: Payer: Medicare Other

## 2016-01-27 ENCOUNTER — Encounter: Payer: Self-pay | Admitting: Internal Medicine

## 2016-01-27 ENCOUNTER — Telehealth: Payer: Self-pay | Admitting: Internal Medicine

## 2016-01-27 ENCOUNTER — Ambulatory Visit (HOSPITAL_BASED_OUTPATIENT_CLINIC_OR_DEPARTMENT_OTHER): Payer: Medicare Other | Admitting: Internal Medicine

## 2016-01-27 VITALS — BP 138/79 | HR 120 | Temp 98.2°F | Resp 20 | Ht 68.0 in | Wt 206.9 lb

## 2016-01-27 DIAGNOSIS — E876 Hypokalemia: Secondary | ICD-10-CM

## 2016-01-27 DIAGNOSIS — R05 Cough: Secondary | ICD-10-CM

## 2016-01-27 DIAGNOSIS — C342 Malignant neoplasm of middle lobe, bronchus or lung: Secondary | ICD-10-CM

## 2016-01-27 DIAGNOSIS — C3481 Malignant neoplasm of overlapping sites of right bronchus and lung: Secondary | ICD-10-CM

## 2016-01-27 DIAGNOSIS — C3491 Malignant neoplasm of unspecified part of right bronchus or lung: Secondary | ICD-10-CM

## 2016-01-27 LAB — COMPREHENSIVE METABOLIC PANEL
ALBUMIN: 3.4 g/dL — AB (ref 3.5–5.0)
ALK PHOS: 89 U/L (ref 40–150)
ALT: 11 U/L (ref 0–55)
ANION GAP: 10 meq/L (ref 3–11)
AST: 13 U/L (ref 5–34)
BUN: 7.2 mg/dL (ref 7.0–26.0)
CALCIUM: 10 mg/dL (ref 8.4–10.4)
CO2: 24 mEq/L (ref 22–29)
Chloride: 107 mEq/L (ref 98–109)
Creatinine: 0.8 mg/dL (ref 0.6–1.1)
Glucose: 120 mg/dl (ref 70–140)
POTASSIUM: 3.8 meq/L (ref 3.5–5.1)
SODIUM: 141 meq/L (ref 136–145)
Total Bilirubin: 0.53 mg/dL (ref 0.20–1.20)
Total Protein: 7.6 g/dL (ref 6.4–8.3)

## 2016-01-27 LAB — CBC WITH DIFFERENTIAL/PLATELET
BASO%: 0.5 % (ref 0.0–2.0)
BASOS ABS: 0 10*3/uL (ref 0.0–0.1)
EOS ABS: 0 10*3/uL (ref 0.0–0.5)
EOS%: 0.1 % (ref 0.0–7.0)
HEMATOCRIT: 26 % — AB (ref 34.8–46.6)
HEMOGLOBIN: 8.6 g/dL — AB (ref 11.6–15.9)
LYMPH%: 20.8 % (ref 14.0–49.7)
MCH: 32.8 pg (ref 25.1–34.0)
MCHC: 33 g/dL (ref 31.5–36.0)
MCV: 99.5 fL (ref 79.5–101.0)
MONO#: 0.4 10*3/uL (ref 0.1–0.9)
MONO%: 16.6 % — AB (ref 0.0–14.0)
NEUT#: 1.4 10*3/uL — ABNORMAL LOW (ref 1.5–6.5)
NEUT%: 62 % (ref 38.4–76.8)
PLATELETS: 119 10*3/uL — AB (ref 145–400)
RBC: 2.61 10*6/uL — ABNORMAL LOW (ref 3.70–5.45)
RDW: 19.7 % — AB (ref 11.2–14.5)
WBC: 2.3 10*3/uL — ABNORMAL LOW (ref 3.9–10.3)
lymph#: 0.5 10*3/uL — ABNORMAL LOW (ref 0.9–3.3)

## 2016-01-27 MED ORDER — HYDROCODONE-HOMATROPINE 5-1.5 MG/5ML PO SYRP: 5.0000 mL | ORAL_SOLUTION | Freq: Four times a day (QID) | ORAL | Status: DC | PRN

## 2016-01-27 NOTE — Telephone Encounter (Signed)
Gave pt appt & avs °

## 2016-01-27 NOTE — Progress Notes (Signed)
[facility] Telephone:([phone]) [phone]   Fax:(336) [phone]  OFFICE PROGRESS NOTE  No PCP Per Patient No address on file  DIAGNOSIS: Stage IIIA (T2b, N2, M0) non-small cell lung cancer, invasive poorly differentiated squamous cell carcinoma diagnosed in November 2016 and presented with large right hilar mass with collapse of the right middle lobe and extension into the right upper lobe and right lower lobe along the hilum with right paratracheal and subcarinal lymphadenopathy.   PRIOR THERAPY:  1) Concurrent chemoradiation with weekly carboplatin for AUC of 2 and paclitaxel 45 MG/M2. First dose 08/19/2015. Status post 5 cycles. Last cycle was given 10/07/2015 with significant response. 2) Consolidation chemotherapy with carboplatin for AUC of 5 and paclitaxel 175 MG/M2 every 3 weeks with Neulasta support. First dose 11/18/2015. Status post 3 cycles.   CURRENT THERAPY: Observation.  INTERVAL HISTORY: Theresa [name] 55 y.o. female returns to the clinic today for follow-up visit. The patient completed a course of consolidation chemotherapy with carboplatin and paclitaxel and tolerated it fairly well. The patient is feeling well today with no specific complaints. She continues to have mild peripheral neuropathy. She also has dry cough. She denied having any significant weight loss or night sweats. She has no chest pain, shortness of breath or hemoptysis. She has no nausea or vomiting, no fever or chills. She had repeat CT scan of the chest performed recently and she is here for evaluation and discussion of her scan results.  MEDICAL HISTORY: Past Medical History  Diagnosis Date  . Pneumonia   . Cancer ([place])     5 years ago - cervical cancer  . Non-small cell carcinoma of lung, stage 3 ([place]) 08/08/2015  . Encounter for antineoplastic chemotherapy 08/27/2015  . Paroxysmal atrial flutter ([place]) 09/16/2015  . Hypokalemia 09/30/2015    ALLERGIES:  has No Known  Allergies.  MEDICATIONS:  Current Outpatient Prescriptions  Medication Sig Dispense Refill  . benzonatate (TESSALON) 100 MG capsule Take 1 capsule (100 mg total) by mouth 3 (three) times daily as needed for cough. 30 capsule 0  . HYDROcodone-homatropine (HYCODAN) 5-1.5 MG/5ML syrup Take 5 mLs by mouth every 6 (six) hours as needed for cough. 120 mL 0  . metoprolol tartrate (LOPRESSOR) 25 MG tablet Take 1 tablet (25 mg total) by mouth 2 (two) times daily. 60 tablet 3  . oxyCODONE-acetaminophen (PERCOCET/ROXICET) 5-325 MG tablet Take 1 tablet by mouth every 6 (six) hours as needed for severe pain. 40 tablet 0  . potassium chloride SA (K-DUR,KLOR-CON) 20 MEQ tablet Take 1 tablet (20 mEq total) by mouth daily. 7 tablet 0   No current facility-administered medications for this visit.    SURGICAL HISTORY:  Past Surgical History  Procedure Laterality Date  . Wisdom tooth extraction    . Tubal ligation    . Video bronchoscopy with endobronchial ultrasound N/A 08/02/2015    Procedure: VIDEO BRONCHOSCOPY WITH ENDOBRONCHIAL ULTRASOUND;  Surgeon: [name], MD;  Location: [facility];  Service: Thoracic;  Laterality: N/A;    REVIEW OF SYSTEMS:  Constitutional: positive for fatigue Eyes: negative Ears, nose, mouth, throat, and face: negative Respiratory: positive for cough Cardiovascular: negative Gastrointestinal: negative Genitourinary:negative Integument/breast: negative Hematologic/lymphatic: negative Musculoskeletal:negative Neurological: positive for paresthesia Behavioral/Psych: negative Endocrine: negative Allergic/Immunologic: negative   PHYSICAL EXAMINATION: General appearance: alert, cooperative and no distress Head: Normocephalic, without obvious abnormality, atraumatic Neck: no adenopathy, no JVD, supple, symmetrical, trachea midline and thyroid not enlarged, symmetric, no tenderness/mass/nodules Lymph nodes: Cervical, supraclavicular, and  axillary nodes normal. Resp:  clear to auscultation bilaterally Back: symmetric, no curvature. ROM normal. No CVA tenderness. Cardio: regular rate and rhythm, S1, S2 normal, no murmur, click, rub or gallop GI: soft, non-tender; bowel sounds normal; no masses,  no organomegaly Extremities: extremities normal, atraumatic, no cyanosis or edema Neurologic: Alert and oriented X 3, normal strength and tone. Normal symmetric reflexes. Normal coordination and gait  ECOG PERFORMANCE STATUS: 1 - Symptomatic but completely ambulatory  Blood pressure 138/79, pulse 120, temperature 98.2 F (36.8 C), temperature source Oral, resp. rate 20, height '5\' 8"'$  (1.727 m), weight 206 lb 14.4 oz (93.849 kg), SpO2 100 %.  LABORATORY DATA: Lab Results  Component Value Date   WBC 2.3* 01/27/2016   HGB 8.6* 01/27/2016   HCT 26.0* 01/27/2016   MCV 99.5 01/27/2016   PLT 119* 01/27/2016      Chemistry      Component Value Date/Time   NA 141 01/20/2016 0857   NA 141 10/11/2015 0530   K 3.4* 01/20/2016 0857   K 3.3* 10/11/2015 0530   CL 109 10/11/2015 0530   CO2 27 01/20/2016 0857   CO2 22 10/11/2015 0530   BUN 7.6 01/20/2016 0857   BUN 8 10/11/2015 0530   CREATININE 0.9 01/20/2016 0857   CREATININE 0.82 10/11/2015 0530      Component Value Date/Time   CALCIUM 9.4 01/20/2016 0857   CALCIUM 9.4 10/11/2015 0530   ALKPHOS 93 01/20/2016 0857   ALKPHOS 60 10/10/2015 2249   AST 14 01/20/2016 0857   AST 17 10/10/2015 2249   ALT 10 01/20/2016 0857   ALT 15 10/10/2015 2249   BILITOT 0.37 01/20/2016 0857   BILITOT 1.0 10/10/2015 2249       RADIOGRAPHIC STUDIES: Ct Chest W Contrast  01/24/2016  CLINICAL DATA:  Invasive poorly differentiated squamous cell lung carcinoma of the right upper lobe diagnosed November 2016 status post completion of radiation therapy in January 2017 and completion of chemotherapy in April 2017, presenting for restaging. EXAM: CT CHEST WITH CONTRAST TECHNIQUE: Multidetector CT imaging of the chest was performed  during intravenous contrast administration. CONTRAST:  57m ISOVUE-300 IOPAMIDOL (ISOVUE-300) INJECTION 61% COMPARISON:  11/07/2015 chest CT. FINDINGS: Mediastinum/Nodes: Normal heart size. Small amount of new fluid in the superior pericardial recess anterior to the proximal aortic arch. Minimally atherosclerotic nonaneurysmal thoracic aorta. Re- demonstration of aberrant right subclavian artery with retroesophageal course and stable mild ectasia at the origin. Normal caliber pulmonary arteries. No central pulmonary emboli. Normal visualized thyroid. Normal esophagus. No axillary adenopathy. No residual or new pathologically enlarged mediastinal or hilar nodes. Lungs/Pleura: No pneumothorax. Trace right pleural effusion in the upper right pleural space, slightly increased, possibly loculated. No left pleural effusion. Central right upper lobe 1.9 x 1.1 cm pulmonary nodule (series 2/ image 36) is decreased from 2.8 x 2.1 cm. There is a new large sharply marginated region of consolidation and patchy ground-glass opacity throughout the medial/parahilar right lung involving the upper, middle and lower lobes with internal air bronchograms and new volume loss, consistent with radiation change. Stable subpleural 4 mm right lower lobe pulmonary nodule (series 5/image 90), unchanged back to 07/12/2015, probably benign. No new significant pulmonary nodules. Upper abdomen: Tiny hiatal hernia. Simple 1.0 cm and 1.2 cm liver cyst at the liver dome, not appreciably changed back to at least 09/16/2015. Musculoskeletal: No aggressive appearing focal osseous lesions. Mild degenerative changes in the thoracic spine. IMPRESSION: 1. Continued reduction in central right upper lobe pulmonary nodule (the primary  lung malignancy site). 2. No residual thoracic adenopathy. 3. Significant new postradiation changes with volume loss throughout the parahilar right lung. 4. Trace right upper pleural effusion, slightly increased, possibly  loculated. 5. Aberrant right subclavian artery. 6. Tiny hiatal hernia. Electronically Signed   By: Ilona Sorrel M.D.   On: 01/24/2016 09:13    ASSESSMENT AND PLAN: This is a very pleasant 55 years old African-American female recently diagnosed with a stage IIIa non-small cell lung cancer, squamous cell carcinoma with large right hilar mass in addition to mediastinal lymphadenopathy diagnosed in November 2016. The patient completed a course of concurrent chemoradiation with weekly carboplatin and paclitaxel status post 5 cycles. This was followed by consolidation chemotherapy with 3 cycles of carboplatin and paclitaxel. The recent CT scan of the chest showed no evidence for disease progression. I discussed the scan results with the patient. I recommended for her to continue on observation with repeat CT scan of the chest in 3 months. For the dry cough, I gave the patient a refill of Hycodan. I also referred the patient to primary care physician to establish care with [facility]. She was advised to call immediately if she has any concerning symptoms in the interval. The patient voices understanding of current disease status and treatment options and is in agreement with the current care plan.  All questions were answered. The patient knows to call the clinic with any problems, questions or concerns. We can certainly see the patient much sooner if necessary.  Disclaimer: This note was dictated with voice recognition software. Similar sounding words can inadvertently be transcribed and may not be corrected upon review.

## 2016-02-05 ENCOUNTER — Telehealth: Payer: Self-pay

## 2016-02-05 NOTE — Telephone Encounter (Signed)
Patient requesting a refill on oxycodone and would like to pick it up today at 4:00 pm.

## 2016-02-06 ENCOUNTER — Other Ambulatory Visit: Payer: Self-pay | Admitting: Medical Oncology

## 2016-02-06 DIAGNOSIS — Z5111 Encounter for antineoplastic chemotherapy: Secondary | ICD-10-CM

## 2016-02-06 DIAGNOSIS — C3491 Malignant neoplasm of unspecified part of right bronchus or lung: Secondary | ICD-10-CM

## 2016-02-06 MED ORDER — OXYCODONE-ACETAMINOPHEN 5-325 MG PO TABS: 1.0000 | ORAL_TABLET | Freq: Four times a day (QID) | ORAL | Status: DC | PRN

## 2016-02-06 NOTE — Progress Notes (Signed)
rx ready for pick up -locked up .

## 2016-02-07 ENCOUNTER — Telehealth: Payer: Self-pay | Admitting: Medical Oncology

## 2016-02-07 NOTE — Telephone Encounter (Signed)
Pt continues to report numbness in left toes and ball of L foot. Denies pain , swelling or redness.  Chemo completed on 01/06/16- Currently on observation .Per Julien Nordmann I told pt to monitor symptom  for now . It may improve with time and to call us if she has worsening numbness or tripping . I told her to inspect her toes, bottom of feet  for any cuts and to take care of her feet routinely and to keep appt in august.

## 2016-03-19 ENCOUNTER — Other Ambulatory Visit: Payer: Self-pay | Admitting: Medical Oncology

## 2016-03-19 DIAGNOSIS — Z5111 Encounter for antineoplastic chemotherapy: Secondary | ICD-10-CM

## 2016-03-19 DIAGNOSIS — C3491 Malignant neoplasm of unspecified part of right bronchus or lung: Secondary | ICD-10-CM

## 2016-03-19 MED ORDER — OXYCODONE-ACETAMINOPHEN 5-325 MG PO TABS: 1.0000 | ORAL_TABLET | Freq: Four times a day (QID) | ORAL | Status: DC | PRN

## 2016-04-21 ENCOUNTER — Other Ambulatory Visit (HOSPITAL_BASED_OUTPATIENT_CLINIC_OR_DEPARTMENT_OTHER): Payer: Medicare Other

## 2016-04-21 ENCOUNTER — Encounter (HOSPITAL_COMMUNITY): Payer: Self-pay

## 2016-04-21 ENCOUNTER — Ambulatory Visit (HOSPITAL_COMMUNITY)
Admission: RE | Admit: 2016-04-21 | Discharge: 2016-04-21 | Disposition: A | Discharge status: Home / Self Care | Payer: Medicare Other | Source: Ambulatory Visit | Attending: Internal Medicine | Admitting: Internal Medicine

## 2016-04-21 DIAGNOSIS — J9811 Atelectasis: Secondary | ICD-10-CM | POA: Insufficient documentation

## 2016-04-21 DIAGNOSIS — C342 Malignant neoplasm of middle lobe, bronchus or lung: Secondary | ICD-10-CM | POA: Diagnosis not present

## 2016-04-21 DIAGNOSIS — R918 Other nonspecific abnormal finding of lung field: Secondary | ICD-10-CM | POA: Insufficient documentation

## 2016-04-21 DIAGNOSIS — J189 Pneumonia, unspecified organism: Secondary | ICD-10-CM | POA: Diagnosis not present

## 2016-04-21 DIAGNOSIS — C3401 Malignant neoplasm of right main bronchus: Secondary | ICD-10-CM | POA: Diagnosis present

## 2016-04-21 DIAGNOSIS — E876 Hypokalemia: Secondary | ICD-10-CM

## 2016-04-21 DIAGNOSIS — C3491 Malignant neoplasm of unspecified part of right bronchus or lung: Secondary | ICD-10-CM

## 2016-04-21 LAB — CBC WITH DIFFERENTIAL/PLATELET
BASO%: 0.7 % (ref 0.0–2.0)
Basophils Absolute: 0 10e3/uL (ref 0.0–0.1)
EOS%: 1.3 % (ref 0.0–7.0)
Eosinophils Absolute: 0 10e3/uL (ref 0.0–0.5)
HCT: 30.7 % — ABNORMAL LOW (ref 34.8–46.6)
HGB: 10.2 g/dL — ABNORMAL LOW (ref 11.6–15.9)
LYMPH%: 28.8 % (ref 14.0–49.7)
MCH: 32 pg (ref 25.1–34.0)
MCHC: 33.2 g/dL (ref 31.5–36.0)
MCV: 96.5 fL (ref 79.5–101.0)
MONO#: 0.3 10e3/uL (ref 0.1–0.9)
MONO%: 12.6 % (ref 0.0–14.0)
NEUT#: 1.4 10e3/uL — ABNORMAL LOW (ref 1.5–6.5)
NEUT%: 56.6 % (ref 38.4–76.8)
Platelets: 228 10e3/uL (ref 145–400)
RBC: 3.18 10e6/uL — ABNORMAL LOW (ref 3.70–5.45)
RDW: 16.9 % — ABNORMAL HIGH (ref 11.2–14.5)
WBC: 2.5 10e3/uL — ABNORMAL LOW (ref 3.9–10.3)
lymph#: 0.7 10e3/uL — ABNORMAL LOW (ref 0.9–3.3)

## 2016-04-21 LAB — COMPREHENSIVE METABOLIC PANEL WITH GFR
ALT: 17 U/L (ref 0–55)
AST: 20 U/L (ref 5–34)
Albumin: 3.6 g/dL (ref 3.5–5.0)
Alkaline Phosphatase: 88 U/L (ref 40–150)
Anion Gap: 9 meq/L (ref 3–11)
BUN: 9.5 mg/dL (ref 7.0–26.0)
CO2: 26 meq/L (ref 22–29)
Calcium: 9.8 mg/dL (ref 8.4–10.4)
Chloride: 108 meq/L (ref 98–109)
Creatinine: 0.9 mg/dL (ref 0.6–1.1)
EGFR: 88 ml/min/1.73 m2 — ABNORMAL LOW
Glucose: 104 mg/dL (ref 70–140)
Potassium: 3.4 meq/L — ABNORMAL LOW (ref 3.5–5.1)
Sodium: 142 meq/L (ref 136–145)
Total Bilirubin: 0.59 mg/dL (ref 0.20–1.20)
Total Protein: 7.7 g/dL (ref 6.4–8.3)

## 2016-04-21 MED ORDER — IOPAMIDOL (ISOVUE-300) INJECTION 61%
75.0000 mL | Freq: Once | INTRAVENOUS | Status: AC | PRN
  Administered 2016-04-21: 75 mL via INTRAVENOUS

## 2016-04-28 ENCOUNTER — Ambulatory Visit (HOSPITAL_BASED_OUTPATIENT_CLINIC_OR_DEPARTMENT_OTHER): Payer: Medicare Other | Admitting: Internal Medicine

## 2016-04-28 ENCOUNTER — Encounter: Payer: Self-pay | Admitting: Medical Oncology

## 2016-04-28 ENCOUNTER — Encounter: Payer: Self-pay | Admitting: Internal Medicine

## 2016-04-28 ENCOUNTER — Telehealth: Payer: Self-pay | Admitting: Internal Medicine

## 2016-04-28 VITALS — BP 134/70 | HR 108 | Temp 98.3°F | Resp 20 | Ht 68.0 in | Wt 218.1 lb

## 2016-04-28 DIAGNOSIS — R000 Tachycardia, unspecified: Secondary | ICD-10-CM

## 2016-04-28 DIAGNOSIS — C342 Malignant neoplasm of middle lobe, bronchus or lung: Secondary | ICD-10-CM

## 2016-04-28 DIAGNOSIS — E876 Hypokalemia: Secondary | ICD-10-CM | POA: Diagnosis not present

## 2016-04-28 DIAGNOSIS — C3481 Malignant neoplasm of overlapping sites of right bronchus and lung: Secondary | ICD-10-CM

## 2016-04-28 DIAGNOSIS — C3491 Malignant neoplasm of unspecified part of right bronchus or lung: Secondary | ICD-10-CM

## 2016-04-28 DIAGNOSIS — Z5111 Encounter for antineoplastic chemotherapy: Secondary | ICD-10-CM

## 2016-04-28 MED ORDER — OXYCODONE-ACETAMINOPHEN 5-325 MG PO TABS: 1.0000 | ORAL_TABLET | Freq: Four times a day (QID) | ORAL | 0 refills | Status: DC | PRN

## 2016-04-28 NOTE — Telephone Encounter (Signed)
Gave pt cal & avs °

## 2016-04-28 NOTE — Progress Notes (Signed)
[place] Telephone:(336) [phone]   Fax:(336) [phone]  OFFICE PROGRESS NOTE  No PCP Per Patient No address on file  DIAGNOSIS: Stage IIIA (T2b, N2, M0) non-small cell lung cancer, invasive poorly differentiated squamous cell carcinoma diagnosed in November 2016 and presented with large right hilar mass with collapse of the right middle lobe and extension into the right upper lobe and right lower lobe along the hilum with right paratracheal and subcarinal lymphadenopathy.   PRIOR THERAPY:  1) Concurrent chemoradiation with weekly carboplatin for AUC of 2 and paclitaxel 45 MG/M2. First dose 08/19/2015. Status post 5 cycles. Last cycle was given 10/07/2015 with significant response. 2) Consolidation chemotherapy with carboplatin for AUC of 5 and paclitaxel 175 MG/M2 every 3 weeks with Neulasta support. First dose 11/18/2015. Status post 3 cycles.   CURRENT THERAPY: Observation.  INTERVAL HISTORY: Theresa [name] 55 y.o. female returns to the clinic today for follow-up visit. The patient is currently on observation and feeling much better. She continues to have mild peripheral neuropathy. She denied having any significant weight loss or night sweats. She has no chest pain, shortness of breath or hemoptysis. She has no nausea or vomiting, no fever or chills. She would like to go back to work. She had repeat CT scan of the chest performed recently and she is here for evaluation and discussion of her scan results.  MEDICAL HISTORY: Past Medical History:  Diagnosis Date  . Cancer ([place])    5 years ago - cervical cancer  . Encounter for antineoplastic chemotherapy 08/27/2015  . Hypokalemia 09/30/2015  . Non-small cell carcinoma of lung, stage 3 ([place]) 08/08/2015  . Paroxysmal atrial flutter ([place]) 09/16/2015  . Pneumonia     ALLERGIES:  has No Known Allergies.  MEDICATIONS:  Current Outpatient Prescriptions  Medication Sig Dispense Refill  . benzonatate (TESSALON) 100  MG capsule Take 1 capsule (100 mg total) by mouth 3 (three) times daily as needed for cough. 30 capsule 0  . HYDROcodone-homatropine (HYCODAN) 5-1.5 MG/5ML syrup Take 5 mLs by mouth every 6 (six) hours as needed for cough. 120 mL 0  . metoprolol tartrate (LOPRESSOR) 25 MG tablet Take 1 tablet (25 mg total) by mouth 2 (two) times daily. 60 tablet 3  . oxyCODONE-acetaminophen (PERCOCET/ROXICET) 5-325 MG tablet Take 1 tablet by mouth every 6 (six) hours as needed for severe pain. 40 tablet 0  . potassium chloride SA (K-DUR,KLOR-CON) 20 MEQ tablet Take 1 tablet (20 mEq total) by mouth daily. 7 tablet 0   No current facility-administered medications for this visit.     SURGICAL HISTORY:  Past Surgical History:  Procedure Laterality Date  . TUBAL LIGATION    . VIDEO BRONCHOSCOPY WITH ENDOBRONCHIAL ULTRASOUND N/A 08/02/2015   Procedure: VIDEO BRONCHOSCOPY WITH ENDOBRONCHIAL ULTRASOUND;  Surgeon: [name], MD;  Location: [facility];  Service: Thoracic;  Laterality: N/A;  . WISDOM TOOTH EXTRACTION      REVIEW OF SYSTEMS:  A comprehensive review of systems was negative except for: Neurological: positive for paresthesia   PHYSICAL EXAMINATION: General appearance: alert, cooperative and no distress Head: Normocephalic, without obvious abnormality, atraumatic Neck: no adenopathy, no JVD, supple, symmetrical, trachea midline and thyroid not enlarged, symmetric, no tenderness/mass/nodules Lymph nodes: Cervical, supraclavicular, and axillary nodes normal. Resp: clear to auscultation bilaterally Back: symmetric, no curvature. ROM normal. No CVA tenderness. Cardio: regular rate and rhythm, S1, S2 normal, no murmur, click, rub or gallop GI: soft, non-tender; bowel sounds normal; no masses,  no organomegaly Extremities: extremities normal, atraumatic, no cyanosis or edema Neurologic: Alert and oriented X 3, normal strength and tone. Normal symmetric reflexes. Normal coordination and gait  ECOG  PERFORMANCE STATUS: 1 - Symptomatic but completely ambulatory  Blood pressure 134/70, pulse (!) 108, temperature 98.3 F (36.8 C), temperature source Oral, resp. rate 20, height '5\' 8"'$  (1.727 m), weight 218 lb 1.6 oz (98.9 kg), SpO2 100 %.  LABORATORY DATA: Lab Results  Component Value Date   WBC 2.5 (L) 04/21/2016   HGB 10.2 (L) 04/21/2016   HCT 30.7 (L) 04/21/2016   MCV 96.5 04/21/2016   PLT 228 04/21/2016      Chemistry      Component Value Date/Time   NA 142 04/21/2016 0925   K 3.4 (L) 04/21/2016 0925   CL 109 10/11/2015 0530   CO2 26 04/21/2016 0925   BUN 9.5 04/21/2016 0925   CREATININE 0.9 04/21/2016 0925      Component Value Date/Time   CALCIUM 9.8 04/21/2016 0925   ALKPHOS 88 04/21/2016 0925   AST 20 04/21/2016 0925   ALT 17 04/21/2016 0925   BILITOT 0.59 04/21/2016 0925       RADIOGRAPHIC STUDIES: Ct Chest W Contrast  Result Date: 04/21/2016 CLINICAL DATA:  Lung cancer diagnosed October 2016. Chemotherapy and radiation therapy complete. EXAM: CT CHEST WITH CONTRAST TECHNIQUE: Multidetector CT imaging of the chest was performed during intravenous contrast administration. CONTRAST:  46m ISOVUE-300 IOPAMIDOL (ISOVUE-300) INJECTION 61% COMPARISON:  01/23/2014 FINDINGS: Cardiovascular: No acute findings of the aorta great vessels. Aberrant RIGHT subclavian artery. Pericardial fluid. Mediastinum/Nodes: No axillary supraclavicular adenopathy. Ill-defined tissue planes in the mediastinum similar prior. Small fluid anterior to the aortic arch is similar prior. Lungs/Pleura: Pleural parenchymal thickening about the RIGHT hilum. There is perihilar fibrotic consolidation with air bronchograms not changed. There is linear atelectasis in the RIGHT middle lobe not changed. Volume loss the RIGHT hemi thorax not changed. No new nodularity. LEFT lung is clear. Upper Abdomen: Limited view of the liver, kidneys, pancreas are unremarkable. Normal adrenal glands. Musculoskeletal:  No   aggressive osseous lesion. IMPRESSION: 1. Stable post therapy change in the RIGHT hemi thorax with perihilar fibrotic consolidation with air bronchograms and atelectasis. 2. Fluid and ill-defined tissue planes in the mediastinum not changed prior. No clear evidence of local recurrence. 3. LEFT lung is clear. Electronically Signed   By: SSuzy BouchardM.D.   On: 04/21/2016 13:13    ASSESSMENT AND PLAN: This is a very pleasant 55years old African-American female recently diagnosed with a stage IIIa non-small cell lung cancer, squamous cell carcinoma with large right hilar mass in addition to mediastinal lymphadenopathy diagnosed in November 2016. The patient completed a course of concurrent chemoradiation with weekly carboplatin and paclitaxel status post 5 cycles. This was followed by consolidation chemotherapy with 3 cycles of carboplatin and paclitaxel. The most recent restaging scan of the chest showed no evidence for disease progression.  I discussed the scan results with the patient. I recommended for her to continue on observation with repeat CT scan of the chest in 3 months. She was given a refill of her pain medication today. She is scheduled to see a primary care physician in the next few weeks. For the tachycardia, I encouraged the patient to increase her by mouth intake and to reconsult with her cardiologist if heart rate stayed over 90.  For the hypokalemia, I encouraged the patient to increase her potassium which diet. She was advised to call immediately  if she has any concerning symptoms in the interval. The patient voices understanding of current disease status and treatment options and is in agreement with the current care plan.  All questions were answered. The patient knows to call the clinic with any problems, questions or concerns. We can certainly see the patient much sooner if necessary.  Disclaimer: This note was dictated with voice recognition software. Similar sounding  words can inadvertently be transcribed and may not be corrected upon review.

## 2016-04-30 ENCOUNTER — Other Ambulatory Visit: Payer: Self-pay | Admitting: Nurse Practitioner

## 2016-05-03 DIAGNOSIS — Z23 Encounter for immunization: Secondary | ICD-10-CM | POA: Diagnosis not present

## 2016-05-28 ENCOUNTER — Other Ambulatory Visit: Payer: Self-pay | Admitting: Medical Oncology

## 2016-05-28 DIAGNOSIS — C3491 Malignant neoplasm of unspecified part of right bronchus or lung: Secondary | ICD-10-CM

## 2016-05-28 DIAGNOSIS — Z5111 Encounter for antineoplastic chemotherapy: Secondary | ICD-10-CM

## 2016-05-28 MED ORDER — OXYCODONE-ACETAMINOPHEN 5-325 MG PO TABS: 1.0000 | ORAL_TABLET | Freq: Four times a day (QID) | ORAL | 0 refills | Status: DC | PRN

## 2016-07-27 ENCOUNTER — Other Ambulatory Visit (HOSPITAL_BASED_OUTPATIENT_CLINIC_OR_DEPARTMENT_OTHER): Payer: Medicare Other

## 2016-07-27 ENCOUNTER — Ambulatory Visit (HOSPITAL_COMMUNITY)
Admission: RE | Admit: 2016-07-27 | Discharge: 2016-07-27 | Disposition: A | Discharge status: Home / Self Care | Payer: Medicare Other | Source: Ambulatory Visit | Attending: Internal Medicine | Admitting: Internal Medicine

## 2016-07-27 ENCOUNTER — Encounter (HOSPITAL_COMMUNITY): Payer: Self-pay

## 2016-07-27 DIAGNOSIS — C342 Malignant neoplasm of middle lobe, bronchus or lung: Secondary | ICD-10-CM | POA: Insufficient documentation

## 2016-07-27 DIAGNOSIS — C3491 Malignant neoplasm of unspecified part of right bronchus or lung: Secondary | ICD-10-CM | POA: Diagnosis not present

## 2016-07-27 DIAGNOSIS — C3481 Malignant neoplasm of overlapping sites of right bronchus and lung: Secondary | ICD-10-CM

## 2016-07-27 DIAGNOSIS — R911 Solitary pulmonary nodule: Secondary | ICD-10-CM | POA: Insufficient documentation

## 2016-07-27 LAB — CBC WITH DIFFERENTIAL/PLATELET
BASO%: 0.7 % (ref 0.0–2.0)
BASOS ABS: 0 10*3/uL (ref 0.0–0.1)
EOS ABS: 0.1 10*3/uL (ref 0.0–0.5)
EOS%: 2.3 % (ref 0.0–7.0)
HEMATOCRIT: 34.8 % (ref 34.8–46.6)
HEMOGLOBIN: 11.4 g/dL — AB (ref 11.6–15.9)
LYMPH%: 31.3 % (ref 14.0–49.7)
MCH: 31.3 pg (ref 25.1–34.0)
MCHC: 32.8 g/dL (ref 31.5–36.0)
MCV: 95.5 fL (ref 79.5–101.0)
MONO#: 0.3 10*3/uL (ref 0.1–0.9)
MONO%: 11.2 % (ref 0.0–14.0)
NEUT#: 1.5 10*3/uL (ref 1.5–6.5)
NEUT%: 54.5 % (ref 38.4–76.8)
PLATELETS: 237 10*3/uL (ref 145–400)
RBC: 3.64 10*6/uL — ABNORMAL LOW (ref 3.70–5.45)
RDW: 16.8 % — AB (ref 11.2–14.5)
WBC: 2.7 10*3/uL — ABNORMAL LOW (ref 3.9–10.3)
lymph#: 0.8 10*3/uL — ABNORMAL LOW (ref 0.9–3.3)

## 2016-07-27 LAB — COMPREHENSIVE METABOLIC PANEL
ALBUMIN: 3.7 g/dL (ref 3.5–5.0)
ALK PHOS: 99 U/L (ref 40–150)
ALT: 13 U/L (ref 0–55)
ANION GAP: 10 meq/L (ref 3–11)
AST: 17 U/L (ref 5–34)
BILIRUBIN TOTAL: 0.45 mg/dL (ref 0.20–1.20)
BUN: 11.8 mg/dL (ref 7.0–26.0)
CALCIUM: 10.1 mg/dL (ref 8.4–10.4)
CO2: 24 mEq/L (ref 22–29)
Chloride: 107 mEq/L (ref 98–109)
Creatinine: 1.1 mg/dL (ref 0.6–1.1)
EGFR: 67 mL/min/{1.73_m2} — AB (ref >90)
Glucose: 108 mg/dl (ref 70–140)
POTASSIUM: 4.3 meq/L (ref 3.5–5.1)
SODIUM: 141 meq/L (ref 136–145)
TOTAL PROTEIN: 8 g/dL (ref 6.4–8.3)

## 2016-07-27 MED ORDER — IOPAMIDOL (ISOVUE-300) INJECTION 61%
75.0000 mL | Freq: Once | INTRAVENOUS | Status: AC | PRN
  Administered 2016-07-27: 75 mL via INTRAVENOUS

## 2016-08-03 ENCOUNTER — Telehealth: Payer: Self-pay | Admitting: Internal Medicine

## 2016-08-03 ENCOUNTER — Encounter: Payer: Self-pay | Admitting: Internal Medicine

## 2016-08-03 ENCOUNTER — Ambulatory Visit (HOSPITAL_BASED_OUTPATIENT_CLINIC_OR_DEPARTMENT_OTHER): Payer: Medicare Other | Admitting: Internal Medicine

## 2016-08-03 VITALS — BP 135/79 | HR 118 | Temp 98.5°F | Resp 18 | Ht 68.0 in | Wt 225.1 lb

## 2016-08-03 DIAGNOSIS — R000 Tachycardia, unspecified: Secondary | ICD-10-CM | POA: Diagnosis not present

## 2016-08-03 DIAGNOSIS — C342 Malignant neoplasm of middle lobe, bronchus or lung: Secondary | ICD-10-CM

## 2016-08-03 DIAGNOSIS — C3481 Malignant neoplasm of overlapping sites of right bronchus and lung: Secondary | ICD-10-CM | POA: Diagnosis not present

## 2016-08-03 NOTE — Progress Notes (Signed)
[place] Telephone:([phone]   Fax:(336) [phone]  OFFICE PROGRESS NOTE  No PCP Per Patient No address on file  DIAGNOSIS: Stage IIIA (T2b, N2, M0) non-small cell lung cancer, invasive poorly differentiated squamous cell carcinoma diagnosed in November 2016 and presented with large right hilar mass with collapse of the right middle lobe and extension into the right upper lobe and right lower lobe along the hilum with right paratracheal and subcarinal lymphadenopathy.   PRIOR THERAPY:  1) Concurrent chemoradiation with weekly carboplatin for AUC of 2 and paclitaxel 45 MG/M2. First dose 08/19/2015. Status post 5 cycles. Last cycle was given 10/07/2015 with significant response. 2) Consolidation chemotherapy with carboplatin for AUC of 5 and paclitaxel 175 MG/M2 every 3 weeks with Neulasta support. First dose 11/18/2015. Status post 3 cycles.   CURRENT THERAPY: Observation.  INTERVAL HISTORY: Theresa [name] 55 y.o. female returns to the clinic today for follow-up visit. The patient is currently on observation and has no complaints. The peripheral neuropathy improved in the fingers and she still have mild peripheral neuropathy in the toes. She denied having any significant weight loss or night sweats. She has no chest pain, shortness of breath or hemoptysis. She has no nausea or vomiting, no fever or chills. She would like to go back to work. She had repeat CT scan of the chest performed recently and she is here for evaluation and discussion of her scan results.  MEDICAL HISTORY: Past Medical History:  Diagnosis Date  . Cancer ([place])    5 years ago - cervical cancer  . Encounter for antineoplastic chemotherapy 08/27/2015  . Hypokalemia 09/30/2015  . Non-small cell carcinoma of lung, stage 3 ([place]) 08/08/2015  . Paroxysmal atrial flutter ([place]) 09/16/2015  . Pneumonia     ALLERGIES:  has No Known Allergies.  MEDICATIONS:  Current Outpatient Prescriptions    Medication Sig Dispense Refill  . benzonatate (TESSALON) 100 MG capsule Take 1 capsule (100 mg total) by mouth 3 (three) times daily as needed for cough. (Patient not taking: Reported on 04/28/2016) 30 capsule 0  . HYDROcodone-homatropine (HYCODAN) 5-1.5 MG/5ML syrup Take 5 mLs by mouth every 6 (six) hours as needed for cough. (Patient not taking: Reported on 04/28/2016) 120 mL 0  . metoprolol tartrate (LOPRESSOR) 25 MG tablet Take 1 tablet (25 mg total) by mouth 2 (two) times daily. (Patient not taking: Reported on 04/28/2016) 60 tablet 3  . oxyCODONE-acetaminophen (PERCOCET/ROXICET) 5-325 MG tablet Take 1 tablet by mouth every 6 (six) hours as needed for severe pain. 40 tablet 0  . potassium chloride SA (K-DUR,KLOR-CON) 20 MEQ tablet Take 1 tablet (20 mEq total) by mouth daily. (Patient not taking: Reported on 04/28/2016) 7 tablet 0   No current facility-administered medications for this visit.     SURGICAL HISTORY:  Past Surgical History:  Procedure Laterality Date  . TUBAL LIGATION    . VIDEO BRONCHOSCOPY WITH ENDOBRONCHIAL ULTRASOUND N/A 08/02/2015   Procedure: VIDEO BRONCHOSCOPY WITH ENDOBRONCHIAL ULTRASOUND;  Surgeon: [name], MD;  Location: [facility];  Service: Thoracic;  Laterality: N/A;  . WISDOM TOOTH EXTRACTION      REVIEW OF SYSTEMS:  Constitutional: negative Eyes: negative Ears, nose, mouth, throat, and face: negative Respiratory: negative Cardiovascular: negative Gastrointestinal: negative Genitourinary:negative Integument/breast: negative Hematologic/lymphatic: negative Musculoskeletal:negative Neurological: positive for paresthesia Behavioral/Psych: negative Endocrine: negative Allergic/Immunologic: negative   PHYSICAL EXAMINATION: General appearance: alert, cooperative and no distress Head: Normocephalic, without obvious abnormality, atraumatic Neck: no adenopathy, no JVD,  supple, symmetrical, trachea midline and thyroid not enlarged, symmetric, no  tenderness/mass/nodules Lymph nodes: Cervical, supraclavicular, and axillary nodes normal. Resp: clear to auscultation bilaterally Back: symmetric, no curvature. ROM normal. No CVA tenderness. Cardio: regular rate and rhythm, S1, S2 normal, no murmur, click, rub or gallop and Tachycardic GI: soft, non-tender; bowel sounds normal; no masses,  no organomegaly Extremities: extremities normal, atraumatic, no cyanosis or edema Neurologic: Alert and oriented X 3, normal strength and tone. Normal symmetric reflexes. Normal coordination and gait  ECOG PERFORMANCE STATUS: 1 - Symptomatic but completely ambulatory  Blood pressure 135/79, pulse (!) 118, temperature 98.5 F (36.9 C), temperature source Oral, resp. rate 18, height '5\' 8"'$  (1.727 m), weight 225 lb 1.6 oz (102.1 kg), SpO2 100 %.  LABORATORY DATA: Lab Results  Component Value Date   WBC 2.7 (L) 07/27/2016   HGB 11.4 (L) 07/27/2016   HCT 34.8 07/27/2016   MCV 95.5 07/27/2016   PLT 237 07/27/2016      Chemistry      Component Value Date/Time   NA 141 07/27/2016 0755   K 4.3 07/27/2016 0755   CL 109 10/11/2015 0530   CO2 24 07/27/2016 0755   BUN 11.8 07/27/2016 0755   CREATININE 1.1 07/27/2016 0755      Component Value Date/Time   CALCIUM 10.1 07/27/2016 0755   ALKPHOS 99 07/27/2016 0755   AST 17 07/27/2016 0755   ALT 13 07/27/2016 0755   BILITOT 0.45 07/27/2016 0755       RADIOGRAPHIC STUDIES: Ct Chest W Contrast  Result Date: 07/27/2016 CLINICAL DATA:  Lung cancer EXAM: CT CHEST WITH CONTRAST TECHNIQUE: Multidetector CT imaging of the chest was performed during intravenous contrast administration. CONTRAST:  85m ISOVUE-300 IOPAMIDOL (ISOVUE-300) INJECTION 61% COMPARISON:  04/21/2016 FINDINGS: Cardiovascular: Postradiation changes medial right lung and right hilum show no substantial change. Apparent origin right subclavian artery again noted. The heart size is normal. No pericardial effusion. Mediastinum/Nodes: No  mediastinal lymphadenopathy. There is no hilar lymphadenopathy. There is no axillary lymphadenopathy. Lungs/Pleura: Postradiation changes medial right lung, as before. Stable volume loss right hemi thorax. No substantial change 5 mm right lower lung nodule (image 94 series 5). No evidence of pulmonary edema or pleural effusion. Upper Abdomen: Scattered tiny hypodensities in the liver parenchyma are stable. One of the larger lesions is in the dome measuring 11 mm in diameter and water attenuation, compatible with a cyst. No adrenal nodule or mass. Musculoskeletal: Bone windows reveal no worrisome lytic or sclerotic osseous lesions. IMPRESSION: 1. Stable exam without new or progressive findings. 2. No change in post treatment scarring and volume loss right hemi thorax. 3. Stable 5 mm right lower lung nodule. Electronically Signed   By: EMisty StanleyM.D.   On: 07/27/2016 10:47    ASSESSMENT AND PLAN: This is a very pleasant 55years old African-American female recently diagnosed with a stage IIIA non-small cell lung cancer, squamous cell carcinoma with large right hilar mass in addition to mediastinal lymphadenopathy diagnosed in November 2016. The patient completed a course of concurrent chemoradiation with weekly carboplatin and paclitaxel status post 5 cycles. This was followed by consolidation chemotherapy with 3 cycles of carboplatin and paclitaxel. The most recent restaging scan of the chest showed no evidence for disease progression.  I discussed the scan results with the patient.  I recommended for her to continue on observation with repeat CT scan of the chest in 4 months. For the tachycardia, I will arrange for the patient to have  an EKG today and that is any concerning finding, I will refer her to cardiology for evaluation. She was advised to call immediately if she has any concerning symptoms in the interval. The patient voices understanding of current disease status and treatment options and is  in agreement with the current care plan.  All questions were answered. The patient knows to call the clinic with any problems, questions or concerns. We can certainly see the patient much sooner if necessary.  Disclaimer: This note was dictated with voice recognition software. Similar sounding words can inadvertently be transcribed and may not be corrected upon review.

## 2016-08-03 NOTE — Telephone Encounter (Signed)
Appointments scheduled per 08/03/16 los. AVS report and appointment schedule given to patient per 08/03/16 los.

## 2016-08-06 ENCOUNTER — Telehealth: Payer: Self-pay | Admitting: *Deleted

## 2016-08-06 NOTE — Telephone Encounter (Signed)
Pt called for refill on pain meds. Discussed pt's pain and location, pt advised she is tired after work and questioned if she should take percocet or if tylenol or aleeve was okay. Reviewed with MD, notified pt she is not on active chemotherapy ok to take tylenol or aleeve. Pt verbalized she will try either of these and does not wish to have pain med refilled at this time.

## 2016-09-17 ENCOUNTER — Ambulatory Visit: Payer: Medicare Other | Admitting: Nurse Practitioner

## 2016-11-26 ENCOUNTER — Other Ambulatory Visit (HOSPITAL_BASED_OUTPATIENT_CLINIC_OR_DEPARTMENT_OTHER): Payer: Medicare Other

## 2016-11-26 ENCOUNTER — Encounter (HOSPITAL_COMMUNITY): Payer: Self-pay

## 2016-11-26 ENCOUNTER — Ambulatory Visit (HOSPITAL_COMMUNITY)
Admission: RE | Admit: 2016-11-26 | Discharge: 2016-11-26 | Disposition: A | Discharge status: Home / Self Care | Payer: Medicare Other | Source: Ambulatory Visit | Attending: Internal Medicine | Admitting: Internal Medicine

## 2016-11-26 DIAGNOSIS — Z9889 Other specified postprocedural states: Secondary | ICD-10-CM | POA: Insufficient documentation

## 2016-11-26 DIAGNOSIS — R000 Tachycardia, unspecified: Secondary | ICD-10-CM

## 2016-11-26 DIAGNOSIS — C342 Malignant neoplasm of middle lobe, bronchus or lung: Secondary | ICD-10-CM

## 2016-11-26 DIAGNOSIS — R911 Solitary pulmonary nodule: Secondary | ICD-10-CM | POA: Diagnosis not present

## 2016-11-26 DIAGNOSIS — C3481 Malignant neoplasm of overlapping sites of right bronchus and lung: Secondary | ICD-10-CM

## 2016-11-26 DIAGNOSIS — R918 Other nonspecific abnormal finding of lung field: Secondary | ICD-10-CM | POA: Diagnosis not present

## 2016-11-26 DIAGNOSIS — C3490 Malignant neoplasm of unspecified part of unspecified bronchus or lung: Secondary | ICD-10-CM | POA: Diagnosis not present

## 2016-11-26 LAB — COMPREHENSIVE METABOLIC PANEL
ALBUMIN: 4 g/dL (ref 3.5–5.0)
ALK PHOS: 96 U/L (ref 40–150)
ALT: 14 U/L (ref 0–55)
ANION GAP: 10 meq/L (ref 3–11)
AST: 20 U/L (ref 5–34)
BILIRUBIN TOTAL: 0.58 mg/dL (ref 0.20–1.20)
BUN: 9.7 mg/dL (ref 7.0–26.0)
CALCIUM: 10.5 mg/dL — AB (ref 8.4–10.4)
CO2: 25 mEq/L (ref 22–29)
Chloride: 106 mEq/L (ref 98–109)
Creatinine: 1 mg/dL (ref 0.6–1.1)
EGFR: 77 mL/min/{1.73_m2} — AB (ref >90)
Glucose: 102 mg/dl (ref 70–140)
Potassium: 4.1 mEq/L (ref 3.5–5.1)
Sodium: 141 mEq/L (ref 136–145)
TOTAL PROTEIN: 8.1 g/dL (ref 6.4–8.3)

## 2016-11-26 LAB — CBC WITH DIFFERENTIAL/PLATELET
BASO%: 0.5 % (ref 0.0–2.0)
BASOS ABS: 0 10*3/uL (ref 0.0–0.1)
EOS ABS: 0 10*3/uL (ref 0.0–0.5)
EOS%: 0.8 % (ref 0.0–7.0)
HCT: 34.3 % — ABNORMAL LOW (ref 34.8–46.6)
HEMOGLOBIN: 11.5 g/dL — AB (ref 11.6–15.9)
LYMPH%: 33 % (ref 14.0–49.7)
MCH: 31.3 pg (ref 25.1–34.0)
MCHC: 33.5 g/dL (ref 31.5–36.0)
MCV: 93.2 fL (ref 79.5–101.0)
MONO#: 0.4 10*3/uL (ref 0.1–0.9)
MONO%: 10.6 % (ref 0.0–14.0)
NEUT%: 55.1 % (ref 38.4–76.8)
NEUTROS ABS: 2.1 10*3/uL (ref 1.5–6.5)
Platelets: 259 10*3/uL (ref 145–400)
RBC: 3.68 10*6/uL — ABNORMAL LOW (ref 3.70–5.45)
RDW: 15.9 % — ABNORMAL HIGH (ref 11.2–14.5)
WBC: 3.9 10*3/uL (ref 3.9–10.3)
lymph#: 1.3 10*3/uL (ref 0.9–3.3)

## 2016-11-26 MED ORDER — IOPAMIDOL (ISOVUE-300) INJECTION 61%
INTRAVENOUS | Status: AC
  Administered 2016-11-26: 100 mL via INTRAVENOUS
  Filled 2016-11-26: qty 75

## 2016-11-27 ENCOUNTER — Other Ambulatory Visit: Payer: Medicare Other

## 2016-11-30 ENCOUNTER — Telehealth: Payer: Self-pay | Admitting: Internal Medicine

## 2016-11-30 ENCOUNTER — Ambulatory Visit (HOSPITAL_BASED_OUTPATIENT_CLINIC_OR_DEPARTMENT_OTHER): Payer: Medicare Other | Admitting: Internal Medicine

## 2016-11-30 ENCOUNTER — Encounter: Payer: Self-pay | Admitting: Internal Medicine

## 2016-11-30 VITALS — BP 134/82 | HR 118 | Temp 98.8°F | Resp 20 | Wt 242.3 lb

## 2016-11-30 DIAGNOSIS — R000 Tachycardia, unspecified: Secondary | ICD-10-CM

## 2016-11-30 DIAGNOSIS — C342 Malignant neoplasm of middle lobe, bronchus or lung: Secondary | ICD-10-CM

## 2016-11-30 DIAGNOSIS — Z85118 Personal history of other malignant neoplasm of bronchus and lung: Secondary | ICD-10-CM | POA: Diagnosis not present

## 2016-11-30 DIAGNOSIS — C3481 Malignant neoplasm of overlapping sites of right bronchus and lung: Secondary | ICD-10-CM

## 2016-11-30 NOTE — Progress Notes (Signed)
[place] Telephone:(336) [phone]   Fax:(336) [phone]  OFFICE PROGRESS NOTE  No PCP Per Patient No address on file  DIAGNOSIS: Stage IIIA (T2b, N2, M0) non-small cell lung cancer, invasive poorly differentiated squamous cell carcinoma diagnosed in November 2016 and presented with large right hilar mass with collapse of the right middle lobe and extension into the right upper lobe and right lower lobe along the hilum with right paratracheal and subcarinal lymphadenopathy.   PRIOR THERAPY:  1) Concurrent chemoradiation with weekly carboplatin for AUC of 2 and paclitaxel 45 MG/M2. First dose 08/19/2015. Status post 5 cycles. Last cycle was given 10/07/2015 with significant response. 2) Consolidation chemotherapy with carboplatin for AUC of 5 and paclitaxel 175 MG/M2 every 3 weeks with Neulasta support. First dose 11/18/2015. Status post 3 cycles.  CURRENT THERAPY: Observation.  INTERVAL HISTORY: Theresa [name] 56 y.o. female came to the clinic today for routine 4 month follow-up visit. The patient is feeling fine today was no specific complaints. She is very active and exercises at regular basis. She denied having any chest pain, shortness of breath, cough or hemoptysis. She continues to have tachycardia that when she come to the cancer Center because of anxiety about her scans. She denied having any fever or chills. She has no nausea, vomiting, diarrhea or constipation. She had repeat CT scan of the chest performed recently and she is here for evaluation and discussion of her scan results.  MEDICAL HISTORY: Past Medical History:  Diagnosis Date  . Cancer ([place])    5 years ago - cervical cancer  . Encounter for antineoplastic chemotherapy 08/27/2015  . Hypokalemia 09/30/2015  . Non-small cell carcinoma of lung, stage 3 ([place]) 08/08/2015  . Paroxysmal atrial flutter ([place]) 09/16/2015  . Pneumonia     ALLERGIES:  has No Known Allergies.  MEDICATIONS:  No current  outpatient prescriptions on file.   No current facility-administered medications for this visit.     SURGICAL HISTORY:  Past Surgical History:  Procedure Laterality Date  . TUBAL LIGATION    . VIDEO BRONCHOSCOPY WITH ENDOBRONCHIAL ULTRASOUND N/A 08/02/2015   Procedure: VIDEO BRONCHOSCOPY WITH ENDOBRONCHIAL ULTRASOUND;  Surgeon: [name], MD;  Location: [facility];  Service: Thoracic;  Laterality: N/A;  . WISDOM TOOTH EXTRACTION      REVIEW OF SYSTEMS:  A comprehensive review of systems was negative.   PHYSICAL EXAMINATION: General appearance: alert, cooperative and no distress Head: Normocephalic, without obvious abnormality, atraumatic Neck: no adenopathy, no JVD, supple, symmetrical, trachea midline and thyroid not enlarged, symmetric, no tenderness/mass/nodules Lymph nodes: Cervical, supraclavicular, and axillary nodes normal. Resp: clear to auscultation bilaterally Back: symmetric, no curvature. ROM normal. No CVA tenderness. Cardio: regular rate and rhythm, S1, S2 normal, no murmur, click, rub or gallop and Tachycardic GI: soft, non-tender; bowel sounds normal; no masses,  no organomegaly Extremities: extremities normal, atraumatic, no cyanosis or edema  ECOG PERFORMANCE STATUS: 1 - Symptomatic but completely ambulatory  Blood pressure 134/82, pulse (!) 118, temperature 98.8 F (37.1 C), temperature source Oral, resp. rate 20, weight 242 lb 4.8 oz (109.9 kg), SpO2 100 %.  LABORATORY DATA: Lab Results  Component Value Date   WBC 3.9 11/26/2016   HGB 11.5 (L) 11/26/2016   HCT 34.3 (L) 11/26/2016   MCV 93.2 11/26/2016   PLT 259 11/26/2016      Chemistry      Component Value Date/Time   NA 141 11/26/2016 1231   K 4.1 11/26/2016 1231  CL 109 10/11/2015 0530   CO2 25 11/26/2016 1231   BUN 9.7 11/26/2016 1231   CREATININE 1.0 11/26/2016 1231      Component Value Date/Time   CALCIUM 10.5 (H) 11/26/2016 1231   ALKPHOS 96 11/26/2016 1231   AST 20 11/26/2016  1231   ALT 14 11/26/2016 1231   BILITOT 0.58 11/26/2016 1231       RADIOGRAPHIC STUDIES: Ct Chest W Contrast  Result Date: 11/26/2016 CLINICAL DATA:  Restaging non small cell lung cancer. EXAM: CT CHEST WITH CONTRAST TECHNIQUE: Multidetector CT imaging of the chest was performed during intravenous contrast administration. CONTRAST:  1 ISOVUE-300 IOPAMIDOL (ISOVUE-300) INJECTION 61% COMPARISON:  07/27/2016 FINDINGS: Chest wall: No breast masses, supraclavicular or axillary lymphadenopathy. The thyroid gland appears normal. Cardiovascular: The heart is normal in size. No pericardial effusion. The aorta is normal in caliber. No dissection. Aberrant right subclavian artery again noted. Mediastinum/Nodes: No mediastinal or hilar mass or lymphadenopathy. The esophagus is grossly normal. Lungs/Pleura: Stable extensive radiation changes and loss of volume involving the right hemithorax. No findings suspicious for recurrent tumor or metastatic pulmonary disease. There is a stable 5 mm right lower lobe pulmonary nodule on image number 90. No new pulmonary lesions. No acute overlying pulmonary process. No pleural effusion. Upper Abdomen: No significant upper abdominal findings. Stable low-attenuation liver lesions consistent with benign cysts. No adrenal gland lesions. Musculoskeletal: No significant bony findings. No lytic or sclerotic bone lesions to suggest metastasis. IMPRESSION: Stable CT appearance chest when compared to prior studies. Extensive radiation changes and loss of volume in the right hemithorax but no findings suspicious for recurrent tumor or metastatic disease. Stable 5 mm right lower lobe pulmonary nodule. Electronically Signed   By: Marijo Sanes M.D.   On: 11/26/2016 17:31    ASSESSMENT AND PLAN:  This is a very pleasant 56 years old African-American female with a stage IIIa non-small cell lung cancer, squamous cell carcinoma presented with large right hilar mass and mediastinal  lymphadenopathy diagnosed in November 2016. She status post concurrent chemoradiation followed by consolidation chemotherapy. The patient is currently on observation and the recent CT scan of the chest showed no clear evidence for disease progression. I discussed the scan results with the patient today. I recommended for her to continue on observation with repeat CT scan of the chest in 4 months. For the tachycardia, I recommended for the patient to see a cardiologist for evaluation of her condition. She was advised to call immediately if she has any concerning symptoms in the interval. The patient voices understanding of current disease status and treatment options and is in agreement with the current care plan.  All questions were answered. The patient knows to call the clinic with any problems, questions or concerns. We can certainly see the patient much sooner if necessary. I spent 10 minutes counseling the patient face to face. The total time spent in the appointment was 15 minutes.  Disclaimer: This note was dictated with voice recognition software. Similar sounding words can inadvertently be transcribed and may not be corrected upon review.

## 2016-11-30 NOTE — Telephone Encounter (Signed)
Appointments scheduled per 3/12 LOS. Patient given AVS report and calendars with future scheduled appointments.

## 2016-12-08 DIAGNOSIS — R109 Unspecified abdominal pain: Secondary | ICD-10-CM | POA: Diagnosis not present

## 2016-12-14 DIAGNOSIS — R350 Frequency of micturition: Secondary | ICD-10-CM | POA: Diagnosis not present

## 2016-12-14 DIAGNOSIS — R109 Unspecified abdominal pain: Secondary | ICD-10-CM | POA: Diagnosis not present

## 2016-12-14 IMAGING — CT CT ANGIO CHEST
2 of 9 series · 18 of 46 positions shown · IV contrast (APPLIED)
Comparison: Chest radiographs dated 07/06/2015

CLINICAL DATA: Shortness of breath, tightness, cough. Abnormal
chest radiograph with possible lung malignancy.

EXAM:
CT ANGIOGRAPHY CHEST WITH CONTRAST
TECHNIQUE: Multidetector CT imaging of the chest was performed using the
standard protocol during bolus administration of intravenous
contrast. Multiplanar CT image reconstructions and MIPs were
obtained to evaluate the vascular anatomy.
CONTRAST:  100mL OMNIPAQUE IOHEXOL 350 MG/ML SOLN

[Series 7: thins · axial · 0.63mm/px · z∈[+1093,+1340]mm · 15 of 279 slices shown]
[im 16/279  lung]
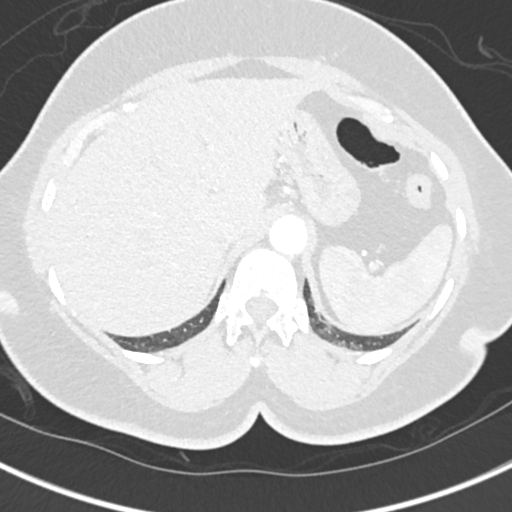
[im 31/279  soft-tissue]
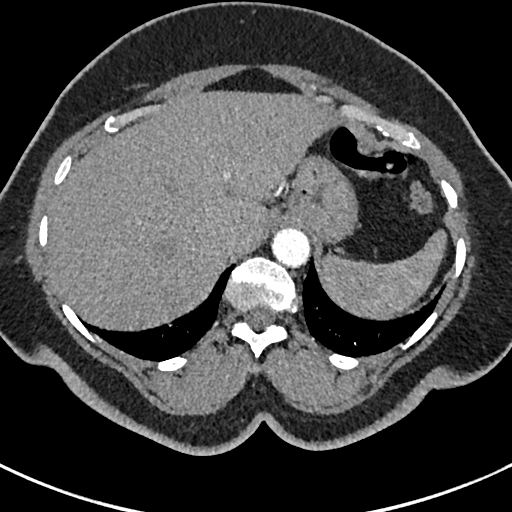
[im 47/279  lung]
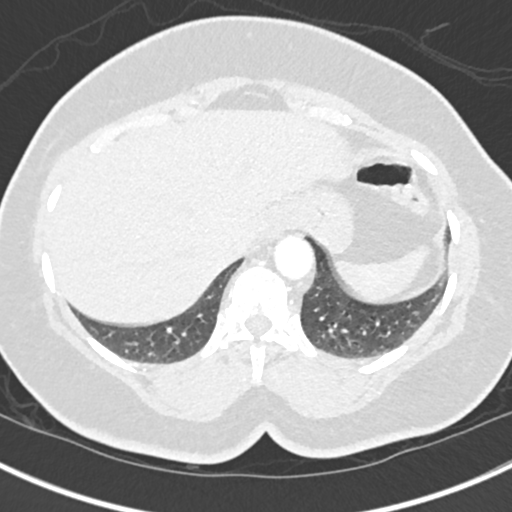
[im 62/279  soft-tissue]
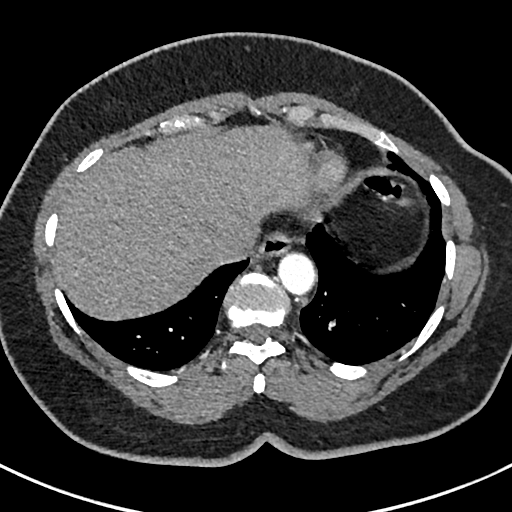
[im 93/279  lung]
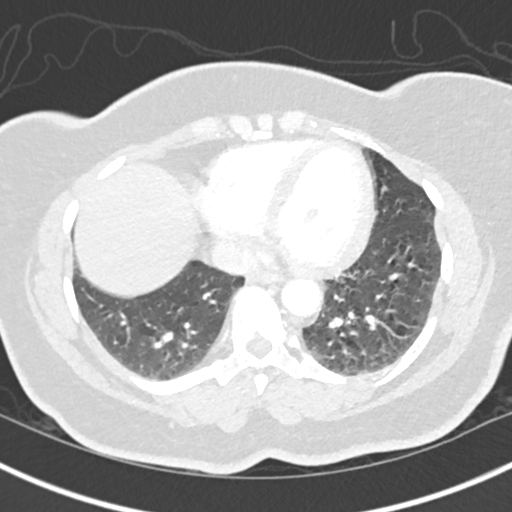
[im 109/279  soft-tissue]
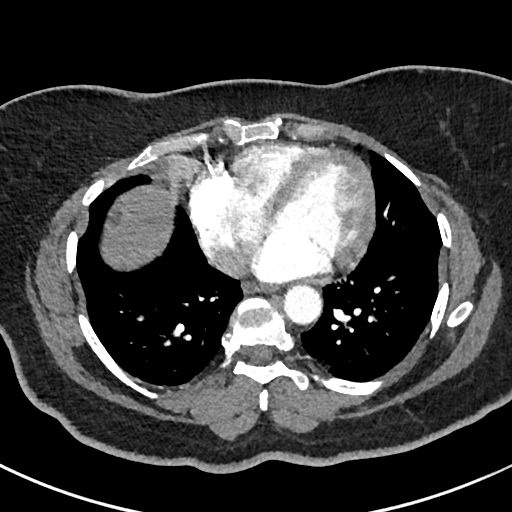
[im 124/279  lung]
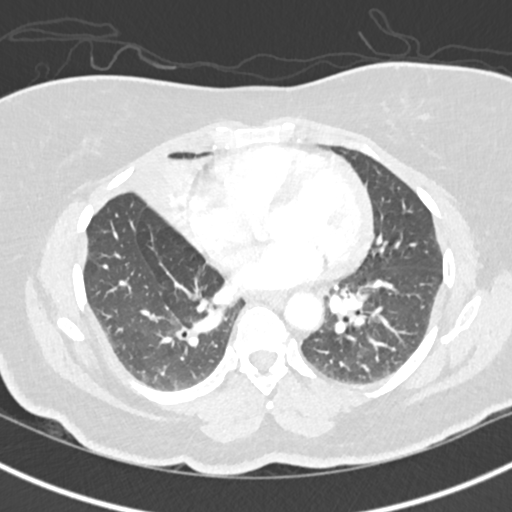
[im 140/279  soft-tissue]
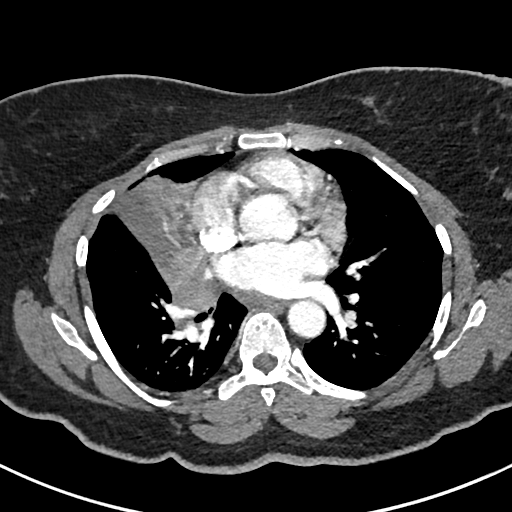
[im 155/279  lung]
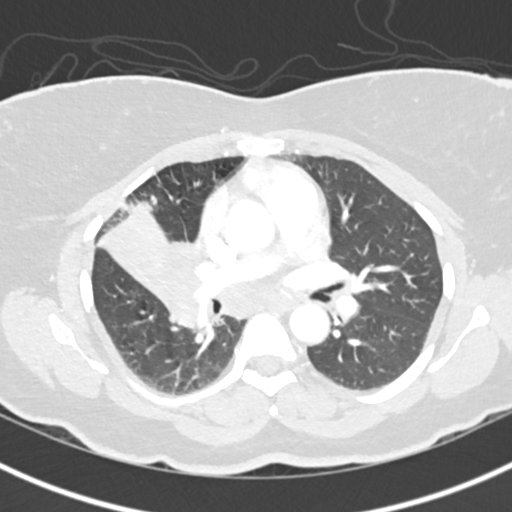
[im 170/279  soft-tissue]
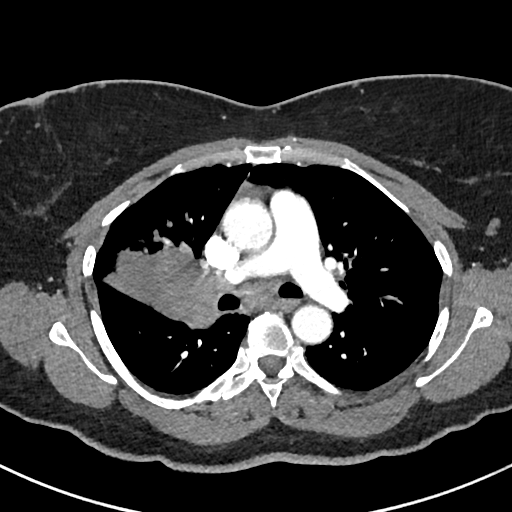
[im 186/279  lung]
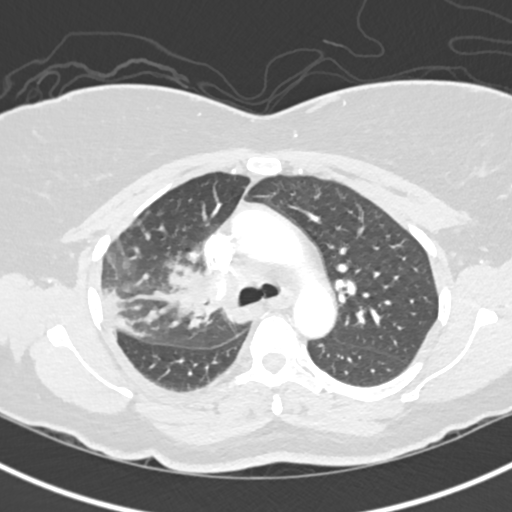
[im 217/279  soft-tissue]
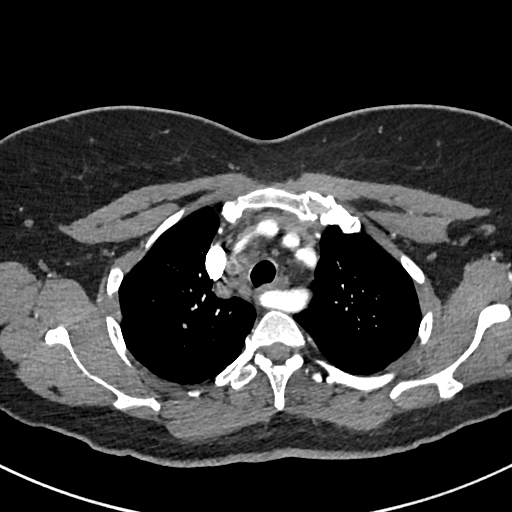
[im 232/279  lung]
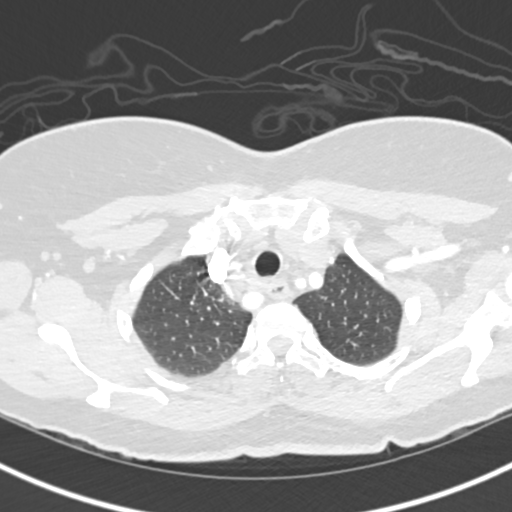
[im 248/279  soft-tissue]
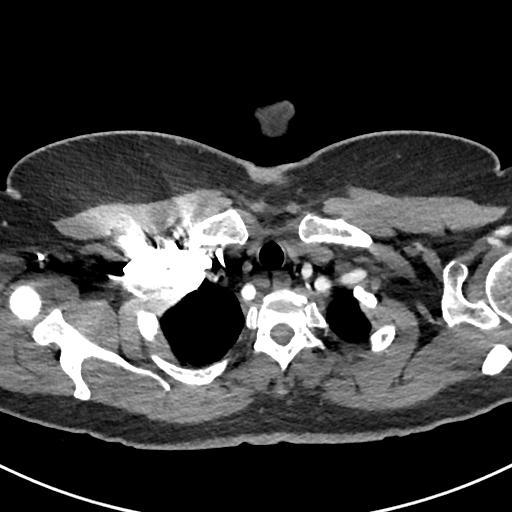
[im 263/279  lung]
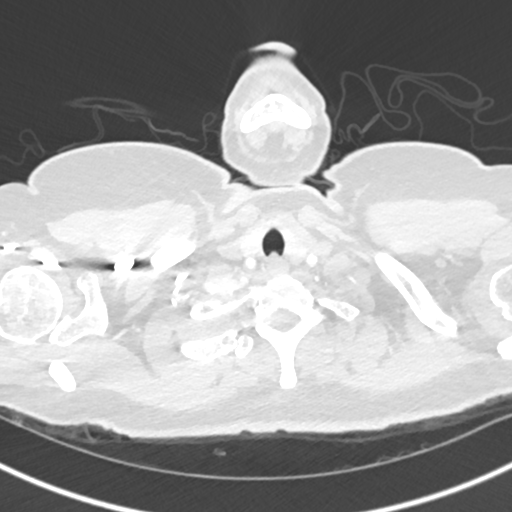

[Series 9: coronal mpr · coronal · 0.59mm/px · 3 of 116 slices shown]
[im 29/116  soft-tissue]
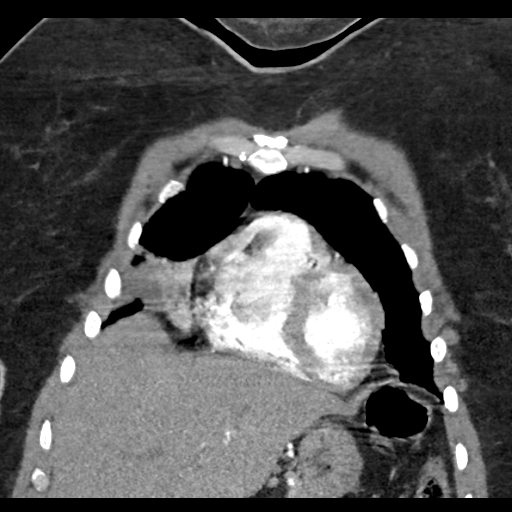
[im 58/116  soft-tissue]
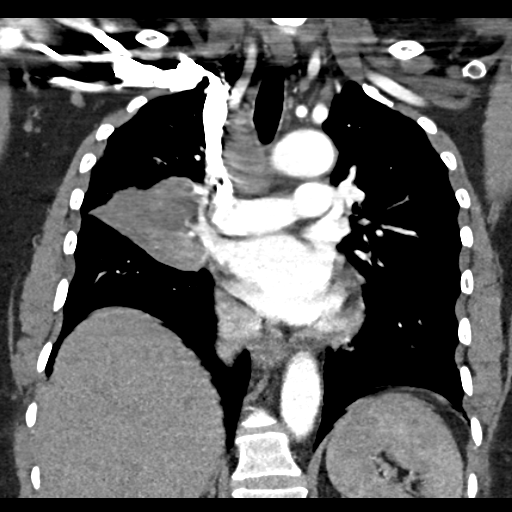
[im 87/116  soft-tissue]
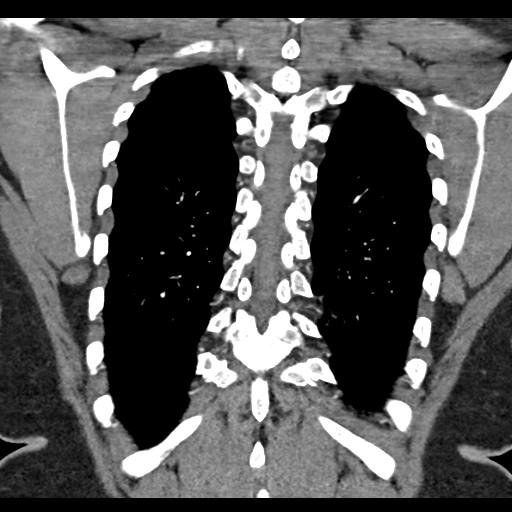

[18 of 46 positions shown; findings below may reference images not displayed]

FINDINGS: No evidence of pulmonary embolism. Branches of the right middle lobe
pulmonary artery are diminutive/attenuated but patent.

Mediastinum/Nodes: Heart is normal in size. No pericardial effusion.

Thoracic lymphadenopathy, including:

--11 mm short axis node posterior to the trachea at the thoracic
inlet (series 6/ image 14)

--14 mm short axis high right paratracheal node (series 6/ image 23)

--17 mm short axis low right paratracheal node (series 6/ image 29)

--23 mm short axis subcarinal node (series 6/ image 42)

--18 mm short axis interlobar right lower lobe node (series 6/ image
48), less likely a pulmonary nodule

Visualized thyroid is unremarkable.

Lungs/Pleura: 6.1 x 4.1 cm mass in the posterior right upper lobe
(Series 6/ image 39), with cut off of the right upper lobe bronchus
(series 6/ image 35), and cut off/effacement of the right middle
lobe bronchus (series 6/ image 45). Mass extends to the right hilar
region (series 6/image 45).

Secondary postobstructive opacity in the posterior right upper lobe
with secondary right middle lobe collapse.

Additional patchy opacities in the peripheral right upper lobe
(series 8/image [DATE] also reflect postobstructive opacity versus
lymphangitic spread.

Nodular central right lower lobe opacities (series 8/ image 45) are
favored to interlobar right lower lobe lymph nodes, less likely
pulmonary nodules.

Left lung is clear.

No pleural effusion or pneumothorax.

Upper abdomen: Visualized upper abdomen is unremarkable.

Musculoskeletal: Mild degenerative changes of the thoracic spine.

Review of the MIP images confirms the above findings.
IMPRESSION: No evidence of pulmonary embolism.

6.1 x 4.1 cm posterior right upper lobe mass extending to the right
middle lobe and right hilar region. Associated postobstructive
opacity in the right upper and middle lobes.

Thoracic lymphadenopathy, measuring up to 2.3 cm short axis, as
above.

## 2016-12-21 ENCOUNTER — Encounter (HOSPITAL_COMMUNITY): Payer: Self-pay | Admitting: Emergency Medicine

## 2016-12-21 DIAGNOSIS — R1011 Right upper quadrant pain: Secondary | ICD-10-CM | POA: Diagnosis not present

## 2016-12-21 DIAGNOSIS — Z87891 Personal history of nicotine dependence: Secondary | ICD-10-CM | POA: Insufficient documentation

## 2016-12-21 DIAGNOSIS — Z8541 Personal history of malignant neoplasm of cervix uteri: Secondary | ICD-10-CM | POA: Insufficient documentation

## 2016-12-21 DIAGNOSIS — R109 Unspecified abdominal pain: Secondary | ICD-10-CM | POA: Diagnosis not present

## 2016-12-21 DIAGNOSIS — R110 Nausea: Secondary | ICD-10-CM | POA: Diagnosis not present

## 2016-12-21 LAB — URINALYSIS, ROUTINE W REFLEX MICROSCOPIC
Bilirubin Urine: NEGATIVE
GLUCOSE, UA: NEGATIVE mg/dL
Hgb urine dipstick: NEGATIVE
KETONES UR: NEGATIVE mg/dL
Nitrite: NEGATIVE
PH: 6 (ref 5.0–8.0)
Protein, ur: NEGATIVE mg/dL
SPECIFIC GRAVITY, URINE: 1.01 (ref 1.005–1.030)

## 2016-12-21 LAB — COMPREHENSIVE METABOLIC PANEL
ALK PHOS: 83 U/L (ref 38–126)
ALT: 15 U/L (ref 14–54)
AST: 25 U/L (ref 15–41)
Albumin: 4.1 g/dL (ref 3.5–5.0)
Anion gap: 10 (ref 5–15)
BILIRUBIN TOTAL: 0.9 mg/dL (ref 0.3–1.2)
BUN: 8 mg/dL (ref 6–20)
CALCIUM: 9.8 mg/dL (ref 8.9–10.3)
CO2: 27 mmol/L (ref 22–32)
CREATININE: 0.93 mg/dL (ref 0.44–1.00)
Chloride: 102 mmol/L (ref 101–111)
Glucose, Bld: 117 mg/dL — ABNORMAL HIGH (ref 65–99)
Potassium: 3.1 mmol/L — ABNORMAL LOW (ref 3.5–5.1)
Sodium: 139 mmol/L (ref 135–145)
Total Protein: 8.3 g/dL — ABNORMAL HIGH (ref 6.5–8.1)

## 2016-12-21 LAB — CBC
HCT: 31.5 % — ABNORMAL LOW (ref 36.0–46.0)
Hemoglobin: 10.7 g/dL — ABNORMAL LOW (ref 12.0–15.0)
MCH: 31.2 pg (ref 26.0–34.0)
MCHC: 34 g/dL (ref 30.0–36.0)
MCV: 91.8 fL (ref 78.0–100.0)
PLATELETS: 291 10*3/uL (ref 150–400)
RBC: 3.43 MIL/uL — AB (ref 3.87–5.11)
RDW: 15.6 % — ABNORMAL HIGH (ref 11.5–15.5)
WBC: 5.1 10*3/uL (ref 4.0–10.5)

## 2016-12-21 LAB — LIPASE, BLOOD

## 2016-12-21 NOTE — ED Triage Notes (Signed)
Pt to ED c/o RUQ abdominal pain x 2 weeks accompanied by nausea. Pt states she has been to 2 different urgent cares and sent home with tramadol for pain, no relief. Pt states the pain is worse 10-15 minutes after eating. Pt denies vomiting/diarrhea/fevers.

## 2016-12-22 ENCOUNTER — Emergency Department (HOSPITAL_COMMUNITY): Payer: Medicare Other

## 2016-12-22 ENCOUNTER — Emergency Department (HOSPITAL_COMMUNITY)
Admission: EM | Admit: 2016-12-22 | Discharge: 2016-12-22 | Disposition: A | Discharge status: Home / Self Care | Payer: Medicare Other | Attending: Emergency Medicine | Admitting: Emergency Medicine

## 2016-12-22 DIAGNOSIS — R1011 Right upper quadrant pain: Secondary | ICD-10-CM

## 2016-12-22 DIAGNOSIS — R110 Nausea: Secondary | ICD-10-CM | POA: Diagnosis not present

## 2016-12-22 DIAGNOSIS — R109 Unspecified abdominal pain: Secondary | ICD-10-CM | POA: Diagnosis not present

## 2016-12-22 MED ORDER — ONDANSETRON HCL 4 MG/2ML IJ SOLN
4.0000 mg | Freq: Once | INTRAMUSCULAR | Status: AC
  Administered 2016-12-22: 4 mg via INTRAVENOUS
  Filled 2016-12-22: qty 2

## 2016-12-22 MED ORDER — MORPHINE SULFATE (PF) 4 MG/ML IV SOLN
6.0000 mg | Freq: Once | INTRAVENOUS | Status: AC
  Administered 2016-12-22: 6 mg via INTRAVENOUS
  Filled 2016-12-22: qty 2

## 2016-12-22 MED ORDER — ONDANSETRON 8 MG PO TBDP: 8.0000 mg | ORAL_TABLET | Freq: Three times a day (TID) | ORAL | 0 refills | Status: DC | PRN

## 2016-12-22 MED ORDER — DICYCLOMINE HCL 20 MG PO TABS: 20.0000 mg | ORAL_TABLET | Freq: Two times a day (BID) | ORAL | 0 refills | Status: DC

## 2016-12-22 MED ORDER — SODIUM CHLORIDE 0.9 % IV BOLUS (SEPSIS)
1000.0000 mL | Freq: Once | INTRAVENOUS | Status: AC
  Administered 2016-12-22: 1000 mL via INTRAVENOUS

## 2016-12-22 MED ORDER — IOPAMIDOL (ISOVUE-300) INJECTION 61%
INTRAVENOUS | Status: AC
  Administered 2016-12-22: 100 mL via INTRAVENOUS
  Filled 2016-12-22: qty 100

## 2016-12-22 MED ORDER — MORPHINE SULFATE (PF) 4 MG/ML IV SOLN: 4.0000 mg | Freq: Once | INTRAVENOUS | Status: DC

## 2016-12-22 NOTE — Discharge Instructions (Signed)
Take bentyl as prescribed as needed for intestinal spasms/ pain. Zofran as needed for nausea and vomiting. Your CT showed liver cysts and possible ovarian cyst/mass. Follow up with family doctor for further evaluation

## 2016-12-22 NOTE — ED Notes (Signed)
Unsuccessful IV attempt in right AC x1.

## 2016-12-22 NOTE — ED Notes (Signed)
Pt verbalized understanding of d/c instructions and has no further questions. Pt is stable, A&Ox4, VSS.  

## 2016-12-22 NOTE — ED Notes (Signed)
Patient transported to Ultrasound 

## 2016-12-22 NOTE — ED Notes (Signed)
Patient transported to CT 

## 2016-12-22 NOTE — ED Provider Notes (Signed)
[facility] DEPT Provider Note   CSN: [id number] Arrival date & time: 12/21/16  1922     History   Chief Complaint Chief Complaint  Patient presents with  . Abdominal Pain    HPI Theresa [name] is a 56 y.o. female.  HPI Theresa [name] is a 56 y.o. female with history of non-small cell carcinoma of the lung, history of endometrial cancer, paroxysmal A flutter, presents to emergency department with abdominal pain. Patient states symptoms started 2 weeks ago, but worsened last night. Patient states that she has been to an urgent care twice, and initially was told that she may have shingles, however she states she never developed a rash. She went back and had a urinalysis performed which showed no findings. Patient states the pain is now worse. She is tearful. She is taking tramadol with no relief. Denies urinary symptoms. Denies fever or chills. Admits to nausea no vomiting. Normal bowel movements.  Past Medical History:  Diagnosis Date  . Cancer ([place])    5 years ago - cervical cancer  . Encounter for antineoplastic chemotherapy 08/27/2015  . Hypokalemia 09/30/2015  . Non-small cell carcinoma of lung, stage 3 ([place]) 08/08/2015  . Paroxysmal atrial flutter ([place]) 09/16/2015  . Pneumonia     Patient Active Problem List   Diagnosis Date Noted  . Dehydration 10/11/2015  . Nausea with vomiting 10/11/2015  . Chemotherapy induced neutropenia ([place]) 09/30/2015  . Hypokalemia 09/30/2015  . 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 Antineoplastic chemotherapy induced pancytopenia ([place]) 09/18/2015  . Constipation 09/18/2015  . Intractable nausea and vomiting 09/18/2015  . Typical atrial flutter ([place])   . Malignant neoplasm of overlapping sites of right lung ([place])   . Malnutrition of moderate degree 09/17/2015  . Chest pain 09/16/2015  . Tachycardia 09/16/2015  . Pain in the chest   . Encounter for antineoplastic chemotherapy 08/27/2015  . Non-small cell carcinoma of lung, stage  3 ([place]) 08/08/2015  . Lung mass 07/16/2015  . Malignant neoplasm of cervix ([employer]) 10/08/2011    Past Surgical History:  Procedure Laterality Date  . TUBAL LIGATION    . VIDEO BRONCHOSCOPY WITH ENDOBRONCHIAL ULTRASOUND N/A 08/02/2015   Procedure: VIDEO BRONCHOSCOPY WITH ENDOBRONCHIAL ULTRASOUND;  Surgeon: [name], MD;  Location: [facility];  Service: Thoracic;  Laterality: N/A;  . WISDOM TOOTH EXTRACTION      OB History    No data available       Home Medications    Prior to Admission medications   Medication Sig Start Date End Date Taking? Authorizing Provider  ibuprofen (ADVIL,MOTRIN) 200 MG tablet Take 400 mg by mouth every 6 (six) hours as needed for moderate pain.   Yes Historical Provider, MD    Family History Family History  Problem Relation Age of Onset  . COPD Mother   . Diabetes type II Mother   . Heart disease Mother   . High Cholesterol Mother   . Lung cancer Father     Social History Social History  Substance Use Topics  . Smoking status: Former Smoker    Packs/day: 0.30    Years: 5.00    Types: Cigarettes    Quit date: 07/31/1990  . Smokeless tobacco: Never Used  . Alcohol use No     Allergies   Patient has no known allergies.   Review of Systems Review of Systems  Constitutional: Negative for chills and fever.  Respiratory: Negative for cough, chest tightness and shortness of breath.   Cardiovascular:  Negative for chest pain, palpitations and leg swelling.  Gastrointestinal: Positive for abdominal pain and nausea. Negative for diarrhea and vomiting.  Genitourinary: Negative for dysuria, flank pain and pelvic pain.  Musculoskeletal: Negative for arthralgias, myalgias, neck pain and neck stiffness.  Skin: Negative for rash.  Neurological: Negative for dizziness, weakness and headaches.  All other systems reviewed and are negative.    Physical Exam Updated Vital Signs BP 120/72   Pulse 91   Temp 99.1 F (37.3 C) (Oral)   Resp  20   Ht 5' 7.5" (1.715 m)   Wt 109.3 kg   SpO2 91%   BMI 37.19 kg/m   Physical Exam  Constitutional: She appears well-developed and well-nourished. No distress.  HENT:  Head: Normocephalic.  Eyes: Conjunctivae are normal.  Neck: Neck supple.  Cardiovascular: Normal rate, regular rhythm and normal heart sounds.   Pulmonary/Chest: Effort normal and breath sounds normal. No respiratory distress. She has no wheezes. She has no rales.  Abdominal: Soft. Bowel sounds are normal. She exhibits no distension. There is tenderness. There is no rebound.  Right mid abdominal and Right UQ tenderness  Musculoskeletal: She exhibits no edema.  Neurological: She is alert.  Skin: Skin is warm and dry.  Psychiatric: She has a normal mood and affect. Her behavior is normal.  Nursing note and vitals reviewed.    ED Treatments / Results  Labs (all labs ordered are listed, but only abnormal results are displayed) Labs Reviewed  LIPASE, BLOOD - Abnormal; Notable for the following:       Result Value   Lipase <10 (*)    All other components within normal limits  COMPREHENSIVE METABOLIC PANEL - Abnormal; Notable for the following:    Potassium 3.1 (*)    Glucose, Bld 117 (*)    Total Protein 8.3 (*)    All other components within normal limits  CBC - Abnormal; Notable for the following:    RBC 3.43 (*)    Hemoglobin 10.7 (*)    HCT 31.5 (*)    RDW 15.6 (*)    All other components within normal limits  URINALYSIS, ROUTINE W REFLEX MICROSCOPIC - Abnormal; Notable for the following:    Leukocytes, UA TRACE (*)    Bacteria, UA RARE (*)    Squamous Epithelial / LPF 0-5 (*)    All other components within normal limits    EKG  EKG Interpretation None       Radiology US Abdomen Limited Ruq  Result Date: 12/22/2016 CLINICAL DATA:  Right upper quadrant pain and nausea for 2 weeks EXAM: US ABDOMEN LIMITED - RIGHT UPPER QUADRANT COMPARISON:  None. FINDINGS: Gallbladder: No gallstones or wall  thickening visualized. No sonographic Murphy sign noted by sonographer. Common bile duct: Diameter: 3 mm Liver: No focal lesion identified. Within normal limits in parenchymal echogenicity. IMPRESSION: Normal liver, gallbladder and bile ducts. Electronically Signed   By: Andreas Newport M.D.   On: 12/22/2016 04:30    Procedures Procedures (including critical care time)  Medications Ordered in ED Medications  sodium chloride 0.9 % bolus 1,000 mL (not administered)  ondansetron (ZOFRAN) injection 4 mg (4 mg Intravenous Given 12/22/16 0252)  morphine 4 MG/ML injection 6 mg (6 mg Intravenous Given 12/22/16 0252)     Initial Impression / Assessment and Plan / ED Course  I have reviewed the triage vital signs and the nursing notes.  Pertinent labs & imaging results that were available during my care of the patient were reviewed  by me and considered in my medical decision making (see chart for details).     Patient in emergency department with severe right-sided abdominal pain. Patient is tearful. Pain for several weeks, worsened since yesterday. We will give pain medications, labs obtained by triage are unremarkable, normal WBC. Will get ultrasound given right upper quadrant tenderness to rule out cholelithiasis/cholecystitis.   4:43 AM Patient's ultrasound is normal. She is now slightly tachycardic with heart rate of 110s. She continues to complain of severe abdominal pain. We will get CT for further evaluation.  6:42 AM CT abdomen and pelvis showing unchanged liver cysts and possible ovarian dermoid. Otherwise unremarkable CT. Patient states she is feeling slightly better now. Will start her on Bentyl, Zofran. Question musculoskeletal pain versus intestinal spasms. Will have her follow-up with family doctor. Discussed results and plan, patient voiced understanding.  Vitals:   12/22/16 0315 12/22/16 0330 12/22/16 0345 12/22/16 0515  BP: 125/82 120/79 120/72 133/79  Pulse: 88 88 91 96    Resp:      Temp:      TempSrc:      SpO2: 95% 96% 91% 96%  Weight:      Height:          Final Clinical Impressions(s) / ED Diagnoses   Final diagnoses:  Right upper quadrant abdominal pain    New Prescriptions New Prescriptions   DICYCLOMINE (BENTYL) 20 MG TABLET    Take 1 tablet (20 mg total) by mouth 2 (two) times daily.   ONDANSETRON (ZOFRAN ODT) 8 MG DISINTEGRATING TABLET    Take 1 tablet (8 mg total) by mouth every 8 (eight) hours as needed for nausea or vomiting.     Jeannett Senior, PA-C 12/22/16 [place], DO 12/22/16 9024

## 2016-12-24 ENCOUNTER — Emergency Department (HOSPITAL_COMMUNITY)
Admission: EM | Admit: 2016-12-24 | Discharge: 2016-12-24 | Disposition: A | Discharge status: Home / Self Care | Payer: Medicare Other | Attending: Emergency Medicine | Admitting: Emergency Medicine

## 2016-12-24 ENCOUNTER — Encounter (HOSPITAL_COMMUNITY): Payer: Self-pay | Admitting: Emergency Medicine

## 2016-12-24 DIAGNOSIS — K5900 Constipation, unspecified: Secondary | ICD-10-CM | POA: Diagnosis not present

## 2016-12-24 DIAGNOSIS — R1011 Right upper quadrant pain: Secondary | ICD-10-CM

## 2016-12-24 DIAGNOSIS — Z85118 Personal history of other malignant neoplasm of bronchus and lung: Secondary | ICD-10-CM | POA: Insufficient documentation

## 2016-12-24 DIAGNOSIS — Z87891 Personal history of nicotine dependence: Secondary | ICD-10-CM | POA: Diagnosis not present

## 2016-12-24 LAB — COMPREHENSIVE METABOLIC PANEL
ALBUMIN: 3.8 g/dL (ref 3.5–5.0)
ALT: 12 U/L — ABNORMAL LOW (ref 14–54)
ANION GAP: 10 (ref 5–15)
AST: 28 U/L (ref 15–41)
Alkaline Phosphatase: 80 U/L (ref 38–126)
BILIRUBIN TOTAL: 0.7 mg/dL (ref 0.3–1.2)
BUN: 9 mg/dL (ref 6–20)
CO2: 23 mmol/L (ref 22–32)
Calcium: 9.6 mg/dL (ref 8.9–10.3)
Chloride: 103 mmol/L (ref 101–111)
Creatinine, Ser: 0.97 mg/dL (ref 0.44–1.00)
GFR calc Af Amer: >60 mL/min (ref >60)
GFR calc non Af Amer: >60 mL/min (ref >60)
GLUCOSE: 123 mg/dL — AB (ref 65–99)
POTASSIUM: 3.7 mmol/L (ref 3.5–5.1)
Sodium: 136 mmol/L (ref 135–145)
TOTAL PROTEIN: 7.9 g/dL (ref 6.5–8.1)

## 2016-12-24 LAB — CBC WITH DIFFERENTIAL/PLATELET
BASOS PCT: 0 %
Basophils Absolute: 0 10*3/uL (ref 0.0–0.1)
Eosinophils Absolute: 0 10*3/uL (ref 0.0–0.7)
Eosinophils Relative: 0 %
HEMATOCRIT: 33.7 % — AB (ref 36.0–46.0)
Hemoglobin: 11.4 g/dL — ABNORMAL LOW (ref 12.0–15.0)
Lymphocytes Relative: 25 %
Lymphs Abs: 1.5 10*3/uL (ref 0.7–4.0)
MCH: 31.1 pg (ref 26.0–34.0)
MCHC: 33.8 g/dL (ref 30.0–36.0)
MCV: 91.8 fL (ref 78.0–100.0)
MONOS PCT: 9 %
Monocytes Absolute: 0.5 10*3/uL (ref 0.1–1.0)
NEUTROS ABS: 4 10*3/uL (ref 1.7–7.7)
NEUTROS PCT: 66 %
Platelets: 297 10*3/uL (ref 150–400)
RBC: 3.67 MIL/uL — ABNORMAL LOW (ref 3.87–5.11)
RDW: 14.9 % (ref 11.5–15.5)
WBC: 6 10*3/uL (ref 4.0–10.5)

## 2016-12-24 LAB — LIPASE, BLOOD: Lipase: 11 U/L (ref 11–51)

## 2016-12-24 MED ORDER — CYCLOBENZAPRINE HCL 10 MG PO TABS
5.0000 mg | ORAL_TABLET | Freq: Once | ORAL | Status: AC
  Administered 2016-12-24: 5 mg via ORAL
  Filled 2016-12-24: qty 1

## 2016-12-24 NOTE — Discharge Instructions (Signed)
Increase fluid intake. Take simethicone and miralax for constipation and gas pain

## 2016-12-24 NOTE — ED Triage Notes (Signed)
Pt returns to ED with continued RUQ pain.  St's she was seen here 2 days ago for same.  St's pain is not getting any better.  Last BM 2 days ago

## 2016-12-24 NOTE — ED Notes (Signed)
Mesner MD at bedside.  

## 2016-12-24 NOTE — ED Provider Notes (Signed)
[facility] DEPT Provider Note   CSN: [id number] Arrival date & time: 12/24/16  1540     History   Chief Complaint Chief Complaint  Patient presents with  . Abdominal Pain    HPI Theresa [name] is a 56 y.o. female PMH non-small cell carcinoma lung, paroxysmal atrial flutter presents for three weeks intermittent right flank/RUQ burning pain. Pain relieved by applying external pressure. Not exertional or pleuritic. Pt evaluated at Urgent Care and advised likely shingles w/o skin changes, reports UA there unremarkable. Pt was evaluated in this ED on 4/2, abdominal CT and ultrasound negative. CMP, CBC, UA and lipase unremarkable at that time. Pt was prescribed Bentyl and tramadol which have not relieved the pain. Denies N/V/D, urinary symptoms, fever, chills. Last BM two days ago.   HPI  Past Medical History:  Diagnosis Date  . Cancer ([place])    5 years ago - cervical cancer  . Encounter for antineoplastic chemotherapy 08/27/2015  . Hypokalemia 09/30/2015  . Non-small cell carcinoma of lung, stage 3 ([place]) 08/08/2015  . Paroxysmal atrial flutter ([place]) 09/16/2015  . Pneumonia     Patient Active Problem List   Diagnosis Date Noted  . Dehydration 10/11/2015  . Nausea with vomiting 10/11/2015  . Chemotherapy induced neutropenia ([place]) 09/30/2015  . Hypokalemia 09/30/2015  . 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 Antineoplastic chemotherapy induced pancytopenia ([place]) 09/18/2015  . Constipation 09/18/2015  . Intractable nausea and vomiting 09/18/2015  . Typical atrial flutter ([place])   . Malignant neoplasm of overlapping sites of right lung ([place])   . Malnutrition of moderate degree 09/17/2015  . Chest pain 09/16/2015  . Tachycardia 09/16/2015  . Pain in the chest   . Encounter for antineoplastic chemotherapy 08/27/2015  . Non-small cell carcinoma of lung, stage 3 ([place]) 08/08/2015  . Lung mass 07/16/2015  . Malignant neoplasm of cervix ([place]) 10/08/2011    Past Surgical  History:  Procedure Laterality Date  . TUBAL LIGATION    . VIDEO BRONCHOSCOPY WITH ENDOBRONCHIAL ULTRASOUND N/A 08/02/2015   Procedure: VIDEO BRONCHOSCOPY WITH ENDOBRONCHIAL ULTRASOUND;  Surgeon: [name], MD;  Location: [facility];  Service: Thoracic;  Laterality: N/A;  . WISDOM TOOTH EXTRACTION      OB History    No data available       Home Medications    Prior to Admission medications   Medication Sig Start Date End Date Taking? Authorizing Provider  dicyclomine (BENTYL) 20 MG tablet Take 1 tablet (20 mg total) by mouth 2 (two) times daily. 12/22/16   Tatyana Kirichenko, PA-C  ibuprofen (ADVIL,MOTRIN) 200 MG tablet Take 400 mg by mouth every 6 (six) hours as needed for moderate pain.    Historical Provider, MD  ondansetron (ZOFRAN ODT) 8 MG disintegrating tablet Take 1 tablet (8 mg total) by mouth every 8 (eight) hours as needed for nausea or vomiting. 12/22/16   Jeannett Senior, PA-C    Family History Family History  Problem Relation Age of Onset  . COPD Mother   . Diabetes type II Mother   . Heart disease Mother   . High Cholesterol Mother   . Lung cancer Father     Social History Social History  Substance Use Topics  . Smoking status: Former Smoker    Packs/day: 0.30    Years: 5.00    Types: Cigarettes    Quit date: 07/31/1990  . Smokeless tobacco: Never Used  . Alcohol use No     Allergies   Patient has no  known allergies.   Review of Systems Review of Systems  Constitutional: Negative for chills and fever.  Respiratory: Negative for cough and shortness of breath.   Cardiovascular: Negative for chest pain and palpitations.  Gastrointestinal: Positive for abdominal pain. Negative for diarrhea, nausea and vomiting.  Genitourinary: Positive for flank pain. Negative for difficulty urinating, dysuria, frequency, hematuria and urgency.  Musculoskeletal: Negative for back pain and myalgias.  Skin: Negative for rash.     Physical Exam Updated Vital  Signs BP (!) 152/81   Pulse 99   Temp 98 F (36.7 C) (Oral)   Resp 16   Ht '5\' 8"'$  (1.727 m)   Wt 109.3 kg   SpO2 100%   BMI 36.64 kg/m   Physical Exam  Constitutional: She is oriented to person, place, and time. She appears well-developed and well-nourished. No distress.  HENT:  Head: Normocephalic and atraumatic.  Eyes: Conjunctivae and EOM are normal.  Neck: Normal range of motion. Neck supple.  Cardiovascular: Normal rate and regular rhythm.   No murmur heard. Pulmonary/Chest: Effort normal and breath sounds normal. No respiratory distress.  Abdominal: Soft. There is no tenderness.  Musculoskeletal: She exhibits no edema.       Thoracic back: She exhibits no bony tenderness and no swelling.       Back:  Neurological: She is alert and oriented to person, place, and time.  Skin: Skin is warm and dry. Capillary refill takes less than 2 seconds. No rash noted. She is not diaphoretic.  Psychiatric: She has a normal mood and affect.  Nursing note and vitals reviewed.    ED Treatments / Results  Labs (all labs ordered are listed, but only abnormal results are displayed) Labs Reviewed  COMPREHENSIVE METABOLIC PANEL - Abnormal; Notable for the following:       Result Value   Glucose, Bld 123 (*)    ALT 12 (*)    All other components within normal limits  CBC WITH DIFFERENTIAL/PLATELET - Abnormal; Notable for the following:    RBC 3.67 (*)    Hemoglobin 11.4 (*)    HCT 33.7 (*)    All other components within normal limits  LIPASE, BLOOD  CBC WITH DIFFERENTIAL/PLATELET    EKG  EKG Interpretation None       Radiology No results found.  Procedures Procedures (including critical care time)  Medications Ordered in ED Medications  cyclobenzaprine (FLEXERIL) tablet 5 mg (5 mg Oral Given 12/24/16 1846)     Initial Impression / Assessment and Plan / ED Course  I have reviewed the triage vital signs and the nursing notes.  Pertinent labs & imaging results that  were available during my care of the patient were reviewed by me and considered in my medical decision making (see chart for details).    56 y.o. female presents for intermittent burning RUQ and right flank pain. AF, VSS, NAD, sating 100% on RA. Physical exam unremarkable. Reviewed imaging from two days ago, unremarkable abdominal ultrasound and CT abdomen. Pain c/w musculoskeletal or gas pain. Doubt PE, ACS, cholecystitis or other emergent abdominal pathology. Advise increase PO hydration, simethicone and stool softeners. Appropriate for out-patient follow-up.   Final Clinical Impressions(s) / ED Diagnoses   Final diagnoses:  Right upper quadrant abdominal pain  Constipation, unspecified constipation type    New Prescriptions Discharge Medication List as of 12/24/2016  7:45 PM       Monico Blitz, MD 12/24/16 2320    Merrily Pew, MD 12/25/16 1034

## 2016-12-26 ENCOUNTER — Ambulatory Visit (INDEPENDENT_AMBULATORY_CARE_PROVIDER_SITE_OTHER): Payer: Medicare Other | Admitting: Urgent Care

## 2016-12-26 VITALS — BP 131/84 | HR 111 | Temp 97.8°F | Resp 18 | Ht 67.5 in | Wt 237.5 lb

## 2016-12-26 DIAGNOSIS — R109 Unspecified abdominal pain: Secondary | ICD-10-CM | POA: Diagnosis not present

## 2016-12-26 DIAGNOSIS — Z8541 Personal history of malignant neoplasm of cervix uteri: Secondary | ICD-10-CM | POA: Diagnosis not present

## 2016-12-26 DIAGNOSIS — R1011 Right upper quadrant pain: Secondary | ICD-10-CM

## 2016-12-26 DIAGNOSIS — K5900 Constipation, unspecified: Secondary | ICD-10-CM | POA: Diagnosis not present

## 2016-12-26 DIAGNOSIS — Z85118 Personal history of other malignant neoplasm of bronchus and lung: Secondary | ICD-10-CM | POA: Diagnosis not present

## 2016-12-26 DIAGNOSIS — R1013 Epigastric pain: Secondary | ICD-10-CM | POA: Diagnosis not present

## 2016-12-26 MED ORDER — CYCLOBENZAPRINE HCL 5 MG PO TABS: 5.0000 mg | ORAL_TABLET | Freq: Three times a day (TID) | ORAL | 1 refills | Status: DC | PRN

## 2016-12-26 MED ORDER — NAPROXEN SODIUM 550 MG PO TABS: 550.0000 mg | ORAL_TABLET | Freq: Two times a day (BID) | ORAL | 1 refills | Status: DC

## 2016-12-26 MED ORDER — DOCUSATE SODIUM 50 MG PO CAPS: 50.0000 mg | ORAL_CAPSULE | Freq: Two times a day (BID) | ORAL | 0 refills | Status: DC

## 2016-12-26 MED ORDER — POLYETHYLENE GLYCOL 3350 17 GM/SCOOP PO POWD: 17.0000 g | Freq: Every day | ORAL | 1 refills | Status: DC | PRN

## 2016-12-26 NOTE — Patient Instructions (Addendum)
Please use Miralax for moderate to severe constipation. Take this once a day for the next 2-3 days. Please also start docusate stool softener, twice a day for at least 1 week. If stools become loose, cut down to once a day for another week. If stools remain loose, cut back to 1 pill every other day for a third week. You can stop docusate thereafter and resume as needed for constipation.  To help reduce constipation and promote bowel health: 1. Drink at least 64 ounces of water each day 2. Eat plenty of fiber (fruits, vegetables, whole grains, legumes) 3. Be physically active or exercise including walking, jogging, swimming, yoga, etc. 4. For active constipation use a stool softener (docusate) or an osmotic laxative (like Miralax) each day, or as needed.   Salads - Kale, Spinach, Cabbage, Spring mix Fruits - Avocadoes, berries (blueberries, raspberries, blackberries), apples, oranges, pomegranate Seeds - Quinoa, [place] seeds; you can also incorporate oatmeal Vegetables - aspargus, mashed cauliflower, broccoli, green beans, brussel spouts, bell peppers; stay away from starchy vegetables like potatoes, carrots, peas  Brussel sprouts - Cut off stems. Place in a mixing bowl that has a lid. Pour in a 1/4-1/2 cup olive oil, spices, use a light amount of parmesan. Place on a baking sheet. Bake for 10 minutes at 400F. Take it out, eat the brussel chips. Place for another 5-10 minutes.   Mashed cauliflower - Boil a bunch of cauliflower in a pot of water. Blend in a food processor with 1-2 tablespoons of butter.  Spaghetti squash -  Cut the squash in half very carefully, clean out seeds from the middle. Place 1/2 face down in a microwave safe dish with at least 2 inches of water. Make 4-6 slits on outside of spaghetti squash and microwave for 10-12 minutes. Take out the spaghetti using a metal spoon. Repeat for the other half.   Vega protein is good protein powder, make sure you use ~6 ice cubes to give it  smoothie consistency together with ~4-6 ounces of vanilla soy milk. Throw cinnamon into your shake, use peanut butter. You can also use the fruits that I listed above. Throw spinach or kale into the shake.     IF you received an x-ray today, you will receive an invoice from [facility]. Please contact [facility] at [phone] with questions or concerns regarding your invoice.   IF you received labwork today, you will receive an invoice from [facility]. Please contact [facility] at [phone] with questions or concerns regarding your invoice.   Our billing staff will not be able to assist you with questions regarding bills from these companies.  You will be contacted with the lab results as soon as they are available. The fastest way to get your results is to activate your My Chart account. Instructions are located on the last page of this paperwork. If you have not heard from [place] regarding the results in 2 weeks, please contact this office.

## 2016-12-26 NOTE — Progress Notes (Signed)
  MRN: [id number] DOB: 29-May-1961  Subjective:   Theresa [name] is a 56 y.o. female presenting for chief complaint of Establish Care (Was in hospital from 4/2-4/5 (Needed a hospital f/u for abd pain-LAST BM was Wednesday))  Presenting for persistent upper abdominal pain. Pain is throbbing, achy, comes in waves, located in epigastric to RUQ to right flank region. She is passing gas but has not had bowel movement for 4 days. Has used bentyl for stomach cramps which caused constipation so she stopped this. Has used morphine in hospital for her pain with some relief. She does admit that when she walks, helps relieve her pain. Also admits that heavy, greasy foods affect her as well, makes pain come on. Denies fever, n/v, bloody stools, diarrhea, dysuria, hematuria, radiation of her belly pain. Of note, has a complicated history of lung cancer treated in 2016 and cervical cancer treated in 2011. She is managed by Dr. Earlie Server for this, her next f/u appointment is in 03/2017. Also, patient was seen in ER 04/02-01/2017, had cmet, cbc, lipase, urinalysis, RUQ U/S and abdominal CT which were all unremarkable. Patient was discharged and advised to f/u for outpatient management. Has also been seen in another urgent care and a different ER, has had negative urine microanalysis.  Theresa [name] has a current medication list which includes the following prescription(s): dicyclomine, ibuprofen, and ondansetron. Also has No Known Allergies. Theresa [name]  has a past medical history of Cancer ([place]); Encounter for antineoplastic chemotherapy (08/27/2015); Hypokalemia (09/30/2015); Non-small cell carcinoma of lung, stage 3 (HCC) (08/08/2015); Paroxysmal atrial flutter (HCC) (09/16/2015); and Pneumonia. Also  has a past surgical history that includes Wisdom tooth extraction; Tubal ligation; and Video bronchoscopy with endobronchial ultrasound (N/A, 08/02/2015).  Objective:   Vitals: BP 131/84   Pulse (!) 111   Temp 97.8 F (36.6 C) (Oral)    Resp 18   Ht 5' 7.5" (1.715 m)   Wt 237 lb 8 oz (107.7 kg)   SpO2 96%   BMI 36.65 kg/m   Pulse was 98 on recheck by PA-Cipriana Biller.  Physical Exam  Constitutional: She is oriented to person, place, and time. She appears well-developed and well-nourished.  HENT:  Mouth/Throat: Oropharynx is clear and moist.  Eyes: No scleral icterus.  Neck: Normal range of motion. Neck supple. No thyromegaly present.  Cardiovascular: Normal rate, regular rhythm and intact distal pulses.  Exam reveals no gallop and no friction rub.   No murmur heard. Pulmonary/Chest: No respiratory distress. She has no wheezes. She has no rales.  Abdominal: Soft. Bowel sounds are normal. She exhibits no distension and no mass. There is no tenderness. There is no guarding.  No CVA tenderness.  Neurological: She is alert and oriented to person, place, and time.  Skin: Skin is warm and dry. Capillary refill takes less than 2 seconds.  Psychiatric: She has a normal mood and affect.   Assessment and Plan :   1. RUQ pain 2. Constipation, unspecified constipation type 3. Left flank pain 4. Abdominal pain, epigastric - Unclear etiology, labs pending. Recommended dietary modifications for now, start constipation management. Referral to GI pending. Return-to-clinic precautions discussed, patient verbalized understanding. Otherwise, f/u on 12/31/2016.  5. History of lung cancer 6. History of cervical cancer - I recommended patient try to set up a follow up appointment with her oncologist for an early recheck.  Theresa Eagles, PA-C [facility] [phone] 12/26/2016  11:51 AM

## 2016-12-28 ENCOUNTER — Telehealth: Payer: Self-pay | Admitting: Medical Oncology

## 2016-12-28 NOTE — Telephone Encounter (Signed)
fyi update on PCP visit last week abd pain , ct scan. PCP referring her to GI.

## 2016-12-29 ENCOUNTER — Other Ambulatory Visit: Payer: Self-pay | Admitting: Urgent Care

## 2016-12-29 ENCOUNTER — Telehealth: Payer: Self-pay | Admitting: Urgent Care

## 2016-12-29 LAB — H. PYLORI BREATH TEST: H. pylori UBiT: POSITIVE — AB

## 2016-12-29 MED ORDER — CLARITHROMYCIN 500 MG PO TABS: 500.0000 mg | ORAL_TABLET | Freq: Two times a day (BID) | ORAL | 0 refills | Status: DC

## 2016-12-29 MED ORDER — AMOXICILLIN 500 MG PO CAPS: 1000.0000 mg | ORAL_CAPSULE | Freq: Two times a day (BID) | ORAL | 0 refills | Status: DC

## 2016-12-29 MED ORDER — OMEPRAZOLE 20 MG PO CPDR: 20.0000 mg | DELAYED_RELEASE_CAPSULE | Freq: Two times a day (BID) | ORAL | 0 refills | Status: DC

## 2016-12-29 NOTE — Telephone Encounter (Signed)
l/m with h pylori result

## 2016-12-29 NOTE — Telephone Encounter (Signed)
Pt calling wanting to know what virus it is that she has. Pt said Bess Harvest called her this morning and told her the name of it but she forgot and just wanted to know what it was. Best pt callback number is [phone].

## 2016-12-31 ENCOUNTER — Ambulatory Visit: Payer: Medicare Other | Admitting: Urgent Care

## 2017-01-01 ENCOUNTER — Telehealth: Payer: Self-pay | Admitting: Urgent Care

## 2017-01-01 DIAGNOSIS — R1011 Right upper quadrant pain: Secondary | ICD-10-CM | POA: Diagnosis not present

## 2017-01-01 DIAGNOSIS — B9681 Helicobacter pylori [H. pylori] as the cause of diseases classified elsewhere: Secondary | ICD-10-CM | POA: Diagnosis not present

## 2017-01-01 DIAGNOSIS — Z1211 Encounter for screening for malignant neoplasm of colon: Secondary | ICD-10-CM | POA: Diagnosis not present

## 2017-01-01 NOTE — Telephone Encounter (Signed)
Pt advised 2 antibioitics and prilosec

## 2017-01-01 NOTE — Telephone Encounter (Signed)
Pt calling asking if she is supposed to be taking 3 antibiotics or 2. Pt currently taking amoxicillin and naproxen sodium. She is wondering if a third was written or only the 2. Please advise.

## 2017-01-14 ENCOUNTER — Telehealth: Payer: Self-pay | Admitting: Family Medicine

## 2017-01-14 NOTE — Telephone Encounter (Signed)
[name] PT CALLED STATING THAT SHE STILL HAVE RT SIDE PAIN AND WOULD LIKE FOR YOU TO CALL HER

## 2017-01-15 NOTE — Telephone Encounter (Signed)
PATIENT DID GO SEE GI DOCTOR YESTERDAY HER NEXT APPOINTMENT IS 01-27-2017 SHE SPOKE WITH ONCOLOGIST HER APPOINTMENT WITH HIM IS IN July PATIENT STATES THAT SHE FEEL MUCH BETTER TODAY AND HAVE 2 DAYS WORTH OF ANTIBIOTICS LEFT

## 2017-01-15 NOTE — Telephone Encounter (Signed)
lmtcb  Gone to gi? Oncology? New symptoms?

## 2017-01-15 NOTE — Telephone Encounter (Signed)
I recommend patient finish antibiotic treatment for H. Pylori and keep appointments with GI and oncology. If her symptoms drastically worsen, rtc for recheck.

## 2017-01-19 ENCOUNTER — Encounter: Payer: Self-pay | Admitting: Urgent Care

## 2017-01-19 ENCOUNTER — Ambulatory Visit (INDEPENDENT_AMBULATORY_CARE_PROVIDER_SITE_OTHER): Payer: Medicare Other | Admitting: Urgent Care

## 2017-01-19 VITALS — BP 117/78 | HR 125 | Temp 98.2°F | Resp 16 | Ht 68.0 in | Wt 240.2 lb

## 2017-01-19 DIAGNOSIS — R030 Elevated blood-pressure reading, without diagnosis of hypertension: Secondary | ICD-10-CM | POA: Diagnosis not present

## 2017-01-19 DIAGNOSIS — R1011 Right upper quadrant pain: Secondary | ICD-10-CM

## 2017-01-19 DIAGNOSIS — R1013 Epigastric pain: Secondary | ICD-10-CM | POA: Diagnosis not present

## 2017-01-19 DIAGNOSIS — A048 Other specified bacterial intestinal infections: Secondary | ICD-10-CM | POA: Diagnosis not present

## 2017-01-19 MED ORDER — TRAZODONE HCL 50 MG PO TABS: 25.0000 mg | ORAL_TABLET | Freq: Every evening | ORAL | 3 refills | Status: DC | PRN

## 2017-01-19 NOTE — Patient Instructions (Addendum)
You can use Anaprox twice daily as needed for pain. You can also use Trazodone to help with sleep related to your pain. Keep your appointment with Dr. Paulita Fujita. Hydrate very well with 2 liters of water daily.  Salads - Kale, Spinach, Cabbage, Spring mix Fruits - Avocadoes, berries (blueberries, raspberries, blackberries), apples, oranges, pomegranate Seeds - Quinoa, [place] seeds; you can also incorporate oatmeal Vegetables - aspargus, mashed cauliflower, broccoli, green beans, brussel spouts, bell peppers; stay away from starchy vegetables like potatoes, carrots, peas  Brussel sprouts - Cut off stems. Place in a mixing bowl that has a lid. Pour in a 1/4-1/2 cup olive oil, spices, use a light amount of parmesan. Place on a baking sheet. Bake for 10 minutes at 400F. Take it out, eat the brussel chips. Place for another 5-10 minutes.   Mashed cauliflower - Boil a bunch of cauliflower in a pot of water. Blend in a food processor with 1-2 tablespoons of butter.  Spaghetti squash -  Cut the squash in half very carefully, clean out seeds from the middle. Place 1/2 face down in a microwave safe dish with at least 2 inches of water. Make 4-6 slits on outside of spaghetti squash and microwave for 10-12 minutes. Take out the spaghetti using a metal spoon. Repeat for the other half.   Vega protein is good protein powder, make sure you use ~6 ice cubes to give it smoothie consistency together with ~4-6 ounces of vanilla soy milk. Throw cinnamon into your shake, use peanut butter. You can also use the fruits that I listed above. Throw spinach or kale into the shake.     Abdominal Pain, Adult Abdominal pain can be caused by many things. Often, abdominal pain is not serious and it gets better with no treatment or by being treated at home. However, sometimes abdominal pain is serious. Your health care provider will do a medical history and a physical exam to try to determine the cause of your abdominal pain. Follow  these instructions at home:  Take over-the-counter and prescription medicines only as told by your health care provider. Do not take a laxative unless told by your health care provider.  Drink enough fluid to keep your urine clear or pale yellow.  Watch your condition for any changes.  Keep all follow-up visits as told by your health care provider. This is important. Contact a health care provider if:  Your abdominal pain changes or gets worse.  You are not hungry or you lose weight without trying.  You are constipated or have diarrhea for more than 2-3 days.  You have pain when you urinate or have a bowel movement.  Your abdominal pain wakes you up at night.  Your pain gets worse with meals, after eating, or with certain foods.  You are throwing up and cannot keep anything down.  You have a fever. Get help right away if:  Your pain does not go away as soon as your health care provider told you to expect.  You cannot stop throwing up.  Your pain is only in areas of the abdomen, such as the right side or the left lower portion of the abdomen.  You have bloody or black stools, or stools that look like tar.  You have severe pain, cramping, or bloating in your abdomen.  You have signs of dehydration, such as:  Dark urine, very little urine, or no urine.  Cracked lips.  Dry mouth.  Sunken eyes.  Sleepiness.  Weakness. This  information is not intended to replace advice given to you by your health care provider. Make sure you discuss any questions you have with your health care provider. Document Released: 06/17/2005 Document Revised: 03/27/2016 Document Reviewed: 02/19/2016 Elsevier Interactive Patient Education  2017 [employer].     IF you received an x-ray today, you will receive an invoice from [facility]. Please contact [facility] at [phone] with questions or concerns regarding your invoice.   IF you received labwork today, you  will receive an invoice from [facility]. Please contact [facility] at [phone] with questions or concerns regarding your invoice.   Our billing staff will not be able to assist you with questions regarding bills from these companies.  You will be contacted with the lab results as soon as they are available. The fastest way to get your results is to activate your My Chart account. Instructions are located on the last page of this paperwork. If you have not heard from [place] regarding the results in 2 weeks, please contact this office.

## 2017-01-19 NOTE — Progress Notes (Addendum)
    MRN: [id number] DOB: [birth year]  Subjective:   Theresa [name] is a 56 y.o. female presenting for follow up on abdominal pain. Patient's last OV was 12/26/2016 and tested positive for H. Pylori. She has completed the antibiotic course but patient still has pain. Reports that has ongoing RUQ abdominal pain. Her pain is intermittent, mild, occasional. She has made significant dietary modifications. Still has constipation, has ~1 BM per day. Patient did contact her oncologist to talk with her about her abdominal pain. Her oncologist's recommendation was to finish treatment of h. Pylori and keep appointments with GI. She is scheduled to talk with Dr. Paulita Fujita for an endoscopy and colonoscopy since she has not had one.   Theresa [name] has a current medication list which includes the following prescription(s): docusate sodium and polyethylene glycol powder. Also has No Known Allergies.  Theresa [name]  has a past medical history of Cancer ([place]); Encounter for antineoplastic chemotherapy (08/27/2015); Hypokalemia (09/30/2015); Non-small cell carcinoma of lung, stage 3 (HCC) (08/08/2015); Paroxysmal atrial flutter (HCC) (09/16/2015); and Pneumonia. Also  has a past surgical history that includes Wisdom tooth extraction; Tubal ligation; and Video bronchoscopy with endobronchial ultrasound (N/A, 08/02/2015).  Objective:   Vitals: BP (!) 151/81   Pulse (!) 125   Temp 98.2 F (36.8 C) (Oral)   Resp 16   Ht '5\' 8"'$  (1.727 m)   Wt 240 lb 3.2 oz (109 kg)   SpO2 100%   BMI 36.52 kg/m   Wt Readings from Last 3 Encounters:  01/19/17 240 lb 3.2 oz (109 kg)  12/26/16 237 lb 8 oz (107.7 kg)  12/24/16 241 lb (109.3 kg)   BP Readings from Last 3 Encounters:  01/19/17 (!) 151/81  12/26/16 131/84  12/24/16 (!) 152/81   Physical Exam  Constitutional: She is oriented to person, place, and time. She appears well-developed and well-nourished.  HENT:  Mouth/Throat: Oropharynx is clear and moist.  Eyes: No scleral icterus.    Cardiovascular: Normal rate, regular rhythm and intact distal pulses.  Exam reveals no gallop and no friction rub.   No murmur heard. Pulmonary/Chest: No respiratory distress. She has no wheezes. She has no rales.  Abdominal: Soft. Bowel sounds are normal. She exhibits no distension and no mass. There is no tenderness. There is no guarding.  Neurological: She is alert and oriented to person, place, and time.  Skin: Skin is warm and dry.  Psychiatric: She has a normal mood and affect.   Assessment and Plan :   1. RUQ pain - Keep appointment with Dr. Paulita Fujita, f/u with [place] thereafter.  2. H. pylori infection 3. Abdominal pain, epigastric - Resolved. Follow up as needed.  4. Elevated blood pressure reading - Monitor, will recheck at next visit and if still elevated will start HTN medication.  Theresa Eagles, PA-C [facility] [phone] 01/19/2017 2:45 PM

## 2017-01-26 ENCOUNTER — Telehealth: Payer: Self-pay | Admitting: Urgent Care

## 2017-01-26 NOTE — Telephone Encounter (Signed)
Would you be willing to write this?

## 2017-01-26 NOTE — Telephone Encounter (Signed)
Patient would like to have Theresa [name] give her a call to see if he could refer her or write a letter for her to go to the [facility] for water aerobics, states that she can get a lower rate is she has a doctors order for this. And she is on a fixed income.  Her call back number is [phone]

## 2017-01-27 NOTE — Telephone Encounter (Signed)
Letter written, printed and signed. Please let patient know it's ready for pick up.

## 2017-01-27 NOTE — Telephone Encounter (Signed)
Pt aware forms are ready for pick up Will be next Monday

## 2017-02-01 DIAGNOSIS — Z1211 Encounter for screening for malignant neoplasm of colon: Secondary | ICD-10-CM | POA: Diagnosis not present

## 2017-02-01 DIAGNOSIS — K5730 Diverticulosis of large intestine without perforation or abscess without bleeding: Secondary | ICD-10-CM | POA: Diagnosis not present

## 2017-02-01 DIAGNOSIS — K644 Residual hemorrhoidal skin tags: Secondary | ICD-10-CM | POA: Diagnosis not present

## 2017-02-01 DIAGNOSIS — K2970 Gastritis, unspecified, without bleeding: Secondary | ICD-10-CM | POA: Diagnosis not present

## 2017-02-01 DIAGNOSIS — K648 Other hemorrhoids: Secondary | ICD-10-CM | POA: Diagnosis not present

## 2017-02-01 DIAGNOSIS — D126 Benign neoplasm of colon, unspecified: Secondary | ICD-10-CM | POA: Diagnosis not present

## 2017-02-01 DIAGNOSIS — R1011 Right upper quadrant pain: Secondary | ICD-10-CM | POA: Diagnosis not present

## 2017-02-04 DIAGNOSIS — Z1211 Encounter for screening for malignant neoplasm of colon: Secondary | ICD-10-CM | POA: Diagnosis not present

## 2017-02-04 DIAGNOSIS — D126 Benign neoplasm of colon, unspecified: Secondary | ICD-10-CM | POA: Diagnosis not present

## 2017-02-11 ENCOUNTER — Ambulatory Visit (INDEPENDENT_AMBULATORY_CARE_PROVIDER_SITE_OTHER): Payer: Medicare Other | Admitting: Urgent Care

## 2017-02-11 ENCOUNTER — Encounter: Payer: Self-pay | Admitting: Urgent Care

## 2017-02-11 VITALS — BP 115/80 | HR 122 | Resp 16 | Ht 67.0 in | Wt 236.8 lb

## 2017-02-11 DIAGNOSIS — R109 Unspecified abdominal pain: Secondary | ICD-10-CM | POA: Diagnosis not present

## 2017-02-11 DIAGNOSIS — R1011 Right upper quadrant pain: Secondary | ICD-10-CM

## 2017-02-11 MED ORDER — KETOROLAC TROMETHAMINE 60 MG/2ML IM SOLN
60.0000 mg | Freq: Once | INTRAMUSCULAR | Status: AC
  Administered 2017-02-11: 60 mg via INTRAMUSCULAR

## 2017-02-11 NOTE — Patient Instructions (Addendum)
Please do NOT eat any more fried foods.  Salads - Kale, Spinach, Cabbage, Spring mix (you can also put hard boiled egg into your salad); you can pumpkin seeds, sunflower seeds Fruits - Avocadoes, berries (blueberries, raspberries, blackberries), apples, oranges, pomegranate, mango, grapefruit Seeds - Quinoa, Chia seeds, flax seeds; you can also incorporate oatmeal or plain yogurt Vegetables - aspargus, mashed cauliflower, beets, broccoli, green beans, brussel spouts, bell peppers; stay away from starchy vegetables like potatoes, carrots, peas  Brussel sprouts - Cut off stems. Place in a mixing bowl that has a lid. Pour in a 1/4-1/2 cup olive oil, spices, use a light amount of parmesan. Place on a baking sheet. Bake for 10 minutes at 400F. Take it out, eat the brussel chips. Place for another 5-10 minutes.   Mashed cauliflower - Boil a bunch of cauliflower in a pot of water. Blend in a food processor with 1-2 tablespoons of butter.  Spaghetti squash -  Cut the squash in half very carefully, clean out seeds from the middle. Place 1/2 face down in a microwave safe dish with at least 2 inches of water. Make 4-6 slits on outside of spaghetti squash and microwave for 10-12 minutes. Take out the spaghetti using a metal spoon. Repeat for the other half.   Vega protein is good protein powder, make sure you use ~6 ice cubes to give it smoothie consistency together with ~4-6 ounces of vanilla soy milk. Throw cinnamon into your shake, use peanut butter. You can also use the fruits that I listed above. Throw spinach or kale into the shake.   You should drink 2 liters of plain water daily (1 galloon). No sugary beverages.   You may take 500mg  Tylenol with ibuprofen 400-600mg  every 6 hours for pain and inflammation.   Abdominal Pain, Adult Abdominal pain can be caused by many things. Often, abdominal pain is not serious and it gets better with no treatment or by being treated at home. However, sometimes  abdominal pain is serious. Your health care provider will do a medical history and a physical exam to try to determine the cause of your abdominal pain. Follow these instructions at home:  Take over-the-counter and prescription medicines only as told by your health care provider. Do not take a laxative unless told by your health care provider.  Drink enough fluid to keep your urine clear or pale yellow.  Watch your condition for any changes.  Keep all follow-up visits as told by your health care provider. This is important. Contact a health care provider if:  Your abdominal pain changes or gets worse.  You are not hungry or you lose weight without trying.  You are constipated or have diarrhea for more than 2-3 days.  You have pain when you urinate or have a bowel movement.  Your abdominal pain wakes you up at night.  Your pain gets worse with meals, after eating, or with certain foods.  You are throwing up and cannot keep anything down.  You have a fever. Get help right away if:  Your pain does not go away as soon as your health care provider told you to expect.  You cannot stop throwing up.  Your pain is only in areas of the abdomen, such as the right side or the left lower portion of the abdomen.  You have bloody or black stools, or stools that look like tar.  You have severe pain, cramping, or bloating in your abdomen.  You have signs of dehydration,  such as:  Dark urine, very little urine, or no urine.  Cracked lips.  Dry mouth.  Sunken eyes.  Sleepiness.  Weakness. This information is not intended to replace advice given to you by your health care provider. Make sure you discuss any questions you have with your health care provider. Document Released: 06/17/2005 Document Revised: 03/27/2016 Document Reviewed: 02/19/2016 Elsevier Interactive Patient Education  2017 [employer].     IF you received an x-ray today, you will receive an invoice from  [facility]. Please contact [facility] at [phone] with questions or concerns regarding your invoice.   IF you received labwork today, you will receive an invoice from [facility]. Please contact [facility] at [phone] with questions or concerns regarding your invoice.   Our billing staff will not be able to assist you with questions regarding bills from these companies.  You will be contacted with the lab results as soon as they are available. The fastest way to get your results is to activate your My Chart account. Instructions are located on the last page of this paperwork. If you have not heard from [place] regarding the results in 2 weeks, please contact this office.

## 2017-02-11 NOTE — Progress Notes (Signed)
  MRN: [id number] DOB: [birth year]  Subjective:   Theresa [name] is a 56 y.o. female presenting for chief complaint of Abdominal Pain (per patient, began appx 2 months ago, more severe when laying down; R side)  Reports ongoing RUQ abdominal pain. Pain comes in waves, is worse at night while laying down. Pain is relieved with sitting up, characterized as sharp and achy, lasts ~30 minutes. Denies fever, n/v, bloody stools, dysuria, hematuria. She is having bowel movements daily, characterized as small, round, hard stools. She has seen GI doctor, had a normal endoscopy and 2 pre-cancerous polyps were removed through colonoscopy. She has tried dietary modifications but still eats fried foods.   Theresa [name] is taking docusate as needed. Also has No Known Allergies. Theresa [name]  has a past medical history of Cancer ([place]); Encounter for antineoplastic chemotherapy (08/27/2015); Hypokalemia (09/30/2015); Non-small cell carcinoma of lung, stage 3 (HCC) (08/08/2015); Paroxysmal atrial flutter (HCC) (09/16/2015); and Pneumonia. Also  has a past surgical history that includes Wisdom tooth extraction; Tubal ligation; and Video bronchoscopy with endobronchial ultrasound (N/A, 08/02/2015).  Objective:   Vitals: BP 115/80 (BP Location: Right Arm, Patient Position: Sitting, Cuff Size: Large)   Pulse (!) 122   Resp 16   Ht 5\' 7"  (1.702 m)   Wt 236 lb 12.8 oz (107.4 kg)   SpO2 98%   BMI 37.09 kg/m   Physical Exam  Constitutional: She is oriented to person, place, and time. She appears well-developed and well-nourished.  HENT:  Mouth/Throat: Oropharynx is clear and moist.  Eyes: No scleral icterus.  Neck: Normal range of motion. Neck supple.  Cardiovascular: Normal rate, regular rhythm and intact distal pulses.  Exam reveals no gallop and no friction rub.   No murmur heard. Pulmonary/Chest: No respiratory distress. She has no wheezes. She has no rales.  Abdominal: Soft. Bowel sounds are normal. She exhibits no  distension and no mass. There is tenderness (RUQ). There is no guarding.  Lymphadenopathy:    She has no cervical adenopathy.  Neurological: She is alert and oriented to person, place, and time.  Skin: Skin is warm and dry.  Psychiatric: She has a normal mood and affect.   Assessment and Plan :   1. RUQ pain 2. Right sided abdominal pain - Reviewed imaging and lab results with patient. She requests repeat of some labs. At this point, I recommended patient make a sincere commitment to eating healthier, managing constipation especially since she has already seen the GI doctor. If she has no improvement still, will consider repeating her CT abdomen/pelvis.  [name], PA-C [facility] [phone] 02/11/2017  3:46 PM

## 2017-02-12 LAB — COMPREHENSIVE METABOLIC PANEL
ALBUMIN: 4.3 g/dL (ref 3.5–5.5)
ALK PHOS: 92 IU/L (ref 39–117)
ALT: 12 IU/L (ref 0–32)
AST: 25 IU/L (ref 0–40)
Albumin/Globulin Ratio: 1.3 (ref 1.2–2.2)
BUN / CREAT RATIO: 10 (ref 9–23)
BUN: 9 mg/dL (ref 6–24)
Bilirubin Total: 0.5 mg/dL (ref 0.0–1.2)
CO2: 23 mmol/L (ref 18–29)
CREATININE: 0.94 mg/dL (ref 0.57–1.00)
Calcium: 9.9 mg/dL (ref 8.7–10.2)
Chloride: 101 mmol/L (ref 96–106)
GFR calc Af Amer: 80 mL/min/{1.73_m2} (ref >59)
GFR calc non Af Amer: 69 mL/min/{1.73_m2} (ref >59)
GLUCOSE: 124 mg/dL — AB (ref 65–99)
Globulin, Total: 3.4 g/dL (ref 1.5–4.5)
Potassium: 3.8 mmol/L (ref 3.5–5.2)
Sodium: 141 mmol/L (ref 134–144)
Total Protein: 7.7 g/dL (ref 6.0–8.5)

## 2017-02-12 LAB — CBC
HEMATOCRIT: 33.7 % — AB (ref 34.0–46.6)
HEMOGLOBIN: 11.3 g/dL (ref 11.1–15.9)
MCH: 30.8 pg (ref 26.6–33.0)
MCHC: 33.5 g/dL (ref 31.5–35.7)
MCV: 92 fL (ref 79–97)
Platelets: 300 10*3/uL (ref 150–379)
RBC: 3.67 x10E6/uL — ABNORMAL LOW (ref 3.77–5.28)
RDW: 16.4 % — ABNORMAL HIGH (ref 12.3–15.4)
WBC: 4.9 10*3/uL (ref 3.4–10.8)

## 2017-02-12 LAB — H. PYLORI BREATH TEST: H pylori Breath Test: NEGATIVE

## 2017-03-11 ENCOUNTER — Emergency Department (HOSPITAL_COMMUNITY)
Admission: EM | Admit: 2017-03-11 | Discharge: 2017-03-11 | Disposition: A | Discharge status: Home / Self Care | Payer: Medicare Other | Attending: Emergency Medicine | Admitting: Emergency Medicine

## 2017-03-11 ENCOUNTER — Emergency Department (HOSPITAL_COMMUNITY): Payer: Medicare Other

## 2017-03-11 ENCOUNTER — Encounter (HOSPITAL_COMMUNITY): Payer: Self-pay

## 2017-03-11 DIAGNOSIS — Z87891 Personal history of nicotine dependence: Secondary | ICD-10-CM | POA: Insufficient documentation

## 2017-03-11 DIAGNOSIS — Z8541 Personal history of malignant neoplasm of cervix uteri: Secondary | ICD-10-CM | POA: Insufficient documentation

## 2017-03-11 DIAGNOSIS — Z85118 Personal history of other malignant neoplasm of bronchus and lung: Secondary | ICD-10-CM | POA: Insufficient documentation

## 2017-03-11 DIAGNOSIS — R1011 Right upper quadrant pain: Secondary | ICD-10-CM | POA: Diagnosis not present

## 2017-03-11 DIAGNOSIS — R000 Tachycardia, unspecified: Secondary | ICD-10-CM | POA: Diagnosis not present

## 2017-03-11 DIAGNOSIS — R109 Unspecified abdominal pain: Secondary | ICD-10-CM | POA: Diagnosis not present

## 2017-03-11 DIAGNOSIS — C3490 Malignant neoplasm of unspecified part of unspecified bronchus or lung: Secondary | ICD-10-CM | POA: Diagnosis not present

## 2017-03-11 DIAGNOSIS — Z79899 Other long term (current) drug therapy: Secondary | ICD-10-CM | POA: Diagnosis not present

## 2017-03-11 LAB — COMPREHENSIVE METABOLIC PANEL
ALT: 18 U/L (ref 14–54)
ANION GAP: 10 (ref 5–15)
AST: 28 U/L (ref 15–41)
Albumin: 4.1 g/dL (ref 3.5–5.0)
Alkaline Phosphatase: 87 U/L (ref 38–126)
BILIRUBIN TOTAL: 0.7 mg/dL (ref 0.3–1.2)
BUN: 10 mg/dL (ref 6–20)
CHLORIDE: 104 mmol/L (ref 101–111)
CO2: 25 mmol/L (ref 22–32)
Calcium: 9.7 mg/dL (ref 8.9–10.3)
Creatinine, Ser: 0.91 mg/dL (ref 0.44–1.00)
Glucose, Bld: 130 mg/dL — ABNORMAL HIGH (ref 65–99)
POTASSIUM: 3.3 mmol/L — AB (ref 3.5–5.1)
Sodium: 139 mmol/L (ref 135–145)
Total Protein: 8.2 g/dL — ABNORMAL HIGH (ref 6.5–8.1)

## 2017-03-11 LAB — CBC WITH DIFFERENTIAL/PLATELET
BASOS ABS: 0 10*3/uL (ref 0.0–0.1)
BASOS PCT: 0 %
EOS PCT: 0 %
Eosinophils Absolute: 0 10*3/uL (ref 0.0–0.7)
HEMATOCRIT: 32.1 % — AB (ref 36.0–46.0)
Hemoglobin: 11.2 g/dL — ABNORMAL LOW (ref 12.0–15.0)
Lymphocytes Relative: 23 %
Lymphs Abs: 1.4 10*3/uL (ref 0.7–4.0)
MCH: 30.5 pg (ref 26.0–34.0)
MCHC: 34.9 g/dL (ref 30.0–36.0)
MCV: 87.5 fL (ref 78.0–100.0)
MONO ABS: 0.5 10*3/uL (ref 0.1–1.0)
Monocytes Relative: 8 %
NEUTROS ABS: 4.2 10*3/uL (ref 1.7–7.7)
Neutrophils Relative %: 69 %
PLATELETS: 306 10*3/uL (ref 150–400)
RBC: 3.67 MIL/uL — ABNORMAL LOW (ref 3.87–5.11)
RDW: 15.5 % (ref 11.5–15.5)
WBC: 6.1 10*3/uL (ref 4.0–10.5)

## 2017-03-11 LAB — URINALYSIS, ROUTINE W REFLEX MICROSCOPIC
BILIRUBIN URINE: NEGATIVE
Bacteria, UA: NONE SEEN
GLUCOSE, UA: NEGATIVE mg/dL
HGB URINE DIPSTICK: NEGATIVE
KETONES UR: NEGATIVE mg/dL
NITRITE: NEGATIVE
PH: 7 (ref 5.0–8.0)
PROTEIN: NEGATIVE mg/dL
Specific Gravity, Urine: 1.016 (ref 1.005–1.030)

## 2017-03-11 LAB — TROPONIN I: Troponin I: <0.03 ng/mL (ref <0.03)

## 2017-03-11 LAB — LIPASE, BLOOD: LIPASE: 19 U/L (ref 11–51)

## 2017-03-11 MED ORDER — HYDROCODONE-ACETAMINOPHEN 5-325 MG PO TABS: ORAL_TABLET | ORAL | 0 refills | Status: DC

## 2017-03-11 MED ORDER — ONDANSETRON HCL 4 MG/2ML IJ SOLN
4.0000 mg | Freq: Once | INTRAMUSCULAR | Status: AC
  Administered 2017-03-11: 4 mg via INTRAVENOUS
  Filled 2017-03-11: qty 2

## 2017-03-11 MED ORDER — ONDANSETRON HCL 4 MG PO TABS: 4.0000 mg | ORAL_TABLET | Freq: Three times a day (TID) | ORAL | 0 refills | Status: DC | PRN

## 2017-03-11 MED ORDER — IOPAMIDOL (ISOVUE-300) INJECTION 61%
INTRAVENOUS | Status: AC
  Administered 2017-03-11: 100 mL
  Filled 2017-03-11: qty 100

## 2017-03-11 MED ORDER — FAMOTIDINE IN NACL 20-0.9 MG/50ML-% IV SOLN
20.0000 mg | Freq: Once | INTRAVENOUS | Status: AC
  Administered 2017-03-11: 20 mg via INTRAVENOUS
  Filled 2017-03-11: qty 50

## 2017-03-11 MED ORDER — DICYCLOMINE HCL 10 MG/ML IM SOLN
20.0000 mg | Freq: Once | INTRAMUSCULAR | Status: AC
  Administered 2017-03-11: 20 mg via INTRAMUSCULAR
  Filled 2017-03-11: qty 2

## 2017-03-11 NOTE — Discharge Instructions (Signed)
Eat a bland diet, avoiding greasy, fatty, fried foods, as well as spicy and acidic foods or beverages.  Avoid eating within the hour or 2 before going to bed or laying down.  Also avoid teas, colas, coffee, chocolate, pepermint and spearment.  Take over the counter pepcid, one tablet by mouth twice a day, for the next 2 to 3 weeks.  May also take over the counter maalox/mylanta, as directed on packaging, as needed for discomfort.  Take the prescriptions as directed.  You may need further outpatient testing, such as a HIDA scan. Call your regular medical doctor and your GI doctor tomorrow to schedule a follow up appointment within the next week.  Return to the Emergency Department immediately if worsening.

## 2017-03-11 NOTE — ED Provider Notes (Signed)
[facility] DEPT Provider Note   CSN: [id number] Arrival date & time: 03/11/17  1010     History   Chief Complaint Chief Complaint  Patient presents with  . Abdominal Pain  . Nausea    HPI Theresa [name] is a 56 y.o. female.  HPI  Pt was seen at 1120.   Per pt, c/o gradual onset and persistence of constant right sided abd "pain" for the past 3 months. Describes the abd pain as "sharp" and "stabbing," worsens after eating, and radiates into her right flank. Denies N/V, no diarrhea, no fevers, no back pain, no rash, no CP/SOB, no black or blood in stools. The symptoms have been associated with no other complaints. The patient has a significant history of similar symptoms previously, recently being evaluated for this complaint and multiple prior evals for same. Pt has been evaluated by her PMD several times, as well as GI MD (had EGD and colonoscopy) for these symptoms with reassuring workup, dx constipation.       Past Medical History:  Diagnosis Date  . Cancer ([place])    5 years ago - cervical cancer  . Encounter for antineoplastic chemotherapy 08/27/2015  . Hypokalemia 09/30/2015  . Non-small cell carcinoma of lung, stage 3 ([place]) 08/08/2015  . Paroxysmal atrial flutter ([place]) 09/16/2015  . Pneumonia     Patient Active Problem List   Diagnosis Date Noted  . Dehydration 10/11/2015  . Nausea with vomiting 10/11/2015  . Chemotherapy induced neutropenia ([place]) 09/30/2015  . Hypokalemia 09/30/2015  . 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 Antineoplastic chemotherapy induced pancytopenia ([place]) 09/18/2015  . Constipation 09/18/2015  . Intractable nausea and vomiting 09/18/2015  . Typical atrial flutter ([place])   . Malignant neoplasm of overlapping sites of right lung ([place])   . Malnutrition of moderate degree 09/17/2015  . Chest pain 09/16/2015  . Tachycardia 09/16/2015  . Pain in the chest   . Encounter for antineoplastic chemotherapy 08/27/2015  . Non-small cell  carcinoma of lung, stage 3 ([place]) 08/08/2015  . Lung mass 07/16/2015  . Malignant neoplasm of cervix ([place]) 10/08/2011    Past Surgical History:  Procedure Laterality Date  . TUBAL LIGATION    . VIDEO BRONCHOSCOPY WITH ENDOBRONCHIAL ULTRASOUND N/A 08/02/2015   Procedure: VIDEO BRONCHOSCOPY WITH ENDOBRONCHIAL ULTRASOUND;  Surgeon: [name], MD;  Location: [facility];  Service: Thoracic;  Laterality: N/A;  . WISDOM TOOTH EXTRACTION      OB History    No data available       Home Medications    Prior to Admission medications   Medication Sig Start Date End Date Taking? Authorizing Provider  docusate sodium (COLACE) 50 MG capsule Take 1 capsule (50 mg total) by mouth 2 (two) times daily. Patient not taking: Reported on 03/11/2017 12/26/16   [name], PA-C    Family History Family History  Problem Relation Age of Onset  . COPD Mother   . Diabetes type II Mother   . Heart disease Mother   . High Cholesterol Mother   . Lung cancer Father     Social History Social History  Substance Use Topics  . Smoking status: Former Smoker    Packs/day: 0.30    Years: 5.00    Types: Cigarettes    Quit date: 07/31/1990  . Smokeless tobacco: Never Used  . Alcohol use No     Allergies   Patient has no known allergies.   Review of Systems Review of Systems ROS: Statement: All  systems negative except as marked or noted in the HPI; Constitutional: Negative for fever and chills. ; ; Eyes: Negative for eye pain, redness and discharge. ; ; ENMT: Negative for ear pain, hoarseness, nasal congestion, sinus pressure and sore throat. ; ; Cardiovascular: Negative for chest pain, palpitations, diaphoresis, dyspnea and peripheral edema. ; ; Respiratory: Negative for cough, wheezing and stridor. ; ; Gastrointestinal: +abd pain. Negative for nausea, vomiting, diarrhea, blood in stool, hematemesis, jaundice and rectal bleeding. . ; ; Genitourinary: Negative for dysuria, flank pain and hematuria. ;  ; Musculoskeletal: Negative for back pain and neck pain. Negative for swelling and trauma.; ; Skin: Negative for pruritus, rash, abrasions, blisters, bruising and skin lesion.; ; Neuro: Negative for headache, lightheadedness and neck stiffness. Negative for weakness, altered level of consciousness, altered mental status, extremity weakness, paresthesias, involuntary movement, seizure and syncope.       Physical Exam Updated Vital Signs BP 103/80   Pulse (!) 132   Temp 98.2 F (36.8 C) (Oral)   Resp 16   Ht 5\' 8"  (1.727 m)   Wt 108.9 kg (240 lb)   SpO2 100%   BMI 36.49 kg/m   Physical Exam 1125: Physical examination:  Nursing notes reviewed; Vital signs and O2 SAT reviewed;  Constitutional: Well developed, Well nourished, Well hydrated, In no acute distress; Head:  Normocephalic, atraumatic; Eyes: EOMI, PERRL, No scleral icterus; ENMT: Mouth and pharynx normal, Mucous membranes moist; Neck: Supple, Full range of motion, No lymphadenopathy; Cardiovascular: Regular rate and rhythm, No gallop; Respiratory: Breath sounds clear & equal bilaterally, No wheezes.  Speaking full sentences with ease, Normal respiratory effort/excursion; Chest: Nontender, Movement normal; Abdomen: Soft, +RUQ tenderness to palp. No rebound or guarding. Nondistended, Normal bowel sounds; Genitourinary: No CVA tenderness; Spine:  No midline CS, TS, LS tenderness. No rash.;; Extremities: Pulses normal, No tenderness, No edema, No calf edema or asymmetry.; Neuro: AA&Ox3, Major CN grossly intact.  Speech clear. No gross focal motor or sensory deficits in extremities.; Skin: Color normal, Warm, Dry.   ED Treatments / Results  Labs (all labs ordered are listed, but only abnormal results are displayed)   EKG  EKG Interpretation None       Radiology   Procedures Procedures (including critical care time)  Medications Ordered in ED Medications  dicyclomine (BENTYL) injection 20 mg (20 mg Intramuscular Given  03/11/17 1144)  famotidine (PEPCID) IVPB 20 mg premix (20 mg Intravenous New Bag/Given 03/11/17 1140)  ondansetron (ZOFRAN) injection 4 mg (4 mg Intravenous Given 03/11/17 1139)     Initial Impression / Assessment and Plan / ED Course  I have reviewed the triage vital signs and the nursing notes.  Pertinent labs & imaging results that were available during my care of the patient were reviewed by me and considered in my medical decision making (see chart for details).  MDM Reviewed: previous chart, nursing note and vitals Reviewed previous: labs Interpretation: labs, CT scan and x-ray   Results for orders placed or performed during the hospital encounter of 03/11/17  Lipase, blood  Result Value Ref Range   Lipase 19 11 - 51 U/L  Comprehensive metabolic panel  Result Value Ref Range   Sodium 139 135 - 145 mmol/L   Potassium 3.3 (L) 3.5 - 5.1 mmol/L   Chloride 104 101 - 111 mmol/L   CO2 25 22 - 32 mmol/L   Glucose, Bld 130 (H) 65 - 99 mg/dL   BUN 10 6 - 20 mg/dL   Creatinine, Ser  0.91 0.44 - 1.00 mg/dL   Calcium 9.7 8.9 - 10.3 mg/dL   Total Protein 8.2 (H) 6.5 - 8.1 g/dL   Albumin 4.1 3.5 - 5.0 g/dL   AST 28 15 - 41 U/L   ALT 18 14 - 54 U/L   Alkaline Phosphatase 87 38 - 126 U/L   Total Bilirubin 0.7 0.3 - 1.2 mg/dL   GFR calc non Af Amer >60 >60 mL/min   GFR calc Af Amer >60 >60 mL/min   Anion gap 10 5 - 15  Urinalysis, Routine w reflex microscopic  Result Value Ref Range   Color, Urine STRAW (A) YELLOW   APPearance CLEAR CLEAR   Specific Gravity, Urine 1.016 1.005 - 1.030   pH 7.0 5.0 - 8.0   Glucose, UA NEGATIVE NEGATIVE mg/dL   Hgb urine dipstick NEGATIVE NEGATIVE   Bilirubin Urine NEGATIVE NEGATIVE   Ketones, ur NEGATIVE NEGATIVE mg/dL   Protein, ur NEGATIVE NEGATIVE mg/dL   Nitrite NEGATIVE NEGATIVE   Leukocytes, UA LARGE (A) NEGATIVE   RBC / HPF 0-5 0 - 5 RBC/hpf   WBC, UA 0-5 0 - 5 WBC/hpf   Bacteria, UA NONE SEEN NONE SEEN   Squamous Epithelial / LPF 0-5 (A)  NONE SEEN   Mucous PRESENT   CBC with Differential  Result Value Ref Range   WBC 6.1 4.0 - 10.5 K/uL   RBC 3.67 (L) 3.87 - 5.11 MIL/uL   Hemoglobin 11.2 (L) 12.0 - 15.0 g/dL   HCT 32.1 (L) 36.0 - 46.0 %   MCV 87.5 78.0 - 100.0 fL   MCH 30.5 26.0 - 34.0 pg   MCHC 34.9 30.0 - 36.0 g/dL   RDW 15.5 11.5 - 15.5 %   Platelets 306 150 - 400 K/uL   Neutrophils Relative % 69 %   Neutro Abs 4.2 1.7 - 7.7 K/uL   Lymphocytes Relative 23 %   Lymphs Abs 1.4 0.7 - 4.0 K/uL   Monocytes Relative 8 %   Monocytes Absolute 0.5 0.1 - 1.0 K/uL   Eosinophils Relative 0 %   Eosinophils Absolute 0.0 0.0 - 0.7 K/uL   Basophils Relative 0 %   Basophils Absolute 0.0 0.0 - 0.1 K/uL  Troponin I  Result Value Ref Range   Troponin I <0.03 <0.03 ng/mL   Dg Chest 2 View Result Date: 03/11/2017 CLINICAL DATA:  Previous right-sided lung carcinoma volume loss right EXAM: CHEST  2 VIEW COMPARISON:  Chest CT November 26, 2016 FINDINGS: Volume loss with radiation therapy change in the right remains, similar to most recent prior study. Left lung is mildly hyperexpanded but clear. Heart size is within normal limits. There is normal appearing pulmonary vascularity on the left. Pulmonary vascularity is somewhat distorted on the right stable. No adenopathy appreciable. No bone lesions evident. IMPRESSION: Persistent volume loss on the right with radiation therapy change, primarily medially. This appearance is stable compared to chest CT from approximately 3 months prior. Left lung mildly hyperexpanded clear. No new opacity on either side. Stable cardiac silhouette. No adenopathy demonstrable. Electronically Signed   By: Lowella Grip III M.D.   On: 03/11/2017 12:59   Ct Abdomen Pelvis W Contrast Result Date: 03/11/2017 CLINICAL DATA:  Right abdomen pain for 3 months EXAM: CT ABDOMEN AND PELVIS WITH CONTRAST TECHNIQUE: Multidetector CT imaging of the abdomen and pelvis was performed using the standard protocol following bolus  administration of intravenous contrast. CONTRAST:  134mL ISOVUE-300 IOPAMIDOL (ISOVUE-300) INJECTION 61% COMPARISON:  December 22, 2016  FINDINGS: Lower chest: 3 mm nodule is identified in the lateral right lower lobe. This is unchanged compared prior CT. There is a small right pleural effusion. The heart size is normal. Hepatobiliary: There are several cysts in the liver unchanged compared prior exam. Gallbladder and biliary tree are normal. Pancreas: Unremarkable. No pancreatic ductal dilatation or surrounding inflammatory changes. Spleen: Normal in size without focal abnormality. Adrenals/Urinary Tract: Adrenal glands are unremarkable. Kidneys are normal, without renal calculi, focal lesion, or hydronephrosis. Bladder is unremarkable. Stomach/Bowel: There is a minimal hiatal hernia. The stomach is otherwise normal. There is no small bowel obstruction. The colon is normal. The appendix is not definitely seen but no inflammation is noted around cecum. Vascular/Lymphatic: Aortic atherosclerosis. No enlarged abdominal or pelvic lymph nodes. Reproductive: Uterus and bilateral adnexa are unremarkable. Other: There is umbilical herniation of mesenteric fat. Musculoskeletal: No acute abnormality is identified. IMPRESSION: No acute abnormality identified in the abdomen pelvis. The appendix is not seen but no inflammation is noted around the cecum to suggest appendicitis. Liver cysts. Electronically Signed   By: Abelardo Diesel M.D.   On: 03/11/2017 14:15    1600:  Pt has tol PO well while in the ED without N/V.  No stooling while in the ED.  VS per baseline. Continues to deny CP/SOB.  Workup reassuring, no clear indication for admission at this time. Tx symptomatically, f/u PMD and GI MD. Dx and testing d/w pt.  Questions answered.  Verb understanding, agreeable to d/c home with outpt f/u.   Final Clinical Impressions(s) / ED Diagnoses   Final diagnoses:  None    New Prescriptions New Prescriptions   No medications  on file     Francine Graven, DO 03/14/17 1634

## 2017-03-11 NOTE — ED Notes (Signed)
Pt tolerating PO food and liquids.

## 2017-03-11 NOTE — ED Notes (Signed)
Delay in urine sample and vitals, Pt not in room.

## 2017-03-11 NOTE — ED Notes (Signed)
Pt aware a urine sample is needed but is unable to provide one at this time.

## 2017-03-11 NOTE — ED Triage Notes (Signed)
Patient c/o right mid and upper abdominal pain that started on 12/08/16 Patient states she has been to the PCP for the same. Patient states the pain has been getting progressively worse. Patient states pain is worse after eating. Patient states the pain radiates to the back.

## 2017-03-22 ENCOUNTER — Telehealth: Payer: Self-pay | Admitting: Urgent Care

## 2017-03-22 NOTE — Telephone Encounter (Signed)
Theresa [name] - Pt went to emergency room and they told her she needed to go to a GI doctor.  She has seen Dr Paulita Fujita before.  Can we make her a referral for this or does she need to come in first?  Please call [phone]

## 2017-03-23 NOTE — Telephone Encounter (Signed)
Theresa [name],please see this message. Thanks.

## 2017-03-24 NOTE — Telephone Encounter (Signed)
She was referred in April.  She should be able to call with abdominal pain and evaluation to her gastroenterologist, as she was seen there prior and recent.

## 2017-03-25 NOTE — Telephone Encounter (Signed)
Called, left vm informing pt. Should C/b if she has any problems scheduling appt with GI

## 2017-03-30 ENCOUNTER — Encounter (HOSPITAL_COMMUNITY): Payer: Self-pay

## 2017-03-30 ENCOUNTER — Ambulatory Visit (HOSPITAL_COMMUNITY)
Admission: RE | Admit: 2017-03-30 | Discharge: 2017-03-30 | Disposition: A | Discharge status: Home / Self Care | Payer: Medicare Other | Source: Ambulatory Visit | Attending: Internal Medicine | Admitting: Internal Medicine

## 2017-03-30 ENCOUNTER — Other Ambulatory Visit (HOSPITAL_BASED_OUTPATIENT_CLINIC_OR_DEPARTMENT_OTHER): Payer: Medicare Other

## 2017-03-30 ENCOUNTER — Telehealth: Payer: Self-pay | Admitting: *Deleted

## 2017-03-30 DIAGNOSIS — J929 Pleural plaque without asbestos: Secondary | ICD-10-CM | POA: Insufficient documentation

## 2017-03-30 DIAGNOSIS — M799 Soft tissue disorder, unspecified: Secondary | ICD-10-CM | POA: Insufficient documentation

## 2017-03-30 DIAGNOSIS — C342 Malignant neoplasm of middle lobe, bronchus or lung: Secondary | ICD-10-CM

## 2017-03-30 DIAGNOSIS — Z85118 Personal history of other malignant neoplasm of bronchus and lung: Secondary | ICD-10-CM | POA: Diagnosis present

## 2017-03-30 DIAGNOSIS — C3481 Malignant neoplasm of overlapping sites of right bronchus and lung: Secondary | ICD-10-CM | POA: Insufficient documentation

## 2017-03-30 DIAGNOSIS — C3491 Malignant neoplasm of unspecified part of right bronchus or lung: Secondary | ICD-10-CM | POA: Diagnosis not present

## 2017-03-30 LAB — CBC WITH DIFFERENTIAL/PLATELET
BASO%: 0.8 % (ref 0.0–2.0)
BASOS ABS: 0 10*3/uL (ref 0.0–0.1)
EOS%: 0.7 % (ref 0.0–7.0)
Eosinophils Absolute: 0 10*3/uL (ref 0.0–0.5)
HCT: 34.1 % — ABNORMAL LOW (ref 34.8–46.6)
HGB: 11.4 g/dL — ABNORMAL LOW (ref 11.6–15.9)
LYMPH%: 21.2 % (ref 14.0–49.7)
MCH: 30.8 pg (ref 25.1–34.0)
MCHC: 33.4 g/dL (ref 31.5–36.0)
MCV: 92.4 fL (ref 79.5–101.0)
MONO#: 0.5 10*3/uL (ref 0.1–0.9)
MONO%: 10.5 % (ref 0.0–14.0)
NEUT#: 3.4 10*3/uL (ref 1.5–6.5)
NEUT%: 66.8 % (ref 38.4–76.8)
Platelets: 268 10*3/uL (ref 145–400)
RBC: 3.7 10*6/uL (ref 3.70–5.45)
RDW: 16.4 % — AB (ref 11.2–14.5)
WBC: 5.1 10*3/uL (ref 3.9–10.3)
lymph#: 1.1 10*3/uL (ref 0.9–3.3)

## 2017-03-30 LAB — COMPREHENSIVE METABOLIC PANEL
ALT: 16 U/L (ref 0–55)
AST: 29 U/L (ref 5–34)
Albumin: 3.8 g/dL (ref 3.5–5.0)
Alkaline Phosphatase: 84 U/L (ref 40–150)
Anion Gap: 13 mEq/L — ABNORMAL HIGH (ref 3–11)
BUN: 8.7 mg/dL (ref 7.0–26.0)
CHLORIDE: 106 meq/L (ref 98–109)
CO2: 25 meq/L (ref 22–29)
Calcium: 10 mg/dL (ref 8.4–10.4)
Creatinine: 0.9 mg/dL (ref 0.6–1.1)
EGFR: 82 mL/min/{1.73_m2} — ABNORMAL LOW (ref >90)
Glucose: 111 mg/dl (ref 70–140)
POTASSIUM: 3.9 meq/L (ref 3.5–5.1)
SODIUM: 143 meq/L (ref 136–145)
Total Bilirubin: 0.63 mg/dL (ref 0.20–1.20)
Total Protein: 7.8 g/dL (ref 6.4–8.3)

## 2017-03-30 MED ORDER — IOPAMIDOL (ISOVUE-300) INJECTION 61%
INTRAVENOUS | Status: AC
  Filled 2017-03-30: qty 75

## 2017-03-30 MED ORDER — IOPAMIDOL (ISOVUE-300) INJECTION 61%
75.0000 mL | Freq: Once | INTRAVENOUS | Status: AC | PRN
  Administered 2017-03-30: 75 mL via INTRAVENOUS

## 2017-03-30 NOTE — Telephone Encounter (Signed)
[facility] called with called report for CT chest as follows: Soft tissue mass within right upper back musculature. Suspicious for lung cancer recurrence.  Recommend percutaneous biopsy.  Consider FDG PET scan to evaluate for distant disease.  Pleural nodular thickening in right lower lobe is indeterminable. Read by Dr. Joylene John.

## 2017-04-02 ENCOUNTER — Other Ambulatory Visit (HOSPITAL_COMMUNITY): Payer: Self-pay | Admitting: Physician Assistant

## 2017-04-02 DIAGNOSIS — R1011 Right upper quadrant pain: Secondary | ICD-10-CM | POA: Diagnosis not present

## 2017-04-02 DIAGNOSIS — B9681 Helicobacter pylori [H. pylori] as the cause of diseases classified elsewhere: Secondary | ICD-10-CM | POA: Diagnosis not present

## 2017-04-02 DIAGNOSIS — K5904 Chronic idiopathic constipation: Secondary | ICD-10-CM | POA: Diagnosis not present

## 2017-04-05 ENCOUNTER — Telehealth: Payer: Self-pay | Admitting: Internal Medicine

## 2017-04-05 ENCOUNTER — Encounter: Payer: Self-pay | Admitting: Internal Medicine

## 2017-04-05 ENCOUNTER — Ambulatory Visit (HOSPITAL_BASED_OUTPATIENT_CLINIC_OR_DEPARTMENT_OTHER): Payer: Medicare Other | Admitting: Internal Medicine

## 2017-04-05 VITALS — BP 129/77 | HR 122 | Temp 97.9°F | Resp 20 | Ht 68.0 in | Wt 230.9 lb

## 2017-04-05 DIAGNOSIS — R918 Other nonspecific abnormal finding of lung field: Secondary | ICD-10-CM

## 2017-04-05 DIAGNOSIS — C342 Malignant neoplasm of middle lobe, bronchus or lung: Secondary | ICD-10-CM

## 2017-04-05 DIAGNOSIS — Z85118 Personal history of other malignant neoplasm of bronchus and lung: Secondary | ICD-10-CM

## 2017-04-05 DIAGNOSIS — C3481 Malignant neoplasm of overlapping sites of right bronchus and lung: Secondary | ICD-10-CM

## 2017-04-05 DIAGNOSIS — R1011 Right upper quadrant pain: Secondary | ICD-10-CM | POA: Diagnosis not present

## 2017-04-05 NOTE — Progress Notes (Signed)
[facility] [phone]   [phone]  OFFICE PROGRESS NOTE  Patient, No Pcp Per No address on file  DIAGNOSIS: Stage IIIA (T2b, N2, M0) non-small cell lung cancer, invasive poorly differentiated squamous cell carcinoma diagnosed in November 2016 and presented with large right hilar mass with collapse of the right middle lobe and extension into the right upper lobe and right lower lobe along the hilum with right paratracheal and subcarinal lymphadenopathy.   PRIOR THERAPY:  1) Concurrent chemoradiation with weekly carboplatin for AUC of 2 and paclitaxel 45 MG/M2. First dose 08/19/2015. Status post 5 cycles. Last cycle was given 10/07/2015 with significant response. 2) Consolidation chemotherapy with carboplatin for AUC of 5 and paclitaxel 175 MG/M2 every 3 weeks with Neulasta support. First dose 11/18/2015. Status post 3 cycles.  CURRENT THERAPY: Observation.  INTERVAL HISTORY: Theresa [name] 56 y.o. female returns to the clinic today for follow-up visit. The patient is feeling fine with no specific complaints. She notes swelling in the upper part of the right back around 6 weeks and she did not pay much attention to it. It was getting worse and she was straining from constipation. She had CT of the abdomen performed recently that showed no concerning findings in the abdomen and the patient underwent evaluation by gastroenterology with upper endoscopy and colonoscopy by Dr. Paulita Fujita that showed few premalignant polyps. She denied having any recent weight loss or night sweats. She denied having any fever or chills. She has no nausea, vomiting or diarrhea. She is here today for evaluation with repeat CT scan of the chest.  MEDICAL HISTORY: Past Medical History:  Diagnosis Date  . Cancer ([place])    5 years ago - cervical cancer  . Encounter for antineoplastic chemotherapy 08/27/2015  . Hypokalemia 09/30/2015  . Non-small cell carcinoma of lung, stage 3 ([place])  08/08/2015  . Paroxysmal atrial flutter ([place]) 09/16/2015  . Pneumonia     ALLERGIES:  has No Known Allergies.  MEDICATIONS:  Current Outpatient Prescriptions  Medication Sig Dispense Refill  . HYDROcodone-acetaminophen (NORCO/VICODIN) 5-325 MG tablet 1 or 2 tabs PO q6 hours prn pain 20 tablet 0  . linaclotide (LINZESS) 145 MCG CAPS capsule Take 145 mcg by mouth daily before breakfast.    . polyethylene glycol (MIRALAX / GLYCOLAX) packet Take 17 g by mouth daily.    Marland Kitchen docusate sodium (COLACE) 50 MG capsule Take 1 capsule (50 mg total) by mouth 2 (two) times daily. (Patient not taking: Reported on 03/11/2017) 60 capsule 0  . ondansetron (ZOFRAN) 4 MG tablet Take 1 tablet (4 mg total) by mouth every 8 (eight) hours as needed for nausea or vomiting. (Patient not taking: Reported on 04/05/2017) 6 tablet 0   No current facility-administered medications for this visit.     SURGICAL HISTORY:  Past Surgical History:  Procedure Laterality Date  . TUBAL LIGATION    . VIDEO BRONCHOSCOPY WITH ENDOBRONCHIAL ULTRASOUND N/A 08/02/2015   Procedure: VIDEO BRONCHOSCOPY WITH ENDOBRONCHIAL ULTRASOUND;  Surgeon: [name], MD;  Location: [facility];  Service: Thoracic;  Laterality: N/A;  . WISDOM TOOTH EXTRACTION      REVIEW OF SYSTEMS:  A comprehensive review of systems was negative except for: Constitutional: positive for fatigue Musculoskeletal: positive for myalgias   PHYSICAL EXAMINATION: General appearance: alert, cooperative and no distress Head: Normocephalic, without obvious abnormality, atraumatic Neck: no adenopathy, no JVD, supple, symmetrical, trachea midline and thyroid not enlarged, symmetric, no tenderness/mass/nodules Lymph nodes: Cervical, supraclavicular,  and axillary nodes normal. Resp: clear to auscultation bilaterally Back: symmetric, no curvature. ROM normal. No CVA tenderness. Cardio: regular rate and rhythm, S1, S2 normal, no murmur, click, rub or gallop GI: soft,  non-tender; bowel sounds normal; no masses,  no organomegaly Extremities: extremities normal, atraumatic, no cyanosis or edema  Examination of the back showed a palpable 10.0 cm mass above the right scapula.  ECOG PERFORMANCE STATUS: 1 - Symptomatic but completely ambulatory  Blood pressure 129/77, pulse (!) 122, temperature 97.9 F (36.6 C), temperature source Oral, resp. rate 20, height 5\' 8"  (1.727 m), weight 230 lb 14.4 oz (104.7 kg), SpO2 100 %.  LABORATORY DATA: Lab Results  Component Value Date   WBC 5.1 03/30/2017   HGB 11.4 (L) 03/30/2017   HCT 34.1 (L) 03/30/2017   MCV 92.4 03/30/2017   PLT 268 03/30/2017      Chemistry      Component Value Date/Time   NA 143 03/30/2017 0835   K 3.9 03/30/2017 0835   CL 104 03/11/2017 1125   CO2 25 03/30/2017 0835   BUN 8.7 03/30/2017 0835   CREATININE 0.9 03/30/2017 0835      Component Value Date/Time   CALCIUM 10.0 03/30/2017 0835   ALKPHOS 84 03/30/2017 0835   AST 29 03/30/2017 0835   ALT 16 03/30/2017 0835   BILITOT 0.63 03/30/2017 0835       RADIOGRAPHIC STUDIES: Dg Chest 2 View  Result Date: 03/11/2017 CLINICAL DATA:  Previous right-sided lung carcinoma volume loss right EXAM: CHEST  2 VIEW COMPARISON:  Chest CT November 26, 2016 FINDINGS: Volume loss with radiation therapy change in the right remains, similar to most recent prior study. Left lung is mildly hyperexpanded but clear. Heart size is within normal limits. There is normal appearing pulmonary vascularity on the left. Pulmonary vascularity is somewhat distorted on the right stable. No adenopathy appreciable. No bone lesions evident. IMPRESSION: Persistent volume loss on the right with radiation therapy change, primarily medially. This appearance is stable compared to chest CT from approximately 3 months prior. Left lung mildly hyperexpanded clear. No new opacity on either side. Stable cardiac silhouette. No adenopathy demonstrable. Electronically Signed   By: Lowella Grip III M.D.   On: 03/11/2017 12:59   Ct Chest W Contrast  Result Date: 03/30/2017 CLINICAL DATA:  Lung cancer. RIGHT upper lobe lung cancer. Post surgical resection EXAM: CT CHEST WITH CONTRAST TECHNIQUE: Multidetector CT imaging of the chest was performed during intravenous contrast administration. CONTRAST:  3mL ISOVUE-300 IOPAMIDOL (ISOVUE-300) INJECTION 61% COMPARISON:  None. FINDINGS: Cardiovascular: Apparent RIGHT subclavian artery. Normal heart size. No pericardial effusion. Mediastinum/Nodes: No axillary or supraclavicular adenopathy. RIGHT peribronchial and perihilar thickening and linear fibrotic changes similar comparison exam. The thickened subcarinal tissue measuring 12 mm also not changed. Lungs/Pleura: Linear perihilar consolidation with bronchiectasis not changed from prior consistent with radiation therapy. No new nodularity. Small peripheral nodule RIGHT lower lobe measuring 4 mm (image 105) is unchanged. Nodular thickening along the pleural surface in the RIGHT lower lobe is increased in image 106, series 5. LEFT lung is clear. Upper Abdomen: Limited view of the liver, kidneys, pancreas are unremarkable. Normal adrenal glands. Musculoskeletal: Mass within the musculature of the RIGHT back measures 8.3 x 4.6 cm (image 93, series 2). No aggressive osseous lesion. IMPRESSION: 1. Soft tissue mass within the RIGHT upper back musculature suspicious for lung cancer recurrence. Recommend percutaneous biopsy. Consider FDG PET scan to evaluate for more distant disease. 2. Pleural nodular thickening in  the RIGHT lower lobe is indeterminate. These results will be called to the ordering clinician or representative by the Radiologist Assistant, and communication documented in the PACS or zVision Dashboard. Electronically Signed   By: Suzy Bouchard M.D.   On: 03/30/2017 14:27   Ct Abdomen Pelvis W Contrast  Result Date: 03/11/2017 CLINICAL DATA:  Right abdomen pain for 3 months EXAM: CT ABDOMEN  AND PELVIS WITH CONTRAST TECHNIQUE: Multidetector CT imaging of the abdomen and pelvis was performed using the standard protocol following bolus administration of intravenous contrast. CONTRAST:  151mL ISOVUE-300 IOPAMIDOL (ISOVUE-300) INJECTION 61% COMPARISON:  December 22, 2016 FINDINGS: Lower chest: 3 mm nodule is identified in the lateral right lower lobe. This is unchanged compared prior CT. There is a small right pleural effusion. The heart size is normal. Hepatobiliary: There are several cysts in the liver unchanged compared prior exam. Gallbladder and biliary tree are normal. Pancreas: Unremarkable. No pancreatic ductal dilatation or surrounding inflammatory changes. Spleen: Normal in size without focal abnormality. Adrenals/Urinary Tract: Adrenal glands are unremarkable. Kidneys are normal, without renal calculi, focal lesion, or hydronephrosis. Bladder is unremarkable. Stomach/Bowel: There is a minimal hiatal hernia. The stomach is otherwise normal. There is no small bowel obstruction. The colon is normal. The appendix is not definitely seen but no inflammation is noted around cecum. Vascular/Lymphatic: Aortic atherosclerosis. No enlarged abdominal or pelvic lymph nodes. Reproductive: Uterus and bilateral adnexa are unremarkable. Other: There is umbilical herniation of mesenteric fat. Musculoskeletal: No acute abnormality is identified. IMPRESSION: No acute abnormality identified in the abdomen pelvis. The appendix is not seen but no inflammation is noted around the cecum to suggest appendicitis. Liver cysts. Electronically Signed   By: Abelardo Diesel M.D.   On: 03/11/2017 14:15    ASSESSMENT AND PLAN:  This is a very pleasant 56 years old African-American female with a stage IIIa non-small cell lung cancer, squamous cell carcinoma presented with large right hilar mass and mediastinal lymphadenopathy diagnosed in November 2016. She status post concurrent chemoradiation followed by consolidation  chemotherapy. The patient is currently on observation and the recent CT scan of the chest showed no evidence for recurrence in the chest but there was a new soft tissue mass within the musculature of the right upper back concerning for new malignancy. I discussed the scan results with the patient today. I recommended for her to have CT-guided core biopsy by interventional radiology for further evaluation of this lesion. If the mass is concerning for malignancy, I will order a PET scan for further staging of her disease. I will see her back for follow-up visit in 3 weeks for reevaluation and discussion of her treatment options based on the final pathology. She was advised to call immediately if she has any concerning symptoms in the interval. The patient voices understanding of current disease status and treatment options and is in agreement with the current care plan.  All questions were answered. The patient knows to call the clinic with any problems, questions or concerns. We can certainly see the patient much sooner if necessary. I spent 10 minutes counseling the patient face to face. The total time spent in the appointment was 15 minutes.  Disclaimer: This note was dictated with voice recognition software. Similar sounding words can inadvertently be transcribed and may not be corrected upon review.

## 2017-04-05 NOTE — Telephone Encounter (Signed)
Gave patient avs report and appointments for August. [facility] radiology will call re bx. No 3 week availability.

## 2017-04-08 ENCOUNTER — Other Ambulatory Visit: Payer: Self-pay | Admitting: Medical Oncology

## 2017-04-08 ENCOUNTER — Telehealth: Payer: Self-pay | Admitting: Medical Oncology

## 2017-04-08 DIAGNOSIS — M7989 Other specified soft tissue disorders: Secondary | ICD-10-CM

## 2017-04-08 MED ORDER — HYDROCODONE-ACETAMINOPHEN 5-325 MG PO TABS: ORAL_TABLET | ORAL | 0 refills | Status: DC

## 2017-04-08 NOTE — Telephone Encounter (Signed)
Pain at knot in back . Uncomfortable to lay on back . Requesting something for pain. Completed  Hydrocodone from 6/21.

## 2017-04-13 DIAGNOSIS — D649 Anemia, unspecified: Secondary | ICD-10-CM | POA: Diagnosis not present

## 2017-04-13 DIAGNOSIS — R6889 Other general symptoms and signs: Secondary | ICD-10-CM | POA: Diagnosis not present

## 2017-04-13 DIAGNOSIS — R1011 Right upper quadrant pain: Secondary | ICD-10-CM | POA: Diagnosis not present

## 2017-04-14 ENCOUNTER — Other Ambulatory Visit: Payer: Self-pay | Admitting: Physician Assistant

## 2017-04-14 ENCOUNTER — Ambulatory Visit (HOSPITAL_COMMUNITY)
Admission: RE | Admit: 2017-04-14 | Discharge: 2017-04-14 | Disposition: A | Discharge status: Home / Self Care | Payer: Medicare Other | Source: Ambulatory Visit | Attending: Physician Assistant | Admitting: Physician Assistant

## 2017-04-14 ENCOUNTER — Other Ambulatory Visit: Payer: Self-pay | Admitting: General Surgery

## 2017-04-14 DIAGNOSIS — R1011 Right upper quadrant pain: Secondary | ICD-10-CM | POA: Insufficient documentation

## 2017-04-14 MED ORDER — TECHNETIUM TC 99M MEBROFENIN IV KIT
5.0000 | PACK | Freq: Once | INTRAVENOUS | Status: AC | PRN
  Administered 2017-04-14: 5 via INTRAVENOUS

## 2017-04-15 ENCOUNTER — Ambulatory Visit (HOSPITAL_COMMUNITY)
Admission: RE | Admit: 2017-04-15 | Discharge: 2017-04-15 | Disposition: A | Discharge status: Home / Self Care | Payer: Medicare Other | Source: Ambulatory Visit | Attending: Internal Medicine | Admitting: Internal Medicine

## 2017-04-15 ENCOUNTER — Encounter (HOSPITAL_COMMUNITY): Payer: Self-pay

## 2017-04-15 DIAGNOSIS — C342 Malignant neoplasm of middle lobe, bronchus or lung: Secondary | ICD-10-CM | POA: Insufficient documentation

## 2017-04-15 DIAGNOSIS — Z9221 Personal history of antineoplastic chemotherapy: Secondary | ICD-10-CM | POA: Diagnosis not present

## 2017-04-15 DIAGNOSIS — D4989 Neoplasm of unspecified behavior of other specified sites: Secondary | ICD-10-CM | POA: Diagnosis not present

## 2017-04-15 DIAGNOSIS — C7989 Secondary malignant neoplasm of other specified sites: Secondary | ICD-10-CM | POA: Diagnosis not present

## 2017-04-15 DIAGNOSIS — C44529 Squamous cell carcinoma of skin of other part of trunk: Secondary | ICD-10-CM | POA: Diagnosis not present

## 2017-04-15 DIAGNOSIS — Z87891 Personal history of nicotine dependence: Secondary | ICD-10-CM | POA: Diagnosis not present

## 2017-04-15 DIAGNOSIS — Z923 Personal history of irradiation: Secondary | ICD-10-CM | POA: Diagnosis not present

## 2017-04-15 DIAGNOSIS — C3481 Malignant neoplasm of overlapping sites of right bronchus and lung: Secondary | ICD-10-CM | POA: Diagnosis not present

## 2017-04-15 DIAGNOSIS — R222 Localized swelling, mass and lump, trunk: Secondary | ICD-10-CM | POA: Diagnosis not present

## 2017-04-15 LAB — APTT: APTT: 32 s (ref 24–36)

## 2017-04-15 LAB — CBC
HCT: 32.5 % — ABNORMAL LOW (ref 36.0–46.0)
HEMOGLOBIN: 10.9 g/dL — AB (ref 12.0–15.0)
MCH: 29.8 pg (ref 26.0–34.0)
MCHC: 33.5 g/dL (ref 30.0–36.0)
MCV: 88.8 fL (ref 78.0–100.0)
Platelets: 318 10*3/uL (ref 150–400)
RBC: 3.66 MIL/uL — ABNORMAL LOW (ref 3.87–5.11)
RDW: 15.4 % (ref 11.5–15.5)
WBC: 6 10*3/uL (ref 4.0–10.5)

## 2017-04-15 LAB — PROTIME-INR
INR: 1.08
PROTHROMBIN TIME: 14 s (ref 11.4–15.2)

## 2017-04-15 MED ORDER — SODIUM CHLORIDE 0.9 % IV SOLN
INTRAVENOUS | Status: AC | PRN
  Administered 2017-04-15: 10 mL/h via INTRAVENOUS

## 2017-04-15 MED ORDER — FENTANYL CITRATE (PF) 100 MCG/2ML IJ SOLN
INTRAMUSCULAR | Status: AC
  Filled 2017-04-15: qty 2

## 2017-04-15 MED ORDER — MIDAZOLAM HCL 2 MG/2ML IJ SOLN
INTRAMUSCULAR | Status: AC
  Filled 2017-04-15: qty 2

## 2017-04-15 MED ORDER — SODIUM CHLORIDE 0.9 % IV SOLN: INTRAVENOUS | Status: DC

## 2017-04-15 MED ORDER — HYDROCODONE-ACETAMINOPHEN 5-325 MG PO TABS: 1.0000 | ORAL_TABLET | ORAL | Status: DC | PRN

## 2017-04-15 MED ORDER — LIDOCAINE HCL (PF) 1 % IJ SOLN
INTRAMUSCULAR | Status: AC
  Filled 2017-04-15: qty 10

## 2017-04-15 MED ORDER — FENTANYL CITRATE (PF) 100 MCG/2ML IJ SOLN
INTRAMUSCULAR | Status: AC | PRN
  Administered 2017-04-15 (×2): 25 ug via INTRAVENOUS
  Administered 2017-04-15: 50 ug via INTRAVENOUS

## 2017-04-15 MED ORDER — MIDAZOLAM HCL 2 MG/2ML IJ SOLN
INTRAMUSCULAR | Status: AC | PRN
  Administered 2017-04-15: 0.5 mg via INTRAVENOUS
  Administered 2017-04-15: 1 mg via INTRAVENOUS
  Administered 2017-04-15: 0.5 mg via INTRAVENOUS

## 2017-04-15 NOTE — Procedures (Signed)
  Procedure:   US biopsy RIGHT back mass 18g x3 to surg path Preprocedure diagnosis:  Lung CA met Postprocedure diagnosis:  same EBL:     minimal Complications:   none immediate  See full dictation in [employer].  Dillard Cannon MD Main # [phone] Pager  [phone]

## 2017-04-15 NOTE — Discharge Instructions (Addendum)
Biopsy Discharge Instructions ° °The procedure you just had is called a biopsy.  You may feel some discomfort after the local anesthetic wears off.  Your discomfort should improve over the next several days. ° °AFTER YOUR BIOPSY °· Rest for the remainder of the day. °· Avoid heavy lifting (more than 10 lb/4.5 kg). °· If you have been given a general anesthetic or other medications to help you relax, you should not operate machinery, drive or make legal decisions for 24 hours after your procedure.  Additionally, someone must be available to drive you home. °· Only take over-the-counter or prescription medicines for pain, discomfort, or fever as directed by your caregiver.  This can make bleeding worse. °· You may resume your usual diet after the procedure. °· Avoid alcoholic beverages for 24 hours after your procedure. °· Keep the skin around your biopsy site clean and dry. °· You may shower after 24 hours.  Cleanse and dry the biopsy site completely after you shower.  Avoid baths and swimming for 72 hours. ° °Complications are very uncommon after this procedure.  Go to the nearest Emergency Department or contact your caregiver if you develop any of the following symptoms: °· Worsening pain °· Bleeding °· Swelling at the biopsy site °· Light headedness or dizziness °· Shortness of Breath °· Fever or chills °Redness or increased pain or swelling at the biopsy site   Moderate Conscious Sedation, Adult, Care After °These instructions provide you with information about caring for yourself after your procedure. Your health care provider may also give you more specific instructions. Your treatment has been planned according to current medical practices, but problems sometimes occur. Call your health care provider if you have any problems or questions after your procedure. °What can I expect after the procedure? °After your procedure, it is common: °· To feel sleepy for several hours. °· To feel clumsy and have poor balance  for several hours. °· To have poor judgment for several hours. °· To vomit if you eat too soon. ° °Follow these instructions at home: °For at least 24 hours after the procedure: ° °· Do not: °? Participate in activities where you could fall or become injured. °? Drive. °? Use heavy machinery. °? Drink alcohol. °? Take sleeping pills or medicines that cause drowsiness. °? Make important decisions or sign legal documents. °? Take care of children on your own. °· Rest. °Eating and drinking °· Follow the diet recommended by your health care provider. °· If you vomit: °? Drink water, juice, or soup when you can drink without vomiting. °? Make sure you have little or no nausea before eating solid foods. °General instructions °· Have a responsible adult stay with you until you are awake and alert. °· Take over-the-counter and prescription medicines only as told by your health care provider. °· If you smoke, do not smoke without supervision. °· Keep all follow-up visits as told by your health care provider. This is important. °Contact a health care provider if: °· You keep feeling nauseous or you keep vomiting. °· You feel light-headed. °· You develop a rash. °· You have a fever. °Get help right away if: °· You have trouble breathing. °This information is not intended to replace advice given to you by your health care provider. Make sure you discuss any questions you have with your health care provider. °Document Released: 06/28/2013 Document Revised: 02/10/2016 Document Reviewed: 12/28/2015 °Elsevier Interactive Patient Education © 2018 [employer] ° °

## 2017-04-15 NOTE — H&P (Signed)
Chief Complaint: back lesion, history of lung cancer  Referring Physician:Dr. Curt Bears  Supervising Physician: Arne Cleveland  Patient Status: [facility] - Out-pt  HPI: Theresa [name] is a 56 y.o. female with a history of lung cancer, s/p chemoradiation therapy.  She was diagnosed in 2016 and completed treatment in early 2017.  She has been followed by Dr. Julien Nordmann with clean scans until she saw him recently complaining of a mass on her right back.  She had a scan that confirmed this new lesion and felt it may be a recurrence of her lung cancer.  A request for a biopsy has been made to determine etiology of this lesion.  She has been having some RUQ abdominal pain of uncertain etiology.  She states her gallbladder has been ruled out for now.  She denies any other symptoms.  Past Medical History:  Past Medical History:  Diagnosis Date  . Cancer ([place])    5 years ago - cervical cancer  . Encounter for antineoplastic chemotherapy 08/27/2015  . Hypokalemia 09/30/2015  . Non-small cell carcinoma of lung, stage 3 ([place]) 08/08/2015  . Paroxysmal atrial flutter ([place]) 09/16/2015  . Pneumonia     Past Surgical History:  Past Surgical History:  Procedure Laterality Date  . TUBAL LIGATION    . VIDEO BRONCHOSCOPY WITH ENDOBRONCHIAL ULTRASOUND N/A 08/02/2015   Procedure: VIDEO BRONCHOSCOPY WITH ENDOBRONCHIAL ULTRASOUND;  Surgeon: [name], MD;  Location: [facility] OR;  Service: Thoracic;  Laterality: N/A;  . WISDOM TOOTH EXTRACTION      Family History:  Family History  Problem Relation Age of Onset  . COPD Mother   . Diabetes type II Mother   . Heart disease Mother   . High Cholesterol Mother   . Lung cancer Father     Social History:  reports that she quit smoking about 26 years ago. Her smoking use included Cigarettes. She has a 1.50 pack-year smoking history. She has never used smokeless tobacco. She reports that she does not drink alcohol or use drugs.  Allergies: No Known  Allergies  Medications: Medications reviewed in epic  Please HPI for pertinent positives, otherwise complete 10 system ROS negative.  Mallampati Score: MD Evaluation Airway: WNL Heart: WNL Abdomen: WNL Chest/ Lungs: WNL ASA  Classification: 2 Mallampati/Airway Score: One  Physical Exam: BP 125/81   Pulse 89   Temp 97.7 F (36.5 C) (Oral)   Resp 18   Ht 5\' 8"  (1.727 m)   Wt 230 lb (104.3 kg)   SpO2 99%   BMI 34.97 kg/m  Body mass index is 34.97 kg/m. General: pleasant, WD, WN black female who is laying in bed in NAD HEENT: head is normocephalic, atraumatic.  Sclera are noninjected.  PERRL.  Ears and nose without any masses or lesions.  Mouth is pink and moist Heart: regular rhythm, but tachycardic.  Normal s1,s2. No obvious murmurs, gallops, or rubs noted.  Palpable radial pulses bilaterally Lungs: CTAB, no wheezes, rhonchi, or rales noted.  Respiratory effort nonlabored Abd: soft, NT, ND, +BS, no masses, hernias, or organomegaly Psych: A&Ox3 with an appropriate affect.   Labs: Results for orders placed or performed during the hospital encounter of 04/15/17 (from the past 48 hour(s))  APTT upon arrival     Status: None   Collection Time: 04/15/17  6:01 AM  Result Value Ref Range   aPTT 32 24 - 36 seconds  CBC upon arrival     Status: Abnormal   Collection Time: 04/15/17  6:01 AM  Result Value Ref Range   WBC 6.0 4.0 - 10.5 K/uL   RBC 3.66 (L) 3.87 - 5.11 MIL/uL   Hemoglobin 10.9 (L) 12.0 - 15.0 g/dL   HCT 32.5 (L) 36.0 - 46.0 %   MCV 88.8 78.0 - 100.0 fL   MCH 29.8 26.0 - 34.0 pg   MCHC 33.5 30.0 - 36.0 g/dL   RDW 15.4 11.5 - 15.5 %   Platelets 318 150 - 400 K/uL  Protime-INR upon arrival     Status: None   Collection Time: 04/15/17  6:01 AM  Result Value Ref Range   Prothrombin Time 14.0 11.4 - 15.2 seconds   INR 1.08     Imaging: Nm Hepato W/eject Fract  Result Date: 04/14/2017 CLINICAL DATA:  56 year old female with right upper quadrant abdominal pain  and nausea/vomiting. EXAM: NUCLEAR MEDICINE HEPATOBILIARY IMAGING WITH GALLBLADDER EF TECHNIQUE: Sequential images of the abdomen were obtained out to 60 minutes following intravenous administration of radiopharmaceutical. After oral ingestion of 8 ounces of Ensure, gallbladder ejection fraction was determined. RADIOPHARMACEUTICALS:  5.0 mCi Tc-36m Choletec IV COMPARISON:  12/22/2016 ultrasound FINDINGS: Prompt uptake and biliary excretion of activity by the liver is seen. Gallbladder activity is visualized, consistent with patency of cystic duct. Biliary activity passes into small bowel, consistent with patent common bile duct. Calculated gallbladder ejection fraction is 58%. (Normal gallbladder ejection fraction with Ensure is greater than 33%.) IMPRESSION: Normal hepatobiliary study with normal gallbladder ejection fraction. Electronically Signed   By: Margarette Canada M.D.   On: 04/14/2017 15:43    Assessment/Plan 1. Back lesion, history of non small cell lung cancer  We will plan to proceed with a biopsy of this lesion today.  Her labs and vitals have been reviewed.  Risks and Benefits discussed with the patient including, but not limited to bleeding, infection, damage to adjacent structures or low yield requiring additional tests. All of the patient's questions were answered, patient is agreeable to proceed. Consent signed and in chart.  Thank you for this interesting consult.  I greatly enjoyed meeting Theresa [name] and look forward to participating in their care.  A copy of this report was sent to the requesting provider on this date.  Electronically Signed: Henreitta Cea 04/15/2017, 7:55 AM   I spent a total of  30 Minutes   in face to face in clinical consultation, greater than 50% of which was counseling/coordinating care for back lesion, history of lung cancer

## 2017-04-19 ENCOUNTER — Other Ambulatory Visit: Payer: Self-pay

## 2017-04-19 ENCOUNTER — Emergency Department (HOSPITAL_COMMUNITY): Payer: Medicare Other

## 2017-04-19 ENCOUNTER — Other Ambulatory Visit: Payer: Self-pay | Admitting: Internal Medicine

## 2017-04-19 ENCOUNTER — Emergency Department (HOSPITAL_COMMUNITY)
Admission: EM | Admit: 2017-04-19 | Discharge: 2017-04-19 | Disposition: A | Discharge status: Home / Self Care | Payer: Medicare Other | Attending: Emergency Medicine | Admitting: Emergency Medicine

## 2017-04-19 ENCOUNTER — Telehealth: Payer: Self-pay

## 2017-04-19 ENCOUNTER — Encounter (HOSPITAL_COMMUNITY): Payer: Self-pay | Admitting: Emergency Medicine

## 2017-04-19 DIAGNOSIS — Z79899 Other long term (current) drug therapy: Secondary | ICD-10-CM | POA: Diagnosis not present

## 2017-04-19 DIAGNOSIS — Z87891 Personal history of nicotine dependence: Secondary | ICD-10-CM | POA: Diagnosis not present

## 2017-04-19 DIAGNOSIS — M549 Dorsalgia, unspecified: Secondary | ICD-10-CM | POA: Diagnosis present

## 2017-04-19 DIAGNOSIS — R1084 Generalized abdominal pain: Secondary | ICD-10-CM | POA: Insufficient documentation

## 2017-04-19 DIAGNOSIS — R079 Chest pain, unspecified: Secondary | ICD-10-CM | POA: Diagnosis not present

## 2017-04-19 DIAGNOSIS — R000 Tachycardia, unspecified: Secondary | ICD-10-CM | POA: Diagnosis not present

## 2017-04-19 DIAGNOSIS — C342 Malignant neoplasm of middle lobe, bronchus or lung: Secondary | ICD-10-CM

## 2017-04-19 DIAGNOSIS — Z8541 Personal history of malignant neoplasm of cervix uteri: Secondary | ICD-10-CM | POA: Diagnosis not present

## 2017-04-19 DIAGNOSIS — R222 Localized swelling, mass and lump, trunk: Secondary | ICD-10-CM | POA: Insufficient documentation

## 2017-04-19 DIAGNOSIS — Z85118 Personal history of other malignant neoplasm of bronchus and lung: Secondary | ICD-10-CM | POA: Diagnosis not present

## 2017-04-19 DIAGNOSIS — R229 Localized swelling, mass and lump, unspecified: Secondary | ICD-10-CM | POA: Diagnosis not present

## 2017-04-19 LAB — COMPREHENSIVE METABOLIC PANEL
ALBUMIN: 4 g/dL (ref 3.5–5.0)
ALK PHOS: 75 U/L (ref 38–126)
ALT: 20 U/L (ref 14–54)
AST: 34 U/L (ref 15–41)
Anion gap: 11 (ref 5–15)
BUN: 8 mg/dL (ref 6–20)
CHLORIDE: 99 mmol/L — AB (ref 101–111)
CO2: 24 mmol/L (ref 22–32)
CREATININE: 0.81 mg/dL (ref 0.44–1.00)
Calcium: 9.3 mg/dL (ref 8.9–10.3)
GFR calc non Af Amer: >60 mL/min (ref >60)
GLUCOSE: 125 mg/dL — AB (ref 65–99)
Potassium: 3.6 mmol/L (ref 3.5–5.1)
Sodium: 134 mmol/L — ABNORMAL LOW (ref 135–145)
Total Bilirubin: 0.7 mg/dL (ref 0.3–1.2)
Total Protein: 8.4 g/dL — ABNORMAL HIGH (ref 6.5–8.1)

## 2017-04-19 LAB — CBC WITH DIFFERENTIAL/PLATELET
BASOS PCT: 0 %
Basophils Absolute: 0 10*3/uL (ref 0.0–0.1)
EOS ABS: 0 10*3/uL (ref 0.0–0.7)
Eosinophils Relative: 0 %
HEMATOCRIT: 33.2 % — AB (ref 36.0–46.0)
Hemoglobin: 11.6 g/dL — ABNORMAL LOW (ref 12.0–15.0)
LYMPHS ABS: 1.4 10*3/uL (ref 0.7–4.0)
Lymphocytes Relative: 17 %
MCH: 30.6 pg (ref 26.0–34.0)
MCHC: 34.9 g/dL (ref 30.0–36.0)
MCV: 87.6 fL (ref 78.0–100.0)
MONO ABS: 0.6 10*3/uL (ref 0.1–1.0)
MONOS PCT: 7 %
NEUTROS ABS: 6.3 10*3/uL (ref 1.7–7.7)
Neutrophils Relative %: 76 %
Platelets: 327 10*3/uL (ref 150–400)
RBC: 3.79 MIL/uL — ABNORMAL LOW (ref 3.87–5.11)
RDW: 15.1 % (ref 11.5–15.5)
WBC: 8.2 10*3/uL (ref 4.0–10.5)

## 2017-04-19 MED ORDER — SODIUM CHLORIDE 0.9 % IV BOLUS (SEPSIS)
1000.0000 mL | Freq: Once | INTRAVENOUS | Status: AC
  Administered 2017-04-19: 1000 mL via INTRAVENOUS

## 2017-04-19 MED ORDER — IOPAMIDOL (ISOVUE-370) INJECTION 76%
100.0000 mL | Freq: Once | INTRAVENOUS | Status: AC | PRN
  Administered 2017-04-19: 81 mL via INTRAVENOUS

## 2017-04-19 MED ORDER — HYDROMORPHONE HCL 1 MG/ML IJ SOLN
0.5000 mg | Freq: Once | INTRAMUSCULAR | Status: AC
  Administered 2017-04-19: 0.5 mg via INTRAVENOUS
  Filled 2017-04-19: qty 1

## 2017-04-19 MED ORDER — IOPAMIDOL (ISOVUE-370) INJECTION 76%
INTRAVENOUS | Status: AC
  Filled 2017-04-19: qty 100

## 2017-04-19 MED ORDER — OXYCODONE-ACETAMINOPHEN 5-325 MG PO TABS: 1.0000 | ORAL_TABLET | ORAL | 0 refills | Status: DC | PRN

## 2017-04-19 MED ORDER — HYDROMORPHONE HCL 1 MG/ML IJ SOLN
0.5000 mg | INTRAMUSCULAR | Status: AC
  Administered 2017-04-19 (×2): 0.5 mg via INTRAVENOUS
  Filled 2017-04-19 (×2): qty 1

## 2017-04-19 MED ORDER — ONDANSETRON HCL 4 MG/2ML IJ SOLN
4.0000 mg | Freq: Once | INTRAMUSCULAR | Status: AC
  Administered 2017-04-19: 4 mg via INTRAVENOUS
  Filled 2017-04-19: qty 2

## 2017-04-19 NOTE — ED Notes (Signed)
This nurse attempted x2 for PIV stick. Unable to establish PIV. IV team consulted. Due to ordered tests, 20G is required.

## 2017-04-19 NOTE — ED Provider Notes (Signed)
[name] DEPT Provider Note   CSN: [id number] Arrival date & time: 04/19/17  0904     History   Chief Complaint Chief Complaint  Patient presents with  . generalized pain    HPI Theresa [name] is a 56 y.o. female.  HPI Theresa [name] is a 55 y.o. female with history of cervical cancer, lung cancer, non-small cell carcinoma, atrial flutter, presents to emergency department complaining of right-sided abdominal and right back pain. Patient states she has been having pain in that side for the last 4 months. She states she has had multiple CT scans of both abdomen and pelvis, as well as had an ultrasound of her abdomen and hepatobiliary ejection scan just 5 days ago. She states everything came back normal. The last CT scan that was obtained by her oncologist on the 03/30/17, showed a new mass in her back musculature, this was concerning for recurrent cancer and patient had a biopsy performed 4 days ago. She does not know results of the biopsy. She states the pain is not controlled with her home hydrocodone at this time. Patient is tearful. She denies any nausea or vomiting. She reports constipation. No blood in her stool. Denies any fever or chills. Reports some shortness of breath.  Past Medical History:  Diagnosis Date  . Cancer ([place])    5 years ago - cervical cancer  . Encounter for antineoplastic chemotherapy 08/27/2015  . Hypokalemia 09/30/2015  . Non-small cell carcinoma of lung, stage 3 ([place]) 08/08/2015  . Paroxysmal atrial flutter ([place]) 09/16/2015  . Pneumonia     Patient Active Problem List   Diagnosis Date Noted  . Dehydration 10/11/2015  . Nausea with vomiting 10/11/2015  . Chemotherapy induced neutropenia ([place]) 09/30/2015  . Hypokalemia 09/30/2015  . 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 Antineoplastic chemotherapy induced pancytopenia ([place]) 09/18/2015  . Constipation 09/18/2015  . Intractable nausea and vomiting 09/18/2015  . Typical atrial flutter ([place])    . Malignant neoplasm of overlapping sites of right lung ([place])   . Malnutrition of moderate degree 09/17/2015  . Chest pain 09/16/2015  . Tachycardia 09/16/2015  . Pain in the chest   . Encounter for antineoplastic chemotherapy 08/27/2015  . Non-small cell carcinoma of lung, stage 3 ([place]) 08/08/2015  . Lung mass 07/16/2015  . Malignant neoplasm of cervix ([place]) 10/08/2011    Past Surgical History:  Procedure Laterality Date  . TUBAL LIGATION    . VIDEO BRONCHOSCOPY WITH ENDOBRONCHIAL ULTRASOUND N/A 08/02/2015   Procedure: VIDEO BRONCHOSCOPY WITH ENDOBRONCHIAL ULTRASOUND;  Surgeon: [name], MD;  Location: [facility];  Service: Thoracic;  Laterality: N/A;  . WISDOM TOOTH EXTRACTION      OB History    No data available       Home Medications    Prior to Admission medications   Medication Sig Start Date End Date Taking? Authorizing Provider  HYDROcodone-acetaminophen (NORCO/VICODIN) 5-325 MG tablet 1 or 2 tabs PO q6 hours prn pain Patient taking differently: Take 1-2 tablets by mouth every 6 (six) hours as needed (pain).  04/08/17  Yes Curt Bears, MD  linaclotide [facility]) 145 MCG CAPS capsule Take 145 mcg by mouth daily before breakfast.   Yes [provider]  magnesium hydroxide (MILK OF MAGNESIA) 400 MG/5ML suspension Take 5 mLs by mouth at bedtime as needed for mild constipation.   Yes [provider]  docusate sodium (COLACE) 50 MG capsule Take 1 capsule (50 mg total) by mouth 2 (two) times daily. Patient  not taking: Reported on 03/11/2017 12/26/16   [name], PA-C  ondansetron (ZOFRAN) 4 MG tablet Take 1 tablet (4 mg total) by mouth every 8 (eight) hours as needed for nausea or vomiting. Patient not taking: Reported on 04/05/2017 03/11/17   Francine Graven, DO    Family History Family History  Problem Relation Age of Onset  . COPD Mother   . Diabetes type II Mother   . Heart disease Mother   . High Cholesterol Mother   . Lung cancer  Father     Social History Social History  Substance Use Topics  . Smoking status: Former Smoker    Packs/day: 0.30    Years: 5.00    Types: Cigarettes    Quit date: 07/31/1990  . Smokeless tobacco: Never Used  . Alcohol use No     Allergies   Patient has no known allergies.   Review of Systems Review of Systems  Constitutional: Negative for chills and fever.  Respiratory: Positive for shortness of breath. Negative for cough and chest tightness.   Cardiovascular: Negative for chest pain, palpitations and leg swelling.  Gastrointestinal: Positive for abdominal pain. Negative for diarrhea, nausea and vomiting.  Genitourinary: Negative for dysuria, flank pain and pelvic pain.  Musculoskeletal: Positive for back pain. Negative for arthralgias, myalgias, neck pain and neck stiffness.  Skin: Negative for rash.  Neurological: Negative for dizziness, weakness and headaches.  All other systems reviewed and are negative.    Physical Exam Updated Vital Signs BP (!) 153/121 (BP Location: Left Arm)   Pulse (!) 121 Comment: patient crying   Temp 98 F (36.7 C) (Oral)   Resp 20   SpO2 100%   Physical Exam  Constitutional: She appears well-developed and well-nourished. No distress.  HENT:  Head: Normocephalic.  Eyes: Conjunctivae are normal.  Neck: Neck supple.  Cardiovascular: Normal rate, regular rhythm and normal heart sounds.   Pulmonary/Chest: Effort normal and breath sounds normal. No respiratory distress. She has no wheezes. She has no rales.  Large irregular solid mass to the right upper back, just below scapular area. TTP. No skin changes.   Abdominal: Soft. Bowel sounds are normal. She exhibits no distension. There is no tenderness. There is no rebound.  Musculoskeletal: She exhibits no edema.  Neurological: She is alert.  Skin: Skin is warm and dry.  Psychiatric: She has a normal mood and affect. Her behavior is normal.  Nursing note and vitals reviewed.    ED  Treatments / Results  Labs (all labs ordered are listed, but only abnormal results are displayed) Labs Reviewed  CBC WITH DIFFERENTIAL/PLATELET - Abnormal; Notable for the following:       Result Value   RBC 3.79 (*)    Hemoglobin 11.6 (*)    HCT 33.2 (*)    All other components within normal limits  COMPREHENSIVE METABOLIC PANEL - Abnormal; Notable for the following:    Sodium 134 (*)    Chloride 99 (*)    Glucose, Bld 125 (*)    Total Protein 8.4 (*)    All other components within normal limits    EKG  EKG Interpretation  Date/Time:  Monday April 19 2017 15:35:45 EDT Ventricular Rate:  121 PR Interval:    QRS Duration: 102 QT Interval:  464 QTC Calculation: 659 R Axis:   13 Text Interpretation:  Sinus tachycardia RSR' in V1 or V2, probably normal variant Prolonged QT interval QT prolongation appears longer compared to June 2018 Confirmed by Sherwood Gambler (  [id number]) on 04/19/2017 3:56:16 PM       Radiology Ct Angio Chest Pe W And/or Wo Contrast  Result Date: 04/19/2017 CLINICAL DATA:  Chest pain, status post chemotherapy and radiation for lung cancer, status post biopsy of right upper back mass demonstrating squamous cell carcinoma EXAM: CT ANGIOGRAPHY CHEST WITH CONTRAST TECHNIQUE: Multidetector CT imaging of the chest was performed using the standard protocol during bolus administration of intravenous contrast. Multiplanar CT image reconstructions and MIPs were obtained to evaluate the vascular anatomy. CONTRAST:  81 mL Isovue 370 IV COMPARISON:  CT chest dated 03/30/2017 FINDINGS: Cardiovascular: Satisfactory opacification of the bilateral pulmonary arteries to the lobar level. No evidence of pulmonary embolism. No evidence of thoracic aortic aneurysm or dissection. The heart is normal in size.  No pericardial effusion. Mediastinum/Nodes: 13 mm short axis subcarinal node (series 6/ image 35). No suspicious hilar or axillary lymphadenopathy. Visualized thyroid is unremarkable.  Lungs/Pleura: Radiation changes in the right upper lobe and paramediastinal region. Subpleural nodularity in the posterior right lower lobe (series 13/image 9), reflecting tumor extension. Left lung is clear. No pneumothorax. Upper Abdomen: 11 mm probable cyst in the anterior right hepatic dome (series 6/image 59). 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 Additional tumor along the right posterior chest wall/ pleural space at the 9th and 10th rib interspace (series 6/ image 61), with associated osseous involvement. Review of the MIP images confirms the above findings. IMPRESSION: No evidence pulmonary embolism. Radiation changes in the right lung. 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 Additional tumor along the right posterior chest wall/pleural space at the 9th and 10th rib interspace. Associated osseous involvement. Electronically Signed   By: Julian Hy M.D.   On: 04/19/2017 14:28    Procedures Procedures (including critical care time)  Medications Ordered in ED Medications  HYDROmorphone (DILAUDID) injection 0.5 mg (not administered)  sodium chloride 0.9 % bolus 1,000 mL (not administered)  ondansetron (ZOFRAN) injection 4 mg (not administered)     Initial Impression / Assessment and Plan / ED Course  I have reviewed the triage vital signs and the nursing notes.  Pertinent labs & imaging results that were available during my care of the patient were reviewed by me and considered in my medical decision making (see chart for details).     Patient with persistent right back and right lower rib/anterior chest pain over last several months, worsening. Recent CT scan showed a mass which was biopsied a few days ago. Patient's tachycardia, continues to have this right rib/chest pain. Will get repeat labs and CT  angiogram to rule out a PE.   The patient's head CT is negative other than persistent mass which appears to be extending into the muscle and the rib. I suspect this could be the cause of patient's pain. Her recent biopsy pathology showed squamous cell carcinoma. I discussed results with Dr. Earlie Server who advised to have a follow-up with him in the office. He will try to get her an appointment as soon as possible. I will give her Percocet for pain at home.  Patient is also found to be tachycardic during today's visit, although when pain is well managed heart rate is down to 105. I reviewed patient's records, history of tachycardia in the past. She has also had an episode about a year ago when she went into atrial flutter, but converted on her  own. Burnis Medin get an EKG prior to discharge.   EKG showing prolonged QT, 650 ms. Patient does not appear to be in any QT prolonging medications. Last EKGs showed QTC about 500. We will get cardiology to look at the EKG.  Spoke with Dr. Radford Pax who measured QTc, and it is 0.34 measured, possibly counting next p wave. Will dc home.   Vitals:   04/19/17 1430 04/19/17 1450 04/19/17 1500 04/19/17 1600  BP: 138/86 138/86 (!) 152/84 (!) 181/103  Pulse: (!) 104 (!) 104 (!) 105 (!) 121  Resp:  20  13  Temp:  98 F (36.7 C)    TempSrc:  Oral    SpO2: 100% 100% 96% 100%       Final Clinical Impressions(s) / ED Diagnoses   Final diagnoses:  Mass of subcutaneous tissue of back  Tachycardia    New Prescriptions New Prescriptions   OXYCODONE-ACETAMINOPHEN (PERCOCET) 5-325 MG TABLET    Take 1 tablet by mouth every 4 (four) hours as needed for severe pain.     Jeannett Senior, PA-C 04/20/17 1623    Sherwood Gambler, MD 04/20/17 [phone]

## 2017-04-19 NOTE — ED Notes (Signed)
Patient transported to CT 

## 2017-04-19 NOTE — Telephone Encounter (Signed)
lvm with pt that we received the message

## 2017-04-19 NOTE — Discharge Instructions (Signed)
Take percocet as prescribed as needed for pain. Please follow up with Dr. Julien Nordmann As soo as able fur further treatment.

## 2017-04-19 NOTE — ED Triage Notes (Signed)
Patient states that she is in generalized pain in back and abd that has been on going and been seen by multiple doctors trying to figure out what is wrong with pt. Patient  had biopsy on 7/26.

## 2017-04-19 NOTE — Telephone Encounter (Signed)
Pt called to let Dr Julien Nordmann know she is in ER with back pain.

## 2017-04-19 NOTE — ED Notes (Signed)
Patient assisted to the restroom

## 2017-04-20 ENCOUNTER — Encounter: Payer: Self-pay | Admitting: Radiation Oncology

## 2017-04-22 ENCOUNTER — Encounter: Payer: Self-pay | Admitting: Radiation Oncology

## 2017-04-22 NOTE — Progress Notes (Signed)
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 Julien Nordmann 1) Concurrent chemoradiation with weekly carboplatin for AUC of 2 and paclitaxel 45 MG/M2. First dose 08/19/2015. Status post 5 cycles. Last cycle was given 10/07/2015 with significant response. 2) Consolidation chemotherapy with carboplatin for AUC of 5 and paclitaxel 175 MG/M2 every 3 weeks with Neulasta support. First dose 11/18/2015. Status post 3 cycles.  Patient's main complaints related to symptomatic tumor(s) are: persistent right back pain and right lower rib/anterior chest pain  Pain on a scale of 0-10 is: 7 out of 10 begin under right breast and radiates around to right back. Patient taking hydrocodone acetaminophen 5/325 1-2 tablets q6h to manage pain.    If Spine Met(s), symptoms, if any, include:  Bowel/Bladder retention or incontinence (please describe): constipation managed with linzess  Numbness or weakness in extremities (please describe): no  Current Decadron regimen, if applicable: no  Ambulatory status? Walker? Wheelchair?: ambulatory  SAFETY ISSUES:  Prior radiation? Yes  Pacemaker/ICD? No  Possible current pregnancy? No  Is the patient on methotrexate?  No  Additional Complaints / other details:  56 year old female. Large right back mass has not caused lymphedema or decreased ROM of right arm. Patient is unable to lay flat due to mass. Patient reports the mass has been present since April but, has decreased in size since biopsy in July.

## 2017-04-23 ENCOUNTER — Other Ambulatory Visit: Payer: Self-pay | Admitting: Hematology

## 2017-04-23 ENCOUNTER — Telehealth: Payer: Self-pay

## 2017-04-23 DIAGNOSIS — M7989 Other specified soft tissue disorders: Secondary | ICD-10-CM

## 2017-04-23 MED ORDER — HYDROCODONE-ACETAMINOPHEN 5-325 MG PO TABS: ORAL_TABLET | ORAL | 0 refills | Status: DC

## 2017-04-23 NOTE — Telephone Encounter (Signed)
Prescription here for p/u for hydrocodone. Pt's daughter to come with ID this afternoon.

## 2017-04-23 NOTE — Telephone Encounter (Signed)
Ask dr Julien Nordmann if she can have a stronger pain pill. The percocet given in ER is not helping. The CT on 7/30 shows 9th/10th rib interspace and osseous involvement. The hydrocodone 5-325 are working and take the pain from 10/10 to 8/10. She wants permission to take 2 pills and she needs a refill. Or does MD suggest something else.   She sees Dr Tammi Klippel reconsult on Monday.

## 2017-04-26 ENCOUNTER — Encounter: Payer: Self-pay | Admitting: Radiation Oncology

## 2017-04-26 ENCOUNTER — Ambulatory Visit
Admission: RE | Admit: 2017-04-26 | Discharge: 2017-04-26 | Disposition: A | Discharge status: Home / Self Care | Payer: Medicare Other | Source: Ambulatory Visit | Attending: Radiation Oncology | Admitting: Radiation Oncology

## 2017-04-26 VITALS — BP 125/78 | HR 136 | Resp 16 | Wt 228.2 lb

## 2017-04-26 DIAGNOSIS — Z923 Personal history of irradiation: Secondary | ICD-10-CM | POA: Diagnosis not present

## 2017-04-26 DIAGNOSIS — C4492 Squamous cell carcinoma of skin, unspecified: Secondary | ICD-10-CM

## 2017-04-26 DIAGNOSIS — I4892 Unspecified atrial flutter: Secondary | ICD-10-CM | POA: Diagnosis not present

## 2017-04-26 DIAGNOSIS — Z87891 Personal history of nicotine dependence: Secondary | ICD-10-CM | POA: Insufficient documentation

## 2017-04-26 DIAGNOSIS — Z85118 Personal history of other malignant neoplasm of bronchus and lung: Secondary | ICD-10-CM | POA: Diagnosis not present

## 2017-04-26 DIAGNOSIS — C7989 Secondary malignant neoplasm of other specified sites: Secondary | ICD-10-CM | POA: Diagnosis not present

## 2017-04-26 DIAGNOSIS — C342 Malignant neoplasm of middle lobe, bronchus or lung: Secondary | ICD-10-CM

## 2017-04-26 DIAGNOSIS — Z510 Encounter for antineoplastic radiation therapy: Secondary | ICD-10-CM | POA: Insufficient documentation

## 2017-04-26 DIAGNOSIS — E876 Hypokalemia: Secondary | ICD-10-CM | POA: Diagnosis not present

## 2017-04-26 HISTORY — DX: Personal history of irradiation: Z92.3

## 2017-04-26 NOTE — Progress Notes (Signed)
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 Squamous cell carcinoma of skin, unspecified C44.92   2. 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     She had chemoradiation with good response.    In routine follow-up, she now has a right upper back muscle metastasis.  This was seen on 03/30/17 chest CT as an 8.9 cm mass along the right posterolateral chest wall, with additional tumor along the right posterior chest wall/pleural space at the 9th and 10th rib interspace. Associated osseous involvement   U/S guided biopsy was done on 04/15/17 confirming squamous cell carcinoma     Of note, pt presents to the office today and notes she initially felt a pain and noticed a small "knot" to her right thoracic area and was evaluated at an Urgent Care. Pt was informed to stop eating fried foods to alleviate her symptoms. Pt reports that she went to the ED several times in order to determine a dx. Pt reports that her right mid back pain is worsened with constipation and laying flat on her back. Pt right sided back pain/"knot" is alleviated with support from her bra. Pt notes that she followed up with Dr. Julien Nordmann who performed a biopsy of the area and found abnormalities and informed the pt to follow up with Dr. Tammi Klippel for further evaluation of her symptoms. She notes  right sided rib pain. Pt denies any further symptoms at this time.   PREVIOUS RADIATION THERAPY: Yes  08/19/2015-10/07/2015: The patient's primary tumor and involved lymph nodes were treated to 66 Gy in 30 fractions with concurrent weekly carboplatin and paclitaxel   Past Medical History:  Diagnosis Date  . Cancer ([place])    5 years ago - cervical cancer  . Encounter for antineoplastic chemotherapy 08/27/2015  . History of radiation therapy 08/19/2015 - 10/07/2015   Site/dose:   The patient's primary tumor and involved lymph nodes were treated to 66 Gy in 30 fractions.  . Hypokalemia 09/30/2015  . Non-small cell carcinoma of lung, stage 3 ([place]) 08/08/2015  . Paroxysmal atrial flutter ([place]) 09/16/2015  . Pneumonia   :   Past Surgical History:  Procedure Laterality Date  . TUBAL LIGATION    . VIDEO BRONCHOSCOPY WITH ENDOBRONCHIAL ULTRASOUND N/A 08/02/2015   Procedure: VIDEO BRONCHOSCOPY WITH ENDOBRONCHIAL ULTRASOUND;  Surgeon: [name], MD;  Location: [facility] OR;  Service: Thoracic;  Laterality: N/A;  . WISDOM TOOTH EXTRACTION    :   Current Outpatient Prescriptions:  .  linaclotide (LINZESS) 145 MCG CAPS capsule, Take 145 mcg by mouth daily before breakfast., Disp: , Rfl:  .  magnesium hydroxide (MILK OF MAGNESIA) 400 MG/5ML suspension, Take 5 mLs by mouth at bedtime as needed for mild constipation., Disp: , Rfl:  .  oxyCODONE-acetaminophen (PERCOCET) 5-325 MG tablet, Take 1 tablet by mouth  every 4 (four) hours as needed for severe pain., Disp: 20 tablet, Rfl: 0 .  docusate sodium (COLACE) 50 MG capsule, Take 1 capsule (50 mg total) by mouth 2 (two) times daily. (Patient not taking: Reported on 03/11/2017), Disp: 60 capsule, Rfl: 0 .  HYDROcodone-acetaminophen (NORCO/VICODIN) 5-325 MG tablet, 1 or 2 tabs PO q6 hours prn pain (Patient not taking: Reported on 04/26/2017), Disp: 30 tablet, Rfl: 0 .  ondansetron (ZOFRAN) 4 MG tablet, Take 1 tablet (4 mg total) by mouth every 8 (eight)  hours as needed for nausea or vomiting. (Patient not taking: Reported on 04/05/2017), Disp: 6 tablet, Rfl: 0:  No Known Allergies:   Family History  Problem Relation Age of Onset  . COPD Mother   . Diabetes type II Mother   . Heart disease Mother   . High Cholesterol Mother   . Lung cancer Father   :   Social History   Social History  . Marital status: Single    Spouse name: N/A  . Number of children: N/A  . Years of education: N/A   Occupational History  . Not on file.   Social History Main Topics  . Smoking status: Former Smoker    Packs/day: 0.30    Years: 5.00    Types: Cigarettes    Quit date: 07/31/1990  . Smokeless tobacco: Never Used  . Alcohol use No  . Drug use: No  . Sexual activity: Not Currently   Other Topics Concern  . Not on file   Social History Narrative  . No narrative on file  :  REVIEW OF SYSTEMS:  A 15 point review of systems is documented in the electronic medical record. This was obtained by the nursing staff. However, I reviewed this with the patient to discuss relevant findings and make appropriate changes.  Pertinent items noted in HPI and remainder of comprehensive ROS otherwise negative.   PHYSICAL EXAM:  Blood pressure 125/78, pulse (!) 136, resp. rate 16, weight 228 lb 3.2 oz (103.5 kg), SpO2 100 %. General appearance: alert, cooperative and appears stated age Skin: Skin color, texture, turgor normal. No rashes or lesions or Beneath the scapula on right chest wall there is a 12 cm firm, subcutaneous mass which is non-tender and fixed to the rib cage. The overlying skin is mobile without ulceration. Neurologic: Grossly normal               KPS = 80  100 - Normal; no complaints; no evidence of disease. 90   - Able to carry on normal activity; minor signs or symptoms of disease. 80   - Normal activity with effort; some signs or symptoms of disease. 56   - Cares for self; unable to carry on normal activity or to do active  work. 60   - Requires occasional assistance, but is able to care for most of his personal needs. 50   - Requires considerable assistance and frequent medical care. 34   - Disabled; requires special care and assistance. 44   - Severely disabled; hospital admission is indicated although death not imminent. 99   - Very sick; hospital admission necessary; active supportive treatment necessary. 10   - Moribund; fatal processes progressing rapidly. 0     - Dead  Karnofsky DA, Abelmann WH, Craver LS and Burchenal [facility] [phone]) The use of the nitrogen mustards in the palliative treatment of carcinoma: with particular reference to bronchogenic carcinoma Cancer 1 634-56  LABORATORY DATA:  Lab Results  Component Value Date   WBC 8.2 04/19/2017   HGB 11.6 (L) 04/19/2017   HCT 33.2 (L) 04/19/2017   MCV 87.6 04/19/2017   PLT 327 04/19/2017   Lab Results  Component Value Date   NA 134 (L) 04/19/2017   K 3.6 04/19/2017   CL 99 (L) 04/19/2017   CO2 24 04/19/2017   Lab Results  Component Value Date   ALT 20 04/19/2017   AST 34 04/19/2017   ALKPHOS 75 04/19/2017   BILITOT 0.7 04/19/2017     RADIOGRAPHY: Ct Angio Chest Pe W And/or Wo Contrast  Result Date: 04/19/2017 CLINICAL DATA:  Chest pain, status post chemotherapy and radiation for lung cancer, status post biopsy of right upper back mass demonstrating squamous cell carcinoma EXAM: CT ANGIOGRAPHY CHEST WITH CONTRAST TECHNIQUE: Multidetector CT imaging of the chest was performed using the standard protocol during bolus administration of intravenous contrast. Multiplanar CT image reconstructions and MIPs were obtained to evaluate the vascular anatomy. CONTRAST:  81 mL Isovue 370 IV COMPARISON:  CT chest dated 03/30/2017 FINDINGS: Cardiovascular: Satisfactory opacification of the bilateral pulmonary arteries to the lobar level. No evidence of pulmonary embolism. No evidence of thoracic aortic aneurysm or dissection. The heart is normal in size.  No  pericardial effusion. Mediastinum/Nodes: 13 mm short axis subcarinal node (series 6/ image 35). No suspicious hilar or axillary lymphadenopathy. Visualized thyroid is unremarkable. Lungs/Pleura: Radiation changes in the right upper lobe and paramediastinal region. Subpleural nodularity in the posterior right lower lobe (series 13/image 9), reflecting tumor extension. Left lung is clear. No pneumothorax. Upper Abdomen: 11 mm probable cyst in the anterior right hepatic dome (series 6/image 59). 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 Additional tumor along the right posterior chest wall/ pleural space at the 9th and 10th rib interspace (series 6/ image 61), with associated osseous involvement. Review of the MIP images confirms the above findings. IMPRESSION: No evidence pulmonary embolism. Radiation changes in the right lung. 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 Additional tumor along the right posterior chest wall/pleural space at the 9th and 10th rib interspace. Associated osseous involvement. Electronically Signed   By: Julian Hy M.D.   On: 04/19/2017 14:28   Nm Hepato W/eject Fract  Result Date: 04/14/2017 CLINICAL DATA:  56 year old female with right upper quadrant abdominal pain and nausea/vomiting. EXAM: NUCLEAR MEDICINE HEPATOBILIARY IMAGING WITH GALLBLADDER EF TECHNIQUE: Sequential images of the abdomen were obtained out to 60 minutes following intravenous administration of radiopharmaceutical. After oral ingestion of 8 ounces of Ensure, gallbladder ejection fraction was determined. RADIOPHARMACEUTICALS:  5.0 mCi Tc-51m Choletec IV COMPARISON:  12/22/2016 ultrasound FINDINGS: Prompt uptake and biliary excretion of activity by the liver is seen. Gallbladder activity is visualized, consistent with patency of cystic  duct. Biliary activity passes into small bowel, consistent with patent common bile duct. Calculated gallbladder ejection fraction is 58%. (Normal gallbladder ejection fraction with Ensure is greater than 33%.) IMPRESSION: Normal hepatobiliary study with normal gallbladder ejection fraction. Electronically Signed   By: Margarette Canada M.D.   On: 04/14/2017 15:43   US Biopsy  Result Date: 04/15/2017 CLINICAL DATA:  History of right lung carcinoma, resected. Right posterior chest wall mass. EXAM: ULTRASOUND GUIDED CORE BIOPSY OF RIGHT POSTERIOR CHEST WALL MASS MEDICATIONS: Intravenous Fentanyl and Versed were administered as conscious sedation during continuous monitoring of the patient's level of consciousness and physiological /  cardiorespiratory status by the radiology RN, with a total moderate sedation time of 12 minutes. PROCEDURE: The procedure, risks, benefits, and alternatives were explained to the patient. Questions regarding the procedure were encouraged and answered. The patient understands and consents to the procedure. Survey ultrasound of the right back performed. The lesion was localized and an appropriate skin entry site was determined and marked. The operative field was prepped with chlorhexidine in a sterile fashion, and a sterile drape was applied covering the operative field. A sterile gown and sterile gloves were used for the procedure. Local anesthesia was provided with 1% Lidocaine. Under real-time ultrasound guidance, a 17 gauge trocar needle was advanced to the margin of the lesion. Once needle tip position was confirmed, coaxial 18-gauge core biopsy samples were obtained, submitted in formalin to surgical pathology. The guide needle was removed. The patient tolerated the procedure well. Postprocedure scans show no hemorrhage or other apparent complication. COMPLICATIONS: None immediate FINDINGS: Right posterior chest wall mass was again localized and representative core biopsy samples  obtained. IMPRESSION: 1. Technically successful ultrasound-guided core biopsy, right back mass. Electronically Signed   By: Lucrezia Europe M.D.   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  PLAN:  At this point, localized radiotherapy could be used in the form of conventional chemoradiation versus SBRT.  Recent chest and abdomen CT has been helpful to to suggest that the current soft tissue metastasis represents an isolated oligometastatic deposit.  However, additional imaging, including brain MRI and possibly re-staging PET may be warranted.  I recommend the pt to follow up for a re-staging PET scan and brain MRI on prior to her CT simulation appointment on 05/07/17. Patient will undergo radiotherapy following her CT simulation appointment We will call the patient with the results of her scans prior to her appointments.   I discussed the risks, benefits, short and long term effects of treatment as well as the delivery and logistics of treatment with the patient. Encouraged the patient to ask questions, to which all questions were answered during her visit today. Patient encouraged to call the office if she has any further questions prior to her appointment.   I spent 40 minutes face to face with the patient and more than 50% of that time was spent in counseling and/or coordination of care.  This document serves as a record of services personally performed by Tyler Pita, MD. It was created on her behalf by Steva Colder, a trained medical scribe. The creation of this record is based on the scribe's personal observations and the provider's statements to them. This document has been checked and approved by the attending provider.     ------------------------------------------------   Tyler Pita, MD [place]  Director and Director of Stereotactic Radiosurgery Direct Dial: [phone]  Fax: [phone] [email or web address]  Skype  [email or web address]

## 2017-04-26 NOTE — Progress Notes (Signed)
See progress note under physician encounter. 

## 2017-04-27 ENCOUNTER — Telehealth: Payer: Self-pay | Admitting: Radiation Oncology

## 2017-04-27 NOTE — Telephone Encounter (Signed)
LVM for patient to return call in reference MRI/PET appointments, left my call back number with any questions, and left number to Radiology if appointment times don't work for patient.

## 2017-04-29 ENCOUNTER — Encounter (HOSPITAL_COMMUNITY): Payer: Self-pay

## 2017-04-30 ENCOUNTER — Other Ambulatory Visit: Payer: Self-pay | Admitting: Oncology

## 2017-04-30 ENCOUNTER — Telehealth: Payer: Self-pay | Admitting: Radiation Oncology

## 2017-04-30 ENCOUNTER — Other Ambulatory Visit: Payer: Self-pay | Admitting: Medical Oncology

## 2017-04-30 DIAGNOSIS — M7989 Other specified soft tissue disorders: Secondary | ICD-10-CM

## 2017-04-30 MED ORDER — HYDROCODONE-ACETAMINOPHEN 5-325 MG PO TABS: ORAL_TABLET | ORAL | 0 refills | Status: DC

## 2017-04-30 NOTE — Telephone Encounter (Signed)
Returned patient's call. Patient reports she only have three tablets left of the hydrocodone-acetaminophen prescribed by Dr. Irene Limbo on 04/23/2017. Explained that pain medications need to come from one/same physician. Patient verbalized understanding. Transferred call to medical oncology.

## 2017-05-03 ENCOUNTER — Encounter: Payer: Self-pay | Admitting: Internal Medicine

## 2017-05-03 ENCOUNTER — Other Ambulatory Visit: Payer: Self-pay | Admitting: Medical Oncology

## 2017-05-03 ENCOUNTER — Ambulatory Visit (HOSPITAL_BASED_OUTPATIENT_CLINIC_OR_DEPARTMENT_OTHER): Payer: Medicare Other

## 2017-05-03 ENCOUNTER — Ambulatory Visit (HOSPITAL_BASED_OUTPATIENT_CLINIC_OR_DEPARTMENT_OTHER): Payer: Medicare Other | Admitting: Internal Medicine

## 2017-05-03 VITALS — BP 125/74 | HR 100 | Temp 98.1°F | Resp 20 | Ht 68.0 in | Wt 224.0 lb

## 2017-05-03 DIAGNOSIS — M7989 Other specified soft tissue disorders: Secondary | ICD-10-CM

## 2017-05-03 DIAGNOSIS — G893 Neoplasm related pain (acute) (chronic): Secondary | ICD-10-CM

## 2017-05-03 DIAGNOSIS — R5382 Chronic fatigue, unspecified: Secondary | ICD-10-CM

## 2017-05-03 DIAGNOSIS — C3491 Malignant neoplasm of unspecified part of right bronchus or lung: Secondary | ICD-10-CM | POA: Diagnosis not present

## 2017-05-03 DIAGNOSIS — R000 Tachycardia, unspecified: Secondary | ICD-10-CM | POA: Diagnosis not present

## 2017-05-03 DIAGNOSIS — E860 Dehydration: Secondary | ICD-10-CM

## 2017-05-03 DIAGNOSIS — C3490 Malignant neoplasm of unspecified part of unspecified bronchus or lung: Secondary | ICD-10-CM | POA: Insufficient documentation

## 2017-05-03 MED ORDER — SODIUM CHLORIDE 0.9 % IV SOLN: Freq: Once | INTRAVENOUS | Status: DC

## 2017-05-03 MED ORDER — MORPHINE SULFATE 4 MG/ML IJ SOLN
2.0000 mg | Freq: Once | INTRAMUSCULAR | Status: AC
  Administered 2017-05-03: 2 mg via INTRAVENOUS
  Filled 2017-05-03: qty 1

## 2017-05-03 MED ORDER — MORPHINE SULFATE (PF) 4 MG/ML IV SOLN
INTRAVENOUS | Status: AC
  Filled 2017-05-03: qty 1

## 2017-05-03 MED ORDER — HYDROCODONE-ACETAMINOPHEN 5-325 MG PO TABS
ORAL_TABLET | ORAL | 0 refills | Status: DC
Start: 2017-05-03 — End: 2017-05-27

## 2017-05-03 MED ORDER — SODIUM CHLORIDE 0.9 % IV SOLN
Freq: Once | INTRAVENOUS | Status: AC
  Administered 2017-05-03: 11:00:00 via INTRAVENOUS

## 2017-05-03 MED ORDER — ONDANSETRON HCL 8 MG PO TABS: 8.0000 mg | ORAL_TABLET | Freq: Once | ORAL | Status: DC

## 2017-05-03 MED ORDER — ONDANSETRON HCL 4 MG/2ML IJ SOLN
8.0000 mg | Freq: Once | INTRAMUSCULAR | Status: AC
  Administered 2017-05-03: 8 mg via INTRAVENOUS

## 2017-05-03 MED ORDER — MORPHINE SULFATE (PF) 4 MG/ML IV SOLN
2.0000 mg | Freq: Once | INTRAVENOUS | Status: AC
Start: 2017-05-03 — End: 2017-05-03
  Administered 2017-05-03: 2 mg via INTRAVENOUS

## 2017-05-03 MED ORDER — ONDANSETRON HCL 4 MG/2ML IJ SOLN
INTRAMUSCULAR | Status: AC
  Filled 2017-05-03: qty 4

## 2017-05-03 MED ORDER — FENTANYL 50 MCG/HR TD PT72: 50.0000 ug | MEDICATED_PATCH | TRANSDERMAL | 0 refills | Status: DC

## 2017-05-03 MED ORDER — ONDANSETRON HCL 8 MG PO TABS
ORAL_TABLET | ORAL | Status: AC
  Filled 2017-05-03: qty 1

## 2017-05-03 NOTE — Patient Instructions (Signed)
Dehydration, Adult Dehydration is a condition in which there is not enough fluid or water in the body. This happens when you lose more fluids than you take in. Important organs, such as the kidneys, brain, and heart, cannot function without a proper amount of fluids. Any loss of fluids from the body can lead to dehydration. Dehydration can range from mild to severe. This condition should be treated right away to prevent it from becoming severe. What are the causes? This condition may be caused by:  Vomiting.  Diarrhea.  Excessive sweating, such as from heat exposure or exercise.  Not drinking enough fluid, especially: ? When ill. ? While doing activity that requires a lot of energy.  Excessive urination.  Fever.  Infection.  Certain medicines, such as medicines that cause the body to lose excess fluid (diuretics).  Inability to access safe drinking water.  Reduced physical ability to get adequate water and food.  What increases the risk? This condition is more likely to develop in people:  Who have a poorly controlled long-term (chronic) illness, such as diabetes, heart disease, or kidney disease.  Who are age 65 or older.  Who are disabled.  Who live in a place with high altitude.  Who play endurance sports.  What are the signs or symptoms? Symptoms of mild dehydration may include:  Thirst.  Dry lips.  Slightly dry mouth.  Dry, warm skin.  Dizziness. Symptoms of moderate dehydration may include:  Very dry mouth.  Muscle cramps.  Dark urine. Urine may be the color of tea.  Decreased urine production.  Decreased tear production.  Heartbeat that is irregular or faster than normal (palpitations).  Headache.  Light-headedness, especially when you stand up from a sitting position.  Fainting (syncope). Symptoms of severe dehydration may include:  Changes in skin, such as: ? Cold and clammy skin. ? Blotchy (mottled) or pale skin. ? Skin that does  not quickly return to normal after being lightly pinched and released (poor skin turgor).  Changes in body fluids, such as: ? Extreme thirst. ? No tear production. ? Inability to sweat when body temperature is high, such as in hot weather. ? Very little urine production.  Changes in vital signs, such as: ? Weak pulse. ? Pulse that is more than 100 beats a minute when sitting still. ? Rapid breathing. ? Low blood pressure.  Other changes, such as: ? Sunken eyes. ? Cold hands and feet. ? Confusion. ? Lack of energy (lethargy). ? Difficulty waking up from sleep. ? Short-term weight loss. ? Unconsciousness. How is this diagnosed? This condition is diagnosed based on your symptoms and a physical exam. Blood and urine tests may be done to help confirm the diagnosis. How is this treated? Treatment for this condition depends on the severity. Mild or moderate dehydration can often be treated at home. Treatment should be started right away. Do not wait until dehydration becomes severe. Severe dehydration is an emergency and it needs to be treated in a hospital. Treatment for mild dehydration may include:  Drinking more fluids.  Replacing salts and minerals in your blood (electrolytes) that you may have lost. Treatment for moderate dehydration may include:  Drinking an oral rehydration solution (ORS). This is a drink that helps you replace fluids and electrolytes (rehydrate). It can be found at pharmacies and retail stores. Treatment for severe dehydration may include:  Receiving fluids through an IV tube.  Receiving an electrolyte solution through a feeding tube that is passed through your nose   and into your stomach (nasogastric tube, or NG tube).  Correcting any abnormalities in electrolytes.  Treating the underlying cause of dehydration. Follow these instructions at home:  If directed by your health care provider, drink an ORS: ? Make an ORS by following instructions on the  package. ? Start by drinking small amounts, about  cup (120 mL) every 5-10 minutes. ? Slowly increase how much you drink until you have taken the amount recommended by your health care provider.  Drink enough clear fluid to keep your urine clear or pale yellow. If you were told to drink an ORS, finish the ORS first, then start slowly drinking other clear fluids. Drink fluids such as: ? Water. Do not drink only water. Doing that can lead to having too little salt (sodium) in the body (hyponatremia). ? Ice chips. ? Fruit juice that you have added water to (diluted fruit juice). ? Low-calorie sports drinks.  Avoid: ? Alcohol. ? Drinks that contain a lot of sugar. These include high-calorie sports drinks, fruit juice that is not diluted, and soda. ? Caffeine. ? Foods that are greasy or contain a lot of fat or sugar.  Take over-the-counter and prescription medicines only as told by your health care provider.  Do not take sodium tablets. This can lead to having too much sodium in the body (hypernatremia).  Eat foods that contain a healthy balance of electrolytes, such as bananas, oranges, potatoes, tomatoes, and spinach.  Keep all follow-up visits as told by your health care provider. This is important. Contact a health care provider if:  You have abdominal pain that: ? Gets worse. ? Stays in one area (localizes).  You have a rash.  You have a stiff neck.  You are more irritable than usual.  You are sleepier or more difficult to wake up than usual.  You feel weak or dizzy.  You feel very thirsty.  You have urinated only a small amount of very dark urine over 6-8 hours. Get help right away if:  You have symptoms of severe dehydration.  You cannot drink fluids without vomiting.  Your symptoms get worse with treatment.  You have a fever.  You have a severe headache.  You have vomiting or diarrhea that: ? Gets worse. ? Does not go away.  You have blood or green matter  (bile) in your vomit.  You have blood in your stool. This may cause stool to look black and tarry.  You have not urinated in 6-8 hours.  You faint.  Your heart rate while sitting still is over 100 beats a minute.  You have trouble breathing. This information is not intended to replace advice given to you by your health care provider. Make sure you discuss any questions you have with your health care provider. Document Released: 09/07/2005 Document Revised: 04/03/2016 Document Reviewed: 11/01/2015 Elsevier Interactive Patient Education  2018 [employer]  

## 2017-05-03 NOTE — Progress Notes (Addendum)
[place] [phone]   [phone]) [phone]  OFFICE PROGRESS NOTE  [name], PA-C [place] [address]  DIAGNOSIS: Metastatic non-small cell lung cancer initially diagnosed as Stage IIIA (T2b, N2, M0) non-small cell lung cancer, invasive poorly differentiated squamous cell carcinoma diagnosed in November 2016 and presented with large right hilar mass with collapse of the right middle lobe and extension into the right upper lobe and right lower lobe along the hilum with right paratracheal and subcarinal lymphadenopathy. She presented in July 2018 with metastatic intramuscular mass in the right posterior lateral chest wall, with biopsy confirmed squamous cell carcinoma. PDL1 Expression: 50%.  PRIOR THERAPY:  1) Concurrent chemoradiation with weekly carboplatin for AUC of 2 and paclitaxel 45 MG/M2. First dose 08/19/2015. Status post 5 cycles. Last cycle was given 10/07/2015 with significant response. 2) Consolidation chemotherapy with carboplatin for AUC of 5 and paclitaxel 175 MG/M2 every 3 weeks with Neulasta support. First dose 11/18/2015. Status post 3 cycles. 3) palliative radiotherapy to the intramuscular mass in the right posterior lateral chest wall under the care of Dr. Tammi Klippel.  CURRENT THERAPY: Ketruda 200 mg IV every 3 weeks. First dose 05/12/2017..  INTERVAL HISTORY: Theresa [name] 56 y.o. female returns to the clinic today for follow-up visit accompanied by family member. The patient continues to complain of severe pain on the right scapular area. She was found to have mass in that area that was biopsy proven to be metastatic squamous cell carcinoma. She is expected to start palliative radiotherapy under the care of Dr. Tammi Klippel very soon. She had PDL 1 expression of 50%. She denied having any chest pain, shortness of breath, cough or hemoptysis. She continues to have tachycardia and she was evaluated by her cardiologist and it was  felt to be secondary to her increased pain level. She is currently on hydrocodone for pain management. She denied having any fever or chills. She has no significant weight loss or night sweats. She has no nausea, vomiting, diarrhea or constipation. She is here today for evaluation and discussion of her treatment options.   MEDICAL HISTORY: Past Medical History:  Diagnosis Date  . Cancer ([place])    5 years ago - cervical cancer  . Encounter for antineoplastic chemotherapy 08/27/2015  . History of radiation therapy 08/19/2015 - 10/07/2015   Site/dose:   The patient's primary tumor and involved lymph nodes were treated to 66 Gy in 30 fractions.  . Hypokalemia 09/30/2015  . Non-small cell carcinoma of lung, stage 3 ([place]) 08/08/2015  . Paroxysmal atrial flutter ([place]) 09/16/2015  . Pneumonia     ALLERGIES:  has No Known Allergies.  MEDICATIONS:  Current Outpatient Prescriptions  Medication Sig Dispense Refill  . docusate sodium (COLACE) 50 MG capsule Take 1 capsule (50 mg total) by mouth 2 (two) times daily. (Patient not taking: Reported on 03/11/2017) 60 capsule 0  . HYDROcodone-acetaminophen (NORCO/VICODIN) 5-325 MG tablet 1 or 2 tabs PO q6 hours prn pain 30 tablet 0  . linaclotide (LINZESS) 145 MCG CAPS capsule Take 145 mcg by mouth daily before breakfast.    . magnesium hydroxide (MILK OF MAGNESIA) 400 MG/5ML suspension Take 5 mLs by mouth at bedtime as needed for mild constipation.    . ondansetron (ZOFRAN) 4 MG tablet Take 1 tablet (4 mg total) by mouth every 8 (eight) hours as needed for nausea or vomiting. (Patient not taking: Reported on 04/05/2017) 6 tablet 0  . oxyCODONE-acetaminophen (PERCOCET) 5-325 MG  tablet Take 1 tablet by mouth every 4 (four) hours as needed for severe pain. 20 tablet 0   No current facility-administered medications for this visit.     SURGICAL HISTORY:  Past Surgical History:  Procedure Laterality Date  . TUBAL LIGATION    . VIDEO BRONCHOSCOPY WITH  ENDOBRONCHIAL ULTRASOUND N/A 08/02/2015   Procedure: VIDEO BRONCHOSCOPY WITH ENDOBRONCHIAL ULTRASOUND;  Surgeon: [name], MD;  Location: [facility];  Service: Thoracic;  Laterality: N/A;  . WISDOM TOOTH EXTRACTION      REVIEW OF SYSTEMS:  Constitutional: positive for fatigue Eyes: negative Ears, nose, mouth, throat, and face: negative Respiratory: negative Cardiovascular: negative Gastrointestinal: negative Genitourinary:negative Integument/breast: negative Hematologic/lymphatic: negative Musculoskeletal:positive for back pain Neurological: negative Behavioral/Psych: negative Endocrine: negative Allergic/Immunologic: negative   PHYSICAL EXAMINATION: General appearance: alert, cooperative, fatigued and no distress Head: Normocephalic, without obvious abnormality, atraumatic Neck: no adenopathy, no JVD, supple, symmetrical, trachea midline and thyroid not enlarged, symmetric, no tenderness/mass/nodules Lymph nodes: Cervical, supraclavicular, and axillary nodes normal. Resp: clear to auscultation bilaterally Back: symmetric, no curvature. ROM normal. No CVA tenderness. Cardio: regular rate and rhythm, S1, S2 normal, no murmur, click, rub or gallop GI: soft, non-tender; bowel sounds normal; no masses,  no organomegaly Extremities: extremities normal, atraumatic, no cyanosis or edema Neurologic: Alert and oriented X 3, normal strength and tone. Normal symmetric reflexes. Normal coordination and gait  Examination of the back showed a palpable 14.0 cm mass above the right scapula.  ECOG PERFORMANCE STATUS: 1 - Symptomatic but completely ambulatory  Blood pressure 125/74, pulse 100, temperature 98.1 F (36.7 C), temperature source Oral, resp. rate 20, height '5\' 8"'  (1.727 m), weight 224 lb (101.6 kg), SpO2 100 %.  LABORATORY DATA: Lab Results  Component Value Date   WBC 8.2 04/19/2017   HGB 11.6 (L) 04/19/2017   HCT 33.2 (L) 04/19/2017   MCV 87.6 04/19/2017   PLT 327  04/19/2017      Chemistry      Component Value Date/Time   NA 134 (L) 04/19/2017 1207   NA 143 03/30/2017 0835   K 3.6 04/19/2017 1207   K 3.9 03/30/2017 0835   CL 99 (L) 04/19/2017 1207   CO2 24 04/19/2017 1207   CO2 25 03/30/2017 0835   BUN 8 04/19/2017 1207   BUN 8.7 03/30/2017 0835   CREATININE 0.81 04/19/2017 1207   CREATININE 0.9 03/30/2017 0835      Component Value Date/Time   CALCIUM 9.3 04/19/2017 1207   CALCIUM 10.0 03/30/2017 0835   ALKPHOS 75 04/19/2017 1207   ALKPHOS 84 03/30/2017 0835   AST 34 04/19/2017 1207   AST 29 03/30/2017 0835   ALT 20 04/19/2017 1207   ALT 16 03/30/2017 0835   BILITOT 0.7 04/19/2017 1207   BILITOT 0.63 03/30/2017 0835       RADIOGRAPHIC STUDIES: Ct Angio Chest Pe W And/or Wo Contrast  Result Date: 04/19/2017 CLINICAL DATA:  Chest pain, status post chemotherapy and radiation for lung cancer, status post biopsy of right upper back mass demonstrating squamous cell carcinoma EXAM: CT ANGIOGRAPHY CHEST WITH CONTRAST TECHNIQUE: Multidetector CT imaging of the chest was performed using the standard protocol during bolus administration of intravenous contrast. Multiplanar CT image reconstructions and MIPs were obtained to evaluate the vascular anatomy. CONTRAST:  81 mL Isovue 370 IV COMPARISON:  CT chest dated 03/30/2017 FINDINGS: Cardiovascular: Satisfactory opacification of the bilateral pulmonary arteries to the lobar level. No evidence of pulmonary embolism. No evidence of thoracic aortic aneurysm or  dissection. The heart is normal in size.  No pericardial effusion. Mediastinum/Nodes: 13 mm short axis subcarinal node (series 6/ image 35). No suspicious hilar or axillary lymphadenopathy. Visualized thyroid is unremarkable. Lungs/Pleura: Radiation changes in the right upper lobe and paramediastinal region. Subpleural nodularity in the posterior right lower lobe (series 13/image 9), reflecting tumor extension. Left lung is clear. No pneumothorax.  Upper Abdomen: 11 mm probable cyst in the anterior right hepatic dome (series 6/image 59). 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 Additional tumor along the right posterior chest wall/ pleural space at the 9th and 10th rib interspace (series 6/ image 61), with associated osseous involvement. Review of the MIP images confirms the above findings. IMPRESSION: No evidence pulmonary embolism. Radiation changes in the right lung. 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 Additional tumor along the right posterior chest wall/pleural space at the 9th and 10th rib interspace. Associated osseous involvement. Electronically Signed   By: Julian Hy M.D.   On: 04/19/2017 14:28   Nm Hepato W/eject Fract  Result Date: 04/14/2017 CLINICAL DATA:  56 year old female with right upper quadrant abdominal pain and nausea/vomiting. EXAM: NUCLEAR MEDICINE HEPATOBILIARY IMAGING WITH GALLBLADDER EF TECHNIQUE: Sequential images of the abdomen were obtained out to 60 minutes following intravenous administration of radiopharmaceutical. After oral ingestion of 8 ounces of Ensure, gallbladder ejection fraction was determined. RADIOPHARMACEUTICALS:  5.0 mCi Tc-74mCholetec IV COMPARISON:  12/22/2016 ultrasound FINDINGS: Prompt uptake and biliary excretion of activity by the liver is seen. Gallbladder activity is visualized, consistent with patency of cystic duct. Biliary activity passes into small bowel, consistent with patent common bile duct. Calculated gallbladder ejection fraction is 58%. (Normal gallbladder ejection fraction with Ensure is greater than 33%.) IMPRESSION: Normal hepatobiliary study with normal gallbladder ejection fraction. Electronically Signed   By: JMargarette CanadaM.D.   On: 04/14/2017 15:43   [place]  Result Date:  04/15/2017 CLINICAL DATA:  History of right lung carcinoma, resected. Right posterior chest wall mass. EXAM: ULTRASOUND GUIDED CORE BIOPSY OF RIGHT POSTERIOR CHEST WALL MASS MEDICATIONS: Intravenous Fentanyl and Versed were administered as conscious sedation during continuous monitoring of the patient's level of consciousness and physiological / cardiorespiratory status by the radiology RN, with a total moderate sedation time of 12 minutes. PROCEDURE: The procedure, risks, benefits, and alternatives were explained to the patient. Questions regarding the procedure were encouraged and answered. The patient understands and consents to the procedure. Survey ultrasound of the right back performed. The lesion was localized and an appropriate skin entry site was determined and marked. The operative field was prepped with chlorhexidine in a sterile fashion, and a sterile drape was applied covering the operative field. A sterile gown and sterile gloves were used for the procedure. Local anesthesia was provided with 1% Lidocaine. Under real-time ultrasound guidance, a 17 gauge trocar needle was advanced to the margin of the lesion. Once needle tip position was confirmed, coaxial 18-gauge core biopsy samples were obtained, submitted in formalin to surgical pathology. The guide needle was removed. The patient tolerated the procedure well. Postprocedure scans show no hemorrhage or other apparent complication. COMPLICATIONS: None immediate FINDINGS: Right posterior chest wall mass was again localized and representative core biopsy samples obtained. IMPRESSION: 1. Technically successful ultrasound-guided core biopsy, right back mass. Electronically Signed   By: DLucrezia EuropeM.D.   On: 04/15/2017 09:31    ASSESSMENT AND  PLAN:  This is a very pleasant 56 years old African-American female with metastatic non-small cell lung cancer that was initially diagnosed as stage IIIa non-small cell lung cancer, squamous cell carcinoma  presented with large right hilar mass and mediastinal lymphadenopathy diagnosed in November 2016. She status post concurrent chemoradiation followed by consolidation chemotherapy. The patient was found recently to have intramuscular metastases and the right posterior chest wall that was biopsy-proven to be squamous cell carcinoma with PDL 1 expression 50%. She is scheduled for a PET scan as well as MRI of the brain next week. I recommended for the patient to proceed with the palliative radiotherapy to the posterior chest for lung mass under the care of Dr. Tammi Klippel. I also discussed with the patient her treatment options including palliative care and hospice referral versus consideration of treatment with immunotherapy with Ketruda (pembrolizumab) 200 mg IV every 3 weeks. The patient is interested in the treatment with immunotherapy. I discussed with her the adverse effect of this treatment including but not limited to immune mediated skin rash, diarrhea, inflammation of the lung, kidney, liver, thyroid or other endocrine dysfunction including type 1 diabetes mellitus. She is expected to start the first dose of this treatment on 05/12/2017. She will have a follow-up appointment at that time. For pain management, this is currently not well controlled. I started the patient on fentanyl patch 50 g/hour every 3 days and she will continue on hydrocodone for the breakthrough pain. She is also expected to have better pain control after the palliative radiotherapy. For the tachycardia, I will arrange for the patient to receive 1 L of normal saline in addition to morphine sulfate intravenously in the clinic today. The patient was advised to call immediately if she has any concerning symptoms in the interval. The patient voices understanding of current disease status and treatment options and is in agreement with the current care plan. All questions were answered. The patient knows to call the clinic with any  problems, questions or concerns. We can certainly see the patient much sooner if necessary.  Disclaimer: This note was dictated with voice recognition software. Similar sounding words can inadvertently be transcribed and may not be corrected upon review.

## 2017-05-03 NOTE — Progress Notes (Signed)
START ON PATHWAY REGIMEN - Non-Small Cell Lung     A cycle is 21 days:     Pembrolizumab   **Always confirm dose/schedule in your pharmacy ordering system**    Patient Characteristics: Stage IV Metastatic, Squamous, PS = 0, 1, First Line, PD-L1 Expression Positive  >= 50% (TPS) AJCC T Category: T2b Current Disease Status: Distant Metastases AJCC N Category: N2 AJCC M Category: M1c AJCC 8 Stage Grouping: IVB Histology: Squamous Cell Line of therapy: First Line PD-L1 Expression Status: PD-L1 Positive >= 50% (TPS) Performance Status: PS = 0, 1 Would you be surprised if this patient died  in the next year? I would NOT be surprised if this patient died in the next year Intent of Therapy: Non-Curative / Palliative Intent, Discussed with Patient

## 2017-05-04 ENCOUNTER — Other Ambulatory Visit: Payer: Self-pay | Admitting: Radiation Oncology

## 2017-05-04 ENCOUNTER — Encounter (HOSPITAL_COMMUNITY): Payer: Self-pay

## 2017-05-04 ENCOUNTER — Encounter (HOSPITAL_COMMUNITY)
Admission: RE | Admit: 2017-05-04 | Discharge: 2017-05-04 | Disposition: A | Discharge status: Home / Self Care | Payer: Medicare Other | Source: Ambulatory Visit | Attending: Radiation Oncology | Admitting: Radiation Oncology

## 2017-05-04 DIAGNOSIS — C342 Malignant neoplasm of middle lobe, bronchus or lung: Secondary | ICD-10-CM

## 2017-05-04 DIAGNOSIS — C3490 Malignant neoplasm of unspecified part of unspecified bronchus or lung: Secondary | ICD-10-CM | POA: Diagnosis not present

## 2017-05-05 ENCOUNTER — Encounter (INDEPENDENT_AMBULATORY_CARE_PROVIDER_SITE_OTHER): Payer: Self-pay

## 2017-05-05 ENCOUNTER — Ambulatory Visit (HOSPITAL_COMMUNITY)
Admission: RE | Admit: 2017-05-05 | Discharge: 2017-05-05 | Disposition: A | Discharge status: Home / Self Care | Payer: Medicare Other | Source: Ambulatory Visit | Attending: Radiation Oncology | Admitting: Radiation Oncology

## 2017-05-05 DIAGNOSIS — C782 Secondary malignant neoplasm of pleura: Secondary | ICD-10-CM | POA: Diagnosis not present

## 2017-05-05 DIAGNOSIS — C342 Malignant neoplasm of middle lobe, bronchus or lung: Secondary | ICD-10-CM | POA: Insufficient documentation

## 2017-05-05 DIAGNOSIS — R590 Localized enlarged lymph nodes: Secondary | ICD-10-CM | POA: Diagnosis not present

## 2017-05-05 LAB — GLUCOSE, CAPILLARY: GLUCOSE-CAPILLARY: 114 mg/dL — AB (ref 65–99)

## 2017-05-05 MED ORDER — FLUDEOXYGLUCOSE F - 18 (FDG) INJECTION
11.4200 | Freq: Once | INTRAVENOUS | Status: AC | PRN
  Administered 2017-05-05: 11.42 via INTRAVENOUS

## 2017-05-07 ENCOUNTER — Ambulatory Visit
Admission: RE | Admit: 2017-05-07 | Discharge: 2017-05-07 | Disposition: A | Discharge status: Home / Self Care | Payer: Medicare Other | Source: Ambulatory Visit | Attending: Radiation Oncology | Admitting: Radiation Oncology

## 2017-05-07 DIAGNOSIS — I4892 Unspecified atrial flutter: Secondary | ICD-10-CM | POA: Diagnosis not present

## 2017-05-07 DIAGNOSIS — E876 Hypokalemia: Secondary | ICD-10-CM | POA: Diagnosis not present

## 2017-05-07 DIAGNOSIS — C3491 Malignant neoplasm of unspecified part of right bronchus or lung: Secondary | ICD-10-CM | POA: Diagnosis not present

## 2017-05-07 DIAGNOSIS — Z87891 Personal history of nicotine dependence: Secondary | ICD-10-CM | POA: Diagnosis not present

## 2017-05-07 DIAGNOSIS — C342 Malignant neoplasm of middle lobe, bronchus or lung: Secondary | ICD-10-CM | POA: Diagnosis not present

## 2017-05-07 DIAGNOSIS — Z510 Encounter for antineoplastic radiation therapy: Secondary | ICD-10-CM | POA: Diagnosis not present

## 2017-05-07 DIAGNOSIS — C7989 Secondary malignant neoplasm of other specified sites: Secondary | ICD-10-CM | POA: Diagnosis not present

## 2017-05-08 NOTE — Progress Notes (Signed)
  Radiation Oncology         [phone] ________________________________  Name: RESHONDA KOERBER MRN: [id number]  Date: 05/07/2017  DOB: 22-Feb-1961  SIMULATION AND TREATMENT PLANNING NOTE    ICD-10-CM   1. Metastatic primary lung cancer, right ([place]) C34.91     DIAGNOSIS:  56 yo woman with a right back/chestwall metastasis from right middle lung cancer  NARRATIVE:  The patient was brought to the [facility].  Identity was confirmed.  All relevant records and images related to the planned course of therapy were reviewed.  The patient freely provided informed written consent to proceed with treatment after reviewing the details related to the planned course of therapy. The consent form was witnessed and verified by the simulation staff.  Then, the patient was set-up in a stable reproducible  supine position for radiation therapy.  CT images were obtained.  Surface markings were placed.  The CT images were loaded into the planning software.  Then the target and avoidance structures were contoured.  Treatment planning then occurred.  The radiation prescription was entered and confirmed.  Then, I designed and supervised the construction of a total of one medically necessary complex treatment device as a body positioner.  I have requested : 3D Simulation  I have requested a DVH of the following structures: left lung, right lung, and target.  PLAN:  The patient will receive 35 Gy in 14 fractions.  ________________________________  Sheral Apley Tammi Klippel, M.D.

## 2017-05-10 DIAGNOSIS — Z87891 Personal history of nicotine dependence: Secondary | ICD-10-CM | POA: Diagnosis not present

## 2017-05-10 DIAGNOSIS — I4892 Unspecified atrial flutter: Secondary | ICD-10-CM | POA: Diagnosis not present

## 2017-05-10 DIAGNOSIS — C3491 Malignant neoplasm of unspecified part of right bronchus or lung: Secondary | ICD-10-CM | POA: Diagnosis not present

## 2017-05-10 DIAGNOSIS — Z510 Encounter for antineoplastic radiation therapy: Secondary | ICD-10-CM | POA: Diagnosis not present

## 2017-05-10 DIAGNOSIS — C7989 Secondary malignant neoplasm of other specified sites: Secondary | ICD-10-CM | POA: Diagnosis not present

## 2017-05-10 DIAGNOSIS — E876 Hypokalemia: Secondary | ICD-10-CM | POA: Diagnosis not present

## 2017-05-10 DIAGNOSIS — C342 Malignant neoplasm of middle lobe, bronchus or lung: Secondary | ICD-10-CM | POA: Diagnosis not present

## 2017-05-11 ENCOUNTER — Other Ambulatory Visit: Payer: Self-pay | Admitting: *Deleted

## 2017-05-12 ENCOUNTER — Telehealth: Payer: Self-pay | Admitting: Oncology

## 2017-05-12 ENCOUNTER — Ambulatory Visit (HOSPITAL_BASED_OUTPATIENT_CLINIC_OR_DEPARTMENT_OTHER): Payer: Medicare Other

## 2017-05-12 ENCOUNTER — Other Ambulatory Visit: Payer: Self-pay | Admitting: Medical Oncology

## 2017-05-12 ENCOUNTER — Encounter: Payer: Self-pay | Admitting: Oncology

## 2017-05-12 ENCOUNTER — Ambulatory Visit
Admission: RE | Admit: 2017-05-12 | Discharge: 2017-05-12 | Disposition: A | Discharge status: Home / Self Care | Payer: Medicare Other | Source: Ambulatory Visit | Attending: Radiation Oncology | Admitting: Radiation Oncology

## 2017-05-12 ENCOUNTER — Ambulatory Visit (HOSPITAL_BASED_OUTPATIENT_CLINIC_OR_DEPARTMENT_OTHER): Payer: Medicare Other | Admitting: Oncology

## 2017-05-12 ENCOUNTER — Other Ambulatory Visit (HOSPITAL_BASED_OUTPATIENT_CLINIC_OR_DEPARTMENT_OTHER): Payer: Medicare Other

## 2017-05-12 VITALS — BP 144/129 | HR 125 | Temp 98.8°F | Resp 21 | Ht 68.0 in | Wt 221.4 lb

## 2017-05-12 VITALS — BP 135/87 | HR 114 | Resp 16 | Ht 68.0 in

## 2017-05-12 DIAGNOSIS — R000 Tachycardia, unspecified: Secondary | ICD-10-CM

## 2017-05-12 DIAGNOSIS — Z510 Encounter for antineoplastic radiation therapy: Secondary | ICD-10-CM | POA: Diagnosis not present

## 2017-05-12 DIAGNOSIS — I878 Other specified disorders of veins: Secondary | ICD-10-CM

## 2017-05-12 DIAGNOSIS — E876 Hypokalemia: Secondary | ICD-10-CM | POA: Diagnosis not present

## 2017-05-12 DIAGNOSIS — C3481 Malignant neoplasm of overlapping sites of right bronchus and lung: Secondary | ICD-10-CM

## 2017-05-12 DIAGNOSIS — I479 Paroxysmal tachycardia, unspecified: Secondary | ICD-10-CM

## 2017-05-12 DIAGNOSIS — C3411 Malignant neoplasm of upper lobe, right bronchus or lung: Secondary | ICD-10-CM

## 2017-05-12 DIAGNOSIS — C3491 Malignant neoplasm of unspecified part of right bronchus or lung: Secondary | ICD-10-CM

## 2017-05-12 DIAGNOSIS — C7989 Secondary malignant neoplasm of other specified sites: Secondary | ICD-10-CM | POA: Diagnosis not present

## 2017-05-12 DIAGNOSIS — C342 Malignant neoplasm of middle lobe, bronchus or lung: Secondary | ICD-10-CM | POA: Diagnosis not present

## 2017-05-12 DIAGNOSIS — R110 Nausea: Secondary | ICD-10-CM

## 2017-05-12 DIAGNOSIS — R112 Nausea with vomiting, unspecified: Secondary | ICD-10-CM

## 2017-05-12 DIAGNOSIS — R5383 Other fatigue: Secondary | ICD-10-CM

## 2017-05-12 DIAGNOSIS — I4892 Unspecified atrial flutter: Secondary | ICD-10-CM | POA: Diagnosis not present

## 2017-05-12 DIAGNOSIS — R5382 Chronic fatigue, unspecified: Secondary | ICD-10-CM

## 2017-05-12 DIAGNOSIS — K5900 Constipation, unspecified: Secondary | ICD-10-CM | POA: Diagnosis not present

## 2017-05-12 DIAGNOSIS — Z5112 Encounter for antineoplastic immunotherapy: Secondary | ICD-10-CM | POA: Insufficient documentation

## 2017-05-12 DIAGNOSIS — K5901 Slow transit constipation: Secondary | ICD-10-CM

## 2017-05-12 DIAGNOSIS — Z87891 Personal history of nicotine dependence: Secondary | ICD-10-CM | POA: Diagnosis not present

## 2017-05-12 LAB — CBC WITH DIFFERENTIAL/PLATELET
BASO%: 0.3 % (ref 0.0–2.0)
Basophils Absolute: 0 10*3/uL (ref 0.0–0.1)
EOS%: 0.6 % (ref 0.0–7.0)
Eosinophils Absolute: 0.1 10*3/uL (ref 0.0–0.5)
HCT: 33.3 % — ABNORMAL LOW (ref 34.8–46.6)
HEMOGLOBIN: 11.2 g/dL — AB (ref 11.6–15.9)
LYMPH%: 15 % (ref 14.0–49.7)
MCH: 29.9 pg (ref 25.1–34.0)
MCHC: 33.6 g/dL (ref 31.5–36.0)
MCV: 89 fL (ref 79.5–101.0)
MONO#: 0.8 10*3/uL (ref 0.1–0.9)
MONO%: 9.6 % (ref 0.0–14.0)
NEUT%: 74.5 % (ref 38.4–76.8)
NEUTROS ABS: 5.8 10*3/uL (ref 1.5–6.5)
PLATELETS: 316 10*3/uL (ref 145–400)
RBC: 3.74 10*6/uL (ref 3.70–5.45)
RDW: 15 % — ABNORMAL HIGH (ref 11.2–14.5)
WBC: 7.8 10*3/uL (ref 3.9–10.3)
lymph#: 1.2 10*3/uL (ref 0.9–3.3)

## 2017-05-12 LAB — COMPREHENSIVE METABOLIC PANEL
ALBUMIN: 3.5 g/dL (ref 3.5–5.0)
ALK PHOS: 91 U/L (ref 40–150)
ALT: 10 U/L (ref 0–55)
ANION GAP: 11 meq/L (ref 3–11)
AST: 40 U/L — ABNORMAL HIGH (ref 5–34)
BILIRUBIN TOTAL: 0.5 mg/dL (ref 0.20–1.20)
BUN: 7 mg/dL (ref 7.0–26.0)
CO2: 24 mEq/L (ref 22–29)
Calcium: 10.2 mg/dL (ref 8.4–10.4)
Chloride: 101 mEq/L (ref 98–109)
Creatinine: 0.8 mg/dL (ref 0.6–1.1)
Glucose: 121 mg/dl (ref 70–140)
POTASSIUM: 3.2 meq/L — AB (ref 3.5–5.1)
Sodium: 137 mEq/L (ref 136–145)
TOTAL PROTEIN: 8.6 g/dL — AB (ref 6.4–8.3)

## 2017-05-12 LAB — TSH: TSH: 2.538 m[IU]/L (ref 0.308–3.960)

## 2017-05-12 MED ORDER — MORPHINE SULFATE 4 MG/ML IJ SOLN
4.0000 mg | Freq: Once | INTRAMUSCULAR | Status: DC
  Filled 2017-05-12: qty 1

## 2017-05-12 MED ORDER — PROCHLORPERAZINE MALEATE 10 MG PO TABS: 10.0000 mg | ORAL_TABLET | Freq: Four times a day (QID) | ORAL | 0 refills | Status: DC | PRN

## 2017-05-12 MED ORDER — ONDANSETRON HCL 4 MG/2ML IJ SOLN: 8.0000 mg | Freq: Once | INTRAMUSCULAR | Status: DC

## 2017-05-12 MED ORDER — SODIUM CHLORIDE 0.9 % IV SOLN: Freq: Once | INTRAVENOUS | Status: DC

## 2017-05-12 MED ORDER — PROCHLORPERAZINE MALEATE 10 MG PO TABS
10.0000 mg | ORAL_TABLET | ORAL | Status: AC
  Administered 2017-05-12: 10 mg via ORAL

## 2017-05-12 MED ORDER — MORPHINE SULFATE (PF) 4 MG/ML IV SOLN
INTRAVENOUS | Status: AC
  Filled 2017-05-12: qty 1

## 2017-05-12 MED ORDER — SODIUM CHLORIDE 0.9 % IV SOLN: INTRAVENOUS | Status: DC

## 2017-05-12 MED ORDER — PROCHLORPERAZINE MALEATE 10 MG PO TABS
ORAL_TABLET | ORAL | Status: AC
  Filled 2017-05-12: qty 1

## 2017-05-12 MED ORDER — OXYCODONE HCL 5 MG PO TABS: 5.0000 mg | ORAL_TABLET | Freq: Four times a day (QID) | ORAL | 0 refills | Status: DC | PRN

## 2017-05-12 MED ORDER — MORPHINE SULFATE 4 MG/ML IJ SOLN
4.0000 mg | Freq: Once | INTRAMUSCULAR | Status: AC
  Administered 2017-05-12: 4 mg via SUBCUTANEOUS
  Filled 2017-05-12: qty 1

## 2017-05-12 MED ORDER — DOCUSATE SODIUM 100 MG PO CAPS: 100.0000 mg | ORAL_CAPSULE | Freq: Two times a day (BID) | ORAL | 1 refills | Status: DC

## 2017-05-12 NOTE — Progress Notes (Signed)
[facility] Follow up:    Theresa Eagles, PA-C [place] [address]   DIAGNOSIS: Cancer Staging Non-small cell carcinoma of lung, stage 3 ([place]) Staging form: Lung, AJCC 7th Edition - Clinical stage from 08/08/2015: Stage IIIA (T2b, N2, M0) - Signed by Curt Bears, MD on 08/08/2015 Metastatic non-small cell lung cancer initially diagnosed as Stage IIIA (T2b, N2, M0) non-small cell lung cancer, invasive poorly differentiated squamous cell carcinoma diagnosed in November 2016 and presented with large right hilar mass with collapse of the right middle lobe and extension into the right upper lobe and right lower lobe along the hilum with right paratracheal and subcarinal lymphadenopathy. She presented in July 2018 with metastatic intramuscular mass in the right posterior lateral chest wall, with biopsy confirmed squamous cell carcinoma. PDL1 Expression: 50%.  SUMMARY OF ONCOLOGIC HISTORY: Oncology History   Patient presented in 05/2015 with cold like symptoms to ED.  Work up showed right lung mass.  Non-small cell carcinoma of lung, stage 3 ([facility])   Staging form: Lung, AJCC 7th Edition     Clinical stage from 08/08/2015: Stage IIIA (T2b, N2, M0) - Signed by Curt Bears, MD on 08/08/2015       Non-small cell carcinoma of lung, stage 3 ([place])   07/06/2015 Imaging    CXR IMPRESSION: Right middle lobe consolidation and an element of volume loss. Underlying malignancy cannot be excluded      07/12/2015 Imaging    CTA IMPRESSION: No evidence of pulmonary embolism.  6.1 x 4.1 cm posterior right upper lobe mass extending to the right middle lobe and right hilar region. Associated postobstructive opacity in the right upper and middle lobes.       07/22/2015 Imaging    CT Neck IMPRESSION: 1. Slight prominence of the tonsils without an abscess or mass. 2. No adenopathy in the neck.      08/02/2015 Surgery    PROCEDURE:  Bronchoscopy with brushings and  biopsies Endobronchial ultrasound with mediastinal lymph node aspirations.       08/02/2015 Pathology Results    Bronchus, biopsy, right upper lobe - INVASIVE POORLY DIFFERENTIATED SQUAMOUS CELL CARCINOMA      08/07/2015 Imaging    PET IMPRESSION:  6 cm right hilar mass with collapse of the right middle lobe and probable extension of mass into the right upper lobe and right lower lobe along the hilum, maximum standard uptake value 22.9, compatible with malignancy.       08/07/2015 Imaging    MRI Brain IMPRESSION: Mild atrophy. Minor white matter disease, nonspecific.  No acute intracranial findings.      08/08/2015 Initial Diagnosis    Non-small cell carcinoma of lung, stage 3 ([place])      08/09/2015 -  Radiation Therapy    SIM      08/19/2015 -  Chemotherapy    1st chemo      09/16/2015 Imaging    CTA IMPRESSION: 1. Negative for acute pulmonary embolus, pneumonia or other acute cardiopulmonary process. 2. Interval response to therapy with significantly decreased size of medial right upper lobe/right perihilar pulmonary mass       PRIOR THERAPY:  1) Concurrent chemoradiation with weekly carboplatin for AUC of 2 and paclitaxel 45 MG/M2. First dose 08/19/2015. Status post 5 cycles. Last cycle was given 10/07/2015 with significant response. 2) Consolidation chemotherapy with carboplatin for AUC of 5 and paclitaxel 175 MG/M2 every 3 weeks with Neulasta support. First dose 11/18/2015. Status post 3 cycles. 3)  palliative radiotherapy to the intramuscular mass in the right posterior lateral chest wall under the care of Dr. Tammi Klippel.  CURRENT THERAPY: Keytruda 200 mg IV every 3 weeks. First dose 05/12/2017.  INTERVAL HISTORY: Theresa [name] 56 y.o. female returns for routine follow-up with her daughter. The patient is not feeling very well today. She continues to have significant pain to the mass on her back. She is scheduled to begin radiation later today to this area. The patient  was supposed to have a fentanyl patch on, but she took her off her she did not think it was helping. She is taking hydrocodone 2 tablets every 4-6 hours. Patient is also complaining of constipation and has not had a bowel movement about 3 days. This is causing her to have some nausea and she is vomiting intermittently. States she has not had any anti-emetics at home and is not currently on a bowel regimen. Denies fevers and chills. Denies chest pain, shortness of breath, cough, hemoptysis. The patient is here for evaluation today prior to cycle 1 of Keytruda.   Patient Active Problem List   Diagnosis Date Noted  . Encounter for antineoplastic immunotherapy 05/12/2017  . Metastatic primary lung cancer, right ([place]) 05/03/2017  . Dehydration 10/11/2015  . Nausea with vomiting 10/11/2015  . Chemotherapy induced neutropenia ([place]) 09/30/2015  . Hypokalemia 09/30/2015  . 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 Antineoplastic chemotherapy induced pancytopenia ([place]) 09/18/2015  . Constipation 09/18/2015  . Intractable nausea and vomiting 09/18/2015  . Typical atrial flutter ([place])   . Malignant neoplasm of overlapping sites of right lung ([place])   . Malnutrition of moderate degree 09/17/2015  . Chest pain 09/16/2015  . Tachycardia 09/16/2015  . Pain in the chest   . Encounter for antineoplastic chemotherapy 08/27/2015  . Non-small cell carcinoma of lung, stage 3 ([place]) 08/08/2015  . Lung mass 07/16/2015  . Malignant neoplasm of cervix ([place]) 10/08/2011    has No Known Allergies.  MEDICAL HISTORY: Past Medical History:  Diagnosis Date  . Cancer ([place])    5 years ago - cervical cancer  . Encounter for antineoplastic chemotherapy 08/27/2015  . History of radiation therapy 08/19/2015 - 10/07/2015   Site/dose:   The patient's primary tumor and involved lymph nodes were treated to 66 Gy in 30 fractions.  . Hypokalemia 09/30/2015  . Non-small cell carcinoma of lung, stage 3 ([place]) 08/08/2015  .  Paroxysmal atrial flutter ([place]) 09/16/2015  . Pneumonia     SURGICAL HISTORY: Past Surgical History:  Procedure Laterality Date  . TUBAL LIGATION    . VIDEO BRONCHOSCOPY WITH ENDOBRONCHIAL ULTRASOUND N/A 08/02/2015   Procedure: VIDEO BRONCHOSCOPY WITH ENDOBRONCHIAL ULTRASOUND;  Surgeon: [name], MD;  Location: [facility];  Service: Thoracic;  Laterality: N/A;  . WISDOM TOOTH EXTRACTION      SOCIAL HISTORY: Social History   Social History  . Marital status: Single    Spouse name: N/A  . Number of children: N/A  . Years of education: N/A   Occupational History  . Not on file.   Social History Main Topics  . Smoking status: Former Smoker    Packs/day: 0.30    Years: 5.00    Types: Cigarettes    Quit date: 07/31/1990  . Smokeless tobacco: Never Used  . Alcohol use No  . Drug use: No  . Sexual activity: Not Currently   Other Topics Concern  . Not on file   Social History Narrative  . No narrative on file  FAMILY HISTORY: Family History  Problem Relation Age of Onset  . COPD Mother   . Diabetes type II Mother   . Heart disease Mother   . High Cholesterol Mother   . Lung cancer Father     Review of Systems  Constitutional: Positive for appetite change and fatigue. Negative for chills and fever.  HENT:  Negative.   Eyes: Negative.   Respiratory: Negative.   Cardiovascular: Negative.   Gastrointestinal: Positive for constipation, nausea and vomiting. Negative for diarrhea.  Endocrine: Negative.   Genitourinary: Negative.    Musculoskeletal: Positive for back pain. Negative for neck pain and neck stiffness.  Skin: Negative.   Neurological: Negative.   Hematological: Negative.   Psychiatric/Behavioral: Negative.       PHYSICAL EXAMINATION  ECOG PERFORMANCE STATUS: 1 - Symptomatic but completely ambulatory  Vitals:   05/12/17 0838  BP: (!) 144/129  Pulse: (!) 125  Resp: (!) 21  Temp: 98.8 F (37.1 C)  SpO2: 100%  repeat blood pressure  was 135/87 and repeat heart rate was 114  Physical Exam  Constitutional: She is oriented to person, place, and time and well-developed, well-nourished, and in no distress. No distress.  HENT:  Head: Normocephalic.  Mouth/Throat: Oropharynx is clear and moist. No oropharyngeal exudate.  Eyes: Conjunctivae are normal. No scleral icterus.  Neck: Normal range of motion. Neck supple.  Cardiovascular: Regular rhythm, normal heart sounds and intact distal pulses.   tachycardic  Pulmonary/Chest: Effort normal and breath sounds normal. No respiratory distress. She has no wheezes. She has no rales.  Diminished breath sounds right base.  Abdominal: Soft. Bowel sounds are normal. She exhibits no distension and no mass. There is no tenderness.  Musculoskeletal: Normal range of motion. She exhibits no edema.  Lymphadenopathy:    She has no cervical adenopathy.  Neurological: She is alert and oriented to person, place, and time. She exhibits normal muscle tone. Gait normal.  Skin: Skin is warm and dry. No rash noted. She is not diaphoretic. No erythema. No pallor.  Psychiatric: Mood, memory, affect and judgment normal.  Vitals reviewed. examination of the back shows a palpable 14 cm mass above the right scapula  LABORATORY DATA:  CBC    Component Value Date/Time   WBC 7.8 05/12/2017 0807   WBC 8.2 04/19/2017 1207   RBC 3.74 05/12/2017 0807   RBC 3.79 (L) 04/19/2017 1207   HGB 11.2 (L) 05/12/2017 0807   HCT 33.3 (L) 05/12/2017 0807   PLT 316 05/12/2017 0807   PLT 300 02/11/2017 1632   MCV 89.0 05/12/2017 0807   MCH 29.9 05/12/2017 0807   MCH 30.6 04/19/2017 1207   MCHC 33.6 05/12/2017 0807   MCHC 34.9 04/19/2017 1207   RDW 15.0 (H) 05/12/2017 0807   LYMPHSABS 1.2 05/12/2017 0807   MONOABS 0.8 05/12/2017 0807   EOSABS 0.1 05/12/2017 0807   BASOSABS 0.0 05/12/2017 0807    CMP     Component Value Date/Time   NA 137 05/12/2017 0807   K 3.2 (L) 05/12/2017 0807   CL 99 (L) 04/19/2017  1207   CO2 24 05/12/2017 0807   GLUCOSE 121 05/12/2017 0807   BUN 7.0 05/12/2017 0807   CREATININE 0.8 05/12/2017 0807   CALCIUM 10.2 05/12/2017 0807   PROT 8.6 (H) 05/12/2017 0807   ALBUMIN 3.5 05/12/2017 0807   AST 40 (H) 05/12/2017 0807   ALT 10 05/12/2017 0807   ALKPHOS 91 05/12/2017 0807   BILITOT 0.50 05/12/2017 0807   GFRNONAA >  60 04/19/2017 1207   GFRAA >60 04/19/2017 1207    RADIOGRAPHIC STUDIES:  No results found. EXAM: MRI HEAD WITHOUT CONTRAST  TECHNIQUE: Multiplanar, multiecho pulse sequences of the brain and surrounding structures were obtained without intravenous contrast.  COMPARISON:  08/07/2015  FINDINGS: The patient could not tolerate complete scanning because of pain. No contrast was administered.  Brain: Has a image to, the brain does not show any abnormality. No sign of atrophy, old or acute small or large vessel infarction, mass lesion, hemorrhage, hydrocephalus or extra-axial collection. No pituitary mass.  Vascular: Major vessels at the base of the brain show flow.  Skull and upper cervical spine: No osseous lesions seen.  Sinuses/Orbits: Clear/normal  Other: None  IMPRESSION: Examination terminated because of patient pain. No contrast was administered. No abnormality is demonstrated. No evidence of ischemic disease or metastatic disease.   Electronically Signed   By: Nelson Chimes M.D.   On: 05/04/2017 08:41  EXAM: NUCLEAR MEDICINE PET SKULL BASE TO THIGH  TECHNIQUE: 11 point for mCi F-18 FDG was injected intravenously. Full-ring PET imaging was performed from the skull base to thigh after the radiotracer. CT data was obtained and used for attenuation correction and anatomic localization.  FASTING BLOOD GLUCOSE:  Value: 114 mg/dl  COMPARISON:  Multiple exams, including 08/07/2015 and prior chest CT of 04/19/2017  FINDINGS: NECK  Accentuated posterior nasopharyngeal activity is likely physiologic and is  increased from prior, maximum SUV 7.4.  Bilateral accentuated pharyngeal tonsillar activity, maximum SUV 12.6 on the right (formerly 11.1) and 10.1 on the left (formerly 9.4).  Physiologic glottic activity noted.  CHEST  Hypermetabolic mass along the right posterior chest wall deep to the latissimus dorsi muscle and extending into the right pleural effusion and pleural space, total size of the hypermetabolic involvement 80.8 by 7.2 cm, maximum SUV 18.9. This hypermetabolic activity extends through the adjacent intercostal spaces and is new compared to prior.  Hypermetabolic foci along the right upper pleura. An indistinct right hilar lymph node is significantly smaller than previous but has a maximum standard uptake value of 9 point 2, the conglomerate right hilar mass previously had a maximum SUV of 22.9. Right perihilar consolidation probably from radiation therapy. Prior subcarinal hypermetabolic adenopathy has resolved. Prior extensive right paratracheal adenopathy has resolved. Lower thoracic para-aortic adenopathy near the level of the hiatus observed with individual lymph nodes measuring up to 0.8 cm in short axis and with maximum SUV 11.9, new compared to the prior exam.  ABDOMEN/PELVIS  No abnormal hypermetabolic activity within the liver, pancreas, adrenal glands, or spleen. No hypermetabolic lymph nodes in the abdomen or pelvis.  Small hypodense lesions in the dome of the liver are stable and not hypermetabolic.  SKELETON  No focal hypermetabolic activity to suggest skeletal metastasis.  IMPRESSION: 1. Although the primary tumor and original thoracic adenopathy are improved, there is unfortunately new pleural metastatic disease on the right with multiple foci including a large tumor along the pleura of the right costophrenic angle which extends out through multiple intercostal spaces into the right chest wall, creating a large conglomerate tumor  deep to the right latissimus dorsi muscle, compatible with extensive chest wall invasion. There is a residual hypermetabolic right hilar lymph node along with some new hypermetabolic adenopathy adjacent to the distal esophagus and distal thoracic aorta at the hiatus. 2. Bilateral but relatively symmetric palatine tonsillar activity, probably physiologic.   Electronically Signed   By: Van Clines M.D.   On: 05/05/2017 10:46  ASSESSMENT and THERAPY PLAN:   Metastatic primary lung cancer, right [facility]) This is a very pleasant 56 year old African-American female with metastatic non-small cell lung cancer that was initially diagnosed as stage IIIa non-small cell lung cancer, squamous cell carcinoma presented with large right hilar mass and mediastinal lymphadenopathy diagnosed in November 2016. She status post concurrent chemoradiation followed by consolidation chemotherapy. The patient was found recently to have intramuscular metastases and the right posterior chest wall that was biopsy-proven to be squamous cell carcinoma with PDL 1 expression 50%.  The patient was seen with Dr. Julien Nordmann today. Recommend for the patient to proceed with the palliative radiotherapy to the posterior chest for lung mass under the care of Dr. Tammi Klippel. He is scheduled to begin this today.  Recommend that she proceed with cycle 1 of her treatment with immunotherapy with Keytruda (pembrolizumab) 200 mg IV every 3 weeks.  For pain management, this is currently not well controlled. Recommend that she restart her fentanyl patch 50 g/hour every 3 days. We will change her hydrocodone to oxycodone for breakthrough pain. A new prescription was given today. She is also expected to have better pain control after the palliative radiotherapy.  For the tachycardia, she will receive 1 L of normal saline in addition to morphine sulfate intravenously in the clinic today.  Follow-up visit will be in 3 weeks for valuation  prior to cycle 2 of Keytruda.  Nausea with vomiting The patient is having nausea with vomiting here in our office today.She will receive Zofran 8 mg IV. A prescription for Compazine was sent to her local pharmacy since she does not have any antiemetics at home.  Constipation The patient has constipation and is not on a bowel regimen. She was instructed to take half bottle of milk of magnesia today. She will then begin MiraLAX1 capful mixed in water daily along with Colace 1 tablet twice a day.   No orders of the defined types were placed in this encounter.   All questions were answered. The patient knows to call the clinic with any problems, questions or concerns. We can certainly see the patient much sooner if necessary.  Mikey Bussing, NP 05/12/2017   ADDENDUM: Hematology/Oncology Attending: I had a face to face encounter with the patient today. I recommended her care plan. This is a very pleasant 56 years old African-American female with metastatic non-small cell lung cancer, squamous cell carcinoma and PDL 1 expression of 50%. The patient was initially treated for stage III non-small cell lung cancer with concurrent chemoradiation followed by consolidation chemotherapy and has been doing fine until the recent disease progression with significant mass in the posterior chest wall that was biopsy-proven to be metastatic squamous cell carcinoma. She is here today to start the first cycle of treatment with immunotherapy with [place] (pembrolizumab). The patient is in severe pain and currently on treatment with fentanyl patch as well as hydrocodone with minimal control of her pain. We will arrange for the patient to receive 1 L of normal saline for the tachycardia and dehydration today in addition to morphine sulfate. In the chemotherapy room she was unable to receive the IV pain medication and fluid because of poor IV access. She received her morphine subcutaneously. We will arrange for the  patient to have a Port-A-Cath placed before starting the first dose of her treatment with immunotherapy which will be delayed until next week. The patient will continue with her planned palliative radiotherapy and she is expected to start the first  fraction of her treatment today. For nausea and vomiting she was given Zofran orally today. For constipation she was advised to continue on MiraLAX and also to use magnesium citrate if needed. The patient would come back for follow-up visit next week for reevaluation and management of her condition. She was advised to call immediately if she has any concerning symptoms in the interval.  Disclaimer: This note was dictated with voice recognition software. Similar sounding words can inadvertently be transcribed and may be missed upon review. Eilleen Kempf, MD 05/12/17

## 2017-05-12 NOTE — Assessment & Plan Note (Signed)
The patient has constipation and is not on a bowel regimen. She was instructed to take half bottle of milk of magnesia today. She will then begin MiraLAX1 capful mixed in water daily along with Colace 1 tablet twice a day.

## 2017-05-12 NOTE — Patient Instructions (Addendum)
Take 1/2 bottle of milk of magnesia today.  Then begin: Take MiraLax 1 capful mixed in water daily. Use stool softener twice a day.

## 2017-05-12 NOTE — Assessment & Plan Note (Signed)
The patient is having nausea with vomiting here in our office today.She will receive Zofran 8 mg IV. A prescription for Compazine was sent to her local pharmacy since she does not have any antiemetics at home.

## 2017-05-12 NOTE — Assessment & Plan Note (Addendum)
This is a very pleasant 57 year old African-American female with metastatic non-small cell lung cancer that was initially diagnosed as stage IIIa non-small cell lung cancer, squamous cell carcinoma presented with large right hilar mass and mediastinal lymphadenopathy diagnosed in November 2016. She status post concurrent chemoradiation followed by consolidation chemotherapy. The patient was found recently to have intramuscular metastases and the right posterior chest wall that was biopsy-proven to be squamous cell carcinoma with PDL 1 expression 50%.  The patient was seen with Dr. Julien Nordmann today. Recommend for the patient to proceed with the palliative radiotherapy to the posterior chest for lung mass under the care of Dr. Tammi Klippel. He is scheduled to begin this today.  Recommend that she proceed with cycle 1 of her treatment with immunotherapy with Keytruda (pembrolizumab) 200 mg IV every 3 weeks.  For pain management, this is currently not well controlled. Recommend that she restart her fentanyl patch 50 g/hour every 3 days. We will change her hydrocodone to oxycodone for breakthrough pain. A new prescription was given today. She is also expected to have better pain control after the palliative radiotherapy.  For the tachycardia, she will receive 1 L of normal saline in addition to morphine sulfate intravenously in the clinic today.  Follow-up visit will be in 3 weeks for valuation prior to cycle 2 of Keytruda.

## 2017-05-12 NOTE — Telephone Encounter (Signed)
No 8/22 los.

## 2017-05-12 NOTE — Progress Notes (Unsigned)
Pt here for first chemo.  Attempted IV sticks several times by Ninfa Meeker, RN,  M. Wilhemina Cash, RN,  And  D. Gloriann Loan, RN unsuccessfully.  Morphine 4 mg given SQ per Dr. Julien Nordmann for excruciating pain in back.   Pt went to radiation without chemo today.  Clydia Llano, collaborative nurse to arrange for portacath insertion.   Pt stated some relief of pain at 1115 am - evidenced by no facial grimacing, and no groaning.

## 2017-05-13 ENCOUNTER — Ambulatory Visit
Admission: RE | Admit: 2017-05-13 | Discharge: 2017-05-13 | Disposition: A | Discharge status: Home / Self Care | Payer: Medicare Other | Source: Ambulatory Visit | Attending: Radiation Oncology | Admitting: Radiation Oncology

## 2017-05-13 ENCOUNTER — Telehealth: Payer: Self-pay | Admitting: Internal Medicine

## 2017-05-13 DIAGNOSIS — Z510 Encounter for antineoplastic radiation therapy: Secondary | ICD-10-CM | POA: Diagnosis not present

## 2017-05-13 DIAGNOSIS — C7989 Secondary malignant neoplasm of other specified sites: Secondary | ICD-10-CM | POA: Diagnosis not present

## 2017-05-13 DIAGNOSIS — Z87891 Personal history of nicotine dependence: Secondary | ICD-10-CM | POA: Diagnosis not present

## 2017-05-13 DIAGNOSIS — C3491 Malignant neoplasm of unspecified part of right bronchus or lung: Secondary | ICD-10-CM | POA: Diagnosis not present

## 2017-05-13 DIAGNOSIS — C342 Malignant neoplasm of middle lobe, bronchus or lung: Secondary | ICD-10-CM | POA: Diagnosis not present

## 2017-05-13 DIAGNOSIS — I4892 Unspecified atrial flutter: Secondary | ICD-10-CM | POA: Diagnosis not present

## 2017-05-13 DIAGNOSIS — E876 Hypokalemia: Secondary | ICD-10-CM | POA: Diagnosis not present

## 2017-05-13 NOTE — Telephone Encounter (Signed)
Spoke with patient regarding the addition of her appts on 9/6.

## 2017-05-14 ENCOUNTER — Telehealth: Payer: Self-pay | Admitting: Medical Oncology

## 2017-05-14 ENCOUNTER — Ambulatory Visit
Admission: RE | Admit: 2017-05-14 | Discharge: 2017-05-14 | Disposition: A | Discharge status: Home / Self Care | Payer: Medicare Other | Source: Ambulatory Visit | Attending: Radiation Oncology | Admitting: Radiation Oncology

## 2017-05-14 DIAGNOSIS — C3491 Malignant neoplasm of unspecified part of right bronchus or lung: Secondary | ICD-10-CM | POA: Diagnosis not present

## 2017-05-14 DIAGNOSIS — C7989 Secondary malignant neoplasm of other specified sites: Secondary | ICD-10-CM | POA: Diagnosis not present

## 2017-05-14 DIAGNOSIS — C342 Malignant neoplasm of middle lobe, bronchus or lung: Secondary | ICD-10-CM | POA: Diagnosis not present

## 2017-05-14 DIAGNOSIS — E876 Hypokalemia: Secondary | ICD-10-CM | POA: Diagnosis not present

## 2017-05-14 DIAGNOSIS — I4892 Unspecified atrial flutter: Secondary | ICD-10-CM | POA: Diagnosis not present

## 2017-05-14 DIAGNOSIS — Z510 Encounter for antineoplastic radiation therapy: Secondary | ICD-10-CM | POA: Diagnosis not present

## 2017-05-14 DIAGNOSIS — Z87891 Personal history of nicotine dependence: Secondary | ICD-10-CM | POA: Diagnosis not present

## 2017-05-14 MED ORDER — RADIAPLEXRX EX GEL: Freq: Once | CUTANEOUS | Status: DC

## 2017-05-14 MED ORDER — RADIAPLEXRX EX GEL
Freq: Once | CUTANEOUS | Status: AC
Start: 2017-05-14 — End: 2017-05-14
  Administered 2017-05-14: 18:00:00 via TOPICAL

## 2017-05-14 NOTE — Telephone Encounter (Signed)
Hypokalemia-left message for pt - Add  K+ rich foods to diet including: bananas,oranges, avocado, squash, raisins, potatoes, cabbage,cauliflower, tuna,honey,beef,chicken,sardines,milk,turkey,

## 2017-05-17 ENCOUNTER — Ambulatory Visit
Admission: RE | Admit: 2017-05-17 | Discharge: 2017-05-17 | Disposition: A | Discharge status: Home / Self Care | Payer: Medicare Other | Source: Ambulatory Visit | Attending: Radiation Oncology | Admitting: Radiation Oncology

## 2017-05-17 DIAGNOSIS — E876 Hypokalemia: Secondary | ICD-10-CM | POA: Diagnosis not present

## 2017-05-17 DIAGNOSIS — C7989 Secondary malignant neoplasm of other specified sites: Secondary | ICD-10-CM | POA: Diagnosis not present

## 2017-05-17 DIAGNOSIS — Z87891 Personal history of nicotine dependence: Secondary | ICD-10-CM | POA: Diagnosis not present

## 2017-05-17 DIAGNOSIS — I4892 Unspecified atrial flutter: Secondary | ICD-10-CM | POA: Diagnosis not present

## 2017-05-17 DIAGNOSIS — Z510 Encounter for antineoplastic radiation therapy: Secondary | ICD-10-CM | POA: Diagnosis not present

## 2017-05-17 DIAGNOSIS — C342 Malignant neoplasm of middle lobe, bronchus or lung: Secondary | ICD-10-CM | POA: Diagnosis not present

## 2017-05-17 DIAGNOSIS — C3491 Malignant neoplasm of unspecified part of right bronchus or lung: Secondary | ICD-10-CM | POA: Diagnosis not present

## 2017-05-18 ENCOUNTER — Ambulatory Visit
Admission: RE | Admit: 2017-05-18 | Discharge: 2017-05-18 | Disposition: A | Discharge status: Home / Self Care | Payer: Medicare Other | Source: Ambulatory Visit | Attending: Radiation Oncology | Admitting: Radiation Oncology

## 2017-05-18 DIAGNOSIS — C3491 Malignant neoplasm of unspecified part of right bronchus or lung: Secondary | ICD-10-CM

## 2017-05-19 ENCOUNTER — Ambulatory Visit
Admission: RE | Admit: 2017-05-19 | Discharge: 2017-05-19 | Disposition: A | Discharge status: Home / Self Care | Payer: Medicare Other | Source: Ambulatory Visit | Attending: Radiation Oncology | Admitting: Radiation Oncology

## 2017-05-19 ENCOUNTER — Other Ambulatory Visit: Payer: Self-pay | Admitting: Radiology

## 2017-05-19 DIAGNOSIS — Z510 Encounter for antineoplastic radiation therapy: Secondary | ICD-10-CM | POA: Diagnosis not present

## 2017-05-19 DIAGNOSIS — E876 Hypokalemia: Secondary | ICD-10-CM | POA: Diagnosis not present

## 2017-05-19 DIAGNOSIS — C7989 Secondary malignant neoplasm of other specified sites: Secondary | ICD-10-CM | POA: Diagnosis not present

## 2017-05-19 DIAGNOSIS — Z87891 Personal history of nicotine dependence: Secondary | ICD-10-CM | POA: Diagnosis not present

## 2017-05-19 DIAGNOSIS — I4892 Unspecified atrial flutter: Secondary | ICD-10-CM | POA: Diagnosis not present

## 2017-05-19 DIAGNOSIS — C3491 Malignant neoplasm of unspecified part of right bronchus or lung: Secondary | ICD-10-CM | POA: Diagnosis not present

## 2017-05-19 DIAGNOSIS — C342 Malignant neoplasm of middle lobe, bronchus or lung: Secondary | ICD-10-CM | POA: Diagnosis not present

## 2017-05-20 ENCOUNTER — Other Ambulatory Visit: Payer: Self-pay | Admitting: Radiology

## 2017-05-20 ENCOUNTER — Ambulatory Visit
Admission: RE | Admit: 2017-05-20 | Discharge: 2017-05-20 | Disposition: A | Discharge status: Home / Self Care | Payer: Medicare Other | Source: Ambulatory Visit | Attending: Radiation Oncology | Admitting: Radiation Oncology

## 2017-05-20 DIAGNOSIS — Z87891 Personal history of nicotine dependence: Secondary | ICD-10-CM | POA: Diagnosis not present

## 2017-05-20 DIAGNOSIS — C342 Malignant neoplasm of middle lobe, bronchus or lung: Secondary | ICD-10-CM | POA: Diagnosis not present

## 2017-05-20 DIAGNOSIS — I4892 Unspecified atrial flutter: Secondary | ICD-10-CM | POA: Diagnosis not present

## 2017-05-20 DIAGNOSIS — C7989 Secondary malignant neoplasm of other specified sites: Secondary | ICD-10-CM | POA: Diagnosis not present

## 2017-05-20 DIAGNOSIS — E876 Hypokalemia: Secondary | ICD-10-CM | POA: Diagnosis not present

## 2017-05-20 DIAGNOSIS — Z510 Encounter for antineoplastic radiation therapy: Secondary | ICD-10-CM | POA: Diagnosis not present

## 2017-05-20 DIAGNOSIS — C3491 Malignant neoplasm of unspecified part of right bronchus or lung: Secondary | ICD-10-CM | POA: Diagnosis not present

## 2017-05-21 ENCOUNTER — Ambulatory Visit
Admission: RE | Admit: 2017-05-21 | Discharge: 2017-05-21 | Disposition: A | Discharge status: Home / Self Care | Payer: Medicare Other | Source: Ambulatory Visit | Attending: Radiation Oncology | Admitting: Radiation Oncology

## 2017-05-21 ENCOUNTER — Ambulatory Visit (HOSPITAL_COMMUNITY)
Admission: RE | Admit: 2017-05-21 | Discharge: 2017-05-21 | Disposition: A | Discharge status: Home / Self Care | Payer: Medicare Other | Source: Ambulatory Visit | Attending: Internal Medicine | Admitting: Internal Medicine

## 2017-05-21 ENCOUNTER — Encounter (HOSPITAL_COMMUNITY): Payer: Self-pay

## 2017-05-21 ENCOUNTER — Other Ambulatory Visit: Payer: Self-pay | Admitting: Internal Medicine

## 2017-05-21 DIAGNOSIS — C3490 Malignant neoplasm of unspecified part of unspecified bronchus or lung: Secondary | ICD-10-CM | POA: Diagnosis not present

## 2017-05-21 DIAGNOSIS — I4892 Unspecified atrial flutter: Secondary | ICD-10-CM | POA: Insufficient documentation

## 2017-05-21 DIAGNOSIS — I878 Other specified disorders of veins: Secondary | ICD-10-CM

## 2017-05-21 DIAGNOSIS — C3491 Malignant neoplasm of unspecified part of right bronchus or lung: Secondary | ICD-10-CM | POA: Diagnosis not present

## 2017-05-21 DIAGNOSIS — C342 Malignant neoplasm of middle lobe, bronchus or lung: Secondary | ICD-10-CM | POA: Diagnosis not present

## 2017-05-21 DIAGNOSIS — Z5111 Encounter for antineoplastic chemotherapy: Secondary | ICD-10-CM | POA: Diagnosis not present

## 2017-05-21 DIAGNOSIS — E876 Hypokalemia: Secondary | ICD-10-CM | POA: Diagnosis not present

## 2017-05-21 DIAGNOSIS — C7989 Secondary malignant neoplasm of other specified sites: Secondary | ICD-10-CM | POA: Diagnosis not present

## 2017-05-21 DIAGNOSIS — Z87891 Personal history of nicotine dependence: Secondary | ICD-10-CM | POA: Diagnosis not present

## 2017-05-21 DIAGNOSIS — Z510 Encounter for antineoplastic radiation therapy: Secondary | ICD-10-CM | POA: Diagnosis not present

## 2017-05-21 HISTORY — PX: IR FLUORO GUIDE PORT INSERTION RIGHT: IMG5741

## 2017-05-21 HISTORY — PX: IR US GUIDE VASC ACCESS RIGHT: IMG2390

## 2017-05-21 MED ORDER — LIDOCAINE HCL (PF) 1 % IJ SOLN
INTRAMUSCULAR | Status: AC
  Filled 2017-05-21: qty 30

## 2017-05-21 MED ORDER — MIDAZOLAM HCL 2 MG/2ML IJ SOLN
INTRAMUSCULAR | Status: AC
  Filled 2017-05-21: qty 4

## 2017-05-21 MED ORDER — HEPARIN SOD (PORK) LOCK FLUSH 100 UNIT/ML IV SOLN
INTRAVENOUS | Status: AC
  Filled 2017-05-21: qty 5

## 2017-05-21 MED ORDER — LIDOCAINE HCL (PF) 1 % IJ SOLN
INTRAMUSCULAR | Status: AC | PRN
  Administered 2017-05-21: 10 mL

## 2017-05-21 MED ORDER — SODIUM CHLORIDE 0.9 % IV SOLN
INTRAVENOUS | Status: DC
  Administered 2017-05-21: 13:00:00 via INTRAVENOUS

## 2017-05-21 MED ORDER — MIDAZOLAM HCL 2 MG/2ML IJ SOLN
INTRAMUSCULAR | Status: AC | PRN
  Administered 2017-05-21: 1 mg via INTRAVENOUS
  Administered 2017-05-21: 0.5 mg via INTRAVENOUS
  Administered 2017-05-21 (×2): 1 mg via INTRAVENOUS
  Administered 2017-05-21: 0.5 mg via INTRAVENOUS

## 2017-05-21 MED ORDER — FENTANYL CITRATE (PF) 100 MCG/2ML IJ SOLN
INTRAMUSCULAR | Status: AC | PRN
  Administered 2017-05-21 (×2): 50 ug via INTRAVENOUS

## 2017-05-21 MED ORDER — CEFAZOLIN SODIUM-DEXTROSE 2-4 GM/100ML-% IV SOLN: 2.0000 g | INTRAVENOUS | Status: DC

## 2017-05-21 MED ORDER — FENTANYL CITRATE (PF) 100 MCG/2ML IJ SOLN
INTRAMUSCULAR | Status: AC
  Filled 2017-05-21: qty 4

## 2017-05-21 MED ORDER — CEFAZOLIN SODIUM-DEXTROSE 2-4 GM/100ML-% IV SOLN
INTRAVENOUS | Status: AC
  Administered 2017-05-21: 2000 mg
  Filled 2017-05-21: qty 100

## 2017-05-21 NOTE — Sedation Documentation (Signed)
Patient denies pain and is resting comfortably.  

## 2017-05-21 NOTE — Procedures (Signed)
Interventional Radiology Procedure Note  Procedure: Placement of a right IJ approach single lumen PowerPort.  Tip is positioned at the superior cavoatrial junction and catheter is ready for immediate use.  Complications: No immediate Recommendations:  - Ok to shower tomorrow - Do not submerge for 7 days - Routine line care   Theron Cumbie T. Braileigh Landenberger, M.D Pager:  [phone]   

## 2017-05-21 NOTE — H&P (Signed)
Referring Physician(s): Mohamed,Mohamed  Supervising Physician: Aletta Edouard  Patient Status:  [facility]  Chief Complaint:  "I'm getting a port a cath put in"  Subjective: Patient familiar to IR service from prior right posterior chest wall mass biopsy on 04/15/17. She has a history of metastatic right lung squamous cell carcinoma diagnosed in 2016. She is status post chemoradiation. She is currently undergoing palliative radiotherapy to the posterior right chest wall region. She has poor venous access. She presents again today for Port-A-Cath placement for planned immunotherapy. She currently denies fever, headache, dyspnea, cough, abdominal pain, vomiting or abnormal bleeding. She does have occasional nausea, as well as some right post chest discomfort. Past Medical History:  Diagnosis Date  . Cancer ([place])    5 years ago - cervical cancer  . Encounter for antineoplastic chemotherapy 08/27/2015  . History of radiation therapy 08/19/2015 - 10/07/2015   Site/dose:   The patient's primary tumor and involved lymph nodes were treated to 66 Gy in 30 fractions.  . Hypokalemia 09/30/2015  . Non-small cell carcinoma of lung, stage 3 ([place]) 08/08/2015  . Paroxysmal atrial flutter ([place]) 09/16/2015  . Pneumonia    Past Surgical History:  Procedure Laterality Date  . TUBAL LIGATION    . VIDEO BRONCHOSCOPY WITH ENDOBRONCHIAL ULTRASOUND N/A 08/02/2015   Procedure: VIDEO BRONCHOSCOPY WITH ENDOBRONCHIAL ULTRASOUND;  Surgeon: [name], MD;  Location: [facility];  Service: Thoracic;  Laterality: N/A;  . WISDOM TOOTH EXTRACTION         Allergies: Patient has no known allergies.  Medications: Prior to Admission medications   Medication Sig Start Date End Date Taking? Authorizing Provider  docusate sodium (COLACE) 100 MG capsule Take 1 capsule (100 mg total) by mouth 2 (two) times daily. 05/12/17   Maryanna Shape, NP  fentaNYL (DURAGESIC - DOSED MCG/HR) 50 MCG/HR Place 1 patch (50 mcg  total) onto the skin every 3 (three) days. Patient not taking: Reported on 05/03/2017 05/03/17   Curt Bears, MD  HYDROcodone-acetaminophen (NORCO/VICODIN) 5-325 MG tablet 1 or 2 tabs PO q6 hours prn pain 05/03/17   Curt Bears, MD  linaclotide [facility]) 145 MCG CAPS capsule Take 145 mcg by mouth daily before breakfast.    [provider]  magnesium hydroxide (MILK OF MAGNESIA) 400 MG/5ML suspension Take 5 mLs by mouth at bedtime as needed for mild constipation.    [provider]  ondansetron (ZOFRAN) 4 MG tablet Take 1 tablet (4 mg total) by mouth every 8 (eight) hours as needed for nausea or vomiting. 03/11/17   Francine Graven, DO  oxyCODONE (OXY IR/ROXICODONE) 5 MG immediate release tablet Take 1-2 tablets (5-10 mg total) by mouth every 6 (six) hours as needed for severe pain. 05/12/17   Maryanna Shape, NP  prochlorperazine (COMPAZINE) 10 MG tablet Take 1 tablet (10 mg total) by mouth every 6 (six) hours as needed for nausea or vomiting. 05/12/17   Maryanna Shape, NP  Wound Cleansers (RADIAPLEX EX) Apply topically.    [provider]     Vital Signs: BP 108/81 (BP Location: Right Arm)   Pulse (!) 114   Temp 98.2 F (36.8 C) (Oral)   Resp 16   SpO2 100%   Physical Exam awake, alert. Chest with diminished breath sounds right base, left clear. Large palpable rt post chest mass, tender to palpation; Heart with tachycardic but regular rhythm. Abdomen soft, positive bowel sounds, nontender. No significant lower extremity edema. Imaging: No results found.  Labs:  CBC:  Recent Labs  03/30/17 0834 04/15/17 0601 04/19/17 1207 05/12/17 0807  WBC 5.1 6.0 8.2 7.8  HGB 11.4* 10.9* 11.6* 11.2*  HCT 34.1* 32.5* 33.2* 33.3*  PLT 268 318 327 316    COAGS:  Recent Labs  04/15/17 0601  INR 1.08  APTT 32    BMP:  Recent Labs  12/24/16 1634 02/11/17 1632 03/11/17 1125 03/30/17 0835 04/19/17 1207 05/12/17 0807  NA 136 141 139 143 134*  137  K 3.7 3.8 3.3* 3.9 3.6 3.2*  CL 103 101 104  --  99*  --   CO2 23 23 25 25 24 24   GLUCOSE 123* 124* 130* 111 125* 121  BUN 9 9 10  8.7 8 7.0  CALCIUM 9.6 9.9 9.7 10.0 9.3 10.2  CREATININE 0.97 0.94 0.91 0.9 0.81 0.8  GFRNONAA >60 69 >60  --  >60  --   GFRAA >60 80 >60  --  >60  --     LIVER FUNCTION TESTS:  Recent Labs  03/11/17 1125 03/30/17 0835 04/19/17 1207 05/12/17 0807  BILITOT 0.7 0.63 0.7 0.50  AST 28 29 34 40*  ALT 18 16 20 10   ALKPHOS 87 84 75 91  PROT 8.2* 7.8 8.4* 8.6*  ALBUMIN 4.1 3.8 4.0 3.5    Assessment and Plan: Pt with history of metastatic right lung squamous cell carcinoma (stage IIIA) diagnosed in 2016. She is status post chemoradiation. She is currently undergoing palliative radiotherapy to the posterior right chest wall region. She has poor venous access. She presents again today for Port-A-Cath placement for planned immunotherapy. Risks and benefits discussed with the patient/daughter including, but not limited to bleeding, infection, pneumothorax, or fibrin sheath development and need for additional procedures.All of the patient's questions were answered, patient is agreeable to proceed.Consent signed and in chart.     Electronically Signed: D. Rowe Robert, PA-C 05/21/2017, 12:55 PM   I spent a total of 20 minutes at the the patient's bedside AND on the patient's hospital floor or unit, greater than 50% of which was counseling/coordinating care for Port-A-Cath placement

## 2017-05-21 NOTE — Discharge Instructions (Signed)
Implanted Port Insertion, Care After °This sheet gives you information about how to care for yourself after your procedure. Your health care provider may also give you more specific instructions. If you have problems or questions, contact your health care provider. °What can I expect after the procedure? °After your procedure, it is common to have: °· Discomfort at the port insertion site. °· Bruising on the skin over the port. This should improve over 3-4 days. ° °Follow these instructions at home: °Port care °· After your port is placed, you will get a manufacturer's information card. The card has information about your port. Keep this card with you at all times. °· Take care of the port as told by your health care provider. Ask your health care provider if you or a family member can get training for taking care of the port at home. A home health care nurse may also take care of the port. °· Make sure to remember what type of port you have. °Incision care °· Follow instructions from your health care provider about how to take care of your port insertion site. Make sure you: °? Wash your hands with soap and water before you change your bandage (dressing). If soap and water are not available, use hand sanitizer. °? Change your dressing as told by your health care provider. °? Leave stitches (sutures), skin glue, or adhesive strips in place. These skin closures may need to stay in place for 2 weeks or longer. If adhesive strip edges start to loosen and curl up, you may trim the loose edges. Do not remove adhesive strips completely unless your health care provider tells you to do that. °· Check your port insertion site every day for signs of infection. Check for: °? More redness, swelling, or pain. °? More fluid or blood. °? Warmth. °? Pus or a bad smell. °General instructions °· Do not take baths, swim, or use a hot tub until your health care provider approves. °· Do not lift anything that is heavier than 10 lb (4.5  kg) for a week, or as told by your health care provider. °· Ask your health care provider when it is okay to: °? Return to work or school. °? Resume usual physical activities or sports. °· Do not drive for 24 hours if you were given a medicine to help you relax (sedative). °· Take over-the-counter and prescription medicines only as told by your health care provider. °· Wear a medical alert bracelet in case of an emergency. This will tell any health care providers that you have a port. °· Keep all follow-up visits as told by your health care provider. This is important. °Contact a health care provider if: °· You cannot flush your port with saline as directed, or you cannot draw blood from the port. °· You have a fever or chills. °· You have more redness, swelling, or pain around your port insertion site. °· You have more fluid or blood coming from your port insertion site. °· Your port insertion site feels warm to the touch. °· You have pus or a bad smell coming from the port insertion site. °Get help right away if: °· You have chest pain or shortness of breath. °· You have bleeding from your port that you cannot control. °Summary °· Take care of the port as told by your health care provider. °· Change your dressing as told by your health care provider. °· Keep all follow-up visits as told by your health care provider. °  This information is not intended to replace advice given to you by your health care provider. Make sure you discuss any questions you have with your health care provider. °Document Released: 06/28/2013 Document Revised: 07/29/2016 Document Reviewed: 07/29/2016 °Elsevier Interactive Patient Education © 2017 [employer] °Moderate Conscious Sedation, Adult, Care After °These instructions provide you with information about caring for yourself after your procedure. Your health care provider may also give you more specific instructions. Your treatment has been planned according to current medical  practices, but problems sometimes occur. Call your health care provider if you have any problems or questions after your procedure. °What can I expect after the procedure? °After your procedure, it is common: °· To feel sleepy for several hours. °· To feel clumsy and have poor balance for several hours. °· To have poor judgment for several hours. °· To vomit if you eat too soon. ° °Follow these instructions at home: °For at least 24 hours after the procedure: ° °· Do not: °? Participate in activities where you could fall or become injured. °? Drive. °? Use heavy machinery. °? Drink alcohol. °? Take sleeping pills or medicines that cause drowsiness. °? Make important decisions or sign legal documents. °? Take care of children on your own. °· Rest. °Eating and drinking °· Follow the diet recommended by your health care provider. °· If you vomit: °? Drink water, juice, or soup when you can drink without vomiting. °? Make sure you have little or no nausea before eating solid foods. °General instructions °· Have a responsible adult stay with you until you are awake and alert. °· Take over-the-counter and prescription medicines only as told by your health care provider. °· If you smoke, do not smoke without supervision. °· Keep all follow-up visits as told by your health care provider. This is important. °Contact a health care provider if: °· You keep feeling nauseous or you keep vomiting. °· You feel light-headed. °· You develop a rash. °· You have a fever. °Get help right away if: °· You have trouble breathing. °This information is not intended to replace advice given to you by your health care provider. Make sure you discuss any questions you have with your health care provider. °Document Released: 06/28/2013 Document Revised: 02/10/2016 Document Reviewed: 12/28/2015 °Elsevier Interactive Patient Education © 2018 Elsevier Inc. ° °

## 2017-05-25 ENCOUNTER — Ambulatory Visit
Admission: RE | Admit: 2017-05-25 | Discharge: 2017-05-25 | Disposition: A | Discharge status: Home / Self Care | Payer: Medicare Other | Source: Ambulatory Visit | Attending: Radiation Oncology | Admitting: Radiation Oncology

## 2017-05-25 DIAGNOSIS — Z87891 Personal history of nicotine dependence: Secondary | ICD-10-CM | POA: Diagnosis not present

## 2017-05-25 DIAGNOSIS — C7989 Secondary malignant neoplasm of other specified sites: Secondary | ICD-10-CM | POA: Diagnosis not present

## 2017-05-25 DIAGNOSIS — C342 Malignant neoplasm of middle lobe, bronchus or lung: Secondary | ICD-10-CM | POA: Diagnosis not present

## 2017-05-25 DIAGNOSIS — E876 Hypokalemia: Secondary | ICD-10-CM | POA: Diagnosis not present

## 2017-05-25 DIAGNOSIS — C3491 Malignant neoplasm of unspecified part of right bronchus or lung: Secondary | ICD-10-CM | POA: Diagnosis not present

## 2017-05-25 DIAGNOSIS — Z510 Encounter for antineoplastic radiation therapy: Secondary | ICD-10-CM | POA: Diagnosis not present

## 2017-05-25 DIAGNOSIS — I4892 Unspecified atrial flutter: Secondary | ICD-10-CM | POA: Diagnosis not present

## 2017-05-26 ENCOUNTER — Ambulatory Visit: Payer: Medicare Other

## 2017-05-26 ENCOUNTER — Ambulatory Visit
Admission: RE | Admit: 2017-05-26 | Discharge: 2017-05-26 | Disposition: A | Discharge status: Home / Self Care | Payer: Medicare Other | Source: Ambulatory Visit | Attending: Radiation Oncology | Admitting: Radiation Oncology

## 2017-05-26 DIAGNOSIS — C7989 Secondary malignant neoplasm of other specified sites: Secondary | ICD-10-CM | POA: Diagnosis not present

## 2017-05-26 DIAGNOSIS — E876 Hypokalemia: Secondary | ICD-10-CM | POA: Diagnosis not present

## 2017-05-26 DIAGNOSIS — Z87891 Personal history of nicotine dependence: Secondary | ICD-10-CM | POA: Diagnosis not present

## 2017-05-26 DIAGNOSIS — C3491 Malignant neoplasm of unspecified part of right bronchus or lung: Secondary | ICD-10-CM | POA: Diagnosis not present

## 2017-05-26 DIAGNOSIS — I4892 Unspecified atrial flutter: Secondary | ICD-10-CM | POA: Diagnosis not present

## 2017-05-26 DIAGNOSIS — C342 Malignant neoplasm of middle lobe, bronchus or lung: Secondary | ICD-10-CM | POA: Diagnosis not present

## 2017-05-26 DIAGNOSIS — Z510 Encounter for antineoplastic radiation therapy: Secondary | ICD-10-CM | POA: Diagnosis not present

## 2017-05-27 ENCOUNTER — Ambulatory Visit (HOSPITAL_BASED_OUTPATIENT_CLINIC_OR_DEPARTMENT_OTHER): Payer: Medicare Other | Admitting: Oncology

## 2017-05-27 ENCOUNTER — Other Ambulatory Visit (HOSPITAL_BASED_OUTPATIENT_CLINIC_OR_DEPARTMENT_OTHER): Payer: Medicare Other

## 2017-05-27 ENCOUNTER — Ambulatory Visit
Admission: RE | Admit: 2017-05-27 | Discharge: 2017-05-27 | Disposition: A | Discharge status: Home / Self Care | Payer: Medicare Other | Source: Ambulatory Visit | Attending: Radiation Oncology | Admitting: Radiation Oncology

## 2017-05-27 ENCOUNTER — Ambulatory Visit: Payer: Medicare Other

## 2017-05-27 ENCOUNTER — Encounter: Payer: Self-pay | Admitting: Radiation Oncology

## 2017-05-27 ENCOUNTER — Ambulatory Visit (HOSPITAL_BASED_OUTPATIENT_CLINIC_OR_DEPARTMENT_OTHER): Payer: Medicare Other

## 2017-05-27 VITALS — BP 121/86 | HR 110 | Temp 98.4°F | Resp 19 | Ht 68.0 in | Wt 216.6 lb

## 2017-05-27 VITALS — HR 103

## 2017-05-27 DIAGNOSIS — Z23 Encounter for immunization: Secondary | ICD-10-CM

## 2017-05-27 DIAGNOSIS — C3491 Malignant neoplasm of unspecified part of right bronchus or lung: Secondary | ICD-10-CM

## 2017-05-27 DIAGNOSIS — C3411 Malignant neoplasm of upper lobe, right bronchus or lung: Secondary | ICD-10-CM

## 2017-05-27 DIAGNOSIS — Z79899 Other long term (current) drug therapy: Secondary | ICD-10-CM | POA: Diagnosis not present

## 2017-05-27 DIAGNOSIS — C342 Malignant neoplasm of middle lobe, bronchus or lung: Secondary | ICD-10-CM | POA: Diagnosis not present

## 2017-05-27 DIAGNOSIS — R5382 Chronic fatigue, unspecified: Secondary | ICD-10-CM

## 2017-05-27 DIAGNOSIS — M545 Low back pain: Secondary | ICD-10-CM

## 2017-05-27 DIAGNOSIS — C7989 Secondary malignant neoplasm of other specified sites: Secondary | ICD-10-CM

## 2017-05-27 DIAGNOSIS — Z5112 Encounter for antineoplastic immunotherapy: Secondary | ICD-10-CM

## 2017-05-27 DIAGNOSIS — I4892 Unspecified atrial flutter: Secondary | ICD-10-CM | POA: Diagnosis not present

## 2017-05-27 DIAGNOSIS — Z87891 Personal history of nicotine dependence: Secondary | ICD-10-CM | POA: Diagnosis not present

## 2017-05-27 DIAGNOSIS — Z510 Encounter for antineoplastic radiation therapy: Secondary | ICD-10-CM | POA: Diagnosis not present

## 2017-05-27 DIAGNOSIS — E876 Hypokalemia: Secondary | ICD-10-CM | POA: Diagnosis not present

## 2017-05-27 DIAGNOSIS — C3481 Malignant neoplasm of overlapping sites of right bronchus and lung: Secondary | ICD-10-CM

## 2017-05-27 LAB — COMPREHENSIVE METABOLIC PANEL
ALBUMIN: 2.8 g/dL — AB (ref 3.5–5.0)
ALK PHOS: 81 U/L (ref 40–150)
ALT: 6 U/L (ref 0–55)
AST: 20 U/L (ref 5–34)
Anion Gap: 11 mEq/L (ref 3–11)
BUN: 7.5 mg/dL (ref 7.0–26.0)
CHLORIDE: 101 meq/L (ref 98–109)
CO2: 27 meq/L (ref 22–29)
Calcium: 9.9 mg/dL (ref 8.4–10.4)
Creatinine: 0.7 mg/dL (ref 0.6–1.1)
GLUCOSE: 108 mg/dL (ref 70–140)
POTASSIUM: 3.4 meq/L — AB (ref 3.5–5.1)
SODIUM: 138 meq/L (ref 136–145)
Total Bilirubin: 0.56 mg/dL (ref 0.20–1.20)
Total Protein: 8 g/dL (ref 6.4–8.3)

## 2017-05-27 LAB — CBC WITH DIFFERENTIAL/PLATELET
BASO%: 0.2 % (ref 0.0–2.0)
BASOS ABS: 0 10*3/uL (ref 0.0–0.1)
EOS%: 1.3 % (ref 0.0–7.0)
Eosinophils Absolute: 0.1 10*3/uL (ref 0.0–0.5)
HCT: 32.2 % — ABNORMAL LOW (ref 34.8–46.6)
HEMOGLOBIN: 10.8 g/dL — AB (ref 11.6–15.9)
LYMPH#: 0.4 10*3/uL — AB (ref 0.9–3.3)
LYMPH%: 9.1 % — ABNORMAL LOW (ref 14.0–49.7)
MCH: 29.4 pg (ref 25.1–34.0)
MCHC: 33.5 g/dL (ref 31.5–36.0)
MCV: 87.7 fL (ref 79.5–101.0)
MONO#: 0.2 10*3/uL (ref 0.1–0.9)
MONO%: 4.7 % (ref 0.0–14.0)
NEUT#: 3.8 10*3/uL (ref 1.5–6.5)
NEUT%: 84.7 % — AB (ref 38.4–76.8)
Platelets: 335 10*3/uL (ref 145–400)
RBC: 3.67 10*6/uL — AB (ref 3.70–5.45)
RDW: 15.1 % — ABNORMAL HIGH (ref 11.2–14.5)
WBC: 4.5 10*3/uL (ref 3.9–10.3)
nRBC: 0 % (ref 0–0)

## 2017-05-27 LAB — TSH: TSH: 2.383 m(IU)/L (ref 0.308–3.960)

## 2017-05-27 MED ORDER — HEPARIN SOD (PORK) LOCK FLUSH 100 UNIT/ML IV SOLN
500.0000 [IU] | Freq: Once | INTRAVENOUS | Status: AC | PRN
  Administered 2017-05-27: 500 [IU]
  Filled 2017-05-27: qty 5

## 2017-05-27 MED ORDER — SODIUM CHLORIDE 0.9 % IV SOLN
Freq: Once | INTRAVENOUS | Status: AC
  Administered 2017-05-27: 15:00:00 via INTRAVENOUS

## 2017-05-27 MED ORDER — ONDANSETRON HCL 8 MG PO TABS
8.0000 mg | ORAL_TABLET | Freq: Once | ORAL | Status: AC
  Administered 2017-05-27: 8 mg via ORAL

## 2017-05-27 MED ORDER — ONDANSETRON HCL 8 MG PO TABS
ORAL_TABLET | ORAL | Status: AC
  Filled 2017-05-27: qty 1

## 2017-05-27 MED ORDER — SODIUM CHLORIDE 0.9% FLUSH
10.0000 mL | INTRAVENOUS | Status: DC | PRN
  Administered 2017-05-27: 10 mL
  Filled 2017-05-27: qty 10

## 2017-05-27 MED ORDER — INFLUENZA VAC SPLIT QUAD 0.5 ML IM SUSY
0.5000 mL | PREFILLED_SYRINGE | Freq: Once | INTRAMUSCULAR | Status: AC
  Administered 2017-05-27: 0.5 mL via INTRAMUSCULAR
  Filled 2017-05-27: qty 0.5

## 2017-05-27 MED ORDER — LIDOCAINE-PRILOCAINE 2.5-2.5 % EX CREA: 1.0000 "application " | TOPICAL_CREAM | CUTANEOUS | 2 refills | Status: DC | PRN

## 2017-05-27 MED ORDER — FENTANYL 50 MCG/HR TD PT72: 50.0000 ug | MEDICATED_PATCH | TRANSDERMAL | 0 refills | Status: DC

## 2017-05-27 MED ORDER — PROCHLORPERAZINE MALEATE 10 MG PO TABS: 10.0000 mg | ORAL_TABLET | Freq: Four times a day (QID) | ORAL | 0 refills | Status: DC | PRN

## 2017-05-27 MED ORDER — OXYCODONE HCL 5 MG PO TABS: 5.0000 mg | ORAL_TABLET | Freq: Four times a day (QID) | ORAL | 0 refills | Status: DC | PRN

## 2017-05-27 MED ORDER — SODIUM CHLORIDE 0.9 % IV SOLN
200.0000 mg | Freq: Once | INTRAVENOUS | Status: AC
  Administered 2017-05-27: 200 mg via INTRAVENOUS
  Filled 2017-05-27: qty 8

## 2017-05-27 NOTE — Progress Notes (Signed)
Pt tolerated infusion well. Discharge instructions printed and verbally reviewed, pt verbalizes understanding.

## 2017-05-27 NOTE — Patient Instructions (Addendum)
[facility] Discharge Instructions for Patients Receiving Chemotherapy  Today you received the following chemotherapy agents: Keytruda   To help prevent nausea and vomiting after your treatment, we encourage you to take your nausea medication as directed.    If you develop nausea and vomiting that is not controlled by your nausea medication, call the clinic.   BELOW ARE SYMPTOMS THAT SHOULD BE REPORTED IMMEDIATELY:  *FEVER GREATER THAN 100.5 F  *CHILLS WITH OR WITHOUT FEVER  NAUSEA AND VOMITING THAT IS NOT CONTROLLED WITH YOUR NAUSEA MEDICATION  *UNUSUAL SHORTNESS OF BREATH  *UNUSUAL BRUISING OR BLEEDING  TENDERNESS IN MOUTH AND THROAT WITH OR WITHOUT PRESENCE OF ULCERS  *URINARY PROBLEMS  *BOWEL PROBLEMS  UNUSUAL RASH Items with * indicate a potential emergency and should be followed up as soon as possible.  Feel free to call the clinic you have any questions or concerns. The clinic phone number is [phone].  Please show the [place] at check-in to the Emergency Department and triage nurse.     Pembrolizumab injection What is this medicine? PEMBROLIZUMAB (pem broe liz ue mab) is a monoclonal antibody. It is used to treat melanoma, head and neck cancer, Hodgkin lymphoma, non-small cell lung cancer, urothelial cancer, stomach cancer, and cancers that have a certain genetic condition. This medicine may be used for other purposes; ask your health care provider or pharmacist if you have questions. COMMON BRAND NAME(S): Keytruda What should I tell my health care provider before I take this medicine? They need to know if you have any of these conditions: -diabetes -immune system problems -inflammatory bowel disease -liver disease -lung or breathing disease -lupus -organ transplant -an unusual or allergic reaction to pembrolizumab, other medicines, foods, dyes, or preservatives -pregnant or trying to get pregnant -breast-feeding How should I  use this medicine? This medicine is for infusion into a vein. It is given by a health care professional in a hospital or clinic setting. A special MedGuide will be given to you before each treatment. Be sure to read this information carefully each time. Talk to your pediatrician regarding the use of this medicine in children. While this drug may be prescribed for selected conditions, precautions do apply. Overdosage: If you think you have taken too much of this medicine contact a poison control center or emergency room at once. NOTE: This medicine is only for you. Do not share this medicine with others. What if I miss a dose? It is important not to miss your dose. Call your doctor or health care professional if you are unable to keep an appointment. What may interact with this medicine? Interactions have not been studied. Give your health care provider a list of all the medicines, herbs, non-prescription drugs, or dietary supplements you use. Also tell them if you smoke, drink alcohol, or use illegal drugs. Some items may interact with your medicine. This list may not describe all possible interactions. Give your health care provider a list of all the medicines, herbs, non-prescription drugs, or dietary supplements you use. Also tell them if you smoke, drink alcohol, or use illegal drugs. Some items may interact with your medicine. What should I watch for while using this medicine? Your condition will be monitored carefully while you are receiving this medicine. You may need blood work done while you are taking this medicine. Do not become pregnant while taking this medicine or for 4 months after stopping it. Women should inform their doctor if they wish to become pregnant  or think they might be pregnant. There is a potential for serious side effects to an unborn child. Talk to your health care professional or pharmacist for more information. Do not breast-feed an infant while taking this medicine or  for 4 months after the last dose. What side effects may I notice from receiving this medicine? Side effects that you should report to your doctor or health care professional as soon as possible: -allergic reactions like skin rash, itching or hives, swelling of the face, lips, or tongue -bloody or black, tarry -breathing problems -changes in vision -chest pain -chills -constipation -cough -dizziness or feeling faint or lightheaded -fast or irregular heartbeat -fever -flushing -hair loss -low blood counts - this medicine may decrease the number of white blood cells, red blood cells and platelets. You may be at increased risk for infections and bleeding. -muscle pain -muscle weakness -persistent headache -signs and symptoms of high blood sugar such as dizziness; dry mouth; dry skin; fruity breath; nausea; stomach pain; increased hunger or thirst; increased urination -signs and symptoms of kidney injury like trouble passing urine or change in the amount of urine -signs and symptoms of liver injury like dark urine, light-colored stools, loss of appetite, nausea, right upper belly pain, yellowing of the eyes or skin -stomach pain -sweating -weight loss Side effects that usually do not require medical attention (report to your doctor or health care professional if they continue or are bothersome): -decreased appetite -diarrhea -tiredness This list may not describe all possible side effects. Call your doctor for medical advice about side effects. You may report side effects to FDA at 1-800-FDA-1088. Where should I keep my medicine? This drug is given in a hospital or clinic and will not be stored at home. NOTE: This sheet is a summary. It may not cover all possible information. If you have questions about this medicine, talk to your doctor, pharmacist, or health care provider.  2018 Elsevier/Gold Standard (2016-06-16 12:29:36)

## 2017-05-28 ENCOUNTER — Ambulatory Visit: Payer: Medicare Other

## 2017-05-28 ENCOUNTER — Encounter: Payer: Self-pay | Admitting: Oncology

## 2017-05-28 ENCOUNTER — Telehealth: Payer: Self-pay | Admitting: Internal Medicine

## 2017-05-28 NOTE — Assessment & Plan Note (Signed)
This is a very pleasant 56 year old African-American female with metastatic non-small cell lung cancer that was initially diagnosed as stage IIIa non-small cell lung cancer, squamous cell carcinoma presented with large right hilar mass and mediastinal lymphadenopathy diagnosed in November 2016. She status post concurrent chemoradiation followed by consolidation chemotherapy. The patient was found recently to have intramuscular metastases and the right posterior chest wall that was biopsy-proven to be squamous cell carcinoma with PDL 1 expression50%. Status post radiation to the posterior chest mass with significant improvement in her pain.  The patient has had a Port-A-Cath placed for infusion of her immunotherapy. Recommend that she proceed with cycle 1 of her treatment with immunotherapy with Keytruda (pembrolizumab) 200 mg IV every 3 weeks.  For pain management, she will continue the fentanyl patch and oxycodone as needed for pain. Refills were given to the patient for both these medications today.  I have refilled her Compazine and given her prescription for EMLA cream for her Port-A-Cath.  Follow-up visit will be in 3 weeks for valuation prior to cycle 2 of Keytruda.

## 2017-05-28 NOTE — Progress Notes (Signed)
[facility] Follow up:    [name], PA-C [place] [id number]   DIAGNOSIS: Cancer Staging Non-small cell carcinoma of lung, stage 3 ([name]) Staging form: Lung, AJCC 7th Edition - Clinical stage from 08/08/2015: Stage IIIA (T2b, N2, M0) - Signed by Curt Bears, MD on 08/08/2015  Metastatic non-small cell lung cancer initially diagnosed as Stage IIIA (T2b, N2, M0) non-small cell lung cancer, invasive poorly differentiated squamous cell carcinoma diagnosed in November 2016 and presented with large right hilar mass with collapse of the right middle lobe and extension into the right upper lobe and right lower lobe along the hilum with right paratracheal and subcarinal lymphadenopathy. She presented in July 2018 with metastatic intramuscular mass in the right posterior lateral chest wall, with biopsy confirmed squamous cell carcinoma. PDL1 Expression: 50%.  SUMMARY OF ONCOLOGIC HISTORY: Oncology History   Patient presented in 05/2015 with cold like symptoms to ED.  Work up showed right lung mass.  Non-small cell carcinoma of lung, stage 3 ([facility])   Staging form: Lung, AJCC 7th Edition     Clinical stage from 08/08/2015: Stage IIIA (T2b, N2, M0) - Signed by Curt Bears, MD on 08/08/2015       Non-small cell carcinoma of lung, stage 3 ([name])   07/06/2015 Imaging    CXR IMPRESSION: Right middle lobe consolidation and an element of volume loss. Underlying malignancy cannot be excluded      07/12/2015 Imaging    CTA IMPRESSION: No evidence of pulmonary embolism.  6.1 x 4.1 cm posterior right upper lobe mass extending to the right middle lobe and right hilar region. Associated postobstructive opacity in the right upper and middle lobes.       07/22/2015 Imaging    CT Neck IMPRESSION: 1. Slight prominence of the tonsils without an abscess or mass. 2. No adenopathy in the neck.      08/02/2015 Surgery    PROCEDURE:  Bronchoscopy with brushings  and biopsies Endobronchial ultrasound with mediastinal lymph node aspirations.       08/02/2015 Pathology Results    Bronchus, biopsy, right upper lobe - INVASIVE POORLY DIFFERENTIATED SQUAMOUS CELL CARCINOMA      08/07/2015 Imaging    PET IMPRESSION:  6 cm right hilar mass with collapse of the right middle lobe and probable extension of mass into the right upper lobe and right lower lobe along the hilum, maximum standard uptake value 22.9, compatible with malignancy.       08/07/2015 Imaging    MRI Brain IMPRESSION: Mild atrophy. Minor white matter disease, nonspecific.  No acute intracranial findings.      08/08/2015 Initial Diagnosis    Non-small cell carcinoma of lung, stage 3 ([name])      08/09/2015 -  Radiation Therapy    SIM      08/19/2015 -  Chemotherapy    1st chemo      09/16/2015 Imaging    CTA IMPRESSION: 1. Negative for acute pulmonary embolus, pneumonia or other acute cardiopulmonary process. 2. Interval response to therapy with significantly decreased size of medial right upper lobe/right perihilar pulmonary mass        PRIOR THERAPY:  1) Concurrent chemoradiation with weekly carboplatin for AUC of 2 and paclitaxel 45 MG/M2. First dose 08/19/2015. Status post 5 cycles. Last cycle was given 10/07/2015 with significant response. 2) Consolidation chemotherapy with carboplatin for AUC of 5 and paclitaxel 175 MG/M2 every 3 weeks with Neulasta support. First dose 11/18/2015. Status post 3  cycles. 3) palliative radiotherapy to the intramuscular mass in the right posterior lateral chest wall under the care of Dr. Tammi Klippel.  CURRENT THERAPY: Keytruda 200 mg IV every 3 weeks. First dose 05/27/2017.  INTERVAL HISTORY: [name] 56 y.o. female returns for routine follow-up with her daughter. The patient has now completed radiation to the mass on her back. She reports significant improvement in her pain.She remains on fentanyl patches as well as oxycodone as needed  for her pain. Uses oxycodone 1-2 tablets every 4 hours as needed. Requests a refill for both her fentanyl and oxycodone today. The patient was due to begin [place] [name] at her last visit, but IV access could not be obtained.She has now had a Port-A-Cath placed for chemotherapy administration. She denies fevers and chills. Denies chest pain, shortness of breath, cough, hemoptysis. Denies abdominal pain, nausea, vomiting, constipation, diarrhea. The patient is here for evaluation today prior to cycle 1 of Keytruda.   Patient Active Problem List   Diagnosis Date Noted  . Encounter for antineoplastic immunotherapy 05/12/2017  . Metastatic primary lung cancer, right ([name]) 05/03/2017  . Dehydration 10/11/2015  . Nausea with vomiting 10/11/2015  . Chemotherapy induced neutropenia ([name]) 09/30/2015  . Hypokalemia 09/30/2015  . 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 Antineoplastic chemotherapy induced pancytopenia ([name]) 09/18/2015  . Constipation 09/18/2015  . Intractable nausea and vomiting 09/18/2015  . Typical atrial flutter ([place])   . Malignant neoplasm of overlapping sites of right lung ([name])   . Malnutrition of moderate degree 09/17/2015  . Chest pain 09/16/2015  . Tachycardia 09/16/2015  . Pain in the chest   . Encounter for antineoplastic chemotherapy 08/27/2015  . Non-small cell carcinoma of lung, stage 3 ([place]) 08/08/2015  . Lung mass 07/16/2015  . Malignant neoplasm of cervix ([name]) 10/08/2011    has No Known Allergies.  MEDICAL HISTORY: Past Medical History:  Diagnosis Date  . Cancer ([name])    5 years ago - cervical cancer  . Encounter for antineoplastic chemotherapy 08/27/2015  . History of radiation therapy 08/19/2015 - 10/07/2015   Site/dose:   The patient's primary tumor and involved lymph nodes were treated to 66 Gy in 30 fractions.  . Hypokalemia 09/30/2015  . Non-small cell carcinoma of lung, stage 3 ([name]) 08/08/2015  . Paroxysmal atrial flutter (The[name]) 09/16/2015  . Pneumonia      SURGICAL HISTORY: Past Surgical History:  Procedure Laterality Date  . IR FLUORO GUIDE PORT INSERTION RIGHT  05/21/2017  . IR US GUIDE VASC ACCESS RIGHT  05/21/2017  . TUBAL LIGATION    . VIDEO BRONCHOSCOPY WITH ENDOBRONCHIAL ULTRASOUND N/A 08/02/2015   Procedure: VIDEO BRONCHOSCOPY WITH ENDOBRONCHIAL ULTRASOUND;  Surgeon: [name], MD;  Location: [name];  Service: Thoracic;  Laterality: N/A;  . WISDOM TOOTH EXTRACTION      SOCIAL HISTORY: Social History   Social History  . Marital status: Single    Spouse name: N/A  . Number of children: N/A  . Years of education: N/A   Occupational History  . Not on file.   Social History Main Topics  . Smoking status: Former Smoker    Packs/day: 0.30    Years: 5.00    Types: Cigarettes    Quit date: 07/31/1990  . Smokeless tobacco: Never Used  . Alcohol use No  . Drug use: No  . Sexual activity: Not Currently   Other Topics Concern  . Not on file   Social History Narrative  . No narrative on file  FAMILY HISTORY: Family History  Problem Relation Age of Onset  . COPD Mother   . Diabetes type II Mother   . Heart disease Mother   . High Cholesterol Mother   . Lung cancer Father     Review of Systems  Constitutional: Positive for fatigue. Negative for appetite change, chills and fever.  HENT:  Negative.   Eyes: Negative.   Respiratory: Negative.   Cardiovascular: Negative.   Gastrointestinal: Negative.   Genitourinary: Negative.    Musculoskeletal:       Pain to the mass on her back.Pain is controlled with fentanyl and oxycodone.  Skin: Negative.   Neurological: Negative.   Hematological: Negative.   Psychiatric/Behavioral: Negative.       PHYSICAL EXAMINATION  ECOG PERFORMANCE STATUS: 1 - Symptomatic but completely ambulatory  Vitals:   05/27/17 1438  BP: 121/86  Pulse: (!) 110  Resp: 19  Temp: 98.4 F (36.9 C)  SpO2: 100%    Physical Exam  Constitutional: She is oriented to person,  place, and time and well-developed, well-nourished, and in no distress. No distress.  HENT:  Head: Normocephalic.  Mouth/Throat: Oropharynx is clear and moist. No oropharyngeal exudate.  Eyes: Conjunctivae are normal. Right eye exhibits no discharge. Left eye exhibits no discharge. No scleral icterus.  Neck: Normal range of motion. Neck supple.  Cardiovascular: Normal rate, regular rhythm, normal heart sounds and intact distal pulses.   Pulmonary/Chest: Effort normal and breath sounds normal. No respiratory distress. She has no wheezes. She has no rales.  Abdominal: Soft. Bowel sounds are normal. She exhibits no distension and no mass. There is no tenderness.  Musculoskeletal: Normal range of motion. She exhibits no edema.  Lymphadenopathy:    She has no cervical adenopathy.  Neurological: She is alert and oriented to person, place, and time. She exhibits normal muscle tone. Gait normal.  Skin: Skin is warm and dry. No rash noted. She is not diaphoretic. No erythema. No pallor.  Psychiatric: Mood, memory, affect and judgment normal.  Vitals reviewed. examination of the back shows a palpable 14 cm mass above the right scapula.  LABORATORY DATA:  CBC    Component Value Date/Time   WBC 4.5 05/27/2017 1343   WBC 8.2 04/19/2017 1207   RBC 3.67 (L) 05/27/2017 1343   RBC 3.79 (L) 04/19/2017 1207   HGB 10.8 (L) 05/27/2017 1343   HCT 32.2 (L) 05/27/2017 1343   PLT 335 05/27/2017 1343   PLT 300 02/11/2017 1632   MCV 87.7 05/27/2017 1343   MCH 29.4 05/27/2017 1343   MCH 30.6 04/19/2017 1207   MCHC 33.5 05/27/2017 1343   MCHC 34.9 04/19/2017 1207   RDW 15.1 (H) 05/27/2017 1343   LYMPHSABS 0.4 (L) 05/27/2017 1343   MONOABS 0.2 05/27/2017 1343   EOSABS 0.1 05/27/2017 1343   BASOSABS 0.0 05/27/2017 1343    CMP     Component Value Date/Time   NA 138 05/27/2017 1343   K 3.4 (L) 05/27/2017 1343   CL 99 (L) 04/19/2017 1207   CO2 27 05/27/2017 1343   GLUCOSE 108 05/27/2017 1343   BUN  7.5 05/27/2017 1343   CREATININE 0.7 05/27/2017 1343   CALCIUM 9.9 05/27/2017 1343   PROT 8.0 05/27/2017 1343   ALBUMIN 2.8 (L) 05/27/2017 1343   AST 20 05/27/2017 1343   ALT 6 05/27/2017 1343   ALKPHOS 81 05/27/2017 1343   BILITOT 0.56 05/27/2017 1343   GFRNONAA >60 04/19/2017 1207   GFRAA >60 04/19/2017 1207  RADIOGRAPHIC STUDIES:  No results found.  ASSESSMENT and THERAPY PLAN:   Metastatic primary lung cancer, right [facility]) This is a very pleasant 56 year old African-American female with metastatic non-small cell lung cancer that was initially diagnosed as stage IIIa non-small cell lung cancer, squamous cell carcinoma presented with large right hilar mass and mediastinal lymphadenopathy diagnosed in November 2016. She status post concurrent chemoradiation followed by consolidation chemotherapy. The patient was found recently to have intramuscular metastases and the right posterior chest wall that was biopsy-proven to be squamous cell carcinoma with PDL 1 expression50%. Status post radiation to the posterior chest mass with significant improvement in her pain.  The patient has had a Port-A-Cath placed for infusion of her immunotherapy. Recommend that she proceed with cycle 1 of her treatment with immunotherapy with Keytruda (pembrolizumab) 200 mg IV every 3 weeks.  For pain management, she will continue the fentanyl patch and oxycodone as needed for pain. Refills were given to the patient for both these medications today.  I have refilled her Compazine and given her prescription for EMLA cream for her Port-A-Cath.  Follow-up visit will be in 3 weeks for valuation prior to cycle 2 of Keytruda.   No orders of the defined types were placed in this encounter.   All questions were answered. The patient knows to call the clinic with any problems, questions or concerns. We can certainly see the patient much sooner if necessary.  Mikey Bussing, NP 05/28/2017

## 2017-05-28 NOTE — Telephone Encounter (Signed)
lvm to inform pt of r/s 9/12 appts to 9/27 at 0930 per sch msg

## 2017-05-28 NOTE — Progress Notes (Signed)
°  Radiation Oncology         [phone] ________________________________  Name: Theresa [name] MRN: [id number]  Date: 05/27/2017  DOB: June 26, 1961  End of Treatment Note  Diagnosis:   56 yo woman with right upper back subcutaneous muscle metastasis from poorly differentiated squamous cell carcinoma of the right middle lobe of the lung    Indication for treatment:  Curative, Definitive SBRT       Radiation treatment dates:   05/12/2017 to 05/27/2017  Site/dose:   The target was treated to 30 Gy in 10 fractions of 3 Gy  Beams/energy:   The patient was treated using stereotactic body radiotherapy according to a 3D conformal radiotherapy plan.  Volumetric arc fields were employed to deliver 6 MV X-rays.  Image guidance was performed with per fraction cone beam CT prior to treatment under personal MD supervision.  Immobilization was achieved using BodyFix Pillow.  Narrative: The patient tolerated radiation treatment relatively well.  She denied cough, shortness of breath or difficulty swallowing. She experienced moderate fatigue and right upper back pain at tumor site 7 on a scale of 0-10 but reported that the pain improved.  There is still a palpable mass present on the right upper back with no major change in size, however, it is no longer tender on palpation, representing an improvement.   Plan: The patient has completed radiation treatment. The patient will return to radiation oncology clinic for routine followup in one month. I advised them to call or return sooner if they have any questions or concerns related to their recovery or treatment. ________________________________  Sheral Apley. Tammi Klippel, M.D.  This document serves as a record of services personally performed by Tyler Pita, MD. It was created on his behalf by Arlyce Harman, a trained medical scribe. The creation of this record is based on the scribe's personal observations and the provider's statements to them. This document  has been checked and approved by the attending provider.

## 2017-05-31 ENCOUNTER — Telehealth: Payer: Self-pay | Admitting: Internal Medicine

## 2017-05-31 ENCOUNTER — Ambulatory Visit: Admission: RE | Admit: 2017-05-31 | Payer: Medicare Other | Source: Ambulatory Visit

## 2017-05-31 ENCOUNTER — Other Ambulatory Visit: Payer: Self-pay | Admitting: Medical Oncology

## 2017-05-31 ENCOUNTER — Ambulatory Visit (HOSPITAL_BASED_OUTPATIENT_CLINIC_OR_DEPARTMENT_OTHER): Payer: Medicare Other | Admitting: Medical

## 2017-05-31 ENCOUNTER — Telehealth: Payer: Self-pay | Admitting: Medical Oncology

## 2017-05-31 DIAGNOSIS — R110 Nausea: Secondary | ICD-10-CM

## 2017-05-31 DIAGNOSIS — C3411 Malignant neoplasm of upper lobe, right bronchus or lung: Secondary | ICD-10-CM | POA: Diagnosis not present

## 2017-05-31 DIAGNOSIS — C3491 Malignant neoplasm of unspecified part of right bronchus or lung: Secondary | ICD-10-CM

## 2017-05-31 DIAGNOSIS — K5909 Other constipation: Secondary | ICD-10-CM | POA: Diagnosis not present

## 2017-05-31 DIAGNOSIS — G893 Neoplasm related pain (acute) (chronic): Secondary | ICD-10-CM

## 2017-05-31 DIAGNOSIS — R112 Nausea with vomiting, unspecified: Secondary | ICD-10-CM

## 2017-05-31 MED ORDER — ONDANSETRON HCL 4 MG/2ML IJ SOLN
INTRAMUSCULAR | Status: AC
  Filled 2017-05-31: qty 4

## 2017-05-31 MED ORDER — ONDANSETRON 4 MG PO TBDP: 4.0000 mg | ORAL_TABLET | Freq: Three times a day (TID) | ORAL | 5 refills | Status: DC | PRN

## 2017-05-31 MED ORDER — ONDANSETRON HCL 4 MG/2ML IJ SOLN
8.0000 mg | Freq: Once | INTRAMUSCULAR | Status: AC
  Administered 2017-05-31: 8 mg via INTRAVENOUS

## 2017-05-31 MED ORDER — HEPARIN SOD (PORK) LOCK FLUSH 100 UNIT/ML IV SOLN
500.0000 [IU] | Freq: Once | INTRAVENOUS | Status: AC
  Administered 2017-05-31: 500 [IU] via INTRAVENOUS
  Filled 2017-05-31: qty 5

## 2017-05-31 MED ORDER — SODIUM CHLORIDE 0.9 % IV SOLN
INTRAVENOUS | Status: DC
  Administered 2017-05-31: 16:00:00 via INTRAVENOUS

## 2017-05-31 MED ORDER — ONDANSETRON HCL 40 MG/20ML IJ SOLN: Freq: Once | INTRAMUSCULAR | Status: DC | PRN

## 2017-05-31 MED ORDER — LACTULOSE 10 GM/15ML PO SOLN: 10.0000 g | Freq: Three times a day (TID) | ORAL | 3 refills | Status: DC | PRN

## 2017-05-31 MED ORDER — ONDANSETRON HCL 8 MG PO TABS: 8.0000 mg | ORAL_TABLET | Freq: Three times a day (TID) | ORAL | 0 refills | Status: DC | PRN

## 2017-05-31 MED ORDER — SODIUM CHLORIDE 0.9% FLUSH
10.0000 mL | Freq: Once | INTRAVENOUS | Status: AC
  Administered 2017-05-31: 10 mL via INTRAVENOUS
  Filled 2017-05-31: qty 10

## 2017-05-31 NOTE — Patient Instructions (Signed)
Dehydration, Adult Dehydration is a condition in which there is not enough fluid or water in the body. This happens when you lose more fluids than you take in. Important organs, such as the kidneys, brain, and heart, cannot function without a proper amount of fluids. Any loss of fluids from the body can lead to dehydration. Dehydration can range from mild to severe. This condition should be treated right away to prevent it from becoming severe. What are the causes? This condition may be caused by:  Vomiting.  Diarrhea.  Excessive sweating, such as from heat exposure or exercise.  Not drinking enough fluid, especially: ? When ill. ? While doing activity that requires a lot of energy.  Excessive urination.  Fever.  Infection.  Certain medicines, such as medicines that cause the body to lose excess fluid (diuretics).  Inability to access safe drinking water.  Reduced physical ability to get adequate water and food.  What increases the risk? This condition is more likely to develop in people:  Who have a poorly controlled long-term (chronic) illness, such as diabetes, heart disease, or kidney disease.  Who are age 65 or older.  Who are disabled.  Who live in a place with high altitude.  Who play endurance sports.  What are the signs or symptoms? Symptoms of mild dehydration may include:  Thirst.  Dry lips.  Slightly dry mouth.  Dry, warm skin.  Dizziness. Symptoms of moderate dehydration may include:  Very dry mouth.  Muscle cramps.  Dark urine. Urine may be the color of tea.  Decreased urine production.  Decreased tear production.  Heartbeat that is irregular or faster than normal (palpitations).  Headache.  Light-headedness, especially when you stand up from a sitting position.  Fainting (syncope). Symptoms of severe dehydration may include:  Changes in skin, such as: ? Cold and clammy skin. ? Blotchy (mottled) or pale skin. ? Skin that does  not quickly return to normal after being lightly pinched and released (poor skin turgor).  Changes in body fluids, such as: ? Extreme thirst. ? No tear production. ? Inability to sweat when body temperature is high, such as in hot weather. ? Very little urine production.  Changes in vital signs, such as: ? Weak pulse. ? Pulse that is more than 100 beats a minute when sitting still. ? Rapid breathing. ? Low blood pressure.  Other changes, such as: ? Sunken eyes. ? Cold hands and feet. ? Confusion. ? Lack of energy (lethargy). ? Difficulty waking up from sleep. ? Short-term weight loss. ? Unconsciousness. How is this diagnosed? This condition is diagnosed based on your symptoms and a physical exam. Blood and urine tests may be done to help confirm the diagnosis. How is this treated? Treatment for this condition depends on the severity. Mild or moderate dehydration can often be treated at home. Treatment should be started right away. Do not wait until dehydration becomes severe. Severe dehydration is an emergency and it needs to be treated in a hospital. Treatment for mild dehydration may include:  Drinking more fluids.  Replacing salts and minerals in your blood (electrolytes) that you may have lost. Treatment for moderate dehydration may include:  Drinking an oral rehydration solution (ORS). This is a drink that helps you replace fluids and electrolytes (rehydrate). It can be found at pharmacies and retail stores. Treatment for severe dehydration may include:  Receiving fluids through an IV tube.  Receiving an electrolyte solution through a feeding tube that is passed through your nose   and into your stomach (nasogastric tube, or NG tube).  Correcting any abnormalities in electrolytes.  Treating the underlying cause of dehydration. Follow these instructions at home:  If directed by your health care provider, drink an ORS: ? Make an ORS by following instructions on the  package. ? Start by drinking small amounts, about  cup (120 mL) every 5-10 minutes. ? Slowly increase how much you drink until you have taken the amount recommended by your health care provider.  Drink enough clear fluid to keep your urine clear or pale yellow. If you were told to drink an ORS, finish the ORS first, then start slowly drinking other clear fluids. Drink fluids such as: ? Water. Do not drink only water. Doing that can lead to having too little salt (sodium) in the body (hyponatremia). ? Ice chips. ? Fruit juice that you have added water to (diluted fruit juice). ? Low-calorie sports drinks.  Avoid: ? Alcohol. ? Drinks that contain a lot of sugar. These include high-calorie sports drinks, fruit juice that is not diluted, and soda. ? Caffeine. ? Foods that are greasy or contain a lot of fat or sugar.  Take over-the-counter and prescription medicines only as told by your health care provider.  Do not take sodium tablets. This can lead to having too much sodium in the body (hypernatremia).  Eat foods that contain a healthy balance of electrolytes, such as bananas, oranges, potatoes, tomatoes, and spinach.  Keep all follow-up visits as told by your health care provider. This is important. Contact a health care provider if:  You have abdominal pain that: ? Gets worse. ? Stays in one area (localizes).  You have a rash.  You have a stiff neck.  You are more irritable than usual.  You are sleepier or more difficult to wake up than usual.  You feel weak or dizzy.  You feel very thirsty.  You have urinated only a small amount of very dark urine over 6-8 hours. Get help right away if:  You have symptoms of severe dehydration.  You cannot drink fluids without vomiting.  Your symptoms get worse with treatment.  You have a fever.  You have a severe headache.  You have vomiting or diarrhea that: ? Gets worse. ? Does not go away.  You have blood or green matter  (bile) in your vomit.  You have blood in your stool. This may cause stool to look black and tarry.  You have not urinated in 6-8 hours.  You faint.  Your heart rate while sitting still is over 100 beats a minute.  You have trouble breathing. This information is not intended to replace advice given to you by your health care provider. Make sure you discuss any questions you have with your health care provider. Document Released: 09/07/2005 Document Revised: 04/03/2016 Document Reviewed: 11/01/2015 Elsevier Interactive Patient Education  2018 [employer].    Constipation, Adult Constipation is when a person has fewer bowel movements in a week than normal, has difficulty having a bowel movement, or has stools that are dry, hard, or larger than normal. Constipation may be caused by an underlying condition. It may become worse with age if a person takes certain medicines and does not take in enough fluids. Follow these instructions at home: Eating and drinking   Eat foods that have a lot of fiber, such as fresh fruits and vegetables, whole grains, and beans.  Limit foods that are high in fat, low in fiber, or overly processed,  such as french fries, hamburgers, cookies, candies, and soda.  Drink enough fluid to keep your urine clear or pale yellow. General instructions  Exercise regularly or as told by your health care provider.  Go to the restroom when you have the urge to go. Do not hold it in.  Take over-the-counter and prescription medicines only as told by your health care provider. These include any fiber supplements.  Practice pelvic floor retraining exercises, such as deep breathing while relaxing the lower abdomen and pelvic floor relaxation during bowel movements.  Watch your condition for any changes.  Keep all follow-up visits as told by your health care provider. This is important. Contact a health care provider if:  You have pain that gets worse.  You have a  fever.  You do not have a bowel movement after 4 days.  You vomit.  You are not hungry.  You lose weight.  You are bleeding from the anus.  You have thin, pencil-like stools. Get help right away if:  You have a fever and your symptoms suddenly get worse.  You leak stool or have blood in your stool.  Your abdomen is bloated.  You have severe pain in your abdomen.  You feel dizzy or you faint. This information is not intended to replace advice given to you by your health care provider. Make sure you discuss any questions you have with your health care provider. Document Released: 06/05/2004 Document Revised: 03/27/2016 Document Reviewed: 02/26/2016 Elsevier Interactive Patient Education  2017 [employer].

## 2017-05-31 NOTE — Telephone Encounter (Signed)
Spoke with patient regarding her appts that were cancelled in October. She wanted to speak with Erasmo Downer - so I sent ehr an Inbasket to call her.

## 2017-05-31 NOTE — Telephone Encounter (Signed)
Pt reports she is having dry heaves. Has not eaten or drank "In 5 days. Compazine is not working. LBM was this am one small hard stool. She took Mag citrate last night and vomited it back up. Per Mohamed ivf today , zofran and 1/2 bottle of mag citrate and miralax daily. Pt notified to expect a call today for IVFappt. I told her to bring her meds today.

## 2017-05-31 NOTE — Telephone Encounter (Signed)
sw pt to confirm ivf today at 3 pm per sch msg

## 2017-05-31 NOTE — Progress Notes (Unsigned)
Ok to infuse IV fluids @ 732mls/hour per Sandi Mealy, PA

## 2017-06-01 ENCOUNTER — Ambulatory Visit (HOSPITAL_BASED_OUTPATIENT_CLINIC_OR_DEPARTMENT_OTHER): Payer: Medicare Other | Admitting: Medical

## 2017-06-01 ENCOUNTER — Ambulatory Visit: Payer: Medicare Other

## 2017-06-01 VITALS — BP 127/84 | HR 97 | Temp 99.3°F | Resp 18 | Wt 212.5 lb

## 2017-06-01 DIAGNOSIS — C3411 Malignant neoplasm of upper lobe, right bronchus or lung: Secondary | ICD-10-CM | POA: Diagnosis not present

## 2017-06-01 DIAGNOSIS — G893 Neoplasm related pain (acute) (chronic): Secondary | ICD-10-CM

## 2017-06-01 DIAGNOSIS — R112 Nausea with vomiting, unspecified: Secondary | ICD-10-CM

## 2017-06-01 MED ORDER — SODIUM CHLORIDE 0.9 % IV SOLN
Freq: Once | INTRAVENOUS | Status: AC
  Administered 2017-06-01: 12:00:00 via INTRAVENOUS

## 2017-06-01 MED ORDER — ONDANSETRON HCL 4 MG/2ML IJ SOLN
INTRAMUSCULAR | Status: AC
  Filled 2017-06-01: qty 4

## 2017-06-01 MED ORDER — HEPARIN SOD (PORK) LOCK FLUSH 100 UNIT/ML IV SOLN
500.0000 [IU] | Freq: Once | INTRAVENOUS | Status: AC
  Administered 2017-06-01: 500 [IU] via INTRAVENOUS
  Filled 2017-06-01: qty 5

## 2017-06-01 MED ORDER — ONDANSETRON HCL 4 MG/2ML IJ SOLN
8.0000 mg | Freq: Once | INTRAMUSCULAR | Status: AC
  Administered 2017-06-01: 8 mg via INTRAVENOUS

## 2017-06-01 MED ORDER — MORPHINE SULFATE (PF) 4 MG/ML IV SOLN
INTRAVENOUS | Status: AC
  Filled 2017-06-01: qty 1

## 2017-06-01 MED ORDER — SODIUM CHLORIDE 0.9 % IV SOLN: Freq: Once | INTRAVENOUS | Status: DC

## 2017-06-01 MED ORDER — MORPHINE SULFATE 4 MG/ML IJ SOLN
2.0000 mg | Freq: Once | INTRAMUSCULAR | Status: AC
  Administered 2017-06-01: 2 mg via INTRAVENOUS
  Filled 2017-06-01: qty 1

## 2017-06-01 MED ORDER — ONDANSETRON 4 MG PO TBDP: 4.0000 mg | ORAL_TABLET | Freq: Three times a day (TID) | ORAL | 5 refills | Status: DC | PRN

## 2017-06-01 MED ORDER — SODIUM CHLORIDE 0.9% FLUSH
10.0000 mL | Freq: Once | INTRAVENOUS | Status: AC
  Administered 2017-06-01: 10 mL via INTRAVENOUS
  Filled 2017-06-01: qty 10

## 2017-06-01 NOTE — Patient Instructions (Signed)
Dehydration, Adult Dehydration is a condition in which there is not enough fluid or water in the body. This happens when you lose more fluids than you take in. Important organs, such as the kidneys, brain, and heart, cannot function without a proper amount of fluids. Any loss of fluids from the body can lead to dehydration. Dehydration can range from mild to severe. This condition should be treated right away to prevent it from becoming severe. What are the causes? This condition may be caused by:  Vomiting.  Diarrhea.  Excessive sweating, such as from heat exposure or exercise.  Not drinking enough fluid, especially: ? When ill. ? While doing activity that requires a lot of energy.  Excessive urination.  Fever.  Infection.  Certain medicines, such as medicines that cause the body to lose excess fluid (diuretics).  Inability to access safe drinking water.  Reduced physical ability to get adequate water and food.  What increases the risk? This condition is more likely to develop in people:  Who have a poorly controlled long-term (chronic) illness, such as diabetes, heart disease, or kidney disease.  Who are age 65 or older.  Who are disabled.  Who live in a place with high altitude.  Who play endurance sports.  What are the signs or symptoms? Symptoms of mild dehydration may include:  Thirst.  Dry lips.  Slightly dry mouth.  Dry, warm skin.  Dizziness. Symptoms of moderate dehydration may include:  Very dry mouth.  Muscle cramps.  Dark urine. Urine may be the color of tea.  Decreased urine production.  Decreased tear production.  Heartbeat that is irregular or faster than normal (palpitations).  Headache.  Light-headedness, especially when you stand up from a sitting position.  Fainting (syncope). Symptoms of severe dehydration may include:  Changes in skin, such as: ? Cold and clammy skin. ? Blotchy (mottled) or pale skin. ? Skin that does  not quickly return to normal after being lightly pinched and released (poor skin turgor).  Changes in body fluids, such as: ? Extreme thirst. ? No tear production. ? Inability to sweat when body temperature is high, such as in hot weather. ? Very little urine production.  Changes in vital signs, such as: ? Weak pulse. ? Pulse that is more than 100 beats a minute when sitting still. ? Rapid breathing. ? Low blood pressure.  Other changes, such as: ? Sunken eyes. ? Cold hands and feet. ? Confusion. ? Lack of energy (lethargy). ? Difficulty waking up from sleep. ? Short-term weight loss. ? Unconsciousness. How is this diagnosed? This condition is diagnosed based on your symptoms and a physical exam. Blood and urine tests may be done to help confirm the diagnosis. How is this treated? Treatment for this condition depends on the severity. Mild or moderate dehydration can often be treated at home. Treatment should be started right away. Do not wait until dehydration becomes severe. Severe dehydration is an emergency and it needs to be treated in a hospital. Treatment for mild dehydration may include:  Drinking more fluids.  Replacing salts and minerals in your blood (electrolytes) that you may have lost. Treatment for moderate dehydration may include:  Drinking an oral rehydration solution (ORS). This is a drink that helps you replace fluids and electrolytes (rehydrate). It can be found at pharmacies and retail stores. Treatment for severe dehydration may include:  Receiving fluids through an IV tube.  Receiving an electrolyte solution through a feeding tube that is passed through your nose   and into your stomach (nasogastric tube, or NG tube).  Correcting any abnormalities in electrolytes.  Treating the underlying cause of dehydration. Follow these instructions at home:  If directed by your health care provider, drink an ORS: ? Make an ORS by following instructions on the  package. ? Start by drinking small amounts, about  cup (120 mL) every 5-10 minutes. ? Slowly increase how much you drink until you have taken the amount recommended by your health care provider.  Drink enough clear fluid to keep your urine clear or pale yellow. If you were told to drink an ORS, finish the ORS first, then start slowly drinking other clear fluids. Drink fluids such as: ? Water. Do not drink only water. Doing that can lead to having too little salt (sodium) in the body (hyponatremia). ? Ice chips. ? Fruit juice that you have added water to (diluted fruit juice). ? Low-calorie sports drinks.  Avoid: ? Alcohol. ? Drinks that contain a lot of sugar. These include high-calorie sports drinks, fruit juice that is not diluted, and soda. ? Caffeine. ? Foods that are greasy or contain a lot of fat or sugar.  Take over-the-counter and prescription medicines only as told by your health care provider.  Do not take sodium tablets. This can lead to having too much sodium in the body (hypernatremia).  Eat foods that contain a healthy balance of electrolytes, such as bananas, oranges, potatoes, tomatoes, and spinach.  Keep all follow-up visits as told by your health care provider. This is important. Contact a health care provider if:  You have abdominal pain that: ? Gets worse. ? Stays in one area (localizes).  You have a rash.  You have a stiff neck.  You are more irritable than usual.  You are sleepier or more difficult to wake up than usual.  You feel weak or dizzy.  You feel very thirsty.  You have urinated only a small amount of very dark urine over 6-8 hours. Get help right away if:  You have symptoms of severe dehydration.  You cannot drink fluids without vomiting.  Your symptoms get worse with treatment.  You have a fever.  You have a severe headache.  You have vomiting or diarrhea that: ? Gets worse. ? Does not go away.  You have blood or green matter  (bile) in your vomit.  You have blood in your stool. This may cause stool to look black and tarry.  You have not urinated in 6-8 hours.  You faint.  Your heart rate while sitting still is over 100 beats a minute.  You have trouble breathing. This information is not intended to replace advice given to you by your health care provider. Make sure you discuss any questions you have with your health care provider. Document Released: 09/07/2005 Document Revised: 04/03/2016 Document Reviewed: 11/01/2015 Elsevier Interactive Patient Education  2018 [employer]  

## 2017-06-01 NOTE — Progress Notes (Signed)
Reviewed medication for relieving constipation with patient. She now has lactulose and needs to take 15 mls every 3 hours until her bowels move. Pt voiced understanding.  Pt to call Financial assistance tomorrow to talk about help with her medications.  Pt has card with Shauna's # on it.  RN visit only for IV Fluids

## 2017-06-02 ENCOUNTER — Telehealth: Payer: Self-pay | Admitting: *Deleted

## 2017-06-02 ENCOUNTER — Encounter: Payer: Self-pay | Admitting: Medical

## 2017-06-02 ENCOUNTER — Ambulatory Visit: Payer: Medicare Other | Admitting: Oncology

## 2017-06-02 ENCOUNTER — Ambulatory Visit: Payer: Medicare Other

## 2017-06-02 ENCOUNTER — Other Ambulatory Visit: Payer: Medicare Other

## 2017-06-02 NOTE — Telephone Encounter (Signed)
TC to patient this morning to check on her status.  Pt had been to [facility] x 2 days for IV fluids for dehydration, nausea and constipation. Spoke with patient. She states she is feeling better. She has been taking Lactulose 15 mls every 3 hours while awake for constipation. She states she had a small BM this morning. Instructed pt to continue Lactulose every 3 hours until she has had very good results and then she can either stop or back off. Pt has long history of constipation and likely needs to continue at least daily.  Denies fever, nausea. Eating small amounts of food regularly and drinking fluids w/o problems. Pt knows to call back if need arises.

## 2017-06-02 NOTE — Progress Notes (Signed)
[facility]pt[facility] Management [facility] Progress Note   Theresa [name] [id number] Sep 22, 1960 56 y.o.  Theresa [name] is managed by Dr.  Bretta Bang  Actively treated with chemotherapy: yes  Current Therapy: Keytruda  Last Treated: 09/ 06 / 2018  Assessment/Plan:   Metastatic primary lung cancer, right ([place])  Non-intractable vomiting with nausea, unspecified vomiting type  Other constipation  Neoplasm related pain   Metastatic primary lung cancer: the patient was last treated with Keytruda on 05/27/2017.  Non-intractable vomiting with nausea, unspecified nausea type: The patient was given Zofran 8 mg IV today along with IV fluids. She was also given a prescription for Zofran ODT 4 mg tablets every 8 hours when necessary for nausea. She will return on 06/01/2017 for additional IV fluids and antiemetics.  Constipation:  The patient was told that it was important to use senna with a stool softener. She was also given a prescription for lactulose 10 ml by mouth 3 times a day when necessary for constipation. She was previously treated with Linzess by GI for her chronic constipation.  She had been given samples by gastroenterology. She cannot afford this medication.  Neoplasm-related pain: The patient has been taking oxycodone IR 5 mg for her back and abdominal pain. She has a prescription for fentanyl patches 50 g. Her pharmacy has been out of these. She has not used these for 5 days.  Please see After Visit Summary for patient specific instructions.  Future Appointments Date Time Provider [place]  06/17/2017 9:30 AM [facility] LAB 4 [facility] None  06/17/2017 10:00 AM Mikey Bussing R, NP [facility] None  06/17/2017 11:00 AM [facility] H30 [facility] None  07/08/2017 11:30 AM [facility] LAB 3 [facility] None  07/08/2017 12:00 PM Curt Bears, MD [facility] None  07/08/2017 1:00 PM [facility] I26 DNS [facility] None  07/29/2017 10:30 AM [facility] LAB 2 [facility]  None  07/29/2017 11:15 AM Curt Bears, MD [facility] None  07/29/2017 12:15 PM [facility] C10 [facility] None    No orders of the defined types were placed in this encounter.      Subjective:   Patient ID:  Theresa [name] is a 56 y.o. (DOB 06-08-1961) female.  Chief Complaint: No chief complaint on file.   HPI Theresa [name] is a 56 year old female with a diagnosis of a metastatic primary lung cancer with a metastatic mass in her right superior lateral chest wall. She completed 10 fractions of radiation last week and was dosed with Keytruda on 05/27/2017.  She received a flu shot at that time. She has a history of chronic constipation and had previously been seen by gastroenterology and was treated with Linzess. She had been given free samples. She cannot afford to pay for this medication. She has used a Fleet's enema, mag citrate, and milk of magnesia and has only been able to achieve one small hard stool. Otherwise she has had no bowel movements for 4 days. She has had anorexia since her last chemotherapy treatment. She continues to drink Boost but vomits after drinking a can. She has a prescription for Compazine and Zofran at home but reports that she is splitting up her medications within 10 minutes taking them. She however reports that she has not seen an intact pill when she "spits up". She has increased oral secretions and reports that she is spitting all the time. The patient continues to have pain in her right upper back and some diffuse pain in her abdomen. She has a prescription for fentanyl patches 50 g  but states that her pharmacy does not have them. They have been out of this medication for 5 days. She has been using oxycodone as needed for her pain. She is not requesting any pain medications today.   Constipation:  Theresa [name] reports constipation.   Stool pattern has been 1 small hard stool after taking a fleets enema, mag citrate, and milk of magnesia stool(s)  after 4 days.  Onset: He has a long-standing history of constipation and has been seen by GI and previously treated with Linzess. Defecation has been difficult. Co-Morbid conditions:Long-standing history of constipation likely compounded by her use of opioids.  Symptoms have been waxing and waning but worse overall.  Current Health Habits: Anorexia after her last chemotherapy Exercise?no Water intake? Decreased by mouth intake Current OTC/RX therapy has been stimulant Fleets enema, magnesium citrate, and milk of magnesia which has been ineffective.     Medications: I have reviewed the patient's current medications.  Allergies: No Known Allergies  Past Medical History:  Diagnosis Date  . Cancer ([place])    5 years ago - cervical cancer  . Encounter for antineoplastic chemotherapy 08/27/2015  . History of radiation therapy 08/19/2015 - 10/07/2015   Site/dose:   The patient's primary tumor and involved lymph nodes were treated to 66 Gy in 30 fractions.  . Hypokalemia 09/30/2015  . Non-small cell carcinoma of lung, stage 3 ([place]) 08/08/2015  . Paroxysmal atrial flutter ([place]) 09/16/2015  . Pneumonia     Past Surgical History:  Procedure Laterality Date  . IR FLUORO GUIDE PORT INSERTION RIGHT  05/21/2017  . IR US GUIDE VASC ACCESS RIGHT  05/21/2017  . TUBAL LIGATION    . VIDEO BRONCHOSCOPY WITH ENDOBRONCHIAL ULTRASOUND N/A 08/02/2015   Procedure: VIDEO BRONCHOSCOPY WITH ENDOBRONCHIAL ULTRASOUND;  Surgeon: [name], MD;  Location: [facility];  Service: Thoracic;  Laterality: N/A;  . WISDOM TOOTH EXTRACTION      @FAMHISTORY @  Social History   Social History  . Marital status: Single    Spouse name: N/A  . Number of children: N/A  . Years of education: N/A   Occupational History  . Not on file.   Social History Main Topics  . Smoking status: Former Smoker    Packs/day: 0.30    Years: 5.00    Types: Cigarettes    Quit date: 07/31/1990  . Smokeless tobacco: Never Used  .  Alcohol use No  . Drug use: No  . Sexual activity: Not Currently   Other Topics Concern  . Not on file   Social History Narrative  . No narrative on file    Past Medical History, Surgical history, Social history, and Family history were reviewed and updated as appropriate.   Please see review of systems for further details on the patient's review from today.   Review of Systems:  Review of Systems  Constitutional: Positive for activity change, appetite change and fatigue. Negative for chills, diaphoresis and fever.  HENT: Negative for trouble swallowing.        Increased oral secretions  Respiratory: Negative for cough.   Cardiovascular: Negative for chest pain and leg swelling.  Gastrointestinal: Positive for abdominal pain, constipation, nausea and vomiting. Negative for abdominal distention and blood in stool.  Genitourinary: Negative for difficulty urinating.  Musculoskeletal: Positive for myalgias.    Objective:   Physical Exam:  There were no vitals taken for this visit. ECOG: 2  Physical Exam  Constitutional: No distress.  HENT:  Head: Normocephalic and  atraumatic.  Cardiovascular: S1 normal, S2 normal and normal heart sounds.  Tachycardia present.   Pulmonary/Chest: Effort normal and breath sounds normal. No respiratory distress. She has no wheezes. She has no rales.  Abdominal: Soft. She exhibits no distension. Bowel sounds are decreased. There is no tenderness. There is no rebound and no guarding.  Musculoskeletal: She exhibits no edema.  Neurological: She is alert.  Skin: Skin is warm and dry. No rash noted. She is not diaphoretic. No erythema.    Lab Review:     Component Value Date/Time   NA 138 05/27/2017 1343   K 3.4 (L) 05/27/2017 1343   CL 99 (L) 04/19/2017 1207   CO2 27 05/27/2017 1343   GLUCOSE 108 05/27/2017 1343   BUN 7.5 05/27/2017 1343   CREATININE 0.7 05/27/2017 1343   CALCIUM 9.9 05/27/2017 1343   PROT 8.0 05/27/2017 1343   ALBUMIN 2.8  (L) 05/27/2017 1343   AST 20 05/27/2017 1343   ALT 6 05/27/2017 1343   ALKPHOS 81 05/27/2017 1343   BILITOT 0.56 05/27/2017 1343   GFRNONAA >60 04/19/2017 1207   GFRAA >60 04/19/2017 1207       Component Value Date/Time   WBC 4.5 05/27/2017 1343   WBC 8.2 04/19/2017 1207   RBC 3.67 (L) 05/27/2017 1343   RBC 3.79 (L) 04/19/2017 1207   HGB 10.8 (L) 05/27/2017 1343   HCT 32.2 (L) 05/27/2017 1343   PLT 335 05/27/2017 1343   PLT 300 02/11/2017 1632   MCV 87.7 05/27/2017 1343   MCH 29.4 05/27/2017 1343   MCH 30.6 04/19/2017 1207   MCHC 33.5 05/27/2017 1343   MCHC 34.9 04/19/2017 1207   RDW 15.1 (H) 05/27/2017 1343   LYMPHSABS 0.4 (L) 05/27/2017 1343   MONOABS 0.2 05/27/2017 1343   EOSABS 0.1 05/27/2017 1343   BASOSABS 0.0 05/27/2017 1343   -------------------------------  Imaging from last 24 hours (if applicable):  Radiology interpretation: Mr Brain Wo Contrast  Result Date: 05/04/2017 CLINICAL DATA:  Non-small-cell lung cancer.  Follow-up. EXAM: MRI HEAD WITHOUT CONTRAST TECHNIQUE: Multiplanar, multiecho pulse sequences of the brain and surrounding structures were obtained without intravenous contrast. COMPARISON:  08/07/2015 FINDINGS: The patient could not tolerate complete scanning because of pain. No contrast was administered. Brain: Has a image to, the brain does not show any abnormality. No sign of atrophy, old or acute small or large vessel infarction, mass lesion, hemorrhage, hydrocephalus or extra-axial collection. No pituitary mass. Vascular: Major vessels at the base of the brain show flow. Skull and upper cervical spine: No osseous lesions seen. Sinuses/Orbits: Clear/normal Other: None IMPRESSION: Examination terminated because of patient pain. No contrast was administered. No abnormality is demonstrated. No evidence of ischemic disease or metastatic disease. Electronically Signed   By: Nelson Chimes M.D.   On: 05/04/2017 08:41   Nm Pet Image Restag (ps) Skull Base To  Thigh  Result Date: 05/05/2017 CLINICAL DATA:  Subsequent treatment strategy for right middle lobe lung cancer. EXAM: NUCLEAR MEDICINE PET SKULL BASE TO THIGH TECHNIQUE: 11 point for mCi F-18 FDG was injected intravenously. Full-ring PET imaging was performed from the skull base to thigh after the radiotracer. CT data was obtained and used for attenuation correction and anatomic localization. FASTING BLOOD GLUCOSE:  Value: 114 mg/dl COMPARISON:  Multiple exams, including 08/07/2015 and prior chest CT of 04/19/2017 FINDINGS: NECK Accentuated posterior nasopharyngeal activity is likely physiologic and is increased from prior, maximum SUV 7.4. Bilateral accentuated pharyngeal tonsillar activity, maximum SUV 12.6 on the  right (formerly 11.1) and 10.1 on the left (formerly 9.4). Physiologic glottic activity noted. CHEST Hypermetabolic mass along the right posterior chest wall deep to the latissimus dorsi muscle and extending into the right pleural effusion and pleural space, total size of the hypermetabolic involvement 10.9 by 7.2 cm, maximum SUV 18.9. This hypermetabolic activity extends through the adjacent intercostal spaces and is new compared to prior. Hypermetabolic foci along the right upper pleura. An indistinct right hilar lymph node is significantly smaller than previous but has a maximum standard uptake value of 9 point 2, the conglomerate right hilar mass previously had a maximum SUV of 22.9. Right perihilar consolidation probably from radiation therapy. Prior subcarinal hypermetabolic adenopathy has resolved. Prior extensive right paratracheal adenopathy has resolved. Lower thoracic para-aortic adenopathy near the level of the hiatus observed with individual lymph nodes measuring up to 0.8 cm in short axis and with maximum SUV 11.9, new compared to the prior exam. ABDOMEN/PELVIS No abnormal hypermetabolic activity within the liver, pancreas, adrenal glands, or spleen. No hypermetabolic lymph nodes in the  abdomen or pelvis. Small hypodense lesions in the dome of the liver are stable and not hypermetabolic. SKELETON No focal hypermetabolic activity to suggest skeletal metastasis. IMPRESSION: 1. Although the primary tumor and original thoracic adenopathy are improved, there is unfortunately new pleural metastatic disease on the right with multiple foci including a large tumor along the pleura of the right costophrenic angle which extends out through multiple intercostal spaces into the right chest wall, creating a large conglomerate tumor deep to the right latissimus dorsi muscle, compatible with extensive chest wall invasion. There is a residual hypermetabolic right hilar lymph node along with some new hypermetabolic adenopathy adjacent to the distal esophagus and distal thoracic aorta at the hiatus. 2. Bilateral but relatively symmetric palatine tonsillar activity, probably physiologic. Electronically Signed   By: Wacey Zieger Clines M.D.   On: 05/05/2017 10:46   Ir US Guide Vasc Access Right  Result Date: 05/21/2017 CLINICAL DATA:  Metastatic lung carcinoma and need for porta cath for chemotherapy and IV access needs. EXAM: IMPLANTED PORT A CATH PLACEMENT WITH ULTRASOUND AND FLUOROSCOPIC GUIDANCE ANESTHESIA/SEDATION: 4.0 mg IV Versed; 100 mcg IV Fentanyl Total Moderate Sedation Time:  46 minutes The patient's level of consciousness and physiologic status were continuously monitored during the procedure by Radiology nursing. Additional Medications: 2 g IV Ancef. As antibiotic prophylaxis, Ancef was ordered pre-procedure and administered intravenously within one hour of incision. FLUOROSCOPY TIME:  3 minutes and 30 seconds.  42.2 mGy. PROCEDURE: The procedure, risks, benefits, and alternatives were explained to the patient. Questions regarding the procedure were encouraged and answered. The patient understands and consents to the procedure. A time-out was performed prior to initiating the procedure. Ultrasound was  utilized to confirm patency of the right internal jugular vein. The right neck and chest were prepped with chlorhexidine in a sterile fashion, and a sterile drape was applied covering the operative field. Maximum barrier sterile technique with sterile gowns and gloves were used for the procedure. Local anesthesia was provided with 1% lidocaine. After creating a small venotomy incision, a 21 gauge needle was advanced into the right internal jugular vein under direct, real-time ultrasound guidance. Ultrasound image documentation was performed. After securing guidewire access, an 8 Fr dilator was placed. A J-wire was kinked to measure appropriate catheter length. A subcutaneous port pocket was then created along the upper chest wall utilizing sharp and blunt dissection. Portable cautery was utilized. The pocket was irrigated with sterile saline.  A single lumen power injectable port was chosen for placement. The 8 Fr catheter was tunneled from the port pocket site to the venotomy incision. The port was placed in the pocket. External catheter was trimmed to appropriate length based on guidewire measurement. At the venotomy, an 8 Fr peel-away sheath was placed over a guidewire. The catheter was then placed through the sheath and the sheath removed. Final catheter positioning was confirmed and documented with a fluoroscopic spot image. The port was accessed with a needle and aspirated and flushed with heparinized saline. The access needle was removed. The venotomy and port pocket incisions were closed with subcutaneous 3-0 Monocryl and subcuticular 4-0 Vicryl. Dermabond was applied to both incisions. COMPLICATIONS: COMPLICATIONS None FINDINGS: After catheter placement, the tip lies at the cavo-atrial junction. The catheter aspirates normally and is ready for immediate use. IMPRESSION: Placement of single lumen port a cath via right internal jugular vein. The catheter tip lies at the cavo-atrial junction. A power injectable  port a cath was placed and is ready for immediate use. Electronically Signed   By: Aletta Edouard M.D.   On: 05/21/2017 17:40   Ir Fluoro Guide Port Insertion Right  Result Date: 05/21/2017 CLINICAL DATA:  Metastatic lung carcinoma and need for porta cath for chemotherapy and IV access needs. EXAM: IMPLANTED PORT A CATH PLACEMENT WITH ULTRASOUND AND FLUOROSCOPIC GUIDANCE ANESTHESIA/SEDATION: 4.0 mg IV Versed; 100 mcg IV Fentanyl Total Moderate Sedation Time:  46 minutes The patient's level of consciousness and physiologic status were continuously monitored during the procedure by Radiology nursing. Additional Medications: 2 g IV Ancef. As antibiotic prophylaxis, Ancef was ordered pre-procedure and administered intravenously within one hour of incision. FLUOROSCOPY TIME:  3 minutes and 30 seconds.  42.2 mGy. PROCEDURE: The procedure, risks, benefits, and alternatives were explained to the patient. Questions regarding the procedure were encouraged and answered. The patient understands and consents to the procedure. A time-out was performed prior to initiating the procedure. Ultrasound was utilized to confirm patency of the right internal jugular vein. The right neck and chest were prepped with chlorhexidine in a sterile fashion, and a sterile drape was applied covering the operative field. Maximum barrier sterile technique with sterile gowns and gloves were used for the procedure. Local anesthesia was provided with 1% lidocaine. After creating a small venotomy incision, a 21 gauge needle was advanced into the right internal jugular vein under direct, real-time ultrasound guidance. Ultrasound image documentation was performed. After securing guidewire access, an 8 Fr dilator was placed. A J-wire was kinked to measure appropriate catheter length. A subcutaneous port pocket was then created along the upper chest wall utilizing sharp and blunt dissection. Portable cautery was utilized. The pocket was irrigated with  sterile saline. A single lumen power injectable port was chosen for placement. The 8 Fr catheter was tunneled from the port pocket site to the venotomy incision. The port was placed in the pocket. External catheter was trimmed to appropriate length based on guidewire measurement. At the venotomy, an 8 Fr peel-away sheath was placed over a guidewire. The catheter was then placed through the sheath and the sheath removed. Final catheter positioning was confirmed and documented with a fluoroscopic spot image. The port was accessed with a needle and aspirated and flushed with heparinized saline. The access needle was removed. The venotomy and port pocket incisions were closed with subcutaneous 3-0 Monocryl and subcuticular 4-0 Vicryl. Dermabond was applied to both incisions. COMPLICATIONS: COMPLICATIONS None FINDINGS: After catheter placement, the tip lies  at the cavo-atrial junction. The catheter aspirates normally and is ready for immediate use. IMPRESSION: Placement of single lumen port a cath via right internal jugular vein. The catheter tip lies at the cavo-atrial junction. A power injectable port a cath was placed and is ready for immediate use. Electronically Signed   By: Aletta Edouard M.D.   On: 05/21/2017 17:40        This patient was seen with Dr. Julien Nordmann with my treatment plan reviewed with him. He expressed agreement with my medical management of this patient.  ADDENDUM: Hematology/Oncology Attending: I had a face to face encounter with the patient. I recommended her care plan. This is a very pleasant 56 years old African-American female with metastatic non-small cell lung cancer, squamous cell carcinoma with PDL 1 expression of 50%. She status post palliative radiotherapy to the intramuscular mass in the posterior lateral chest wall. She is currently on treatment with immunotherapy with Ketruda (pembrolizumab) status post 1 cycle. The patient has been complaining of increasing fatigue and weakness  as well as persistent nausea and vomiting, by mouth intake and dehydration. She also has constipation for several days. We will arrange for the patient to receive IV hydration with normal saline today. She will also receive IV Zofran today. We will change her oral Zofran to ODT We will consider the patient for few more days of IV hydration in the clinic until improvement of her condition. I also recommended for the patient to continue her MiraLAX as well as magnesium citrate as needed. She will come back for follow-up visit as previously scheduled before starting cycle #2 of her treatment with immunotherapy. The patient was advised to call immediately if she has any concerning symptoms in the interval.  Disclaimer: This note was dictated with voice recognition software. Similar sounding words can inadvertently be transcribed and may be missed upon review. Eilleen Kempf, MD 06/02/17

## 2017-06-16 ENCOUNTER — Other Ambulatory Visit: Payer: Self-pay | Admitting: Oncology

## 2017-06-17 ENCOUNTER — Ambulatory Visit (HOSPITAL_BASED_OUTPATIENT_CLINIC_OR_DEPARTMENT_OTHER): Payer: Medicare Other

## 2017-06-17 ENCOUNTER — Ambulatory Visit: Payer: Medicare Other | Admitting: Nutrition

## 2017-06-17 ENCOUNTER — Other Ambulatory Visit (HOSPITAL_BASED_OUTPATIENT_CLINIC_OR_DEPARTMENT_OTHER): Payer: Medicare Other

## 2017-06-17 ENCOUNTER — Ambulatory Visit (HOSPITAL_BASED_OUTPATIENT_CLINIC_OR_DEPARTMENT_OTHER): Payer: Medicare Other | Admitting: Oncology

## 2017-06-17 VITALS — BP 105/69 | HR 124 | Temp 98.1°F | Resp 20 | Ht 68.0 in | Wt 209.9 lb

## 2017-06-17 DIAGNOSIS — C3491 Malignant neoplasm of unspecified part of right bronchus or lung: Secondary | ICD-10-CM

## 2017-06-17 DIAGNOSIS — Z5112 Encounter for antineoplastic immunotherapy: Secondary | ICD-10-CM

## 2017-06-17 DIAGNOSIS — R5382 Chronic fatigue, unspecified: Secondary | ICD-10-CM

## 2017-06-17 DIAGNOSIS — C3411 Malignant neoplasm of upper lobe, right bronchus or lung: Secondary | ICD-10-CM

## 2017-06-17 DIAGNOSIS — E860 Dehydration: Secondary | ICD-10-CM

## 2017-06-17 DIAGNOSIS — C3481 Malignant neoplasm of overlapping sites of right bronchus and lung: Secondary | ICD-10-CM

## 2017-06-17 LAB — CBC WITH DIFFERENTIAL/PLATELET
BASO%: 0.6 % (ref 0.0–2.0)
BASOS ABS: 0 10*3/uL (ref 0.0–0.1)
EOS ABS: 0 10*3/uL (ref 0.0–0.5)
EOS%: 0.7 % (ref 0.0–7.0)
HCT: 33.4 % — ABNORMAL LOW (ref 34.8–46.6)
HGB: 11 g/dL — ABNORMAL LOW (ref 11.6–15.9)
LYMPH%: 15.8 % (ref 14.0–49.7)
MCH: 29.5 pg (ref 25.1–34.0)
MCHC: 32.9 g/dL (ref 31.5–36.0)
MCV: 89.7 fL (ref 79.5–101.0)
MONO#: 0.6 10*3/uL (ref 0.1–0.9)
MONO%: 14.2 % — AB (ref 0.0–14.0)
NEUT#: 3 10*3/uL (ref 1.5–6.5)
NEUT%: 68.7 % (ref 38.4–76.8)
Platelets: 304 10*3/uL (ref 145–400)
RBC: 3.72 10*6/uL (ref 3.70–5.45)
RDW: 17.6 % — ABNORMAL HIGH (ref 11.2–14.5)
WBC: 4.4 10*3/uL (ref 3.9–10.3)
lymph#: 0.7 10*3/uL — ABNORMAL LOW (ref 0.9–3.3)

## 2017-06-17 LAB — COMPREHENSIVE METABOLIC PANEL
ALK PHOS: 74 U/L (ref 40–150)
ALT: 13 U/L (ref 0–55)
AST: 23 U/L (ref 5–34)
Albumin: 3.1 g/dL — ABNORMAL LOW (ref 3.5–5.0)
Anion Gap: 9 mEq/L (ref 3–11)
BILIRUBIN TOTAL: 0.63 mg/dL (ref 0.20–1.20)
BUN: 7.4 mg/dL (ref 7.0–26.0)
CO2: 26 meq/L (ref 22–29)
Calcium: 10 mg/dL (ref 8.4–10.4)
Chloride: 103 mEq/L (ref 98–109)
Creatinine: 0.8 mg/dL (ref 0.6–1.1)
GLUCOSE: 112 mg/dL (ref 70–140)
POTASSIUM: 3.8 meq/L (ref 3.5–5.1)
SODIUM: 138 meq/L (ref 136–145)
Total Protein: 8.2 g/dL (ref 6.4–8.3)

## 2017-06-17 LAB — TSH: TSH: 3.111 m(IU)/L (ref 0.308–3.960)

## 2017-06-17 MED ORDER — SODIUM CHLORIDE 0.9% FLUSH
10.0000 mL | INTRAVENOUS | Status: DC | PRN
  Filled 2017-06-17: qty 10

## 2017-06-17 MED ORDER — SODIUM CHLORIDE 0.9 % IV SOLN
Freq: Once | INTRAVENOUS | Status: AC
  Administered 2017-06-17: 11:00:00 via INTRAVENOUS

## 2017-06-17 MED ORDER — SODIUM CHLORIDE 0.9 % IV SOLN
200.0000 mg | Freq: Once | INTRAVENOUS | Status: AC
  Administered 2017-06-17: 200 mg via INTRAVENOUS
  Filled 2017-06-17: qty 8

## 2017-06-17 MED ORDER — HEPARIN SOD (PORK) LOCK FLUSH 100 UNIT/ML IV SOLN
500.0000 [IU] | Freq: Once | INTRAVENOUS | Status: AC | PRN
  Administered 2017-06-17: 500 [IU]
  Filled 2017-06-17: qty 5

## 2017-06-17 MED ORDER — HEPARIN SOD (PORK) LOCK FLUSH 100 UNIT/ML IV SOLN
500.0000 [IU] | Freq: Once | INTRAVENOUS | Status: DC | PRN
  Filled 2017-06-17: qty 5

## 2017-06-17 MED ORDER — SODIUM CHLORIDE 0.9% FLUSH
10.0000 mL | INTRAVENOUS | Status: DC | PRN
  Administered 2017-06-17: 10 mL
  Filled 2017-06-17: qty 10

## 2017-06-17 MED ORDER — OXYCODONE HCL 5 MG PO TABS: 5.0000 mg | ORAL_TABLET | Freq: Four times a day (QID) | ORAL | 0 refills | Status: DC | PRN

## 2017-06-17 NOTE — Assessment & Plan Note (Signed)
This is a very pleasant 56 year old African-American female with metastatic non-small cell lung cancer that was initially diagnosed as stage IIIa non-small cell lung cancer, squamous cell carcinoma presented with large right hilar mass and mediastinal lymphadenopathy diagnosed in November 2016. She status post concurrent chemoradiation followed by consolidation chemotherapy. The patient was found recently to have intramuscular metastases and the right posterior chest wall that was biopsy-proven to be squamous cell carcinoma with PDL 1 expression50%. Status post radiation to the posterior chest mass with significant improvement in her pain.  Recommend that she proceed with cycle 2 of hertreatment with immunotherapy with Keytruda (pembrolizumab) 200 mg IV every 3 weeks.  For pain management, she will continue oxycodone as needed for pain. Refill of oxycodone given today.  Follow-up visit will be in 3 weeks for valuation prior to cycle 3 of Keytruda.

## 2017-06-17 NOTE — Progress Notes (Signed)
Nutrition follow-up completed with patient receiving Keytruda for non-small cell lung cancer. Weight documented as 209.9 pounds on September 27 which has been trending down. Noted patient had recent nausea, dehydration and constipation. Patient was drinking boost but had some vomiting after consumption.  Nutrition diagnosis: Food and nutrition related knowledge deficit progressing.  Intervention: Patient educated to continue small frequent meals and snacks with adequate calories and protein. Reviewed importance of resuming supplements as tolerated. Questions were answered.  Teach back method used.  Monitoring, evaluation, goals: Patient will tolerate adequate calories and protein to minimize further weight loss.  Next visit: Thursday, October 18, during infusion.  **Disclaimer: This note was dictated with voice recognition software. Similar sounding words can inadvertently be transcribed and this note may contain transcription errors which may not have been corrected upon publication of note.**

## 2017-06-17 NOTE — Patient Instructions (Signed)
[facility] Discharge Instructions for Patients Receiving Chemotherapy  Today you received the following chemotherapy agents: Keytruda   To help prevent nausea and vomiting after your treatment, we encourage you to take your nausea medication as directed    If you develop nausea and vomiting that is not controlled by your nausea medication, call the clinic.   BELOW ARE SYMPTOMS THAT SHOULD BE REPORTED IMMEDIATELY:  *FEVER GREATER THAN 100.5 F  *CHILLS WITH OR WITHOUT FEVER  NAUSEA AND VOMITING THAT IS NOT CONTROLLED WITH YOUR NAUSEA MEDICATION  *UNUSUAL SHORTNESS OF BREATH  *UNUSUAL BRUISING OR BLEEDING  TENDERNESS IN MOUTH AND THROAT WITH OR WITHOUT PRESENCE OF ULCERS  *URINARY PROBLEMS  *BOWEL PROBLEMS  UNUSUAL RASH Items with * indicate a potential emergency and should be followed up as soon as possible.  Feel free to call the clinic you have any questions or concerns. The clinic phone number is [phone].  Please show the CHEMO ALERT CARD at check-in to the Emergency Department and triage nurse.   

## 2017-06-17 NOTE — Progress Notes (Signed)
[facility] Follow up:    Theresa [name], Theresa [name] [place] [address]   DIAGNOSIS: Metastatic non-small cell lung cancer initially diagnosed as Stage IIIA (T2b, N2, M0) non-small cell lung cancer, invasive poorly differentiated squamous cell carcinoma diagnosed in November 2016 and presented with large right hilar mass with collapse of the right middle lobe and extension into the right upper lobe and right lower lobe along the hilum with right paratracheal and subcarinal lymphadenopathy. She presented in July 2018 with metastatic intramuscular mass in the right posterior lateral chest wall, with biopsy confirmed squamous cell carcinoma. PDL1 Expression: 50%.  SUMMARY OF ONCOLOGIC HISTORY: Oncology History   Patient presented in 05/2015 with cold like symptoms to ED.  Work up showed right lung mass.  Non-small cell carcinoma of lung, stage 3 (HCC)   Staging form: Lung, AJCC 7th Edition     Clinical stage from 08/08/2015: Stage IIIA (T2b, N2, M0) - Signed by Curt Bears, MD on 08/08/2015       Non-small cell carcinoma of lung, stage 3 ([place])   07/06/2015 Imaging    CXR IMPRESSION: Right middle lobe consolidation and an element of volume loss. Underlying malignancy cannot be excluded      07/12/2015 Imaging    CTA IMPRESSION: No evidence of pulmonary embolism.  6.1 x 4.1 cm posterior right upper lobe mass extending to the right middle lobe and right hilar region. Associated postobstructive opacity in the right upper and middle lobes.       07/22/2015 Imaging    CT Neck IMPRESSION: 1. Slight prominence of the tonsils without an abscess or mass. 2. No adenopathy in the neck.      08/02/2015 Surgery    PROCEDURE:  Bronchoscopy with brushings and biopsies Endobronchial ultrasound with mediastinal lymph node aspirations.       08/02/2015 Pathology Results    Bronchus, biopsy, right upper lobe - INVASIVE POORLY DIFFERENTIATED SQUAMOUS CELL CARCINOMA       08/07/2015 Imaging    PET IMPRESSION:  6 cm right hilar mass with collapse of the right middle lobe and probable extension of mass into the right upper lobe and right lower lobe along the hilum, maximum standard uptake value 22.9, compatible with malignancy.       08/07/2015 Imaging    MRI Brain IMPRESSION: Mild atrophy. Minor white matter disease, nonspecific.  No acute intracranial findings.      08/08/2015 Initial Diagnosis    Non-small cell carcinoma of lung, stage 3 ([place])      08/09/2015 -  Radiation Therapy    SIM      08/19/2015 -  Chemotherapy    1st chemo      09/16/2015 Imaging    CTA IMPRESSION: 1. Negative for acute pulmonary embolus, pneumonia or other acute cardiopulmonary process. 2. Interval response to therapy with significantly decreased size of medial right upper lobe/right perihilar pulmonary mass       PRIOR THERAPY:  1) Concurrent chemoradiation with weekly carboplatin for AUC of 2 and paclitaxel 45 MG/M2. First dose 08/19/2015. Status post 5 cycles. Last cycle was given 10/07/2015 with significant response. 2) Consolidation chemotherapy with carboplatin for AUC of 5 and paclitaxel 175 MG/M2 every 3 weeks with Neulasta support. First dose 11/18/2015. Status post 3 cycles. 3) palliative radiotherapy to the intramuscular mass in the right posterior lateral chest wall under the care of Dr. Tammi Klippel.  CURRENT THERAPY: Keytruda 200 mg IV every 3 weeks. First dose 05/27/2017. Status post  1 cycle.  INTERVAL HISTORY: Theresa [name] 56 y.o. female returns for routine follow-up with a family member. She is doing well with no specific complaints. Eating better, but she has lost more weight. She reports significant improvement in her pain. No longer using fentanyl patches. Uses oxycodone as needed for her pain. Requests a refill of oxycodone today. Tolerated first cycle of Keytruda well.  She denies fevers and chills. Denies chest pain, shortness of breath, cough,  hemoptysis. Denies abdominal pain, nausea, vomiting, constipation, diarrhea. The patient is here for evaluation today prior to cycle #2 of Keytruda.   Patient Active Problem List   Diagnosis Date Noted  . Encounter for antineoplastic immunotherapy 05/12/2017  . Metastatic primary lung cancer, right ([place]) 05/03/2017  . Dehydration 10/11/2015  . Nausea with vomiting 10/11/2015  . Chemotherapy induced neutropenia ([place]) 09/30/2015  . Hypokalemia 09/30/2015  . 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 Antineoplastic chemotherapy induced pancytopenia ([place]) 09/18/2015  . Constipation 09/18/2015  . Intractable nausea and vomiting 09/18/2015  . Typical atrial flutter ([place])   . Malignant neoplasm of overlapping sites of right lung ([place])   . Malnutrition of moderate degree 09/17/2015  . Chest pain 09/16/2015  . Tachycardia 09/16/2015  . Pain in the chest   . Encounter for antineoplastic chemotherapy 08/27/2015  . Non-small cell carcinoma of lung, stage 3 (Theresa [name]) 08/08/2015  . Lung mass 07/16/2015  . Malignant neoplasm of cervix ([place]) 10/08/2011    has No Known Allergies.  MEDICAL HISTORY: Past Medical History:  Diagnosis Date  . Cancer ([place])    5 years ago - cervical cancer  . Encounter for antineoplastic chemotherapy 08/27/2015  . History of radiation therapy 08/19/2015 - 10/07/2015   Site/dose:   The patient's primary tumor and involved lymph nodes were treated to 66 Gy in 30 fractions.  . Hypokalemia 09/30/2015  . Non-small cell carcinoma of lung, stage 3 ([place]) 08/08/2015  . Paroxysmal atrial flutter ([place]) 09/16/2015  . Pneumonia     SURGICAL HISTORY: Past Surgical History:  Procedure Laterality Date  . IR FLUORO GUIDE PORT INSERTION RIGHT  05/21/2017  . IR US GUIDE VASC ACCESS RIGHT  05/21/2017  . TUBAL LIGATION    . VIDEO BRONCHOSCOPY WITH ENDOBRONCHIAL ULTRASOUND N/A 08/02/2015   Procedure: VIDEO BRONCHOSCOPY WITH ENDOBRONCHIAL ULTRASOUND;  Surgeon: [name], MD;   Location: [facility];  Service: Thoracic;  Laterality: N/A;  . WISDOM TOOTH EXTRACTION      SOCIAL HISTORY: Social History   Social History  . Marital status: Single    Spouse name: N/A  . Number of children: N/A  . Years of education: N/A   Occupational History  . Not on file.   Social History Main Topics  . Smoking status: Former Smoker    Packs/day: 0.30    Years: 5.00    Types: Cigarettes    Quit date: 07/31/1990  . Smokeless tobacco: Never Used  . Alcohol use No  . Drug use: No  . Sexual activity: Not Currently   Other Topics Concern  . Not on file   Social History Narrative  . No narrative on file    FAMILY HISTORY: Family History  Problem Relation Age of Onset  . COPD Mother   . Diabetes type II Mother   . Heart disease Mother   . High Cholesterol Mother   . Lung cancer Father     Review of Systems  Constitutional: Negative.   HENT:  Negative.   Eyes: Negative.  Respiratory: Negative.   Cardiovascular: Negative.   Gastrointestinal: Negative.   Genitourinary: Negative.    Musculoskeletal:       Pain to right back mass which is improving.  Skin: Negative.   Neurological: Negative.   Hematological: Negative.   Psychiatric/Behavioral: Negative.      PHYSICAL EXAMINATION  ECOG PERFORMANCE STATUS: 1 - Symptomatic but completely ambulatory  Vitals:   06/17/17 1018  BP: 105/69  Pulse: (!) 124  Resp: 20  Temp: 98.1 F (36.7 C)  SpO2: 99%    Physical Exam  Constitutional: She is oriented to person, place, and time and well-developed, well-nourished, and in no distress. No distress.  HENT:  Head: Normocephalic.  Mouth/Throat: Oropharynx is clear and moist. No oropharyngeal exudate.  Eyes: Conjunctivae are normal. Right eye exhibits no discharge. Left eye exhibits no discharge. No scleral icterus.  Neck: Normal range of motion. Neck supple.  Cardiovascular: Normal rate, regular rhythm, normal heart sounds and intact distal pulses.    Pulmonary/Chest: Effort normal and breath sounds normal. No respiratory distress. She has no wheezes.  Abdominal: Soft. Bowel sounds are normal. She exhibits no distension and no mass. There is no tenderness.  Musculoskeletal: Normal range of motion. She exhibits no edema.  Lymphadenopathy:    She has no cervical adenopathy.  Neurological: She is alert and oriented to person, place, and time. She exhibits normal muscle tone. Coordination normal.  Skin: Skin is warm and dry. No rash noted. She is not diaphoretic. No erythema. No pallor.  Psychiatric: Mood, memory, affect and judgment normal.  Vitals reviewed. Examination of the back showed a palpable 10 cm mass above the scapula.   LABORATORY DATA:  CBC    Component Value Date/Time   WBC 4.4 06/17/2017 0959   WBC 8.2 04/19/2017 1207   RBC 3.72 06/17/2017 0959   RBC 3.79 (L) 04/19/2017 1207   HGB 11.0 (L) 06/17/2017 0959   HCT 33.4 (L) 06/17/2017 0959   PLT 304 06/17/2017 0959   PLT 300 02/11/2017 1632   MCV 89.7 06/17/2017 0959   MCH 29.5 06/17/2017 0959   MCH 30.6 04/19/2017 1207   MCHC 32.9 06/17/2017 0959   MCHC 34.9 04/19/2017 1207   RDW 17.6 (H) 06/17/2017 0959   LYMPHSABS 0.7 (L) 06/17/2017 0959   MONOABS 0.6 06/17/2017 0959   EOSABS 0.0 06/17/2017 0959   BASOSABS 0.0 06/17/2017 0959    CMP     Component Value Date/Time   NA 138 06/17/2017 0959   K 3.8 06/17/2017 0959   CL 99 (L) 04/19/2017 1207   CO2 26 06/17/2017 0959   GLUCOSE 112 06/17/2017 0959   BUN 7.4 06/17/2017 0959   CREATININE 0.8 06/17/2017 0959   CALCIUM 10.0 06/17/2017 0959   PROT 8.2 06/17/2017 0959   ALBUMIN 3.1 (L) 06/17/2017 0959   AST 23 06/17/2017 0959   ALT 13 06/17/2017 0959   ALKPHOS 74 06/17/2017 0959   BILITOT 0.63 06/17/2017 0959   GFRNONAA >60 04/19/2017 1207   GFRAA >60 04/19/2017 1207     RADIOGRAPHIC STUDIES:  No results found.  ASSESSMENT and THERAPY PLAN:   Metastatic primary lung cancer, right [facility]) This is a  very pleasant 56 year old African-American female with metastatic non-small cell lung cancer that was initially diagnosed as stage IIIa non-small cell lung cancer, squamous cell carcinoma presented with large right hilar mass and mediastinal lymphadenopathy diagnosed in November 2016. She status post concurrent chemoradiation followed by consolidation chemotherapy. The patient was found recently to have  intramuscular metastases and the right posterior chest wall that was biopsy-proven to be squamous cell carcinoma with PDL 1 expression50%. Status post radiation to the posterior chest mass with significant improvement in her pain.  Recommend that she proceed with cycle 2 of hertreatment with immunotherapy with Keytruda (pembrolizumab) 200 mg IV every 3 weeks.  For pain management, she will continue oxycodone as needed for pain. Refill of oxycodone given today.  Follow-up visit will be in 3 weeks for valuation prior to cycle 3 of Keytruda.   No orders of the defined types were placed in this encounter.   All questions were answered. The patient knows to call the clinic with any problems, questions or concerns. We can certainly see the patient much sooner if necessary.  Mikey Bussing, NP 06/17/2017

## 2017-06-18 ENCOUNTER — Telehealth: Payer: Self-pay | Admitting: Internal Medicine

## 2017-06-18 NOTE — Telephone Encounter (Signed)
Called patient regarding October and November

## 2017-06-23 ENCOUNTER — Ambulatory Visit: Payer: Medicare Other | Admitting: Internal Medicine

## 2017-06-23 ENCOUNTER — Ambulatory Visit: Payer: Medicare Other

## 2017-06-23 ENCOUNTER — Other Ambulatory Visit: Payer: Medicare Other

## 2017-07-08 ENCOUNTER — Other Ambulatory Visit: Payer: Medicare Other

## 2017-07-08 ENCOUNTER — Other Ambulatory Visit (HOSPITAL_BASED_OUTPATIENT_CLINIC_OR_DEPARTMENT_OTHER): Payer: Medicare Other

## 2017-07-08 ENCOUNTER — Ambulatory Visit (HOSPITAL_BASED_OUTPATIENT_CLINIC_OR_DEPARTMENT_OTHER): Payer: Medicare Other | Admitting: Internal Medicine

## 2017-07-08 ENCOUNTER — Ambulatory Visit: Payer: Medicare Other

## 2017-07-08 ENCOUNTER — Other Ambulatory Visit: Payer: Self-pay | Admitting: Medical Oncology

## 2017-07-08 ENCOUNTER — Ambulatory Visit: Payer: Medicare Other | Admitting: Nutrition

## 2017-07-08 ENCOUNTER — Ambulatory Visit (HOSPITAL_BASED_OUTPATIENT_CLINIC_OR_DEPARTMENT_OTHER): Payer: Medicare Other

## 2017-07-08 ENCOUNTER — Encounter: Payer: Self-pay | Admitting: Internal Medicine

## 2017-07-08 VITALS — BP 114/87 | HR 129 | Temp 98.4°F | Resp 18 | Ht 68.0 in | Wt 207.3 lb

## 2017-07-08 DIAGNOSIS — C7989 Secondary malignant neoplasm of other specified sites: Secondary | ICD-10-CM

## 2017-07-08 DIAGNOSIS — C3491 Malignant neoplasm of unspecified part of right bronchus or lung: Secondary | ICD-10-CM

## 2017-07-08 DIAGNOSIS — E860 Dehydration: Secondary | ICD-10-CM

## 2017-07-08 DIAGNOSIS — R5382 Chronic fatigue, unspecified: Secondary | ICD-10-CM | POA: Diagnosis not present

## 2017-07-08 DIAGNOSIS — C3481 Malignant neoplasm of overlapping sites of right bronchus and lung: Secondary | ICD-10-CM

## 2017-07-08 DIAGNOSIS — Z5112 Encounter for antineoplastic immunotherapy: Secondary | ICD-10-CM

## 2017-07-08 DIAGNOSIS — C3490 Malignant neoplasm of unspecified part of unspecified bronchus or lung: Secondary | ICD-10-CM

## 2017-07-08 DIAGNOSIS — G893 Neoplasm related pain (acute) (chronic): Secondary | ICD-10-CM | POA: Diagnosis not present

## 2017-07-08 LAB — COMPREHENSIVE METABOLIC PANEL
ALBUMIN: 3.2 g/dL — AB (ref 3.5–5.0)
ALK PHOS: 76 U/L (ref 40–150)
ALT: 20 U/L (ref 0–55)
AST: 37 U/L — AB (ref 5–34)
Anion Gap: 10 mEq/L (ref 3–11)
BUN: 5.5 mg/dL — AB (ref 7.0–26.0)
CALCIUM: 9.5 mg/dL (ref 8.4–10.4)
CHLORIDE: 105 meq/L (ref 98–109)
CO2: 25 mEq/L (ref 22–29)
CREATININE: 0.7 mg/dL (ref 0.6–1.1)
EGFR: >60 mL/min/{1.73_m2} (ref >60)
Glucose: 116 mg/dl (ref 70–140)
Potassium: 3.4 mEq/L — ABNORMAL LOW (ref 3.5–5.1)
Sodium: 139 mEq/L (ref 136–145)
Total Bilirubin: 0.67 mg/dL (ref 0.20–1.20)
Total Protein: 7.8 g/dL (ref 6.4–8.3)

## 2017-07-08 LAB — CBC WITH DIFFERENTIAL/PLATELET
BASO%: 0.2 % (ref 0.0–2.0)
Basophils Absolute: 0 10*3/uL (ref 0.0–0.1)
EOS ABS: 0 10*3/uL (ref 0.0–0.5)
EOS%: 0.5 % (ref 0.0–7.0)
HCT: 32.2 % — ABNORMAL LOW (ref 34.8–46.6)
HEMOGLOBIN: 10.7 g/dL — AB (ref 11.6–15.9)
LYMPH#: 0.5 10*3/uL — AB (ref 0.9–3.3)
LYMPH%: 12.3 % — ABNORMAL LOW (ref 14.0–49.7)
MCH: 29.6 pg (ref 25.1–34.0)
MCHC: 33.2 g/dL (ref 31.5–36.0)
MCV: 89 fL (ref 79.5–101.0)
MONO#: 0.6 10*3/uL (ref 0.1–0.9)
MONO%: 13.2 % (ref 0.0–14.0)
NEUT%: 73.8 % (ref 38.4–76.8)
NEUTROS ABS: 3.1 10*3/uL (ref 1.5–6.5)
PLATELETS: 238 10*3/uL (ref 145–400)
RBC: 3.62 10*6/uL — ABNORMAL LOW (ref 3.70–5.45)
RDW: 18.4 % — ABNORMAL HIGH (ref 11.2–14.5)
WBC: 4.2 10*3/uL (ref 3.9–10.3)

## 2017-07-08 LAB — TSH: TSH: 0.596 m(IU)/L (ref 0.308–3.960)

## 2017-07-08 MED ORDER — HEPARIN SOD (PORK) LOCK FLUSH 100 UNIT/ML IV SOLN
500.0000 [IU] | Freq: Once | INTRAVENOUS | Status: AC | PRN
  Administered 2017-07-08: 500 [IU]
  Filled 2017-07-08: qty 5

## 2017-07-08 MED ORDER — SODIUM CHLORIDE 0.9% FLUSH
10.0000 mL | INTRAVENOUS | Status: DC | PRN
  Administered 2017-07-08: 10 mL
  Filled 2017-07-08: qty 10

## 2017-07-08 MED ORDER — OXYCODONE HCL 5 MG PO TABS: 5.0000 mg | ORAL_TABLET | Freq: Four times a day (QID) | ORAL | 0 refills | Status: DC | PRN

## 2017-07-08 MED ORDER — PEMBROLIZUMAB CHEMO INJECTION 100 MG/4ML
200.0000 mg | Freq: Once | INTRAVENOUS | Status: AC
  Administered 2017-07-08: 200 mg via INTRAVENOUS
  Filled 2017-07-08: qty 8

## 2017-07-08 MED ORDER — SODIUM CHLORIDE 0.9 % IV SOLN
Freq: Once | INTRAVENOUS | Status: AC
  Administered 2017-07-08: 15:00:00 via INTRAVENOUS

## 2017-07-08 NOTE — Progress Notes (Signed)
Brief nutrition follow-up completed with patient prior to infusion for non-small cell lung cancer. Weight decreased slightly to 207 pounds down from 209.9 pounds September 27. Patient reports she is eating well now and states nutrition impact symptoms have resolved. She has no nutrition needs at this time.  Nutrition diagnosis: Food and nutrition related knowledge deficit resolved.  Encouraged patient to contact me if she develops any further problems with eating during treatment.  She has my contact information.  **Disclaimer: This note was dictated with voice recognition software. Similar sounding words can inadvertently be transcribed and this note may contain transcription errors which may not have been corrected upon publication of note.**

## 2017-07-08 NOTE — Patient Instructions (Signed)
[facility] Discharge Instructions for Patients Receiving Chemotherapy  Today you received the following chemotherapy agents: Keytruda   To help prevent nausea and vomiting after your treatment, we encourage you to take your nausea medication as directed    If you develop nausea and vomiting that is not controlled by your nausea medication, call the clinic.   BELOW ARE SYMPTOMS THAT SHOULD BE REPORTED IMMEDIATELY:  *FEVER GREATER THAN 100.5 F  *CHILLS WITH OR WITHOUT FEVER  NAUSEA AND VOMITING THAT IS NOT CONTROLLED WITH YOUR NAUSEA MEDICATION  *UNUSUAL SHORTNESS OF BREATH  *UNUSUAL BRUISING OR BLEEDING  TENDERNESS IN MOUTH AND THROAT WITH OR WITHOUT PRESENCE OF ULCERS  *URINARY PROBLEMS  *BOWEL PROBLEMS  UNUSUAL RASH Items with * indicate a potential emergency and should be followed up as soon as possible.  Feel free to call the clinic you have any questions or concerns. The clinic phone number is [phone].  Please show the CHEMO ALERT CARD at check-in to the Emergency Department and triage nurse.   

## 2017-07-08 NOTE — Progress Notes (Signed)
[facility] Telephone:([phone]   [phone]  OFFICE PROGRESS NOTE  [name], PA-C [place] [address]  DIAGNOSIS: Metastatic non-small cell lung cancer initially diagnosed as Stage IIIA (T2b, N2, M0) non-small cell lung cancer, invasive poorly differentiated squamous cell carcinoma diagnosed in November 2016 and presented with large right hilar mass with collapse of the right middle lobe and extension into the right upper lobe and right lower lobe along the hilum with right paratracheal and subcarinal lymphadenopathy. She presented in July 2018 with metastatic intramuscular mass in the right posterior lateral chest wall, with biopsy confirmed squamous cell carcinoma. PDL1 Expression: 50%.  PRIOR THERAPY:  1) Concurrent chemoradiation with weekly carboplatin for AUC of 2 and paclitaxel 45 MG/M2. First dose 08/19/2015. Status post 5 cycles. Last cycle was given 10/07/2015 with significant response. 2) Consolidation chemotherapy with carboplatin for AUC of 5 and paclitaxel 175 MG/M2 every 3 weeks with Neulasta support. First dose 11/18/2015. Status post 3 cycles. 3) palliative radiotherapy to the intramuscular mass in the right posterior lateral chest wall under the care of Dr. Tammi Klippel.  CURRENT THERAPY: Ketruda 200 mg IV every 3 weeks. First dose 05/12/2017. Status post 2 cycles.  INTERVAL HISTORY: Theresa [name] 56 y.o. female returns to the clinic today for follow-up visit accompanied by her daughter. The patient is feeling fine today with no specific complaints. The pain in her back has significantly improved but she still need Percocet on as-needed basis. She denied having any chest pain, shortness breath, cough or hemoptysis. She denied having any fever or chills. She has no nausea, vomiting, diarrhea or constipation. She continues to tolerate her treatment with [facility] fairly well. She is here for evaluation before starting cycle  #3.   MEDICAL HISTORY: Past Medical History:  Diagnosis Date  . Cancer ([place])    5 years ago - cervical cancer  . Encounter for antineoplastic chemotherapy 08/27/2015  . History of radiation therapy 08/19/2015 - 10/07/2015   Site/dose:   The patient's primary tumor and involved lymph nodes were treated to 66 Gy in 30 fractions.  . Hypokalemia 09/30/2015  . Non-small cell carcinoma of lung, stage 3 ([place]) 08/08/2015  . Paroxysmal atrial flutter ([place]) 09/16/2015  . Pneumonia     ALLERGIES:  has No Known Allergies.  MEDICATIONS:  Current Outpatient Prescriptions  Medication Sig Dispense Refill  . docusate sodium (COLACE) 100 MG capsule Take 1 capsule (100 mg total) by mouth 2 (two) times daily. 60 capsule 1  . fentaNYL (DURAGESIC - DOSED MCG/HR) 50 MCG/HR Place 1 patch (50 mcg total) onto the skin every 3 (three) days. 10 patch 0  . lactulose (CHRONULAC) 10 GM/15ML solution Take 15 mLs (10 g total) by mouth 3 (three) times daily as needed for mild constipation. 240 mL 3  . lidocaine-prilocaine (EMLA) cream Apply 1 application topically as needed. 30 g 2  . linaclotide (LINZESS) 145 MCG CAPS capsule Take 145 mcg by mouth daily before breakfast.    . magnesium hydroxide (MILK OF MAGNESIA) 400 MG/5ML suspension Take 5 mLs by mouth at bedtime as needed for mild constipation.    . ondansetron (ZOFRAN ODT) 4 MG disintegrating tablet Take 1 tablet (4 mg total) by mouth every 8 (eight) hours as needed for nausea or vomiting. 20 tablet 5  . ondansetron (ZOFRAN) 4 MG tablet Take 1 tablet (4 mg total) by mouth every 8 (eight) hours as needed for nausea or vomiting. 6 tablet 0  .  ondansetron (ZOFRAN) 8 MG tablet Take 1 tablet (8 mg total) by mouth every 8 (eight) hours as needed for nausea or vomiting. 20 tablet 0  . oxyCODONE (OXY IR/ROXICODONE) 5 MG immediate release tablet Take 1-2 tablets (5-10 mg total) by mouth every 6 (six) hours as needed for severe pain. 60 tablet 0  . prochlorperazine  (COMPAZINE) 10 MG tablet Take 1 tablet (10 mg total) by mouth every 6 (six) hours as needed for nausea or vomiting. (Patient not taking: Reported on 05/31/2017) 30 tablet 0  . Wound Cleansers (RADIAPLEX EX) Apply topically.     No current facility-administered medications for this visit.    Facility-Administered Medications Ordered in Other Visits  Medication Dose Route Frequency Provider Last Rate Last Dose  . 0.9 %  sodium chloride infusion   Intravenous Once Curt Bears, MD      . 0.9 %  sodium chloride infusion   Intravenous Continuous Curcio, Erasmo Downer R, NP      . 0.9 %  sodium chloride infusion   Intravenous Continuous Curt Bears, MD   Stopped at 05/31/17 1714  . ondansetron (ZOFRAN) injection 8 mg  8 mg Intravenous Once Curt Bears, MD        SURGICAL HISTORY:  Past Surgical History:  Procedure Laterality Date  . IR FLUORO GUIDE PORT INSERTION RIGHT  05/21/2017  . IR US GUIDE VASC ACCESS RIGHT  05/21/2017  . TUBAL LIGATION    . VIDEO BRONCHOSCOPY WITH ENDOBRONCHIAL ULTRASOUND N/A 08/02/2015   Procedure: VIDEO BRONCHOSCOPY WITH ENDOBRONCHIAL ULTRASOUND;  Surgeon: [name], MD;  Location: [facility];  Service: Thoracic;  Laterality: N/A;  . WISDOM TOOTH EXTRACTION      REVIEW OF SYSTEMS:  A comprehensive review of systems was negative except for: Constitutional: positive for fatigue Musculoskeletal: positive for back pain   PHYSICAL EXAMINATION: General appearance: alert, cooperative, fatigued and no distress Head: Normocephalic, without obvious abnormality, atraumatic Neck: no adenopathy, no JVD, supple, symmetrical, trachea midline and thyroid not enlarged, symmetric, no tenderness/mass/nodules Lymph nodes: Cervical, supraclavicular, and axillary nodes normal. Resp: clear to auscultation bilaterally Back: symmetric, no curvature. ROM normal. No CVA tenderness. Cardio: regular rate and rhythm, S1, S2 normal, no murmur, click, rub or gallop GI: soft,  non-tender; bowel sounds normal; no masses,  no organomegaly Extremities: extremities normal, atraumatic, no cyanosis or edema  Examination of the back showed a palpable 8.0 cm mass above the right scapula.  ECOG PERFORMANCE STATUS: 1 - Symptomatic but completely ambulatory  Blood pressure 114/87, pulse (!) 129, temperature 98.4 F (36.9 C), temperature source Oral, resp. rate 18, height '5\' 8"'  (1.727 m), weight 207 lb 4.8 oz (94 kg), SpO2 100 %.  LABORATORY DATA: Lab Results  Component Value Date   WBC 4.2 07/08/2017   HGB 10.7 (L) 07/08/2017   HCT 32.2 (L) 07/08/2017   MCV 89.0 07/08/2017   PLT 238 07/08/2017      Chemistry      Component Value Date/Time   NA 139 07/08/2017 1210   K 3.4 (L) 07/08/2017 1210   CL 99 (L) 04/19/2017 1207   CO2 25 07/08/2017 1210   BUN 5.5 (L) 07/08/2017 1210   CREATININE 0.7 07/08/2017 1210      Component Value Date/Time   CALCIUM 9.5 07/08/2017 1210   ALKPHOS 76 07/08/2017 1210   AST 37 (H) 07/08/2017 1210   ALT 20 07/08/2017 1210   BILITOT 0.67 07/08/2017 1210       RADIOGRAPHIC STUDIES: No results found.  ASSESSMENT AND PLAN:  This is a very pleasant 56 years old African-American female with metastatic non-small cell lung cancer that was initially diagnosed as stage IIIa non-small cell lung cancer, squamous cell carcinoma presented with large right hilar mass and mediastinal lymphadenopathy diagnosed in November 2016. She status post concurrent chemoradiation followed by consolidation chemotherapy. The patient was found recently to have intramuscular metastases and the right posterior chest wall that was biopsy-proven to be squamous cell carcinoma with PDL 1 expression 50%. The patient is currently undergoing treatment with immunotherapy with Ketruda (pembrolizumab) status post 2 cycles and tolerated the first 2 cycles of her treatment fairly well. I recommended for her to proceed with cycle #3 today as scheduled. I will see her back for  follow-up visit in 3 weeks for evaluation after repeating CT scan of the chest, abdomen and pelvis for restaging of her disease. For pain management, she is status post palliative radiotherapy to the back muscular mass. She will also continue her current pain medication with fentanyl patch 50 g/hour every 3 days and Percocet for the breakthrough pain.  The patient was advised to call immediately if she has any concerning symptoms in the interval. The patient voices understanding of current disease status and treatment options and is in agreement with the current care plan. All questions were answered. The patient knows to call the clinic with any problems, questions or concerns. We can certainly see the patient much sooner if necessary.  Disclaimer: This note was dictated with voice recognition software. Similar sounding words can inadvertently be transcribed and may not be corrected upon review.

## 2017-07-09 ENCOUNTER — Telehealth: Payer: Self-pay

## 2017-07-09 NOTE — Telephone Encounter (Signed)
Called patient with upcoming appointment for CT, lab, MD. Per 10/18 los

## 2017-07-22 ENCOUNTER — Telehealth: Payer: Self-pay

## 2017-07-22 NOTE — Telephone Encounter (Signed)
Called pt to schedule Medicare Annual Wellness Visit. -nr  

## 2017-07-26 ENCOUNTER — Ambulatory Visit: Payer: Medicare Other

## 2017-07-27 ENCOUNTER — Ambulatory Visit (HOSPITAL_BASED_OUTPATIENT_CLINIC_OR_DEPARTMENT_OTHER): Payer: Medicare Other

## 2017-07-27 ENCOUNTER — Other Ambulatory Visit (HOSPITAL_BASED_OUTPATIENT_CLINIC_OR_DEPARTMENT_OTHER): Payer: Medicare Other

## 2017-07-27 ENCOUNTER — Ambulatory Visit (HOSPITAL_COMMUNITY)
Admission: RE | Admit: 2017-07-27 | Discharge: 2017-07-27 | Disposition: A | Discharge status: Home / Self Care | Payer: Medicare Other | Source: Ambulatory Visit | Attending: Internal Medicine | Admitting: Internal Medicine

## 2017-07-27 DIAGNOSIS — Z452 Encounter for adjustment and management of vascular access device: Secondary | ICD-10-CM

## 2017-07-27 DIAGNOSIS — C3491 Malignant neoplasm of unspecified part of right bronchus or lung: Secondary | ICD-10-CM

## 2017-07-27 DIAGNOSIS — Z79899 Other long term (current) drug therapy: Secondary | ICD-10-CM | POA: Diagnosis not present

## 2017-07-27 DIAGNOSIS — C3481 Malignant neoplasm of overlapping sites of right bronchus and lung: Secondary | ICD-10-CM | POA: Diagnosis not present

## 2017-07-27 DIAGNOSIS — J90 Pleural effusion, not elsewhere classified: Secondary | ICD-10-CM | POA: Diagnosis not present

## 2017-07-27 DIAGNOSIS — E860 Dehydration: Secondary | ICD-10-CM

## 2017-07-27 DIAGNOSIS — I700 Atherosclerosis of aorta: Secondary | ICD-10-CM | POA: Diagnosis not present

## 2017-07-27 DIAGNOSIS — C3490 Malignant neoplasm of unspecified part of unspecified bronchus or lung: Secondary | ICD-10-CM

## 2017-07-27 DIAGNOSIS — Z5112 Encounter for antineoplastic immunotherapy: Secondary | ICD-10-CM

## 2017-07-27 DIAGNOSIS — R5382 Chronic fatigue, unspecified: Secondary | ICD-10-CM

## 2017-07-27 LAB — CBC WITH DIFFERENTIAL/PLATELET
BASO%: 0.6 % (ref 0.0–2.0)
Basophils Absolute: 0 10*3/uL (ref 0.0–0.1)
EOS ABS: 0 10*3/uL (ref 0.0–0.5)
EOS%: 0.1 % (ref 0.0–7.0)
HCT: 32 % — ABNORMAL LOW (ref 34.8–46.6)
HEMOGLOBIN: 10.7 g/dL — AB (ref 11.6–15.9)
LYMPH%: 11.5 % — ABNORMAL LOW (ref 14.0–49.7)
MCH: 30.3 pg (ref 25.1–34.0)
MCHC: 33.3 g/dL (ref 31.5–36.0)
MCV: 91 fL (ref 79.5–101.0)
MONO#: 0.6 10*3/uL (ref 0.1–0.9)
MONO%: 12.4 % (ref 0.0–14.0)
NEUT%: 75.4 % (ref 38.4–76.8)
NEUTROS ABS: 3.8 10*3/uL (ref 1.5–6.5)
PLATELETS: 255 10*3/uL (ref 145–400)
RBC: 3.52 10*6/uL — ABNORMAL LOW (ref 3.70–5.45)
RDW: 19.4 % — AB (ref 11.2–14.5)
WBC: 5.1 10*3/uL (ref 3.9–10.3)
lymph#: 0.6 10*3/uL — ABNORMAL LOW (ref 0.9–3.3)

## 2017-07-27 LAB — COMPREHENSIVE METABOLIC PANEL
ALBUMIN: 3.4 g/dL — AB (ref 3.5–5.0)
ALK PHOS: 74 U/L (ref 40–150)
ALT: 17 U/L (ref 0–55)
AST: 38 U/L — ABNORMAL HIGH (ref 5–34)
Anion Gap: 9 mEq/L (ref 3–11)
BILIRUBIN TOTAL: 0.7 mg/dL (ref 0.20–1.20)
BUN: 7.9 mg/dL (ref 7.0–26.0)
CO2: 26 mEq/L (ref 22–29)
CREATININE: 0.7 mg/dL (ref 0.6–1.1)
Calcium: 9.8 mg/dL (ref 8.4–10.4)
Chloride: 102 mEq/L (ref 98–109)
GLUCOSE: 106 mg/dL (ref 70–140)
Potassium: 3.4 mEq/L — ABNORMAL LOW (ref 3.5–5.1)
SODIUM: 137 meq/L (ref 136–145)
TOTAL PROTEIN: 8.1 g/dL (ref 6.4–8.3)

## 2017-07-27 LAB — TSH: TSH: <0.08 m(IU)/L — ABNORMAL LOW (ref 0.308–3.960)

## 2017-07-27 MED ORDER — IOPAMIDOL (ISOVUE-300) INJECTION 61%
INTRAVENOUS | Status: AC
  Filled 2017-07-27: qty 100

## 2017-07-27 MED ORDER — HEPARIN SOD (PORK) LOCK FLUSH 100 UNIT/ML IV SOLN
500.0000 [IU] | Freq: Once | INTRAVENOUS | Status: AC
  Administered 2017-07-27: 500 [IU] via INTRAVENOUS

## 2017-07-27 MED ORDER — SODIUM CHLORIDE 0.9% FLUSH
10.0000 mL | INTRAVENOUS | Status: DC | PRN
  Administered 2017-07-27: 10 mL
  Filled 2017-07-27: qty 10

## 2017-07-27 MED ORDER — HEPARIN SOD (PORK) LOCK FLUSH 100 UNIT/ML IV SOLN
INTRAVENOUS | Status: AC
  Filled 2017-07-27: qty 5

## 2017-07-27 MED ORDER — IOPAMIDOL (ISOVUE-300) INJECTION 61%
100.0000 mL | Freq: Once | INTRAVENOUS | Status: AC | PRN
  Administered 2017-07-27: 100 mL via INTRAVENOUS

## 2017-07-29 ENCOUNTER — Other Ambulatory Visit: Payer: Self-pay | Admitting: Internal Medicine

## 2017-07-29 ENCOUNTER — Encounter: Payer: Self-pay | Admitting: Internal Medicine

## 2017-07-29 ENCOUNTER — Encounter: Payer: Self-pay | Admitting: *Deleted

## 2017-07-29 ENCOUNTER — Other Ambulatory Visit: Payer: Medicare Other

## 2017-07-29 ENCOUNTER — Telehealth: Payer: Self-pay | Admitting: Internal Medicine

## 2017-07-29 ENCOUNTER — Other Ambulatory Visit: Payer: Self-pay | Admitting: Medical Oncology

## 2017-07-29 ENCOUNTER — Ambulatory Visit (HOSPITAL_BASED_OUTPATIENT_CLINIC_OR_DEPARTMENT_OTHER): Payer: Medicare Other | Admitting: Internal Medicine

## 2017-07-29 ENCOUNTER — Ambulatory Visit: Payer: Medicare Other

## 2017-07-29 VITALS — BP 111/77 | HR 140 | Temp 98.3°F | Resp 18 | Ht 68.0 in | Wt 203.7 lb

## 2017-07-29 DIAGNOSIS — C782 Secondary malignant neoplasm of pleura: Secondary | ICD-10-CM

## 2017-07-29 DIAGNOSIS — C342 Malignant neoplasm of middle lobe, bronchus or lung: Secondary | ICD-10-CM

## 2017-07-29 DIAGNOSIS — C3401 Malignant neoplasm of right main bronchus: Secondary | ICD-10-CM

## 2017-07-29 DIAGNOSIS — Z5112 Encounter for antineoplastic immunotherapy: Secondary | ICD-10-CM

## 2017-07-29 DIAGNOSIS — C7989 Secondary malignant neoplasm of other specified sites: Secondary | ICD-10-CM | POA: Diagnosis not present

## 2017-07-29 DIAGNOSIS — C3491 Malignant neoplasm of unspecified part of right bronchus or lung: Secondary | ICD-10-CM

## 2017-07-29 DIAGNOSIS — D630 Anemia in neoplastic disease: Secondary | ICD-10-CM

## 2017-07-29 DIAGNOSIS — R000 Tachycardia, unspecified: Secondary | ICD-10-CM

## 2017-07-29 DIAGNOSIS — Z5111 Encounter for antineoplastic chemotherapy: Secondary | ICD-10-CM

## 2017-07-29 MED ORDER — MULTIVITAMINS PO CAPS: 1.0000 | ORAL_CAPSULE | Freq: Every day | ORAL | 2 refills | Status: AC

## 2017-07-29 NOTE — Telephone Encounter (Signed)
Scheduled appt per 11/8 los - patient did not want any print outs - aware of appt date and time.

## 2017-07-29 NOTE — Progress Notes (Signed)
Theresa [name]   [phone]  OFFICE PROGRESS NOTE  [name], PA-C [place] [address]  DIAGNOSIS: Metastatic non-small cell lung cancer initially diagnosed as Stage IIIA (T2b, N2, M0) non-small cell lung cancer, invasive poorly differentiated squamous cell carcinoma diagnosed in November 2016 and presented with large right hilar mass with collapse of the right middle lobe and extension into the right upper lobe and right lower lobe along the hilum with right paratracheal and subcarinal lymphadenopathy. She presented in July 2018 with metastatic intramuscular mass in the right posterior lateral chest wall, with biopsy confirmed squamous cell carcinoma. PDL1 Expression: 50%.  PRIOR THERAPY:  1) Concurrent chemoradiation with weekly carboplatin for AUC of 2 and paclitaxel 45 MG/M2. First dose 08/19/2015. Status post 5 cycles. Last cycle was given 10/07/2015 with significant response. 2) Consolidation chemotherapy with carboplatin for AUC of 5 and paclitaxel 175 MG/M2 every 3 weeks with Neulasta support. First dose 11/18/2015. Status post 3 cycles. 3) palliative radiotherapy to the intramuscular mass in the right posterior lateral chest wall under the care of Dr. Tammi Klippel. 4) Ketruda 200 mg IV every 3 weeks. First dose 05/12/2017. Status post 3 cycles.  This was discontinued secondary to disease progression.  CURRENT THERAPY: None.   INTERVAL HISTORY: Theresa [name] 56 y.o. female returns to the clinic today for follow-up visit.  The patient is feeling fine today with no specific complaints.  She continues to tolerate her treatment with Keytruda fairly well.  She denied having any chest pain but has some mild shortness of breath with exertion with no cough or hemoptysis.  She denied having any fever or chills.  She has no nausea, vomiting, diarrhea but has constipation.  She denied having any recent weight loss or night sweats.   She has no headache or visual changes.  She had a repeat CT scan of the chest, abdomen and pelvis performed recently and she is here for evaluation and discussion of her risk results.   MEDICAL HISTORY: Past Medical History:  Diagnosis Date  . Cancer ([place])    5 years ago - cervical cancer  . Encounter for antineoplastic chemotherapy 08/27/2015  . History of radiation therapy 08/19/2015 - 10/07/2015   Site/dose:   The patient's primary tumor and involved lymph nodes were treated to 66 Gy in 30 fractions.  . Hypokalemia 09/30/2015  . Non-small cell carcinoma of lung, stage 3 ([place]) 08/08/2015  . Paroxysmal atrial flutter ([place]) 09/16/2015  . Pneumonia     ALLERGIES:  has No Known Allergies.  MEDICATIONS:  Current Outpatient Medications  Medication Sig Dispense Refill  . lidocaine-prilocaine (EMLA) cream Apply 1 application topically as needed. 30 g 2  . oxyCODONE (OXY IR/ROXICODONE) 5 MG immediate release tablet Take 1-2 tablets (5-10 mg total) by mouth every 6 (six) hours as needed for severe pain. 60 tablet 0  . docusate sodium (COLACE) 100 MG capsule Take 1 capsule (100 mg total) by mouth 2 (two) times daily. (Patient not taking: Reported on 07/29/2017) 60 capsule 1  . fentaNYL (DURAGESIC - DOSED MCG/HR) 50 MCG/HR Place 1 patch (50 mcg total) onto the skin every 3 (three) days. (Patient not taking: Reported on 07/08/2017) 10 patch 0  . lactulose (CHRONULAC) 10 GM/15ML solution Take 15 mLs (10 g total) by mouth 3 (three) times daily as needed for mild constipation. (Patient not taking: Reported on 07/29/2017) 240 mL 3  . linaclotide (LINZESS) 145 MCG CAPS capsule Take  145 mcg by mouth daily before breakfast.    . magnesium hydroxide (MILK OF MAGNESIA) 400 MG/5ML suspension Take 5 mLs by mouth at bedtime as needed for mild constipation.    . ondansetron (ZOFRAN ODT) 4 MG disintegrating tablet Take 1 tablet (4 mg total) by mouth every 8 (eight) hours as needed for nausea or vomiting. (Patient not  taking: Reported on 07/08/2017) 20 tablet 5  . ondansetron (ZOFRAN) 4 MG tablet Take 1 tablet (4 mg total) by mouth every 8 (eight) hours as needed for nausea or vomiting. (Patient not taking: Reported on 07/08/2017) 6 tablet 0  . ondansetron (ZOFRAN) 8 MG tablet Take 1 tablet (8 mg total) by mouth every 8 (eight) hours as needed for nausea or vomiting. (Patient not taking: Reported on 07/08/2017) 20 tablet 0  . prochlorperazine (COMPAZINE) 10 MG tablet Take 1 tablet (10 mg total) by mouth every 6 (six) hours as needed for nausea or vomiting. (Patient not taking: Reported on 05/31/2017) 30 tablet 0   No current facility-administered medications for this visit.    Facility-Administered Medications Ordered in Other Visits  Medication Dose Route Frequency Provider Last Rate Last Dose  . 0.9 %  sodium chloride infusion   Intravenous Once Curt Bears, MD      . 0.9 %  sodium chloride infusion   Intravenous Continuous Curcio, Erasmo Downer R, NP      . 0.9 %  sodium chloride infusion   Intravenous Continuous Curt Bears, MD   Stopped at 05/31/17 1714  . ondansetron (ZOFRAN) injection 8 mg  8 mg Intravenous Once Curt Bears, MD        SURGICAL HISTORY:  Past Surgical History:  Procedure Laterality Date  . IR FLUORO GUIDE PORT INSERTION RIGHT  05/21/2017  . IR US GUIDE VASC ACCESS RIGHT  05/21/2017  . TUBAL LIGATION    . WISDOM TOOTH EXTRACTION      REVIEW OF SYSTEMS:  Constitutional: positive for fatigue Eyes: negative Ears, nose, mouth, throat, and face: negative Respiratory: positive for dyspnea on exertion Cardiovascular: negative Gastrointestinal: negative Genitourinary:negative Integument/breast: negative Hematologic/lymphatic: negative Musculoskeletal:negative Neurological: negative Behavioral/Psych: negative Endocrine: negative Allergic/Immunologic: negative   PHYSICAL EXAMINATION: General appearance: alert, cooperative, fatigued and no distress Head: Normocephalic,  without obvious abnormality, atraumatic Neck: no adenopathy, no JVD, supple, symmetrical, trachea midline and thyroid not enlarged, symmetric, no tenderness/mass/nodules Lymph nodes: Cervical, supraclavicular, and axillary nodes normal. Resp: diminished breath sounds RLL and dullness to percussion RLL Back: symmetric, no curvature. ROM normal. No CVA tenderness. Cardio: regular rate and rhythm, S1, S2 normal, no murmur, click, rub or gallop GI: soft, non-tender; bowel sounds normal; no masses,  no organomegaly Extremities: extremities normal, atraumatic, no cyanosis or edema Neurologic: Alert and oriented X 3, normal strength and tone. Normal symmetric reflexes. Normal coordination and gait  Examination of the back showed a palpable mass above the right scapula.  ECOG PERFORMANCE STATUS: 1 - Symptomatic but completely ambulatory  Blood pressure 111/77, pulse (!) 140, temperature 98.3 F (36.8 C), temperature source Oral, resp. rate 18, height '5\' 8"'  (1.727 m), weight 203 lb 11.2 oz (92.4 kg), SpO2 100 %.  LABORATORY DATA: Lab Results  Component Value Date   WBC 5.1 07/27/2017   HGB 10.7 (L) 07/27/2017   HCT 32.0 (L) 07/27/2017   MCV 91.0 07/27/2017   PLT 255 07/27/2017      Chemistry      Component Value Date/Time   NA 137 07/27/2017 1107   K 3.4 (L) 07/27/2017 1107  CL 99 (L) 04/19/2017 1207   CO2 26 07/27/2017 1107   BUN 7.9 07/27/2017 1107   CREATININE 0.7 07/27/2017 1107      Component Value Date/Time   CALCIUM 9.8 07/27/2017 1107   ALKPHOS 74 07/27/2017 1107   AST 38 (H) 07/27/2017 1107   ALT 17 07/27/2017 1107   BILITOT 0.70 07/27/2017 1107       RADIOGRAPHIC STUDIES: Ct Chest W Contrast  Result Date: 07/27/2017 CLINICAL DATA:  Metastatic right-sided lung cancer. Encounter for immunotherapy. Non-small-cell. Staging. Right scapular soft tissue lump/ mass for 2-3 months. EXAM: CT CHEST, ABDOMEN, AND PELVIS WITH CONTRAST TECHNIQUE: Multidetector CT imaging of the  chest, abdomen and pelvis was performed following the standard protocol during bolus administration of intravenous contrast. CONTRAST:  161m ISOVUE-300 IOPAMIDOL (ISOVUE-300) INJECTION 61% COMPARISON:  05/05/2017 PET. 04/19/2017 chest CT. Most recent abdominopelvic CT of 03/11/2017. FINDINGS: CT CHEST FINDINGS Cardiovascular: A right-sided Port-A-Cath which terminates at the high right atrium. Normal heart size, without pericardial effusion. No central pulmonary embolism, on this non-dedicated study. Abberant right subclavian artery, traversing posterior to the esophagus. Mediastinum/Nodes: No supraclavicular adenopathy. No mediastinal or definite hilar adenopathy Right internal mammary node versus anterior pleural implant. This is new, including at 1.5 cm on image 32/series 2. Lungs/Pleura: Since the prior CT, development of moderate right-sided pleural effusion. Pleural nodularity persists, with example area in the upper right hemithorax measuring 10 mm on image 75/series 2. Increased compression of right-sided endobronchial tree. Worsened right-sided aeration. Increased right perihilar consolidation and traction bronchiectasis are likely radiation induced. Persistent right lower lobe volume loss and atelectasis. Anterior right lung base nodule measures 6 mm on image 84/series 6 and is felt to be new. Posterior right apical soft tissue density is favored to represent pleural-based mass mass over atelectasis. This is new, including at 5.0 by 3.5 cm on image 8/series 2. Musculoskeletal: Right chest wall mass with increased central necrosis. Measures on the order of 10.7 x 5.6 cm on image 35/series 2. Compare 8.9 x 5.3 cm on the prior exam. Direct extension versus adjacent adenopathy within the posterior inferior axilla, new at 1.8 cm on image 26/series 2. The right chest wall mass is contiguous with direct tumor extending from the right pleural space, as before. Example image 38/series 2. CT ABDOMEN PELVIS FINDINGS  Hepatobiliary: High left hepatic lobe low-density lesions are likely cysts. Other lesions are too small to characterize but similar. Normal gallbladder, without biliary ductal dilatation. Pancreas: Normal, without mass or ductal dilatation. Spleen: Normal in size, without focal abnormality. Adrenals/Urinary Tract: Normal adrenal glands. Normal kidneys, without hydronephrosis. Decompressed urinary bladder. Stomach/Bowel: Proximal gastric underdistention. The cecum extends into the central pelvis. Normal terminal ileum and appendix. Normal small bowel. Vascular/Lymphatic: Aortic and branch vessel atherosclerosis. No abdominopelvic adenopathy. Reproductive: Normal uterus. Right ovarian dermoid at 4.1 cm is grossly similar. Likely a smaller adjacent 1.7 cm right-sided dermoid including on image 107/series 2. Alternatively, this could represent 1 bilobed lesion. Other: No significant free fluid. No evidence of omental or peritoneal disease. Musculoskeletal: No acute osseous abnormality. IMPRESSION: 1. Since the prior diagnostic CT of 04/19/2017, disease progression within the chest. Increase in dominant right chest wall mass and right pleural based disease. Development of moderate right pleural effusion with worsened right-sided aeration. Suspect a a new anterior right lung base pulmonary nodule/metastasis. 2. No subdiaphragmatic metastatic disease identified. 3. Right ovarian dermoid or dermoids, similar. 4.  Aortic Atherosclerosis (ICD10-I70.0). Electronically Signed   By: KAdria DevonD.  On: 07/27/2017 15:26   Ct Abdomen Pelvis W Contrast  Result Date: 07/27/2017 CLINICAL DATA:  Metastatic right-sided lung cancer. Encounter for immunotherapy. Non-small-cell. Staging. Right scapular soft tissue lump/ mass for 2-3 months. EXAM: CT CHEST, ABDOMEN, AND PELVIS WITH CONTRAST TECHNIQUE: Multidetector CT imaging of the chest, abdomen and pelvis was performed following the standard protocol during bolus administration  of intravenous contrast. CONTRAST:  141m ISOVUE-300 IOPAMIDOL (ISOVUE-300) INJECTION 61% COMPARISON:  05/05/2017 PET. 04/19/2017 chest CT. Most recent abdominopelvic CT of 03/11/2017. FINDINGS: CT CHEST FINDINGS Cardiovascular: A right-sided Port-A-Cath which terminates at the high right atrium. Normal heart size, without pericardial effusion. No central pulmonary embolism, on this non-dedicated study. Abberant right subclavian artery, traversing posterior to the esophagus. Mediastinum/Nodes: No supraclavicular adenopathy. No mediastinal or definite hilar adenopathy Right internal mammary node versus anterior pleural implant. This is new, including at 1.5 cm on image 32/series 2. Lungs/Pleura: Since the prior CT, development of moderate right-sided pleural effusion. Pleural nodularity persists, with example area in the upper right hemithorax measuring 10 mm on image 75/series 2. Increased compression of right-sided endobronchial tree. Worsened right-sided aeration. Increased right perihilar consolidation and traction bronchiectasis are likely radiation induced. Persistent right lower lobe volume loss and atelectasis. Anterior right lung base nodule measures 6 mm on image 84/series 6 and is felt to be new. Posterior right apical soft tissue density is favored to represent pleural-based mass mass over atelectasis. This is new, including at 5.0 by 3.5 cm on image 8/series 2. Musculoskeletal: Right chest wall mass with increased central necrosis. Measures on the order of 10.7 x 5.6 cm on image 35/series 2. Compare 8.9 x 5.3 cm on the prior exam. Direct extension versus adjacent adenopathy within the posterior inferior axilla, new at 1.8 cm on image 26/series 2. The right chest wall mass is contiguous with direct tumor extending from the right pleural space, as before. Example image 38/series 2. CT ABDOMEN PELVIS FINDINGS Hepatobiliary: High left hepatic lobe low-density lesions are likely cysts. Other lesions are too  small to characterize but similar. Normal gallbladder, without biliary ductal dilatation. Pancreas: Normal, without mass or ductal dilatation. Spleen: Normal in size, without focal abnormality. Adrenals/Urinary Tract: Normal adrenal glands. Normal kidneys, without hydronephrosis. Decompressed urinary bladder. Stomach/Bowel: Proximal gastric underdistention. The cecum extends into the central pelvis. Normal terminal ileum and appendix. Normal small bowel. Vascular/Lymphatic: Aortic and branch vessel atherosclerosis. No abdominopelvic adenopathy. Reproductive: Normal uterus. Right ovarian dermoid at 4.1 cm is grossly similar. Likely a smaller adjacent 1.7 cm right-sided dermoid including on image 107/series 2. Alternatively, this could represent 1 bilobed lesion. Other: No significant free fluid. No evidence of omental or peritoneal disease. Musculoskeletal: No acute osseous abnormality. IMPRESSION: 1. Since the prior diagnostic CT of 04/19/2017, disease progression within the chest. Increase in dominant right chest wall mass and right pleural based disease. Development of moderate right pleural effusion with worsened right-sided aeration. Suspect a a new anterior right lung base pulmonary nodule/metastasis. 2. No subdiaphragmatic metastatic disease identified. 3. Right ovarian dermoid or dermoids, similar. 4.  Aortic Atherosclerosis (ICD10-I70.0). Electronically Signed   By: KAbigail MiyamotoM.D.   On: 07/27/2017 15:26    ASSESSMENT AND PLAN:  This is a very pleasant 57years old African-American female with metastatic non-small cell lung cancer that was initially diagnosed as stage IIIa non-small cell lung cancer, squamous cell carcinoma presented with large right hilar mass and mediastinal lymphadenopathy diagnosed in November 2016. She status post concurrent chemoradiation followed by consolidation chemotherapy. The patient  was found recently to have intramuscular metastases and the right posterior chest wall  that was biopsy-proven to be squamous cell carcinoma with PDL 1 expression 50%. The patient was a started on treatment with immunotherapy with Ketruda (pembrolizumab) status post 3 cycles. She continues to tolerate her treatment with immunotherapy fairly well. She had repeat CT scan of the chest, abdomen and pelvis performed recently.  I personally and independently reviewed the scan images and discussed the results with the patient today.  Unfortunately had a scan showed evidence for disease progression within the chest with increase in the dominant right chest wall mass as well as right pleural-based disease and development of moderate right pleural effusion as well as new anterior right lung base pulmonary nodule. I recommended for the patient to discontinue her current treatment with Keytruda at this point. I discussed with the patient other treatment options including enrollment in the SWOG S1400 clinical trial versus systemic chemotherapy with carboplatin and paclitaxel versus palliative care. The patient is interested in consideration of the clinical trial and she will see the clinical research nurse later today for eligibility. I will see her back for follow-up visit in 2 weeks for evaluation and more detailed discussion of her treatment options based on her eligibility for the clinical trial. For pain management the patient will continue her current pain medication with Percocet.  She discontinued the fentanyl patch because of much better pain control. She was advised to call immediately if she has any concerning symptoms in the interval. The patient voices understanding of current disease status and treatment options and is in agreement with the current care plan. All questions were answered. The patient knows to call the clinic with any problems, questions or concerns. We can certainly see the patient much sooner if necessary.  Disclaimer: This note was dictated with voice recognition  software. Similar sounding words can inadvertently be transcribed and may not be corrected upon review.

## 2017-07-30 ENCOUNTER — Other Ambulatory Visit: Payer: Self-pay | Admitting: Medical Oncology

## 2017-07-30 DIAGNOSIS — C3491 Malignant neoplasm of unspecified part of right bronchus or lung: Secondary | ICD-10-CM

## 2017-07-30 MED ORDER — OXYCODONE HCL 5 MG PO TABS: 5.0000 mg | ORAL_TABLET | Freq: Four times a day (QID) | ORAL | 0 refills | Status: DC | PRN

## 2017-08-02 ENCOUNTER — Other Ambulatory Visit: Payer: Self-pay | Admitting: Radiology

## 2017-08-02 ENCOUNTER — Ambulatory Visit (HOSPITAL_COMMUNITY)
Admission: RE | Admit: 2017-08-02 | Discharge: 2017-08-02 | Disposition: A | Discharge status: Home / Self Care | Payer: Medicare Other | Source: Ambulatory Visit | Attending: Internal Medicine | Admitting: Internal Medicine

## 2017-08-02 ENCOUNTER — Ambulatory Visit (HOSPITAL_COMMUNITY)
Admission: RE | Admit: 2017-08-02 | Discharge: 2017-08-02 | Disposition: A | Discharge status: Home / Self Care | Payer: Medicare Other | Source: Ambulatory Visit | Attending: Radiology | Admitting: Radiology

## 2017-08-02 DIAGNOSIS — J181 Lobar pneumonia, unspecified organism: Secondary | ICD-10-CM | POA: Diagnosis not present

## 2017-08-02 DIAGNOSIS — J90 Pleural effusion, not elsewhere classified: Secondary | ICD-10-CM | POA: Insufficient documentation

## 2017-08-02 DIAGNOSIS — C342 Malignant neoplasm of middle lobe, bronchus or lung: Secondary | ICD-10-CM | POA: Diagnosis not present

## 2017-08-02 DIAGNOSIS — C3491 Malignant neoplasm of unspecified part of right bronchus or lung: Secondary | ICD-10-CM | POA: Diagnosis not present

## 2017-08-02 DIAGNOSIS — R918 Other nonspecific abnormal finding of lung field: Secondary | ICD-10-CM | POA: Insufficient documentation

## 2017-08-02 DIAGNOSIS — Z9889 Other specified postprocedural states: Secondary | ICD-10-CM

## 2017-08-02 MED ORDER — LIDOCAINE HCL 2 % IJ SOLN
INTRAMUSCULAR | Status: AC
  Filled 2017-08-02: qty 10

## 2017-08-02 NOTE — Progress Notes (Signed)
Pt s/p rt thoracentesis today yielding 1.6 L fluid.  Subsequent chest x-ray revealed suspicion of moderate size loculated hydropneumothorax.  Patient currently stable and denies acute chest pain, worsening dyspnea or cough.  Above findings discussed with patient by Dr. Anselm Pancoast.  She will return to radiology on 11/13 for follow-up chest x-ray.  She was told to return to ED should she have increasing chest pain, worsening dyspnea .

## 2017-08-02 NOTE — Procedures (Signed)
Ultrasound-guided  therapeutic right thoracentesis performed yielding 1.6 liters of yellow fluid. No immediate complications. Follow-up chest x-ray pending.

## 2017-08-03 ENCOUNTER — Other Ambulatory Visit: Payer: Self-pay | Admitting: Radiology

## 2017-08-03 ENCOUNTER — Ambulatory Visit (HOSPITAL_COMMUNITY)
Admission: RE | Admit: 2017-08-03 | Discharge: 2017-08-03 | Disposition: A | Discharge status: Home / Self Care | Payer: Medicare Other | Source: Ambulatory Visit | Attending: Radiology | Admitting: Radiology

## 2017-08-03 DIAGNOSIS — J948 Other specified pleural conditions: Secondary | ICD-10-CM | POA: Diagnosis not present

## 2017-08-03 DIAGNOSIS — Z9889 Other specified postprocedural states: Secondary | ICD-10-CM

## 2017-08-04 ENCOUNTER — Telehealth: Payer: Self-pay | Admitting: Oncology

## 2017-08-04 NOTE — Telephone Encounter (Signed)
Scheduled appt per 11/14 sch msg. Left voicemail for patient regarding the changes in her schedule.

## 2017-08-05 ENCOUNTER — Ambulatory Visit: Payer: Medicare Other | Admitting: Oncology

## 2017-08-05 ENCOUNTER — Telehealth: Payer: Self-pay | Admitting: Oncology

## 2017-08-05 ENCOUNTER — Other Ambulatory Visit: Payer: Medicare Other

## 2017-08-05 NOTE — Telephone Encounter (Signed)
Spoke with patient and rescheduled appt per 11/15 sch msg.

## 2017-08-06 NOTE — Telephone Encounter (Signed)
error 

## 2017-08-13 ENCOUNTER — Other Ambulatory Visit: Payer: Medicare Other

## 2017-08-13 ENCOUNTER — Encounter: Payer: Self-pay | Admitting: *Deleted

## 2017-08-13 ENCOUNTER — Ambulatory Visit: Payer: Medicare Other

## 2017-08-13 ENCOUNTER — Other Ambulatory Visit: Payer: Self-pay | Admitting: Internal Medicine

## 2017-08-13 ENCOUNTER — Encounter: Payer: Self-pay | Admitting: Oncology

## 2017-08-13 ENCOUNTER — Ambulatory Visit (HOSPITAL_BASED_OUTPATIENT_CLINIC_OR_DEPARTMENT_OTHER): Payer: Medicare Other | Admitting: Oncology

## 2017-08-13 ENCOUNTER — Ambulatory Visit: Payer: Medicare Other | Admitting: Oncology

## 2017-08-13 ENCOUNTER — Other Ambulatory Visit (HOSPITAL_BASED_OUTPATIENT_CLINIC_OR_DEPARTMENT_OTHER): Payer: Medicare Other

## 2017-08-13 ENCOUNTER — Telehealth: Payer: Self-pay

## 2017-08-13 VITALS — BP 110/79 | HR 117 | Temp 96.9°F | Resp 18 | Ht 68.0 in | Wt 202.4 lb

## 2017-08-13 DIAGNOSIS — Z79899 Other long term (current) drug therapy: Secondary | ICD-10-CM | POA: Diagnosis not present

## 2017-08-13 DIAGNOSIS — C782 Secondary malignant neoplasm of pleura: Secondary | ICD-10-CM

## 2017-08-13 DIAGNOSIS — C3491 Malignant neoplasm of unspecified part of right bronchus or lung: Secondary | ICD-10-CM

## 2017-08-13 DIAGNOSIS — C342 Malignant neoplasm of middle lobe, bronchus or lung: Secondary | ICD-10-CM

## 2017-08-13 DIAGNOSIS — C3481 Malignant neoplasm of overlapping sites of right bronchus and lung: Secondary | ICD-10-CM

## 2017-08-13 DIAGNOSIS — E860 Dehydration: Secondary | ICD-10-CM

## 2017-08-13 DIAGNOSIS — C7989 Secondary malignant neoplasm of other specified sites: Secondary | ICD-10-CM

## 2017-08-13 DIAGNOSIS — R5382 Chronic fatigue, unspecified: Secondary | ICD-10-CM

## 2017-08-13 LAB — CBC WITH DIFFERENTIAL/PLATELET
BASO%: 0.6 % (ref 0.0–2.0)
Basophils Absolute: 0 10*3/uL (ref 0.0–0.1)
EOS ABS: 0 10*3/uL (ref 0.0–0.5)
EOS%: 0.4 % (ref 0.0–7.0)
HCT: 31.5 % — ABNORMAL LOW (ref 34.8–46.6)
HGB: 10.3 g/dL — ABNORMAL LOW (ref 11.6–15.9)
LYMPH%: 8.4 % — AB (ref 14.0–49.7)
MCH: 30.2 pg (ref 25.1–34.0)
MCHC: 32.9 g/dL (ref 31.5–36.0)
MCV: 91.8 fL (ref 79.5–101.0)
MONO#: 0.6 10*3/uL (ref 0.1–0.9)
MONO%: 9.3 % (ref 0.0–14.0)
NEUT#: 5.4 10*3/uL (ref 1.5–6.5)
NEUT%: 81.3 % — AB (ref 38.4–76.8)
Platelets: 336 10*3/uL (ref 145–400)
RBC: 3.43 10*6/uL — AB (ref 3.70–5.45)
RDW: 18.4 % — ABNORMAL HIGH (ref 11.2–14.5)
WBC: 6.6 10*3/uL (ref 3.9–10.3)
lymph#: 0.6 10*3/uL — ABNORMAL LOW (ref 0.9–3.3)

## 2017-08-13 LAB — COMPREHENSIVE METABOLIC PANEL
ALT: 12 U/L (ref 0–55)
AST: 39 U/L — ABNORMAL HIGH (ref 5–34)
Albumin: 2.9 g/dL — ABNORMAL LOW (ref 3.5–5.0)
Alkaline Phosphatase: 69 U/L (ref 40–150)
Anion Gap: 9 mEq/L (ref 3–11)
BUN: 5.9 mg/dL — ABNORMAL LOW (ref 7.0–26.0)
CO2: 26 mEq/L (ref 22–29)
Calcium: 9.2 mg/dL (ref 8.4–10.4)
Chloride: 106 mEq/L (ref 98–109)
Creatinine: 0.7 mg/dL (ref 0.6–1.1)
EGFR: >60 mL/min/{1.73_m2} (ref >60)
Glucose: 109 mg/dl (ref 70–140)
Potassium: 3.5 mEq/L (ref 3.5–5.1)
Sodium: 140 mEq/L (ref 136–145)
Total Bilirubin: 0.56 mg/dL (ref 0.20–1.20)
Total Protein: 7.3 g/dL (ref 6.4–8.3)

## 2017-08-13 LAB — TSH: TSH: 2.519 m[IU]/L (ref 0.308–3.960)

## 2017-08-13 MED ORDER — ONDANSETRON 8 MG PO TBDP: 8.0000 mg | ORAL_TABLET | Freq: Three times a day (TID) | ORAL | 1 refills | Status: DC | PRN

## 2017-08-13 MED ORDER — SODIUM CHLORIDE 0.9% FLUSH
10.0000 mL | INTRAVENOUS | Status: DC | PRN
  Administered 2017-08-13: 10 mL
  Filled 2017-08-13: qty 10

## 2017-08-13 MED ORDER — PROCHLORPERAZINE MALEATE 10 MG PO TABS: 10.0000 mg | ORAL_TABLET | Freq: Four times a day (QID) | ORAL | 1 refills | Status: DC | PRN

## 2017-08-13 MED ORDER — HEPARIN SOD (PORK) LOCK FLUSH 100 UNIT/ML IV SOLN
500.0000 [IU] | Freq: Once | INTRAVENOUS | Status: AC | PRN
  Administered 2017-08-13: 500 [IU]
  Filled 2017-08-13: qty 5

## 2017-08-13 NOTE — Patient Instructions (Signed)

## 2017-08-13 NOTE — Assessment & Plan Note (Signed)
This is a very pleasant 56 year old African-American female with metastatic non-small cell lung cancer that was initially diagnosed as stage IIIa non-small cell lung cancer, squamous cell carcinoma presented with large right hilar mass and mediastinal lymphadenopathy diagnosed in November 2016. She status post concurrent chemoradiation followed by consolidation chemotherapy. The patient was found recently to have intramuscular metastases and the right posterior chest wall that was biopsy-proven to be squamous cell carcinoma with PDL 1 expression 50%. The patient was a started on treatment with immunotherapy with Keytruda (pembrolizumab) status post 3 cycles which she tolerated well.  This was discontinued due to disease progression.  It was recommended that she enroll in SWOG S1400 clinical trial, but the patient was not eligible for this trial.  She is now here to discuss treatment options.  The patient was seen with Dr. Earlie Server.  Discussed treatment options including palliative care versus systemic chemotherapy with carboplatin and Taxol.  The patient is very much interested in proceeding with systemic therapy.  Recommend that she begin treatment with carboplatin for an AUC of 5 and paclitaxel 175 mg/m given every 3 weeks.  She will receive OnPro with her chemotherapy. Discussed with the patient adverse effects of this treatment including but not limited to alopecia, myelosuppression, nausea and vomiting, peripheral neuropathy, liver or renal dysfunction.  Prescriptions were sent to her pharmacy for Compazine 10 mg every 6 hours as needed for nausea and also Zofran ODT 8 mg every 8 hours as needed for nausea.  We anticipate that she will begin her chemotherapy in the next 1-2 weeks.  The patient will have weekly labs while she is receiving her chemotherapy.  Follow-up visit will be about 1 week after her first cycle of chemotherapy to evaluate side effects.  For pain management the patient will  continue her current pain medication with Percocet.  She discontinued the fentanyl patch because of much better pain control.  She was advised to call immediately if she has any concerning symptoms in the interval. The patient voices understanding of current disease status and treatment options and is in agreement with the current care plan. All questions were answered. The patient knows to call the clinic with any problems, questions or concerns. We can certainly see the patient much sooner if necessary.

## 2017-08-13 NOTE — Progress Notes (Signed)
[facility] OFFICE PROGRESS NOTE  [name], PA-C [place] [address]  DIAGNOSIS: Metastatic non-small cell lung cancer initially diagnosed as Stage IIIA (T2b, N2, M0) non-small cell lung cancer, invasive poorly differentiated squamous cell carcinoma diagnosed in November 2016 and presented with large right hilar mass with collapse of the right middle lobe and extension into the right upper lobe and right lower lobe along the hilum with right paratracheal and subcarinal lymphadenopathy. She presented in July 2018 with metastatic intramuscular mass in the right posterior lateral chest wall, with biopsy confirmed squamous cell carcinoma. PDL1 Expression: 50%.  PRIOR THERAPY: 1) Concurrent chemoradiation with weekly carboplatin for AUC of 2 and paclitaxel 45 MG/M2. First dose 08/19/2015. Status post 5 cycles. Last cycle was given 10/07/2015 with significant response. 2) Consolidation chemotherapy with carboplatin for AUC of 5 and paclitaxel 175 MG/M2 every 3 weeks with Neulasta support. First dose 11/18/2015. Status post 3 cycles. 3) palliative radiotherapy to the intramuscular mass in the right posterior lateral chest wall under the care of Dr. Tammi Klippel. 4) Keytruda 200 mg IV every 3 weeks. First dose 05/12/2017. Status post 3 cycles.  This was discontinued secondary to disease progression.  CURRENT THERAPY: Carboplatin for an AUC of 5 and paclitaxel 175 MG/m2 every 3 weeks with OnPro support. First dose expected 08/24/2017.  INTERVAL HISTORY: Theresa [name] 56 y.o. female returns for a routine follow-up with her daughter.  The patient is feeling fine today with no specific complaints.  She recently went on a cruise with her family members and was able to remain active.  The patient denies fevers and chills.  Denies chest pain, shortness of breath, cough, hemoptysis.  Denies nausea, vomiting, constipation, diarrhea.  She has not had any weight loss or night sweats.   Pain is controlled using oxycodone 1-2 tablets every 6 hours as needed for pain.  The patient is here to discuss treatment options for her lung cancer.  MEDICAL HISTORY: Past Medical History:  Diagnosis Date  . Cancer ([facility])    5 years ago - cervical cancer  . Encounter for antineoplastic chemotherapy 08/27/2015  . History of radiation therapy 08/19/2015 - 10/07/2015   Site/dose:   The patient's primary tumor and involved lymph nodes were treated to 66 Gy in 30 fractions.  . Hypokalemia 09/30/2015  . Non-small cell carcinoma of lung, stage 3 ([facility]) 08/08/2015  . Paroxysmal atrial flutter ([facility]) 09/16/2015  . Pneumonia     ALLERGIES:  has No Known Allergies.  MEDICATIONS:  Current Outpatient Medications  Medication Sig Dispense Refill  . docusate sodium (COLACE) 100 MG capsule Take 1 capsule (100 mg total) by mouth 2 (two) times daily. (Patient not taking: Reported on 07/29/2017) 60 capsule 1  . fentaNYL (DURAGESIC - DOSED MCG/HR) 50 MCG/HR Place 1 patch (50 mcg total) onto the skin every 3 (three) days. (Patient not taking: Reported on 07/08/2017) 10 patch 0  . lactulose (CHRONULAC) 10 GM/15ML solution Take 15 mLs (10 g total) by mouth 3 (three) times daily as needed for mild constipation. (Patient not taking: Reported on 07/29/2017) 240 mL 3  . lidocaine-prilocaine (EMLA) cream Apply 1 application topically as needed. 30 g 2  . linaclotide (LINZESS) 145 MCG CAPS capsule Take 145 mcg by mouth daily before breakfast.    . magnesium hydroxide (MILK OF MAGNESIA) 400 MG/5ML suspension Take 5 mLs by mouth at bedtime as needed for mild constipation.    . Multiple Vitamin (MULTIVITAMIN) capsule Take 1 capsule daily  by mouth. 30 capsule 2  . ondansetron (ZOFRAN-ODT) 8 MG disintegrating tablet Take 1 tablet (8 mg total) by mouth every 8 (eight) hours as needed for nausea or vomiting. 20 tablet 1  . oxyCODONE (OXY IR/ROXICODONE) 5 MG immediate release tablet Take 1-2 tablets (5-10 mg total) every 6 (six)  hours as needed by mouth for severe pain. 60 tablet 0  . prochlorperazine (COMPAZINE) 10 MG tablet Take 1 tablet (10 mg total) by mouth every 6 (six) hours as needed for nausea or vomiting. 30 tablet 1   No current facility-administered medications for this visit.    Facility-Administered Medications Ordered in Other Visits  Medication Dose Route Frequency Provider Last Rate Last Dose  . 0.9 %  sodium chloride infusion   Intravenous Continuous Deonte Otting R, NP      . 0.9 %  sodium chloride infusion   Intravenous Continuous Curt Bears, MD   Stopped at 05/31/17 1714  . ondansetron (ZOFRAN) injection 8 mg  8 mg Intravenous Once Curt Bears, MD        SURGICAL HISTORY:  Past Surgical History:  Procedure Laterality Date  . IR FLUORO GUIDE PORT INSERTION RIGHT  05/21/2017  . IR US GUIDE VASC ACCESS RIGHT  05/21/2017  . TUBAL LIGATION    . VIDEO BRONCHOSCOPY WITH ENDOBRONCHIAL ULTRASOUND N/A 08/02/2015   Procedure: VIDEO BRONCHOSCOPY WITH ENDOBRONCHIAL ULTRASOUND;  Surgeon: [name], MD;  Location: [facility];  Service: Thoracic;  Laterality: N/A;  . WISDOM TOOTH EXTRACTION      REVIEW OF SYSTEMS:   Review of Systems  Constitutional: Negative for appetite change, chills, fatigue, fever and unexpected weight change.  HENT:   Negative for mouth sores, nosebleeds, sore throat and trouble swallowing.   Eyes: Negative for eye problems and icterus.  Respiratory: Negative for cough, hemoptysis, shortness of breath and wheezing.   Cardiovascular: Negative for chest pain and leg swelling.  Gastrointestinal: Negative for abdominal pain, constipation, diarrhea, nausea and vomiting.  Genitourinary: Negative for bladder incontinence, difficulty urinating, dysuria, frequency and hematuria.   Musculoskeletal: Negative for back pain, gait problem, neck pain and neck stiffness.  Skin: Negative for itching and rash.  Neurological: Negative for dizziness, extremity weakness, gait  problem, headaches, light-headedness and seizures.  Hematological: Negative for adenopathy. Does not bruise/bleed easily.  Psychiatric/Behavioral: Negative for confusion, depression and sleep disturbance. The patient is not nervous/anxious.     PHYSICAL EXAMINATION:  Blood pressure 110/79, pulse (!) 117, temperature (!) 96.9 F (36.1 C), temperature source Oral, resp. rate 18, height '5\' 8"'  (1.727 m), weight 202 lb 6.4 oz (91.8 kg), SpO2 98 %.  ECOG PERFORMANCE STATUS: 1 - Symptomatic but completely ambulatory  Physical Exam  Constitutional: Oriented to person, place, and time and well-developed, well-nourished, and in no distress. No distress.  HENT:  Head: Normocephalic and atraumatic.  Mouth/Throat: Oropharynx is clear and moist. No oropharyngeal exudate.  Eyes: Conjunctivae are normal. Right eye exhibits no discharge. Left eye exhibits no discharge. No scleral icterus.  Neck: Normal range of motion. Neck supple.  Cardiovascular: Normal rate, regular rhythm, normal heart sounds and intact distal pulses.   Pulmonary/Chest: Effort normal and breath sounds normal. No respiratory distress. No wheezes. No rales.  Abdominal: Soft. Bowel sounds are normal. Exhibits no distension and no mass. There is no tenderness.  Musculoskeletal: Normal range of motion. Exhibits no edema.  Lymphadenopathy:    No cervical adenopathy.  Neurological: Alert and oriented to person, place, and time. Exhibits normal muscle tone. Gait  normal. Coordination normal.  Skin: Skin is warm and dry. No rash noted. Not diaphoretic. No erythema. No pallor.  Psychiatric: Mood, memory and judgment normal.  Examination of her back shows a palpable mass above the right scapula. Vitals reviewed.  LABORATORY DATA: Lab Results  Component Value Date   WBC 6.6 08/13/2017   HGB 10.3 (L) 08/13/2017   HCT 31.5 (L) 08/13/2017   MCV 91.8 08/13/2017   PLT 336 08/13/2017      Chemistry      Component Value Date/Time   NA 140  08/13/2017 0926   K 3.5 08/13/2017 0926   CL 99 (L) 04/19/2017 1207   CO2 26 08/13/2017 0926   BUN 5.9 (L) 08/13/2017 0926   CREATININE 0.7 08/13/2017 0926      Component Value Date/Time   CALCIUM 9.2 08/13/2017 0926   ALKPHOS 69 08/13/2017 0926   AST 39 (H) 08/13/2017 0926   ALT 12 08/13/2017 0926   BILITOT 0.56 08/13/2017 0926       RADIOGRAPHIC STUDIES:  Dg Chest 1 View  Result Date: 08/02/2017 CLINICAL DATA:  56 year old female status post ultrasound-guided right side thoracentesis. Metastatic right side lung cancer. EXAM: CHEST 1 VIEW COMPARISON:  Chest CT 07/27/2017.  Chest radiographs 03/11/2017. FINDINGS: Upright PA view of the chest at 1503 hours. Significantly decreased right pleural effusions size since the recent CT with small to moderate-sized residual dependent right pleural fluid. Conspicuous linear boundary in the aerated right lung and although there are lung markings peripheral to this boundary, the appearance is suspicious for loculated pneumothorax due to impaired re-expansion of the right lung. Associated residual confluent perihilar/infrahilar opacity. Stable right chest porta cath. Stable cardiac size and mediastinal contours. The left lung appears clear. Negative visible bowel gas pattern. No acute osseous abnormality identified. IMPRESSION: 1. Suspicion of moderate-size loculated right hydropneumothorax following right side thoracentesis due to impaired re-expansion of the right lung. Recommend confirmation with Chest CT prior to any chest tube intervention. 2. Stable mediastinal contour.  Stable left lung. 3. Study discussed by telephone with IR PA Shawna Orleans on 08/02/2017 at 15:24 . Electronically Signed   By: Genevie Ann M.D.   On: 08/02/2017 15:25   Dg Chest 2 View  Result Date: 08/03/2017 CLINICAL DATA:  Followup right thoracentesis. EXAM: CHEST  2 VIEW COMPARISON:  08/02/2017 FINDINGS: Hydropneumothorax on the right persists. No mediastinal shift. Slight  re-accumulation of pleural fluid on the right. Persistent pleural air on the right appears slightly increased, but I think this is more likely secondary to the fact that the pleural fluid is reaccumulating rather than persistent air leak. Left chest remains clear. IMPRESSION: Persistent hydropneumothorax on the right. Slight re-accumulation of pleural fluid on the right. Amount of pleural air appear slightly increased, but this may be secondary to the fact that there is increasing pleural fluid rather than ongoing air leak. Electronically Signed   By: Nelson Chimes M.D.   On: 08/03/2017 14:05   Ct Chest W Contrast  Result Date: 07/27/2017 CLINICAL DATA:  Metastatic right-sided lung cancer. Encounter for immunotherapy. Non-small-cell. Staging. Right scapular soft tissue lump/ mass for 2-3 months. EXAM: CT CHEST, ABDOMEN, AND PELVIS WITH CONTRAST TECHNIQUE: Multidetector CT imaging of the chest, abdomen and pelvis was performed following the standard protocol during bolus administration of intravenous contrast. CONTRAST:  110m ISOVUE-300 IOPAMIDOL (ISOVUE-300) INJECTION 61% COMPARISON:  05/05/2017 PET. 04/19/2017 chest CT. Most recent abdominopelvic CT of 03/11/2017. FINDINGS: CT CHEST FINDINGS Cardiovascular: A right-sided Port-A-Cath which terminates at  the high right atrium. Normal heart size, without pericardial effusion. No central pulmonary embolism, on this non-dedicated study. Abberant right subclavian artery, traversing posterior to the esophagus. Mediastinum/Nodes: No supraclavicular adenopathy. No mediastinal or definite hilar adenopathy Right internal mammary node versus anterior pleural implant. This is new, including at 1.5 cm on image 32/series 2. Lungs/Pleura: Since the prior CT, development of moderate right-sided pleural effusion. Pleural nodularity persists, with example area in the upper right hemithorax measuring 10 mm on image 75/series 2. Increased compression of right-sided endobronchial  tree. Worsened right-sided aeration. Increased right perihilar consolidation and traction bronchiectasis are likely radiation induced. Persistent right lower lobe volume loss and atelectasis. Anterior right lung base nodule measures 6 mm on image 84/series 6 and is felt to be new. Posterior right apical soft tissue density is favored to represent pleural-based mass mass over atelectasis. This is new, including at 5.0 by 3.5 cm on image 8/series 2. Musculoskeletal: Right chest wall mass with increased central necrosis. Measures on the order of 10.7 x 5.6 cm on image 35/series 2. Compare 8.9 x 5.3 cm on the prior exam. Direct extension versus adjacent adenopathy within the posterior inferior axilla, new at 1.8 cm on image 26/series 2. The right chest wall mass is contiguous with direct tumor extending from the right pleural space, as before. Example image 38/series 2. CT ABDOMEN PELVIS FINDINGS Hepatobiliary: High left hepatic lobe low-density lesions are likely cysts. Other lesions are too small to characterize but similar. Normal gallbladder, without biliary ductal dilatation. Pancreas: Normal, without mass or ductal dilatation. Spleen: Normal in size, without focal abnormality. Adrenals/Urinary Tract: Normal adrenal glands. Normal kidneys, without hydronephrosis. Decompressed urinary bladder. Stomach/Bowel: Proximal gastric underdistention. The cecum extends into the central pelvis. Normal terminal ileum and appendix. Normal small bowel. Vascular/Lymphatic: Aortic and branch vessel atherosclerosis. No abdominopelvic adenopathy. Reproductive: Normal uterus. Right ovarian dermoid at 4.1 cm is grossly similar. Likely a smaller adjacent 1.7 cm right-sided dermoid including on image 107/series 2. Alternatively, this could represent 1 bilobed lesion. Other: No significant free fluid. No evidence of omental or peritoneal disease. Musculoskeletal: No acute osseous abnormality. IMPRESSION: 1. Since the prior diagnostic CT  of 04/19/2017, disease progression within the chest. Increase in dominant right chest wall mass and right pleural based disease. Development of moderate right pleural effusion with worsened right-sided aeration. Suspect a a new anterior right lung base pulmonary nodule/metastasis. 2. No subdiaphragmatic metastatic disease identified. 3. Right ovarian dermoid or dermoids, similar. 4.  Aortic Atherosclerosis (ICD10-I70.0). Electronically Signed   By: Abigail Miyamoto M.D.   On: 07/27/2017 15:26   Dg Chest Left Decubitus  Result Date: 08/02/2017 CLINICAL DATA:  Post thoracentesis EXAM: CHEST - LEFT DECUBITUS COMPARISON:  08/02/2017, CT 07/27/2017, radiograph 03/11/2017 FINDINGS: Left side down decubitus view of the chest. Right-sided central venous port tip over the distal SVC. Residual pleural collection on the right. Similar appearance of possible pleural line laterally and apically, note is made of appearance of scarring on 03/11/2017 in the right hilar area and lower lung. Consolidation at the right base. IMPRESSION: Similar appearance of possible pleural line at the right apex as may be seen with a hydropneumothorax ; CT would be confirmatory. Residual right likely loculated pleural effusion at the right base. Electronically Signed   By: Donavan Foil M.D.   On: 08/02/2017 16:05   Ct Abdomen Pelvis W Contrast  Result Date: 07/27/2017 CLINICAL DATA:  Metastatic right-sided lung cancer. Encounter for immunotherapy. Non-small-cell. Staging. Right scapular soft tissue lump/ mass  for 2-3 months. EXAM: CT CHEST, ABDOMEN, AND PELVIS WITH CONTRAST TECHNIQUE: Multidetector CT imaging of the chest, abdomen and pelvis was performed following the standard protocol during bolus administration of intravenous contrast. CONTRAST:  128m ISOVUE-300 IOPAMIDOL (ISOVUE-300) INJECTION 61% COMPARISON:  05/05/2017 PET. 04/19/2017 chest CT. Most recent abdominopelvic CT of 03/11/2017. FINDINGS: CT CHEST FINDINGS Cardiovascular: A  right-sided Port-A-Cath which terminates at the high right atrium. Normal heart size, without pericardial effusion. No central pulmonary embolism, on this non-dedicated study. Abberant right subclavian artery, traversing posterior to the esophagus. Mediastinum/Nodes: No supraclavicular adenopathy. No mediastinal or definite hilar adenopathy Right internal mammary node versus anterior pleural implant. This is new, including at 1.5 cm on image 32/series 2. Lungs/Pleura: Since the prior CT, development of moderate right-sided pleural effusion. Pleural nodularity persists, with example area in the upper right hemithorax measuring 10 mm on image 75/series 2. Increased compression of right-sided endobronchial tree. Worsened right-sided aeration. Increased right perihilar consolidation and traction bronchiectasis are likely radiation induced. Persistent right lower lobe volume loss and atelectasis. Anterior right lung base nodule measures 6 mm on image 84/series 6 and is felt to be new. Posterior right apical soft tissue density is favored to represent pleural-based mass mass over atelectasis. This is new, including at 5.0 by 3.5 cm on image 8/series 2. Musculoskeletal: Right chest wall mass with increased central necrosis. Measures on the order of 10.7 x 5.6 cm on image 35/series 2. Compare 8.9 x 5.3 cm on the prior exam. Direct extension versus adjacent adenopathy within the posterior inferior axilla, new at 1.8 cm on image 26/series 2. The right chest wall mass is contiguous with direct tumor extending from the right pleural space, as before. Example image 38/series 2. CT ABDOMEN PELVIS FINDINGS Hepatobiliary: High left hepatic lobe low-density lesions are likely cysts. Other lesions are too small to characterize but similar. Normal gallbladder, without biliary ductal dilatation. Pancreas: Normal, without mass or ductal dilatation. Spleen: Normal in size, without focal abnormality. Adrenals/Urinary Tract: Normal adrenal  glands. Normal kidneys, without hydronephrosis. Decompressed urinary bladder. Stomach/Bowel: Proximal gastric underdistention. The cecum extends into the central pelvis. Normal terminal ileum and appendix. Normal small bowel. Vascular/Lymphatic: Aortic and branch vessel atherosclerosis. No abdominopelvic adenopathy. Reproductive: Normal uterus. Right ovarian dermoid at 4.1 cm is grossly similar. Likely a smaller adjacent 1.7 cm right-sided dermoid including on image 107/series 2. Alternatively, this could represent 1 bilobed lesion. Other: No significant free fluid. No evidence of omental or peritoneal disease. Musculoskeletal: No acute osseous abnormality. IMPRESSION: 1. Since the prior diagnostic CT of 04/19/2017, disease progression within the chest. Increase in dominant right chest wall mass and right pleural based disease. Development of moderate right pleural effusion with worsened right-sided aeration. Suspect a a new anterior right lung base pulmonary nodule/metastasis. 2. No subdiaphragmatic metastatic disease identified. 3. Right ovarian dermoid or dermoids, similar. 4.  Aortic Atherosclerosis (ICD10-I70.0). Electronically Signed   By: KAbigail MiyamotoM.D.   On: 07/27/2017 15:26   [place]oracentesis Asp Pleural Space W/img Guide  Result Date: 08/02/2017 INDICATION: Patient with history of right lung cancer, right pleural effusion. Request made for therapeutic right thoracentesis. EXAM: ULTRASOUND GUIDED THERAPEUTIC RIGHT THORACENTESIS MEDICATIONS: None. COMPLICATIONS: None immediate. PROCEDURE: An ultrasound guided thoracentesis was thoroughly discussed with the patient and questions answered. The benefits, risks, alternatives and complications were also discussed. The patient understands and wishes to proceed with the procedure. Written consent was obtained. Ultrasound was performed to localize and mark an adequate pocket of fluid in the right  chest. The area was then prepped and draped in the normal  sterile fashion. 1% Lidocaine was used for local anesthesia. Under ultrasound guidance a Safe-T-Centesis catheter was introduced. Thoracentesis was performed. The catheter was removed and a dressing applied. FINDINGS: A total of approximately 1.6 liters of yellow fluid was removed. IMPRESSION: Successful ultrasound guided therapeutic right thoracentesis yielding 1.6 liters of pleural fluid. Postprocedure chest x-ray revealed suspicion of loculated hydropneumothorax/impaired re-expansion. Patient currently stable and without chest pain, dyspnea or cough. She will return to radiology department on 08/03/2017 for follow-up chest x-ray. She was told to return to the ED should she experience increasing chest pain or worsening dyspnea. Read by: Rowe Robert, PA-C Electronically Signed   By: Markus Daft M.D.   On: 08/02/2017 16:31     ASSESSMENT/PLAN:  Metastatic primary lung cancer, right [facility]) This is a very pleasant 56 year old African-American female with metastatic non-small cell lung cancer that was initially diagnosed as stage IIIa non-small cell lung cancer, squamous cell carcinoma presented with large right hilar mass and mediastinal lymphadenopathy diagnosed in November 2016. She status post concurrent chemoradiation followed by consolidation chemotherapy. The patient was found recently to have intramuscular metastases and the right posterior chest wall that was biopsy-proven to be squamous cell carcinoma with PDL 1 expression 50%. The patient was a started on treatment with immunotherapy with Keytruda (pembrolizumab) status post 3 cycles which she tolerated well.  This was discontinued due to disease progression.  It was recommended that she enroll in SWOG S1400 clinical trial, but the patient was not eligible for this trial.  She is now here to discuss treatment options.  The patient was seen with Dr. Earlie Server.  Discussed treatment options including palliative care versus systemic chemotherapy with  carboplatin and Taxol.  The patient is very much interested in proceeding with systemic therapy.  Recommend that she begin treatment with carboplatin for an AUC of 5 and paclitaxel 175 mg/m given every 3 weeks.  She will receive OnPro with her chemotherapy. Discussed with the patient adverse effects of this treatment including but not limited to alopecia, myelosuppression, nausea and vomiting, peripheral neuropathy, liver or renal dysfunction.  Prescriptions were sent to her pharmacy for Compazine 10 mg every 6 hours as needed for nausea and also Zofran ODT 8 mg every 8 hours as needed for nausea.  We anticipate that she will begin her chemotherapy in the next 1-2 weeks.  The patient will have weekly labs while she is receiving her chemotherapy.  Follow-up visit will be about 1 week after her first cycle of chemotherapy to evaluate side effects.  For pain management the patient will continue her current pain medication with Percocet.  She discontinued the fentanyl patch because of much better pain control.  She was advised to call immediately if she has any concerning symptoms in the interval. The patient voices understanding of current disease status and treatment options and is in agreement with the current care plan. All questions were answered. The patient knows to call the clinic with any problems, questions or concerns. We can certainly see the patient much sooner if necessary.  Orders Placed This Encounter  Procedures  . CBC with Differential/Platelet    Standing Status:   Standing    Number of Occurrences:   20    Standing Expiration Date:   08/13/2018  . Comprehensive metabolic panel    Standing Status:   Standing    Number of Occurrences:   20    Standing  Expiration Date:   08/13/2018    Mikey Bussing, DNP, AGPCNP-BC, AOCNP 08/13/17  ADDENDUM: Hematology/Oncology Attending: I had a face-to-face encounter with the patient today.  I recommended her care plan.  This is a very  pleasant 56 years old African-American female with metastatic non-small cell lung cancer, squamous cell carcinoma and PDL 1 expression of 50% recently treated with 3 cycles of immunotherapy with Keytruda.  She tolerated the treatment well but unfortunately her imaging studies showed evidence for disease progression. The patient was also referred for a clinical trial with the SWOG S 1400 but unfortunately she was not eligible for the treatment because of the rapid progression on immunotherapy. I had a lengthy discussion with the patient today about her condition and other treatment options.  I gave the patient the option of palliative care versus consideration of systemic chemotherapy with carboplatin for [facility] of 5 and paclitaxel 175 mg/M2 every 3 weeks with Neulasta support.  I discussed with the patient the adverse effects of this treatment including but not limited to alopecia, myelosuppression, nausea and vomiting, peripheral neuropathy, liver or renal dysfunction. The patient would like to proceed with systemic chemotherapy and she is expected to start the first dose of this treatment on August 24, 2017. The patient was advised to call immediately if she has any concerning symptoms in the interval.  Disclaimer: This note was dictated with voice recognition software. Similar sounding words can inadvertently be transcribed and may be missed upon review. Eilleen Kempf, MD 08/13/17

## 2017-08-13 NOTE — Patient Instructions (Addendum)
Carboplatin injection What is this medicine? CARBOPLATIN (KAR boe pla tin) is a chemotherapy drug. It targets fast dividing cells, like cancer cells, and causes these cells to die. This medicine is used to treat ovarian cancer and many other cancers. This medicine may be used for other purposes; ask your health care provider or pharmacist if you have questions. COMMON BRAND NAME(S): Paraplatin What should I tell my health care provider before I take this medicine? They need to know if you have any of these conditions: -blood disorders -hearing problems -kidney disease -recent or ongoing radiation therapy -an unusual or allergic reaction to carboplatin, cisplatin, other chemotherapy, other medicines, foods, dyes, or preservatives -pregnant or trying to get pregnant -breast-feeding How should I use this medicine? This drug is usually given as an infusion into a vein. It is administered in a hospital or clinic by a specially trained health care professional. Talk to your pediatrician regarding the use of this medicine in children. Special care may be needed. Overdosage: If you think you have taken too much of this medicine contact a poison control center or emergency room at once. NOTE: This medicine is only for you. Do not share this medicine with others. What if I miss a dose? It is important not to miss a dose. Call your doctor or health care professional if you are unable to keep an appointment. What may interact with this medicine? -medicines for seizures -medicines to increase blood counts like filgrastim, pegfilgrastim, sargramostim -some antibiotics like amikacin, gentamicin, neomycin, streptomycin, tobramycin -vaccines Talk to your doctor or health care professional before taking any of these medicines: -acetaminophen -aspirin -ibuprofen -ketoprofen -naproxen This list may not describe all possible interactions. Give your health care provider a list of all the medicines, herbs,  non-prescription drugs, or dietary supplements you use. Also tell them if you smoke, drink alcohol, or use illegal drugs. Some items may interact with your medicine. What should I watch for while using this medicine? Your condition will be monitored carefully while you are receiving this medicine. You will need important blood work done while you are taking this medicine. This drug may make you feel generally unwell. This is not uncommon, as chemotherapy can affect healthy cells as well as cancer cells. Report any side effects. Continue your course of treatment even though you feel ill unless your doctor tells you to stop. In some cases, you may be given additional medicines to help with side effects. Follow all directions for their use. Call your doctor or health care professional for advice if you get a fever, chills or sore throat, or other symptoms of a cold or flu. Do not treat yourself. This drug decreases your body's ability to fight infections. Try to avoid being around people who are sick. This medicine may increase your risk to bruise or bleed. Call your doctor or health care professional if you notice any unusual bleeding. Be careful brushing and flossing your teeth or using a toothpick because you may get an infection or bleed more easily. If you have any dental work done, tell your dentist you are receiving this medicine. Avoid taking products that contain aspirin, acetaminophen, ibuprofen, naproxen, or ketoprofen unless instructed by your doctor. These medicines may hide a fever. Do not become pregnant while taking this medicine. Women should inform their doctor if they wish to become pregnant or think they might be pregnant. There is a potential for serious side effects to an unborn child. Talk to your health care professional or  pharmacist for more information. Do not breast-feed an infant while taking this medicine. What side effects may I notice from receiving this medicine? Side effects  that you should report to your doctor or health care professional as soon as possible: -allergic reactions like skin rash, itching or hives, swelling of the face, lips, or tongue -signs of infection - fever or chills, cough, sore throat, pain or difficulty passing urine -signs of decreased platelets or bleeding - bruising, pinpoint red spots on the skin, black, tarry stools, nosebleeds -signs of decreased red blood cells - unusually weak or tired, fainting spells, lightheadedness -breathing problems -changes in hearing -changes in vision -chest pain -high blood pressure -low blood counts - This drug may decrease the number of white blood cells, red blood cells and platelets. You may be at increased risk for infections and bleeding. -nausea and vomiting -pain, swelling, redness or irritation at the injection site -pain, tingling, numbness in the hands or feet -problems with balance, talking, walking -trouble passing urine or change in the amount of urine Side effects that usually do not require medical attention (report to your doctor or health care professional if they continue or are bothersome): -hair loss -loss of appetite -metallic taste in the mouth or changes in taste This list may not describe all possible side effects. Call your doctor for medical advice about side effects. You may report side effects to FDA at 1-800-FDA-1088. Where should I keep my medicine? This drug is given in a hospital or clinic and will not be stored at home. NOTE: This sheet is a summary. It may not cover all possible information. If you have questions about this medicine, talk to your doctor, pharmacist, or health care provider.  2018 Elsevier/Gold Standard (2007-12-13 14:38:05)  Paclitaxel injection What is this medicine? PACLITAXEL (PAK li TAX el) is a chemotherapy drug. It targets fast dividing cells, like cancer cells, and causes these cells to die. This medicine is used to treat ovarian cancer,  breast cancer, and other cancers. This medicine may be used for other purposes; ask your health care provider or pharmacist if you have questions. COMMON BRAND NAME(S): Onxol, Taxol What should I tell my health care provider before I take this medicine? They need to know if you have any of these conditions: -blood disorders -irregular heartbeat -infection (especially a virus infection such as chickenpox, cold sores, or herpes) -liver disease -previous or ongoing radiation therapy -an unusual or allergic reaction to paclitaxel, alcohol, polyoxyethylated castor oil, other chemotherapy agents, other medicines, foods, dyes, or preservatives -pregnant or trying to get pregnant -breast-feeding How should I use this medicine? This drug is given as an infusion into a vein. It is administered in a hospital or clinic by a specially trained health care professional. Talk to your pediatrician regarding the use of this medicine in children. Special care may be needed. Overdosage: If you think you have taken too much of this medicine contact a poison control center or emergency room at once. NOTE: This medicine is only for you. Do not share this medicine with others. What if I miss a dose? It is important not to miss your dose. Call your doctor or health care professional if you are unable to keep an appointment. What may interact with this medicine? Do not take this medicine with any of the following medications: -disulfiram -metronidazole This medicine may also interact with the following medications: -cyclosporine -diazepam -ketoconazole -medicines to increase blood counts like filgrastim, pegfilgrastim, sargramostim -other chemotherapy drugs like  cisplatin, doxorubicin, epirubicin, etoposide, teniposide, vincristine -quinidine -testosterone -vaccines -verapamil Talk to your doctor or health care professional before taking any of these  medicines: -acetaminophen -aspirin -ibuprofen -ketoprofen -naproxen This list may not describe all possible interactions. Give your health care provider a list of all the medicines, herbs, non-prescription drugs, or dietary supplements you use. Also tell them if you smoke, drink alcohol, or use illegal drugs. Some items may interact with your medicine. What should I watch for while using this medicine? Your condition will be monitored carefully while you are receiving this medicine. You will need important blood work done while you are taking this medicine. This medicine can cause serious allergic reactions. To reduce your risk you will need to take other medicine(s) before treatment with this medicine. If you experience allergic reactions like skin rash, itching or hives, swelling of the face, lips, or tongue, tell your doctor or health care professional right away. In some cases, you may be given additional medicines to help with side effects. Follow all directions for their use. This drug may make you feel generally unwell. This is not uncommon, as chemotherapy can affect healthy cells as well as cancer cells. Report any side effects. Continue your course of treatment even though you feel ill unless your doctor tells you to stop. Call your doctor or health care professional for advice if you get a fever, chills or sore throat, or other symptoms of a cold or flu. Do not treat yourself. This drug decreases your body's ability to fight infections. Try to avoid being around people who are sick. This medicine may increase your risk to bruise or bleed. Call your doctor or health care professional if you notice any unusual bleeding. Be careful brushing and flossing your teeth or using a toothpick because you may get an infection or bleed more easily. If you have any dental work done, tell your dentist you are receiving this medicine. Avoid taking products that contain aspirin, acetaminophen, ibuprofen,  naproxen, or ketoprofen unless instructed by your doctor. These medicines may hide a fever. Do not become pregnant while taking this medicine. Women should inform their doctor if they wish to become pregnant or think they might be pregnant. There is a potential for serious side effects to an unborn child. Talk to your health care professional or pharmacist for more information. Do not breast-feed an infant while taking this medicine. Men are advised not to father a child while receiving this medicine. This product may contain alcohol. Ask your pharmacist or healthcare provider if this medicine contains alcohol. Be sure to tell all healthcare providers you are taking this medicine. Certain medicines, like metronidazole and disulfiram, can cause an unpleasant reaction when taken with alcohol. The reaction includes flushing, headache, nausea, vomiting, sweating, and increased thirst. The reaction can last from 30 minutes to several hours. What side effects may I notice from receiving this medicine? Side effects that you should report to your doctor or health care professional as soon as possible: -allergic reactions like skin rash, itching or hives, swelling of the face, lips, or tongue -low blood counts - This drug may decrease the number of white blood cells, red blood cells and platelets. You may be at increased risk for infections and bleeding. -signs of infection - fever or chills, cough, sore throat, pain or difficulty passing urine -signs of decreased platelets or bleeding - bruising, pinpoint red spots on the skin, black, tarry stools, nosebleeds -signs of decreased red blood cells - unusually weak  or tired, fainting spells, lightheadedness -breathing problems -chest pain -high or low blood pressure -mouth sores -nausea and vomiting -pain, swelling, redness or irritation at the injection site -pain, tingling, numbness in the hands or feet -slow or irregular heartbeat -swelling of the ankle,  feet, hands Side effects that usually do not require medical attention (report to your doctor or health care professional if they continue or are bothersome): -bone pain -complete hair loss including hair on your head, underarms, pubic hair, eyebrows, and eyelashes -changes in the color of fingernails -diarrhea -loosening of the fingernails -loss of appetite -muscle or joint pain -red flush to skin -sweating This list may not describe all possible side effects. Call your doctor for medical advice about side effects. You may report side effects to FDA at 1-800-FDA-1088. Where should I keep my medicine? This drug is given in a hospital or clinic and will not be stored at home. NOTE: This sheet is a summary. It may not cover all possible information. If you have questions about this medicine, talk to your doctor, pharmacist, or health care provider.  2018 Elsevier/Gold Standard (2015-07-09 19:58:00)  Pegfilgrastim injection What is this medicine? PEGFILGRASTIM (PEG fil gra stim) is a long-acting granulocyte colony-stimulating factor that stimulates the growth of neutrophils, a type of white blood cell important in the body's fight against infection. It is used to reduce the incidence of fever and infection in patients with certain types of cancer who are receiving chemotherapy that affects the bone marrow, and to increase survival after being exposed to high doses of radiation. This medicine may be used for other purposes; ask your health care provider or pharmacist if you have questions. COMMON BRAND NAME(S): Neulasta What should I tell my health care provider before I take this medicine? They need to know if you have any of these conditions: -kidney disease -latex allergy -ongoing radiation therapy -sickle cell disease -skin reactions to acrylic adhesives (On-Body Injector only) -an unusual or allergic reaction to pegfilgrastim, filgrastim, other medicines, foods, dyes, or  preservatives -pregnant or trying to get pregnant -breast-feeding How should I use this medicine? This medicine is for injection under the skin. If you get this medicine at home, you will be taught how to prepare and give the pre-filled syringe or how to use the On-body Injector. Refer to the patient Instructions for Use for detailed instructions. Use exactly as directed. Tell your healthcare provider immediately if you suspect that the On-body Injector may not have performed as intended or if you suspect the use of the On-body Injector resulted in a missed or partial dose. It is important that you put your used needles and syringes in a special sharps container. Do not put them in a trash can. If you do not have a sharps container, call your pharmacist or healthcare provider to get one. Talk to your pediatrician regarding the use of this medicine in children. While this drug may be prescribed for selected conditions, precautions do apply. Overdosage: If you think you have taken too much of this medicine contact a poison control center or emergency room at once. NOTE: This medicine is only for you. Do not share this medicine with others. What if I miss a dose? It is important not to miss your dose. Call your doctor or health care professional if you miss your dose. If you miss a dose due to an On-body Injector failure or leakage, a new dose should be administered as soon as possible using a single prefilled syringe  for manual use. What may interact with this medicine? Interactions have not been studied. Give your health care provider a list of all the medicines, herbs, non-prescription drugs, or dietary supplements you use. Also tell them if you smoke, drink alcohol, or use illegal drugs. Some items may interact with your medicine. This list may not describe all possible interactions. Give your health care provider a list of all the medicines, herbs, non-prescription drugs, or dietary supplements you  use. Also tell them if you smoke, drink alcohol, or use illegal drugs. Some items may interact with your medicine. What should I watch for while using this medicine? You may need blood work done while you are taking this medicine. If you are going to need a MRI, CT scan, or other procedure, tell your doctor that you are using this medicine (On-Body Injector only). What side effects may I notice from receiving this medicine? Side effects that you should report to your doctor or health care professional as soon as possible: -allergic reactions like skin rash, itching or hives, swelling of the face, lips, or tongue -dizziness -fever -pain, redness, or irritation at site where injected -pinpoint red spots on the skin -red or dark-brown urine -shortness of breath or breathing problems -stomach or side pain, or pain at the shoulder -swelling -tiredness -trouble passing urine or change in the amount of urine Side effects that usually do not require medical attention (report to your doctor or health care professional if they continue or are bothersome): -bone pain -muscle pain This list may not describe all possible side effects. Call your doctor for medical advice about side effects. You may report side effects to FDA at 1-800-FDA-1088. Where should I keep my medicine? Keep out of the reach of children. Store pre-filled syringes in a refrigerator between 2 and 8 degrees C (36 and 46 degrees F). Do not freeze. Keep in carton to protect from light. Throw away this medicine if it is left out of the refrigerator for more than 48 hours. Throw away any unused medicine after the expiration date. NOTE: This sheet is a summary. It may not cover all possible information. If you have questions about this medicine, talk to your doctor, pharmacist, or health care provider.  2018 Elsevier/Gold Standard (2016-09-03 12:58:03)

## 2017-08-13 NOTE — Progress Notes (Signed)
DISCONTINUE ON PATHWAY REGIMEN - Non-Small Cell Lung     A cycle is 21 days:     Pembrolizumab   **Always confirm dose/schedule in your pharmacy ordering system**    REASON: Disease Progression PRIOR TREATMENT: [id number]: Pembrolizumab 200 mg q21 Days Until Disease Progression, Unacceptable Toxicity, or up to 24 Months TREATMENT RESPONSE: Progressive Disease (PD)  START ON PATHWAY REGIMEN - Non-Small Cell Lung     A cycle is every 21 days:     Paclitaxel      Carboplatin   **Always confirm dose/schedule in your pharmacy ordering system**    Patient Characteristics: Stage IV Metastatic, Squamous, PS = 0, 1, Second Line, Prior PD-1 / PD-L1 Inhibitor AJCC T Category: T2b Current Disease Status: Distant Metastases AJCC N Category: N2 AJCC M Category: M1c AJCC 8 Stage Grouping: IVB Histology: Squamous Cell Line of therapy: Second Line PD-L1 Expression Status: PD-L1 Positive ? 50% (TPS) Performance Status: PS = 0, 1 Would you be surprised if this patient died  in the next year<= I would NOT be surprised if this patient died in the next year Immunotherapy Candidate Status: Not a Candidate for Immunotherapy Prior Immunotherapy Status: Prior PD-1/PD-L1 Inhibitor Intent of Therapy: Non-Curative / Palliative Intent, Discussed with Patient

## 2017-08-19 ENCOUNTER — Ambulatory Visit: Payer: Medicare Other | Admitting: Internal Medicine

## 2017-08-19 ENCOUNTER — Ambulatory Visit: Payer: Medicare Other

## 2017-08-19 ENCOUNTER — Other Ambulatory Visit: Payer: Medicare Other

## 2017-08-20 ENCOUNTER — Other Ambulatory Visit: Payer: Self-pay | Admitting: Medical Oncology

## 2017-08-20 DIAGNOSIS — C3491 Malignant neoplasm of unspecified part of right bronchus or lung: Secondary | ICD-10-CM

## 2017-08-20 MED ORDER — OXYCODONE HCL 5 MG PO TABS: 5.0000 mg | ORAL_TABLET | Freq: Four times a day (QID) | ORAL | 0 refills | Status: DC | PRN

## 2017-08-20 NOTE — Progress Notes (Signed)
Pt notified to pick up rx.

## 2017-08-23 ENCOUNTER — Encounter: Payer: Self-pay | Admitting: *Deleted

## 2017-08-23 NOTE — Progress Notes (Signed)
Noted patient is schedule for lab/flush on 08/24/17 as well as on 08/26/17. Research labs will be drawn for S1400 study at appropriate lab appointment. Notified collaborative nurse, Armanda Magic, RN regarding two lab appointments to f/u to determine if both are needed or 12/4 labs can be done on 12/06. Mauri Reading Michaelle Copas Therapist, sports, BSN Clinical Research Nurse 08/23/17 @ 1030

## 2017-08-24 ENCOUNTER — Ambulatory Visit: Payer: Medicare Other | Admitting: Oncology

## 2017-08-24 ENCOUNTER — Other Ambulatory Visit (HOSPITAL_BASED_OUTPATIENT_CLINIC_OR_DEPARTMENT_OTHER): Payer: Medicare Other

## 2017-08-24 ENCOUNTER — Other Ambulatory Visit: Payer: Self-pay | Admitting: *Deleted

## 2017-08-24 ENCOUNTER — Ambulatory Visit (HOSPITAL_BASED_OUTPATIENT_CLINIC_OR_DEPARTMENT_OTHER): Payer: Medicare Other

## 2017-08-24 DIAGNOSIS — C3491 Malignant neoplasm of unspecified part of right bronchus or lung: Secondary | ICD-10-CM

## 2017-08-24 DIAGNOSIS — E860 Dehydration: Secondary | ICD-10-CM

## 2017-08-24 DIAGNOSIS — C3481 Malignant neoplasm of overlapping sites of right bronchus and lung: Secondary | ICD-10-CM

## 2017-08-24 LAB — CBC WITH DIFFERENTIAL/PLATELET
BASO%: 0.5 % (ref 0.0–2.0)
Basophils Absolute: 0 10*3/uL (ref 0.0–0.1)
EOS ABS: 0.1 10*3/uL (ref 0.0–0.5)
EOS%: 1.1 % (ref 0.0–7.0)
HCT: 31.6 % — ABNORMAL LOW (ref 34.8–46.6)
HGB: 10.4 g/dL — ABNORMAL LOW (ref 11.6–15.9)
LYMPH%: 9.5 % — AB (ref 14.0–49.7)
MCH: 29.9 pg (ref 25.1–34.0)
MCHC: 32.9 g/dL (ref 31.5–36.0)
MCV: 90.8 fL (ref 79.5–101.0)
MONO#: 0.6 10*3/uL (ref 0.1–0.9)
MONO%: 9.5 % (ref 0.0–14.0)
NEUT#: 5.2 10*3/uL (ref 1.5–6.5)
NEUT%: 79.4 % — AB (ref 38.4–76.8)
PLATELETS: 292 10*3/uL (ref 145–400)
RBC: 3.48 10*6/uL — AB (ref 3.70–5.45)
RDW: 17.4 % — ABNORMAL HIGH (ref 11.2–14.5)
WBC: 6.5 10*3/uL (ref 3.9–10.3)
lymph#: 0.6 10*3/uL — ABNORMAL LOW (ref 0.9–3.3)

## 2017-08-24 LAB — COMPREHENSIVE METABOLIC PANEL
ALK PHOS: 74 U/L (ref 40–150)
ALT: 9 U/L (ref 0–55)
ANION GAP: 10 meq/L (ref 3–11)
AST: 30 U/L (ref 5–34)
Albumin: 3 g/dL — ABNORMAL LOW (ref 3.5–5.0)
BILIRUBIN TOTAL: 0.68 mg/dL (ref 0.20–1.20)
BUN: 7.2 mg/dL (ref 7.0–26.0)
CO2: 24 meq/L (ref 22–29)
Calcium: 9.5 mg/dL (ref 8.4–10.4)
Chloride: 104 mEq/L (ref 98–109)
Creatinine: 0.9 mg/dL (ref 0.6–1.1)
GLUCOSE: 148 mg/dL — AB (ref 70–140)
POTASSIUM: 3.3 meq/L — AB (ref 3.5–5.1)
SODIUM: 137 meq/L (ref 136–145)
TOTAL PROTEIN: 7.6 g/dL (ref 6.4–8.3)

## 2017-08-24 LAB — RESEARCH LABS

## 2017-08-24 MED ORDER — HEPARIN SOD (PORK) LOCK FLUSH 100 UNIT/ML IV SOLN
500.0000 [IU] | Freq: Once | INTRAVENOUS | Status: AC | PRN
  Administered 2017-08-24: 500 [IU]
  Filled 2017-08-24: qty 5

## 2017-08-24 MED ORDER — SODIUM CHLORIDE 0.9% FLUSH
10.0000 mL | INTRAVENOUS | Status: DC | PRN
  Administered 2017-08-24: 10 mL
  Filled 2017-08-24: qty 10

## 2017-08-25 ENCOUNTER — Telehealth: Payer: Self-pay | Admitting: *Deleted

## 2017-08-25 MED ORDER — POTASSIUM CHLORIDE CRYS ER 20 MEQ PO TBCR: 20.0000 meq | EXTENDED_RELEASE_TABLET | Freq: Every day | ORAL | 0 refills | Status: DC

## 2017-08-25 NOTE — Telephone Encounter (Signed)
TCT patient regarding new prescription for KCL 20 meq. Pt's last K+ was 3.3. No answer but was able to leave message on identified phone.  Prescription for KCL was sent to [facility].

## 2017-08-26 ENCOUNTER — Ambulatory Visit (HOSPITAL_BASED_OUTPATIENT_CLINIC_OR_DEPARTMENT_OTHER): Payer: Medicare Other | Admitting: Internal Medicine

## 2017-08-26 ENCOUNTER — Encounter: Payer: Self-pay | Admitting: Internal Medicine

## 2017-08-26 ENCOUNTER — Other Ambulatory Visit (HOSPITAL_BASED_OUTPATIENT_CLINIC_OR_DEPARTMENT_OTHER): Payer: Medicare Other

## 2017-08-26 ENCOUNTER — Ambulatory Visit (HOSPITAL_BASED_OUTPATIENT_CLINIC_OR_DEPARTMENT_OTHER): Payer: Medicare Other

## 2017-08-26 ENCOUNTER — Ambulatory Visit: Payer: Medicare Other

## 2017-08-26 VITALS — BP 102/75 | HR 124 | Temp 98.0°F | Resp 19 | Ht 68.0 in | Wt 202.1 lb

## 2017-08-26 VITALS — BP 112/79 | HR 95 | Temp 98.0°F | Resp 18

## 2017-08-26 DIAGNOSIS — C3491 Malignant neoplasm of unspecified part of right bronchus or lung: Secondary | ICD-10-CM | POA: Diagnosis not present

## 2017-08-26 DIAGNOSIS — E860 Dehydration: Secondary | ICD-10-CM

## 2017-08-26 DIAGNOSIS — C7989 Secondary malignant neoplasm of other specified sites: Secondary | ICD-10-CM

## 2017-08-26 DIAGNOSIS — Z5189 Encounter for other specified aftercare: Secondary | ICD-10-CM | POA: Diagnosis not present

## 2017-08-26 DIAGNOSIS — C782 Secondary malignant neoplasm of pleura: Secondary | ICD-10-CM

## 2017-08-26 DIAGNOSIS — Z5111 Encounter for antineoplastic chemotherapy: Secondary | ICD-10-CM

## 2017-08-26 LAB — CBC WITH DIFFERENTIAL/PLATELET
BASO%: 0.5 % (ref 0.0–2.0)
BASOS ABS: 0 10*3/uL (ref 0.0–0.1)
EOS ABS: 0 10*3/uL (ref 0.0–0.5)
EOS%: 0.5 % (ref 0.0–7.0)
HCT: 31.2 % — ABNORMAL LOW (ref 34.8–46.6)
HGB: 10.2 g/dL — ABNORMAL LOW (ref 11.6–15.9)
LYMPH%: 11.3 % — AB (ref 14.0–49.7)
MCH: 29.7 pg (ref 25.1–34.0)
MCHC: 32.7 g/dL (ref 31.5–36.0)
MCV: 90.7 fL (ref 79.5–101.0)
MONO#: 0.4 10*3/uL (ref 0.1–0.9)
MONO%: 6.7 % (ref 0.0–14.0)
NEUT%: 81 % — ABNORMAL HIGH (ref 38.4–76.8)
NEUTROS ABS: 5.3 10*3/uL (ref 1.5–6.5)
Platelets: 298 10*3/uL (ref 145–400)
RBC: 3.44 10*6/uL — AB (ref 3.70–5.45)
RDW: 17.2 % — AB (ref 11.2–14.5)
WBC: 6.5 10*3/uL (ref 3.9–10.3)
lymph#: 0.7 10*3/uL — ABNORMAL LOW (ref 0.9–3.3)

## 2017-08-26 LAB — COMPREHENSIVE METABOLIC PANEL
ALT: 9 U/L (ref 0–55)
AST: 32 U/L (ref 5–34)
Albumin: 3 g/dL — ABNORMAL LOW (ref 3.5–5.0)
Alkaline Phosphatase: 72 U/L (ref 40–150)
Anion Gap: 12 mEq/L — ABNORMAL HIGH (ref 3–11)
BILIRUBIN TOTAL: 0.77 mg/dL (ref 0.20–1.20)
BUN: 5.7 mg/dL — ABNORMAL LOW (ref 7.0–26.0)
CHLORIDE: 105 meq/L (ref 98–109)
CO2: 22 meq/L (ref 22–29)
CREATININE: 0.8 mg/dL (ref 0.6–1.1)
Calcium: 9.4 mg/dL (ref 8.4–10.4)
GLUCOSE: 139 mg/dL (ref 70–140)
Potassium: 3.4 mEq/L — ABNORMAL LOW (ref 3.5–5.1)
SODIUM: 139 meq/L (ref 136–145)
TOTAL PROTEIN: 7.7 g/dL (ref 6.4–8.3)

## 2017-08-26 MED ORDER — HEPARIN SOD (PORK) LOCK FLUSH 100 UNIT/ML IV SOLN
500.0000 [IU] | Freq: Once | INTRAVENOUS | Status: AC | PRN
  Administered 2017-08-26: 500 [IU]
  Filled 2017-08-26: qty 5

## 2017-08-26 MED ORDER — SODIUM CHLORIDE 0.9 % IV SOLN
20.0000 mg | Freq: Once | INTRAVENOUS | Status: AC
  Administered 2017-08-26: 20 mg via INTRAVENOUS
  Filled 2017-08-26: qty 2

## 2017-08-26 MED ORDER — SODIUM CHLORIDE 0.9 % IV SOLN
Freq: Once | INTRAVENOUS | Status: AC
Start: 2017-08-26 — End: 2017-08-26
  Administered 2017-08-26: 11:00:00 via INTRAVENOUS

## 2017-08-26 MED ORDER — FAMOTIDINE IN NACL 20-0.9 MG/50ML-% IV SOLN
INTRAVENOUS | Status: AC
  Filled 2017-08-26: qty 50

## 2017-08-26 MED ORDER — SODIUM CHLORIDE 0.9% FLUSH
10.0000 mL | INTRAVENOUS | Status: DC | PRN
  Administered 2017-08-26: 10 mL
  Filled 2017-08-26: qty 10

## 2017-08-26 MED ORDER — FAMOTIDINE IN NACL 20-0.9 MG/50ML-% IV SOLN
20.0000 mg | Freq: Once | INTRAVENOUS | Status: AC
  Administered 2017-08-26: 20 mg via INTRAVENOUS

## 2017-08-26 MED ORDER — PALONOSETRON HCL INJECTION 0.25 MG/5ML
0.2500 mg | Freq: Once | INTRAVENOUS | Status: AC
  Administered 2017-08-26: 0.25 mg via INTRAVENOUS

## 2017-08-26 MED ORDER — DIPHENHYDRAMINE HCL 50 MG/ML IJ SOLN
50.0000 mg | Freq: Once | INTRAMUSCULAR | Status: AC
  Administered 2017-08-26: 50 mg via INTRAVENOUS

## 2017-08-26 MED ORDER — DIPHENHYDRAMINE HCL 50 MG/ML IJ SOLN
INTRAMUSCULAR | Status: AC
Start: 2017-08-26 — End: ?
  Filled 2017-08-26: qty 1

## 2017-08-26 MED ORDER — PACLITAXEL CHEMO INJECTION 300 MG/50ML
175.0000 mg/m2 | Freq: Once | INTRAVENOUS | Status: AC
  Administered 2017-08-26: 366 mg via INTRAVENOUS
  Filled 2017-08-26: qty 61

## 2017-08-26 MED ORDER — SODIUM CHLORIDE 0.9 % IV SOLN
700.0000 mg | Freq: Once | INTRAVENOUS | Status: AC
  Administered 2017-08-26: 700 mg via INTRAVENOUS
  Filled 2017-08-26: qty 70

## 2017-08-26 MED ORDER — PALONOSETRON HCL INJECTION 0.25 MG/5ML
INTRAVENOUS | Status: AC
  Filled 2017-08-26: qty 5

## 2017-08-26 MED ORDER — PEGFILGRASTIM 6 MG/0.6ML ~~LOC~~ PSKT
6.0000 mg | PREFILLED_SYRINGE | Freq: Once | SUBCUTANEOUS | Status: AC
  Administered 2017-08-26: 6 mg via SUBCUTANEOUS
  Filled 2017-08-26: qty 0.6

## 2017-08-26 NOTE — Progress Notes (Signed)
Pt's HR at 110 to 120 okay to treat per Dr. Julien Nordmann.

## 2017-08-26 NOTE — Patient Instructions (Signed)
[facility] Discharge Instructions for Patients Receiving Chemotherapy  Today you received the following chemotherapy agents Taxol and Carboplatin  To help prevent nausea and vomiting after your treatment, we encourage you to take your nausea medication as directed  If you develop nausea and vomiting that is not controlled by your nausea medication, call the clinic.   BELOW ARE SYMPTOMS THAT SHOULD BE REPORTED IMMEDIATELY:  *FEVER GREATER THAN 100.5 F  *CHILLS WITH OR WITHOUT FEVER  NAUSEA AND VOMITING THAT IS NOT CONTROLLED WITH YOUR NAUSEA MEDICATION  *UNUSUAL SHORTNESS OF BREATH  *UNUSUAL BRUISING OR BLEEDING  TENDERNESS IN MOUTH AND THROAT WITH OR WITHOUT PRESENCE OF ULCERS  *URINARY PROBLEMS  *BOWEL PROBLEMS  UNUSUAL RASH Items with * indicate a potential emergency and should be followed up as soon as possible.  Feel free to call the clinic should you have any questions or concerns. The clinic phone number is [phone].  Please show the [place] at check-in to the Emergency Department and triage nurse.    Paclitaxel injection What is this medicine? PACLITAXEL (PAK li TAX el) is a chemotherapy drug. It targets fast dividing cells, like cancer cells, and causes these cells to die. This medicine is used to treat ovarian cancer, breast cancer, and other cancers. This medicine may be used for other purposes; ask your health care provider or pharmacist if you have questions. COMMON BRAND NAME(S): Onxol, Taxol What should I tell my health care provider before I take this medicine? They need to know if you have any of these conditions: -blood disorders -irregular heartbeat -infection (especially a virus infection such as chickenpox, cold sores, or herpes) -liver disease -previous or ongoing radiation therapy -an unusual or allergic reaction to paclitaxel, alcohol, polyoxyethylated castor oil, other chemotherapy agents, other medicines, foods,  dyes, or preservatives -pregnant or trying to get pregnant -breast-feeding How should I use this medicine? This drug is given as an infusion into a vein. It is administered in a hospital or clinic by a specially trained health care professional. Talk to your pediatrician regarding the use of this medicine in children. Special care may be needed. Overdosage: If you think you have taken too much of this medicine contact a poison control center or emergency room at once. NOTE: This medicine is only for you. Do not share this medicine with others. What if I miss a dose? It is important not to miss your dose. Call your doctor or health care professional if you are unable to keep an appointment. What may interact with this medicine? Do not take this medicine with any of the following medications: -disulfiram -metronidazole This medicine may also interact with the following medications: -cyclosporine -diazepam -ketoconazole -medicines to increase blood counts like filgrastim, pegfilgrastim, sargramostim -other chemotherapy drugs like cisplatin, doxorubicin, epirubicin, etoposide, teniposide, vincristine -quinidine -testosterone -vaccines -verapamil Talk to your doctor or health care professional before taking any of these medicines: -acetaminophen -aspirin -ibuprofen -ketoprofen -naproxen This list may not describe all possible interactions. Give your health care provider a list of all the medicines, herbs, non-prescription drugs, or dietary supplements you use. Also tell them if you smoke, drink alcohol, or use illegal drugs. Some items may interact with your medicine. What should I watch for while using this medicine? Your condition will be monitored carefully while you are receiving this medicine. You will need important blood work done while you are taking this medicine. This medicine can cause serious allergic reactions. To reduce your risk you will  need to take other medicine(s)  before treatment with this medicine. If you experience allergic reactions like skin rash, itching or hives, swelling of the face, lips, or tongue, tell your doctor or health care professional right away. In some cases, you may be given additional medicines to help with side effects. Follow all directions for their use. This drug may make you feel generally unwell. This is not uncommon, as chemotherapy can affect healthy cells as well as cancer cells. Report any side effects. Continue your course of treatment even though you feel ill unless your doctor tells you to stop. Call your doctor or health care professional for advice if you get a fever, chills or sore throat, or other symptoms of a cold or flu. Do not treat yourself. This drug decreases your body's ability to fight infections. Try to avoid being around people who are sick. This medicine may increase your risk to bruise or bleed. Call your doctor or health care professional if you notice any unusual bleeding. Be careful brushing and flossing your teeth or using a toothpick because you may get an infection or bleed more easily. If you have any dental work done, tell your dentist you are receiving this medicine. Avoid taking products that contain aspirin, acetaminophen, ibuprofen, naproxen, or ketoprofen unless instructed by your doctor. These medicines may hide a fever. Do not become pregnant while taking this medicine. Women should inform their doctor if they wish to become pregnant or think they might be pregnant. There is a potential for serious side effects to an unborn child. Talk to your health care professional or pharmacist for more information. Do not breast-feed an infant while taking this medicine. Men are advised not to father a child while receiving this medicine. This product may contain alcohol. Ask your pharmacist or healthcare provider if this medicine contains alcohol. Be sure to tell all healthcare providers you are taking this  medicine. Certain medicines, like metronidazole and disulfiram, can cause an unpleasant reaction when taken with alcohol. The reaction includes flushing, headache, nausea, vomiting, sweating, and increased thirst. The reaction can last from 30 minutes to several hours. What side effects may I notice from receiving this medicine? Side effects that you should report to your doctor or health care professional as soon as possible: -allergic reactions like skin rash, itching or hives, swelling of the face, lips, or tongue -low blood counts - This drug may decrease the number of white blood cells, red blood cells and platelets. You may be at increased risk for infections and bleeding. -signs of infection - fever or chills, cough, sore throat, pain or difficulty passing urine -signs of decreased platelets or bleeding - bruising, pinpoint red spots on the skin, black, tarry stools, nosebleeds -signs of decreased red blood cells - unusually weak or tired, fainting spells, lightheadedness -breathing problems -chest pain -high or low blood pressure -mouth sores -nausea and vomiting -pain, swelling, redness or irritation at the injection site -pain, tingling, numbness in the hands or feet -slow or irregular heartbeat -swelling of the ankle, feet, hands Side effects that usually do not require medical attention (report to your doctor or health care professional if they continue or are bothersome): -bone pain -complete hair loss including hair on your head, underarms, pubic hair, eyebrows, and eyelashes -changes in the color of fingernails -diarrhea -loosening of the fingernails -loss of appetite -muscle or joint pain -red flush to skin -sweating This list may not describe all possible side effects. Call your doctor for medical  advice about side effects. You may report side effects to FDA at 1-800-FDA-1088. Where should I keep my medicine? This drug is given in a hospital or clinic and will not be  stored at home. NOTE: This sheet is a summary. It may not cover all possible information. If you have questions about this medicine, talk to your doctor, pharmacist, or health care provider.  2018 [employer] Standard (2015-07-09 19:58:00)   Carboplatin injection What is this medicine? CARBOPLATIN (KAR boe pla tin) is a chemotherapy drug. It targets fast dividing cells, like cancer cells, and causes these cells to die. This medicine is used to treat ovarian cancer and many other cancers. This medicine may be used for other purposes; ask your health care provider or pharmacist if you have questions. COMMON BRAND NAME(S): Paraplatin What should I tell my health care provider before I take this medicine? They need to know if you have any of these conditions: -blood disorders -hearing problems -kidney disease -recent or ongoing radiation therapy -an unusual or allergic reaction to carboplatin, cisplatin, other chemotherapy, other medicines, foods, dyes, or preservatives -pregnant or trying to get pregnant -breast-feeding How should I use this medicine? This drug is usually given as an infusion into a vein. It is administered in a hospital or clinic by a specially trained health care professional. Talk to your pediatrician regarding the use of this medicine in children. Special care may be needed. Overdosage: If you think you have taken too much of this medicine contact a poison control center or emergency room at once. NOTE: This medicine is only for you. Do not share this medicine with others. What if I miss a dose? It is important not to miss a dose. Call your doctor or health care professional if you are unable to keep an appointment. What may interact with this medicine? -medicines for seizures -medicines to increase blood counts like filgrastim, pegfilgrastim, sargramostim -some antibiotics like amikacin, gentamicin, neomycin, streptomycin, tobramycin -vaccines Talk to your doctor  or health care professional before taking any of these medicines: -acetaminophen -aspirin -ibuprofen -ketoprofen -naproxen This list may not describe all possible interactions. Give your health care provider a list of all the medicines, herbs, non-prescription drugs, or dietary supplements you use. Also tell them if you smoke, drink alcohol, or use illegal drugs. Some items may interact with your medicine. What should I watch for while using this medicine? Your condition will be monitored carefully while you are receiving this medicine. You will need important blood work done while you are taking this medicine. This drug may make you feel generally unwell. This is not uncommon, as chemotherapy can affect healthy cells as well as cancer cells. Report any side effects. Continue your course of treatment even though you feel ill unless your doctor tells you to stop. In some cases, you may be given additional medicines to help with side effects. Follow all directions for their use. Call your doctor or health care professional for advice if you get a fever, chills or sore throat, or other symptoms of a cold or flu. Do not treat yourself. This drug decreases your body's ability to fight infections. Try to avoid being around people who are sick. This medicine may increase your risk to bruise or bleed. Call your doctor or health care professional if you notice any unusual bleeding. Be careful brushing and flossing your teeth or using a toothpick because you may get an infection or bleed more easily. If you have any dental work done, tell  your dentist you are receiving this medicine. Avoid taking products that contain aspirin, acetaminophen, ibuprofen, naproxen, or ketoprofen unless instructed by your doctor. These medicines may hide a fever. Do not become pregnant while taking this medicine. Women should inform their doctor if they wish to become pregnant or think they might be pregnant. There is a potential  for serious side effects to an unborn child. Talk to your health care professional or pharmacist for more information. Do not breast-feed an infant while taking this medicine. What side effects may I notice from receiving this medicine? Side effects that you should report to your doctor or health care professional as soon as possible: -allergic reactions like skin rash, itching or hives, swelling of the face, lips, or tongue -signs of infection - fever or chills, cough, sore throat, pain or difficulty passing urine -signs of decreased platelets or bleeding - bruising, pinpoint red spots on the skin, black, tarry stools, nosebleeds -signs of decreased red blood cells - unusually weak or tired, fainting spells, lightheadedness -breathing problems -changes in hearing -changes in vision -chest pain -high blood pressure -low blood counts - This drug may decrease the number of white blood cells, red blood cells and platelets. You may be at increased risk for infections and bleeding. -nausea and vomiting -pain, swelling, redness or irritation at the injection site -pain, tingling, numbness in the hands or feet -problems with balance, talking, walking -trouble passing urine or change in the amount of urine Side effects that usually do not require medical attention (report to your doctor or health care professional if they continue or are bothersome): -hair loss -loss of appetite -metallic taste in the mouth or changes in taste This list may not describe all possible side effects. Call your doctor for medical advice about side effects. You may report side effects to FDA at 1-800-FDA-1088. Where should I keep my medicine? This drug is given in a hospital or clinic and will not be stored at home. NOTE: This sheet is a summary. It may not cover all possible information. If you have questions about this medicine, talk to your doctor, pharmacist, or health care provider.  2018 [employer] Standard  (2007-12-13 14:38:05)    Pegfilgrastim injection What is this medicine? PEGFILGRASTIM (PEG fil gra stim) is a long-acting granulocyte colony-stimulating factor that stimulates the growth of neutrophils, a type of white blood cell important in the body's fight against infection. It is used to reduce the incidence of fever and infection in patients with certain types of cancer who are receiving chemotherapy that affects the bone marrow, and to increase survival after being exposed to high doses of radiation. This medicine may be used for other purposes; ask your health care provider or pharmacist if you have questions. COMMON BRAND NAME(S): Neulasta What should I tell my health care provider before I take this medicine? They need to know if you have any of these conditions: -kidney disease -latex allergy -ongoing radiation therapy -sickle cell disease -skin reactions to acrylic adhesives (On-Body Injector only) -an unusual or allergic reaction to pegfilgrastim, filgrastim, other medicines, foods, dyes, or preservatives -pregnant or trying to get pregnant -breast-feeding How should I use this medicine? This medicine is for injection under the skin. If you get this medicine at home, you will be taught how to prepare and give the pre-filled syringe or how to use the On-body Injector. Refer to the patient Instructions for Use for detailed instructions. Use exactly as directed. Tell your healthcare provider immediately if you  suspect that the On-body Injector may not have performed as intended or if you suspect the use of the On-body Injector resulted in a missed or partial dose. It is important that you put your used needles and syringes in a special sharps container. Do not put them in a trash can. If you do not have a sharps container, call your pharmacist or healthcare provider to get one. Talk to your pediatrician regarding the use of this medicine in children. While this drug may be prescribed  for selected conditions, precautions do apply. Overdosage: If you think you have taken too much of this medicine contact a poison control center or emergency room at once. NOTE: This medicine is only for you. Do not share this medicine with others. What if I miss a dose? It is important not to miss your dose. Call your doctor or health care professional if you miss your dose. If you miss a dose due to an On-body Injector failure or leakage, a new dose should be administered as soon as possible using a single prefilled syringe for manual use. What may interact with this medicine? Interactions have not been studied. Give your health care provider a list of all the medicines, herbs, non-prescription drugs, or dietary supplements you use. Also tell them if you smoke, drink alcohol, or use illegal drugs. Some items may interact with your medicine. This list may not describe all possible interactions. Give your health care provider a list of all the medicines, herbs, non-prescription drugs, or dietary supplements you use. Also tell them if you smoke, drink alcohol, or use illegal drugs. Some items may interact with your medicine. What should I watch for while using this medicine? You may need blood work done while you are taking this medicine. If you are going to need a MRI, CT scan, or other procedure, tell your doctor that you are using this medicine (On-Body Injector only). What side effects may I notice from receiving this medicine? Side effects that you should report to your doctor or health care professional as soon as possible: -allergic reactions like skin rash, itching or hives, swelling of the face, lips, or tongue -dizziness -fever -pain, redness, or irritation at site where injected -pinpoint red spots on the skin -red or dark-brown urine -shortness of breath or breathing problems -stomach or side pain, or pain at the shoulder -swelling -tiredness -trouble passing urine or change in the  amount of urine Side effects that usually do not require medical attention (report to your doctor or health care professional if they continue or are bothersome): -bone pain -muscle pain This list may not describe all possible side effects. Call your doctor for medical advice about side effects. You may report side effects to FDA at 1-800-FDA-1088. Where should I keep my medicine? Keep out of the reach of children. Store pre-filled syringes in a refrigerator between 2 and 8 degrees C (36 and 46 degrees F). Do not freeze. Keep in carton to protect from light. Throw away this medicine if it is left out of the refrigerator for more than 48 hours. Throw away any unused medicine after the expiration date. NOTE: This sheet is a summary. It may not cover all possible information. If you have questions about this medicine, talk to your doctor, pharmacist, or health care provider.  2018 [employer] Standard (2016-09-03 12:58:03)

## 2017-08-26 NOTE — Progress Notes (Signed)
[facility] Telephone:([phone]) [phone]   [phone]) [phone]  OFFICE PROGRESS NOTE  [name], PA-C [place] [address]  DIAGNOSIS: Metastatic non-small cell lung cancer initially diagnosed as Stage IIIA (T2b, N2, M0) non-small cell lung cancer, invasive poorly differentiated squamous cell carcinoma diagnosed in November 2016 and presented with large right hilar mass with collapse of the right middle lobe and extension into the right upper lobe and right lower lobe along the hilum with right paratracheal and subcarinal lymphadenopathy. She presented in July 2018 with metastatic intramuscular mass in the right posterior lateral chest wall, with biopsy confirmed squamous cell carcinoma. PDL1 Expression: 50%.  PRIOR THERAPY:  1) Concurrent chemoradiation with weekly carboplatin for AUC of 2 and paclitaxel 45 MG/M2. First dose 08/19/2015. Status post 5 cycles. Last cycle was given 10/07/2015 with significant response. 2) Consolidation chemotherapy with carboplatin for AUC of 5 and paclitaxel 175 MG/M2 every 3 weeks with Neulasta support. First dose 11/18/2015. Status post 3 cycles. 3) palliative radiotherapy to the intramuscular mass in the right posterior lateral chest wall under the care of Dr. Tammi Klippel. 4) Ketruda 200 mg IV every 3 weeks. First dose 05/12/2017. Status post 3 cycles.  This was discontinued secondary to disease progression.  CURRENT THERAPY: Systemic chemotherapy with carboplatin for AC of 5 and paclitaxel 175 mg/M2 every 3 weeks with Neulasta support.  First dose August 26, 2017..   INTERVAL HISTORY: Theresa [name] 56 y.o. female returns to the clinic today for follow-up visit.  The patient is feeling fine today with no specific complaints except for mild fatigue and some tenderness in the right scapular area from the tumor located at that area.  She denied having any chest pain, shortness of breath, cough or hemoptysis.  She denied having any  recent nausea, vomiting, diarrhea or constipation.  She has no fever or chills.  She is here today to start the first cycle of systemic chemotherapy with carboplatin and paclitaxel.   MEDICAL HISTORY: Past Medical History:  Diagnosis Date  . Cancer ([place])    5 years ago - cervical cancer  . Encounter for antineoplastic chemotherapy 08/27/2015  . History of radiation therapy 08/19/2015 - 10/07/2015   Site/dose:   The patient's primary tumor and involved lymph nodes were treated to 66 Gy in 30 fractions.  . Hypokalemia 09/30/2015  . Non-small cell carcinoma of lung, stage 3 ([place]) 08/08/2015  . Paroxysmal atrial flutter ([place]) 09/16/2015  . Pneumonia     ALLERGIES:  has No Known Allergies.  MEDICATIONS:  Current Outpatient Medications  Medication Sig Dispense Refill  . docusate sodium (COLACE) 100 MG capsule Take 1 capsule (100 mg total) by mouth 2 (two) times daily. (Patient not taking: Reported on 07/29/2017) 60 capsule 1  . fentaNYL (DURAGESIC - DOSED MCG/HR) 50 MCG/HR Place 1 patch (50 mcg total) onto the skin every 3 (three) days. (Patient not taking: Reported on 07/08/2017) 10 patch 0  . lactulose (CHRONULAC) 10 GM/15ML solution Take 15 mLs (10 g total) by mouth 3 (three) times daily as needed for mild constipation. (Patient not taking: Reported on 07/29/2017) 240 mL 3  . lidocaine-prilocaine (EMLA) cream Apply 1 application topically as needed. 30 g 2  . linaclotide (LINZESS) 145 MCG CAPS capsule Take 145 mcg by mouth daily before breakfast.    . magnesium hydroxide (MILK OF MAGNESIA) 400 MG/5ML suspension Take 5 mLs by mouth at bedtime as needed for mild constipation.    . Multiple  Vitamin (MULTIVITAMIN) capsule Take 1 capsule daily by mouth. 30 capsule 2  . ondansetron (ZOFRAN-ODT) 8 MG disintegrating tablet Take 1 tablet (8 mg total) by mouth every 8 (eight) hours as needed for nausea or vomiting. 20 tablet 1  . oxyCODONE (OXY IR/ROXICODONE) 5 MG immediate release tablet Take 1-2  tablets (5-10 mg total) by mouth every 6 (six) hours as needed for severe pain. 60 tablet 0  . potassium chloride SA (K-DUR,KLOR-CON) 20 MEQ tablet Take 1 tablet (20 mEq total) by mouth daily. 7 tablet 0  . prochlorperazine (COMPAZINE) 10 MG tablet Take 1 tablet (10 mg total) by mouth every 6 (six) hours as needed for nausea or vomiting. 30 tablet 1   No current facility-administered medications for this visit.    Facility-Administered Medications Ordered in Other Visits  Medication Dose Route Frequency Provider Last Rate Last Dose  . 0.9 %  sodium chloride infusion   Intravenous Continuous Curcio, Kristin R, NP      . 0.9 %  sodium chloride infusion   Intravenous Continuous Curt Bears, MD   Stopped at 05/31/17 1714  . ondansetron (ZOFRAN) injection 8 mg  8 mg Intravenous Once Curt Bears, MD      . sodium chloride flush (NS) 0.9 % injection 10 mL  10 mL Intracatheter PRN Curt Bears, MD   10 mL at 08/26/17 2641    SURGICAL HISTORY:  Past Surgical History:  Procedure Laterality Date  . IR FLUORO GUIDE PORT INSERTION RIGHT  05/21/2017  . IR US GUIDE VASC ACCESS RIGHT  05/21/2017  . TUBAL LIGATION    . VIDEO BRONCHOSCOPY WITH ENDOBRONCHIAL ULTRASOUND N/A 08/02/2015   Procedure: VIDEO BRONCHOSCOPY WITH ENDOBRONCHIAL ULTRASOUND;  Surgeon: [name], MD;  Location: [facility];  Service: Thoracic;  Laterality: N/A;  . WISDOM TOOTH EXTRACTION      REVIEW OF SYSTEMS:  A comprehensive review of systems was negative except for: Constitutional: positive for fatigue Neurological: positive for paresthesia   PHYSICAL EXAMINATION: General appearance: alert, cooperative, fatigued and no distress Head: Normocephalic, without obvious abnormality, atraumatic Neck: no adenopathy, no JVD, supple, symmetrical, trachea midline and thyroid not enlarged, symmetric, no tenderness/mass/nodules Lymph nodes: Cervical, supraclavicular, and axillary nodes normal. Resp: clear to auscultation  bilaterally Back: symmetric, no curvature. ROM normal. No CVA tenderness. Cardio: regular rate and rhythm, S1, S2 normal, no murmur, click, rub or gallop GI: soft, non-tender; bowel sounds normal; no masses,  no organomegaly Extremities: extremities normal, atraumatic, no cyanosis or edema  Examination of the back showed a palpable mass above the right scapula.  ECOG PERFORMANCE STATUS: 1 - Symptomatic but completely ambulatory  Blood pressure 102/75, pulse (!) 124, temperature 98 F (36.7 C), temperature source Oral, resp. rate 19, height _0  (1.727 m), weight 202 lb 1.6 oz (91.7 kg), SpO2 99 %.  LABORATORY DATA: Lab Results  Component Value Date   WBC 6.5 08/24/2017   HGB 10.4 (L) 08/24/2017   HCT 31.6 (L) 08/24/2017   MCV 90.8 08/24/2017   PLT 292 08/24/2017      Chemistry      Component Value Date/Time   NA 137 08/24/2017 0931   K 3.3 (L) 08/24/2017 0931   CL 99 (L) 04/19/2017 1207   CO2 24 08/24/2017 0931   BUN 7.2 08/24/2017 0931   CREATININE 0.9 08/24/2017 0931      Component Value Date/Time   CALCIUM 9.5 08/24/2017 0931   ALKPHOS 74 08/24/2017 0931   AST 30 08/24/2017 0931  ALT 9 08/24/2017 0931   BILITOT 0.68 08/24/2017 0931       RADIOGRAPHIC STUDIES: Dg Chest 1 View  Result Date: 08/02/2017 CLINICAL DATA:  56 year old female status post ultrasound-guided right side thoracentesis. Metastatic right side lung cancer. EXAM: CHEST 1 VIEW COMPARISON:  Chest CT 07/27/2017.  Chest radiographs 03/11/2017. FINDINGS: Upright PA view of the chest at 1503 hours. Significantly decreased right pleural effusions size since the recent CT with small to moderate-sized residual dependent right pleural fluid. Conspicuous linear boundary in the aerated right lung and although there are lung markings peripheral to this boundary, the appearance is suspicious for loculated pneumothorax due to impaired re-expansion of the right lung. Associated residual confluent  perihilar/infrahilar opacity. Stable right chest porta cath. Stable cardiac size and mediastinal contours. The left lung appears clear. Negative visible bowel gas pattern. No acute osseous abnormality identified. IMPRESSION: 1. Suspicion of moderate-size loculated right hydropneumothorax following right side thoracentesis due to impaired re-expansion of the right lung. Recommend confirmation with Chest CT prior to any chest tube intervention. 2. Stable mediastinal contour.  Stable left lung. 3. Study discussed by telephone with IR PA Shawna Orleans on 08/02/2017 at 15:24 . Electronically Signed   By: Genevie Ann M.D.   On: 08/02/2017 15:25   Dg Chest 2 View  Result Date: 08/03/2017 CLINICAL DATA:  Followup right thoracentesis. EXAM: CHEST  2 VIEW COMPARISON:  08/02/2017 FINDINGS: Hydropneumothorax on the right persists. No mediastinal shift. Slight re-accumulation of pleural fluid on the right. Persistent pleural air on the right appears slightly increased, but I think this is more likely secondary to the fact that the pleural fluid is reaccumulating rather than persistent air leak. Left chest remains clear. IMPRESSION: Persistent hydropneumothorax on the right. Slight re-accumulation of pleural fluid on the right. Amount of pleural air appear slightly increased, but this may be secondary to the fact that there is increasing pleural fluid rather than ongoing air leak. Electronically Signed   By: Nelson Chimes M.D.   On: 08/03/2017 14:05   Ct Chest W Contrast  Result Date: 07/27/2017 CLINICAL DATA:  Metastatic right-sided lung cancer. Encounter for immunotherapy. Non-small-cell. Staging. Right scapular soft tissue lump/ mass for 2-3 months. EXAM: CT CHEST, ABDOMEN, AND PELVIS WITH CONTRAST TECHNIQUE: Multidetector CT imaging of the chest, abdomen and pelvis was performed following the standard protocol during bolus administration of intravenous contrast. CONTRAST:  148m ISOVUE-300 IOPAMIDOL (ISOVUE-300) INJECTION  61% COMPARISON:  05/05/2017 PET. 04/19/2017 chest CT. Most recent abdominopelvic CT of 03/11/2017. FINDINGS: CT CHEST FINDINGS Cardiovascular: A right-sided Port-A-Cath which terminates at the high right atrium. Normal heart size, without pericardial effusion. No central pulmonary embolism, on this non-dedicated study. Abberant right subclavian artery, traversing posterior to the esophagus. Mediastinum/Nodes: No supraclavicular adenopathy. No mediastinal or definite hilar adenopathy Right internal mammary node versus anterior pleural implant. This is new, including at 1.5 cm on image 32/series 2. Lungs/Pleura: Since the prior CT, development of moderate right-sided pleural effusion. Pleural nodularity persists, with example area in the upper right hemithorax measuring 10 mm on image 75/series 2. Increased compression of right-sided endobronchial tree. Worsened right-sided aeration. Increased right perihilar consolidation and traction bronchiectasis are likely radiation induced. Persistent right lower lobe volume loss and atelectasis. Anterior right lung base nodule measures 6 mm on image 84/series 6 and is felt to be new. Posterior right apical soft tissue density is favored to represent pleural-based mass mass over atelectasis. This is new, including at 5.0 by 3.5 cm on image 8/series 2.  Musculoskeletal: Right chest wall mass with increased central necrosis. Measures on the order of 10.7 x 5.6 cm on image 35/series 2. Compare 8.9 x 5.3 cm on the prior exam. Direct extension versus adjacent adenopathy within the posterior inferior axilla, new at 1.8 cm on image 26/series 2. The right chest wall mass is contiguous with direct tumor extending from the right pleural space, as before. Example image 38/series 2. CT ABDOMEN PELVIS FINDINGS Hepatobiliary: High left hepatic lobe low-density lesions are likely cysts. Other lesions are too small to characterize but similar. Normal gallbladder, without biliary ductal  dilatation. Pancreas: Normal, without mass or ductal dilatation. Spleen: Normal in size, without focal abnormality. Adrenals/Urinary Tract: Normal adrenal glands. Normal kidneys, without hydronephrosis. Decompressed urinary bladder. Stomach/Bowel: Proximal gastric underdistention. The cecum extends into the central pelvis. Normal terminal ileum and appendix. Normal small bowel. Vascular/Lymphatic: Aortic and branch vessel atherosclerosis. No abdominopelvic adenopathy. Reproductive: Normal uterus. Right ovarian dermoid at 4.1 cm is grossly similar. Likely a smaller adjacent 1.7 cm right-sided dermoid including on image 107/series 2. Alternatively, this could represent 1 bilobed lesion. Other: No significant free fluid. No evidence of omental or peritoneal disease. Musculoskeletal: No acute osseous abnormality. IMPRESSION: 1. Since the prior diagnostic CT of 04/19/2017, disease progression within the chest. Increase in dominant right chest wall mass and right pleural based disease. Development of moderate right pleural effusion with worsened right-sided aeration. Suspect a a new anterior right lung base pulmonary nodule/metastasis. 2. No subdiaphragmatic metastatic disease identified. 3. Right ovarian dermoid or dermoids, similar. 4.  Aortic Atherosclerosis (ICD10-I70.0). Electronically Signed   By: Abigail Miyamoto M.D.   On: 07/27/2017 15:26   Dg Chest Left Decubitus  Result Date: 08/02/2017 CLINICAL DATA:  Post thoracentesis EXAM: CHEST - LEFT DECUBITUS COMPARISON:  08/02/2017, CT 07/27/2017, radiograph 03/11/2017 FINDINGS: Left side down decubitus view of the chest. Right-sided central venous port tip over the distal SVC. Residual pleural collection on the right. Similar appearance of possible pleural line laterally and apically, note is made of appearance of scarring on 03/11/2017 in the right hilar area and lower lung. Consolidation at the right base. IMPRESSION: Similar appearance of possible pleural line at  the right apex as may be seen with a hydropneumothorax ; CT would be confirmatory. Residual right likely loculated pleural effusion at the right base. Electronically Signed   By: Donavan Foil M.D.   On: 08/02/2017 16:05   Ct Abdomen Pelvis W Contrast  Result Date: 07/27/2017 CLINICAL DATA:  Metastatic right-sided lung cancer. Encounter for immunotherapy. Non-small-cell. Staging. Right scapular soft tissue lump/ mass for 2-3 months. EXAM: CT CHEST, ABDOMEN, AND PELVIS WITH CONTRAST TECHNIQUE: Multidetector CT imaging of the chest, abdomen and pelvis was performed following the standard protocol during bolus administration of intravenous contrast. CONTRAST:  19m ISOVUE-300 IOPAMIDOL (ISOVUE-300) INJECTION 61% COMPARISON:  05/05/2017 PET. 04/19/2017 chest CT. Most recent abdominopelvic CT of 03/11/2017. FINDINGS: CT CHEST FINDINGS Cardiovascular: A right-sided Port-A-Cath which terminates at the high right atrium. Normal heart size, without pericardial effusion. No central pulmonary embolism, on this non-dedicated study. Abberant right subclavian artery, traversing posterior to the esophagus. Mediastinum/Nodes: No supraclavicular adenopathy. No mediastinal or definite hilar adenopathy Right internal mammary node versus anterior pleural implant. This is new, including at 1.5 cm on image 32/series 2. Lungs/Pleura: Since the prior CT, development of moderate right-sided pleural effusion. Pleural nodularity persists, with example area in the upper right hemithorax measuring 10 mm on image 75/series 2. Increased compression of right-sided endobronchial tree. Worsened right-sided aeration.  Increased right perihilar consolidation and traction bronchiectasis are likely radiation induced. Persistent right lower lobe volume loss and atelectasis. Anterior right lung base nodule measures 6 mm on image 84/series 6 and is felt to be new. Posterior right apical soft tissue density is favored to represent pleural-based mass  mass over atelectasis. This is new, including at 5.0 by 3.5 cm on image 8/series 2. Musculoskeletal: Right chest wall mass with increased central necrosis. Measures on the order of 10.7 x 5.6 cm on image 35/series 2. Compare 8.9 x 5.3 cm on the prior exam. Direct extension versus adjacent adenopathy within the posterior inferior axilla, new at 1.8 cm on image 26/series 2. The right chest wall mass is contiguous with direct tumor extending from the right pleural space, as before. Example image 38/series 2. CT ABDOMEN PELVIS FINDINGS Hepatobiliary: High left hepatic lobe low-density lesions are likely cysts. Other lesions are too small to characterize but similar. Normal gallbladder, without biliary ductal dilatation. Pancreas: Normal, without mass or ductal dilatation. Spleen: Normal in size, without focal abnormality. Adrenals/Urinary Tract: Normal adrenal glands. Normal kidneys, without hydronephrosis. Decompressed urinary bladder. Stomach/Bowel: Proximal gastric underdistention. The cecum extends into the central pelvis. Normal terminal ileum and appendix. Normal small bowel. Vascular/Lymphatic: Aortic and branch vessel atherosclerosis. No abdominopelvic adenopathy. Reproductive: Normal uterus. Right ovarian dermoid at 4.1 cm is grossly similar. Likely a smaller adjacent 1.7 cm right-sided dermoid including on image 107/series 2. Alternatively, this could represent 1 bilobed lesion. Other: No significant free fluid. No evidence of omental or peritoneal disease. Musculoskeletal: No acute osseous abnormality. IMPRESSION: 1. Since the prior diagnostic CT of 04/19/2017, disease progression within the chest. Increase in dominant right chest wall mass and right pleural based disease. Development of moderate right pleural effusion with worsened right-sided aeration. Suspect a a new anterior right lung base pulmonary nodule/metastasis. 2. No subdiaphragmatic metastatic disease identified. 3. Right ovarian dermoid or  dermoids, similar. 4.  Aortic Atherosclerosis (ICD10-I70.0). Electronically Signed   By: Abigail Miyamoto M.D.   On: 07/27/2017 15:26   US Thoracentesis Asp Pleural Space W/img Guide  Result Date: 08/02/2017 INDICATION: Patient with history of right lung cancer, right pleural effusion. Request made for therapeutic right thoracentesis. EXAM: ULTRASOUND GUIDED THERAPEUTIC RIGHT THORACENTESIS MEDICATIONS: None. COMPLICATIONS: None immediate. PROCEDURE: An ultrasound guided thoracentesis was thoroughly discussed with the patient and questions answered. The benefits, risks, alternatives and complications were also discussed. The patient understands and wishes to proceed with the procedure. Written consent was obtained. Ultrasound was performed to localize and mark an adequate pocket of fluid in the right chest. The area was then prepped and draped in the normal sterile fashion. 1% Lidocaine was used for local anesthesia. Under ultrasound guidance a Safe-T-Centesis catheter was introduced. Thoracentesis was performed. The catheter was removed and a dressing applied. FINDINGS: A total of approximately 1.6 liters of yellow fluid was removed. IMPRESSION: Successful ultrasound guided therapeutic right thoracentesis yielding 1.6 liters of pleural fluid. Postprocedure chest x-ray revealed suspicion of loculated hydropneumothorax/impaired re-expansion. Patient currently stable and without chest pain, dyspnea or cough. She will return to radiology department on 08/03/2017 for follow-up chest x-ray. She was told to return to the ED should she experience increasing chest pain or worsening dyspnea. Read by: Rowe Robert, PA-C Electronically Signed   By: Markus Daft M.D.   On: 08/02/2017 16:31    ASSESSMENT AND PLAN:  This is a very pleasant 56 years old African-American female with metastatic non-small cell lung cancer that was initially diagnosed as  stage IIIa non-small cell lung cancer, squamous cell carcinoma presented with  large right hilar mass and mediastinal lymphadenopathy diagnosed in November 2016. She status post concurrent chemoradiation followed by consolidation chemotherapy. The patient was found recently to have intramuscular metastases and the right posterior chest wall that was biopsy-proven to be squamous cell carcinoma with PDL 1 expression 50%. The patient was a started on treatment with immunotherapy with Ketruda (pembrolizumab) status post 3 cycles. Unfortunately she had evidence for disease progression on the restaging scan. The patient is here today to start the first cycle of systemic chemotherapy with carboplatin and paclitaxel. She is feeling fine and will proceed with the first treatment as a scheduled. I will see her back for follow-up visit in 3 weeks for evaluation before starting cycle #2. The patient was advised to call immediately if she has any concerning symptoms in the interval. The patient voices understanding of current disease status and treatment options and is in agreement with the current care plan. All questions were answered. The patient knows to call the clinic with any problems, questions or concerns. We can certainly see the patient much sooner if necessary.  Disclaimer: This note was dictated with voice recognition software. Similar sounding words can inadvertently be transcribed and may not be corrected upon review.

## 2017-08-26 NOTE — Progress Notes (Signed)
Neulasta instructions reviewed with patient and patient verbalized understanding.

## 2017-08-31 ENCOUNTER — Ambulatory Visit: Payer: Medicare Other | Admitting: Urgent Care

## 2017-09-01 ENCOUNTER — Other Ambulatory Visit: Payer: Medicare Other

## 2017-09-02 ENCOUNTER — Ambulatory Visit (HOSPITAL_BASED_OUTPATIENT_CLINIC_OR_DEPARTMENT_OTHER): Payer: Medicare Other | Admitting: Medical

## 2017-09-02 ENCOUNTER — Ambulatory Visit: Payer: Medicare Other

## 2017-09-02 ENCOUNTER — Other Ambulatory Visit: Payer: Self-pay | Admitting: *Deleted

## 2017-09-02 ENCOUNTER — Telehealth: Payer: Self-pay | Admitting: Medical Oncology

## 2017-09-02 ENCOUNTER — Ambulatory Visit (HOSPITAL_BASED_OUTPATIENT_CLINIC_OR_DEPARTMENT_OTHER): Payer: Medicare Other

## 2017-09-02 VITALS — BP 121/76 | HR 116 | Temp 98.7°F | Resp 18

## 2017-09-02 DIAGNOSIS — R197 Diarrhea, unspecified: Secondary | ICD-10-CM | POA: Diagnosis not present

## 2017-09-02 DIAGNOSIS — R112 Nausea with vomiting, unspecified: Secondary | ICD-10-CM

## 2017-09-02 DIAGNOSIS — D701 Agranulocytosis secondary to cancer chemotherapy: Secondary | ICD-10-CM | POA: Diagnosis not present

## 2017-09-02 DIAGNOSIS — C3481 Malignant neoplasm of overlapping sites of right bronchus and lung: Secondary | ICD-10-CM | POA: Diagnosis not present

## 2017-09-02 LAB — CBC WITH DIFFERENTIAL/PLATELET
BASO%: 1.5 % (ref 0.0–2.0)
BASOS ABS: 0 10*3/uL (ref 0.0–0.1)
EOS ABS: 0 10*3/uL (ref 0.0–0.5)
EOS%: 1 % (ref 0.0–7.0)
HEMATOCRIT: 34.3 % — AB (ref 34.8–46.6)
HEMOGLOBIN: 11.2 g/dL — AB (ref 11.6–15.9)
LYMPH#: 0.4 10*3/uL — AB (ref 0.9–3.3)
LYMPH%: 40.9 % (ref 14.0–49.7)
MCH: 29.9 pg (ref 25.1–34.0)
MCHC: 32.5 g/dL (ref 31.5–36.0)
MCV: 91.8 fL (ref 79.5–101.0)
MONO#: 0.2 10*3/uL (ref 0.1–0.9)
MONO%: 23.5 % — ABNORMAL HIGH (ref 0.0–14.0)
NEUT#: 0.3 10*3/uL — CL (ref 1.5–6.5)
NEUT%: 33.1 % — AB (ref 38.4–76.8)
PLATELETS: 168 10*3/uL (ref 145–400)
RBC: 3.74 10*6/uL (ref 3.70–5.45)
RDW: 17.6 % — AB (ref 11.2–14.5)
WBC: 0.9 10*3/uL — CL (ref 3.9–10.3)

## 2017-09-02 LAB — COMPREHENSIVE METABOLIC PANEL
ALBUMIN: 3.3 g/dL — AB (ref 3.5–5.0)
ALK PHOS: 70 U/L (ref 40–150)
ALT: 11 U/L (ref 0–55)
ANION GAP: 13 meq/L — AB (ref 3–11)
AST: 29 U/L (ref 5–34)
BUN: 7.7 mg/dL (ref 7.0–26.0)
CALCIUM: 9.1 mg/dL (ref 8.4–10.4)
CHLORIDE: 98 meq/L (ref 98–109)
CO2: 22 mEq/L (ref 22–29)
Creatinine: 0.8 mg/dL (ref 0.6–1.1)
EGFR: >60 mL/min/{1.73_m2} (ref >60)
Glucose: 141 mg/dl — ABNORMAL HIGH (ref 70–140)
POTASSIUM: 3.5 meq/L (ref 3.5–5.1)
Sodium: 133 mEq/L — ABNORMAL LOW (ref 136–145)
Total Bilirubin: 0.91 mg/dL (ref 0.20–1.20)
Total Protein: 7.5 g/dL (ref 6.4–8.3)

## 2017-09-02 MED ORDER — DEXTROSE 5 % IV SOLN
1.0000 g | Freq: Once | INTRAVENOUS | Status: AC
  Administered 2017-09-02: 1 g via INTRAVENOUS
  Filled 2017-09-02: qty 10

## 2017-09-02 MED ORDER — SODIUM CHLORIDE 0.9 % IV SOLN
INTRAVENOUS | Status: DC
  Administered 2017-09-02: 12:00:00 via INTRAVENOUS

## 2017-09-02 MED ORDER — LEVOFLOXACIN 500 MG PO TABS: 500.0000 mg | ORAL_TABLET | Freq: Every day | ORAL | 0 refills | Status: DC

## 2017-09-02 MED ORDER — LORAZEPAM 0.5 MG PO TABS: 0.5000 mg | ORAL_TABLET | Freq: Four times a day (QID) | ORAL | 0 refills | Status: DC | PRN

## 2017-09-02 MED ORDER — ONDANSETRON 8 MG PO TBDP: 8.0000 mg | ORAL_TABLET | Freq: Three times a day (TID) | ORAL | 1 refills | Status: DC | PRN

## 2017-09-02 MED ORDER — SODIUM CHLORIDE 0.9% FLUSH
10.0000 mL | INTRAVENOUS | Status: DC | PRN
  Administered 2017-09-02: 10 mL
  Filled 2017-09-02: qty 10

## 2017-09-02 MED ORDER — LORAZEPAM 1 MG PO TABS
0.5000 mg | ORAL_TABLET | Freq: Once | ORAL | Status: AC
  Administered 2017-09-02: 0.5 mg via ORAL

## 2017-09-02 MED ORDER — LORAZEPAM 1 MG PO TABS
ORAL_TABLET | ORAL | Status: AC
  Filled 2017-09-02: qty 1

## 2017-09-02 MED ORDER — HEPARIN SOD (PORK) LOCK FLUSH 100 UNIT/ML IV SOLN
500.0000 [IU] | Freq: Once | INTRAVENOUS | Status: AC | PRN
  Administered 2017-09-02: 500 [IU]
  Filled 2017-09-02: qty 5

## 2017-09-02 MED ORDER — DIPHENOXYLATE-ATROPINE 2.5-0.025 MG PO TABS: 2.0000 | ORAL_TABLET | Freq: Four times a day (QID) | ORAL | 1 refills | Status: DC | PRN

## 2017-09-02 MED ORDER — SODIUM CHLORIDE 0.9 % IV SOLN
Freq: Once | INTRAVENOUS | Status: AC
  Administered 2017-09-02: 13:00:00 via INTRAVENOUS
  Filled 2017-09-02: qty 8

## 2017-09-02 MED ORDER — LORAZEPAM 0.5 MG PO TABS: ORAL_TABLET | ORAL | 0 refills | Status: DC

## 2017-09-02 NOTE — Progress Notes (Signed)
Nausea has eased significantly after lorazepam, zofran and decadron. Received 1.75 liters of normal saline. Able to eat 1/2 sandwich and some potato chips and gingerale.  VSS at end of visit.  Transported via w/c to lobby with pt's sister.

## 2017-09-02 NOTE — Patient Instructions (Signed)
Dehydration, Adult Dehydration is a condition in which there is not enough fluid or water in the body. This happens when you lose more fluids than you take in. Important organs, such as the kidneys, brain, and heart, cannot function without a proper amount of fluids. Any loss of fluids from the body can lead to dehydration. Dehydration can range from mild to severe. This condition should be treated right away to prevent it from becoming severe. What are the causes? This condition may be caused by:  Vomiting.  Diarrhea.  Excessive sweating, such as from heat exposure or exercise.  Not drinking enough fluid, especially: ? When ill. ? While doing activity that requires a lot of energy.  Excessive urination.  Fever.  Infection.  Certain medicines, such as medicines that cause the body to lose excess fluid (diuretics).  Inability to access safe drinking water.  Reduced physical ability to get adequate water and food.  What increases the risk? This condition is more likely to develop in people:  Who have a poorly controlled long-term (chronic) illness, such as diabetes, heart disease, or kidney disease.  Who are age 65 or older.  Who are disabled.  Who live in a place with high altitude.  Who play endurance sports.  What are the signs or symptoms? Symptoms of mild dehydration may include:  Thirst.  Dry lips.  Slightly dry mouth.  Dry, warm skin.  Dizziness. Symptoms of moderate dehydration may include:  Very dry mouth.  Muscle cramps.  Dark urine. Urine may be the color of tea.  Decreased urine production.  Decreased tear production.  Heartbeat that is irregular or faster than normal (palpitations).  Headache.  Light-headedness, especially when you stand up from a sitting position.  Fainting (syncope). Symptoms of severe dehydration may include:  Changes in skin, such as: ? Cold and clammy skin. ? Blotchy (mottled) or pale skin. ? Skin that does  not quickly return to normal after being lightly pinched and released (poor skin turgor).  Changes in body fluids, such as: ? Extreme thirst. ? No tear production. ? Inability to sweat when body temperature is high, such as in hot weather. ? Very little urine production.  Changes in vital signs, such as: ? Weak pulse. ? Pulse that is more than 100 beats a minute when sitting still. ? Rapid breathing. ? Low blood pressure.  Other changes, such as: ? Sunken eyes. ? Cold hands and feet. ? Confusion. ? Lack of energy (lethargy). ? Difficulty waking up from sleep. ? Short-term weight loss. ? Unconsciousness. How is this diagnosed? This condition is diagnosed based on your symptoms and a physical exam. Blood and urine tests may be done to help confirm the diagnosis. How is this treated? Treatment for this condition depends on the severity. Mild or moderate dehydration can often be treated at home. Treatment should be started right away. Do not wait until dehydration becomes severe. Severe dehydration is an emergency and it needs to be treated in a hospital. Treatment for mild dehydration may include:  Drinking more fluids.  Replacing salts and minerals in your blood (electrolytes) that you may have lost. Treatment for moderate dehydration may include:  Drinking an oral rehydration solution (ORS). This is a drink that helps you replace fluids and electrolytes (rehydrate). It can be found at pharmacies and retail stores. Treatment for severe dehydration may include:  Receiving fluids through an IV tube.  Receiving an electrolyte solution through a feeding tube that is passed through your nose   and into your stomach (nasogastric tube, or NG tube).  Correcting any abnormalities in electrolytes.  Treating the underlying cause of dehydration. Follow these instructions at home:  If directed by your health care provider, drink an ORS: ? Make an ORS by following instructions on the  package. ? Start by drinking small amounts, about  cup (120 mL) every 5-10 minutes. ? Slowly increase how much you drink until you have taken the amount recommended by your health care provider.  Drink enough clear fluid to keep your urine clear or pale yellow. If you were told to drink an ORS, finish the ORS first, then start slowly drinking other clear fluids. Drink fluids such as: ? Water. Do not drink only water. Doing that can lead to having too little salt (sodium) in the body (hyponatremia). ? Ice chips. ? Fruit juice that you have added water to (diluted fruit juice). ? Low-calorie sports drinks.  Avoid: ? Alcohol. ? Drinks that contain a lot of sugar. These include high-calorie sports drinks, fruit juice that is not diluted, and soda. ? Caffeine. ? Foods that are greasy or contain a lot of fat or sugar.  Take over-the-counter and prescription medicines only as told by your health care provider.  Do not take sodium tablets. This can lead to having too much sodium in the body (hypernatremia).  Eat foods that contain a healthy balance of electrolytes, such as bananas, oranges, potatoes, tomatoes, and spinach.  Keep all follow-up visits as told by your health care provider. This is important. Contact a health care provider if:  You have abdominal pain that: ? Gets worse. ? Stays in one area (localizes).  You have a rash.  You have a stiff neck.  You are more irritable than usual.  You are sleepier or more difficult to wake up than usual.  You feel weak or dizzy.  You feel very thirsty.  You have urinated only a small amount of very dark urine over 6-8 hours. Get help right away if:  You have symptoms of severe dehydration.  You cannot drink fluids without vomiting.  Your symptoms get worse with treatment.  You have a fever.  You have a severe headache.  You have vomiting or diarrhea that: ? Gets worse. ? Does not go away.  You have blood or green matter  (bile) in your vomit.  You have blood in your stool. This may cause stool to look black and tarry.  You have not urinated in 6-8 hours.  You faint.  Your heart rate while sitting still is over 100 beats a minute.  You have trouble breathing. This information is not intended to replace advice given to you by your health care provider. Make sure you discuss any questions you have with your health care provider. Document Released: 09/07/2005 Document Revised: 04/03/2016 Document Reviewed: 11/01/2015 Elsevier Interactive Patient Education  2018 [employer]  

## 2017-09-02 NOTE — Telephone Encounter (Signed)
Vomiting and hot all over (so hot she is nude) ,diarrhea since last treatment .Denies fever. appt with lab and Sandi Mealy.

## 2017-09-02 NOTE — Progress Notes (Signed)
[facility] Progress Note   Theresa [name] [id number] Feb 21, 1961 56 y.o.  Theresa [name] is managed by Dr. Eilleen Kempf  Actively treated with chemotherapy: yes  Current Therapy: Keytruda, carboplatin paclitaxel with Neulasta support  Last Treated: 08/26/2017  Assessment: Plan:    Nausea and vomiting, intractability of vomiting not specified, unspecified vomiting type - Plan: 0.9 %  sodium chloride infusion, ondansetron (ZOFRAN) 16 mg, dexamethasone (DECADRON) 10 mg in sodium chloride 0.9 % 50 mL IVPB, LORazepam (ATIVAN) tablet 0.5 mg, LORazepam (ATIVAN) 0.5 MG tablet  Diarrhea, unspecified type - Plan: Magnesium  Chemotherapy-induced neutropenia ([place]) - Plan: cefTRIAXone (ROCEPHIN) 1 g in dextrose 5 % 50 mL IVPB, levofloxacin (LEVAQUIN) 500 MG tablet  Malignant neoplasm of overlapping sites of right lung (HCC)   Nausea and vomiting: Patient was given Zofran 60 mg IV with Decadron 10 mg IV.  She was also given Ativan 0.5 mg sublingual.  She was given a prescription for Ativan 0.5 mg sublingual every 6 hours as needed for nausea.  Diarrhea: The patient was given a prescription for Lomotil 2.5-0.025 mg, 1-2 tablets p.o. 4 times daily as needed for diarrhea.  Chemotherapy-induced neutropenia: Patient was given Rocephin 1 g IV and was given a prescription for Levaquin 500 mg p.o. once daily times 7 days.  Metastatic malignant neoplasm of overlapping sites in the right lung: The patient is status post cycle 1 of Keytruda, carboplatin and paclitaxel.  She is scheduled to return on 09/15/2017 for consideration of her next cycle of chemotherapy.  Please see After Visit Summary for patient specific instructions.  Future Appointments  Date Time Provider [place]  09/08/2017 10:00 AM [facility] 4 [facility] None  09/08/2017 10:30 AM [facility] FLUSH NURSE [facility] None  09/15/2017  9:30 AM [facility] LAB 6 [facility] None  09/15/2017  9:45  AM [facility] A2 [facility] None  09/15/2017 10:15 AM Curt Bears, MD [facility] None  09/15/2017 11:00 AM [facility] F21 [facility] None  09/22/2017 12:30 PM [facility] ONLY [facility] None  09/22/2017  1:00 PM [facility] FLUSH NURSE 2 [facility] None  09/29/2017 11:30 AM [facility] LAB 1 [facility] None  09/29/2017 12:00 PM [facility] FLUSH NURSE [facility] None  10/05/2017  8:45 AM [facility] LAB 6 [facility] None  10/05/2017  9:00 AM [facility] INJ NURSE [facility] None  10/05/2017  9:30 AM Curcio, Roselie Awkward, NP [facility] None  10/05/2017 10:30 AM [facility] G23 [facility] None    Orders Placed This Encounter  Procedures  . Magnesium       Subjective:   Patient ID:  Theresa [name] is a 56 y.o. (DOB 08-Jun-1961) female.  Chief Complaint:  Chief Complaint  Patient presents with  . Nausea    and diarrhea    HPI Theresa [name] is a 56 year old female with a history of a metastatic non-small cell lung cancer originally diagnosed as a stage IIIa (T2b, N2, M0) she is given her some fluids non-small cell lung cancer, invasive poorly differentiated squamous cell carcinoma.  This was originally diagnosed in November 2016 when she presented with a large right hilar mass with collapse of the right middle lobe and extension into the right upper lobe and right lower lobe along the hilum with right paratracheal and subcarinal lymphadenopathy.  In July 2018 she presented with a metastatic intramuscular mass in the right posterior lateral chest wall.  A biopsy was completed which confirmed a squamous cell carcinoma.  Previously she had been treated with chemoradiation with weekly carboplatin and paclitaxel.  This was first dozed on 08/11/2015.  She received 5 cycles with a significant response.  She was given consolidation chemotherapy with carboplatin for AUC of 5 and paclitaxel 175 mg/m square every 3 weeks with Neulasta support.  This is first dosed in August 26, 2017.  She  presents to the office today with a week long history of intractable nausea and vomiting with diarrhea.  The patient reports that everything that she attempts to eat or drink comes back up.  She has anti-emetics but is been using them only intermittently.  These include Zofran she has Zofran which she has been using intermittently.  She has not gotten to the pharmacy to get Imodium as yet.  She has been having hot flashes but denies fevers, chills, shortness of breath, chest pain, or a cough.  Medications: I have reviewed the patient's current medications.  Allergies: No Known Allergies  Past Medical History:  Diagnosis Date  . Cancer ([place])    5 years ago - cervical cancer  . Encounter for antineoplastic chemotherapy 08/27/2015  . History of radiation therapy 08/19/2015 - 10/07/2015   Site/dose:   The patient's primary tumor and involved lymph nodes were treated to 66 Gy in 30 fractions.  . Hypokalemia 09/30/2015  . Non-small cell carcinoma of lung, stage 3 ([place]) 08/08/2015  . Paroxysmal atrial flutter ([place]) 09/16/2015  . Pneumonia     Past Surgical History:  Procedure Laterality Date  . IR FLUORO GUIDE PORT INSERTION RIGHT  05/21/2017  . IR US GUIDE VASC ACCESS RIGHT  05/21/2017  . TUBAL LIGATION    . VIDEO BRONCHOSCOPY WITH ENDOBRONCHIAL ULTRASOUND N/A 08/02/2015   Procedure: VIDEO BRONCHOSCOPY WITH ENDOBRONCHIAL ULTRASOUND;  Surgeon: [name], MD;  Location: [facility] OR;  Service: Thoracic;  Laterality: N/A;  . WISDOM TOOTH EXTRACTION      Family History  Problem Relation Age of Onset  . COPD Mother   . Diabetes type II Mother   . Heart disease Mother   . High Cholesterol Mother   . Lung cancer Father     Social History   Socioeconomic History  . Marital status: Single    Spouse name: Not on file  . Number of children: Not on file  . Years of education: Not on file  . Highest education level: Not on file  Social Needs  . Financial resource strain: Not on file  .  Food insecurity - worry: Not on file  . Food insecurity - inability: Not on file  . Transportation needs - medical: Not on file  . Transportation needs - non-medical: Not on file  Occupational History  . Not on file  Tobacco Use  . Smoking status: Former Smoker    Packs/day: 0.30    Years: 5.00    Pack years: 1.50    Types: Cigarettes    Last attempt to quit: 07/31/1990    Years since quitting: 27.1  . Smokeless tobacco: Never Used  Substance and Sexual Activity  . Alcohol use: No    Alcohol/week: 0.0 oz  . Drug use: No  . Sexual activity: Not Currently  Other Topics Concern  . Not on file  Social History Narrative  . Not on file    Past Medical History, Surgical history, Social history, and Family history were reviewed and updated as appropriate.   Please see review of systems for further details on the patient's review from today.   Review of Systems:  Review of Systems  Constitutional: Positive for appetite  change and fatigue. Negative for chills, diaphoresis and fever.  Respiratory: Negative for cough, chest tightness and shortness of breath.   Cardiovascular: Negative for chest pain, palpitations and leg swelling.  Gastrointestinal: Positive for diarrhea, nausea and vomiting. Negative for constipation.  Genitourinary: Negative for decreased urine volume and difficulty urinating.  Neurological: Negative for weakness.    Objective:   Physical Exam:  There were no vitals taken for this visit. ECOG: 1  Physical Exam  Constitutional: No distress.  HENT:  Head: Normocephalic and atraumatic.  Mouth/Throat: No oropharyngeal exudate.  Tongue appears dry  Cardiovascular: S1 normal and S2 normal. Tachycardia present.  No murmur heard. Pulmonary/Chest: Effort normal and breath sounds normal. No respiratory distress. She has no wheezes. She has no rales.  Abdominal: Soft. Bowel sounds are normal. She exhibits no distension. There is no tenderness. There is no rebound.    Neurological: She is alert.  Skin: Skin is warm and dry. She is not diaphoretic.    Lab Review:     Component Value Date/Time   NA 133 (L) 09/02/2017 1134   K 3.5 09/02/2017 1134   CL 99 (L) 04/19/2017 1207   CO2 22 09/02/2017 1134   GLUCOSE 141 (H) 09/02/2017 1134   BUN 7.7 09/02/2017 1134   CREATININE 0.8 09/02/2017 1134   CALCIUM 9.1 09/02/2017 1134   PROT 7.5 09/02/2017 1134   ALBUMIN 3.3 (L) 09/02/2017 1134   AST 29 09/02/2017 1134   ALT 11 09/02/2017 1134   ALKPHOS 70 09/02/2017 1134   BILITOT 0.91 09/02/2017 1134   GFRNONAA >60 04/19/2017 1207   GFRAA >60 04/19/2017 1207       Component Value Date/Time   WBC 0.9 (LL) 09/02/2017 1134   WBC 8.2 04/19/2017 1207   RBC 3.74 09/02/2017 1134   RBC 3.79 (L) 04/19/2017 1207   HGB 11.2 (L) 09/02/2017 1134   HCT 34.3 (L) 09/02/2017 1134   PLT 168 09/02/2017 1134   PLT 300 02/11/2017 1632   MCV 91.8 09/02/2017 1134   MCH 29.9 09/02/2017 1134   MCH 30.6 04/19/2017 1207   MCHC 32.5 09/02/2017 1134   MCHC 34.9 04/19/2017 1207   RDW 17.6 (H) 09/02/2017 1134   LYMPHSABS 0.4 (L) 09/02/2017 1134   MONOABS 0.2 09/02/2017 1134   EOSABS 0.0 09/02/2017 1134   BASOSABS 0.0 09/02/2017 1134   -------------------------------  Imaging from last 24 hours (if applicable):  Radiology interpretation: No results found.      This case was discussed with Dr. Julien Nordmann. He expressed agreement with my management of this patient.

## 2017-09-03 ENCOUNTER — Telehealth: Payer: Self-pay

## 2017-09-03 NOTE — Telephone Encounter (Signed)
Pt called to s/w Shauna Hugh, Diane is not available so she asked to s/w Sandi Mealy. She refused to give this RN a message. Forwarded request for phone call to [employer] PA.

## 2017-09-08 ENCOUNTER — Other Ambulatory Visit (HOSPITAL_BASED_OUTPATIENT_CLINIC_OR_DEPARTMENT_OTHER): Payer: Medicare Other

## 2017-09-08 ENCOUNTER — Telehealth: Payer: Self-pay | Admitting: Medical Oncology

## 2017-09-08 ENCOUNTER — Ambulatory Visit (HOSPITAL_BASED_OUTPATIENT_CLINIC_OR_DEPARTMENT_OTHER): Payer: Medicare Other

## 2017-09-08 ENCOUNTER — Other Ambulatory Visit: Payer: Self-pay | Admitting: *Deleted

## 2017-09-08 ENCOUNTER — Encounter: Payer: Self-pay | Admitting: *Deleted

## 2017-09-08 DIAGNOSIS — C3491 Malignant neoplasm of unspecified part of right bronchus or lung: Secondary | ICD-10-CM

## 2017-09-08 DIAGNOSIS — Z452 Encounter for adjustment and management of vascular access device: Secondary | ICD-10-CM | POA: Diagnosis not present

## 2017-09-08 DIAGNOSIS — E860 Dehydration: Secondary | ICD-10-CM

## 2017-09-08 DIAGNOSIS — C342 Malignant neoplasm of middle lobe, bronchus or lung: Secondary | ICD-10-CM

## 2017-09-08 LAB — COMPREHENSIVE METABOLIC PANEL
ALT: 10 U/L (ref 0–55)
ANION GAP: 11 meq/L (ref 3–11)
AST: 22 U/L (ref 5–34)
Albumin: 3.2 g/dL — ABNORMAL LOW (ref 3.5–5.0)
Alkaline Phosphatase: 92 U/L (ref 40–150)
BUN: 7.7 mg/dL (ref 7.0–26.0)
CALCIUM: 9.2 mg/dL (ref 8.4–10.4)
CHLORIDE: 103 meq/L (ref 98–109)
CO2: 26 meq/L (ref 22–29)
CREATININE: 0.8 mg/dL (ref 0.6–1.1)
Glucose: 129 mg/dl (ref 70–140)
POTASSIUM: 2.7 meq/L — AB (ref 3.5–5.1)
Sodium: 140 mEq/L (ref 136–145)
Total Bilirubin: 0.38 mg/dL (ref 0.20–1.20)
Total Protein: 7.1 g/dL (ref 6.4–8.3)

## 2017-09-08 LAB — CBC WITH DIFFERENTIAL/PLATELET
BASO%: 0.5 % (ref 0.0–2.0)
BASOS ABS: 0 10*3/uL (ref 0.0–0.1)
EOS ABS: 0 10*3/uL (ref 0.0–0.5)
EOS%: 0.3 % (ref 0.0–7.0)
HEMATOCRIT: 29.5 % — AB (ref 34.8–46.6)
HEMOGLOBIN: 9.8 g/dL — AB (ref 11.6–15.9)
LYMPH#: 0.5 10*3/uL — AB (ref 0.9–3.3)
LYMPH%: 8.3 % — ABNORMAL LOW (ref 14.0–49.7)
MCH: 30.8 pg (ref 25.1–34.0)
MCHC: 33.3 g/dL (ref 31.5–36.0)
MCV: 92.5 fL (ref 79.5–101.0)
MONO#: 0.4 10*3/uL (ref 0.1–0.9)
MONO%: 6.3 % (ref 0.0–14.0)
NEUT%: 84.6 % — ABNORMAL HIGH (ref 38.4–76.8)
NEUTROS ABS: 5 10*3/uL (ref 1.5–6.5)
Platelets: 158 10*3/uL (ref 145–400)
RBC: 3.19 10*6/uL — ABNORMAL LOW (ref 3.70–5.45)
RDW: 17.7 % — AB (ref 11.2–14.5)
WBC: 5.9 10*3/uL (ref 3.9–10.3)

## 2017-09-08 LAB — RESEARCH LABS

## 2017-09-08 MED ORDER — POTASSIUM CHLORIDE CRYS ER 20 MEQ PO TBCR: 40.0000 meq | EXTENDED_RELEASE_TABLET | Freq: Every day | ORAL | 0 refills | Status: DC

## 2017-09-08 MED ORDER — HEPARIN SOD (PORK) LOCK FLUSH 100 UNIT/ML IV SOLN
500.0000 [IU] | Freq: Once | INTRAVENOUS | Status: AC | PRN
  Administered 2017-09-08: 500 [IU]
  Filled 2017-09-08: qty 5

## 2017-09-08 MED ORDER — OXYCODONE HCL 5 MG PO TABS: 5.0000 mg | ORAL_TABLET | Freq: Four times a day (QID) | ORAL | 0 refills | Status: DC | PRN

## 2017-09-08 MED ORDER — SODIUM CHLORIDE 0.9% FLUSH
10.0000 mL | INTRAVENOUS | Status: DC | PRN
  Administered 2017-09-08: 10 mL
  Filled 2017-09-08: qty 10

## 2017-09-08 NOTE — Progress Notes (Signed)
Received notification via email from SWOG that complete genetic sequencing was not able to be completed at [facility] on the tumor block sent on 08/17/17 due to "unexpected laboratory failure". They were able to run the SP44 anti-C-met IHC assay and this returned as C-met negative. Not enough tissue left to run sequencing again. According to SWOG, MD can send another block if desired to repeat testing. Discussed this with Dr. Mohamed, who declines to submit another block at this time, since there are no current sub-studies she is eligible for. Sent inquiry to [facility] pathology department to determine if there is another block from the case [id number] for future submission. Research nurse will inform patient at next physician appointment on 09/15/17 about the testing issue. Susan L. Coward, RN, BSN Clinical Research Nurse 09/08/17 @ 1535 

## 2017-09-08 NOTE — Telephone Encounter (Signed)
Refill request sent to collab  nurse.

## 2017-09-08 NOTE — Telephone Encounter (Signed)
lmovm for pt notified K+ 2.7, instructions to pick up [place] at pharmacy, take 40 meq daily x 10 days. Request call back to confirm message received.

## 2017-09-09 ENCOUNTER — Telehealth: Payer: Self-pay | Admitting: *Deleted

## 2017-09-09 ENCOUNTER — Ambulatory Visit: Payer: Medicare Other | Admitting: Internal Medicine

## 2017-09-09 ENCOUNTER — Other Ambulatory Visit: Payer: Medicare Other

## 2017-09-09 ENCOUNTER — Ambulatory Visit: Payer: Medicare Other

## 2017-09-09 NOTE — Telephone Encounter (Signed)
Spoke with patient and informed her to the "unexpected laboratory failure" at [facility] and its impact on her molecular testing results. Did make her aware that she was negative for c-Met mutation and informed her that Dr. Julien Nordmann is aware and prefers to not attempt to re-submit a block until a sub-study that she is potentially eligible for opens up. She understands and appreciated the call. Mauri Reading Michaelle Copas Therapist, sports, BSN Clinical Research Nurse 09/09/17 @ [phone]

## 2017-09-11 ENCOUNTER — Encounter: Payer: Self-pay | Admitting: Pharmacist

## 2017-09-15 ENCOUNTER — Encounter: Payer: Self-pay | Admitting: Internal Medicine

## 2017-09-15 ENCOUNTER — Other Ambulatory Visit (HOSPITAL_BASED_OUTPATIENT_CLINIC_OR_DEPARTMENT_OTHER): Payer: Medicare Other

## 2017-09-15 ENCOUNTER — Ambulatory Visit (HOSPITAL_BASED_OUTPATIENT_CLINIC_OR_DEPARTMENT_OTHER): Payer: Medicare Other | Admitting: Internal Medicine

## 2017-09-15 ENCOUNTER — Other Ambulatory Visit: Payer: Medicare Other

## 2017-09-15 ENCOUNTER — Telehealth: Payer: Self-pay | Admitting: Internal Medicine

## 2017-09-15 ENCOUNTER — Ambulatory Visit (HOSPITAL_BASED_OUTPATIENT_CLINIC_OR_DEPARTMENT_OTHER): Payer: Medicare Other

## 2017-09-15 ENCOUNTER — Ambulatory Visit: Payer: Medicare Other

## 2017-09-15 VITALS — BP 121/79 | HR 120 | Temp 97.7°F | Resp 20 | Ht 68.0 in | Wt 205.7 lb

## 2017-09-15 DIAGNOSIS — C3481 Malignant neoplasm of overlapping sites of right bronchus and lung: Secondary | ICD-10-CM | POA: Diagnosis not present

## 2017-09-15 DIAGNOSIS — C3491 Malignant neoplasm of unspecified part of right bronchus or lung: Secondary | ICD-10-CM

## 2017-09-15 DIAGNOSIS — E860 Dehydration: Secondary | ICD-10-CM

## 2017-09-15 DIAGNOSIS — R5383 Other fatigue: Secondary | ICD-10-CM | POA: Diagnosis not present

## 2017-09-15 DIAGNOSIS — R000 Tachycardia, unspecified: Secondary | ICD-10-CM

## 2017-09-15 DIAGNOSIS — Z5111 Encounter for antineoplastic chemotherapy: Secondary | ICD-10-CM | POA: Diagnosis not present

## 2017-09-15 DIAGNOSIS — G620 Drug-induced polyneuropathy: Secondary | ICD-10-CM | POA: Diagnosis not present

## 2017-09-15 DIAGNOSIS — Z5189 Encounter for other specified aftercare: Secondary | ICD-10-CM

## 2017-09-15 DIAGNOSIS — I483 Typical atrial flutter: Secondary | ICD-10-CM

## 2017-09-15 DIAGNOSIS — R197 Diarrhea, unspecified: Secondary | ICD-10-CM

## 2017-09-15 LAB — COMPREHENSIVE METABOLIC PANEL
ALBUMIN: 3.3 g/dL — AB (ref 3.5–5.0)
ALT: 9 U/L (ref 0–55)
AST: 20 U/L (ref 5–34)
Alkaline Phosphatase: 78 U/L (ref 40–150)
Anion Gap: 10 mEq/L (ref 3–11)
BUN: 8.3 mg/dL (ref 7.0–26.0)
CHLORIDE: 106 meq/L (ref 98–109)
CO2: 25 meq/L (ref 22–29)
Calcium: 9.3 mg/dL (ref 8.4–10.4)
Creatinine: 0.8 mg/dL (ref 0.6–1.1)
EGFR: >60 mL/min/{1.73_m2} (ref >60)
GLUCOSE: 96 mg/dL (ref 70–140)
POTASSIUM: 3.7 meq/L (ref 3.5–5.1)
SODIUM: 141 meq/L (ref 136–145)
Total Bilirubin: 0.38 mg/dL (ref 0.20–1.20)
Total Protein: 7.3 g/dL (ref 6.4–8.3)

## 2017-09-15 LAB — CBC WITH DIFFERENTIAL/PLATELET
BASO%: 3.4 % — ABNORMAL HIGH (ref 0.0–2.0)
BASOS ABS: 0.1 10*3/uL (ref 0.0–0.1)
EOS ABS: 0 10*3/uL (ref 0.0–0.5)
EOS%: 1.2 % (ref 0.0–7.0)
HEMATOCRIT: 27.5 % — AB (ref 34.8–46.6)
HGB: 9.1 g/dL — ABNORMAL LOW (ref 11.6–15.9)
LYMPH#: 0.5 10*3/uL — AB (ref 0.9–3.3)
LYMPH%: 20.6 % (ref 14.0–49.7)
MCH: 30.4 pg (ref 25.1–34.0)
MCHC: 33 g/dL (ref 31.5–36.0)
MCV: 92.2 fL (ref 79.5–101.0)
MONO#: 0.4 10*3/uL (ref 0.1–0.9)
MONO%: 15.9 % — ABNORMAL HIGH (ref 0.0–14.0)
NEUT#: 1.3 10*3/uL — ABNORMAL LOW (ref 1.5–6.5)
NEUT%: 58.9 % (ref 38.4–76.8)
PLATELETS: 170 10*3/uL (ref 145–400)
RBC: 2.98 10*6/uL — AB (ref 3.70–5.45)
RDW: 18.1 % — ABNORMAL HIGH (ref 11.2–14.5)
WBC: 2.2 10*3/uL — ABNORMAL LOW (ref 3.9–10.3)

## 2017-09-15 LAB — MAGNESIUM: MAGNESIUM: 1.7 mg/dL (ref 1.5–2.5)

## 2017-09-15 MED ORDER — CARBOPLATIN CHEMO INJECTION 600 MG/60ML
700.0000 mg | Freq: Once | INTRAVENOUS | Status: AC
  Administered 2017-09-15: 700 mg via INTRAVENOUS
  Filled 2017-09-15: qty 70

## 2017-09-15 MED ORDER — SODIUM CHLORIDE 0.9% FLUSH
10.0000 mL | INTRAVENOUS | Status: DC | PRN
  Administered 2017-09-15: 10 mL
  Filled 2017-09-15: qty 10

## 2017-09-15 MED ORDER — GABAPENTIN 100 MG PO CAPS: 300.0000 mg | ORAL_CAPSULE | Freq: Three times a day (TID) | ORAL | 2 refills | Status: DC

## 2017-09-15 MED ORDER — PALONOSETRON HCL INJECTION 0.25 MG/5ML
INTRAVENOUS | Status: AC
  Filled 2017-09-15: qty 5

## 2017-09-15 MED ORDER — FAMOTIDINE IN NACL 20-0.9 MG/50ML-% IV SOLN
INTRAVENOUS | Status: AC
  Filled 2017-09-15: qty 50

## 2017-09-15 MED ORDER — HEPARIN SOD (PORK) LOCK FLUSH 100 UNIT/ML IV SOLN
500.0000 [IU] | Freq: Once | INTRAVENOUS | Status: AC | PRN
  Administered 2017-09-15: 500 [IU]
  Filled 2017-09-15: qty 5

## 2017-09-15 MED ORDER — PALONOSETRON HCL INJECTION 0.25 MG/5ML
0.2500 mg | Freq: Once | INTRAVENOUS | Status: AC
  Administered 2017-09-15: 0.25 mg via INTRAVENOUS

## 2017-09-15 MED ORDER — PEGFILGRASTIM 6 MG/0.6ML ~~LOC~~ PSKT
6.0000 mg | PREFILLED_SYRINGE | Freq: Once | SUBCUTANEOUS | Status: AC
  Administered 2017-09-15: 6 mg via SUBCUTANEOUS

## 2017-09-15 MED ORDER — DIPHENHYDRAMINE HCL 50 MG/ML IJ SOLN
INTRAMUSCULAR | Status: AC
  Filled 2017-09-15: qty 1

## 2017-09-15 MED ORDER — PEGFILGRASTIM 6 MG/0.6ML ~~LOC~~ PSKT
PREFILLED_SYRINGE | SUBCUTANEOUS | Status: AC
  Filled 2017-09-15: qty 0.6

## 2017-09-15 MED ORDER — DIPHENHYDRAMINE HCL 50 MG/ML IJ SOLN
50.0000 mg | Freq: Once | INTRAMUSCULAR | Status: AC
  Administered 2017-09-15: 50 mg via INTRAVENOUS

## 2017-09-15 MED ORDER — SODIUM CHLORIDE 0.9 % IV SOLN
150.0000 mg/m2 | Freq: Once | INTRAVENOUS | Status: AC
  Administered 2017-09-15: 318 mg via INTRAVENOUS
  Filled 2017-09-15: qty 53

## 2017-09-15 MED ORDER — SODIUM CHLORIDE 0.9 % IV SOLN
Freq: Once | INTRAVENOUS | Status: AC
  Administered 2017-09-15: 11:00:00 via INTRAVENOUS

## 2017-09-15 MED ORDER — SODIUM CHLORIDE 0.9 % IV SOLN
Freq: Once | INTRAVENOUS | Status: AC
  Administered 2017-09-15: 12:00:00 via INTRAVENOUS
  Filled 2017-09-15: qty 5

## 2017-09-15 MED ORDER — FAMOTIDINE IN NACL 20-0.9 MG/50ML-% IV SOLN
20.0000 mg | Freq: Once | INTRAVENOUS | Status: AC
  Administered 2017-09-15: 20 mg via INTRAVENOUS

## 2017-09-15 NOTE — Patient Instructions (Signed)
   [facility] Discharge Instructions for Patients Receiving Chemotherapy  Today you received the following chemotherapy agents Taxol and Carboplatin   To help prevent nausea and vomiting after your treatment, we encourage you to take your nausea medication as directed.    If you develop nausea and vomiting that is not controlled by your nausea medication, call the clinic.   BELOW ARE SYMPTOMS THAT SHOULD BE REPORTED IMMEDIATELY:  *FEVER GREATER THAN 100.5 F  *CHILLS WITH OR WITHOUT FEVER  NAUSEA AND VOMITING THAT IS NOT CONTROLLED WITH YOUR NAUSEA MEDICATION  *UNUSUAL SHORTNESS OF BREATH  *UNUSUAL BRUISING OR BLEEDING  TENDERNESS IN MOUTH AND THROAT WITH OR WITHOUT PRESENCE OF ULCERS  *URINARY PROBLEMS  *BOWEL PROBLEMS  UNUSUAL RASH Items with * indicate a potential emergency and should be followed up as soon as possible.  Feel free to call the clinic should you have any questions or concerns. The clinic phone number is [phone].  Please show the CHEMO ALERT CARD at check-in to the Emergency Department and triage nurse.   

## 2017-09-15 NOTE — Progress Notes (Signed)
Per Dr. Julien Nordmann, ok to treat with current vitals and lab results.

## 2017-09-15 NOTE — Telephone Encounter (Signed)
Scheduled appt per 12/26 los-   Patient to get an updated schedule in the treatment area.

## 2017-09-15 NOTE — Progress Notes (Signed)
[facility] Telephone:([phone]   [phone]  OFFICE PROGRESS NOTE  [name], PA-C [place] [address]  DIAGNOSIS: Metastatic non-small cell lung cancer initially diagnosed as Stage IIIA (T2b, N2, M0) non-small cell lung cancer, invasive poorly differentiated squamous cell carcinoma diagnosed in November 2016 and presented with large right hilar mass with collapse of the right middle lobe and extension into the right upper lobe and right lower lobe along the hilum with right paratracheal and subcarinal lymphadenopathy. She presented in July 2018 with metastatic intramuscular mass in the right posterior lateral chest wall, with biopsy confirmed squamous cell carcinoma. PDL1 Expression: 50%.  PRIOR THERAPY:  1) Concurrent chemoradiation with weekly carboplatin for AUC of 2 and paclitaxel 45 MG/M2. First dose 08/19/2015. Status post 5 cycles. Last cycle was given 10/07/2015 with significant response. 2) Consolidation chemotherapy with carboplatin for AUC of 5 and paclitaxel 175 MG/M2 every 3 weeks with Neulasta support. First dose 11/18/2015. Status post 3 cycles. 3) palliative radiotherapy to the intramuscular mass in the right posterior lateral chest wall under the care of Dr. Tammi Klippel. 4) Ketruda 200 mg IV every 3 weeks. First dose 05/12/2017. Status post 3 cycles.  This was discontinued secondary to disease progression.  CURRENT THERAPY: Systemic chemotherapy with carboplatin for AC of 5 and paclitaxel 175 mg/M2 every 3 weeks with Neulasta support.  First dose August 26, 2017.  Status post 1 cycle.   INTERVAL HISTORY: Theresa [name] 56 y.o. female returns to the clinic today for follow-up visit.  The patient is feeling fine today with no specific complaints except for fatigue and neuropathy after the first cycle of her treatment.  She denied having any current chest pain, shortness of breath, cough or hemoptysis.  She denied having any fever  or chills.  She has no nausea, vomiting, diarrhea or constipation.  She has no weight loss or night sweats.  She is here today for evaluation before starting cycle #2.   MEDICAL HISTORY: Past Medical History:  Diagnosis Date  . Cancer ([place])    5 years ago - cervical cancer  . Encounter for antineoplastic chemotherapy 08/27/2015  . History of radiation therapy 08/19/2015 - 10/07/2015   Site/dose:   The patient's primary tumor and involved lymph nodes were treated to 66 Gy in 30 fractions.  . Hypokalemia 09/30/2015  . Non-small cell carcinoma of lung, stage 3 ([place]) 08/08/2015  . Paroxysmal atrial flutter ([place]) 09/16/2015  . Pneumonia     ALLERGIES:  has No Known Allergies.  MEDICATIONS:  Current Outpatient Medications  Medication Sig Dispense Refill  . docusate sodium (COLACE) 100 MG capsule Take 1 capsule (100 mg total) by mouth 2 (two) times daily. 60 capsule 1  . lidocaine-prilocaine (EMLA) cream Apply 1 application topically as needed. 30 g 2  . linaclotide (LINZESS) 145 MCG CAPS capsule Take 145 mcg by mouth daily before breakfast.    . LORazepam (ATIVAN) 0.5 MG tablet 0.5 mg sublingual p.o. every 6 hours as needed for nausea and/or vomiting. 40 tablet 0  . Multiple Vitamin (MULTIVITAMIN) capsule Take 1 capsule daily by mouth. 30 capsule 2  . ondansetron (ZOFRAN-ODT) 8 MG disintegrating tablet Take 1 tablet (8 mg total) by mouth every 8 (eight) hours as needed for nausea or vomiting. 20 tablet 1  . oxyCODONE (OXY IR/ROXICODONE) 5 MG immediate release tablet Take 1 tablet (5 mg total) by mouth every 6 (six) hours as needed for severe pain. 60 tablet  0  . potassium chloride SA (K-DUR,KLOR-CON) 20 MEQ tablet Take 2 tablets (40 mEq total) by mouth daily for 10 days. 20 tablet 0  . diphenoxylate-atropine (LOMOTIL) 2.5-0.025 MG tablet Take 2 tablets by mouth 4 (four) times daily as needed for diarrhea or loose stools. (Patient not taking: Reported on 09/15/2017) 60 tablet 1  . fentaNYL  (DURAGESIC - DOSED MCG/HR) 50 MCG/HR Place 1 patch (50 mcg total) onto the skin every 3 (three) days. (Patient not taking: Reported on 08/26/2017) 10 patch 0  . lactulose (CHRONULAC) 10 GM/15ML solution Take 15 mLs (10 g total) by mouth 3 (three) times daily as needed for mild constipation. (Patient not taking: Reported on 09/02/2017) 240 mL 3  . levofloxacin (LEVAQUIN) 500 MG tablet Take 1 tablet (500 mg total) by mouth daily. 7 tablet 0  . magnesium hydroxide (MILK OF MAGNESIA) 400 MG/5ML suspension Take 5 mLs by mouth at bedtime as needed for mild constipation.    . prochlorperazine (COMPAZINE) 10 MG tablet Take 1 tablet (10 mg total) by mouth every 6 (six) hours as needed for nausea or vomiting. (Patient not taking: Reported on 08/26/2017) 30 tablet 1   No current facility-administered medications for this visit.    Facility-Administered Medications Ordered in Other Visits  Medication Dose Route Frequency Provider Last Rate Last Dose  . 0.9 %  sodium chloride infusion   Intravenous Continuous Curcio, Kristin R, NP      . 0.9 %  sodium chloride infusion   Intravenous Continuous Curt Bears, MD   Stopped at 05/31/17 1714  . ondansetron (ZOFRAN) injection 8 mg  8 mg Intravenous Once Curt Bears, MD        SURGICAL HISTORY:  Past Surgical History:  Procedure Laterality Date  . IR FLUORO GUIDE PORT INSERTION RIGHT  05/21/2017  . IR US GUIDE VASC ACCESS RIGHT  05/21/2017  . TUBAL LIGATION    . VIDEO BRONCHOSCOPY WITH ENDOBRONCHIAL ULTRASOUND N/A 08/02/2015   Procedure: VIDEO BRONCHOSCOPY WITH ENDOBRONCHIAL ULTRASOUND;  Surgeon: [name], MD;  Location: [facility];  Service: Thoracic;  Laterality: N/A;  . WISDOM TOOTH EXTRACTION      REVIEW OF SYSTEMS:  A comprehensive review of systems was negative except for: Constitutional: positive for fatigue Neurological: positive for paresthesia   PHYSICAL EXAMINATION: General appearance: alert, cooperative, fatigued and no  distress Head: Normocephalic, without obvious abnormality, atraumatic Neck: no adenopathy, no JVD, supple, symmetrical, trachea midline and thyroid not enlarged, symmetric, no tenderness/mass/nodules Lymph nodes: Cervical, supraclavicular, and axillary nodes normal. Resp: clear to auscultation bilaterally Back: symmetric, no curvature. ROM normal. No CVA tenderness. Cardio: regular rate and rhythm, S1, S2 normal, no murmur, click, rub or gallop GI: soft, non-tender; bowel sounds normal; no masses,  no organomegaly Extremities: extremities normal, atraumatic, no cyanosis or edema  Examination of the back showed a palpable mass above the right scapula.  ECOG PERFORMANCE STATUS: 1 - Symptomatic but completely ambulatory  Blood pressure 121/79, pulse (!) 120, temperature 97.7 F (36.5 C), temperature source Oral, resp. rate 20, height '5\' 8"'  (1.727 m), weight 205 lb 11.2 oz (93.3 kg), SpO2 98 %.  LABORATORY DATA: Lab Results  Component Value Date   WBC 2.2 (L) 09/15/2017   HGB 9.1 (L) 09/15/2017   HCT 27.5 (L) 09/15/2017   MCV 92.2 09/15/2017   PLT 170 09/15/2017      Chemistry      Component Value Date/Time   NA 141 09/15/2017 0946   K 3.7 09/15/2017 0946  CL 99 (L) 04/19/2017 1207   CO2 25 09/15/2017 0946   BUN 8.3 09/15/2017 0946   CREATININE 0.8 09/15/2017 0946      Component Value Date/Time   CALCIUM 9.3 09/15/2017 0946   ALKPHOS 78 09/15/2017 0946   AST 20 09/15/2017 0946   ALT 9 09/15/2017 0946   BILITOT 0.38 09/15/2017 0946       RADIOGRAPHIC STUDIES: No results found.  ASSESSMENT AND PLAN:  This is a very pleasant 56 years old African-American female with metastatic non-small cell lung cancer that was initially diagnosed as stage IIIa non-small cell lung cancer, squamous cell carcinoma presented with large right hilar mass and mediastinal lymphadenopathy diagnosed in November 2016. She status post concurrent chemoradiation followed by consolidation  chemotherapy. The patient was found recently to have intramuscular metastases and the right posterior chest wall that was biopsy-proven to be squamous cell carcinoma with PDL 1 expression 50%. The patient was a started on treatment with immunotherapy with Ketruda (pembrolizumab) status post 3 cycles. Unfortunately she had evidence for disease progression on the restaging scan. The patient is currently undergoing treatment with carboplatin for [facility] of 5 and paclitaxel 175 mg/M2 status post 1 cycle.  She tolerated the first cycle fairly well except for fatigue and aching pain after Neulasta injection as well as peripheral neuropathy. I recommended for the patient to continue with her current treatment with the same regimen but I would reduce the dose of paclitaxel to 150 mg/M2 every 3 weeks with carboplatin. She will start cycle #2 today. For the peripheral neuropathy, I will start the patient on Neurontin 100 mg p.o. 3 times daily. She will come back for follow-up visit in 3 weeks for evaluation before starting cycle #3. The patient was advised to call immediately if she has any concerning symptoms in the interval. The patient voices understanding of current disease status and treatment options and is in agreement with the current care plan. All questions were answered. The patient knows to call the clinic with any problems, questions or concerns. We can certainly see the patient much sooner if necessary.  Disclaimer: This note was dictated with voice recognition software. Similar sounding words can inadvertently be transcribed and may not be corrected upon review.

## 2017-09-22 ENCOUNTER — Other Ambulatory Visit: Payer: Medicare Other

## 2017-09-24 ENCOUNTER — Ambulatory Visit (HOSPITAL_BASED_OUTPATIENT_CLINIC_OR_DEPARTMENT_OTHER): Payer: Medicare Other

## 2017-09-24 ENCOUNTER — Other Ambulatory Visit (HOSPITAL_BASED_OUTPATIENT_CLINIC_OR_DEPARTMENT_OTHER): Payer: Medicare Other

## 2017-09-24 DIAGNOSIS — C3481 Malignant neoplasm of overlapping sites of right bronchus and lung: Secondary | ICD-10-CM | POA: Diagnosis not present

## 2017-09-24 DIAGNOSIS — E860 Dehydration: Secondary | ICD-10-CM

## 2017-09-24 DIAGNOSIS — C3491 Malignant neoplasm of unspecified part of right bronchus or lung: Secondary | ICD-10-CM

## 2017-09-24 DIAGNOSIS — Z452 Encounter for adjustment and management of vascular access device: Secondary | ICD-10-CM

## 2017-09-24 LAB — CBC WITH DIFFERENTIAL/PLATELET
BASO%: 0.3 % (ref 0.0–2.0)
BASOS ABS: 0 10*3/uL (ref 0.0–0.1)
EOS%: 0.3 % (ref 0.0–7.0)
Eosinophils Absolute: 0 10*3/uL (ref 0.0–0.5)
HEMATOCRIT: 24.5 % — AB (ref 34.8–46.6)
HEMOGLOBIN: 8.3 g/dL — AB (ref 11.6–15.9)
LYMPH%: 26.1 % (ref 14.0–49.7)
MCH: 30.7 pg (ref 25.1–34.0)
MCHC: 33.9 g/dL (ref 31.5–36.0)
MCV: 90.7 fL (ref 79.5–101.0)
MONO#: 0.2 10*3/uL (ref 0.1–0.9)
MONO%: 8 % (ref 0.0–14.0)
NEUT#: 1.9 10*3/uL (ref 1.5–6.5)
NEUT%: 65.3 % (ref 38.4–76.8)
Platelets: 92 10*3/uL — ABNORMAL LOW (ref 145–400)
RBC: 2.7 10*6/uL — ABNORMAL LOW (ref 3.70–5.45)
RDW: 18.7 % — AB (ref 11.2–14.5)
WBC: 2.9 10*3/uL — ABNORMAL LOW (ref 3.9–10.3)
lymph#: 0.8 10*3/uL — ABNORMAL LOW (ref 0.9–3.3)

## 2017-09-24 LAB — COMPREHENSIVE METABOLIC PANEL
ALBUMIN: 3.5 g/dL (ref 3.5–5.0)
ALK PHOS: 92 U/L (ref 40–150)
ALT: 24 U/L (ref 0–55)
AST: 31 U/L (ref 5–34)
Anion Gap: 7 mEq/L (ref 3–11)
BUN: 9.5 mg/dL (ref 7.0–26.0)
CALCIUM: 9 mg/dL (ref 8.4–10.4)
CHLORIDE: 106 meq/L (ref 98–109)
CO2: 25 mEq/L (ref 22–29)
CREATININE: 0.8 mg/dL (ref 0.6–1.1)
EGFR: >60 mL/min/{1.73_m2} (ref >60)
GLUCOSE: 125 mg/dL (ref 70–140)
POTASSIUM: 3.4 meq/L — AB (ref 3.5–5.1)
SODIUM: 138 meq/L (ref 136–145)
Total Bilirubin: 0.4 mg/dL (ref 0.20–1.20)
Total Protein: 7.4 g/dL (ref 6.4–8.3)

## 2017-09-24 MED ORDER — SODIUM CHLORIDE 0.9% FLUSH
10.0000 mL | INTRAVENOUS | Status: DC | PRN
  Administered 2017-09-24: 10 mL
  Filled 2017-09-24: qty 10

## 2017-09-24 MED ORDER — HEPARIN SOD (PORK) LOCK FLUSH 100 UNIT/ML IV SOLN
500.0000 [IU] | Freq: Once | INTRAVENOUS | Status: AC | PRN
  Administered 2017-09-24: 500 [IU]
  Filled 2017-09-24: qty 5

## 2017-09-29 ENCOUNTER — Inpatient Hospital Stay: Payer: Medicare Other

## 2017-09-29 ENCOUNTER — Inpatient Hospital Stay: Payer: Medicare Other | Attending: Internal Medicine

## 2017-09-29 ENCOUNTER — Other Ambulatory Visit: Payer: Self-pay | Admitting: Oncology

## 2017-09-29 DIAGNOSIS — C7989 Secondary malignant neoplasm of other specified sites: Secondary | ICD-10-CM | POA: Diagnosis not present

## 2017-09-29 DIAGNOSIS — T451X5D Adverse effect of antineoplastic and immunosuppressive drugs, subsequent encounter: Secondary | ICD-10-CM | POA: Insufficient documentation

## 2017-09-29 DIAGNOSIS — G620 Drug-induced polyneuropathy: Secondary | ICD-10-CM | POA: Insufficient documentation

## 2017-09-29 DIAGNOSIS — Z5189 Encounter for other specified aftercare: Secondary | ICD-10-CM | POA: Diagnosis not present

## 2017-09-29 DIAGNOSIS — C3481 Malignant neoplasm of overlapping sites of right bronchus and lung: Secondary | ICD-10-CM | POA: Diagnosis not present

## 2017-09-29 DIAGNOSIS — D649 Anemia, unspecified: Secondary | ICD-10-CM | POA: Diagnosis not present

## 2017-09-29 DIAGNOSIS — C3491 Malignant neoplasm of unspecified part of right bronchus or lung: Secondary | ICD-10-CM

## 2017-09-29 DIAGNOSIS — E860 Dehydration: Secondary | ICD-10-CM

## 2017-09-29 DIAGNOSIS — Z5111 Encounter for antineoplastic chemotherapy: Secondary | ICD-10-CM | POA: Insufficient documentation

## 2017-09-29 DIAGNOSIS — Z452 Encounter for adjustment and management of vascular access device: Secondary | ICD-10-CM | POA: Diagnosis not present

## 2017-09-29 LAB — CBC WITH DIFFERENTIAL/PLATELET
Abs Granulocyte: 2.2 10*3/uL (ref 1.5–6.5)
Basophils Absolute: 0 10*3/uL (ref 0.0–0.1)
Basophils Relative: 0 %
EOS ABS: 0 10*3/uL (ref 0.0–0.5)
EOS PCT: 0 %
HCT: 25.6 % — ABNORMAL LOW (ref 34.8–46.6)
Hemoglobin: 8.5 g/dL — ABNORMAL LOW (ref 11.6–15.9)
LYMPHS PCT: 15 %
Lymphs Abs: 0.5 10*3/uL — ABNORMAL LOW (ref 0.9–3.3)
MCH: 31.3 pg (ref 25.1–34.0)
MCHC: 33.2 g/dL (ref 31.5–36.0)
MCV: 94.2 fL (ref 79.5–101.0)
MONO ABS: 0.3 10*3/uL (ref 0.1–0.9)
Monocytes Relative: 10 %
Neutro Abs: 2.2 10*3/uL (ref 1.5–6.5)
Neutrophils Relative %: 75 %
PLATELETS: 123 10*3/uL — AB (ref 145–400)
RBC: 2.72 MIL/uL — AB (ref 3.70–5.45)
RDW: 20.4 % — AB (ref 11.2–16.1)
WBC: 3 10*3/uL — AB (ref 3.9–10.3)

## 2017-09-29 LAB — COMPREHENSIVE METABOLIC PANEL
ALK PHOS: 96 U/L (ref 40–150)
ALT: 16 U/L (ref 0–55)
AST: 19 U/L (ref 5–34)
Albumin: 3.5 g/dL (ref 3.5–5.0)
Anion gap: 7 (ref 3–11)
BILIRUBIN TOTAL: 0.5 mg/dL (ref 0.2–1.2)
BUN: 7 mg/dL (ref 7–26)
CALCIUM: 9.2 mg/dL (ref 8.4–10.4)
CHLORIDE: 108 mmol/L (ref 98–109)
CO2: 26 mmol/L (ref 22–29)
CREATININE: 0.83 mg/dL (ref 0.60–1.10)
Glucose, Bld: 116 mg/dL (ref 70–140)
Potassium: 3 mmol/L — CL (ref 3.3–4.7)
Sodium: 141 mmol/L (ref 136–145)
TOTAL PROTEIN: 7.5 g/dL (ref 6.4–8.3)

## 2017-09-29 MED ORDER — HEPARIN SOD (PORK) LOCK FLUSH 100 UNIT/ML IV SOLN
500.0000 [IU] | Freq: Once | INTRAVENOUS | Status: AC | PRN
  Administered 2017-09-29: 500 [IU]
  Filled 2017-09-29: qty 5

## 2017-09-29 MED ORDER — SODIUM CHLORIDE 0.9% FLUSH
10.0000 mL | INTRAVENOUS | Status: DC | PRN
  Administered 2017-09-29: 10 mL
  Filled 2017-09-29: qty 10

## 2017-09-29 MED ORDER — POTASSIUM CHLORIDE CRYS ER 20 MEQ PO TBCR: EXTENDED_RELEASE_TABLET | ORAL | 0 refills | Status: DC

## 2017-09-30 ENCOUNTER — Other Ambulatory Visit: Payer: Self-pay | Admitting: Medical Oncology

## 2017-09-30 ENCOUNTER — Other Ambulatory Visit: Payer: Medicare Other

## 2017-09-30 ENCOUNTER — Ambulatory Visit: Payer: Medicare Other

## 2017-09-30 ENCOUNTER — Ambulatory Visit: Payer: Medicare Other | Admitting: Internal Medicine

## 2017-09-30 DIAGNOSIS — C3491 Malignant neoplasm of unspecified part of right bronchus or lung: Secondary | ICD-10-CM

## 2017-09-30 MED ORDER — OXYCODONE HCL 5 MG PO TABS: 5.0000 mg | ORAL_TABLET | Freq: Four times a day (QID) | ORAL | 0 refills | Status: DC | PRN

## 2017-10-05 ENCOUNTER — Inpatient Hospital Stay: Payer: Medicare Other

## 2017-10-05 ENCOUNTER — Encounter: Payer: Self-pay | Admitting: Oncology

## 2017-10-05 ENCOUNTER — Inpatient Hospital Stay (HOSPITAL_BASED_OUTPATIENT_CLINIC_OR_DEPARTMENT_OTHER): Payer: Medicare Other | Admitting: Oncology

## 2017-10-05 VITALS — BP 108/73 | HR 108 | Temp 98.1°F | Resp 19 | Ht 68.0 in | Wt 205.4 lb

## 2017-10-05 DIAGNOSIS — C3491 Malignant neoplasm of unspecified part of right bronchus or lung: Secondary | ICD-10-CM

## 2017-10-05 DIAGNOSIS — Z5111 Encounter for antineoplastic chemotherapy: Secondary | ICD-10-CM

## 2017-10-05 DIAGNOSIS — Z95828 Presence of other vascular implants and grafts: Secondary | ICD-10-CM

## 2017-10-05 DIAGNOSIS — Z5189 Encounter for other specified aftercare: Secondary | ICD-10-CM | POA: Diagnosis not present

## 2017-10-05 DIAGNOSIS — C3481 Malignant neoplasm of overlapping sites of right bronchus and lung: Secondary | ICD-10-CM | POA: Diagnosis not present

## 2017-10-05 DIAGNOSIS — C7989 Secondary malignant neoplasm of other specified sites: Secondary | ICD-10-CM | POA: Diagnosis not present

## 2017-10-05 DIAGNOSIS — G620 Drug-induced polyneuropathy: Secondary | ICD-10-CM

## 2017-10-05 DIAGNOSIS — Z452 Encounter for adjustment and management of vascular access device: Secondary | ICD-10-CM | POA: Diagnosis not present

## 2017-10-05 LAB — CBC WITH DIFFERENTIAL/PLATELET
Basophils Absolute: 0 10*3/uL (ref 0.0–0.1)
Basophils Relative: 0 %
EOS PCT: 0 %
Eosinophils Absolute: 0 10*3/uL (ref 0.0–0.5)
HCT: 27.4 % — ABNORMAL LOW (ref 34.8–46.6)
HEMOGLOBIN: 9.1 g/dL — AB (ref 11.6–15.9)
LYMPHS PCT: 12 %
Lymphs Abs: 0.4 10*3/uL — ABNORMAL LOW (ref 0.9–3.3)
MCH: 31.5 pg (ref 25.1–34.0)
MCHC: 33.1 g/dL (ref 31.5–36.0)
MCV: 95.2 fL (ref 79.5–101.0)
MONOS PCT: 12 %
Monocytes Absolute: 0.4 10*3/uL (ref 0.1–0.9)
NEUTROS PCT: 76 %
Neutro Abs: 2.3 10*3/uL (ref 1.5–6.5)
Platelets: 158 10*3/uL (ref 145–400)
RBC: 2.88 MIL/uL — AB (ref 3.70–5.45)
RDW: 22.8 % — ABNORMAL HIGH (ref 11.2–16.1)
WBC: 3.1 10*3/uL — ABNORMAL LOW (ref 3.9–10.3)

## 2017-10-05 LAB — COMPREHENSIVE METABOLIC PANEL
ALBUMIN: 3.6 g/dL (ref 3.5–5.0)
ALT: 12 U/L (ref 0–55)
AST: 16 U/L (ref 5–34)
Alkaline Phosphatase: 95 U/L (ref 40–150)
Anion gap: 11 (ref 3–11)
BILIRUBIN TOTAL: 0.5 mg/dL (ref 0.2–1.2)
BUN: 8 mg/dL (ref 7–26)
CHLORIDE: 106 mmol/L (ref 98–109)
CO2: 23 mmol/L (ref 22–29)
Calcium: 9.7 mg/dL (ref 8.4–10.4)
Creatinine, Ser: 0.83 mg/dL (ref 0.60–1.10)
GFR calc Af Amer: >60 mL/min (ref >60)
GFR calc non Af Amer: >60 mL/min (ref >60)
GLUCOSE: 120 mg/dL (ref 70–140)
POTASSIUM: 3.3 mmol/L (ref 3.3–4.7)
SODIUM: 140 mmol/L (ref 136–145)
TOTAL PROTEIN: 7.9 g/dL (ref 6.4–8.3)

## 2017-10-05 MED ORDER — DIPHENHYDRAMINE HCL 50 MG/ML IJ SOLN
50.0000 mg | Freq: Once | INTRAMUSCULAR | Status: AC
  Administered 2017-10-05: 50 mg via INTRAVENOUS

## 2017-10-05 MED ORDER — PALONOSETRON HCL INJECTION 0.25 MG/5ML
0.2500 mg | Freq: Once | INTRAVENOUS | Status: AC
  Administered 2017-10-05: 0.25 mg via INTRAVENOUS

## 2017-10-05 MED ORDER — DIPHENHYDRAMINE HCL 50 MG/ML IJ SOLN
INTRAMUSCULAR | Status: AC
  Filled 2017-10-05: qty 1

## 2017-10-05 MED ORDER — SODIUM CHLORIDE 0.9 % IV SOLN
Freq: Once | INTRAVENOUS | Status: AC
  Administered 2017-10-05: 11:00:00 via INTRAVENOUS

## 2017-10-05 MED ORDER — PEGFILGRASTIM 6 MG/0.6ML ~~LOC~~ PSKT
6.0000 mg | PREFILLED_SYRINGE | Freq: Once | SUBCUTANEOUS | Status: AC
  Administered 2017-10-05: 6 mg via SUBCUTANEOUS

## 2017-10-05 MED ORDER — SODIUM CHLORIDE 0.9% FLUSH
10.0000 mL | INTRAVENOUS | Status: DC | PRN
  Administered 2017-10-05: 10 mL via INTRAVENOUS
  Filled 2017-10-05: qty 10

## 2017-10-05 MED ORDER — PEGFILGRASTIM 6 MG/0.6ML ~~LOC~~ PSKT
PREFILLED_SYRINGE | SUBCUTANEOUS | Status: AC
  Filled 2017-10-05: qty 0.6

## 2017-10-05 MED ORDER — FAMOTIDINE IN NACL 20-0.9 MG/50ML-% IV SOLN
20.0000 mg | Freq: Once | INTRAVENOUS | Status: AC
  Administered 2017-10-05: 20 mg via INTRAVENOUS

## 2017-10-05 MED ORDER — SODIUM CHLORIDE 0.9 % IV SOLN
150.0000 mg/m2 | Freq: Once | INTRAVENOUS | Status: AC
  Administered 2017-10-05: 318 mg via INTRAVENOUS
  Filled 2017-10-05: qty 53

## 2017-10-05 MED ORDER — PALONOSETRON HCL INJECTION 0.25 MG/5ML
INTRAVENOUS | Status: AC
  Filled 2017-10-05: qty 5

## 2017-10-05 MED ORDER — HEPARIN SOD (PORK) LOCK FLUSH 100 UNIT/ML IV SOLN
500.0000 [IU] | Freq: Once | INTRAVENOUS | Status: AC | PRN
  Administered 2017-10-05: 500 [IU]
  Filled 2017-10-05: qty 5

## 2017-10-05 MED ORDER — SODIUM CHLORIDE 0.9% FLUSH
10.0000 mL | INTRAVENOUS | Status: DC | PRN
  Administered 2017-10-05: 10 mL
  Filled 2017-10-05: qty 10

## 2017-10-05 MED ORDER — SODIUM CHLORIDE 0.9 % IV SOLN
700.0000 mg | Freq: Once | INTRAVENOUS | Status: AC
  Administered 2017-10-05: 700 mg via INTRAVENOUS
  Filled 2017-10-05: qty 70

## 2017-10-05 MED ORDER — FAMOTIDINE IN NACL 20-0.9 MG/50ML-% IV SOLN
INTRAVENOUS | Status: AC
  Filled 2017-10-05: qty 50

## 2017-10-05 MED ORDER — SODIUM CHLORIDE 0.9 % IV SOLN
Freq: Once | INTRAVENOUS | Status: AC
  Administered 2017-10-05: 11:00:00 via INTRAVENOUS
  Filled 2017-10-05: qty 5

## 2017-10-05 NOTE — Progress Notes (Signed)
[facility] OFFICE PROGRESS NOTE  Theresa Eagles, PA-C [place] [address]  DIAGNOSIS: Metastatic non-small cell lung cancer initially diagnosed as Stage IIIA (T2b, N2, M0) non-small cell lung cancer, invasive poorly differentiated squamous cell carcinoma diagnosed in November 2016 and presented with large right hilar mass with collapse of the right middle lobe and extension into the right upper lobe and right lower lobe along the hilum with right paratracheal and subcarinal lymphadenopathy. She presented in July 2018 with metastatic intramuscular mass in the right posterior lateral chest wall, with biopsy confirmed squamous cell carcinoma. PDL1 Expression: 50%.  PRIOR THERAPY: 1) Concurrent chemoradiation with weekly carboplatin for AUC of 2 and paclitaxel 45 MG/M2. First dose 08/19/2015. Status post 5 cycles. Last cycle was given 10/07/2015 with significant response. 2) Consolidation chemotherapy with carboplatin for AUC of 5 and paclitaxel 175 MG/M2 every 3 weeks with Neulasta support. First dose 11/18/2015. Status post 3 cycles. 3) palliative radiotherapy to the intramuscular mass in the right posterior lateral chest wall under the care of Dr. Tammi Klippel. 4) Ketruda 200 mg IV every 3 weeks. First dose 05/12/2017. Status post 3 cycles.  This was discontinued secondary to disease progression.  CURRENT THERAPY: Systemic chemotherapy with carboplatin for AC of 5 and paclitaxel 175 mg/M2 every 3 weeks with Neulasta support.  Taxol dose was reduced to 150 mg/m starting with cycle #2 secondary to peripheral neuropathy. First dose August 26, 2017.  Status post 2 cycles.   INTERVAL HISTORY: Theresa [name] 57 y.o. female returns for routine follow-up visit by herself.  The patient is feeling well today has no specific complaints.  Her peripheral neuropathy has improved with use of gabapentin.  She denies fevers and chills.  Denies chest pain, shortness breath, cough,  hemoptysis.  Denies nausea, vomiting, constipation, diarrhea.  She has had no weight loss or night sweats.  The patient is here for evaluation prior to starting cycle #3 of her chemotherapy.  MEDICAL HISTORY: Past Medical History:  Diagnosis Date  . Cancer ([place])    5 years ago - cervical cancer  . Encounter for antineoplastic chemotherapy 08/27/2015  . History of radiation therapy 08/19/2015 - 10/07/2015   Site/dose:   The patient's primary tumor and involved lymph nodes were treated to 66 Gy in 30 fractions.  . Hypokalemia 09/30/2015  . Non-small cell carcinoma of lung, stage 3 ([place]) 08/08/2015  . Paroxysmal atrial flutter ([place]) 09/16/2015  . Pneumonia     ALLERGIES:  has No Known Allergies.  MEDICATIONS:  Current Outpatient Medications  Medication Sig Dispense Refill  . diphenoxylate-atropine (LOMOTIL) 2.5-0.025 MG tablet Take 2 tablets by mouth 4 (four) times daily as needed for diarrhea or loose stools. (Patient not taking: Reported on 09/15/2017) 60 tablet 1  . gabapentin (NEURONTIN) 100 MG capsule Take 3 capsules (300 mg total) by mouth 3 (three) times daily. 90 capsule 2  . lactulose (CHRONULAC) 10 GM/15ML solution Take 15 mLs (10 g total) by mouth 3 (three) times daily as needed for mild constipation. (Patient not taking: Reported on 09/02/2017) 240 mL 3  . lidocaine-prilocaine (EMLA) cream Apply 1 application topically as needed. 30 g 2  . linaclotide (LINZESS) 145 MCG CAPS capsule Take 145 mcg by mouth daily before breakfast.    . LORazepam (ATIVAN) 0.5 MG tablet 0.5 mg sublingual p.o. every 6 hours as needed for nausea and/or vomiting. 40 tablet 0  . magnesium hydroxide (MILK OF MAGNESIA) 400 MG/5ML suspension Take 5 mLs by mouth at  bedtime as needed for mild constipation.    . Multiple Vitamin (MULTIVITAMIN) capsule Take 1 capsule daily by mouth. 30 capsule 2  . ondansetron (ZOFRAN-ODT) 8 MG disintegrating tablet Take 1 tablet (8 mg total) by mouth every 8 (eight) hours as  needed for nausea or vomiting. 20 tablet 1  . oxyCODONE (OXY IR/ROXICODONE) 5 MG immediate release tablet Take 1 tablet (5 mg total) by mouth every 6 (six) hours as needed for severe pain. 60 tablet 0  . potassium chloride SA (K-DUR,KLOR-CON) 20 MEQ tablet Take 1 tablet twice a day for 5 days then 1 tablet daily. 35 tablet 0  . prochlorperazine (COMPAZINE) 10 MG tablet Take 1 tablet (10 mg total) by mouth every 6 (six) hours as needed for nausea or vomiting. (Patient not taking: Reported on 08/26/2017) 30 tablet 1   No current facility-administered medications for this visit.    Facility-Administered Medications Ordered in Other Visits  Medication Dose Route Frequency Provider Last Rate Last Dose  . 0.9 %  sodium chloride infusion   Intravenous Continuous Dottie Vaquerano R, NP      . 0.9 %  sodium chloride infusion   Intravenous Continuous Curt Bears, MD   Stopped at 05/31/17 1714  . ondansetron (ZOFRAN) injection 8 mg  8 mg Intravenous Once Curt Bears, MD        SURGICAL HISTORY:  Past Surgical History:  Procedure Laterality Date  . IR FLUORO GUIDE PORT INSERTION RIGHT  05/21/2017  . IR US GUIDE VASC ACCESS RIGHT  05/21/2017  . TUBAL LIGATION    . VIDEO BRONCHOSCOPY WITH ENDOBRONCHIAL ULTRASOUND N/A 08/02/2015   Procedure: VIDEO BRONCHOSCOPY WITH ENDOBRONCHIAL ULTRASOUND;  Surgeon: [name], MD;  Location: [facility];  Service: Thoracic;  Laterality: N/A;  . WISDOM TOOTH EXTRACTION      REVIEW OF SYSTEMS:   Review of Systems  Constitutional: Negative for appetite change, chills, fatigue, fever and unexpected weight change.  HENT:   Negative for mouth sores, nosebleeds, sore throat and trouble swallowing.   Eyes: Negative for eye problems and icterus.  Respiratory: Negative for cough, hemoptysis, shortness of breath and wheezing.   Cardiovascular: Negative for chest pain and leg swelling.  Gastrointestinal: Negative for abdominal pain, constipation, diarrhea, nausea  and vomiting.  Genitourinary: Negative for bladder incontinence, difficulty urinating, dysuria, frequency and hematuria.   Musculoskeletal: Negative for back pain, gait problem, neck pain and neck stiffness.  Skin: Negative for itching and rash.  Neurological: Negative for dizziness, extremity weakness, gait problem, headaches, light-headedness and seizures.  Hematological: Negative for adenopathy. Does not bruise/bleed easily.  Psychiatric/Behavioral: Negative for confusion, depression and sleep disturbance. The patient is not nervous/anxious.     PHYSICAL EXAMINATION:  Blood pressure 108/73, pulse (!) 108, temperature 98.1 F (36.7 C), temperature source Oral, resp. rate 19, height _0  (1.727 m), weight 205 lb 6.4 oz (93.2 kg), SpO2 100 %.  ECOG PERFORMANCE STATUS: 1 - Symptomatic but completely ambulatory  Physical Exam  Constitutional: Oriented to person, place, and time and well-developed, well-nourished, and in no distress. No distress.  HENT:  Head: Normocephalic and atraumatic.  Mouth/Throat: Oropharynx is clear and moist. No oropharyngeal exudate.  Eyes: Conjunctivae are normal. Right eye exhibits no discharge. Left eye exhibits no discharge. No scleral icterus.  Neck: Normal range of motion. Neck supple.  Cardiovascular: Normal rate, regular rhythm, normal heart sounds and intact distal pulses.   Pulmonary/Chest: Effort normal and breath sounds normal. No respiratory distress. No wheezes. No rales.  Abdominal: Soft. Bowel sounds are normal. Exhibits no distension and no mass. There is no tenderness.  Musculoskeletal: Normal range of motion. Exhibits no edema.  Lymphadenopathy:    No cervical adenopathy.  Neurological: Alert and oriented to person, place, and time. Exhibits normal muscle tone. Gait normal. Coordination normal.  Skin: Skin is warm and dry. No rash noted. Not diaphoretic. No erythema. No pallor.  Psychiatric: Mood, memory and judgment normal.  Vitals  reviewed.  LABORATORY DATA: Lab Results  Component Value Date   WBC 3.1 (L) 10/05/2017   HGB 9.1 (L) 10/05/2017   HCT 27.4 (L) 10/05/2017   MCV 95.2 10/05/2017   PLT 158 10/05/2017      Chemistry      Component Value Date/Time   NA 140 10/05/2017 1005   NA 138 09/24/2017 1600   K 3.3 10/05/2017 1005   K 3.4 (L) 09/24/2017 1600   CL 106 10/05/2017 1005   CO2 23 10/05/2017 1005   CO2 25 09/24/2017 1600   BUN 8 10/05/2017 1005   BUN 9.5 09/24/2017 1600   CREATININE 0.83 10/05/2017 1005   CREATININE 0.8 09/24/2017 1600      Component Value Date/Time   CALCIUM 9.7 10/05/2017 1005   CALCIUM 9.0 09/24/2017 1600   ALKPHOS 95 10/05/2017 1005   ALKPHOS 92 09/24/2017 1600   AST 16 10/05/2017 1005   AST 31 09/24/2017 1600   ALT 12 10/05/2017 1005   ALT 24 09/24/2017 1600   BILITOT 0.5 10/05/2017 1005   BILITOT 0.40 09/24/2017 1600       RADIOGRAPHIC STUDIES:  No results found.   ASSESSMENT/PLAN:  Metastatic primary lung cancer, right [facility]) This is a very pleasant 57 year old African-American female with metastatic non-small cell lung cancer that was initially diagnosed as stage IIIa non-small cell lung cancer, squamous cell carcinoma presented with large right hilar mass and mediastinal lymphadenopathy diagnosed in November 2016. She status post concurrent chemoradiation followed by consolidation chemotherapy. The patient was found recently to have intramuscular metastases and the right posterior chest wall that was biopsy-proven to be squamous cell carcinoma with PDL 1 expression 50%. The patient was a started on treatment with immunotherapy with Ketruda (pembrolizumab) status post 3 cycles. Unfortunately she had evidence for disease progression on the restaging scan. The patient is currently undergoing treatment with carboplatin for AC of 5 and paclitaxel 150 mg/M2 status post 2 cycles.  She tolerated the last cycle of chemotherapy fairly well.  Her peripheral neuropathy  has improved with dose reduction of Taxol.   Recommend that she proceed with cycle 3 of her chemotherapy today as scheduled.  The patient will have a restaging CT scan of the chest, abdomen, pelvis prior to her next visit.  The patient will have weekly labs and will have a return visit in 3 weeks for evaluation prior to cycle #4 and to review her restaging CT scan results.    For the peripheral neuropathy, she will continue Neurontin 100 mg p.o. 3 times daily.  The patient was advised to call immediately if she has any concerning symptoms in the interval. The patient voices understanding of current disease status and treatment options and is in agreement with the current care plan. All questions were answered. The patient knows to call the clinic with any problems, questions or concerns. We can certainly see the patient much sooner if necessary.  Orders Placed This Encounter  Procedures  . CT ABDOMEN PELVIS W CONTRAST    Standing Status:  Future    Standing Expiration Date:   10/05/2018    Order Specific Question:   If indicated for the ordered procedure, I authorize the administration of contrast media per Radiology protocol    Answer:   Yes    Order Specific Question:   Preferred imaging location?    Answer:   [facility]    Order Specific Question:   Call Results- Best Contact Number?    Answer:   [email or web address]=    Order Specific Question:   Radiology Contrast Protocol - do NOT remove file path    Answer:   \\[email or web address]    Order Specific Question:   Reason for Exam additional comments    Answer:   lung cancer. restaging.    Order Specific Question:   Is patient pregnant?    Answer:   No  . CT CHEST W CONTRAST    Standing Status:   Future    Standing Expiration Date:   10/05/2018    Order Specific Question:   If indicated for the ordered procedure, I authorize the administration of contrast media per Radiology protocol    Answer:   Yes    Order Specific  Question:   Preferred imaging location?    Answer:   [facility]    Order Specific Question:   Call Results- Best Contact Number?    Answer:   [email or web address]=    Order Specific Question:   Radiology Contrast Protocol - do NOT remove file path    Answer:   [email or web address]    Order Specific Question:   Reason for Exam additional comments    Answer:   Lung cancer. Restaging.    Order Specific Question:   Is patient pregnant?    Answer:   No    Mikey Bussing, DNP, AGPCNP-BC, AOCNP 10/06/17

## 2017-10-05 NOTE — Patient Instructions (Signed)
   [facility] Discharge Instructions for Patients Receiving Chemotherapy  Today you received the following chemotherapy agents Taxol and Carboplatin   To help prevent nausea and vomiting after your treatment, we encourage you to take your nausea medication as directed.    If you develop nausea and vomiting that is not controlled by your nausea medication, call the clinic.   BELOW ARE SYMPTOMS THAT SHOULD BE REPORTED IMMEDIATELY:  *FEVER GREATER THAN 100.5 F  *CHILLS WITH OR WITHOUT FEVER  NAUSEA AND VOMITING THAT IS NOT CONTROLLED WITH YOUR NAUSEA MEDICATION  *UNUSUAL SHORTNESS OF BREATH  *UNUSUAL BRUISING OR BLEEDING  TENDERNESS IN MOUTH AND THROAT WITH OR WITHOUT PRESENCE OF ULCERS  *URINARY PROBLEMS  *BOWEL PROBLEMS  UNUSUAL RASH Items with * indicate a potential emergency and should be followed up as soon as possible.  Feel free to call the clinic should you have any questions or concerns. The clinic phone number is [phone].  Please show the CHEMO ALERT CARD at check-in to the Emergency Department and triage nurse.   

## 2017-10-06 NOTE — Assessment & Plan Note (Signed)
This is a very pleasant 57 year old African-American female with metastatic non-small cell lung cancer that was initially diagnosed as stage IIIa non-small cell lung cancer, squamous cell carcinoma presented with large right hilar mass and mediastinal lymphadenopathy diagnosed in November 2016. She status post concurrent chemoradiation followed by consolidation chemotherapy. The patient was found recently to have intramuscular metastases and the right posterior chest wall that was biopsy-proven to be squamous cell carcinoma with PDL 1 expression 50%. The patient was a started on treatment with immunotherapy with Ketruda (pembrolizumab) status post 3 cycles. Unfortunately she had evidence for disease progression on the restaging scan. The patient is currently undergoing treatment with carboplatin for AC of 5 and paclitaxel 150 mg/M2 status post 2 cycles.  She tolerated the last cycle of chemotherapy fairly well.  Her peripheral neuropathy has improved with dose reduction of Taxol.   Recommend that she proceed with cycle 3 of her chemotherapy today as scheduled.  The patient will have a restaging CT scan of the chest, abdomen, pelvis prior to her next visit.  The patient will have weekly labs and will have a return visit in 3 weeks for evaluation prior to cycle #4 and to review her restaging CT scan results.    For the peripheral neuropathy, she will continue Neurontin 100 mg p.o. 3 times daily.  The patient was advised to call immediately if she has any concerning symptoms in the interval. The patient voices understanding of current disease status and treatment options and is in agreement with the current care plan. All questions were answered. The patient knows to call the clinic with any problems, questions or concerns. We can certainly see the patient much sooner if necessary.

## 2017-10-12 ENCOUNTER — Other Ambulatory Visit: Payer: Medicare Other

## 2017-10-14 ENCOUNTER — Telehealth: Payer: Self-pay | Admitting: *Deleted

## 2017-10-14 ENCOUNTER — Telehealth: Payer: Self-pay | Admitting: Internal Medicine

## 2017-10-14 NOTE — Telephone Encounter (Signed)
Returned phone call and rescheduled her weekly lab that was missed

## 2017-10-14 NOTE — Telephone Encounter (Signed)
Pt called c/o cough " cold, my kids had it and I think they gave it to me. I just dont want it to go into my lungs."  Pt will be here tomorrow for lab, message to scheduling for [facility] appt. Informed pt of appt. No further concerns.

## 2017-10-15 ENCOUNTER — Inpatient Hospital Stay: Payer: Medicare Other

## 2017-10-15 ENCOUNTER — Telehealth: Payer: Self-pay | Admitting: Oncology

## 2017-10-15 ENCOUNTER — Inpatient Hospital Stay (HOSPITAL_BASED_OUTPATIENT_CLINIC_OR_DEPARTMENT_OTHER): Payer: Medicare Other | Admitting: Oncology

## 2017-10-15 ENCOUNTER — Encounter: Payer: Self-pay | Admitting: *Deleted

## 2017-10-15 ENCOUNTER — Encounter: Payer: Self-pay | Admitting: Oncology

## 2017-10-15 VITALS — BP 117/79 | HR 128 | Temp 98.3°F | Resp 18 | Ht 68.0 in | Wt 208.3 lb

## 2017-10-15 DIAGNOSIS — G620 Drug-induced polyneuropathy: Secondary | ICD-10-CM

## 2017-10-15 DIAGNOSIS — Z452 Encounter for adjustment and management of vascular access device: Secondary | ICD-10-CM | POA: Diagnosis not present

## 2017-10-15 DIAGNOSIS — T451X5D Adverse effect of antineoplastic and immunosuppressive drugs, subsequent encounter: Secondary | ICD-10-CM | POA: Diagnosis not present

## 2017-10-15 DIAGNOSIS — C3491 Malignant neoplasm of unspecified part of right bronchus or lung: Secondary | ICD-10-CM

## 2017-10-15 DIAGNOSIS — C7989 Secondary malignant neoplasm of other specified sites: Secondary | ICD-10-CM

## 2017-10-15 DIAGNOSIS — Z5111 Encounter for antineoplastic chemotherapy: Secondary | ICD-10-CM | POA: Diagnosis not present

## 2017-10-15 DIAGNOSIS — D649 Anemia, unspecified: Secondary | ICD-10-CM

## 2017-10-15 DIAGNOSIS — C3481 Malignant neoplasm of overlapping sites of right bronchus and lung: Secondary | ICD-10-CM

## 2017-10-15 DIAGNOSIS — E860 Dehydration: Secondary | ICD-10-CM

## 2017-10-15 DIAGNOSIS — D630 Anemia in neoplastic disease: Secondary | ICD-10-CM

## 2017-10-15 DIAGNOSIS — G629 Polyneuropathy, unspecified: Secondary | ICD-10-CM | POA: Insufficient documentation

## 2017-10-15 DIAGNOSIS — Z5189 Encounter for other specified aftercare: Secondary | ICD-10-CM | POA: Diagnosis not present

## 2017-10-15 DIAGNOSIS — J40 Bronchitis, not specified as acute or chronic: Secondary | ICD-10-CM

## 2017-10-15 LAB — COMPREHENSIVE METABOLIC PANEL
ALK PHOS: 101 U/L (ref 40–150)
ALT: 13 U/L (ref 0–55)
ANION GAP: 11 (ref 3–11)
AST: 23 U/L (ref 5–34)
Albumin: 3.4 g/dL — ABNORMAL LOW (ref 3.5–5.0)
BILIRUBIN TOTAL: 0.4 mg/dL (ref 0.2–1.2)
BUN: 8 mg/dL (ref 7–26)
CALCIUM: 8.8 mg/dL (ref 8.4–10.4)
CO2: 24 mmol/L (ref 22–29)
Chloride: 103 mmol/L (ref 98–109)
Creatinine, Ser: 0.85 mg/dL (ref 0.60–1.10)
GFR calc non Af Amer: >60 mL/min (ref >60)
Glucose, Bld: 115 mg/dL (ref 70–140)
Potassium: 3.3 mmol/L (ref 3.3–4.7)
SODIUM: 138 mmol/L (ref 136–145)
TOTAL PROTEIN: 7.2 g/dL (ref 6.4–8.3)

## 2017-10-15 LAB — CBC WITH DIFFERENTIAL/PLATELET
Basophils Absolute: 0 10*3/uL (ref 0.0–0.1)
Basophils Relative: 1 %
EOS ABS: 0 10*3/uL (ref 0.0–0.5)
Eosinophils Relative: 0 %
HCT: 22.6 % — ABNORMAL LOW (ref 34.8–46.6)
HEMOGLOBIN: 7.5 g/dL — AB (ref 11.6–15.9)
LYMPHS ABS: 0.4 10*3/uL — AB (ref 0.9–3.3)
Lymphocytes Relative: 10 %
MCH: 31.8 pg (ref 25.1–34.0)
MCHC: 33.1 g/dL (ref 31.5–36.0)
MCV: 95.8 fL (ref 79.5–101.0)
MONO ABS: 0.3 10*3/uL (ref 0.1–0.9)
MONOS PCT: 6 %
Neutro Abs: 3.2 10*3/uL (ref 1.5–6.5)
Neutrophils Relative %: 83 %
Platelets: 81 10*3/uL — ABNORMAL LOW (ref 145–400)
RBC: 2.35 MIL/uL — ABNORMAL LOW (ref 3.70–5.45)
RDW: 22.9 % — AB (ref 11.2–16.1)
WBC: 3.9 10*3/uL (ref 3.9–10.3)

## 2017-10-15 LAB — ABO/RH: ABO/RH(D): O POS

## 2017-10-15 MED ORDER — AMOXICILLIN-POT CLAVULANATE 875-125 MG PO TABS: 1.0000 | ORAL_TABLET | Freq: Two times a day (BID) | ORAL | 0 refills | Status: DC

## 2017-10-15 MED ORDER — HEPARIN SOD (PORK) LOCK FLUSH 100 UNIT/ML IV SOLN
500.0000 [IU] | Freq: Once | INTRAVENOUS | Status: DC | PRN
  Filled 2017-10-15: qty 5

## 2017-10-15 MED ORDER — SODIUM CHLORIDE 0.9% FLUSH
10.0000 mL | INTRAVENOUS | Status: DC | PRN
  Administered 2017-10-15: 10 mL
  Filled 2017-10-15: qty 10

## 2017-10-15 MED ORDER — GABAPENTIN 100 MG PO CAPS: 200.0000 mg | ORAL_CAPSULE | Freq: Three times a day (TID) | ORAL | 1 refills | Status: DC

## 2017-10-15 NOTE — Assessment & Plan Note (Signed)
This is a very pleasant 57year old African-American female with metastatic non-small cell lung cancer that was initially diagnosed as stage IIIa non-small cell lung cancer, squamous cell carcinoma presented with large right hilar mass and mediastinal lymphadenopathy diagnosed in November 2016. She status post concurrent chemoradiation followed by consolidation chemotherapy. The patient was found recently to have intramuscular metastases and the right posterior chest wall that was biopsy-proven to be squamous cell carcinoma with PDL 1 expression 50%. The patient was a started on treatment with immunotherapy with Ketruda (pembrolizumab) status post 3 cycles. Unfortunately she had evidence for disease progression on the restaging scan. The patient is currently undergoing treatment with carboplatin for AUC of 5 and paclitaxel 150 mg/M2 status post 3 cycles.  She tolerated the last cycle of chemotherapy fairly well except for worsening of her peripheral neuropathy.  The patient returns today for work in visit due to increased cough and congestion.  She is afebrile.  White count is normal.  She has diminished breath sounds in the right base.  I will give her Augmentin 875 mg twice a day for 10 days.  Labs reviewed and the patient is noted to be anemic.  Her hemoglobin is 7.5 and she is symptomatic with tachycardia and dizziness.  The patient will receive 2 units of packed red blood cells here in our office tomorrow.  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  Taxol has already been dose reduced.  I have instructed the patient to increase her gabapentin to 200 mg 3 times a day.  A new prescription was sent to her pharmacy.  The patient will keep her appointments for weekly labs.  She is scheduled to have a CT scan for restaging prior to her next visit.  She will keep her follow-up visit as scheduled for evaluation prior to cycle 4 of her chemotherapy and to review her restaging CT  scan results.  The patient was advised to call immediately if she has any concerning symptoms in the interval. The patient voices understanding of current disease status and treatment options and is in agreement with the current care plan. All questions were answered. The patient knows to call the clinic with any problems, questions or concerns. We can certainly see the patient much sooner if necessary.

## 2017-10-15 NOTE — Progress Notes (Signed)
[facility] OFFICE PROGRESS NOTE  [name], PA-C [place] [address]  DIAGNOSIS: Metastatic non-small cell lung cancer initially diagnosed as Stage IIIA (T2b, N2, M0) non-small cell lung cancer, invasive poorly differentiated squamous cell carcinoma diagnosed in November 2016 and presented with large right hilar mass with collapse of the right middle lobe and extension into the right upper lobe and right lower lobe along the hilum with right paratracheal and subcarinal lymphadenopathy. She presented in July 2018 with metastatic intramuscular mass in the right posterior lateral chest wall, with biopsy confirmed squamous cell carcinoma. PDL1 Expression: 50%.  PRIOR THERAPY: 1) Concurrent chemoradiation with weekly carboplatin for AUC of 2 and paclitaxel 45 MG/M2. First dose 08/19/2015. Status post 5 cycles. Last cycle was given 10/07/2015 with significant response. 2) Consolidation chemotherapy with carboplatin for AUC of 5 and paclitaxel 175 MG/M2 every 3 weeks with Neulasta support. First dose 11/18/2015. Status post 3 cycles. 3) palliative radiotherapy to the intramuscular mass in the right posterior lateral chest wall under the care of Dr. Tammi Klippel. 4) Ketruda 200 mg IV every 3 weeks. First dose 05/12/2017. Status post 3 cycles. This was discontinued secondary to disease progression.  CURRENT THERAPY: Systemic chemotherapy with carboplatin for AUC of 5 and paclitaxel 175 mg/M2 every 3 weeks with Neulasta support.  Taxol dose was reduced to 150 mg/m starting with cycle #2 secondary to peripheral neuropathy.First dose August 26, 2017.Status post 3 cycles.    INTERVAL HISTORY: Theresa [name] 57 y.o. female returns for a work in visit accompanied by her daughter.  The patient contacted our office late yesterday reported increased cough and congestion.  Family numbers have been sick and she was worried about getting pneumonia.  The patient reports that she has  had a nonproductive cough for the past 3-4 days.  She denies having fevers or chills.  Denies chest pain, shortness of breath at rest, hemoptysis.  Reports dyspnea on exertion.  Denies nausea, vomiting, constipation, diarrhea.  Reports dizziness for the past 2-3 days.  Denies bleeding.  Peripheral neuropathy has worsened.  She is currently on gabapentin 100 mg 3 times a day.  Taxol has been dose reduced secondary to peripheral neuropathy.  Patient is here for evaluation and repeat lab work.  MEDICAL HISTORY: Past Medical History:  Diagnosis Date  . Cancer ([place])    5 years ago - cervical cancer  . Encounter for antineoplastic chemotherapy 08/27/2015  . History of radiation therapy 08/19/2015 - 10/07/2015   Site/dose:   The patient's primary tumor and involved lymph nodes were treated to 66 Gy in 30 fractions.  . Hypokalemia 09/30/2015  . Non-small cell carcinoma of lung, stage 3 ([place]) 08/08/2015  . Paroxysmal atrial flutter ([place]) 09/16/2015  . Pneumonia     ALLERGIES:  has No Known Allergies.  MEDICATIONS:  Current Outpatient Medications  Medication Sig Dispense Refill  . calcium-vitamin D 250-100 MG-UNIT tablet Take 1 tablet by mouth 2 (two) times daily.    Marland Kitchen lidocaine-prilocaine (EMLA) cream Apply 1 application topically as needed. 30 g 2  . Multiple Vitamin (MULTIVITAMIN) capsule Take 1 capsule daily by mouth. 30 capsule 2  . ondansetron (ZOFRAN-ODT) 8 MG disintegrating tablet Take 1 tablet (8 mg total) by mouth every 8 (eight) hours as needed for nausea or vomiting. 20 tablet 1  . oxyCODONE (OXY IR/ROXICODONE) 5 MG immediate release tablet Take 1 tablet (5 mg total) by mouth every 6 (six) hours as needed for severe pain. 60 tablet 0  .  potassium chloride SA (K-DUR,KLOR-CON) 20 MEQ tablet Take 1 tablet twice a day for 5 days then 1 tablet daily. 35 tablet 0  . prochlorperazine (COMPAZINE) 10 MG tablet Take 1 tablet (10 mg total) by mouth every 6 (six) hours as needed for nausea or vomiting.  30 tablet 1  . amoxicillin-clavulanate (AUGMENTIN) 875-125 MG tablet Take 1 tablet by mouth 2 (two) times daily. 20 tablet 0  . diphenoxylate-atropine (LOMOTIL) 2.5-0.025 MG tablet Take 2 tablets by mouth 4 (four) times daily as needed for diarrhea or loose stools. (Patient not taking: Reported on 09/15/2017) 60 tablet 1  . gabapentin (NEURONTIN) 100 MG capsule Take 2 capsules (200 mg total) by mouth 3 (three) times daily. 180 capsule 1  . lactulose (CHRONULAC) 10 GM/15ML solution Take 15 mLs (10 g total) by mouth 3 (three) times daily as needed for mild constipation. (Patient not taking: Reported on 09/02/2017) 240 mL 3  . linaclotide (LINZESS) 145 MCG CAPS capsule Take 145 mcg by mouth daily before breakfast.    . LORazepam (ATIVAN) 0.5 MG tablet 0.5 mg sublingual p.o. every 6 hours as needed for nausea and/or vomiting. (Patient not taking: Reported on 10/15/2017) 40 tablet 0  . magnesium hydroxide (MILK OF MAGNESIA) 400 MG/5ML suspension Take 5 mLs by mouth at bedtime as needed for mild constipation.     No current facility-administered medications for this visit.    Facility-Administered Medications Ordered in Other Visits  Medication Dose Route Frequency Provider Last Rate Last Dose  . 0.9 %  sodium chloride infusion   Intravenous Continuous [place] Roker R, NP      . 0.9 %  sodium chloride infusion   Intravenous Continuous Curt Bears, MD   Stopped at 05/31/17 1714  . ondansetron (ZOFRAN) injection 8 mg  8 mg Intravenous Once Curt Bears, MD        SURGICAL HISTORY:  Past Surgical History:  Procedure Laterality Date  . IR FLUORO GUIDE PORT INSERTION RIGHT  05/21/2017  . IR US GUIDE VASC ACCESS RIGHT  05/21/2017  . TUBAL LIGATION    . VIDEO BRONCHOSCOPY WITH ENDOBRONCHIAL ULTRASOUND N/A 08/02/2015   Procedure: VIDEO BRONCHOSCOPY WITH ENDOBRONCHIAL ULTRASOUND;  Surgeon: [name], MD;  Location: [facility];  Service: Thoracic;  Laterality: N/A;  . WISDOM TOOTH EXTRACTION       REVIEW OF SYSTEMS:   Review of Systems  Constitutional: Negative for appetite change, chills, fatigue, fever and unexpected weight change.  HENT:   Negative for mouth sores, nosebleeds, sore throat and trouble swallowing.   Eyes: Negative for eye problems and icterus.  Respiratory: Negative for hemoptysis, shortness of breath at rest and wheezing.  Positive for nonproductive cough and dyspnea on exertion. Cardiovascular: Negative for chest pain and leg swelling.  Gastrointestinal: Negative for abdominal pain, constipation, diarrhea, nausea and vomiting.  Genitourinary: Negative for bladder incontinence, difficulty urinating, dysuria, frequency and hematuria.   Musculoskeletal: Negative for back pain, gait problem, neck pain and neck stiffness.  Skin: Negative for itching and rash.  Neurological: Negative for extremity weakness, gait problem, headaches, light-headedness and seizures. Positive for dizziness.  Reports worsening neuropathy to her feet. Hematological: Negative for adenopathy. Does not bruise/bleed easily.  Psychiatric/Behavioral: Negative for confusion, depression and sleep disturbance. The patient is not nervous/anxious.     PHYSICAL EXAMINATION:  Blood pressure 117/79, pulse (!) 128, temperature 98.3 F (36.8 C), temperature source Oral, resp. rate 18, height '5\' 8"'$  (1.727 m), weight 208 lb 4.8 oz (94.5 kg), SpO2 100 %.  ECOG PERFORMANCE STATUS: 1 - Symptomatic but completely ambulatory  Physical Exam  Constitutional: Oriented to person, place, and time and well-developed, well-nourished, and in no distress. No distress.  HENT:  Head: Normocephalic and atraumatic.  Mouth/Throat: Oropharynx is clear and moist. No oropharyngeal exudate.  Eyes: Conjunctivae are normal. Right eye exhibits no discharge. Left eye exhibits no discharge. No scleral icterus.  Neck: Normal range of motion. Neck supple.  Cardiovascular: Tachycardic.  Regular rhythm, normal heart sounds and  intact distal pulses.   Pulmonary/Chest: Effort normal. No respiratory distress. No wheezes. No rales. Diminished breath sounds right base. Abdominal: Soft. Bowel sounds are normal. Exhibits no distension and no mass. There is no tenderness.  Musculoskeletal: Normal range of motion. Exhibits no edema.  Lymphadenopathy:    No cervical adenopathy.  Neurological: Alert and oriented to person, place, and time. Exhibits normal muscle tone. Gait normal. Coordination normal.  Skin: Skin is warm and dry. No rash noted. Not diaphoretic. No erythema. No pallor.  Psychiatric: Mood, memory and judgment normal.  Vitals reviewed.  LABORATORY DATA: Lab Results  Component Value Date   WBC 3.9 10/15/2017   HGB 7.5 (L) 10/15/2017   HCT 22.6 (L) 10/15/2017   MCV 95.8 10/15/2017   PLT 81 (L) 10/15/2017      Chemistry      Component Value Date/Time   NA 138 10/15/2017 0959   NA 138 09/24/2017 1600   K 3.3 10/15/2017 0959   K 3.4 (L) 09/24/2017 1600   CL 103 10/15/2017 0959   CO2 24 10/15/2017 0959   CO2 25 09/24/2017 1600   BUN 8 10/15/2017 0959   BUN 9.5 09/24/2017 1600   CREATININE 0.85 10/15/2017 0959   CREATININE 0.8 09/24/2017 1600      Component Value Date/Time   CALCIUM 8.8 10/15/2017 0959   CALCIUM 9.0 09/24/2017 1600   ALKPHOS 101 10/15/2017 0959   ALKPHOS 92 09/24/2017 1600   AST 23 10/15/2017 0959   AST 31 09/24/2017 1600   ALT 13 10/15/2017 0959   ALT 24 09/24/2017 1600   BILITOT 0.4 10/15/2017 0959   BILITOT 0.40 09/24/2017 1600       RADIOGRAPHIC STUDIES:  No results found.   ASSESSMENT/PLAN:  Metastatic primary lung cancer, right [facility]) This is a very pleasant 57year old African-American female with metastatic non-small cell lung cancer that was initially diagnosed as stage IIIa non-small cell lung cancer, squamous cell carcinoma presented with large right hilar mass and mediastinal lymphadenopathy diagnosed in November 2016. She status post concurrent  chemoradiation followed by consolidation chemotherapy. The patient was found recently to have intramuscular metastases and the right posterior chest wall that was biopsy-proven to be squamous cell carcinoma with PDL 1 expression 50%. The patient was a started on treatment with immunotherapy with Ketruda (pembrolizumab) status post 3 cycles. Unfortunately she had evidence for disease progression on the restaging scan. The patient is currently undergoing treatment with carboplatin for AUC of 5 and paclitaxel 150 mg/M2 status post 3 cycles.  She tolerated the last cycle of chemotherapy fairly well except for worsening of her peripheral neuropathy.  The patient returns today for work in visit due to increased cough and congestion.  She is afebrile.  White count is normal.  She has diminished breath sounds in the right base.  I will give her Augmentin 875 mg twice a day for 10 days.  Labs reviewed and the patient is noted to be anemic.  Her hemoglobin is 7.5 and she is symptomatic  with tachycardia and dizziness.  The patient will receive 2 units of packed red blood cells here in our office tomorrow.  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  Taxol has already been dose reduced.  I have instructed the patient to increase her gabapentin to 200 mg 3 times a day.  A new prescription was sent to her pharmacy.  The patient will keep her appointments for weekly labs.  She is scheduled to have a CT scan for restaging prior to her next visit.  She will keep her follow-up visit as scheduled for evaluation prior to cycle 4 of her chemotherapy and to review her restaging CT scan results.  The patient was advised to call immediately if she has any concerning symptoms in the interval. The patient voices understanding of current disease status and treatment options and is in agreement with the current care plan. All questions were answered. The patient knows to call the clinic with any  problems, questions or concerns. We can certainly see the patient much sooner if necessary.  Orders Placed This Encounter  Procedures  . Practitioner attestation of consent    I, the ordering practitioner, attest that I have discussed with the patient the benefits, risks, side effects, alternatives, likelihood of achieving goals and potential problems during recovery for the procedure listed.    Standing Status:   Future    Standing Expiration Date:   10/15/2018    Order Specific Question:   Procedure    Answer:   Blood Product(s)  . Complete patient signature process for consent form    Standing Status:   Future    Standing Expiration Date:   10/15/2018  . Care order/instruction    Transfuse Parameters    Standing Status:   Future    Standing Expiration Date:   10/15/2018  . Type and screen    Standing Status:   Future    Number of Occurrences:   1    Standing Expiration Date:   10/15/2018    Mikey Bussing, DNP, AGPCNP-BC, AOCNP 10/15/17

## 2017-10-15 NOTE — Patient Instructions (Signed)
Your hemoglobin today is 7.5.  You will received 2 units of blood tomorrow here at the cancer center.  We will continue to check your labs weekly.  For your cough and congestion, I have prescribed Augmentin twice a day for 10 days.  Please pick this up at your pharmacy.  Monitor your temperature and call us if you have a fever of 100.5 or higher.  For your neuropathy, increase your gabapentin to 200 mg 3 times a day.  A new prescription was sent to your pharmacy.

## 2017-10-15 NOTE — Telephone Encounter (Signed)
No 1/25 los.  

## 2017-10-16 ENCOUNTER — Inpatient Hospital Stay: Payer: Medicare Other

## 2017-10-16 DIAGNOSIS — Z5189 Encounter for other specified aftercare: Secondary | ICD-10-CM | POA: Diagnosis not present

## 2017-10-16 DIAGNOSIS — C7989 Secondary malignant neoplasm of other specified sites: Secondary | ICD-10-CM | POA: Diagnosis not present

## 2017-10-16 DIAGNOSIS — G620 Drug-induced polyneuropathy: Secondary | ICD-10-CM | POA: Diagnosis not present

## 2017-10-16 DIAGNOSIS — Z5111 Encounter for antineoplastic chemotherapy: Secondary | ICD-10-CM | POA: Diagnosis not present

## 2017-10-16 DIAGNOSIS — D630 Anemia in neoplastic disease: Secondary | ICD-10-CM

## 2017-10-16 DIAGNOSIS — C3481 Malignant neoplasm of overlapping sites of right bronchus and lung: Secondary | ICD-10-CM | POA: Diagnosis not present

## 2017-10-16 DIAGNOSIS — Z452 Encounter for adjustment and management of vascular access device: Secondary | ICD-10-CM | POA: Diagnosis not present

## 2017-10-16 LAB — PREPARE RBC (CROSSMATCH)

## 2017-10-16 MED ORDER — ACETAMINOPHEN 325 MG PO TABS
ORAL_TABLET | ORAL | Status: AC
  Filled 2017-10-16: qty 2

## 2017-10-16 MED ORDER — SODIUM CHLORIDE 0.9 % IV SOLN
250.0000 mL | Freq: Once | INTRAVENOUS | Status: AC
  Administered 2017-10-16: 250 mL via INTRAVENOUS

## 2017-10-16 MED ORDER — ACETAMINOPHEN 325 MG PO TABS
650.0000 mg | ORAL_TABLET | Freq: Once | ORAL | Status: AC
  Administered 2017-10-16: 650 mg via ORAL

## 2017-10-16 MED ORDER — DIPHENHYDRAMINE HCL 25 MG PO CAPS
ORAL_CAPSULE | ORAL | Status: AC
  Filled 2017-10-16: qty 1

## 2017-10-16 MED ORDER — HEPARIN SOD (PORK) LOCK FLUSH 100 UNIT/ML IV SOLN
500.0000 [IU] | Freq: Every day | INTRAVENOUS | Status: AC | PRN
  Administered 2017-10-16: 500 [IU]
  Filled 2017-10-16: qty 5

## 2017-10-16 MED ORDER — SODIUM CHLORIDE 0.9% FLUSH
3.0000 mL | INTRAVENOUS | Status: AC | PRN
  Administered 2017-10-16: 10 mL
  Filled 2017-10-16: qty 10

## 2017-10-16 MED ORDER — DIPHENHYDRAMINE HCL 25 MG PO CAPS
25.0000 mg | ORAL_CAPSULE | Freq: Once | ORAL | Status: AC
  Administered 2017-10-16: 25 mg via ORAL

## 2017-10-16 NOTE — Patient Instructions (Signed)

## 2017-10-17 LAB — BPAM RBC
BLOOD PRODUCT EXPIRATION DATE: 201902262359
Blood Product Expiration Date: 201902262359
ISSUE DATE / TIME: 201901261009
ISSUE DATE / TIME: 201901261009
UNIT TYPE AND RH: 5100
Unit Type and Rh: 5100

## 2017-10-17 LAB — TYPE AND SCREEN
ABO/RH(D): O POS
Antibody Screen: NEGATIVE
Unit division: 0
Unit division: 0

## 2017-10-18 ENCOUNTER — Other Ambulatory Visit: Payer: Self-pay | Admitting: Medical Oncology

## 2017-10-18 DIAGNOSIS — C3491 Malignant neoplasm of unspecified part of right bronchus or lung: Secondary | ICD-10-CM

## 2017-10-18 MED ORDER — OXYCODONE HCL 5 MG PO TABS: 5.0000 mg | ORAL_TABLET | Freq: Four times a day (QID) | ORAL | 0 refills | Status: DC | PRN

## 2017-10-19 ENCOUNTER — Inpatient Hospital Stay: Payer: Medicare Other

## 2017-10-19 DIAGNOSIS — Z5111 Encounter for antineoplastic chemotherapy: Secondary | ICD-10-CM | POA: Diagnosis not present

## 2017-10-19 DIAGNOSIS — C3481 Malignant neoplasm of overlapping sites of right bronchus and lung: Secondary | ICD-10-CM | POA: Diagnosis not present

## 2017-10-19 DIAGNOSIS — E860 Dehydration: Secondary | ICD-10-CM

## 2017-10-19 DIAGNOSIS — G620 Drug-induced polyneuropathy: Secondary | ICD-10-CM | POA: Diagnosis not present

## 2017-10-19 DIAGNOSIS — Z5189 Encounter for other specified aftercare: Secondary | ICD-10-CM | POA: Diagnosis not present

## 2017-10-19 DIAGNOSIS — Z452 Encounter for adjustment and management of vascular access device: Secondary | ICD-10-CM | POA: Diagnosis not present

## 2017-10-19 DIAGNOSIS — C7989 Secondary malignant neoplasm of other specified sites: Secondary | ICD-10-CM | POA: Diagnosis not present

## 2017-10-19 DIAGNOSIS — C3491 Malignant neoplasm of unspecified part of right bronchus or lung: Secondary | ICD-10-CM

## 2017-10-19 LAB — COMPREHENSIVE METABOLIC PANEL
ALT: 13 U/L (ref 0–55)
AST: 20 U/L (ref 5–34)
Albumin: 3.3 g/dL — ABNORMAL LOW (ref 3.5–5.0)
Alkaline Phosphatase: 102 U/L (ref 40–150)
Anion gap: 8 (ref 3–11)
BUN: 5 mg/dL — AB (ref 7–26)
CHLORIDE: 105 mmol/L (ref 98–109)
CO2: 27 mmol/L (ref 22–29)
Calcium: 9.1 mg/dL (ref 8.4–10.4)
Creatinine, Ser: 0.74 mg/dL (ref 0.60–1.10)
Glucose, Bld: 95 mg/dL (ref 70–140)
POTASSIUM: 3.5 mmol/L (ref 3.3–4.7)
Sodium: 140 mmol/L (ref 136–145)
Total Bilirubin: 0.4 mg/dL (ref 0.2–1.2)
Total Protein: 7.3 g/dL (ref 6.4–8.3)

## 2017-10-19 LAB — CBC WITH DIFFERENTIAL/PLATELET
Basophils Absolute: 0 10*3/uL (ref 0.0–0.1)
Basophils Relative: 1 %
Eosinophils Absolute: 0 10*3/uL (ref 0.0–0.5)
Eosinophils Relative: 0 %
HEMATOCRIT: 30.5 % — AB (ref 34.8–46.6)
HEMOGLOBIN: 10.1 g/dL — AB (ref 11.6–15.9)
LYMPHS ABS: 0.4 10*3/uL — AB (ref 0.9–3.3)
LYMPHS PCT: 12 %
MCH: 31.3 pg (ref 25.1–34.0)
MCHC: 33.2 g/dL (ref 31.5–36.0)
MCV: 94.3 fL (ref 79.5–101.0)
Monocytes Absolute: 0.3 10*3/uL (ref 0.1–0.9)
Monocytes Relative: 8 %
NEUTROS PCT: 79 %
Neutro Abs: 2.8 10*3/uL (ref 1.5–6.5)
Platelets: 113 10*3/uL — ABNORMAL LOW (ref 145–400)
RBC: 3.23 MIL/uL — AB (ref 3.70–5.45)
RDW: 20.9 % — ABNORMAL HIGH (ref 11.2–16.1)
WBC: 3.5 10*3/uL — AB (ref 3.9–10.3)

## 2017-10-19 MED ORDER — SODIUM CHLORIDE 0.9% FLUSH
10.0000 mL | INTRAVENOUS | Status: DC | PRN
  Administered 2017-10-19: 10 mL
  Filled 2017-10-19: qty 10

## 2017-10-19 MED ORDER — HEPARIN SOD (PORK) LOCK FLUSH 100 UNIT/ML IV SOLN
500.0000 [IU] | Freq: Once | INTRAVENOUS | Status: AC | PRN
  Administered 2017-10-19: 500 [IU]
  Filled 2017-10-19: qty 5

## 2017-10-21 ENCOUNTER — Other Ambulatory Visit: Payer: Medicare Other

## 2017-10-21 ENCOUNTER — Ambulatory Visit: Payer: Medicare Other

## 2017-10-21 ENCOUNTER — Ambulatory Visit: Payer: Medicare Other | Admitting: Internal Medicine

## 2017-10-25 ENCOUNTER — Telehealth: Payer: Self-pay | Admitting: Internal Medicine

## 2017-10-25 ENCOUNTER — Ambulatory Visit (HOSPITAL_COMMUNITY)
Admission: RE | Admit: 2017-10-25 | Discharge: 2017-10-25 | Disposition: A | Discharge status: Home / Self Care | Payer: Medicare Other | Source: Ambulatory Visit | Attending: Oncology | Admitting: Oncology

## 2017-10-25 DIAGNOSIS — C7989 Secondary malignant neoplasm of other specified sites: Secondary | ICD-10-CM | POA: Insufficient documentation

## 2017-10-25 DIAGNOSIS — Z5111 Encounter for antineoplastic chemotherapy: Secondary | ICD-10-CM | POA: Diagnosis not present

## 2017-10-25 DIAGNOSIS — C3490 Malignant neoplasm of unspecified part of unspecified bronchus or lung: Secondary | ICD-10-CM | POA: Diagnosis not present

## 2017-10-25 DIAGNOSIS — I700 Atherosclerosis of aorta: Secondary | ICD-10-CM | POA: Diagnosis not present

## 2017-10-25 DIAGNOSIS — J90 Pleural effusion, not elsewhere classified: Secondary | ICD-10-CM | POA: Diagnosis not present

## 2017-10-25 DIAGNOSIS — C3491 Malignant neoplasm of unspecified part of right bronchus or lung: Secondary | ICD-10-CM | POA: Insufficient documentation

## 2017-10-25 DIAGNOSIS — K7689 Other specified diseases of liver: Secondary | ICD-10-CM | POA: Diagnosis not present

## 2017-10-25 MED ORDER — HEPARIN SOD (PORK) LOCK FLUSH 100 UNIT/ML IV SOLN
500.0000 [IU] | Freq: Once | INTRAVENOUS | Status: AC
  Administered 2017-10-25: 500 [IU] via INTRAVENOUS

## 2017-10-25 MED ORDER — HEPARIN SOD (PORK) LOCK FLUSH 100 UNIT/ML IV SOLN
INTRAVENOUS | Status: AC
  Filled 2017-10-25: qty 5

## 2017-10-25 MED ORDER — IOPAMIDOL (ISOVUE-300) INJECTION 61%
100.0000 mL | Freq: Once | INTRAVENOUS | Status: AC | PRN
  Administered 2017-10-25: 100 mL via INTRAVENOUS

## 2017-10-25 NOTE — Telephone Encounter (Signed)
Patient requested a call back to confirm appointment for 2/5.  I returned call and confirmed with patient

## 2017-10-26 ENCOUNTER — Encounter: Payer: Self-pay | Admitting: Internal Medicine

## 2017-10-26 ENCOUNTER — Inpatient Hospital Stay: Payer: Medicare Other

## 2017-10-26 ENCOUNTER — Inpatient Hospital Stay (HOSPITAL_BASED_OUTPATIENT_CLINIC_OR_DEPARTMENT_OTHER): Payer: Medicare Other | Admitting: Internal Medicine

## 2017-10-26 ENCOUNTER — Inpatient Hospital Stay: Payer: Medicare Other | Attending: Internal Medicine

## 2017-10-26 VITALS — BP 127/73 | HR 118 | Temp 98.6°F | Resp 18 | Ht 68.0 in | Wt 209.6 lb

## 2017-10-26 DIAGNOSIS — G609 Hereditary and idiopathic neuropathy, unspecified: Secondary | ICD-10-CM

## 2017-10-26 DIAGNOSIS — G629 Polyneuropathy, unspecified: Secondary | ICD-10-CM | POA: Insufficient documentation

## 2017-10-26 DIAGNOSIS — C3491 Malignant neoplasm of unspecified part of right bronchus or lung: Secondary | ICD-10-CM

## 2017-10-26 DIAGNOSIS — D701 Agranulocytosis secondary to cancer chemotherapy: Secondary | ICD-10-CM | POA: Diagnosis not present

## 2017-10-26 DIAGNOSIS — Z5111 Encounter for antineoplastic chemotherapy: Secondary | ICD-10-CM | POA: Insufficient documentation

## 2017-10-26 DIAGNOSIS — Z452 Encounter for adjustment and management of vascular access device: Secondary | ICD-10-CM | POA: Diagnosis present

## 2017-10-26 DIAGNOSIS — C3481 Malignant neoplasm of overlapping sites of right bronchus and lung: Secondary | ICD-10-CM

## 2017-10-26 DIAGNOSIS — R112 Nausea with vomiting, unspecified: Secondary | ICD-10-CM | POA: Insufficient documentation

## 2017-10-26 DIAGNOSIS — I483 Typical atrial flutter: Secondary | ICD-10-CM

## 2017-10-26 DIAGNOSIS — C7989 Secondary malignant neoplasm of other specified sites: Secondary | ICD-10-CM | POA: Diagnosis not present

## 2017-10-26 DIAGNOSIS — E860 Dehydration: Secondary | ICD-10-CM

## 2017-10-26 LAB — COMPREHENSIVE METABOLIC PANEL
ALT: 13 U/L (ref 0–55)
AST: 26 U/L (ref 5–34)
Albumin: 3.4 g/dL — ABNORMAL LOW (ref 3.5–5.0)
Alkaline Phosphatase: 99 U/L (ref 40–150)
Anion gap: 9 (ref 3–11)
BILIRUBIN TOTAL: 0.4 mg/dL (ref 0.2–1.2)
BUN: 9 mg/dL (ref 7–26)
CHLORIDE: 106 mmol/L (ref 98–109)
CO2: 25 mmol/L (ref 22–29)
Calcium: 9.1 mg/dL (ref 8.4–10.4)
Creatinine, Ser: 0.78 mg/dL (ref 0.60–1.10)
GFR calc Af Amer: >60 mL/min (ref >60)
GLUCOSE: 109 mg/dL (ref 70–140)
Potassium: 3.2 mmol/L — ABNORMAL LOW (ref 3.5–5.1)
Sodium: 140 mmol/L (ref 136–145)
TOTAL PROTEIN: 7.5 g/dL (ref 6.4–8.3)

## 2017-10-26 LAB — CBC WITH DIFFERENTIAL/PLATELET
BASOS ABS: 0 10*3/uL (ref 0.0–0.1)
Basophils Relative: 1 %
Eosinophils Absolute: 0 10*3/uL (ref 0.0–0.5)
Eosinophils Relative: 0 %
HEMATOCRIT: 29.7 % — AB (ref 34.8–46.6)
Hemoglobin: 10 g/dL — ABNORMAL LOW (ref 11.6–15.9)
LYMPHS PCT: 13 %
Lymphs Abs: 0.5 10*3/uL — ABNORMAL LOW (ref 0.9–3.3)
MCH: 32 pg (ref 25.1–34.0)
MCHC: 33.5 g/dL (ref 31.5–36.0)
MCV: 95.3 fL (ref 79.5–101.0)
Monocytes Absolute: 0.4 10*3/uL (ref 0.1–0.9)
Monocytes Relative: 12 %
NEUTROS ABS: 2.6 10*3/uL (ref 1.5–6.5)
NEUTROS PCT: 74 %
Platelets: 140 10*3/uL — ABNORMAL LOW (ref 145–400)
RBC: 3.12 MIL/uL — AB (ref 3.70–5.45)
RDW: 22.9 % — ABNORMAL HIGH (ref 11.2–14.5)
WBC: 3.5 10*3/uL — AB (ref 3.9–10.3)

## 2017-10-26 MED ORDER — SODIUM CHLORIDE 0.9 % IV SOLN
700.0000 mg | Freq: Once | INTRAVENOUS | Status: AC
  Administered 2017-10-26: 700 mg via INTRAVENOUS
  Filled 2017-10-26: qty 70

## 2017-10-26 MED ORDER — HEPARIN SOD (PORK) LOCK FLUSH 100 UNIT/ML IV SOLN
500.0000 [IU] | Freq: Once | INTRAVENOUS | Status: AC | PRN
  Administered 2017-10-26: 500 [IU]
  Filled 2017-10-26: qty 5

## 2017-10-26 MED ORDER — DIPHENHYDRAMINE HCL 50 MG/ML IJ SOLN
INTRAMUSCULAR | Status: AC
  Filled 2017-10-26: qty 1

## 2017-10-26 MED ORDER — FAMOTIDINE IN NACL 20-0.9 MG/50ML-% IV SOLN
INTRAVENOUS | Status: AC
  Filled 2017-10-26: qty 50

## 2017-10-26 MED ORDER — SODIUM CHLORIDE 0.9 % IV SOLN
Freq: Once | INTRAVENOUS | Status: AC
  Administered 2017-10-26: 11:00:00 via INTRAVENOUS

## 2017-10-26 MED ORDER — PEGFILGRASTIM 6 MG/0.6ML ~~LOC~~ PSKT
6.0000 mg | PREFILLED_SYRINGE | Freq: Once | SUBCUTANEOUS | Status: AC
  Administered 2017-10-26: 6 mg via SUBCUTANEOUS

## 2017-10-26 MED ORDER — SODIUM CHLORIDE 0.9% FLUSH
10.0000 mL | INTRAVENOUS | Status: DC | PRN
  Administered 2017-10-26: 10 mL
  Filled 2017-10-26: qty 10

## 2017-10-26 MED ORDER — PEGFILGRASTIM 6 MG/0.6ML ~~LOC~~ PSKT
PREFILLED_SYRINGE | SUBCUTANEOUS | Status: AC
  Filled 2017-10-26: qty 0.6

## 2017-10-26 MED ORDER — SODIUM CHLORIDE 0.9 % IV SOLN
150.0000 mg/m2 | Freq: Once | INTRAVENOUS | Status: AC
  Administered 2017-10-26: 318 mg via INTRAVENOUS
  Filled 2017-10-26: qty 53

## 2017-10-26 MED ORDER — FAMOTIDINE IN NACL 20-0.9 MG/50ML-% IV SOLN
20.0000 mg | Freq: Once | INTRAVENOUS | Status: AC
  Administered 2017-10-26: 20 mg via INTRAVENOUS

## 2017-10-26 MED ORDER — DIPHENHYDRAMINE HCL 50 MG/ML IJ SOLN
50.0000 mg | Freq: Once | INTRAMUSCULAR | Status: AC
  Administered 2017-10-26: 50 mg via INTRAVENOUS

## 2017-10-26 MED ORDER — PALONOSETRON HCL INJECTION 0.25 MG/5ML
INTRAVENOUS | Status: AC
  Filled 2017-10-26: qty 5

## 2017-10-26 MED ORDER — PALONOSETRON HCL INJECTION 0.25 MG/5ML
0.2500 mg | Freq: Once | INTRAVENOUS | Status: AC
  Administered 2017-10-26: 0.25 mg via INTRAVENOUS

## 2017-10-26 MED ORDER — SODIUM CHLORIDE 0.9 % IV SOLN
Freq: Once | INTRAVENOUS | Status: AC
  Administered 2017-10-26: 12:00:00 via INTRAVENOUS
  Filled 2017-10-26: qty 5

## 2017-10-26 NOTE — Patient Instructions (Signed)
   [facility] Discharge Instructions for Patients Receiving Chemotherapy  Today you received the following chemotherapy agents Taxol and Carboplatin   To help prevent nausea and vomiting after your treatment, we encourage you to take your nausea medication as directed.    If you develop nausea and vomiting that is not controlled by your nausea medication, call the clinic.   BELOW ARE SYMPTOMS THAT SHOULD BE REPORTED IMMEDIATELY:  *FEVER GREATER THAN 100.5 F  *CHILLS WITH OR WITHOUT FEVER  NAUSEA AND VOMITING THAT IS NOT CONTROLLED WITH YOUR NAUSEA MEDICATION  *UNUSUAL SHORTNESS OF BREATH  *UNUSUAL BRUISING OR BLEEDING  TENDERNESS IN MOUTH AND THROAT WITH OR WITHOUT PRESENCE OF ULCERS  *URINARY PROBLEMS  *BOWEL PROBLEMS  UNUSUAL RASH Items with * indicate a potential emergency and should be followed up as soon as possible.  Feel free to call the clinic should you have any questions or concerns. The clinic phone number is [phone].  Please show the CHEMO ALERT CARD at check-in to the Emergency Department and triage nurse.   

## 2017-10-26 NOTE — Patient Instructions (Signed)
Implanted Port Home Guide An implanted port is a type of central line that is placed under the skin. Central lines are used to provide IV access when treatment or nutrition needs to be given through a person's veins. Implanted ports are used for long-term IV access. An implanted port may be placed because:  You need IV medicine that would be irritating to the small veins in your hands or arms.  You need long-term IV medicines, such as antibiotics.  You need IV nutrition for a long period.  You need frequent blood draws for lab tests.  You need dialysis.  Implanted ports are usually placed in the chest area, but they can also be placed in the upper arm, the abdomen, or the leg. An implanted port has two main parts:  Reservoir. The reservoir is round and will appear as a small, raised area under your skin. The reservoir is the part where a needle is inserted to give medicines or draw blood.  Catheter. The catheter is a thin, flexible tube that extends from the reservoir. The catheter is placed into a large vein. Medicine that is inserted into the reservoir goes into the catheter and then into the vein.  How will I care for my incision site? Do not get the incision site wet. Bathe or shower as directed by your health care provider. How is my port accessed? Special steps must be taken to access the port:  Before the port is accessed, a numbing cream can be placed on the skin. This helps numb the skin over the port site.  Your health care provider uses a sterile technique to access the port. ? Your health care provider must put on a mask and sterile gloves. ? The skin over your port is cleaned carefully with an antiseptic and allowed to dry. ? The port is gently pinched between sterile gloves, and a needle is inserted into the port.  Only "non-coring" port needles should be used to access the port. Once the port is accessed, a blood return should be checked. This helps ensure that the port  is in the vein and is not clogged.  If your port needs to remain accessed for a constant infusion, a clear (transparent) bandage will be placed over the needle site. The bandage and needle will need to be changed every week, or as directed by your health care provider.  Keep the bandage covering the needle clean and dry. Do not get it wet. Follow your health care provider's instructions on how to take a shower or bath while the port is accessed.  If your port does not need to stay accessed, no bandage is needed over the port.  What is flushing? Flushing helps keep the port from getting clogged. Follow your health care provider's instructions on how and when to flush the port. Ports are usually flushed with saline solution or a medicine called heparin. The need for flushing will depend on how the port is used.  If the port is used for intermittent medicines or blood draws, the port will need to be flushed: ? After medicines have been given. ? After blood has been drawn. ? As part of routine maintenance.  If a constant infusion is running, the port may not need to be flushed.  How long will my port stay implanted? The port can stay in for as long as your health care provider thinks it is needed. When it is time for the port to come out, surgery will be   done to remove it. The procedure is similar to the one performed when the port was put in. When should I seek immediate medical care? When you have an implanted port, you should seek immediate medical care if:  You notice a bad smell coming from the incision site.  You have swelling, redness, or drainage at the incision site.  You have more swelling or pain at the port site or the surrounding area.  You have a fever that is not controlled with medicine.  This information is not intended to replace advice given to you by your health care provider. Make sure you discuss any questions you have with your health care provider. Document  Released: 09/07/2005 Document Revised: 02/13/2016 Document Reviewed: 05/15/2013 Elsevier Interactive Patient Education  2017 [employer]  

## 2017-10-26 NOTE — Progress Notes (Signed)
[place] [phone]   [phone]  OFFICE PROGRESS NOTE  Theresa Eagles, PA-C [place] [address]  DIAGNOSIS: Metastatic non-small cell lung cancer initially diagnosed as Stage IIIA (T2b, N2, M0) non-small cell lung cancer, invasive poorly differentiated squamous cell carcinoma diagnosed in November 2016 and presented with large right hilar mass with collapse of the right middle lobe and extension into the right upper lobe and right lower lobe along the hilum with right paratracheal and subcarinal lymphadenopathy. She presented in July 2018 with metastatic intramuscular mass in the right posterior lateral chest wall, with biopsy confirmed squamous cell carcinoma. PDL1 Expression: 50%.  PRIOR THERAPY:  1) Concurrent chemoradiation with weekly carboplatin for AUC of 2 and paclitaxel 45 MG/M2. First dose 08/19/2015. Status post 5 cycles. Last cycle was given 10/07/2015 with significant response. 2) Consolidation chemotherapy with carboplatin for AUC of 5 and paclitaxel 175 MG/M2 every 3 weeks with Neulasta support. First dose 11/18/2015. Status post 3 cycles. 3) palliative radiotherapy to the intramuscular mass in the right posterior lateral chest wall under the care of Dr. Tammi Klippel. 4) Ketruda 200 mg IV every 3 weeks. First dose 05/12/2017. Status post 3 cycles.  This was discontinued secondary to disease progression.  CURRENT THERAPY: Systemic chemotherapy with carboplatin for AC of 5 and paclitaxel 175 mg/M2 every 3 weeks with Neulasta support.  First dose August 26, 2017.  Status post 3 cycles.   INTERVAL HISTORY: Theresa [name] 57 y.o. female returns to the clinic today for follow-up visit.  The patient is feeling fine today with no specific complaints.  She tolerated the last cycle of her treatment with carboplatin and paclitaxel fairly well.  She denied having any current chest pain, shortness of breath, cough or hemoptysis.  The pain  on the right scapular area has much improved and now she has more mobility of her right arm.  She denied having any recent weight loss or night sweats.  She has no fever or chills.  She denied having any nausea, vomiting, diarrhea or constipation.  She had repeat CT scan of the chest, abdomen and pelvis performed recently and she is here for discussion of the scan results before her treatment.   MEDICAL HISTORY: Past Medical History:  Diagnosis Date  . Cancer ([place])    5 years ago - cervical cancer  . Encounter for antineoplastic chemotherapy 08/27/2015  . History of radiation therapy 08/19/2015 - 10/07/2015   Site/dose:   The patient's primary tumor and involved lymph nodes were treated to 66 Gy in 30 fractions.  . Hypokalemia 09/30/2015  . Non-small cell carcinoma of lung, stage 3 ([facility]) 08/08/2015  . Paroxysmal atrial flutter ([facility]) 09/16/2015  . Pneumonia     ALLERGIES:  has No Known Allergies.  MEDICATIONS:  Current Outpatient Medications  Medication Sig Dispense Refill  . amoxicillin-clavulanate (AUGMENTIN) 875-125 MG tablet Take 1 tablet by mouth 2 (two) times daily. 20 tablet 0  . calcium-vitamin D 250-100 MG-UNIT tablet Take 1 tablet by mouth 2 (two) times daily.    . diphenoxylate-atropine (LOMOTIL) 2.5-0.025 MG tablet Take 2 tablets by mouth 4 (four) times daily as needed for diarrhea or loose stools. (Patient not taking: Reported on 09/15/2017) 60 tablet 1  . gabapentin (NEURONTIN) 100 MG capsule Take 2 capsules (200 mg total) by mouth 3 (three) times daily. 180 capsule 1  . lactulose (CHRONULAC) 10 GM/15ML solution Take 15 mLs (10 g total) by mouth 3 (three) times  daily as needed for mild constipation. (Patient not taking: Reported on 09/02/2017) 240 mL 3  . lidocaine-prilocaine (EMLA) cream Apply 1 application topically as needed. 30 g 2  . linaclotide (LINZESS) 145 MCG CAPS capsule Take 145 mcg by mouth daily before breakfast.    . LORazepam (ATIVAN) 0.5 MG tablet 0.5 mg  sublingual p.o. every 6 hours as needed for nausea and/or vomiting. (Patient not taking: Reported on 10/15/2017) 40 tablet 0  . magnesium hydroxide (MILK OF MAGNESIA) 400 MG/5ML suspension Take 5 mLs by mouth at bedtime as needed for mild constipation.    . Multiple Vitamin (MULTIVITAMIN) capsule Take 1 capsule daily by mouth. 30 capsule 2  . ondansetron (ZOFRAN-ODT) 8 MG disintegrating tablet Take 1 tablet (8 mg total) by mouth every 8 (eight) hours as needed for nausea or vomiting. 20 tablet 1  . oxyCODONE (OXY IR/ROXICODONE) 5 MG immediate release tablet Take 1 tablet (5 mg total) by mouth every 6 (six) hours as needed for severe pain. 60 tablet 0  . potassium chloride SA (K-DUR,KLOR-CON) 20 MEQ tablet Take 1 tablet twice a day for 5 days then 1 tablet daily. 35 tablet 0  . prochlorperazine (COMPAZINE) 10 MG tablet Take 1 tablet (10 mg total) by mouth every 6 (six) hours as needed for nausea or vomiting. 30 tablet 1   No current facility-administered medications for this visit.    Facility-Administered Medications Ordered in Other Visits  Medication Dose Route Frequency Provider Last Rate Last Dose  . 0.9 %  sodium chloride infusion   Intravenous Continuous Curcio, Kristin R, NP      . 0.9 %  sodium chloride infusion   Intravenous Continuous Curt Bears, MD   Stopped at 05/31/17 1714  . ondansetron (ZOFRAN) injection 8 mg  8 mg Intravenous Once Curt Bears, MD        SURGICAL HISTORY:  Past Surgical History:  Procedure Laterality Date  . IR FLUORO GUIDE PORT INSERTION RIGHT  05/21/2017  . IR US GUIDE VASC ACCESS RIGHT  05/21/2017  . TUBAL LIGATION    . VIDEO BRONCHOSCOPY WITH ENDOBRONCHIAL ULTRASOUND N/A 08/02/2015   Procedure: VIDEO BRONCHOSCOPY WITH ENDOBRONCHIAL ULTRASOUND;  Surgeon: [name], MD;  Location: [facility];  Service: Thoracic;  Laterality: N/A;  . WISDOM TOOTH EXTRACTION      REVIEW OF SYSTEMS:  Constitutional: negative Eyes: negative Ears, nose, mouth,  throat, and face: negative Respiratory: negative Cardiovascular: negative Gastrointestinal: negative Genitourinary:negative Integument/breast: negative Hematologic/lymphatic: negative Musculoskeletal:negative Neurological: negative Behavioral/Psych: negative Endocrine: negative Allergic/Immunologic: negative   PHYSICAL EXAMINATION: General appearance: alert, cooperative, fatigued and no distress Head: Normocephalic, without obvious abnormality, atraumatic Neck: no adenopathy, no JVD, supple, symmetrical, trachea midline and thyroid not enlarged, symmetric, no tenderness/mass/nodules Lymph nodes: Cervical, supraclavicular, and axillary nodes normal. Resp: clear to auscultation bilaterally Back: symmetric, no curvature. ROM normal. No CVA tenderness. Cardio: regular rate and rhythm, S1, S2 normal, no murmur, click, rub or gallop GI: soft, non-tender; bowel sounds normal; no masses,  no organomegaly Extremities: extremities normal, atraumatic, no cyanosis or edema Neurologic: Alert and oriented X 3, normal strength and tone. Normal symmetric reflexes. Normal coordination and gait  Examination of the back showed a palpable mass above the right scapula.  ECOG PERFORMANCE STATUS: 1 - Symptomatic but completely ambulatory  Blood pressure 127/73, pulse (!) 118, temperature 98.6 F (37 C), temperature source Oral, resp. rate 18, height '5\' 8"'  (1.727 m), weight 209 lb 9.6 oz (95.1 kg), SpO2 100 %.  LABORATORY DATA: Lab  Results  Component Value Date   WBC 3.5 (L) 10/26/2017   HGB 10.0 (L) 10/26/2017   HCT 29.7 (L) 10/26/2017   MCV 95.3 10/26/2017   PLT 140 (L) 10/26/2017      Chemistry      Component Value Date/Time   NA 140 10/26/2017 0941   NA 138 09/24/2017 1600   K 3.2 (L) 10/26/2017 0941   K 3.4 (L) 09/24/2017 1600   CL 106 10/26/2017 0941   CO2 25 10/26/2017 0941   CO2 25 09/24/2017 1600   BUN 9 10/26/2017 0941   BUN 9.5 09/24/2017 1600   CREATININE 0.78 10/26/2017 0941     CREATININE 0.8 09/24/2017 1600      Component Value Date/Time   CALCIUM 9.1 10/26/2017 0941   CALCIUM 9.0 09/24/2017 1600   ALKPHOS 99 10/26/2017 0941   ALKPHOS 92 09/24/2017 1600   AST 26 10/26/2017 0941   AST 31 09/24/2017 1600   ALT 13 10/26/2017 0941   ALT 24 09/24/2017 1600   BILITOT 0.4 10/26/2017 0941   BILITOT 0.40 09/24/2017 1600       RADIOGRAPHIC STUDIES: Ct Chest W Contrast  Result Date: 10/25/2017 CLINICAL DATA:  Non-small cell lung cancer, restaging, ongoing chemotherapy. EXAM: CT CHEST, ABDOMEN, AND PELVIS WITH CONTRAST TECHNIQUE: Multidetector CT imaging of the chest, abdomen and pelvis was performed following the standard protocol during bolus administration of intravenous contrast. CONTRAST:  100 cc Isovue-300. COMPARISON:  PET 05/05/2017 and CT chest 04/19/2017. CT abdomen pelvis 03/11/2017. FINDINGS: CT CHEST FINDINGS Cardiovascular: Right IJ Port-A-Cath terminates in the right atrium. Aberrant right subclavian artery. Heart size normal. No pericardial effusion. Mediastinum/Nodes: No pathologically enlarged mediastinal, hilar or axillary lymph nodes. Esophagus is grossly unremarkable. Lungs/Pleura: Large right pleural effusion, increased from 05/05/2017, with post radiation changes in the medial right hemithorax. New hyperattenuating extrapleural thickening in the posterior right hemithorax, at the level of the right ninth and tenth ribs. Patchy peribronchovascular consolidation and ground-glass in the medial left lower lobe, new. No left pleural fluid. Airway is otherwise unremarkable. Musculoskeletal: Slight lytic destruction of the posterior right ninth rib, new. Large posterior right chest wall mass measures 4.8 x 9.7 cm, grossly stable from 05/05/2017. A 2.0 x 3.0 cm soft tissue mass lateral to the inferior right scapula (series 2, image 23) appears new from 04/19/2017. Mildly hyperattenuating soft tissue nodules are seen posterior to the right ninth and tenth  intercostal space, with hyperattenuating soft tissue extending into the intercostal region and likely extrapleural space, new or better visualized on the current exam. CT ABDOMEN PELVIS FINDINGS Hepatobiliary: Low-attenuation lesions in the liver measure up to 10 mm in the dome, as before. Liver and gallbladder are otherwise unremarkable. No biliary ductal dilatation. Pancreas: Negative. Spleen: Negative. Adrenals/Urinary Tract: Adrenal glands and kidneys are unremarkable. Ureters are decompressed. Bladder is grossly unremarkable. Stomach/Bowel: Stomach is decompressed. Small bowel and appendix are unremarkable. Stool is seen in the majority of the colon, indicative of constipation. Colon is otherwise unremarkable. Vascular/Lymphatic: Atherosclerotic calcification of the arterial vasculature without abdominal aortic aneurysm. No pathologically enlarged lymph nodes. Reproductive: Uterus is visualized. Fat containing lesion in the right adnexa measures up to 5.8 cm, slightly enlarged from 4.9 cm on 03/11/2017. Other: No free fluid.  Mesenteries and peritoneum are unremarkable. Musculoskeletal: No additional worrisome lytic or sclerotic lesions. IMPRESSION: 1. Progressive right posterior chest wall metastatic disease with probable infiltration of the right 9/10 intercostal space, into the posterior extrapleural space of the right hemithorax. 2. New lytic  destruction of the posterior right ninth rib. 3. Large right pleural effusion, markedly increased from prior. 4. New peribronchovascular ground-glass and consolidation in the left lower lobe, likely infectious or inflammatory in etiology. 5.  Aortic atherosclerosis (ICD10-170.0). 6. Fat containing lesion in the right adnexa measures slightly larger than on 03/11/2017 and may represent an ovarian dermoid. Electronically Signed   By: Lorin Picket M.D.   On: 10/25/2017 12:57   Ct Abdomen Pelvis W Contrast  Result Date: 10/25/2017 CLINICAL DATA:  Non-small cell lung  cancer, restaging, ongoing chemotherapy. EXAM: CT CHEST, ABDOMEN, AND PELVIS WITH CONTRAST TECHNIQUE: Multidetector CT imaging of the chest, abdomen and pelvis was performed following the standard protocol during bolus administration of intravenous contrast. CONTRAST:  100 cc Isovue-300. COMPARISON:  PET 05/05/2017 and CT chest 04/19/2017. CT abdomen pelvis 03/11/2017. FINDINGS: CT CHEST FINDINGS Cardiovascular: Right IJ Port-A-Cath terminates in the right atrium. Aberrant right subclavian artery. Heart size normal. No pericardial effusion. Mediastinum/Nodes: No pathologically enlarged mediastinal, hilar or axillary lymph nodes. Esophagus is grossly unremarkable. Lungs/Pleura: Large right pleural effusion, increased from 05/05/2017, with post radiation changes in the medial right hemithorax. New hyperattenuating extrapleural thickening in the posterior right hemithorax, at the level of the right ninth and tenth ribs. Patchy peribronchovascular consolidation and ground-glass in the medial left lower lobe, new. No left pleural fluid. Airway is otherwise unremarkable. Musculoskeletal: Slight lytic destruction of the posterior right ninth rib, new. Large posterior right chest wall mass measures 4.8 x 9.7 cm, grossly stable from 05/05/2017. A 2.0 x 3.0 cm soft tissue mass lateral to the inferior right scapula (series 2, image 23) appears new from 04/19/2017. Mildly hyperattenuating soft tissue nodules are seen posterior to the right ninth and tenth intercostal space, with hyperattenuating soft tissue extending into the intercostal region and likely extrapleural space, new or better visualized on the current exam. CT ABDOMEN PELVIS FINDINGS Hepatobiliary: Low-attenuation lesions in the liver measure up to 10 mm in the dome, as before. Liver and gallbladder are otherwise unremarkable. No biliary ductal dilatation. Pancreas: Negative. Spleen: Negative. Adrenals/Urinary Tract: Adrenal glands and kidneys are unremarkable.  Ureters are decompressed. Bladder is grossly unremarkable. Stomach/Bowel: Stomach is decompressed. Small bowel and appendix are unremarkable. Stool is seen in the majority of the colon, indicative of constipation. Colon is otherwise unremarkable. Vascular/Lymphatic: Atherosclerotic calcification of the arterial vasculature without abdominal aortic aneurysm. No pathologically enlarged lymph nodes. Reproductive: Uterus is visualized. Fat containing lesion in the right adnexa measures up to 5.8 cm, slightly enlarged from 4.9 cm on 03/11/2017. Other: No free fluid.  Mesenteries and peritoneum are unremarkable. Musculoskeletal: No additional worrisome lytic or sclerotic lesions. IMPRESSION: 1. Progressive right posterior chest wall metastatic disease with probable infiltration of the right 9/10 intercostal space, into the posterior extrapleural space of the right hemithorax. 2. New lytic destruction of the posterior right ninth rib. 3. Large right pleural effusion, markedly increased from prior. 4. New peribronchovascular ground-glass and consolidation in the left lower lobe, likely infectious or inflammatory in etiology. 5.  Aortic atherosclerosis (ICD10-170.0). 6. Fat containing lesion in the right adnexa measures slightly larger than on 03/11/2017 and may represent an ovarian dermoid. Electronically Signed   By: Lorin Picket M.D.   On: 10/25/2017 12:57    ASSESSMENT AND PLAN:  This is a very pleasant 57 years old African-American female with metastatic non-small cell lung cancer that was initially diagnosed as stage IIIa non-small cell lung cancer, squamous cell carcinoma presented with large right hilar mass and mediastinal lymphadenopathy  diagnosed in November 2016. She status post concurrent chemoradiation followed by consolidation chemotherapy. The patient was found recently to have intramuscular metastases and the right posterior chest wall that was biopsy-proven to be squamous cell carcinoma with PDL 1  expression 50%. The patient was a started on treatment with immunotherapy with Ketruda (pembrolizumab) status post 3 cycles. Unfortunately imaging studies after the treatment with Keytruda showed evidence for disease progression. The patient is currently undergoing treatment with carboplatin for [facility] of 5 and paclitaxel 175 mg/M2 status post 3 cycles.  She continues to tolerate this treatment very well especially after reducing the dose of paclitaxel to 150 mg/M2 starting from cycle #2. She had repeat CT scan of the chest, abdomen and pelvis.  I personally and independently reviewed the scan images and discussed the results and showed the images to the patient today.  Her scan showed a stable disease except for the new lytic destruction of the posterior right ninth rib which could be related to her previous radiotherapy. I recommended for the patient to continue her current treatment with carboplatin and paclitaxel and she will proceed with cycle #4 today. For the peripheral neuropathy, I will start the patient on Neurontin 100 mg p.o. 3 times daily. I will see her back for follow-up visit in 3 weeks for evaluation before starting cycle #5. The patient was advised to call immediately if she has any concerning symptoms in the interval. The patient voices understanding of current disease status and treatment options and is in agreement with the current care plan. All questions were answered. The patient knows to call the clinic with any problems, questions or concerns. We can certainly see the patient much sooner if necessary.  Disclaimer: This note was dictated with voice recognition software. Similar sounding words can inadvertently be transcribed and may not be corrected upon review.

## 2017-10-27 ENCOUNTER — Other Ambulatory Visit: Payer: Self-pay | Admitting: Oncology

## 2017-10-27 ENCOUNTER — Telehealth: Payer: Self-pay | Admitting: Internal Medicine

## 2017-10-27 ENCOUNTER — Telehealth: Payer: Self-pay

## 2017-10-27 MED ORDER — POTASSIUM CHLORIDE CRYS ER 20 MEQ PO TBCR: EXTENDED_RELEASE_TABLET | ORAL | 0 refills | Status: DC

## 2017-10-27 NOTE — Telephone Encounter (Signed)
Kdur 20 meq was called in for patient at [facility] on [address] d/t potassium 3.2 per [place].  Spoke with patient to inform of Rx and gave instructions on how to take med.  Patient voiced understanding.  No questions or concerns at this time.  Knows to call center for any issues.

## 2017-10-27 NOTE — Telephone Encounter (Signed)
All appts already scheduled per 2/5 los - no additional appts added.

## 2017-10-29 ENCOUNTER — Telehealth: Payer: Self-pay | Admitting: *Deleted

## 2017-10-29 NOTE — Telephone Encounter (Signed)
Received call from pt stating that she saw Dr Julien Nordmann recently & was told that she had some fluid in her chest & she wants to have this drained.  She wants to know if this can be done Tues with her lab/flush appt.  She is having some SOB.  Message to Abelina Bachelor RN.  Call back # is [phone]

## 2017-11-01 ENCOUNTER — Observation Stay (HOSPITAL_COMMUNITY): Payer: Medicare Other

## 2017-11-01 ENCOUNTER — Other Ambulatory Visit: Payer: Self-pay

## 2017-11-01 ENCOUNTER — Encounter (HOSPITAL_COMMUNITY): Payer: Self-pay | Admitting: *Deleted

## 2017-11-01 ENCOUNTER — Inpatient Hospital Stay (HOSPITAL_BASED_OUTPATIENT_CLINIC_OR_DEPARTMENT_OTHER): Payer: Medicare Other | Admitting: Medical

## 2017-11-01 ENCOUNTER — Telehealth: Payer: Self-pay | Admitting: *Deleted

## 2017-11-01 ENCOUNTER — Inpatient Hospital Stay: Payer: Medicare Other

## 2017-11-01 ENCOUNTER — Inpatient Hospital Stay (HOSPITAL_COMMUNITY)
Admission: AD | Admit: 2017-11-01 | Discharge: 2017-11-04 | DRG: 391 | Disposition: A | Discharge status: Home / Self Care | Payer: Medicare Other | Source: Ambulatory Visit | Attending: Family Medicine | Admitting: Family Medicine

## 2017-11-01 ENCOUNTER — Other Ambulatory Visit: Payer: Self-pay | Admitting: *Deleted

## 2017-11-01 VITALS — BP 117/56 | HR 125 | Temp 99.7°F | Resp 18 | Ht 68.0 in

## 2017-11-01 DIAGNOSIS — R059 Cough, unspecified: Secondary | ICD-10-CM

## 2017-11-01 DIAGNOSIS — Z8541 Personal history of malignant neoplasm of cervix uteri: Secondary | ICD-10-CM

## 2017-11-01 DIAGNOSIS — C342 Malignant neoplasm of middle lobe, bronchus or lung: Secondary | ICD-10-CM

## 2017-11-01 DIAGNOSIS — C3481 Malignant neoplasm of overlapping sites of right bronchus and lung: Secondary | ICD-10-CM

## 2017-11-01 DIAGNOSIS — R112 Nausea with vomiting, unspecified: Secondary | ICD-10-CM | POA: Diagnosis not present

## 2017-11-01 DIAGNOSIS — J101 Influenza due to other identified influenza virus with other respiratory manifestations: Secondary | ICD-10-CM | POA: Diagnosis present

## 2017-11-01 DIAGNOSIS — Z87891 Personal history of nicotine dependence: Secondary | ICD-10-CM

## 2017-11-01 DIAGNOSIS — R05 Cough: Secondary | ICD-10-CM | POA: Diagnosis not present

## 2017-11-01 DIAGNOSIS — T451X5A Adverse effect of antineoplastic and immunosuppressive drugs, initial encounter: Secondary | ICD-10-CM

## 2017-11-01 DIAGNOSIS — D61810 Antineoplastic chemotherapy induced pancytopenia: Secondary | ICD-10-CM | POA: Diagnosis not present

## 2017-11-01 DIAGNOSIS — E876 Hypokalemia: Secondary | ICD-10-CM | POA: Diagnosis not present

## 2017-11-01 DIAGNOSIS — E860 Dehydration: Secondary | ICD-10-CM | POA: Diagnosis present

## 2017-11-01 DIAGNOSIS — C7989 Secondary malignant neoplasm of other specified sites: Secondary | ICD-10-CM | POA: Diagnosis present

## 2017-11-01 DIAGNOSIS — Z79899 Other long term (current) drug therapy: Secondary | ICD-10-CM

## 2017-11-01 DIAGNOSIS — D701 Agranulocytosis secondary to cancer chemotherapy: Secondary | ICD-10-CM | POA: Diagnosis not present

## 2017-11-01 DIAGNOSIS — J90 Pleural effusion, not elsewhere classified: Secondary | ICD-10-CM | POA: Diagnosis present

## 2017-11-01 DIAGNOSIS — C3490 Malignant neoplasm of unspecified part of unspecified bronchus or lung: Secondary | ICD-10-CM | POA: Diagnosis present

## 2017-11-01 DIAGNOSIS — C3491 Malignant neoplasm of unspecified part of right bronchus or lung: Secondary | ICD-10-CM

## 2017-11-01 LAB — CMP (CANCER CENTER ONLY)
ALBUMIN: 3.3 g/dL — AB (ref 3.5–5.0)
ALK PHOS: 80 U/L (ref 40–150)
ALT: 15 U/L (ref 0–55)
ANION GAP: 9 (ref 3–11)
AST: 41 U/L — ABNORMAL HIGH (ref 5–34)
BILIRUBIN TOTAL: 1 mg/dL (ref 0.2–1.2)
BUN: 9 mg/dL (ref 7–26)
CALCIUM: 8.5 mg/dL (ref 8.4–10.4)
CO2: 23 mmol/L (ref 22–29)
Chloride: 102 mmol/L (ref 98–109)
Creatinine: 0.82 mg/dL (ref 0.60–1.10)
GFR, Est AFR Am: >60 mL/min (ref >60)
GFR, Estimated: >60 mL/min (ref >60)
GLUCOSE: 127 mg/dL (ref 70–140)
Potassium: 3.6 mmol/L (ref 3.5–5.1)
Sodium: 134 mmol/L — ABNORMAL LOW (ref 136–145)
TOTAL PROTEIN: 7.1 g/dL (ref 6.4–8.3)

## 2017-11-01 LAB — CBC WITH DIFFERENTIAL (CANCER CENTER ONLY)
BASOS PCT: 1 %
Basophils Absolute: 0 10*3/uL (ref 0.0–0.1)
EOS ABS: 0 10*3/uL (ref 0.0–0.5)
EOS PCT: 0 %
HEMATOCRIT: 32.1 % — AB (ref 34.8–46.6)
Hemoglobin: 10.5 g/dL — ABNORMAL LOW (ref 11.6–15.9)
Lymphocytes Relative: 25 %
Lymphs Abs: 0.2 10*3/uL — ABNORMAL LOW (ref 0.9–3.3)
MCH: 31.5 pg (ref 25.1–34.0)
MCHC: 32.8 g/dL (ref 31.5–36.0)
MCV: 96.1 fL (ref 79.5–101.0)
MONO ABS: 0.1 10*3/uL (ref 0.1–0.9)
MONOS PCT: 10 %
NEUTROS ABS: 0.6 10*3/uL — AB (ref 1.5–6.5)
Neutrophils Relative %: 64 %
PLATELETS: 77 10*3/uL — AB (ref 145–400)
RBC: 3.34 MIL/uL — ABNORMAL LOW (ref 3.70–5.45)
RDW: 22.2 % — AB (ref 11.2–14.5)
WBC Count: 1 10*3/uL — ABNORMAL LOW (ref 3.9–10.3)

## 2017-11-01 LAB — COMPREHENSIVE METABOLIC PANEL
ALT: 19 U/L (ref 0–55)
ANION GAP: 12 — AB (ref 3–11)
AST: 45 U/L — ABNORMAL HIGH (ref 5–34)
Albumin: 3.7 g/dL (ref 3.5–5.0)
Alkaline Phosphatase: 91 U/L (ref 40–150)
BUN: 10 mg/dL (ref 7–26)
CHLORIDE: 97 mmol/L — AB (ref 98–109)
CO2: 23 mmol/L (ref 22–29)
Calcium: 9.1 mg/dL (ref 8.4–10.4)
Creatinine, Ser: 0.81 mg/dL (ref 0.60–1.10)
Glucose, Bld: 117 mg/dL (ref 70–140)
POTASSIUM: 3.6 mmol/L (ref 3.5–5.1)
Sodium: 132 mmol/L — ABNORMAL LOW (ref 136–145)
Total Bilirubin: 1.2 mg/dL (ref 0.2–1.2)
Total Protein: 8 g/dL (ref 6.4–8.3)

## 2017-11-01 MED ORDER — OSELTAMIVIR PHOSPHATE 75 MG PO CAPS
75.0000 mg | ORAL_CAPSULE | Freq: Two times a day (BID) | ORAL | Status: DC
  Administered 2017-11-01 – 2017-11-04 (×6): 75 mg via ORAL
  Filled 2017-11-01 (×7): qty 1

## 2017-11-01 MED ORDER — SODIUM CHLORIDE 0.9 % IV SOLN
Freq: Once | INTRAVENOUS | Status: AC
  Administered 2017-11-01: 14:00:00 via INTRAVENOUS
  Filled 2017-11-01: qty 8

## 2017-11-01 MED ORDER — LORAZEPAM 2 MG/ML IJ SOLN
0.5000 mg | Freq: Once | INTRAMUSCULAR | Status: AC
  Administered 2017-11-01: 0.5 mg via INTRAVENOUS

## 2017-11-01 MED ORDER — LORAZEPAM 2 MG/ML IJ SOLN
INTRAMUSCULAR | Status: AC
  Filled 2017-11-01: qty 1

## 2017-11-01 MED ORDER — ONDANSETRON HCL 4 MG/2ML IJ SOLN
4.0000 mg | Freq: Four times a day (QID) | INTRAMUSCULAR | Status: DC | PRN
  Administered 2017-11-02 – 2017-11-04 (×3): 4 mg via INTRAVENOUS
  Filled 2017-11-01 (×3): qty 2

## 2017-11-01 MED ORDER — SODIUM CHLORIDE 0.9 % IV SOLN: 2.0000 g | Freq: Two times a day (BID) | INTRAVENOUS | Status: DC

## 2017-11-01 MED ORDER — POTASSIUM CHLORIDE CRYS ER 10 MEQ PO TBCR
10.0000 meq | EXTENDED_RELEASE_TABLET | Freq: Every day | ORAL | Status: DC
  Administered 2017-11-01 – 2017-11-04 (×4): 10 meq via ORAL
  Filled 2017-11-01 (×4): qty 1

## 2017-11-01 MED ORDER — GABAPENTIN 100 MG PO CAPS
200.0000 mg | ORAL_CAPSULE | Freq: Three times a day (TID) | ORAL | Status: DC
  Administered 2017-11-01 – 2017-11-03 (×5): 200 mg via ORAL
  Filled 2017-11-01 (×7): qty 2

## 2017-11-01 MED ORDER — SODIUM CHLORIDE 0.9% FLUSH
10.0000 mL | INTRAVENOUS | Status: DC | PRN
  Administered 2017-11-03: 10 mL
  Administered 2017-11-04: 20 mL
  Filled 2017-11-01 (×2): qty 40

## 2017-11-01 MED ORDER — ONDANSETRON HCL 4 MG PO TABS: 4.0000 mg | ORAL_TABLET | Freq: Four times a day (QID) | ORAL | Status: DC | PRN

## 2017-11-01 MED ORDER — OXYCODONE HCL 5 MG PO TABS
5.0000 mg | ORAL_TABLET | Freq: Four times a day (QID) | ORAL | Status: DC | PRN
  Administered 2017-11-01 – 2017-11-03 (×4): 5 mg via ORAL
  Filled 2017-11-01 (×5): qty 1

## 2017-11-01 MED ORDER — SODIUM CHLORIDE 0.9 % IV SOLN
Freq: Once | INTRAVENOUS | Status: AC
  Administered 2017-11-01: 13:00:00 via INTRAVENOUS

## 2017-11-01 MED ORDER — SODIUM CHLORIDE 0.9 % IV SOLN
INTRAVENOUS | Status: AC
  Administered 2017-11-01 – 2017-11-02 (×2): via INTRAVENOUS

## 2017-11-01 MED ORDER — PROCHLORPERAZINE MALEATE 10 MG PO TABS
10.0000 mg | ORAL_TABLET | Freq: Four times a day (QID) | ORAL | Status: DC | PRN
  Administered 2017-11-01: 10 mg via ORAL
  Filled 2017-11-01: qty 1

## 2017-11-01 NOTE — Telephone Encounter (Signed)
"  I've been vomiting all weekend and today.  Every time I tried to eat, I threw up.  Using Zofran every six hours.  I'm out if ativan.  Bowels started today.  Three bowel movements so far.  I can't wait until tomorrow's lab appointment.  I need IVF today."  Verbal order received and tread back from Dr, Julien Nordmann for patient to be seen today by [facility] Patient says she should be able to arrive at 11:30 am.  Scheduling order placed.

## 2017-11-01 NOTE — Progress Notes (Signed)
Pt being seen in [facility] for nausea and vomiting. Received decadron and zofran with some relief @ 1330.  Pt started vomiting /dry heaves again @ 1315. Spoke with Sandi Mealy, PA.Marland Kitchen Received order for IV Ativan 0.5 mg.  Pt resting after. Remains tachycardic despite 1 liter of fluid.  See flowsheet for VS  To bathroom via w/c and 2 person assist.  Voided and had bm.  Blood cultures done x2  One set peripherally and one set done via port.  To be admitted to 1410. Report called to [place], RN. Transported via w/c with her Aunt in [place].  Port flushed and re-dressed with biopatch and sorbaview dressing.

## 2017-11-01 NOTE — Progress Notes (Signed)
[facility] Progress Note   RONAE NOELL [id number] 1961-05-13 57 y.o.  Theresa [name] is managed by Dr. Eilleen Kempf  Actively treated with chemotherapy: yes  Current Therapy: carboplatin and paclitaxel every 3 weeks with Neulasta support  Last Treated: 10/26/2017 (cycle 4, day 1)  Assessment: Plan:    Nausea and vomiting, intractability of vomiting not specified, unspecified vomiting type - Plan: 0.9 %  sodium chloride infusion, ondansetron (ZOFRAN) 16 mg, dexamethasone (DECADRON) 20 mg in sodium chloride 0.9 % 50 mL IVPB, LORazepam (ATIVAN) injection 0.5 mg  Metastatic primary lung cancer, right ([place]) - Plan: LORazepam (ATIVAN) injection 0.5 mg  Chemotherapy-induced neutropenia ([place]) - Plan: Culture, Blood, Culture, Blood, DISCONTINUED: ceFEPIme (MAXIPIME) 2 g in sodium chloride 0.9 % 100 mL IVPB   Nausea and vomiting: Patient is given 1 L of normal saline.  Additionally she has been given Zofran 60 mg IV and Decadron 20 mg IV.  Dr. Sheran Fava graciously accepted the patient for direct admission to 4 E.  Metastatic primary lung cancer: Patient is status post cycle 4, day 1 of carboplatin and paclitaxel with Neulasta support.  Chemotherapy-induced neutropenia: Blood cultures x2 were collected.  Initially the patient was going to receive cefepime 2 g IV however that was deferred until the patient had a urine culture and urinalysis completed when she arrived on the floor.  A CBC was completed at the time of the patient's visit today with a WBC returning at 1.0 and an [place] returning at 0.6.  Patient's temperature was noted to be at 99.7 today.  The patient denied any fever this weekend.  She received Neulasta after her last chemotherapy on 10/26/2017.  Please see After Visit Summary for patient specific instructions.  Future Appointments  Date Time Provider [place]  11/02/2017 12:45 PM [facility] LAB 4 [facility] None  11/02/2017  1:00 PM [facility]  INJ NURSE [facility] None  11/09/2017 11:15 AM [facility] LAB 2 [facility] None  11/09/2017 11:30 AM [facility] FLUSH NURSE [facility] None  11/16/2017  9:30 AM [facility] LAB 1 [facility] None  11/16/2017  9:45 AM [facility] INJ NURSE [facility] None  11/16/2017 10:15 AM Curt Bears, MD [facility] None  11/16/2017 11:00 AM [facility] G24 [facility] None  11/23/2017 11:45 AM [facility] LAB 1 [facility] None  11/23/2017 12:00 PM [facility] FLUSH NURSE 2 [facility] None  11/30/2017 12:15 PM [facility] LAB 1 [facility] None  11/30/2017 12:30 PM [facility] FLUSH NURSE 2 [facility] None  12/07/2017  8:30 AM [facility] LAB 2 [facility] None  12/07/2017  8:45 AM [facility] INJ NURSE [facility] None  12/07/2017  9:15 AM Curt Bears, MD [facility] None  12/07/2017 10:15 AM [facility] H29 [facility] None    Orders Placed This Encounter  Procedures  . Culture, Blood  . Culture, Blood       Subjective:   Patient ID:  JANNY CRUTE is a 57 y.o. (DOB 10/09/1960) female.  Chief Complaint:  Chief Complaint  Patient presents with  . Nausea    wirth vomiting    HPI Theresa [name] is a 57 year old female with a metastatic non-small cell lung cancer which was originally diagnosed in November 2016.  The patient's original diagnosis was that of a stage IIIa (T2b, and 2, M0) non-small cell lung cancer, invasive poorly differentiated squamous cell carcinoma.  She presented with a large right hilar mass with collapse of the right middle lobe and extension into the right upper lobe and right lower lobe along the hilum with right paratracheal and  subcarinal lymphadenopathy.  In July 2018 she presented with a metastatic intramuscular mass in the right posterior lateral chest wall.  A biopsy was completed and returned positive for squamous cell carcinoma.  She is currently treated with carboplatin and paclitaxel every 3 weeks with Neulasta support.  She was first dosed with this on  08/26/2017.  Her last chemotherapy treatment was given on 10/26/2017.  She contacted our office this morning stating that she had been vomiting since this weekend.  She stated that each time she attempted to eat she vomited.  She is using Zofran every 6 hours.  She had previously used Ativan but was out of her prescription.  She is also had 3 bowel movements this morning.  She was scheduled to have labs tomorrow but stated that she could not wait that long and thought that she needed to be seen today for possible IV fluids.  Medications: I have reviewed the patient's current medications.  Allergies: No Known Allergies  Past Medical History:  Diagnosis Date  . Cancer ([place])    5 years ago - cervical cancer  . Encounter for antineoplastic chemotherapy 08/27/2015  . History of radiation therapy 08/19/2015 - 10/07/2015   Site/dose:   The patient's primary tumor and involved lymph nodes were treated to 66 Gy in 30 fractions.  . Hypokalemia 09/30/2015  . Non-small cell carcinoma of lung, stage 3 ([place]) 08/08/2015  . Paroxysmal atrial flutter ([place]) 09/16/2015  . Pneumonia     Past Surgical History:  Procedure Laterality Date  . IR FLUORO GUIDE PORT INSERTION RIGHT  05/21/2017  . IR US GUIDE VASC ACCESS RIGHT  05/21/2017  . TUBAL LIGATION    . VIDEO BRONCHOSCOPY WITH ENDOBRONCHIAL ULTRASOUND N/A 08/02/2015   Procedure: VIDEO BRONCHOSCOPY WITH ENDOBRONCHIAL ULTRASOUND;  Surgeon: [name], MD;  Location[facility];  Service: Thoracic;  Laterality: N/A;  . WISDOM TOOTH EXTRACTION      Family History  Problem Relation Age of Onset  . COPD Mother   . Diabetes type II Mother   . Heart disease Mother   . High Cholesterol Mother   . Lung cancer Father     Social History   Socioeconomic History  . Marital status: Single    Spouse name: Not on file  . Number of children: Not on file  . Years of education: Not on file  . Highest education level: Not on file  Social Needs  . Financial  resource strain: Not on file  . Food insecurity - worry: Not on file  . Food insecurity - inability: Not on file  . Transportation needs - medical: Not on file  . Transportation needs - non-medical: Not on file  Occupational History  . Not on file  Tobacco Use  . Smoking status: Former Smoker    Packs/day: 0.30    Years: 5.00    Pack years: 1.50    Types: Cigarettes    Last attempt to quit: 07/31/1990    Years since quitting: 27.2  . Smokeless tobacco: Never Used  Substance and Sexual Activity  . Alcohol use: No    Alcohol/week: 0.0 oz  . Drug use: No  . Sexual activity: Not Currently  Other Topics Concern  . Not on file  Social History Narrative  . Not on file    Past Medical History, Surgical history, Social history, and Family history were reviewed and updated as appropriate.   Please see review of systems for further details on the patient's review from  today.   Review of Systems:  Review of Systems  Constitutional: Positive for appetite change and fatigue. Negative for chills, diaphoresis and fever.  HENT: Negative for trouble swallowing.   Respiratory: Positive for cough. Negative for choking, chest tightness, shortness of breath and wheezing.   Cardiovascular: Positive for palpitations. Negative for chest pain.  Gastrointestinal: Positive for diarrhea (Minimal diarrhea), nausea and vomiting. Negative for abdominal distention, abdominal pain and constipation.  Neurological: Negative for headaches.    Objective:   Physical Exam:  BP (!) 117/56 (BP Location: Right Arm, Patient Position: Supine)   Pulse (!) 125   Temp 99.7 F (37.6 C) (Oral)   Resp 18   Ht 5\' 8"  (1.727 m)   SpO2 98%   BMI 31.87 kg/m  ECOG: 1  Physical Exam  Constitutional: She appears distressed.  HENT:  Head: Normocephalic and atraumatic.  Mouth/Throat: Mucous membranes are dry. No oropharyngeal exudate or posterior oropharyngeal erythema.  Neck: Normal range of motion. Neck supple. No  tracheal deviation present.  Cardiovascular: S1 normal and S2 normal. Tachycardia present.  Pulmonary/Chest: Effort normal. No respiratory distress. She has no wheezes. She has no rales.      Abdominal: Soft. Bowel sounds are normal. She exhibits no distension and no mass. There is no tenderness. There is no rebound and no guarding.  Lymphadenopathy:    She has no cervical adenopathy.  Neurological: She is alert.  Skin: Skin is warm and dry. No rash noted. She is not diaphoretic. No erythema.    Lab Review:     Component Value Date/Time   NA 134 (L) 11/01/2017 1613   NA 138 09/24/2017 1600   K 3.6 11/01/2017 1613   K 3.4 (L) 09/24/2017 1600   CL 102 11/01/2017 1613   CO2 23 11/01/2017 1613   CO2 25 09/24/2017 1600   GLUCOSE 127 11/01/2017 1613   GLUCOSE 125 09/24/2017 1600   BUN 9 11/01/2017 1613   BUN 9.5 09/24/2017 1600   CREATININE 0.82 11/01/2017 1613   CREATININE 0.8 09/24/2017 1600   CALCIUM 8.5 11/01/2017 1613   CALCIUM 9.0 09/24/2017 1600   PROT 7.1 11/01/2017 1613   PROT 7.4 09/24/2017 1600   ALBUMIN 3.3 (L) 11/01/2017 1613   ALBUMIN 3.5 09/24/2017 1600   AST 41 (H) 11/01/2017 1613   AST 31 09/24/2017 1600   ALT 15 11/01/2017 1613   ALT 24 09/24/2017 1600   ALKPHOS 80 11/01/2017 1613   ALKPHOS 92 09/24/2017 1600   BILITOT 1.0 11/01/2017 1613   BILITOT 0.40 09/24/2017 1600   GFRNONAA >60 11/01/2017 1613   GFRAA >60 11/01/2017 1613       Component Value Date/Time   WBC 1.0 (L) 11/01/2017 1255   WBC 3.5 (L) 10/26/2017 0941   RBC 3.34 (L) 11/01/2017 1255   HGB 10.0 (L) 10/26/2017 0941   HGB 8.3 (L) 09/24/2017 1601   HCT 32.1 (L) 11/01/2017 1255   HCT 24.5 (L) 09/24/2017 1601   PLT 77 (L) 11/01/2017 1255   PLT 92 (L) 09/24/2017 1601   PLT 300 02/11/2017 1632   MCV 96.1 11/01/2017 1255   MCV 90.7 09/24/2017 1601   MCH 31.5 11/01/2017 1255   MCHC 32.8 11/01/2017 1255   RDW 22.2 (H) 11/01/2017 1255   RDW 18.7 (H) 09/24/2017 1601   LYMPHSABS 0.2 (L)  11/01/2017 1255   LYMPHSABS 0.8 (L) 09/24/2017 1601   MONOABS 0.1 11/01/2017 1255   MONOABS 0.2 09/24/2017 1601   EOSABS 0.0 11/01/2017 1255  EOSABS 0.0 09/24/2017 1601   BASOSABS 0.0 11/01/2017 1255   BASOSABS 0.0 09/24/2017 1601   -------------------------------  Imaging from last 24 hours (if applicable):  Radiology interpretation: Ct Chest W Contrast  Result Date: 10/25/2017 CLINICAL DATA:  Non-small cell lung cancer, restaging, ongoing chemotherapy. EXAM: CT CHEST, ABDOMEN, AND PELVIS WITH CONTRAST TECHNIQUE: Multidetector CT imaging of the chest, abdomen and pelvis was performed following the standard protocol during bolus administration of intravenous contrast. CONTRAST:  100 cc Isovue-300. COMPARISON:  PET 05/05/2017 and CT chest 04/19/2017. CT abdomen pelvis 03/11/2017. FINDINGS: CT CHEST FINDINGS Cardiovascular: Right IJ Port-A-Cath terminates in the right atrium. Aberrant right subclavian artery. Heart size normal. No pericardial effusion. Mediastinum/Nodes: No pathologically enlarged mediastinal, hilar or axillary lymph nodes. Esophagus is grossly unremarkable. Lungs/Pleura: Large right pleural effusion, increased from 05/05/2017, with post radiation changes in the medial right hemithorax. New hyperattenuating extrapleural thickening in the posterior right hemithorax, at the level of the right ninth and tenth ribs. Patchy peribronchovascular consolidation and ground-glass in the medial left lower lobe, new. No left pleural fluid. Airway is otherwise unremarkable. Musculoskeletal: Slight lytic destruction of the posterior right ninth rib, new. Large posterior right chest wall mass measures 4.8 x 9.7 cm, grossly stable from 05/05/2017. A 2.0 x 3.0 cm soft tissue mass lateral to the inferior right scapula (series 2, image 23) appears new from 04/19/2017. Mildly hyperattenuating soft tissue nodules are seen posterior to the right ninth and tenth intercostal space, with hyperattenuating soft  tissue extending into the intercostal region and likely extrapleural space, new or better visualized on the current exam. CT ABDOMEN PELVIS FINDINGS Hepatobiliary: Low-attenuation lesions in the liver measure up to 10 mm in the dome, as before. Liver and gallbladder are otherwise unremarkable. No biliary ductal dilatation. Pancreas: Negative. Spleen: Negative. Adrenals/Urinary Tract: Adrenal glands and kidneys are unremarkable. Ureters are decompressed. Bladder is grossly unremarkable. Stomach/Bowel: Stomach is decompressed. Small bowel and appendix are unremarkable. Stool is seen in the majority of the colon, indicative of constipation. Colon is otherwise unremarkable. Vascular/Lymphatic: Atherosclerotic calcification of the arterial vasculature without abdominal aortic aneurysm. No pathologically enlarged lymph nodes. Reproductive: Uterus is visualized. Fat containing lesion in the right adnexa measures up to 5.8 cm, slightly enlarged from 4.9 cm on 03/11/2017. Other: No free fluid.  Mesenteries and peritoneum are unremarkable. Musculoskeletal: No additional worrisome lytic or sclerotic lesions. IMPRESSION: 1. Progressive right posterior chest wall metastatic disease with probable infiltration of the right 9/10 intercostal space, into the posterior extrapleural space of the right hemithorax. 2. New lytic destruction of the posterior right ninth rib. 3. Large right pleural effusion, markedly increased from prior. 4. New peribronchovascular ground-glass and consolidation in the left lower lobe, likely infectious or inflammatory in etiology. 5.  Aortic atherosclerosis (ICD10-170.0). 6. Fat containing lesion in the right adnexa measures slightly larger than on 03/11/2017 and may represent an ovarian dermoid. Electronically Signed   By: Lorin Picket M.D.   On: 10/25/2017 12:57   Ct Abdomen Pelvis W Contrast  Result Date: 10/25/2017 CLINICAL DATA:  Non-small cell lung cancer, restaging, ongoing chemotherapy. EXAM:  CT CHEST, ABDOMEN, AND PELVIS WITH CONTRAST TECHNIQUE: Multidetector CT imaging of the chest, abdomen and pelvis was performed following the standard protocol during bolus administration of intravenous contrast. CONTRAST:  100 cc Isovue-300. COMPARISON:  PET 05/05/2017 and CT chest 04/19/2017. CT abdomen pelvis 03/11/2017. FINDINGS: CT CHEST FINDINGS Cardiovascular: Right IJ Port-A-Cath terminates in the right atrium. Aberrant right subclavian artery. Heart size normal. No pericardial effusion.  Mediastinum/Nodes: No pathologically enlarged mediastinal, hilar or axillary lymph nodes. Esophagus is grossly unremarkable. Lungs/Pleura: Large right pleural effusion, increased from 05/05/2017, with post radiation changes in the medial right hemithorax. New hyperattenuating extrapleural thickening in the posterior right hemithorax, at the level of the right ninth and tenth ribs. Patchy peribronchovascular consolidation and ground-glass in the medial left lower lobe, new. No left pleural fluid. Airway is otherwise unremarkable. Musculoskeletal: Slight lytic destruction of the posterior right ninth rib, new. Large posterior right chest wall mass measures 4.8 x 9.7 cm, grossly stable from 05/05/2017. A 2.0 x 3.0 cm soft tissue mass lateral to the inferior right scapula (series 2, image 23) appears new from 04/19/2017. Mildly hyperattenuating soft tissue nodules are seen posterior to the right ninth and tenth intercostal space, with hyperattenuating soft tissue extending into the intercostal region and likely extrapleural space, new or better visualized on the current exam. CT ABDOMEN PELVIS FINDINGS Hepatobiliary: Low-attenuation lesions in the liver measure up to 10 mm in the dome, as before. Liver and gallbladder are otherwise unremarkable. No biliary ductal dilatation. Pancreas: Negative. Spleen: Negative. Adrenals/Urinary Tract: Adrenal glands and kidneys are unremarkable. Ureters are decompressed. Bladder is grossly  unremarkable. Stomach/Bowel: Stomach is decompressed. Small bowel and appendix are unremarkable. Stool is seen in the majority of the colon, indicative of constipation. Colon is otherwise unremarkable. Vascular/Lymphatic: Atherosclerotic calcification of the arterial vasculature without abdominal aortic aneurysm. No pathologically enlarged lymph nodes. Reproductive: Uterus is visualized. Fat containing lesion in the right adnexa measures up to 5.8 cm, slightly enlarged from 4.9 cm on 03/11/2017. Other: No free fluid.  Mesenteries and peritoneum are unremarkable. Musculoskeletal: No additional worrisome lytic or sclerotic lesions. IMPRESSION: 1. Progressive right posterior chest wall metastatic disease with probable infiltration of the right 9/10 intercostal space, into the posterior extrapleural space of the right hemithorax. 2. New lytic destruction of the posterior right ninth rib. 3. Large right pleural effusion, markedly increased from prior. 4. New peribronchovascular ground-glass and consolidation in the left lower lobe, likely infectious or inflammatory in etiology. 5.  Aortic atherosclerosis (ICD10-170.0). 6. Fat containing lesion in the right adnexa measures slightly larger than on 03/11/2017 and may represent an ovarian dermoid. Electronically Signed   By: Lorin Picket M.D.   On: 10/25/2017 12:57        This case was discussed with Dr. Julien Nordmann. He expressed agreement with my management of this patient.

## 2017-11-01 NOTE — H&P (Signed)
History and Physical    JOCELYN NOLD MBW:[id number] DOB: [birth year] DOA: 11/01/2017  PCP: [name], PA-C  Patient coming from: Home  Chief Complaint: Nausea vomiting coughing a lot  HPI: KAYDENSE RIZO is a 57 y.o. female with medical history significant of breast cancer with metastatic disease comes in with 2 days of coughing a lot with posttussive emesis.  Patient reports that several people in her household have been sick with flulike symptoms lately.  She denies any fevers.  She denies any chills.  She has been short of breath.  Patient seen in oncology office today and was referred for admission for possibly neutropenic fever.  Her temperature today was 99.7 in the office.  Denies any urinary symptoms.  She denies any abdominal pain.  She is been having symptoms for about 48 hours now.  Review of Systems: As per HPI otherwise 10 point review of systems negative.   Past Medical History:  Diagnosis Date  . Cancer ([place])    5 years ago - cervical cancer  . Encounter for antineoplastic chemotherapy 08/27/2015  . History of radiation therapy 08/19/2015 - 10/07/2015   Site/dose:   The patient's primary tumor and involved lymph nodes were treated to 66 Gy in 30 fractions.  . Hypokalemia 09/30/2015  . Non-small cell carcinoma of lung, stage 3 ([place]) 08/08/2015  . Paroxysmal atrial flutter ([place]) 09/16/2015  . Pneumonia     Past Surgical History:  Procedure Laterality Date  . IR FLUORO GUIDE PORT INSERTION RIGHT  05/21/2017  . IR US GUIDE VASC ACCESS RIGHT  05/21/2017  . TUBAL LIGATION    . VIDEO BRONCHOSCOPY WITH ENDOBRONCHIAL ULTRASOUND N/A 08/02/2015   Procedure: VIDEO BRONCHOSCOPY WITH ENDOBRONCHIAL ULTRASOUND;  Surgeon: [name], MD;  Location: [facility];  Service: Thoracic;  Laterality: N/A;  . WISDOM TOOTH EXTRACTION       reports that she quit smoking about 27 years ago. Her smoking use included cigarettes. She has a 1.50 pack-year smoking history. she has never used  smokeless tobacco. She reports that she does not drink alcohol or use drugs.  No Known Allergies  Family History  Problem Relation Age of Onset  . COPD Mother   . Diabetes type II Mother   . Heart disease Mother   . High Cholesterol Mother   . Lung cancer Father     Prior to Admission medications   Medication Sig Start Date End Date Taking? Authorizing Provider  calcium-vitamin D 250-100 MG-UNIT tablet Take 1 tablet by mouth 2 (two) times daily.    [provider]  diphenoxylate-atropine (LOMOTIL) 2.5-0.025 MG tablet Take 2 tablets by mouth 4 (four) times daily as needed for diarrhea or loose stools. Patient not taking: Reported on 09/15/2017 09/02/17   Sandi Mealy E., PA-C  docusate sodium (COLACE) 100 MG capsule Take 1 capsule by mouth daily as needed. 06/14/11   [provider]  gabapentin (NEURONTIN) 100 MG capsule Take 2 capsules (200 mg total) by mouth 3 (three) times daily. 10/15/17   Maryanna Shape, NP  HYDROCODONE-ACETAMINOPHEN PO Take 1 tablet by mouth every 6 (six) hours as needed. For pain 06/14/11   [provider]  ibuprofen (ADVIL,MOTRIN) 800 MG tablet Take 1 tablet by mouth every 8 (eight) hours as needed. 06/14/11   [provider]  lactulose (CHRONULAC) 10 GM/15ML solution Take 15 mLs (10 g total) by mouth 3 (three) times daily as needed for mild constipation. Patient not taking: Reported on 09/02/2017 05/31/17  Harle Stanford., PA-C  lidocaine-prilocaine (EMLA) cream Apply 1 application topically as needed. 05/27/17   Maryanna Shape, NP  linaclotide (LINZESS) 145 MCG CAPS capsule Take 145 mcg by mouth daily before breakfast.    [provider]  LORazepam (ATIVAN) 0.5 MG tablet 0.5 mg sublingual p.o. every 6 hours as needed for nausea and/or vomiting. Patient not taking: Reported on 10/15/2017 09/02/17   Sandi Mealy E., PA-C  magnesium hydroxide (MILK OF MAGNESIA) 400 MG/5ML suspension Take 5 mLs by mouth at bedtime as needed  for mild constipation.    [provider]  Multiple Vitamin (MULTIVITAMIN) capsule Take 1 capsule daily by mouth. 07/29/17   Curt Bears, MD  ondansetron (ZOFRAN-ODT) 8 MG disintegrating tablet Take 1 tablet (8 mg total) by mouth every 8 (eight) hours as needed for nausea or vomiting. 09/02/17   Tanner, Lyndon Code., PA-C  oxyCODONE (OXY IR/ROXICODONE) 5 MG immediate release tablet Take 1 tablet (5 mg total) by mouth every 6 (six) hours as needed for severe pain. 10/18/17   Curt Bears, MD  potassium chloride SA (K-DUR,KLOR-CON) 20 MEQ tablet Take 2 tablets twice a day for 5 days then 1 tablet daily. 10/27/17   Maryanna Shape, NP  prochlorperazine (COMPAZINE) 10 MG tablet Take 1 tablet (10 mg total) by mouth every 6 (six) hours as needed for nausea or vomiting. 08/13/17   Maryanna Shape, NP    Physical Exam: Vitals:   11/01/17 1903  Weight: 92.5 kg (204 lb)  Height: 5\' 8"  (1.727 m)      Constitutional: NAD, calm, comfortable Vitals:   11/01/17 1903  Weight: 92.5 kg (204 lb)  Height: 5\' 8"  (1.727 m)   Eyes: PERRL, lids and conjunctivae normal ENMT: Mucous membranes are moist. Posterior pharynx clear of any exudate or lesions.Normal dentition.  Neck: normal, supple, no masses, no thyromegaly Respiratory: clear to auscultation bilaterally, no wheezing, no crackles. Normal respiratory effort. No accessory muscle use.  Cardiovascular: Regular rate and rhythm, no murmurs / rubs / gallops. No extremity edema. 2+ pedal pulses. No carotid bruits.  Abdomen: no tenderness, no masses palpated. No hepatosplenomegaly. Bowel sounds positive.  Musculoskeletal: no clubbing / cyanosis. No joint deformity upper and lower extremities. Good ROM, no contractures. Normal muscle tone.  Skin: no rashes, lesions, ulcers. No induration Neurologic: CN 2-12 grossly intact. Sensation intact, DTR normal. Strength 5/5 in all 4.  Psychiatric: Normal judgment and insight. Alert and oriented x 3. Normal  mood.    Labs on Admission: I have personally reviewed following labs and imaging studies  CBC: Recent Labs  Lab 10/26/17 0941 11/01/17 1255  WBC 3.5* 1.0*  NEUTROABS 2.6 0.6*  HGB 10.0*  --   HCT 29.7* 32.1*  MCV 95.3 96.1  PLT 140* 77*   Basic Metabolic Panel: Recent Labs  Lab 10/26/17 0941 11/01/17 1255 11/01/17 1613  NA 140 132* 134*  K 3.2* 3.6 3.6  CL 106 97* 102  CO2 25 23 23   GLUCOSE 109 117 127  BUN 9 10 9   CREATININE 0.78 0.81 0.82  CALCIUM 9.1 9.1 8.5   GFR: Estimated Creatinine Clearance: 92.1 mL/min (by C-G formula based on SCr of 0.82 mg/dL). Liver Function Tests: Recent Labs  Lab 10/26/17 0941 11/01/17 1255 11/01/17 1613  AST 26 45* 41*  ALT 13 19 15   ALKPHOS 99 91 80  BILITOT 0.4 1.2 1.0  PROT 7.5 8.0 7.1  ALBUMIN 3.4* 3.7 3.3*   No results for input(s): LIPASE, AMYLASE in  the last 168 hours. No results for input(s): AMMONIA in the last 168 hours. Coagulation Profile: No results for input(s): INR, PROTIME in the last 168 hours. Cardiac Enzymes: No results for input(s): CKTOTAL, CKMB, CKMBINDEX, TROPONINI in the last 168 hours. BNP (last 3 results) No results for input(s): PROBNP in the last 8760 hours. HbA1C: No results for input(s): HGBA1C in the last 72 hours. CBG: No results for input(s): GLUCAP in the last 168 hours. Lipid Profile: No results for input(s): CHOL, HDL, LDLCALC, TRIG, CHOLHDL, LDLDIRECT in the last 72 hours. Thyroid Function Tests: No results for input(s): TSH, T4TOTAL, FREET4, T3FREE, THYROIDAB in the last 72 hours. Anemia Panel: No results for input(s): VITAMINB12, FOLATE, FERRITIN, TIBC, IRON, RETICCTPCT in the last 72 hours. Urine analysis:    Component Value Date/Time   COLORURINE STRAW (A) 03/11/2017 1025   APPEARANCEUR CLEAR 03/11/2017 1025   LABSPEC 1.016 03/11/2017 1025   PHURINE 7.0 03/11/2017 1025   GLUCOSEU NEGATIVE 03/11/2017 1025   HGBUR NEGATIVE 03/11/2017 1025   BILIRUBINUR NEGATIVE 03/11/2017  1025   KETONESUR NEGATIVE 03/11/2017 1025   PROTEINUR NEGATIVE 03/11/2017 1025   UROBILINOGEN 1.0 07/15/2015 1144   NITRITE NEGATIVE 03/11/2017 1025   LEUKOCYTESUR LARGE (A) 03/11/2017 1025   Sepsis Labs: !!!!!!!!!!!!!!!!!!!!!!!!!!!!!!!!!!!!!!!!!!!! @LABRCNTIP (procalcitonin:4,lacticidven:4) )No results found for this or any previous visit (from the past 240 hour(s)).   Radiological Exams on Admission: No results found.    Assessment/Plan 57 year old female breast cancer who is neutropenic comes in with respiratory symptoms along with nausea vomiting diarrhea Principal Problem:   Intractable nausea and vomiting-this seems more associated with coughing a lot.  Check chest x-ray.  Check respiratory viral panel.  Check urinalysis.Marland Kitchen  Zofran ordered.  Start empirically on Tamiflu.  Have ordered her care ordered to call if patient has a fever over 100.4 at which point she would need to be treated for neutropenic fever and less there is a clear source with above workup.  Active Problems:   Dehydration-IV fluids overnight   Non-small cell carcinoma of lung, stage 3 (HCC)-noted   Antineoplastic chemotherapy induced pancytopenia (HCC)-monitor for fever.    DVT prophylaxis: SCDs Code Status: Full Family Communication: None Disposition Plan: Per day team Consults called: None Admission status: Observation   Aj Crunkleton A MD [facility]  If 7PM-7AM, please contact night-coverage [email or web address] Password [facility]  11/01/2017, 7:05 PM

## 2017-11-02 ENCOUNTER — Other Ambulatory Visit: Payer: Medicare Other

## 2017-11-02 DIAGNOSIS — J910 Malignant pleural effusion: Secondary | ICD-10-CM | POA: Diagnosis not present

## 2017-11-02 DIAGNOSIS — D61810 Antineoplastic chemotherapy induced pancytopenia: Secondary | ICD-10-CM | POA: Diagnosis not present

## 2017-11-02 DIAGNOSIS — T451X5A Adverse effect of antineoplastic and immunosuppressive drugs, initial encounter: Secondary | ICD-10-CM | POA: Diagnosis not present

## 2017-11-02 DIAGNOSIS — J101 Influenza due to other identified influenza virus with other respiratory manifestations: Secondary | ICD-10-CM

## 2017-11-02 DIAGNOSIS — Z87891 Personal history of nicotine dependence: Secondary | ICD-10-CM | POA: Diagnosis not present

## 2017-11-02 DIAGNOSIS — J90 Pleural effusion, not elsewhere classified: Secondary | ICD-10-CM | POA: Diagnosis present

## 2017-11-02 DIAGNOSIS — G43A1 Cyclical vomiting, intractable: Secondary | ICD-10-CM

## 2017-11-02 DIAGNOSIS — C3490 Malignant neoplasm of unspecified part of unspecified bronchus or lung: Secondary | ICD-10-CM

## 2017-11-02 DIAGNOSIS — R112 Nausea with vomiting, unspecified: Secondary | ICD-10-CM | POA: Diagnosis not present

## 2017-11-02 DIAGNOSIS — Z8541 Personal history of malignant neoplasm of cervix uteri: Secondary | ICD-10-CM | POA: Diagnosis not present

## 2017-11-02 DIAGNOSIS — C7989 Secondary malignant neoplasm of other specified sites: Secondary | ICD-10-CM | POA: Diagnosis present

## 2017-11-02 DIAGNOSIS — E876 Hypokalemia: Secondary | ICD-10-CM | POA: Diagnosis not present

## 2017-11-02 DIAGNOSIS — E860 Dehydration: Secondary | ICD-10-CM | POA: Diagnosis present

## 2017-11-02 DIAGNOSIS — Z79899 Other long term (current) drug therapy: Secondary | ICD-10-CM | POA: Diagnosis not present

## 2017-11-02 LAB — BASIC METABOLIC PANEL
Anion gap: 9 (ref 5–15)
BUN: 12 mg/dL (ref 6–20)
CHLORIDE: 103 mmol/L (ref 101–111)
CO2: 24 mmol/L (ref 22–32)
CREATININE: 0.63 mg/dL (ref 0.44–1.00)
Calcium: 8.6 mg/dL — ABNORMAL LOW (ref 8.9–10.3)
Glucose, Bld: 147 mg/dL — ABNORMAL HIGH (ref 65–99)
POTASSIUM: 3.8 mmol/L (ref 3.5–5.1)
SODIUM: 136 mmol/L (ref 135–145)

## 2017-11-02 LAB — RESPIRATORY PANEL BY PCR
ADENOVIRUS-RVPPCR: NOT DETECTED
Bordetella pertussis: NOT DETECTED
CORONAVIRUS 229E-RVPPCR: NOT DETECTED
CORONAVIRUS HKU1-RVPPCR: NOT DETECTED
CORONAVIRUS NL63-RVPPCR: NOT DETECTED
Chlamydophila pneumoniae: NOT DETECTED
Coronavirus OC43: NOT DETECTED
INFLUENZA A H1 2009-RVPPR: NOT DETECTED
INFLUENZA B-RVPPCR: NOT DETECTED
Influenza A H1: NOT DETECTED
Influenza A H3: DETECTED — AB
Influenza A: NOT DETECTED
METAPNEUMOVIRUS-RVPPCR: NOT DETECTED
MYCOPLASMA PNEUMONIAE-RVPPCR: NOT DETECTED
PARAINFLUENZA VIRUS 1-RVPPCR: NOT DETECTED
PARAINFLUENZA VIRUS 2-RVPPCR: NOT DETECTED
Parainfluenza Virus 3: NOT DETECTED
Parainfluenza Virus 4: NOT DETECTED
Respiratory Syncytial Virus: NOT DETECTED
Rhinovirus / Enterovirus: NOT DETECTED

## 2017-11-02 LAB — CBC
HCT: 24.7 % — ABNORMAL LOW (ref 36.0–46.0)
Hemoglobin: 8.9 g/dL — ABNORMAL LOW (ref 12.0–15.0)
MCH: 33.2 pg (ref 26.0–34.0)
MCHC: 36 g/dL (ref 30.0–36.0)
MCV: 92.2 fL (ref 78.0–100.0)
PLATELETS: 53 10*3/uL — AB (ref 150–400)
RBC: 2.68 MIL/uL — AB (ref 3.87–5.11)
RDW: 19.4 % — ABNORMAL HIGH (ref 11.5–15.5)
WBC: 0.9 10*3/uL — AB (ref 4.0–10.5)

## 2017-11-02 LAB — URINALYSIS, ROUTINE W REFLEX MICROSCOPIC
BILIRUBIN URINE: NEGATIVE
Glucose, UA: NEGATIVE mg/dL
Hgb urine dipstick: NEGATIVE
Ketones, ur: NEGATIVE mg/dL
Leukocytes, UA: NEGATIVE
NITRITE: NEGATIVE
Protein, ur: NEGATIVE mg/dL
SPECIFIC GRAVITY, URINE: 1.012 (ref 1.005–1.030)
pH: 6 (ref 5.0–8.0)

## 2017-11-02 LAB — MRSA PCR SCREENING: MRSA by PCR: NEGATIVE

## 2017-11-02 MED ORDER — LORATADINE 10 MG PO TABS
10.0000 mg | ORAL_TABLET | Freq: Every day | ORAL | Status: DC
  Administered 2017-11-02 – 2017-11-04 (×3): 10 mg via ORAL
  Filled 2017-11-02 (×3): qty 1

## 2017-11-02 MED ORDER — METOPROLOL TARTRATE 25 MG PO TABS
12.5000 mg | ORAL_TABLET | Freq: Two times a day (BID) | ORAL | Status: DC
  Administered 2017-11-02 – 2017-11-04 (×5): 12.5 mg via ORAL
  Filled 2017-11-02 (×5): qty 1

## 2017-11-02 MED ORDER — FLUTICASONE PROPIONATE 50 MCG/ACT NA SUSP
1.0000 | Freq: Every day | NASAL | Status: DC
  Administered 2017-11-02 – 2017-11-04 (×3): 1 via NASAL
  Filled 2017-11-02: qty 16

## 2017-11-02 MED ORDER — DIPHENHYDRAMINE HCL 25 MG PO CAPS
25.0000 mg | ORAL_CAPSULE | Freq: Four times a day (QID) | ORAL | Status: DC | PRN
  Administered 2017-11-02 – 2017-11-03 (×4): 25 mg via ORAL
  Filled 2017-11-02 (×4): qty 1

## 2017-11-02 MED ORDER — TBO-FILGRASTIM 300 MCG/0.5ML ~~LOC~~ SOSY
300.0000 ug | PREFILLED_SYRINGE | Freq: Once | SUBCUTANEOUS | Status: AC
  Administered 2017-11-02: 300 ug via SUBCUTANEOUS
  Filled 2017-11-02: qty 0.5

## 2017-11-02 NOTE — Progress Notes (Signed)
These preliminary result these preliminary results were noted.  Awaiting final report.

## 2017-11-02 NOTE — Progress Notes (Signed)
Nutrition Brief Note  Patient identified on the Malnutrition Screening Tool (MST) Report  Wt Readings from Last 15 Encounters:  11/02/17 204 lb 5.9 oz (92.7 kg)  10/26/17 209 lb 9.6 oz (95.1 kg)  10/15/17 208 lb 4.8 oz (94.5 kg)  10/05/17 205 lb 6.4 oz (93.2 kg)  09/15/17 205 lb 11.2 oz (93.3 kg)  08/26/17 202 lb 1.6 oz (91.7 kg)  08/13/17 202 lb 6.4 oz (91.8 kg)  07/29/17 203 lb 11.2 oz (92.4 kg)  07/08/17 207 lb 4.8 oz (94 kg)  06/17/17 209 lb 14.4 oz (95.2 kg)  06/01/17 212 lb 8 oz (96.4 kg)  05/31/17 212 lb 8 oz (96.4 kg)  05/27/17 216 lb 9.6 oz (98.2 kg)  05/12/17 221 lb 6.4 oz (100.4 kg)  05/03/17 224 lb (101.6 kg)    Body mass index is 31.07 kg/m. Patient meets criteria for obesity based on current BMI. Skin WDL. Pt with PMH which includes stage 3 NSCLC on chemo. She arrived to the ED after having 2 days of N/V and coughing and reported that several family members had flu-like symptoms. She has had a decreased appetite for a few days d/t not feeling well but otherwise has no issues with eating. She was previously being followed by [facility] RD and was last seen by RD in October 2018. Per Dr. Camelia Eng note yesterday evening, pt with dehydration at time of admission.   Current diet order is Heart Healthy. Continue to encourage PO intakes as tolerated. No episodes of emesis since admission.  Medications reviewed; 75 mg Tamiflu BID, 10 mEq oral KCl/day. Labs reviewed; Ca: 8.6 mg/dL.  No nutrition interventions warranted at this time. If nutrition issues arise, please consult RD.      Jarome Matin, MS, RD, LDN, [facility] Inpatient Clinical Dietitian Pager # [phone] After hours/weekend pager # [phone]

## 2017-11-02 NOTE — Progress Notes (Signed)
Patient asked to ambulate to the bathroom which she has done previously. While up walking to the bathroom, patient felt dizzy and was a little unsteady on her feet.  RN checked BP while sitting down. BP 145/95, pulse 108. After voiding, pt. Returned to the bed where the dizziness has subsided.  Will continue to monitor.

## 2017-11-02 NOTE — Progress Notes (Signed)
CRITICAL VALUE ALERT  Critical Value: WBC 0.9  Date & Time Notied: 11/02/17  0425  Provider Notified: schorr  Orders Received/Actions taken: on call made aware

## 2017-11-02 NOTE — Progress Notes (Signed)
PROGRESS NOTE  Theresa [name] SHF:[id number] DOB: [birth year] DOA: 11/01/2017 PCP: [name], PA-C  HPI/Recap of past 24 hours:  Has sinus tachycardia, feeling sob, denies chest pain, no hypoxia She reports lose bmx1 this am, denies ab pain, n/v has resolved.  Assessment/Plan: Principal Problem:   Intractable nausea and vomiting Active Problems:   Non-small cell carcinoma of lung, stage 3 (HCC)   Antineoplastic chemotherapy induced pancytopenia (HCC)   Dehydration  Influenza A: presented with n/v, report sick contact at home Started on tamiflu  Sinus tachycardia: bp slightly elevated, start low dose lopressor with holding parameters  Pancytopenia/neuropenia (wbc 0.9, plt 53, hgb 8.9) Last chemo with palicaxol and cisplatin on 2/5 neuropenic precaution, start gcsf Transfuse as needed, hematology/oncology Dr Julien Nordmann notified from epic  Metastatic non-small cell lung cancer with mets to right posterior chest wall, malignant pleural effusion ( last thoracentesis was in 07/2017 , reported filled 2.5 bottles) -Reports feeling sob, currently no hypoxia -cxr personally reviewed "Persistent moderate to large right-sided pleural effusion" -Will likely need thoracentesis this hospitalization once plt.   Code Status: full  Family Communication: patient   Disposition Plan: not ready for discharge   Consultants:  Hematology/Oncology  Dr Julien Nordmann notified through epic  Procedures:  none  Antibiotics:  none   Objective: BP (!) 145/95   Pulse (!) 108   Temp 99.3 F (37.4 C) (Oral)   Resp 18   Ht 5\' 8"  (1.727 m)   Wt 92.7 kg (204 lb 5.9 oz)   SpO2 97%   BMI 31.07 kg/m   Intake/Output Summary (Last 24 hours) at 11/02/2017 1931 Last data filed at 11/02/2017 1327 Gross per 24 hour  Intake 831.67 ml  Output 800 ml  Net 31.67 ml   Filed Weights   11/01/17 1903 11/02/17 0527  Weight: 92.5 kg (204 lb) 92.7 kg (204 lb 5.9 oz)    Exam: Patient is examined daily  including today on 11/02/2017, exams remain the same as of yesterday except that has changed    General:  NAD  Cardiovascular: sinus tachycardia   Respiratory: diminished on the right, no wheezing, no rhonchi  Abdomen: Soft/ND/NT, positive BS  Musculoskeletal: No Edema  Neuro: alert, oriented   Data Reviewed: Basic Metabolic Panel: Recent Labs  Lab 11/01/17 1255 11/01/17 1613 11/02/17 0313  NA 132* 134* 136  K 3.6 3.6 3.8  CL 97* 102 103  CO2 23 23 24   GLUCOSE 117 127 147*  BUN 10 9 12   CREATININE 0.81 0.82 0.63  CALCIUM 9.1 8.5 8.6*   Liver Function Tests: Recent Labs  Lab 11/01/17 1255 11/01/17 1613  AST 45* 41*  ALT 19 15  ALKPHOS 91 80  BILITOT 1.2 1.0  PROT 8.0 7.1  ALBUMIN 3.7 3.3*   No results for input(s): LIPASE, AMYLASE in the last 168 hours. No results for input(s): AMMONIA in the last 168 hours. CBC: Recent Labs  Lab 11/01/17 1255 11/02/17 0313  WBC 1.0* 0.9*  NEUTROABS 0.6*  --   HGB  --  8.9*  HCT 32.1* 24.7*  MCV 96.1 92.2  PLT 77* 53*   Cardiac Enzymes:   No results for input(s): CKTOTAL, CKMB, CKMBINDEX, TROPONINI in the last 168 hours. BNP (last 3 results) No results for input(s): BNP in the last 8760 hours.  ProBNP (last 3 results) No results for input(s): PROBNP in the last 8760 hours.  CBG: No results for input(s): GLUCAP in the last 168 hours.  Recent Results (from the past  240 hour(s))  Culture, Blood     Status: None (Preliminary result)   Collection Time: 11/01/17  4:30 PM  Result Value Ref Range Status   Specimen Description   Final    BLOOD LEFT ARM Performed at [facility], [place] [address], [place], [place] [address]    Special Requests   Final    BOTTLES DRAWN AEROBIC AND ANAEROBIC Blood Culture results may not be optimal due to an excessive volume of blood received in culture bottles   Culture   Final    NO GROWTH < 24 HOURS Performed at [facility] [address],  [place], [place] [address]    Report Status PENDING  Incomplete  Culture, Blood     Status: None (Preliminary result)   Collection Time: 11/01/17  4:34 PM  Result Value Ref Range Status   Specimen Description BLOOD PORTA CATH  Final   Special Requests   Final    BOTTLES DRAWN AEROBIC AND ANAEROBIC Blood Culture adequate volume   Culture   Final    NO GROWTH < 24 HOURS Performed at [facility], [place] [address], [place], [place] [address]    Report Status PENDING  Incomplete  Respiratory Panel by PCR     Status: Abnormal   Collection Time: 11/01/17 10:48 PM  Result Value Ref Range Status   Adenovirus NOT DETECTED NOT DETECTED Final   Coronavirus 229E NOT DETECTED NOT DETECTED Final   Coronavirus HKU1 NOT DETECTED NOT DETECTED Final   Coronavirus NL63 NOT DETECTED NOT DETECTED Final   Coronavirus OC43 NOT DETECTED NOT DETECTED Final   Metapneumovirus NOT DETECTED NOT DETECTED Final   Rhinovirus / Enterovirus NOT DETECTED NOT DETECTED Final   Influenza A NOT DETECTED NOT DETECTED Final   Influenza A H1 NOT DETECTED NOT DETECTED Final   Influenza A H1 2009 NOT DETECTED NOT DETECTED Final   Influenza A H3 DETECTED (A) NOT DETECTED Final   Influenza B NOT DETECTED NOT DETECTED Final   Parainfluenza Virus 1 NOT DETECTED NOT DETECTED Final   Parainfluenza Virus 2 NOT DETECTED NOT DETECTED Final   Parainfluenza Virus 3 NOT DETECTED NOT DETECTED Final   Parainfluenza Virus 4 NOT DETECTED NOT DETECTED Final   Respiratory Syncytial Virus NOT DETECTED NOT DETECTED Final   Bordetella pertussis NOT DETECTED NOT DETECTED Final   Chlamydophila pneumoniae NOT DETECTED NOT DETECTED Final   Mycoplasma pneumoniae NOT DETECTED NOT DETECTED Final    Comment: Performed at [facility], [place] [address], [place], [place] [address]  MRSA PCR Screening     Status: None   Collection Time: 11/02/17 10:50 AM  Result Value Ref Range Status   MRSA by PCR NEGATIVE NEGATIVE Final    Comment:        The  GeneXpert MRSA Assay (FDA approved for NASAL specimens only), is one component of a comprehensive MRSA colonization surveillance program. It is not intended to diagnose MRSA infection nor to guide or monitor treatment for MRSA infections. Performed at [facility], [place] [address], [place], [place] [address]      Studies: Dg Chest 2 View  Result Date: 11/01/2017 CLINICAL DATA:  Acute onset of cough. Patient on chemotherapy for lung cancer. EXAM: CHEST  2 VIEW COMPARISON:  Chest radiograph performed 08/03/2017, and CT of the chest performed 10/25/2017 FINDINGS: There is a persistent moderate to large right-sided pleural effusion, reflecting the underlying malignancy. The left lung appears relatively clear.  No pneumothorax is seen. The heart is normal in size, though difficult to fully assess given the pleural effusion. A right-sided chest port is noted ending about the mid SVC. No acute osseous abnormalities are seen. IMPRESSION: Persistent moderate to large right-sided pleural effusion, reflecting underlying malignancy. Electronically Signed   By: Garald Balding M.D.   On: 11/01/2017 22:09    Scheduled Meds: . fluticasone  1 spray Each Nare Daily  . gabapentin  200 mg Oral TID  . loratadine  10 mg Oral Daily  . metoprolol tartrate  12.5 mg Oral BID  . oseltamivir  75 mg Oral BID  . potassium chloride SA  10 mEq Oral Daily    Continuous Infusions:   Time spent: 35 mins I have personally reviewed and interpreted on  11/02/2017 daily labs, tele strips, imagings as discussed above under date review session and assessment and plans.  I reviewed all nursing notes,  vitals, pertinent old records  I have discussed plan of care as described above with RN , patient on 11/02/2017   Florencia Reasons MD, PhD  [facility] Pager [phone]. If 7PM-7AM, please contact night-coverage at [email or web address], password [facility] 11/02/2017, 7:31 PM  LOS: 1 day

## 2017-11-03 DIAGNOSIS — E876 Hypokalemia: Secondary | ICD-10-CM

## 2017-11-03 LAB — COMPREHENSIVE METABOLIC PANEL
ALK PHOS: 63 U/L (ref 38–126)
ALT: 32 U/L (ref 14–54)
ANION GAP: 10 (ref 5–15)
AST: 52 U/L — ABNORMAL HIGH (ref 15–41)
Albumin: 3.1 g/dL — ABNORMAL LOW (ref 3.5–5.0)
BUN: 9 mg/dL (ref 6–20)
CALCIUM: 8.5 mg/dL — AB (ref 8.9–10.3)
CO2: 23 mmol/L (ref 22–32)
Chloride: 105 mmol/L (ref 101–111)
Creatinine, Ser: 0.69 mg/dL (ref 0.44–1.00)
GFR calc Af Amer: >60 mL/min (ref >60)
GFR calc non Af Amer: >60 mL/min (ref >60)
Glucose, Bld: 90 mg/dL (ref 65–99)
Potassium: 3.2 mmol/L — ABNORMAL LOW (ref 3.5–5.1)
SODIUM: 138 mmol/L (ref 135–145)
TOTAL PROTEIN: 6.2 g/dL — AB (ref 6.5–8.1)
Total Bilirubin: 0.4 mg/dL (ref 0.3–1.2)

## 2017-11-03 LAB — CBC WITH DIFFERENTIAL/PLATELET
BASOS PCT: 0 %
Basophils Absolute: 0 10*3/uL (ref 0.0–0.1)
EOS ABS: 0 10*3/uL (ref 0.0–0.7)
Eosinophils Relative: 1 %
HCT: 23 % — ABNORMAL LOW (ref 36.0–46.0)
HEMOGLOBIN: 8.3 g/dL — AB (ref 12.0–15.0)
LYMPHS PCT: 37 %
Lymphs Abs: 0.4 10*3/uL — ABNORMAL LOW (ref 0.7–4.0)
MCH: 33.5 pg (ref 26.0–34.0)
MCHC: 36.1 g/dL — AB (ref 30.0–36.0)
MCV: 92.7 fL (ref 78.0–100.0)
MONO ABS: 0.2 10*3/uL (ref 0.1–1.0)
Monocytes Relative: 22 %
Neutro Abs: 0.4 10*3/uL — ABNORMAL LOW (ref 1.7–7.7)
Neutrophils Relative %: 40 %
Platelets: 39 10*3/uL — ABNORMAL LOW (ref 150–400)
RBC: 2.48 MIL/uL — ABNORMAL LOW (ref 3.87–5.11)
RDW: 19.6 % — ABNORMAL HIGH (ref 11.5–15.5)
WBC: 1 10*3/uL — CL (ref 4.0–10.5)

## 2017-11-03 LAB — MAGNESIUM: Magnesium: 1.3 mg/dL — ABNORMAL LOW (ref 1.7–2.4)

## 2017-11-03 MED ORDER — POTASSIUM CHLORIDE CRYS ER 20 MEQ PO TBCR
40.0000 meq | EXTENDED_RELEASE_TABLET | Freq: Once | ORAL | Status: AC
  Administered 2017-11-03: 40 meq via ORAL
  Filled 2017-11-03: qty 2

## 2017-11-03 MED ORDER — MAGNESIUM SULFATE 4 GM/100ML IV SOLN
4.0000 g | Freq: Once | INTRAVENOUS | Status: AC
  Administered 2017-11-03: 4 g via INTRAVENOUS
  Filled 2017-11-03: qty 100

## 2017-11-03 MED ORDER — TBO-FILGRASTIM 300 MCG/0.5ML ~~LOC~~ SOSY
300.0000 ug | PREFILLED_SYRINGE | Freq: Once | SUBCUTANEOUS | Status: AC
  Administered 2017-11-03: 300 ug via SUBCUTANEOUS
  Filled 2017-11-03: qty 0.5

## 2017-11-03 NOTE — Evaluation (Signed)
Physical Therapy Evaluation Patient Details Name: Theresa [name] MRN: [id number] DOB: 1960/10/05 Today's Date: 11/03/2017   History of Present Illness  57 yo female admitted with intractable nausea, vomiting, sinus tachycardia. + for flu. Hx of met NSCLC-on chemo,neuropathy    Clinical Impression  On eval, pt required Min assist for ambulation due to unsteadiness and LOB x 1. She relied on IV pole for support/stability. HR 116 bpm at rest; 133 bpm with ambulation. Will continue to follow during hospital stay. Recommend daily hallway ambulation with nursing supervision/assist as able.     Follow Up Recommendations Supervision for mobility/OOB    Equipment Recommendations  None recommended by PT    Recommendations for Other Services       Precautions / Restrictions Precautions Precautions: Fall Precaution Comments: droplet Restrictions Weight Bearing Restrictions: No      Mobility  Bed Mobility Overal bed mobility: Modified Independent                Transfers Overall transfer level: Needs assistance   Transfers: Sit to/from Stand Sit to Stand: Min guard         General transfer comment: Pt used IV pole to assist her into standing. close guard for safety  Ambulation/Gait Ambulation/Gait assistance: Min assist Ambulation Distance (Feet): 60 Feet Assistive device: (IV pole) Gait Pattern/deviations: Step-through pattern;Staggering left;Staggering right;Drifts right/left;Decreased stride length     General Gait Details: Unsteady. LOb x 1 during turn/change in direction-required Min assist to prevent fall. Pt relied on IV pole for support  Stairs            Wheelchair Mobility    Modified Rankin (Stroke Patients Only)       Balance Overall balance assessment: Needs assistance           Standing balance-Leahy Scale: Fair                               Pertinent Vitals/Pain Pain Assessment: No/denies pain    Home Living  Family/patient expects to be discharged to:: Private residence     Type of Home: House Home Access: Level entry     Home Layout: One level Home Equipment: None      Prior Function                 Hand Dominance        Extremity/Trunk Assessment   Upper Extremity Assessment Upper Extremity Assessment: Overall WFL for tasks assessed    Lower Extremity Assessment Lower Extremity Assessment: (pt reports LE neuropathy)    Cervical / Trunk Assessment Cervical / Trunk Assessment: Normal  Communication   Communication: No difficulties  Cognition Arousal/Alertness: Awake/alert Behavior During Therapy: WFL for tasks assessed/performed Overall Cognitive Status: Within Functional Limits for tasks assessed                                        General Comments      Exercises     Assessment/Plan    PT Assessment Patient needs continued PT services  PT Problem List Decreased balance;Decreased mobility;Decreased activity tolerance       PT Treatment Interventions Gait training;Therapeutic activities;Therapeutic exercise;Balance training;Patient/family education;Functional mobility training    PT Goals (Current goals can be found in the Care Plan section)  Acute Rehab PT Goals Patient Stated Goal: home. regain plof.  PT Goal  Formulation: With patient Time For Goal Achievement: 11/17/17 Potential to Achieve Goals: Good    Frequency Min 3X/week   Barriers to discharge        Co-evaluation               AM-PAC PT "6 Clicks" Daily Activity  Outcome Measure Difficulty turning over in bed (including adjusting bedclothes, sheets and blankets)?: None Difficulty moving from lying on back to sitting on the side of the bed? : None Difficulty sitting down on and standing up from a chair with arms (e.g., wheelchair, bedside commode, etc,.)?: A Little Help needed moving to and from a bed to chair (including a wheelchair)?: A Little Help needed  walking in hospital room?: A Little Help needed climbing 3-5 steps with a railing? : A Little 6 Click Score: 20    End of Session Equipment Utilized During Treatment: Gait belt Activity Tolerance: Patient tolerated treatment well Patient left: with call bell/phone within reach   PT Visit Diagnosis: Difficulty in walking, not elsewhere classified (R26.2)    Time: 6773-7366 PT Time Calculation (min) (ACUTE ONLY): 21 min   Charges:   PT Evaluation $PT Eval Moderate Complexity: 1 Mod     PT G Codes:          Weston Anna, MPT Pager: [phone]

## 2017-11-03 NOTE — Progress Notes (Signed)
These preliminary result these preliminary results were noted.  Awaiting final report.

## 2017-11-03 NOTE — Progress Notes (Signed)
PROGRESS NOTE  Theresa [name] TGG:[id number] DOB: 04-Dec-1960 DOA: 11/01/2017 PCP: [name], PA-C  Brief Summary:  Metastatic lung cancer on chemo, last on 2/5, presented with n/v, low-grade fever.  + flu.  on Tamiflu. Pancytopenia, on GCSF Likely will need therapeutic thoracentesis once plt improves.   HPI/Recap of past 24 hours:  -No fever since admitted to the hospital,  -sinus tachycardia improved on lopressor -feeling sob, denies chest pain, no hypoxia at rest. She prefers to lay on her right side due to sob laying flat or on her left -denies ab pain, n/v has resolved, she reports pasty stoolx2 in the last 24hrs.  Assessment/Plan: Principal Problem:   Intractable nausea and vomiting Active Problems:   Non-small cell carcinoma of lung, stage 3 (HCC)   Antineoplastic chemotherapy induced pancytopenia (HCC)   Dehydration  Influenza A:  -presented with n/v, low grade fever, report sick contact at home -Started on tamiflu  Sinus tachycardia: Improved on low dose lopressor with holding parameters  Pancytopenia, Last chemo with palicaxol and cisplatin on 2/5 neuropenia  Neutrophil 400, neuropenic precaution, S/p gcsf  on 2/12 and 2/13 Thrombocytopenia  plt 53, no sign of bleeding, start scd's  anemia associated with maliganancy: hgb 8.3 Supportive Transfusion  as needed, hematology/oncology Dr Julien Nordmann notified from epic  Metastatic non-small cell lung cancer with mets to right posterior chest wall, malignant pleural effusion ( last thoracentesis was in 07/2017 , reported filled 2.5 bottles) -Reports feeling sob, currently no hypoxia -cxr personally reviewed "Persistent moderate to large right-sided pleural effusion" -Will likely need thoracentesis this hospitalization once plt improves   Mild elevation of lft ast 52, denies abdominal pain,  CT ab from 2/4 "Hepatobiliary: Low-attenuation lesions in the liver measure up to 10 mm in the dome, as before. Liver and  gallbladder are otherwise unremarkable. No biliary ductal dilatation" Repeat lab in am  Hypokalemia/hypomagnesemia: Replace k/mag Repeat lab in am   Code Status: full  Family Communication: patient and significant other at bedside with patient's permission  Disposition Plan: home once medically stable in a few days   Consultants:  Hematology/Oncology  Dr Julien Nordmann notified through epic  Procedures:  none  Antibiotics:  none   Objective: BP (!) 105/59 (BP Location: Left Arm)   Pulse (!) 105   Temp 98.6 F (37 C) (Oral)   Resp 20   Ht 5\' 8"  (1.727 m)   Wt 92.7 kg (204 lb 4.8 oz)   SpO2 95%   BMI 31.06 kg/m   Intake/Output Summary (Last 24 hours) at 11/03/2017 0825 Last data filed at 11/03/2017 0457 Gross per 24 hour  Intake 10 ml  Output 1200 ml  Net -1190 ml   Filed Weights   11/01/17 1903 11/02/17 0527 11/03/17 0616  Weight: 92.5 kg (204 lb) 92.7 kg (204 lb 5.9 oz) 92.7 kg (204 lb 4.8 oz)    Exam: Patient is examined daily including today on 11/03/2017, exams remain the same as of yesterday except that has changed    General:  NAD  Cardiovascular: less sinus tachycardia   Respiratory: diminished on the right, no wheezing, no rhonchi  Abdomen: Soft/ND/NT, positive BS  Musculoskeletal: No Edema  Neuro: alert, oriented   Data Reviewed: Basic Metabolic Panel: Recent Labs  Lab 11/01/17 1255 11/01/17 1613 11/02/17 0313 11/03/17 0454  NA 132* 134* 136 138  K 3.6 3.6 3.8 3.2*  CL 97* 102 103 105  CO2 23 23 24 23   GLUCOSE 117 127 147* 90  BUN  10 9 12 9   CREATININE 0.81 0.82 0.63 0.69  CALCIUM 9.1 8.5 8.6* 8.5*  MG  --   --   --  1.3*   Liver Function Tests: Recent Labs  Lab 11/01/17 1255 11/01/17 1613 11/03/17 0454  AST 45* 41* 52*  ALT 19 15 32  ALKPHOS 91 80 63  BILITOT 1.2 1.0 0.4  PROT 8.0 7.1 6.2*  ALBUMIN 3.7 3.3* 3.1*   No results for input(s): LIPASE, AMYLASE in the last 168 hours. No results for input(s): AMMONIA in the  last 168 hours. CBC: Recent Labs  Lab 11/01/17 1255 11/02/17 0313 11/03/17 0454  WBC 1.0* 0.9* 1.0*  NEUTROABS 0.6*  --  0.4*  HGB  --  8.9* 8.3*  HCT 32.1* 24.7* 23.0*  MCV 96.1 92.2 92.7  PLT 77* 53* 39*   Cardiac Enzymes:   No results for input(s): CKTOTAL, CKMB, CKMBINDEX, TROPONINI in the last 168 hours. BNP (last 3 results) No results for input(s): BNP in the last 8760 hours.  ProBNP (last 3 results) No results for input(s): PROBNP in the last 8760 hours.  CBG: No results for input(s): GLUCAP in the last 168 hours.  Recent Results (from the past 240 hour(s))  Culture, Blood     Status: None (Preliminary result)   Collection Time: 11/01/17  4:30 PM  Result Value Ref Range Status   Specimen Description   Final    BLOOD LEFT ARM Performed at [facility], [address], [place], [place] [address]    Special Requests   Final    BOTTLES DRAWN AEROBIC AND ANAEROBIC Blood Culture results may not be optimal due to an excessive volume of blood received in culture bottles   Culture   Final    NO GROWTH < 24 HOURS Performed at [facility] [address], [place], [place] [address]    Report Status PENDING  Incomplete  Culture, Blood     Status: None (Preliminary result)   Collection Time: 11/01/17  4:34 PM  Result Value Ref Range Status   Specimen Description BLOOD PORTA CATH  Final   Special Requests   Final    BOTTLES DRAWN AEROBIC AND ANAEROBIC Blood Culture adequate volume   Culture   Final    NO GROWTH < 24 HOURS Performed at [facility], [place] [address] [place], [place] [address]    Report Status PENDING  Incomplete  Respiratory Panel by PCR     Status: Abnormal   Collection Time: 11/01/17 10:48 PM  Result Value Ref Range Status   Adenovirus NOT DETECTED NOT DETECTED Final   Coronavirus 229E NOT DETECTED NOT DETECTED Final   Coronavirus HKU1 NOT DETECTED NOT DETECTED Final   Coronavirus NL63 NOT DETECTED NOT DETECTED  Final   Coronavirus OC43 NOT DETECTED NOT DETECTED Final   Metapneumovirus NOT DETECTED NOT DETECTED Final   Rhinovirus / Enterovirus NOT DETECTED NOT DETECTED Final   Influenza A NOT DETECTED NOT DETECTED Final   Influenza A H1 NOT DETECTED NOT DETECTED Final   Influenza A H1 2009 NOT DETECTED NOT DETECTED Final   Influenza A H3 DETECTED (A) NOT DETECTED Final   Influenza B NOT DETECTED NOT DETECTED Final   Parainfluenza Virus 1 NOT DETECTED NOT DETECTED Final   Parainfluenza Virus 2 NOT DETECTED NOT DETECTED Final   Parainfluenza Virus 3 NOT DETECTED NOT DETECTED Final   Parainfluenza Virus 4 NOT DETECTED NOT DETECTED Final   Respiratory Syncytial Virus  NOT DETECTED NOT DETECTED Final   Bordetella pertussis NOT DETECTED NOT DETECTED Final   Chlamydophila pneumoniae NOT DETECTED NOT DETECTED Final   Mycoplasma pneumoniae NOT DETECTED NOT DETECTED Final    Comment: Performed at [facility], [place] [address], [place], [place] [address]  MRSA PCR Screening     Status: None   Collection Time: 11/02/17 10:50 AM  Result Value Ref Range Status   MRSA by PCR NEGATIVE NEGATIVE Final    Comment:        The GeneXpert MRSA Assay (FDA approved for NASAL specimens only), is one component of a comprehensive MRSA colonization surveillance program. It is not intended to diagnose MRSA infection nor to guide or monitor treatment for MRSA infections. Performed at [facility], [place] [address] [place], [place] [address]      Studies: No results found.  Scheduled Meds: . fluticasone  1 spray Each Nare Daily  . gabapentin  200 mg Oral TID  . loratadine  10 mg Oral Daily  . metoprolol tartrate  12.5 mg Oral BID  . oseltamivir  75 mg Oral BID  . potassium chloride SA  10 mEq Oral Daily  . potassium chloride  40 mEq Oral Once    Continuous Infusions: . magnesium sulfate 1 - 4 g bolus IVPB       Time spent: 35 mins I have personally reviewed and interpreted on   11/03/2017 daily labs, tele strips, imagings as discussed above under date review session and assessment and plans.  I reviewed all nursing notes,  vitals, pertinent old records  I have discussed plan of care as described above with RN , patient and significant other with permission on 11/03/2017   Florencia Reasons MD, PhD  [facility] Pager [phone]. If 7PM-7AM, please contact night-coverage at [email or web address], password [facility] 11/03/2017, 8:25 AM  LOS: 2 days

## 2017-11-04 ENCOUNTER — Other Ambulatory Visit: Payer: Self-pay | Admitting: *Deleted

## 2017-11-04 ENCOUNTER — Telehealth: Payer: Self-pay | Admitting: *Deleted

## 2017-11-04 ENCOUNTER — Telehealth: Payer: Self-pay | Admitting: Medical Oncology

## 2017-11-04 DIAGNOSIS — C342 Malignant neoplasm of middle lobe, bronchus or lung: Secondary | ICD-10-CM

## 2017-11-04 LAB — CBC WITH DIFFERENTIAL/PLATELET
BASOS ABS: 0 10*3/uL (ref 0.0–0.1)
BASOS PCT: 0 %
EOS ABS: 0 10*3/uL (ref 0.0–0.7)
Eosinophils Relative: 0 %
HCT: 24.6 % — ABNORMAL LOW (ref 36.0–46.0)
Hemoglobin: 8.6 g/dL — ABNORMAL LOW (ref 12.0–15.0)
Lymphocytes Relative: 17 %
Lymphs Abs: 0.4 10*3/uL — ABNORMAL LOW (ref 0.7–4.0)
MCH: 32.6 pg (ref 26.0–34.0)
MCHC: 35 g/dL (ref 30.0–36.0)
MCV: 93.2 fL (ref 78.0–100.0)
MONO ABS: 0.6 10*3/uL (ref 0.1–1.0)
Monocytes Relative: 22 %
NEUTROS PCT: 61 %
Neutro Abs: 1.5 10*3/uL — ABNORMAL LOW (ref 1.7–7.7)
PLATELETS: 50 10*3/uL — AB (ref 150–400)
RBC: 2.64 MIL/uL — ABNORMAL LOW (ref 3.87–5.11)
RDW: 19.4 % — ABNORMAL HIGH (ref 11.5–15.5)
WBC: 2.5 10*3/uL — AB (ref 4.0–10.5)

## 2017-11-04 LAB — HEPATIC FUNCTION PANEL
ALT: 36 U/L (ref 14–54)
AST: 48 U/L — ABNORMAL HIGH (ref 15–41)
Albumin: 3.1 g/dL — ABNORMAL LOW (ref 3.5–5.0)
Alkaline Phosphatase: 71 U/L (ref 38–126)
BILIRUBIN DIRECT: 0.1 mg/dL (ref 0.1–0.5)
BILIRUBIN INDIRECT: 0.5 mg/dL (ref 0.3–0.9)
Total Bilirubin: 0.6 mg/dL (ref 0.3–1.2)
Total Protein: 6.7 g/dL (ref 6.5–8.1)

## 2017-11-04 LAB — MAGNESIUM: MAGNESIUM: 1.4 mg/dL — AB (ref 1.7–2.4)

## 2017-11-04 LAB — BASIC METABOLIC PANEL
Anion gap: 11 (ref 5–15)
BUN: 9 mg/dL (ref 6–20)
CO2: 25 mmol/L (ref 22–32)
CREATININE: 0.68 mg/dL (ref 0.44–1.00)
Calcium: 8.7 mg/dL — ABNORMAL LOW (ref 8.9–10.3)
Chloride: 104 mmol/L (ref 101–111)
Glucose, Bld: 105 mg/dL — ABNORMAL HIGH (ref 65–99)
POTASSIUM: 3.2 mmol/L — AB (ref 3.5–5.1)
SODIUM: 140 mmol/L (ref 135–145)

## 2017-11-04 MED ORDER — HEPARIN SOD (PORK) LOCK FLUSH 100 UNIT/ML IV SOLN
500.0000 [IU] | INTRAVENOUS | Status: AC | PRN
  Administered 2017-11-04: 500 [IU]

## 2017-11-04 MED ORDER — BENZONATATE 100 MG PO CAPS
100.0000 mg | ORAL_CAPSULE | Freq: Two times a day (BID) | ORAL | Status: DC | PRN
  Administered 2017-11-04: 100 mg via ORAL
  Filled 2017-11-04: qty 1

## 2017-11-04 MED ORDER — OSELTAMIVIR PHOSPHATE 75 MG PO CAPS: 75.0000 mg | ORAL_CAPSULE | Freq: Two times a day (BID) | ORAL | 0 refills | Status: DC

## 2017-11-04 MED ORDER — ZOLPIDEM TARTRATE 5 MG PO TABS
5.0000 mg | ORAL_TABLET | Freq: Every evening | ORAL | Status: DC | PRN
  Administered 2017-11-04: 5 mg via ORAL
  Filled 2017-11-04: qty 1

## 2017-11-04 MED ORDER — METOPROLOL TARTRATE 25 MG PO TABS: 12.5000 mg | ORAL_TABLET | Freq: Two times a day (BID) | ORAL | 0 refills | Status: DC

## 2017-11-04 MED ORDER — BENZONATATE 100 MG PO CAPS: 100.0000 mg | ORAL_CAPSULE | Freq: Two times a day (BID) | ORAL | 0 refills | Status: DC | PRN

## 2017-11-04 NOTE — Care Management Note (Signed)
Case Management Note  Patient Details  Name: Theresa [name] MRN: [id number] Date of Birth: [birth year]  Subjective/Objective:  No CM needs.                  Action/Plan:d/c home.   Expected Discharge Date:                  Expected Discharge Plan:  Home/Self Care  In-House Referral:     Discharge planning Services  CM Consult  Post Acute Care Choice:    Choice offered to:     DME Arranged:    DME Agency:     HH Arranged:    House Agency:     Status of Service:  Completed, signed off  If discussed at [employer] of Stay Meetings, dates discussed:    Additional Comments:  Dessa Phi, RN 11/04/2017, 10:37 AM

## 2017-11-04 NOTE — Care Management Important Message (Signed)
Important Message  Patient Details IM Letter given to Kathy/Case Manager to present to the Patient Name: Theresa [name] MRN: [id number] Date of Birth: October 27, 1960   Medicare Important Message Given:  Yes    Kerin Salen 11/04/2017, 10:26 AM

## 2017-11-04 NOTE — Progress Notes (Signed)
These preliminary result these preliminary results were noted.  Awaiting final report.

## 2017-11-04 NOTE — Telephone Encounter (Signed)
  Pt  would like to get a thoracentesis asap. Note to [place].

## 2017-11-04 NOTE — Discharge Summary (Signed)
Physician Discharge Summary  Theresa [name] VQM:[id number] DOB: 10-10-1960 DOA: 11/01/2017  PCP: [name], PA-C  Admit date: 11/01/2017 Discharge date: 11/04/2017  Time spent: > 35 minutes  Recommendations for Outpatient Follow-up:  1. Follow up with your oncologist for further evaluation and recommendations.   Discharge Diagnoses:  Principal Problem:   Intractable nausea and vomiting Active Problems:   Non-small cell carcinoma of lung, stage 3 (HCC)   Antineoplastic chemotherapy induced pancytopenia ([place])   Dehydration   Discharge Condition: stable  Diet recommendation: Heart healthy  Filed Weights   11/01/17 1903 11/02/17 0527 11/03/17 0616  Weight: 92.5 kg (204 lb) 92.7 kg (204 lb 5.9 oz) 92.7 kg (204 lb 4.8 oz)    History of present illness:  Metastatic lung cancer on chemo, last on 2/5, presented with n/v, low-grade fever.  + flu.  on Tamiflu. Pancytopenia, on GCSF Likely will need therapeutic thoracentesis once plt improves.  Hospital Course:  Influenza A:  -presented with n/v, low grade fever, report sick contact at home -Started on tamiflu - will need to complete a 5 day treatment course.  Metastatic non-small cell lung cancer with mets to right posterior chest wall, malignant pleural effusion ( last thoracentesis was in 07/2017 , reported filled 2.5 bottles) - currently no hypoxia, patient would not like supplemental oxygen even if she qualified for it. - cxr personally reviewed "Persistent moderate to large right-sided pleural effusion" - Will likely need thoracentesis this hospitalization once plt improves can be set up as outpatient. Pt to see oncologist tomorrow.   Procedures:  none  Consultations:  none  Discharge Exam: Vitals:   11/03/17 2230 11/04/17 0536  BP: 119/69 112/61  Pulse: (!) 118 (!) 108  Resp: 20 20  Temp: 99.6 F (37.6 C) 98.7 F (37.1 C)  SpO2: 99% 96%    General: Pt in nad, alert and awake Cardiovascular: rrr, no  rubs Respiratory: no increased wob, speaking in full sentences, decreased breath sounds over right lung field.  Discharge Instructions    Allergies as of 11/04/2017   No Known Allergies     Medication List    TAKE these medications   diphenoxylate-atropine 2.5-0.025 MG tablet Commonly known as:  LOMOTIL Take 2 tablets by mouth 4 (four) times daily as needed for diarrhea or loose stools.   gabapentin 100 MG capsule Commonly known as:  NEURONTIN Take 2 capsules (200 mg total) by mouth 3 (three) times daily.   ibuprofen 800 MG tablet Commonly known as:  ADVIL,MOTRIN Take 1 tablet by mouth every 8 (eight) hours as needed.   lactulose 10 GM/15ML solution Commonly known as:  CHRONULAC Take 15 mLs (10 g total) by mouth 3 (three) times daily as needed for mild constipation.   lidocaine-prilocaine cream Commonly known as:  EMLA Apply 1 application topically as needed.   LORazepam 0.5 MG tablet Commonly known as:  ATIVAN 0.5 mg sublingual p.o. every 6 hours as needed for nausea and/or vomiting.   metoprolol tartrate 25 MG tablet Commonly known as:  LOPRESSOR Take 0.5 tablets (12.5 mg total) by mouth 2 (two) times daily.   multivitamin capsule Take 1 capsule daily by mouth.   ondansetron 8 MG disintegrating tablet Commonly known as:  ZOFRAN-ODT Take 1 tablet (8 mg total) by mouth every 8 (eight) hours as needed for nausea or vomiting.   oseltamivir 75 MG capsule Commonly known as:  TAMIFLU Take 1 capsule (75 mg total) by mouth 2 (two) times daily.   oxyCODONE 5 MG  immediate release tablet Commonly known as:  Oxy IR/ROXICODONE Take 1 tablet (5 mg total) by mouth every 6 (six) hours as needed for severe pain.   potassium chloride SA 20 MEQ tablet Commonly known as:  K-DUR,KLOR-CON Take 2 tablets twice a day for 5 days then 1 tablet daily.   prochlorperazine 10 MG tablet Commonly known as:  COMPAZINE Take 1 tablet (10 mg total) by mouth every 6 (six) hours as needed  for nausea or vomiting.      No Known Allergies    The results of significant diagnostics from this hospitalization (including imaging, microbiology, ancillary and laboratory) are listed below for reference.    Significant Diagnostic Studies: Dg Chest 2 View  Result Date: 11/01/2017 CLINICAL DATA:  Acute onset of cough. Patient on chemotherapy for lung cancer. EXAM: CHEST  2 VIEW COMPARISON:  Chest radiograph performed 08/03/2017, and CT of the chest performed 10/25/2017 FINDINGS: There is a persistent moderate to large right-sided pleural effusion, reflecting the underlying malignancy. The left lung appears relatively clear. No pneumothorax is seen. The heart is normal in size, though difficult to fully assess given the pleural effusion. A right-sided chest port is noted ending about the mid SVC. No acute osseous abnormalities are seen. IMPRESSION: Persistent moderate to large right-sided pleural effusion, reflecting underlying malignancy. Electronically Signed   By: Garald Balding M.D.   On: 11/01/2017 22:09   Ct Chest W Contrast  Result Date: 10/25/2017 CLINICAL DATA:  Non-small cell lung cancer, restaging, ongoing chemotherapy. EXAM: CT CHEST, ABDOMEN, AND PELVIS WITH CONTRAST TECHNIQUE: Multidetector CT imaging of the chest, abdomen and pelvis was performed following the standard protocol during bolus administration of intravenous contrast. CONTRAST:  100 cc Isovue-300. COMPARISON:  PET 05/05/2017 and CT chest 04/19/2017. CT abdomen pelvis 03/11/2017. FINDINGS: CT CHEST FINDINGS Cardiovascular: Right IJ Port-A-Cath terminates in the right atrium. Aberrant right subclavian artery. Heart size normal. No pericardial effusion. Mediastinum/Nodes: No pathologically enlarged mediastinal, hilar or axillary lymph nodes. Esophagus is grossly unremarkable. Lungs/Pleura: Large right pleural effusion, increased from 05/05/2017, with post radiation changes in the medial right hemithorax. New hyperattenuating  extrapleural thickening in the posterior right hemithorax, at the level of the right ninth and tenth ribs. Patchy peribronchovascular consolidation and ground-glass in the medial left lower lobe, new. No left pleural fluid. Airway is otherwise unremarkable. Musculoskeletal: Slight lytic destruction of the posterior right ninth rib, new. Large posterior right chest wall mass measures 4.8 x 9.7 cm, grossly stable from 05/05/2017. A 2.0 x 3.0 cm soft tissue mass lateral to the inferior right scapula (series 2, image 23) appears new from 04/19/2017. Mildly hyperattenuating soft tissue nodules are seen posterior to the right ninth and tenth intercostal space, with hyperattenuating soft tissue extending into the intercostal region and likely extrapleural space, new or better visualized on the current exam. CT ABDOMEN PELVIS FINDINGS Hepatobiliary: Low-attenuation lesions in the liver measure up to 10 mm in the dome, as before. Liver and gallbladder are otherwise unremarkable. No biliary ductal dilatation. Pancreas: Negative. Spleen: Negative. Adrenals/Urinary Tract: Adrenal glands and kidneys are unremarkable. Ureters are decompressed. Bladder is grossly unremarkable. Stomach/Bowel: Stomach is decompressed. Small bowel and appendix are unremarkable. Stool is seen in the majority of the colon, indicative of constipation. Colon is otherwise unremarkable. Vascular/Lymphatic: Atherosclerotic calcification of the arterial vasculature without abdominal aortic aneurysm. No pathologically enlarged lymph nodes. Reproductive: Uterus is visualized. Fat containing lesion in the right adnexa measures up to 5.8 cm, slightly enlarged from 4.9 cm on 03/11/2017. Other:  No free fluid.  Mesenteries and peritoneum are unremarkable. Musculoskeletal: No additional worrisome lytic or sclerotic lesions. IMPRESSION: 1. Progressive right posterior chest wall metastatic disease with probable infiltration of the right 9/10 intercostal space, into  the posterior extrapleural space of the right hemithorax. 2. New lytic destruction of the posterior right ninth rib. 3. Large right pleural effusion, markedly increased from prior. 4. New peribronchovascular ground-glass and consolidation in the left lower lobe, likely infectious or inflammatory in etiology. 5.  Aortic atherosclerosis (ICD10-170.0). 6. Fat containing lesion in the right adnexa measures slightly larger than on 03/11/2017 and may represent an ovarian dermoid. Electronically Signed   By: Lorin Picket M.D.   On: 10/25/2017 12:57   Ct Abdomen Pelvis W Contrast  Result Date: 10/25/2017 CLINICAL DATA:  Non-small cell lung cancer, restaging, ongoing chemotherapy. EXAM: CT CHEST, ABDOMEN, AND PELVIS WITH CONTRAST TECHNIQUE: Multidetector CT imaging of the chest, abdomen and pelvis was performed following the standard protocol during bolus administration of intravenous contrast. CONTRAST:  100 cc Isovue-300. COMPARISON:  PET 05/05/2017 and CT chest 04/19/2017. CT abdomen pelvis 03/11/2017. FINDINGS: CT CHEST FINDINGS Cardiovascular: Right IJ Port-A-Cath terminates in the right atrium. Aberrant right subclavian artery. Heart size normal. No pericardial effusion. Mediastinum/Nodes: No pathologically enlarged mediastinal, hilar or axillary lymph nodes. Esophagus is grossly unremarkable. Lungs/Pleura: Large right pleural effusion, increased from 05/05/2017, with post radiation changes in the medial right hemithorax. New hyperattenuating extrapleural thickening in the posterior right hemithorax, at the level of the right ninth and tenth ribs. Patchy peribronchovascular consolidation and ground-glass in the medial left lower lobe, new. No left pleural fluid. Airway is otherwise unremarkable. Musculoskeletal: Slight lytic destruction of the posterior right ninth rib, new. Large posterior right chest wall mass measures 4.8 x 9.7 cm, grossly stable from 05/05/2017. A 2.0 x 3.0 cm soft tissue mass lateral to the  inferior right scapula (series 2, image 23) appears new from 04/19/2017. Mildly hyperattenuating soft tissue nodules are seen posterior to the right ninth and tenth intercostal space, with hyperattenuating soft tissue extending into the intercostal region and likely extrapleural space, new or better visualized on the current exam. CT ABDOMEN PELVIS FINDINGS Hepatobiliary: Low-attenuation lesions in the liver measure up to 10 mm in the dome, as before. Liver and gallbladder are otherwise unremarkable. No biliary ductal dilatation. Pancreas: Negative. Spleen: Negative. Adrenals/Urinary Tract: Adrenal glands and kidneys are unremarkable. Ureters are decompressed. Bladder is grossly unremarkable. Stomach/Bowel: Stomach is decompressed. Small bowel and appendix are unremarkable. Stool is seen in the majority of the colon, indicative of constipation. Colon is otherwise unremarkable. Vascular/Lymphatic: Atherosclerotic calcification of the arterial vasculature without abdominal aortic aneurysm. No pathologically enlarged lymph nodes. Reproductive: Uterus is visualized. Fat containing lesion in the right adnexa measures up to 5.8 cm, slightly enlarged from 4.9 cm on 03/11/2017. Other: No free fluid.  Mesenteries and peritoneum are unremarkable. Musculoskeletal: No additional worrisome lytic or sclerotic lesions. IMPRESSION: 1. Progressive right posterior chest wall metastatic disease with probable infiltration of the right 9/10 intercostal space, into the posterior extrapleural space of the right hemithorax. 2. New lytic destruction of the posterior right ninth rib. 3. Large right pleural effusion, markedly increased from prior. 4. New peribronchovascular ground-glass and consolidation in the left lower lobe, likely infectious or inflammatory in etiology. 5.  Aortic atherosclerosis (ICD10-170.0). 6. Fat containing lesion in the right adnexa measures slightly larger than on 03/11/2017 and may represent an ovarian dermoid.  Electronically Signed   By: Lorin Picket M.D.   On:  10/25/2017 12:57    Microbiology: Recent Results (from the past 240 hour(s))  Culture, Blood     Status: None (Preliminary result)   Collection Time: 11/01/17  4:30 PM  Result Value Ref Range Status   Specimen Description   Final    BLOOD LEFT ARM Performed at [facility], [address], [place], [place] [address]    Special Requests   Final    BOTTLES DRAWN AEROBIC AND ANAEROBIC Blood Culture results may not be optimal due to an excessive volume of blood received in culture bottles   Culture   Final    NO GROWTH 3 DAYS Performed at [facility], [place] [address], [place], [place] [address]    Report Status PENDING  Incomplete  Culture, Blood     Status: None (Preliminary result)   Collection Time: 11/01/17  4:34 PM  Result Value Ref Range Status   Specimen Description BLOOD PORTA CATH  Final   Special Requests   Final    BOTTLES DRAWN AEROBIC AND ANAEROBIC Blood Culture adequate volume   Culture   Final    NO GROWTH 3 DAYS Performed at [facility], [address], [place], [place] [address]    Report Status PENDING  Incomplete  Respiratory Panel by PCR     Status: Abnormal   Collection Time: 11/01/17 10:48 PM  Result Value Ref Range Status   Adenovirus NOT DETECTED NOT DETECTED Final   Coronavirus 229E NOT DETECTED NOT DETECTED Final   Coronavirus HKU1 NOT DETECTED NOT DETECTED Final   Coronavirus NL63 NOT DETECTED NOT DETECTED Final   Coronavirus OC43 NOT DETECTED NOT DETECTED Final   Metapneumovirus NOT DETECTED NOT DETECTED Final   Rhinovirus / Enterovirus NOT DETECTED NOT DETECTED Final   Influenza A NOT DETECTED NOT DETECTED Final   Influenza A H1 NOT DETECTED NOT DETECTED Final   Influenza A H1 2009 NOT DETECTED NOT DETECTED Final   Influenza A H3 DETECTED (A) NOT DETECTED Final   Influenza B NOT DETECTED NOT DETECTED Final   Parainfluenza Virus 1 NOT DETECTED NOT DETECTED  Final   Parainfluenza Virus 2 NOT DETECTED NOT DETECTED Final   Parainfluenza Virus 3 NOT DETECTED NOT DETECTED Final   Parainfluenza Virus 4 NOT DETECTED NOT DETECTED Final   Respiratory Syncytial Virus NOT DETECTED NOT DETECTED Final   Bordetella pertussis NOT DETECTED NOT DETECTED Final   Chlamydophila pneumoniae NOT DETECTED NOT DETECTED Final   Mycoplasma pneumoniae NOT DETECTED NOT DETECTED Final    Comment: Performed at [facility], [address] [place] [address], [place], [place] [address]  MRSA PCR Screening     Status: None   Collection Time: 11/02/17 10:50 AM  Result Value Ref Range Status   MRSA by PCR NEGATIVE NEGATIVE Final    Comment:        The GeneXpert MRSA Assay (FDA approved for NASAL specimens only), is one component of a comprehensive MRSA colonization surveillance program. It is not intended to diagnose MRSA infection nor to guide or monitor treatment for MRSA infections. Performed at [facility], [place] [address], [place], [place] [address]      Labs: Basic Metabolic Panel: Recent Labs  Lab 11/01/17 1255 11/01/17 1613 11/02/17 0313 11/03/17 0454 11/04/17 0432  NA 132* 134* 136 138 140  K 3.6 3.6 3.8 3.2* 3.2*  CL 97* 102 103 105 104  CO2 23 23 24 23 25   GLUCOSE 117 127 147* 90 105*  BUN 10 9 12 9 9   CREATININE 0.81 0.82 0.63 0.69 0.68  CALCIUM 9.1 8.5 8.6* 8.5* 8.7*  MG  --   --   --  1.3* 1.4*   Liver Function Tests: Recent Labs  Lab 11/01/17 1255 11/01/17 1613 11/03/17 0454 11/04/17 0432  AST 45* 41* 52* 48*  ALT 19 15 32 36  ALKPHOS 91 80 63 71  BILITOT 1.2 1.0 0.4 0.6  PROT 8.0 7.1 6.2* 6.7  ALBUMIN 3.7 3.3* 3.1* 3.1*   No results for input(s): LIPASE, AMYLASE in the last 168 hours. No results for input(s): AMMONIA in the last 168 hours. CBC: Recent Labs  Lab 11/01/17 1255 11/02/17 0313 11/03/17 0454 11/04/17 0432  WBC 1.0* 0.9* 1.0* 2.5*  NEUTROABS 0.6*  --  0.4* 1.5*  HGB  --  8.9* 8.3* 8.6*  HCT 32.1*  24.7* 23.0* 24.6*  MCV 96.1 92.2 92.7 93.2  PLT 77* 53* 39* 50*   Cardiac Enzymes: No results for input(s): CKTOTAL, CKMB, CKMBINDEX, TROPONINI in the last 168 hours. BNP: BNP (last 3 results) No results for input(s): BNP in the last 8760 hours.  ProBNP (last 3 results) No results for input(s): PROBNP in the last 8760 hours.  CBG: No results for input(s): GLUCAP in the last 168 hours.     Signed:  Velvet Bathe MD.  [facility] 11/04/2017, 4:31 PM

## 2017-11-05 ENCOUNTER — Ambulatory Visit (HOSPITAL_COMMUNITY)
Admission: RE | Admit: 2017-11-05 | Discharge: 2017-11-05 | Disposition: A | Discharge status: Home / Self Care | Payer: Medicare Other | Source: Ambulatory Visit | Attending: Radiology | Admitting: Radiology

## 2017-11-05 ENCOUNTER — Ambulatory Visit (HOSPITAL_COMMUNITY)
Admission: AD | Admit: 2017-11-05 | Discharge: 2017-11-05 | Disposition: A | Discharge status: Home / Self Care | Payer: Medicare Other | Source: Ambulatory Visit | Attending: Internal Medicine | Admitting: Internal Medicine

## 2017-11-05 ENCOUNTER — Telehealth: Payer: Self-pay

## 2017-11-05 DIAGNOSIS — Z9889 Other specified postprocedural states: Secondary | ICD-10-CM

## 2017-11-05 DIAGNOSIS — J90 Pleural effusion, not elsewhere classified: Secondary | ICD-10-CM | POA: Diagnosis not present

## 2017-11-05 DIAGNOSIS — C342 Malignant neoplasm of middle lobe, bronchus or lung: Secondary | ICD-10-CM

## 2017-11-05 DIAGNOSIS — R846 Abnormal cytological findings in specimens from respiratory organs and thorax: Secondary | ICD-10-CM | POA: Diagnosis not present

## 2017-11-05 MED ORDER — LIDOCAINE HCL 1 % IJ SOLN
INTRAMUSCULAR | Status: AC
  Filled 2017-11-05: qty 10

## 2017-11-05 NOTE — Telephone Encounter (Signed)
Transition Care Management Follow-up Telephone Call   Date discharged? 11/04/17   How have you been since you were released from the hospital? Patient states that she is doing fine since being released from the hospital.    Do you understand why you were in the hospital? yes   Do you understand the discharge instructions? yes   Where were you discharged to? home   Items Reviewed:  Medications reviewed: yes  Allergies reviewed: yes  Dietary changes reviewed: yes  Referrals reviewed: yes   Functional Questionnaire:   Activities of Daily Living (ADLs):   She states they are independent in the following: ambulation, bathing and hygiene, feeding, continence, grooming, toileting and dressing States they require assistance with the following: none   Any transportation issues/concerns?: no   Any patient concerns? no   Confirmed importance and date/time of follow-up visits scheduled yes  Provider Appointment booked with- Patient states that she will give [place] a call back on Monday to schedule her follow up appointment with [name]. She has to look at her schedule first.   Confirmed with patient if condition begins to worsen call PCP or go to the ER.  Patient was given the office number and encouraged to call back with question or concerns.  : yes

## 2017-11-05 NOTE — Procedures (Signed)
Ultrasound-guided diagnostic and therapeutic right thoracentesis performed yielding 1.5 liters of yellow fluid. No immediate complications. Follow-up chest x-ray pending.Only the above amount of fluid was removed today secondary to pt coughing/chest pressure.

## 2017-11-05 NOTE — Progress Notes (Signed)
These preliminary result these preliminary results were noted.  Awaiting final report.

## 2017-11-06 LAB — CULTURE, BLOOD (SINGLE)
Culture: NO GROWTH
Culture: NO GROWTH
Special Requests: ADEQUATE

## 2017-11-08 NOTE — Progress Notes (Signed)
These preliminary result these preliminary results were noted.  Awaiting final report.

## 2017-11-09 ENCOUNTER — Inpatient Hospital Stay: Payer: Medicare Other

## 2017-11-09 DIAGNOSIS — C3481 Malignant neoplasm of overlapping sites of right bronchus and lung: Secondary | ICD-10-CM | POA: Diagnosis not present

## 2017-11-09 DIAGNOSIS — C3491 Malignant neoplasm of unspecified part of right bronchus or lung: Secondary | ICD-10-CM

## 2017-11-09 DIAGNOSIS — E860 Dehydration: Secondary | ICD-10-CM

## 2017-11-09 DIAGNOSIS — R112 Nausea with vomiting, unspecified: Secondary | ICD-10-CM | POA: Diagnosis not present

## 2017-11-09 DIAGNOSIS — C7989 Secondary malignant neoplasm of other specified sites: Secondary | ICD-10-CM | POA: Diagnosis not present

## 2017-11-09 DIAGNOSIS — D701 Agranulocytosis secondary to cancer chemotherapy: Secondary | ICD-10-CM | POA: Diagnosis not present

## 2017-11-09 DIAGNOSIS — Z5111 Encounter for antineoplastic chemotherapy: Secondary | ICD-10-CM | POA: Diagnosis not present

## 2017-11-09 DIAGNOSIS — G629 Polyneuropathy, unspecified: Secondary | ICD-10-CM | POA: Diagnosis not present

## 2017-11-09 LAB — COMPREHENSIVE METABOLIC PANEL
ALT: 20 U/L (ref 0–55)
ANION GAP: 9 (ref 3–11)
AST: 26 U/L (ref 5–34)
Albumin: 3.3 g/dL — ABNORMAL LOW (ref 3.5–5.0)
Alkaline Phosphatase: 103 U/L (ref 40–150)
BUN: 8 mg/dL (ref 7–26)
CHLORIDE: 104 mmol/L (ref 98–109)
CO2: 26 mmol/L (ref 22–29)
CREATININE: 0.81 mg/dL (ref 0.60–1.10)
Calcium: 9.5 mg/dL (ref 8.4–10.4)
Glucose, Bld: 119 mg/dL (ref 70–140)
POTASSIUM: 3.5 mmol/L (ref 3.5–5.1)
SODIUM: 139 mmol/L (ref 136–145)
Total Bilirubin: 0.5 mg/dL (ref 0.2–1.2)
Total Protein: 7.4 g/dL (ref 6.4–8.3)

## 2017-11-09 LAB — CBC WITH DIFFERENTIAL/PLATELET
Basophils Absolute: 0 10*3/uL (ref 0.0–0.1)
Basophils Relative: 0 %
EOS ABS: 0 10*3/uL (ref 0.0–0.5)
Eosinophils Relative: 0 %
HEMATOCRIT: 28.2 % — AB (ref 34.8–46.6)
HEMOGLOBIN: 9.5 g/dL — AB (ref 11.6–15.9)
LYMPHS ABS: 0.5 10*3/uL — AB (ref 0.9–3.3)
LYMPHS PCT: 12 %
MCH: 32.1 pg (ref 25.1–34.0)
MCHC: 33.7 g/dL (ref 31.5–36.0)
MCV: 95.3 fL (ref 79.5–101.0)
MONOS PCT: 8 %
Monocytes Absolute: 0.3 10*3/uL (ref 0.1–0.9)
NEUTROS ABS: 3.5 10*3/uL (ref 1.5–6.5)
NEUTROS PCT: 80 %
Platelets: 90 10*3/uL — ABNORMAL LOW (ref 145–400)
RBC: 2.96 MIL/uL — AB (ref 3.70–5.45)
RDW: 20.4 % — ABNORMAL HIGH (ref 11.2–14.5)
WBC: 4.4 10*3/uL (ref 3.9–10.3)

## 2017-11-09 MED ORDER — HEPARIN SOD (PORK) LOCK FLUSH 100 UNIT/ML IV SOLN
500.0000 [IU] | Freq: Once | INTRAVENOUS | Status: AC | PRN
  Administered 2017-11-09: 500 [IU]
  Filled 2017-11-09: qty 5

## 2017-11-09 MED ORDER — SODIUM CHLORIDE 0.9% FLUSH
10.0000 mL | INTRAVENOUS | Status: DC | PRN
  Administered 2017-11-09: 10 mL
  Filled 2017-11-09: qty 10

## 2017-11-10 ENCOUNTER — Other Ambulatory Visit: Payer: Self-pay | Admitting: Medical Oncology

## 2017-11-10 DIAGNOSIS — C3491 Malignant neoplasm of unspecified part of right bronchus or lung: Secondary | ICD-10-CM

## 2017-11-10 MED ORDER — OXYCODONE HCL 5 MG PO TABS: 5.0000 mg | ORAL_TABLET | Freq: Four times a day (QID) | ORAL | 0 refills | Status: DC | PRN

## 2017-11-16 ENCOUNTER — Inpatient Hospital Stay: Payer: Medicare Other

## 2017-11-16 ENCOUNTER — Encounter: Payer: Self-pay | Admitting: Internal Medicine

## 2017-11-16 ENCOUNTER — Telehealth: Payer: Self-pay | Admitting: Internal Medicine

## 2017-11-16 ENCOUNTER — Inpatient Hospital Stay (HOSPITAL_BASED_OUTPATIENT_CLINIC_OR_DEPARTMENT_OTHER): Payer: Medicare Other | Admitting: Internal Medicine

## 2017-11-16 VITALS — BP 133/87 | HR 115 | Temp 98.6°F | Resp 19 | Ht 68.0 in | Wt 201.2 lb

## 2017-11-16 DIAGNOSIS — G629 Polyneuropathy, unspecified: Secondary | ICD-10-CM

## 2017-11-16 DIAGNOSIS — C3491 Malignant neoplasm of unspecified part of right bronchus or lung: Secondary | ICD-10-CM

## 2017-11-16 DIAGNOSIS — C3481 Malignant neoplasm of overlapping sites of right bronchus and lung: Secondary | ICD-10-CM

## 2017-11-16 DIAGNOSIS — Z5111 Encounter for antineoplastic chemotherapy: Secondary | ICD-10-CM | POA: Diagnosis not present

## 2017-11-16 DIAGNOSIS — T451X5A Adverse effect of antineoplastic and immunosuppressive drugs, initial encounter: Secondary | ICD-10-CM

## 2017-11-16 DIAGNOSIS — Z95828 Presence of other vascular implants and grafts: Secondary | ICD-10-CM

## 2017-11-16 DIAGNOSIS — D630 Anemia in neoplastic disease: Secondary | ICD-10-CM

## 2017-11-16 DIAGNOSIS — C7989 Secondary malignant neoplasm of other specified sites: Secondary | ICD-10-CM | POA: Diagnosis not present

## 2017-11-16 DIAGNOSIS — R112 Nausea with vomiting, unspecified: Secondary | ICD-10-CM | POA: Diagnosis not present

## 2017-11-16 DIAGNOSIS — D701 Agranulocytosis secondary to cancer chemotherapy: Secondary | ICD-10-CM | POA: Diagnosis not present

## 2017-11-16 DIAGNOSIS — D61810 Antineoplastic chemotherapy induced pancytopenia: Secondary | ICD-10-CM

## 2017-11-16 LAB — COMPREHENSIVE METABOLIC PANEL
ALK PHOS: 102 U/L (ref 40–150)
ALT: 19 U/L (ref 0–55)
AST: 27 U/L (ref 5–34)
Albumin: 3.4 g/dL — ABNORMAL LOW (ref 3.5–5.0)
Anion gap: 10 (ref 3–11)
BUN: 7 mg/dL (ref 7–26)
CALCIUM: 9.8 mg/dL (ref 8.4–10.4)
CO2: 24 mmol/L (ref 22–29)
CREATININE: 0.77 mg/dL (ref 0.60–1.10)
Chloride: 105 mmol/L (ref 98–109)
Glucose, Bld: 108 mg/dL (ref 70–140)
Potassium: 3.4 mmol/L — ABNORMAL LOW (ref 3.5–5.1)
Sodium: 139 mmol/L (ref 136–145)
Total Bilirubin: 0.7 mg/dL (ref 0.2–1.2)
Total Protein: 7.6 g/dL (ref 6.4–8.3)

## 2017-11-16 LAB — CBC WITH DIFFERENTIAL/PLATELET
BASOS PCT: 0 %
Basophils Absolute: 0 10*3/uL (ref 0.0–0.1)
EOS ABS: 0 10*3/uL (ref 0.0–0.5)
EOS PCT: 0 %
HCT: 28.3 % — ABNORMAL LOW (ref 34.8–46.6)
HEMOGLOBIN: 9.4 g/dL — AB (ref 11.6–15.9)
Lymphocytes Relative: 18 %
Lymphs Abs: 0.6 10*3/uL — ABNORMAL LOW (ref 0.9–3.3)
MCH: 32.3 pg (ref 25.1–34.0)
MCHC: 33.2 g/dL (ref 31.5–36.0)
MCV: 97.3 fL (ref 79.5–101.0)
MONOS PCT: 11 %
Monocytes Absolute: 0.3 10*3/uL (ref 0.1–0.9)
NEUTROS PCT: 71 %
Neutro Abs: 2.1 10*3/uL (ref 1.5–6.5)
PLATELETS: 138 10*3/uL — AB (ref 145–400)
RBC: 2.91 MIL/uL — AB (ref 3.70–5.45)
RDW: 21.1 % — ABNORMAL HIGH (ref 11.2–14.5)
WBC: 3 10*3/uL — AB (ref 3.9–10.3)

## 2017-11-16 MED ORDER — PALONOSETRON HCL INJECTION 0.25 MG/5ML
INTRAVENOUS | Status: AC
  Filled 2017-11-16: qty 5

## 2017-11-16 MED ORDER — SODIUM CHLORIDE 0.9 % IV SOLN
150.0000 mg/m2 | Freq: Once | INTRAVENOUS | Status: AC
  Administered 2017-11-16: 318 mg via INTRAVENOUS
  Filled 2017-11-16: qty 53

## 2017-11-16 MED ORDER — FOSAPREPITANT DIMEGLUMINE INJECTION 150 MG
Freq: Once | INTRAVENOUS | Status: AC
  Administered 2017-11-16: 13:00:00 via INTRAVENOUS
  Filled 2017-11-16: qty 5

## 2017-11-16 MED ORDER — PALONOSETRON HCL INJECTION 0.25 MG/5ML
0.2500 mg | Freq: Once | INTRAVENOUS | Status: AC
  Administered 2017-11-16: 0.25 mg via INTRAVENOUS

## 2017-11-16 MED ORDER — SODIUM CHLORIDE 0.9% FLUSH
10.0000 mL | INTRAVENOUS | Status: DC | PRN
  Administered 2017-11-16: 10 mL
  Filled 2017-11-16: qty 10

## 2017-11-16 MED ORDER — PEGFILGRASTIM 6 MG/0.6ML ~~LOC~~ PSKT
6.0000 mg | PREFILLED_SYRINGE | Freq: Once | SUBCUTANEOUS | Status: AC
  Administered 2017-11-16: 6 mg via SUBCUTANEOUS

## 2017-11-16 MED ORDER — DIPHENHYDRAMINE HCL 50 MG/ML IJ SOLN
50.0000 mg | Freq: Once | INTRAMUSCULAR | Status: AC
  Administered 2017-11-16: 50 mg via INTRAVENOUS

## 2017-11-16 MED ORDER — FAMOTIDINE IN NACL 20-0.9 MG/50ML-% IV SOLN: 20.0000 mg | Freq: Once | INTRAVENOUS | Status: DC

## 2017-11-16 MED ORDER — SODIUM CHLORIDE 0.9 % IV SOLN
20.0000 mg | Freq: Once | INTRAVENOUS | Status: AC
  Administered 2017-11-16: 20 mg via INTRAVENOUS
  Filled 2017-11-16: qty 2

## 2017-11-16 MED ORDER — HEPARIN SOD (PORK) LOCK FLUSH 100 UNIT/ML IV SOLN
500.0000 [IU] | Freq: Once | INTRAVENOUS | Status: AC | PRN
  Administered 2017-11-16: 500 [IU]
  Filled 2017-11-16: qty 5

## 2017-11-16 MED ORDER — SODIUM CHLORIDE 0.9% FLUSH
10.0000 mL | INTRAVENOUS | Status: DC | PRN
  Administered 2017-11-16: 10 mL via INTRAVENOUS
  Filled 2017-11-16: qty 10

## 2017-11-16 MED ORDER — PEGFILGRASTIM 6 MG/0.6ML ~~LOC~~ PSKT
PREFILLED_SYRINGE | SUBCUTANEOUS | Status: AC
  Filled 2017-11-16: qty 0.6

## 2017-11-16 MED ORDER — SODIUM CHLORIDE 0.9 % IV SOLN
700.0000 mg | Freq: Once | INTRAVENOUS | Status: AC
  Administered 2017-11-16: 700 mg via INTRAVENOUS
  Filled 2017-11-16: qty 70

## 2017-11-16 MED ORDER — SODIUM CHLORIDE 0.9 % IV SOLN
Freq: Once | INTRAVENOUS | Status: AC
  Administered 2017-11-16: 12:00:00 via INTRAVENOUS

## 2017-11-16 MED ORDER — DIPHENHYDRAMINE HCL 50 MG/ML IJ SOLN
INTRAMUSCULAR | Status: AC
  Filled 2017-11-16: qty 1

## 2017-11-16 NOTE — Progress Notes (Signed)
[facility] [phone]   [phone]  OFFICE PROGRESS NOTE  Patient, No Pcp Per No address on file  DIAGNOSIS: Metastatic non-small cell lung cancer initially diagnosed as Stage IIIA (T2b, N2, M0) non-small cell lung cancer, invasive poorly differentiated squamous cell carcinoma diagnosed in November 2016 and presented with large right hilar mass with collapse of the right middle lobe and extension into the right upper lobe and right lower lobe along the hilum with right paratracheal and subcarinal lymphadenopathy. She presented in July 2018 with metastatic intramuscular mass in the right posterior lateral chest wall, with biopsy confirmed squamous cell carcinoma. PDL1 Expression: 50%.  PRIOR THERAPY:  1) Concurrent chemoradiation with weekly carboplatin for AUC of 2 and paclitaxel 45 MG/M2. First dose 08/19/2015. Status post 5 cycles. Last cycle was given 10/07/2015 with significant response. 2) Consolidation chemotherapy with carboplatin for AUC of 5 and paclitaxel 175 MG/M2 every 3 weeks with Neulasta support. First dose 11/18/2015. Status post 3 cycles. 3) palliative radiotherapy to the intramuscular mass in the right posterior lateral chest wall under the care of Dr. Tammi Klippel. 4) Ketruda 200 mg IV every 3 weeks. First dose 05/12/2017. Status post 3 cycles.  This was discontinued secondary to disease progression.  CURRENT THERAPY: Systemic chemotherapy with carboplatin for AC of 5 and paclitaxel 175 mg/M2 every 3 weeks with Neulasta support.  First dose August 26, 2017.  Status post 4 cycles.   INTERVAL HISTORY: Theresa [name] 57 y.o. female returns to the clinic today for follow-up visit.  The patient is feeling much better.  She was admitted to [facility] with the flu.  She was a started on Tamiflu in the hospital.  She is feeling much better.  She denied having any chest pain, shortness of breath, cough or hemoptysis.  She has no  current nausea or vomiting.  The patient denied having any headache or visual changes.  She lost a few pounds since her last visit.  She is here today for evaluation before starting cycle #5 of her treatment.   MEDICAL HISTORY: Past Medical History:  Diagnosis Date  . Cancer ([place])    5 years ago - cervical cancer  . Encounter for antineoplastic chemotherapy 08/27/2015  . History of radiation therapy 08/19/2015 - 10/07/2015   Site/dose:   The patient's primary tumor and involved lymph nodes were treated to 66 Gy in 30 fractions.  . Hypokalemia 09/30/2015  . Non-small cell carcinoma of lung, stage 3 ([place]) 08/08/2015  . Paroxysmal atrial flutter ([place]) 09/16/2015  . Pneumonia     ALLERGIES:  has No Known Allergies.  MEDICATIONS:  Current Outpatient Medications  Medication Sig Dispense Refill  . benzonatate (TESSALON) 100 MG capsule Take 1 capsule (100 mg total) by mouth 2 (two) times daily as needed for cough. 20 capsule 0  . diphenoxylate-atropine (LOMOTIL) 2.5-0.025 MG tablet Take 2 tablets by mouth 4 (four) times daily as needed for diarrhea or loose stools. (Patient not taking: Reported on 09/15/2017) 60 tablet 1  . gabapentin (NEURONTIN) 100 MG capsule Take 2 capsules (200 mg total) by mouth 3 (three) times daily. 180 capsule 1  . ibuprofen (ADVIL,MOTRIN) 800 MG tablet Take 1 tablet by mouth every 8 (eight) hours as needed.    . lactulose (CHRONULAC) 10 GM/15ML solution Take 15 mLs (10 g total) by mouth 3 (three) times daily as needed for mild constipation. (Patient not taking: Reported on 09/02/2017) 240 mL 3  . lidocaine-prilocaine (EMLA)  cream Apply 1 application topically as needed. 30 g 2  . LORazepam (ATIVAN) 0.5 MG tablet 0.5 mg sublingual p.o. every 6 hours as needed for nausea and/or vomiting. (Patient not taking: Reported on 10/15/2017) 40 tablet 0  . metoprolol tartrate (LOPRESSOR) 25 MG tablet Take 0.5 tablets (12.5 mg total) by mouth 2 (two) times daily. 30 tablet 0  .  Multiple Vitamin (MULTIVITAMIN) capsule Take 1 capsule daily by mouth. 30 capsule 2  . ondansetron (ZOFRAN-ODT) 8 MG disintegrating tablet Take 1 tablet (8 mg total) by mouth every 8 (eight) hours as needed for nausea or vomiting. (Patient not taking: Reported on 11/01/2017) 20 tablet 1  . oseltamivir (TAMIFLU) 75 MG capsule Take 1 capsule (75 mg total) by mouth 2 (two) times daily. 5 capsule 0  . oxyCODONE (OXY IR/ROXICODONE) 5 MG immediate release tablet Take 1 tablet (5 mg total) by mouth every 6 (six) hours as needed for severe pain. 60 tablet 0  . potassium chloride SA (K-DUR,KLOR-CON) 20 MEQ tablet Take 2 tablets twice a day for 5 days then 1 tablet daily. 40 tablet 0  . prochlorperazine (COMPAZINE) 10 MG tablet Take 1 tablet (10 mg total) by mouth every 6 (six) hours as needed for nausea or vomiting. 30 tablet 1   No current facility-administered medications for this visit.    Facility-Administered Medications Ordered in Other Visits  Medication Dose Route Frequency Provider Last Rate Last Dose  . 0.9 %  sodium chloride infusion   Intravenous Continuous Curcio, Kristin R, NP      . 0.9 %  sodium chloride infusion   Intravenous Continuous Curt Bears, MD   Stopped at 05/31/17 1714  . ondansetron (ZOFRAN) injection 8 mg  8 mg Intravenous Once Curt Bears, MD        SURGICAL HISTORY:  Past Surgical History:  Procedure Laterality Date  . IR FLUORO GUIDE PORT INSERTION RIGHT  05/21/2017  . IR US GUIDE VASC ACCESS RIGHT  05/21/2017  . TUBAL LIGATION    . VIDEO BRONCHOSCOPY WITH ENDOBRONCHIAL ULTRASOUND N/A 08/02/2015   Procedure: VIDEO BRONCHOSCOPY WITH ENDOBRONCHIAL ULTRASOUND;  Surgeon: [name], MD;  Location: [facility];  Service: Thoracic;  Laterality: N/A;  . WISDOM TOOTH EXTRACTION      REVIEW OF SYSTEMS:  A comprehensive review of systems was negative.   PHYSICAL EXAMINATION: General appearance: alert, cooperative and no distress Head: Normocephalic, without  obvious abnormality, atraumatic Neck: no adenopathy, no JVD, supple, symmetrical, trachea midline and thyroid not enlarged, symmetric, no tenderness/mass/nodules Lymph nodes: Cervical, supraclavicular, and axillary nodes normal. Resp: clear to auscultation bilaterally Back: symmetric, no curvature. ROM normal. No CVA tenderness. Cardio: regular rate and rhythm, S1, S2 normal, no murmur, click, rub or gallop GI: soft, non-tender; bowel sounds normal; no masses,  no organomegaly Extremities: extremities normal, atraumatic, no cyanosis or edema Neurologic: Alert and oriented X 3, normal strength and tone. Normal symmetric reflexes. Normal coordination and gait  Examination of the back showed a palpable mass above the right scapula.  ECOG PERFORMANCE STATUS: 1 - Symptomatic but completely ambulatory  Blood pressure 133/87, pulse (!) 115, temperature 98.6 F (37 C), temperature source Oral, resp. rate 19, height '5\' 8"'  (1.727 m), weight 201 lb 3.2 oz (91.3 kg), SpO2 97 %.  LABORATORY DATA: Lab Results  Component Value Date   WBC 3.0 (L) 11/16/2017   HGB 9.4 (L) 11/16/2017   HCT 28.3 (L) 11/16/2017   MCV 97.3 11/16/2017   PLT 138 (L) 11/16/2017  Chemistry      Component Value Date/Time   NA 139 11/09/2017 1115   NA 138 09/24/2017 1600   K 3.5 11/09/2017 1115   K 3.4 (L) 09/24/2017 1600   CL 104 11/09/2017 1115   CO2 26 11/09/2017 1115   CO2 25 09/24/2017 1600   BUN 8 11/09/2017 1115   BUN 9.5 09/24/2017 1600   CREATININE 0.81 11/09/2017 1115   CREATININE 0.82 11/01/2017 1613   CREATININE 0.8 09/24/2017 1600      Component Value Date/Time   CALCIUM 9.5 11/09/2017 1115   CALCIUM 9.0 09/24/2017 1600   ALKPHOS 103 11/09/2017 1115   ALKPHOS 92 09/24/2017 1600   AST 26 11/09/2017 1115   AST 41 (H) 11/01/2017 1613   AST 31 09/24/2017 1600   ALT 20 11/09/2017 1115   ALT 15 11/01/2017 1613   ALT 24 09/24/2017 1600   BILITOT 0.5 11/09/2017 1115   BILITOT 1.0 11/01/2017 1613     BILITOT 0.40 09/24/2017 1600       RADIOGRAPHIC STUDIES: Dg Chest 1 View  Result Date: 11/05/2017 CLINICAL DATA:  Status post right thoracentesis. EXAM: CHEST 1 VIEW COMPARISON:  11/01/2017 FINDINGS: There is a right chest wall port a catheter with tip in the projection of the cavoatrial junction. Decrease in volume of the right pleural effusion status post thoracentesis. No pneumothorax identified. The left lung is clear. IMPRESSION: 1. Decreased volume of the right pleural effusion status post thoracentesis. No pneumothorax. Electronically Signed   By: Kerby Moors M.D.   On: 11/05/2017 12:38   Dg Chest 2 View  Result Date: 11/01/2017 CLINICAL DATA:  Acute onset of cough. Patient on chemotherapy for lung cancer. EXAM: CHEST  2 VIEW COMPARISON:  Chest radiograph performed 08/03/2017, and CT of the chest performed 10/25/2017 FINDINGS: There is a persistent moderate to large right-sided pleural effusion, reflecting the underlying malignancy. The left lung appears relatively clear. No pneumothorax is seen. The heart is normal in size, though difficult to fully assess given the pleural effusion. A right-sided chest port is noted ending about the mid SVC. No acute osseous abnormalities are seen. IMPRESSION: Persistent moderate to large right-sided pleural effusion, reflecting underlying malignancy. Electronically Signed   By: Garald Balding M.D.   On: 11/01/2017 22:09   Ct Chest W Contrast  Result Date: 10/25/2017 CLINICAL DATA:  Non-small cell lung cancer, restaging, ongoing chemotherapy. EXAM: CT CHEST, ABDOMEN, AND PELVIS WITH CONTRAST TECHNIQUE: Multidetector CT imaging of the chest, abdomen and pelvis was performed following the standard protocol during bolus administration of intravenous contrast. CONTRAST:  100 cc Isovue-300. COMPARISON:  PET 05/05/2017 and CT chest 04/19/2017. CT abdomen pelvis 03/11/2017. FINDINGS: CT CHEST FINDINGS Cardiovascular: Right IJ Port-A-Cath terminates in the  right atrium. Aberrant right subclavian artery. Heart size normal. No pericardial effusion. Mediastinum/Nodes: No pathologically enlarged mediastinal, hilar or axillary lymph nodes. Esophagus is grossly unremarkable. Lungs/Pleura: Large right pleural effusion, increased from 05/05/2017, with post radiation changes in the medial right hemithorax. New hyperattenuating extrapleural thickening in the posterior right hemithorax, at the level of the right ninth and tenth ribs. Patchy peribronchovascular consolidation and ground-glass in the medial left lower lobe, new. No left pleural fluid. Airway is otherwise unremarkable. Musculoskeletal: Slight lytic destruction of the posterior right ninth rib, new. Large posterior right chest wall mass measures 4.8 x 9.7 cm, grossly stable from 05/05/2017. A 2.0 x 3.0 cm soft tissue mass lateral to the inferior right scapula (series 2, image 23) appears new from 04/19/2017. Mildly  hyperattenuating soft tissue nodules are seen posterior to the right ninth and tenth intercostal space, with hyperattenuating soft tissue extending into the intercostal region and likely extrapleural space, new or better visualized on the current exam. CT ABDOMEN PELVIS FINDINGS Hepatobiliary: Low-attenuation lesions in the liver measure up to 10 mm in the dome, as before. Liver and gallbladder are otherwise unremarkable. No biliary ductal dilatation. Pancreas: Negative. Spleen: Negative. Adrenals/Urinary Tract: Adrenal glands and kidneys are unremarkable. Ureters are decompressed. Bladder is grossly unremarkable. Stomach/Bowel: Stomach is decompressed. Small bowel and appendix are unremarkable. Stool is seen in the majority of the colon, indicative of constipation. Colon is otherwise unremarkable. Vascular/Lymphatic: Atherosclerotic calcification of the arterial vasculature without abdominal aortic aneurysm. No pathologically enlarged lymph nodes. Reproductive: Uterus is visualized. Fat containing lesion  in the right adnexa measures up to 5.8 cm, slightly enlarged from 4.9 cm on 03/11/2017. Other: No free fluid.  Mesenteries and peritoneum are unremarkable. Musculoskeletal: No additional worrisome lytic or sclerotic lesions. IMPRESSION: 1. Progressive right posterior chest wall metastatic disease with probable infiltration of the right 9/10 intercostal space, into the posterior extrapleural space of the right hemithorax. 2. New lytic destruction of the posterior right ninth rib. 3. Large right pleural effusion, markedly increased from prior. 4. New peribronchovascular ground-glass and consolidation in the left lower lobe, likely infectious or inflammatory in etiology. 5.  Aortic atherosclerosis (ICD10-170.0). 6. Fat containing lesion in the right adnexa measures slightly larger than on 03/11/2017 and may represent an ovarian dermoid. Electronically Signed   By: Lorin Picket M.D.   On: 10/25/2017 12:57   Ct Abdomen Pelvis W Contrast  Result Date: 10/25/2017 CLINICAL DATA:  Non-small cell lung cancer, restaging, ongoing chemotherapy. EXAM: CT CHEST, ABDOMEN, AND PELVIS WITH CONTRAST TECHNIQUE: Multidetector CT imaging of the chest, abdomen and pelvis was performed following the standard protocol during bolus administration of intravenous contrast. CONTRAST:  100 cc Isovue-300. COMPARISON:  PET 05/05/2017 and CT chest 04/19/2017. CT abdomen pelvis 03/11/2017. FINDINGS: CT CHEST FINDINGS Cardiovascular: Right IJ Port-A-Cath terminates in the right atrium. Aberrant right subclavian artery. Heart size normal. No pericardial effusion. Mediastinum/Nodes: No pathologically enlarged mediastinal, hilar or axillary lymph nodes. Esophagus is grossly unremarkable. Lungs/Pleura: Large right pleural effusion, increased from 05/05/2017, with post radiation changes in the medial right hemithorax. New hyperattenuating extrapleural thickening in the posterior right hemithorax, at the level of the right ninth and tenth ribs.  Patchy peribronchovascular consolidation and ground-glass in the medial left lower lobe, new. No left pleural fluid. Airway is otherwise unremarkable. Musculoskeletal: Slight lytic destruction of the posterior right ninth rib, new. Large posterior right chest wall mass measures 4.8 x 9.7 cm, grossly stable from 05/05/2017. A 2.0 x 3.0 cm soft tissue mass lateral to the inferior right scapula (series 2, image 23) appears new from 04/19/2017. Mildly hyperattenuating soft tissue nodules are seen posterior to the right ninth and tenth intercostal space, with hyperattenuating soft tissue extending into the intercostal region and likely extrapleural space, new or better visualized on the current exam. CT ABDOMEN PELVIS FINDINGS Hepatobiliary: Low-attenuation lesions in the liver measure up to 10 mm in the dome, as before. Liver and gallbladder are otherwise unremarkable. No biliary ductal dilatation. Pancreas: Negative. Spleen: Negative. Adrenals/Urinary Tract: Adrenal glands and kidneys are unremarkable. Ureters are decompressed. Bladder is grossly unremarkable. Stomach/Bowel: Stomach is decompressed. Small bowel and appendix are unremarkable. Stool is seen in the majority of the colon, indicative of constipation. Colon is otherwise unremarkable. Vascular/Lymphatic: Atherosclerotic calcification of the arterial vasculature  without abdominal aortic aneurysm. No pathologically enlarged lymph nodes. Reproductive: Uterus is visualized. Fat containing lesion in the right adnexa measures up to 5.8 cm, slightly enlarged from 4.9 cm on 03/11/2017. Other: No free fluid.  Mesenteries and peritoneum are unremarkable. Musculoskeletal: No additional worrisome lytic or sclerotic lesions. IMPRESSION: 1. Progressive right posterior chest wall metastatic disease with probable infiltration of the right 9/10 intercostal space, into the posterior extrapleural space of the right hemithorax. 2. New lytic destruction of the posterior right  ninth rib. 3. Large right pleural effusion, markedly increased from prior. 4. New peribronchovascular ground-glass and consolidation in the left lower lobe, likely infectious or inflammatory in etiology. 5.  Aortic atherosclerosis (ICD10-170.0). 6. Fat containing lesion in the right adnexa measures slightly larger than on 03/11/2017 and may represent an ovarian dermoid. Electronically Signed   By: Lorin Picket M.D.   On: 10/25/2017 12:57   US Thoracentesis Asp Pleural Space W/img Guide  Result Date: 11/05/2017 INDICATION: Metastatic non-small cell lung cancer, recurrent right pleural effusion. Request made for diagnostic and therapeutic right thoracentesis. EXAM: ULTRASOUND GUIDED DIAGNOSTIC AND THERAPEUTIC RIGHT THORACENTESIS MEDICATIONS: None. COMPLICATIONS: None immediate. PROCEDURE: An ultrasound guided thoracentesis was thoroughly discussed with the patient and questions answered. The benefits, risks, alternatives and complications were also discussed. The patient understands and wishes to proceed with the procedure. Written consent was obtained. Ultrasound was performed to localize and mark an adequate pocket of fluid in the right chest. The area was then prepped and draped in the normal sterile fashion. 1% Lidocaine was used for local anesthesia. Under ultrasound guidance a 6 Fr Safe-T-Centesis catheter was introduced. Thoracentesis was performed. The catheter was removed and a dressing applied. FINDINGS: A total of approximately 1.5 liters of yellow fluid was removed. Samples were sent to the laboratory as requested by the clinical team.Only the above amount of fluid was removed today secondary to pt coughing/chest discomfort. IMPRESSION: Successful ultrasound guided diagnostic and therapeutic right thoracentesis yielding 1.5 liters of pleural fluid. Follow up chest xray revealed no pneumothorax. Read by: Rowe Robert, PA-C Electronically Signed   By: Corrie Mckusick D.O.   On: 11/05/2017 13:21     ASSESSMENT AND PLAN:  This is a very pleasant 57 years old African-American female with metastatic non-small cell lung cancer that was initially diagnosed as stage IIIa non-small cell lung cancer, squamous cell carcinoma presented with large right hilar mass and mediastinal lymphadenopathy diagnosed in November 2016. She status post concurrent chemoradiation followed by consolidation chemotherapy. The patient was found recently to have intramuscular metastases and the right posterior chest wall that was biopsy-proven to be squamous cell carcinoma with PDL 1 expression 50%. The patient was a started on treatment with immunotherapy with Ketruda (pembrolizumab) status post 3 cycles. Unfortunately imaging studies after the treatment with Keytruda showed evidence for disease progression. The patient is currently undergoing treatment with carboplatin for [facility] of 5 and paclitaxel 175 mg/M2 status post 4 cycles.  She continues to tolerate this treatment very well especially after reducing the dose of paclitaxel to 150 mg/M2 starting from cycle #2. She continues to tolerate this treatment well with no concerning complaints. I recommended for her to proceed with cycle #5 today as a scheduled. She will come back for follow-up visit in 3 weeks for evaluation before starting cycle #6. For the peripheral neuropathy, I will start the patient on Neurontin 100 mg p.o. 3 times daily. The patient was advised to call immediately if she has any concerning symptoms in the  interval. The patient voices understanding of current disease status and treatment options and is in agreement with the current care plan. All questions were answered. The patient knows to call the clinic with any problems, questions or concerns. We can certainly see the patient much sooner if necessary.  Disclaimer: This note was dictated with voice recognition software. Similar sounding words can inadvertently be transcribed and may not be corrected  upon review.

## 2017-11-16 NOTE — Telephone Encounter (Signed)
Scheduled appt per 2/26 los - patient to get an updated schedule in the tx area.

## 2017-11-16 NOTE — Patient Instructions (Signed)
   [facility] Discharge Instructions for Patients Receiving Chemotherapy  Today you received the following chemotherapy agents Taxol and Carboplatin   To help prevent nausea and vomiting after your treatment, we encourage you to take your nausea medication as directed.    If you develop nausea and vomiting that is not controlled by your nausea medication, call the clinic.   BELOW ARE SYMPTOMS THAT SHOULD BE REPORTED IMMEDIATELY:  *FEVER GREATER THAN 100.5 F  *CHILLS WITH OR WITHOUT FEVER  NAUSEA AND VOMITING THAT IS NOT CONTROLLED WITH YOUR NAUSEA MEDICATION  *UNUSUAL SHORTNESS OF BREATH  *UNUSUAL BRUISING OR BLEEDING  TENDERNESS IN MOUTH AND THROAT WITH OR WITHOUT PRESENCE OF ULCERS  *URINARY PROBLEMS  *BOWEL PROBLEMS  UNUSUAL RASH Items with * indicate a potential emergency and should be followed up as soon as possible.  Feel free to call the clinic should you have any questions or concerns. The clinic phone number is [phone].  Please show the CHEMO ALERT CARD at check-in to the Emergency Department and triage nurse.   

## 2017-11-23 ENCOUNTER — Inpatient Hospital Stay: Payer: Medicare Other

## 2017-11-23 ENCOUNTER — Inpatient Hospital Stay: Payer: Medicare Other | Attending: Internal Medicine

## 2017-11-23 DIAGNOSIS — E876 Hypokalemia: Secondary | ICD-10-CM | POA: Insufficient documentation

## 2017-11-23 DIAGNOSIS — G629 Polyneuropathy, unspecified: Secondary | ICD-10-CM | POA: Diagnosis not present

## 2017-11-23 DIAGNOSIS — Z5189 Encounter for other specified aftercare: Secondary | ICD-10-CM | POA: Diagnosis not present

## 2017-11-23 DIAGNOSIS — Z452 Encounter for adjustment and management of vascular access device: Secondary | ICD-10-CM | POA: Diagnosis not present

## 2017-11-23 DIAGNOSIS — Z5111 Encounter for antineoplastic chemotherapy: Secondary | ICD-10-CM | POA: Diagnosis not present

## 2017-11-23 DIAGNOSIS — E860 Dehydration: Secondary | ICD-10-CM | POA: Diagnosis not present

## 2017-11-23 DIAGNOSIS — R112 Nausea with vomiting, unspecified: Secondary | ICD-10-CM | POA: Insufficient documentation

## 2017-11-23 DIAGNOSIS — C3481 Malignant neoplasm of overlapping sites of right bronchus and lung: Secondary | ICD-10-CM | POA: Diagnosis not present

## 2017-11-23 DIAGNOSIS — C3491 Malignant neoplasm of unspecified part of right bronchus or lung: Secondary | ICD-10-CM

## 2017-11-23 LAB — CBC WITH DIFFERENTIAL/PLATELET
BASOS ABS: 0 10*3/uL (ref 0.0–0.1)
BASOS PCT: 1 %
EOS ABS: 0 10*3/uL (ref 0.0–0.5)
Eosinophils Relative: 1 %
HCT: 26.1 % — ABNORMAL LOW (ref 34.8–46.6)
HEMOGLOBIN: 8.9 g/dL — AB (ref 11.6–15.9)
Lymphocytes Relative: 48 %
Lymphs Abs: 0.5 10*3/uL — ABNORMAL LOW (ref 0.9–3.3)
MCH: 33 pg (ref 25.1–34.0)
MCHC: 34.1 g/dL (ref 31.5–36.0)
MCV: 96.7 fL (ref 79.5–101.0)
MONOS PCT: 6 %
Monocytes Absolute: 0.1 10*3/uL (ref 0.1–0.9)
NEUTROS PCT: 44 %
Neutro Abs: 0.5 10*3/uL — CL (ref 1.5–6.5)
Platelets: 57 10*3/uL — ABNORMAL LOW (ref 145–400)
RBC: 2.7 MIL/uL — ABNORMAL LOW (ref 3.70–5.45)
RDW: 19.6 % — ABNORMAL HIGH (ref 11.2–14.5)
WBC: 1 10*3/uL — AB (ref 3.9–10.3)

## 2017-11-23 LAB — COMPREHENSIVE METABOLIC PANEL
ALK PHOS: 105 U/L (ref 40–150)
ALT: 28 U/L (ref 0–55)
AST: 41 U/L — ABNORMAL HIGH (ref 5–34)
Albumin: 3.3 g/dL — ABNORMAL LOW (ref 3.5–5.0)
Anion gap: 12 — ABNORMAL HIGH (ref 3–11)
BILIRUBIN TOTAL: 0.9 mg/dL (ref 0.2–1.2)
BUN: 11 mg/dL (ref 7–26)
CALCIUM: 9.5 mg/dL (ref 8.4–10.4)
CO2: 24 mmol/L (ref 22–29)
CREATININE: 0.79 mg/dL (ref 0.60–1.10)
Chloride: 100 mmol/L (ref 98–109)
GFR calc Af Amer: >60 mL/min (ref >60)
Glucose, Bld: 126 mg/dL (ref 70–140)
Potassium: 3.3 mmol/L — ABNORMAL LOW (ref 3.5–5.1)
Sodium: 136 mmol/L (ref 136–145)
TOTAL PROTEIN: 7.4 g/dL (ref 6.4–8.3)

## 2017-11-23 MED ORDER — HEPARIN SOD (PORK) LOCK FLUSH 100 UNIT/ML IV SOLN
500.0000 [IU] | Freq: Once | INTRAVENOUS | Status: AC | PRN
  Administered 2017-11-23: 500 [IU]
  Filled 2017-11-23: qty 5

## 2017-11-23 MED ORDER — SODIUM CHLORIDE 0.9% FLUSH
10.0000 mL | INTRAVENOUS | Status: DC | PRN
  Administered 2017-11-23: 10 mL
  Filled 2017-11-23: qty 10

## 2017-11-25 ENCOUNTER — Other Ambulatory Visit: Payer: Self-pay | Admitting: Medical Oncology

## 2017-11-25 ENCOUNTER — Telehealth: Payer: Self-pay | Admitting: Medical Oncology

## 2017-11-25 DIAGNOSIS — C3491 Malignant neoplasm of unspecified part of right bronchus or lung: Secondary | ICD-10-CM

## 2017-11-25 DIAGNOSIS — R112 Nausea with vomiting, unspecified: Secondary | ICD-10-CM

## 2017-11-25 MED ORDER — OXYCODONE HCL 5 MG PO TABS
5.0000 mg | ORAL_TABLET | Freq: Four times a day (QID) | ORAL | 0 refills | Status: DC | PRN
Start: 2017-11-25 — End: 2017-11-30

## 2017-11-25 MED ORDER — ONDANSETRON 8 MG PO TBDP: 8.0000 mg | ORAL_TABLET | Freq: Three times a day (TID) | ORAL | 1 refills | Status: DC | PRN

## 2017-11-25 MED ORDER — OXYCODONE HCL 5 MG PO TABS
5.0000 mg | ORAL_TABLET | Freq: Four times a day (QID) | ORAL | 0 refills | Status: DC | PRN
Start: 2017-11-10 — End: 2017-11-25

## 2017-11-25 NOTE — Addendum Note (Signed)
Addended by: Ardeen Garland on: 11/25/2017 04:36 PM   Modules accepted: Orders

## 2017-11-25 NOTE — Telephone Encounter (Signed)
I left VM -1.What antiemetics does she need ? 2. For cough - Does she need tesslon caps refilled?

## 2017-11-26 ENCOUNTER — Inpatient Hospital Stay: Payer: Medicare Other

## 2017-11-26 ENCOUNTER — Inpatient Hospital Stay (HOSPITAL_BASED_OUTPATIENT_CLINIC_OR_DEPARTMENT_OTHER): Payer: Medicare Other | Admitting: Medical

## 2017-11-26 ENCOUNTER — Other Ambulatory Visit: Payer: Self-pay | Admitting: *Deleted

## 2017-11-26 ENCOUNTER — Telehealth: Payer: Self-pay | Admitting: *Deleted

## 2017-11-26 VITALS — BP 115/69 | HR 117 | Temp 98.9°F | Resp 24 | Ht 68.0 in

## 2017-11-26 DIAGNOSIS — E860 Dehydration: Secondary | ICD-10-CM

## 2017-11-26 DIAGNOSIS — C3481 Malignant neoplasm of overlapping sites of right bronchus and lung: Secondary | ICD-10-CM | POA: Diagnosis not present

## 2017-11-26 DIAGNOSIS — E876 Hypokalemia: Secondary | ICD-10-CM

## 2017-11-26 DIAGNOSIS — Z5189 Encounter for other specified aftercare: Secondary | ICD-10-CM | POA: Diagnosis not present

## 2017-11-26 DIAGNOSIS — R112 Nausea with vomiting, unspecified: Secondary | ICD-10-CM | POA: Diagnosis not present

## 2017-11-26 DIAGNOSIS — Z5111 Encounter for antineoplastic chemotherapy: Secondary | ICD-10-CM | POA: Diagnosis not present

## 2017-11-26 LAB — CBC WITH DIFFERENTIAL (CANCER CENTER ONLY)
Basophils Absolute: 0 10*3/uL (ref 0.0–0.1)
Basophils Relative: 0 %
EOS PCT: 0 %
Eosinophils Absolute: 0 10*3/uL (ref 0.0–0.5)
HCT: 23.8 % — ABNORMAL LOW (ref 34.8–46.6)
Hemoglobin: 8.1 g/dL — ABNORMAL LOW (ref 11.6–15.9)
LYMPHS ABS: 0.5 10*3/uL — AB (ref 0.9–3.3)
Lymphocytes Relative: 12 %
MCH: 32.8 pg (ref 25.1–34.0)
MCHC: 34 g/dL (ref 31.5–36.0)
MCV: 96.4 fL (ref 79.5–101.0)
MONOS PCT: 14 %
Monocytes Absolute: 0.6 10*3/uL (ref 0.1–0.9)
NEUTROS PCT: 74 %
NRBC: 1 /100{WBCs} — AB
Neutro Abs: 2.9 10*3/uL (ref 1.5–6.5)
PLATELETS: 69 10*3/uL — AB (ref 145–400)
RBC: 2.47 MIL/uL — ABNORMAL LOW (ref 3.70–5.45)
RDW: 19.5 % — ABNORMAL HIGH (ref 11.2–14.5)
WBC Count: 4 10*3/uL (ref 3.9–10.3)

## 2017-11-26 LAB — CMP (CANCER CENTER ONLY)
ALBUMIN: 3.4 g/dL — AB (ref 3.5–5.0)
ALT: 25 U/L (ref 0–55)
AST: 31 U/L (ref 5–34)
Alkaline Phosphatase: 117 U/L (ref 40–150)
Anion gap: 12 — ABNORMAL HIGH (ref 3–11)
BUN: 8 mg/dL (ref 7–26)
CHLORIDE: 102 mmol/L (ref 98–109)
CO2: 25 mmol/L (ref 22–29)
Calcium: 9.5 mg/dL (ref 8.4–10.4)
Creatinine: 0.75 mg/dL (ref 0.60–1.10)
GFR, Est AFR Am: >60 mL/min (ref >60)
GFR, Estimated: >60 mL/min (ref >60)
GLUCOSE: 115 mg/dL (ref 70–140)
Potassium: 2.9 mmol/L — CL (ref 3.5–5.1)
SODIUM: 139 mmol/L (ref 136–145)
Total Bilirubin: 0.7 mg/dL (ref 0.2–1.2)
Total Protein: 7.4 g/dL (ref 6.4–8.3)

## 2017-11-26 MED ORDER — SODIUM CHLORIDE 0.9% FLUSH
10.0000 mL | INTRAVENOUS | Status: DC | PRN
  Administered 2017-11-26: 10 mL
  Filled 2017-11-26: qty 10

## 2017-11-26 MED ORDER — DEXAMETHASONE SODIUM PHOSPHATE 10 MG/ML IJ SOLN
INTRAMUSCULAR | Status: AC
  Filled 2017-11-26: qty 1

## 2017-11-26 MED ORDER — SODIUM CHLORIDE 0.9 % IV SOLN
INTRAVENOUS | Status: DC
  Administered 2017-11-26: 15:00:00 via INTRAVENOUS

## 2017-11-26 MED ORDER — SODIUM CHLORIDE 0.9 % IV SOLN
Freq: Once | INTRAVENOUS | Status: AC
  Administered 2017-11-26: 16:00:00 via INTRAVENOUS
  Filled 2017-11-26: qty 250

## 2017-11-26 MED ORDER — POTASSIUM CHLORIDE CRYS ER 20 MEQ PO TBCR: EXTENDED_RELEASE_TABLET | ORAL | 0 refills | Status: DC

## 2017-11-26 MED ORDER — ONDANSETRON HCL 4 MG/2ML IJ SOLN
INTRAMUSCULAR | Status: AC
  Filled 2017-11-26: qty 4

## 2017-11-26 MED ORDER — ONDANSETRON HCL 4 MG/2ML IJ SOLN
8.0000 mg | Freq: Once | INTRAMUSCULAR | Status: AC
  Administered 2017-11-26: 8 mg via INTRAVENOUS

## 2017-11-26 MED ORDER — DEXAMETHASONE SODIUM PHOSPHATE 10 MG/ML IJ SOLN
10.0000 mg | Freq: Once | INTRAMUSCULAR | Status: AC
Start: 2017-11-26 — End: 2017-11-26
  Administered 2017-11-26: 10 mg via INTRAVENOUS

## 2017-11-26 MED ORDER — PROCHLORPERAZINE MALEATE 10 MG PO TABS: 10.0000 mg | ORAL_TABLET | Freq: Four times a day (QID) | ORAL | 1 refills | Status: DC | PRN

## 2017-11-26 MED ORDER — DEXTROSE 5 % IV SOLN: 20.0000 meq | Freq: Once | INTRAVENOUS | Status: DC

## 2017-11-26 MED ORDER — HEPARIN SOD (PORK) LOCK FLUSH 100 UNIT/ML IV SOLN
500.0000 [IU] | Freq: Once | INTRAVENOUS | Status: AC | PRN
  Administered 2017-11-26: 500 [IU]
  Filled 2017-11-26: qty 5

## 2017-11-26 MED ORDER — SODIUM CHLORIDE 0.9 % IV SOLN: Freq: Once | INTRAVENOUS | Status: DC

## 2017-11-26 NOTE — Progress Notes (Signed)
Pt arrived to [facility] in w/c, very nauseated and vomiting small amounts stomach contents.  Pt states she did not take any antiemetics as she had run out of them and has not picked up refills at her pharmacy. She has been nauseated for at least 2 days. She feels dehydrated. Stressed the importance of keeping meds refilled and taking appropriately for nausea. Refill sent in for compazine. Zofran was called in yesterday. Both are at [place] @ [address]

## 2017-11-26 NOTE — Patient Instructions (Signed)

## 2017-11-26 NOTE — Patient Instructions (Addendum)
Dehydration, Adult Dehydration is a condition in which there is not enough fluid or water in the body. This happens when you lose more fluids than you take in. Important organs, such as the kidneys, brain, and heart, cannot function without a proper amount of fluids. Any loss of fluids from the body can lead to dehydration. Dehydration can range from mild to severe. This condition should be treated right away to prevent it from becoming severe. What are the causes? This condition may be caused by:  Vomiting.  Diarrhea.  Excessive sweating, such as from heat exposure or exercise.  Not drinking enough fluid, especially: ? When ill. ? While doing activity that requires a lot of energy.  Excessive urination.  Fever.  Infection.  Certain medicines, such as medicines that cause the body to lose excess fluid (diuretics).  Inability to access safe drinking water.  Reduced physical ability to get adequate water and food.  What increases the risk? This condition is more likely to develop in people:  Who have a poorly controlled long-term (chronic) illness, such as diabetes, heart disease, or kidney disease.  Who are age 65 or older.  Who are disabled.  Who live in a place with high altitude.  Who play endurance sports.  What are the signs or symptoms? Symptoms of mild dehydration may include:  Thirst.  Dry lips.  Slightly dry mouth.  Dry, warm skin.  Dizziness. Symptoms of moderate dehydration may include:  Very dry mouth.  Muscle cramps.  Dark urine. Urine may be the color of tea.  Decreased urine production.  Decreased tear production.  Heartbeat that is irregular or faster than normal (palpitations).  Headache.  Light-headedness, especially when you stand up from a sitting position.  Fainting (syncope). Symptoms of severe dehydration may include:  Changes in skin, such as: ? Cold and clammy skin. ? Blotchy (mottled) or pale skin. ? Skin that does  not quickly return to normal after being lightly pinched and released (poor skin turgor).  Changes in body fluids, such as: ? Extreme thirst. ? No tear production. ? Inability to sweat when body temperature is high, such as in hot weather. ? Very little urine production.  Changes in vital signs, such as: ? Weak pulse. ? Pulse that is more than 100 beats a minute when sitting still. ? Rapid breathing. ? Low blood pressure.  Other changes, such as: ? Sunken eyes. ? Cold hands and feet. ? Confusion. ? Lack of energy (lethargy). ? Difficulty waking up from sleep. ? Short-term weight loss. ? Unconsciousness. How is this diagnosed? This condition is diagnosed based on your symptoms and a physical exam. Blood and urine tests may be done to help confirm the diagnosis. How is this treated? Treatment for this condition depends on the severity. Mild or moderate dehydration can often be treated at home. Treatment should be started right away. Do not wait until dehydration becomes severe. Severe dehydration is an emergency and it needs to be treated in a hospital. Treatment for mild dehydration may include:  Drinking more fluids.  Replacing salts and minerals in your blood (electrolytes) that you may have lost. Treatment for moderate dehydration may include:  Drinking an oral rehydration solution (ORS). This is a drink that helps you replace fluids and electrolytes (rehydrate). It can be found at pharmacies and retail stores. Treatment for severe dehydration may include:  Receiving fluids through an IV tube.  Receiving an electrolyte solution through a feeding tube that is passed through your nose   and into your stomach (nasogastric tube, or NG tube).  Correcting any abnormalities in electrolytes.  Treating the underlying cause of dehydration. Follow these instructions at home:  If directed by your health care provider, drink an ORS: ? Make an ORS by following instructions on the  package. ? Start by drinking small amounts, about  cup (120 mL) every 5-10 minutes. ? Slowly increase how much you drink until you have taken the amount recommended by your health care provider.  Drink enough clear fluid to keep your urine clear or pale yellow. If you were told to drink an ORS, finish the ORS first, then start slowly drinking other clear fluids. Drink fluids such as: ? Water. Do not drink only water. Doing that can lead to having too little salt (sodium) in the body (hyponatremia). ? Ice chips. ? Fruit juice that you have added water to (diluted fruit juice). ? Low-calorie sports drinks.  Avoid: ? Alcohol. ? Drinks that contain a lot of sugar. These include high-calorie sports drinks, fruit juice that is not diluted, and soda. ? Caffeine. ? Foods that are greasy or contain a lot of fat or sugar.  Take over-the-counter and prescription medicines only as told by your health care provider.  Do not take sodium tablets. This can lead to having too much sodium in the body (hypernatremia).  Eat foods that contain a healthy balance of electrolytes, such as bananas, oranges, potatoes, tomatoes, and spinach.  Keep all follow-up visits as told by your health care provider. This is important. Contact a health care provider if:  You have abdominal pain that: ? Gets worse. ? Stays in one area (localizes).  You have a rash.  You have a stiff neck.  You are more irritable than usual.  You are sleepier or more difficult to wake up than usual.  You feel weak or dizzy.  You feel very thirsty.  You have urinated only a small amount of very dark urine over 6-8 hours. Get help right away if:  You have symptoms of severe dehydration.  You cannot drink fluids without vomiting.  Your symptoms get worse with treatment.  You have a fever.  You have a severe headache.  You have vomiting or diarrhea that: ? Gets worse. ? Does not go away.  You have blood or green matter  (bile) in your vomit.  You have blood in your stool. This may cause stool to look black and tarry.  You have not urinated in 6-8 hours.  You faint.  Your heart rate while sitting still is over 100 beats a minute.  You have trouble breathing. This information is not intended to replace advice given to you by your health care provider. Make sure you discuss any questions you have with your health care provider. Document Released: 09/07/2005 Document Revised: 04/03/2016 Document Reviewed: 11/01/2015 Elsevier Interactive Patient Education  2018 [employer].     Hypokalemia Hypokalemia means that the amount of potassium in the blood is lower than normal.Potassium is a chemical that helps regulate the amount of fluid in the body (electrolyte). It also stimulates muscle tightening (contraction) and helps nerves work properly.Normally, most of the body's potassium is inside of cells, and only a very small amount is in the blood. Because the amount in the blood is so small, minor changes to potassium levels in the blood can be life-threatening. What are the causes? This condition may be caused by:  Antibiotic medicine.  Diarrhea or vomiting. Taking too much of a  medicine that helps you have a bowel movement (laxative) can cause diarrhea and lead to hypokalemia.  Chronic kidney disease (CKD).  Medicines that help the body get rid of excess fluid (diuretics).  Eating disorders, such as bulimia.  Low magnesium levels in the body.  Sweating a lot.  What are the signs or symptoms? Symptoms of this condition include:  Weakness.  Constipation.  Fatigue.  Muscle cramps.  Mental confusion.  Skipped heartbeats or irregular heartbeat (palpitations).  Tingling or numbness.  How is this diagnosed? This condition is diagnosed with a blood test. How is this treated? Hypokalemia can be treated by taking potassium supplements by mouth or adjusting the medicines that you take.  Treatment may also include eating more foods that contain a lot of potassium. If your potassium level is very low, you may need to get potassium through an IV tube in one of your veins and be monitored in the hospital. Follow these instructions at home:  Take over-the-counter and prescription medicines only as told by your health care provider. This includes vitamins and supplements.  Eat a healthy diet. A healthy diet includes fresh fruits and vegetables, whole grains, healthy fats, and lean proteins.  If instructed, eat more foods that contain a lot of potassium, such as: ? Nuts, such as peanuts and pistachios. ? Seeds, such as sunflower seeds and pumpkin seeds. ? Peas, lentils, and lima beans. ? Whole grain and bran cereals and breads. ? Fresh fruits and vegetables, such as apricots, avocado, bananas, cantaloupe, kiwi, oranges, tomatoes, asparagus, and potatoes. ? Orange juice. ? Tomato juice. ? Red meats. ? Yogurt.  Keep all follow-up visits as told by your health care provider. This is important. Contact a health care provider if:  You have weakness that gets worse.  You feel your heart pounding or racing.  You vomit.  You have diarrhea.  You have diabetes (diabetes mellitus) and you have trouble keeping your blood sugar (glucose) in your target range. Get help right away if:  You have chest pain.  You have shortness of breath.  You have vomiting or diarrhea that lasts for more than 2 days.  You faint. This information is not intended to replace advice given to you by your health care provider. Make sure you discuss any questions you have with your health care provider. Document Released: 09/07/2005 Document Revised: 04/25/2016 Document Reviewed: 04/25/2016 Elsevier Interactive Patient Education  2018 [employer].    Nausea, Adult Nausea is the feeling of an upset stomach or having to vomit. Nausea on its own is not usually a serious concern, but it may be an  early sign of a more serious medical problem. As nausea gets worse, it can lead to vomiting. If vomiting develops, or if you are not able to drink enough fluids, you are at risk of becoming dehydrated. Dehydration can make you tired and thirsty, cause you to have a dry mouth, and decrease how often you urinate. Older adults and people with other diseases or a weak immune system are at higher risk for dehydration. The main goals of treating your nausea are:  To limit repeated nausea episodes.  To prevent vomiting and dehydration.  Follow these instructions at home: Follow instructions from your health care provider about how to care for yourself at home. Eating and drinking Follow these recommendations as told by your health care provider:  Take an oral rehydration solution (ORS). This is a drink that is sold at pharmacies and retail stores.  Drink  clear fluids in small amounts as you are able. Clear fluids include water, ice chips, diluted fruit juice, and low-calorie sports drinks.  Eat bland, easy-to-digest foods in small amounts as you are able. These foods include bananas, applesauce, rice, lean meats, toast, and crackers.  Avoid drinking fluids that contain a lot of sugar or caffeine, such as energy drinks, sports drinks, and soda.  Avoid alcohol.  Avoid spicy or fatty foods.  General instructions  Drink enough fluid to keep your urine clear or pale yellow.  Wash your hands often. If soap and water are not available, use hand sanitizer.  Make sure that all people in your household wash their hands well and often.  Rest at home while you recover.  Take over-the-counter and prescription medicines only as told by your health care provider.  Breathe slowly and deeply when you feel nauseous.  Watch your condition for any changes.  Keep all follow-up visits as told by your health care provider. This is important. Contact a health care provider if:  You have a headache.  You  have new symptoms.  Your nausea gets worse.  You have a fever.  You feel light-headed or dizzy.  You vomit.  You cannot keep fluids down. Get help right away if:  You have pain in your chest, neck, arm, or jaw.  You feel extremely weak or you faint.  You have vomit that is bright red or looks like coffee grounds.  You have bloody or black stools or stools that look like tar.  You have a severe headache, a stiff neck, or both.  You have severe pain, cramping, or bloating in your abdomen.  You have a rash.  You have difficulty breathing or are breathing very quickly.  Your heart is beating very quickly.  Your skin feels cold and clammy.  You feel confused.  You have pain when you urinate.  You have signs of dehydration, such as: ? Dark urine, very little, or no urine. ? Cracked lips. ? Dry mouth. ? Sunken eyes. ? Sleepiness. ? Weakness. These symptoms may represent a serious problem that is an emergency. Do not wait to see if the symptoms will go away. Get medical help right away. Call your local emergency services (911 in the [place] Do not drive yourself to the hospital. This information is not intended to replace advice given to you by your health care provider. Make sure you discuss any questions you have with your health care provider. Document Released: 10/15/2004 Document Revised: 02/10/2016 Document Reviewed: 05/14/2015 Elsevier Interactive Patient Education  [employer].

## 2017-11-26 NOTE — Telephone Encounter (Signed)
"  I need to come in today for IVF's before the weekend.  Nauseated and vomiting since Tuesday evening.  I have the nausea medicine from the nurse but every time I eat or drink it comes back up within twenty minutes.  Trying Ensure but it comes up too."  "I'll just come in and wait, I have to get something.  Recently hospitalized with the Flu for four days but still coughing.  Bowels have moved twice but not loose."

## 2017-11-29 NOTE — Progress Notes (Signed)
[facility] Progress Note   Theresa [name] [id number] 1961/01/12 57 y.o.  Theresa [name] is managed by Dr. Eilleen Kempf  Actively treated with chemotherapy: yes  Current Therapy: Carboplatin and paclitaxel  Last Treated: 11/16/2017 (cycle 5, day 1)  Assessment: Plan:    Dehydration - Plan: 0.9 %  sodium chloride infusion, dexamethasone (DECADRON) injection 10 mg, ondansetron (ZOFRAN) injection 8 mg, sodium chloride 0.9 % 250 mL with potassium chloride 20 mEq infusion, sodium chloride flush (NS) 0.9 % injection 10 mL, heparin lock flush 100 unit/mL  Nausea and vomiting, intractability of vomiting not specified, unspecified vomiting type - Plan: DISCONTINUED: ondansetron (ZOFRAN) 8 mg, dexamethasone (DECADRON) 10 mg in sodium chloride 0.9 % 50 mL IVPB  Hypokalemia - Plan: DISCONTINUED: potassium PHOSPHATE 20 mEq in dextrose 5 % 250 mL infusion   Nausea and vomiting with dehydration.  The patient was given Zofran 8 mg IV with Decadron 10 mg IV x1.  She was given 1 L of normal saline IV.  She was given a prescription for Ativan 0.5 milligrams sublingual for nausea and vomiting were sent to her pharmacy.  She was also instructed to pick up her prescriptions for Zofran and Compazine that had been sent to her pharmacy yesterday.  Hypokalemia: The patient's labs returned today showing a potassium of 2.9.  She was given 20 mEq of IV potassium while here.  She has a prescription at home for K-Dur 20 mEq twice daily for 5 days then once daily.  She has a lab appointment scheduled for 11/30/2017.  Please see After Visit Summary for patient specific instructions.  Future Appointments  Date Time Provider [place]  11/30/2017 12:15 PM [facility] LAB 1 [facility] None  11/30/2017 12:30 PM [facility] FLUSH NURSE 2 [facility] None  12/07/2017  8:30 AM [facility] LAB 2 [facility] None  12/07/2017  8:45 AM [facility] INJ NURSE [facility] None  12/07/2017  9:15 AM  Curt Bears, MD [facility] None  12/07/2017 10:15 AM [facility] H29 [facility] None  12/14/2017 11:45 AM [facility] LAB 1 [facility] None  12/14/2017 12:00 PM [facility] FLUSH NURSE [facility] None  12/21/2017 11:45 AM [facility] LAB 1 [facility] None  12/21/2017 12:00 PM [facility] FLUSH NURSE 2 [facility] None  12/28/2017  8:45 AM [facility] LAB 4 [facility] None  12/28/2017  9:00 AM [facility] FLUSH NURSE [facility] None  12/28/2017  9:30 AM Curt Bears, MD [facility] None  12/28/2017 10:30 AM [facility] I27 DNS [facility] None  01/04/2018 11:45 AM [facility] LAB 1 [facility] None  01/04/2018 12:00 PM [facility] FLUSH NURSE [facility] None  01/11/2018 11:45 AM [facility] LAB 1 [facility] None  01/11/2018 12:00 PM [facility] FLUSH NURSE [facility] None  01/18/2018  9:30 AM [facility] LAB 2 [facility] None  01/18/2018  9:45 AM [facility] FLUSH NURSE [facility] None  01/18/2018 10:15 AM Curt Bears, MD [facility] None  01/18/2018 11:00 AM [facility] G24 [facility] None    No orders of the defined types were placed in this encounter.      Subjective:   Patient ID:  Theresa [name] is a 57 y.o. (DOB 1961-01-11) female.  Chief Complaint:  Chief Complaint  Patient presents with  . Nausea    vomiting    HPI Theresa [name] is a 57 year old female with metastatic non-small cell lung cancer dating to her original diagnosis in November 2016.  She is managed by Dr. Eilleen Kempf and was last treated with carboplatin and paclitaxel on 11/16/2017 (cycle 5, day 1).  She had prescriptions for  Zofran, oxycodone, Compazine, and potassium sent to her pharmacy recently by Dr. Coral Else she has an ongoing cough med.  She has not yet picked these prescriptions up.  She presents to the office today with nausea and vomiting.  She has a prescription for anti-emetics and plans to pick these up today.  She has an ongoing cough and is spitting up phlegm.  She denies fevers,  chills, or sweats.  Medications: I have reviewed the patient's current medications.  Allergies: No Known Allergies  Past Medical History:  Diagnosis Date  . Cancer ([place])    5 years ago - cervical cancer  . Encounter for antineoplastic chemotherapy 08/27/2015  . History of radiation therapy 08/19/2015 - 10/07/2015   Site/dose:   The patient's primary tumor and involved lymph nodes were treated to 66 Gy in 30 fractions.  . Hypokalemia 09/30/2015  . Non-small cell carcinoma of lung, stage 3 ([place]) 08/08/2015  . Paroxysmal atrial flutter ([place]) 09/16/2015  . Pneumonia     Past Surgical History:  Procedure Laterality Date  . IR FLUORO GUIDE PORT INSERTION RIGHT  05/21/2017  . IR US GUIDE VASC ACCESS RIGHT  05/21/2017  . TUBAL LIGATION    . VIDEO BRONCHOSCOPY WITH ENDOBRONCHIAL ULTRASOUND N/A 08/02/2015   Procedure: VIDEO BRONCHOSCOPY WITH ENDOBRONCHIAL ULTRASOUND;  Surgeon: [name], MD;  Location: [facility] OR;  Service: Thoracic;  Laterality: N/A;  . WISDOM TOOTH EXTRACTION      Family History  Problem Relation Age of Onset  . COPD Mother   . Diabetes type II Mother   . Heart disease Mother   . High Cholesterol Mother   . Lung cancer Father     Social History   Socioeconomic History  . Marital status: Single    Spouse name: Not on file  . Number of children: Not on file  . Years of education: Not on file  . Highest education level: Not on file  Social Needs  . Financial resource strain: Not on file  . Food insecurity - worry: Not on file  . Food insecurity - inability: Not on file  . Transportation needs - medical: Not on file  . Transportation needs - non-medical: Not on file  Occupational History  . Not on file  Tobacco Use  . Smoking status: Former Smoker    Packs/day: 0.30    Years: 5.00    Pack years: 1.50    Types: Cigarettes    Last attempt to quit: 07/31/1990    Years since quitting: 27.3  . Smokeless tobacco: Never Used  Substance and Sexual Activity   . Alcohol use: No    Alcohol/week: 0.0 oz  . Drug use: No  . Sexual activity: Not Currently  Other Topics Concern  . Not on file  Social History Narrative  . Not on file    Past Medical History, Surgical history, Social history, and Family history were reviewed and updated as appropriate.   Please see review of systems for further details on the patient's review from today.   Review of Systems:  Review of Systems  Constitutional: Negative for chills, diaphoresis and fever.  HENT: Negative for trouble swallowing.   Respiratory: Positive for cough. Negative for choking, chest tightness, shortness of breath and wheezing.   Cardiovascular: Positive for palpitations. Negative for chest pain.  Gastrointestinal: Positive for nausea and vomiting. Negative for constipation and diarrhea.  Neurological: Negative for headaches.    Objective:   Physical Exam:  BP 115/69 (BP Location: Left Arm)  Pulse (!) 117   Temp 98.9 F (37.2 C) (Oral)   Resp (!) 24   Ht 5\' 8"  (1.727 m)   SpO2 100%   BMI 30.59 kg/m  ECOG: 1  Physical Exam  Constitutional: No distress.  HENT:  Head: Normocephalic and atraumatic.  Mouth/Throat: Oropharynx is clear and moist. No oropharyngeal exudate.  Cardiovascular: S1 normal and S2 normal. Tachycardia present.  Pulmonary/Chest: Effort normal and breath sounds normal. No respiratory distress. She has no wheezes. She has no rales.  Abdominal: Soft. Bowel sounds are normal. She exhibits no distension and no mass. There is no tenderness. There is no rebound and no guarding.  Neurological: She is alert.  Skin: Skin is warm and dry. She is not diaphoretic.  Psychiatric: She has a normal mood and affect. Her behavior is normal. Judgment and thought content normal.    Lab Review:     Component Value Date/Time   NA 139 11/26/2017 1424   NA 138 09/24/2017 1600   K 2.9 (LL) 11/26/2017 1424   K 3.4 (L) 09/24/2017 1600   CL 102 11/26/2017 1424   CO2 25  11/26/2017 1424   CO2 25 09/24/2017 1600   GLUCOSE 115 11/26/2017 1424   GLUCOSE 125 09/24/2017 1600   BUN 8 11/26/2017 1424   BUN 9.5 09/24/2017 1600   CREATININE 0.75 11/26/2017 1424   CREATININE 0.8 09/24/2017 1600   CALCIUM 9.5 11/26/2017 1424   CALCIUM 9.0 09/24/2017 1600   PROT 7.4 11/26/2017 1424   PROT 7.4 09/24/2017 1600   ALBUMIN 3.4 (L) 11/26/2017 1424   ALBUMIN 3.5 09/24/2017 1600   AST 31 11/26/2017 1424   AST 31 09/24/2017 1600   ALT 25 11/26/2017 1424   ALT 24 09/24/2017 1600   ALKPHOS 117 11/26/2017 1424   ALKPHOS 92 09/24/2017 1600   BILITOT 0.7 11/26/2017 1424   BILITOT 0.40 09/24/2017 1600   GFRNONAA >60 11/26/2017 1424   GFRAA >60 11/26/2017 1424       Component Value Date/Time   WBC 4.0 11/26/2017 1424   WBC 1.0 (L) 11/23/2017 0859   RBC 2.47 (L) 11/26/2017 1424   HGB 8.9 (L) 11/23/2017 0859   HGB 8.3 (L) 09/24/2017 1601   HCT 23.8 (L) 11/26/2017 1424   HCT 24.5 (L) 09/24/2017 1601   PLT 69 (L) 11/26/2017 1424   PLT 92 (L) 09/24/2017 1601   PLT 300 02/11/2017 1632   MCV 96.4 11/26/2017 1424   MCV 90.7 09/24/2017 1601   MCH 32.8 11/26/2017 1424   MCHC 34.0 11/26/2017 1424   RDW 19.5 (H) 11/26/2017 1424   RDW 18.7 (H) 09/24/2017 1601   LYMPHSABS 0.5 (L) 11/26/2017 1424   LYMPHSABS 0.8 (L) 09/24/2017 1601   MONOABS 0.6 11/26/2017 1424   MONOABS 0.2 09/24/2017 1601   EOSABS 0.0 11/26/2017 1424   EOSABS 0.0 09/24/2017 1601   BASOSABS 0.0 11/26/2017 1424   BASOSABS 0.0 09/24/2017 1601   -------------------------------  Imaging from last 24 hours (if applicable):  Radiology interpretation: Dg Chest 1 View  Result Date: 11/05/2017 CLINICAL DATA:  Status post right thoracentesis. EXAM: CHEST 1 VIEW COMPARISON:  11/01/2017 FINDINGS: There is a right chest wall port a catheter with tip in the projection of the cavoatrial junction. Decrease in volume of the right pleural effusion status post thoracentesis. No pneumothorax identified. The left lung  is clear. IMPRESSION: 1. Decreased volume of the right pleural effusion status post thoracentesis. No pneumothorax. Electronically Signed   By: Queen Slough.D.  On: 11/05/2017 12:38   Dg Chest 2 View  Result Date: 11/01/2017 CLINICAL DATA:  Acute onset of cough. Patient on chemotherapy for lung cancer. EXAM: CHEST  2 VIEW COMPARISON:  Chest radiograph performed 08/03/2017, and CT of the chest performed 10/25/2017 FINDINGS: There is a persistent moderate to large right-sided pleural effusion, reflecting the underlying malignancy. The left lung appears relatively clear. No pneumothorax is seen. The heart is normal in size, though difficult to fully assess given the pleural effusion. A right-sided chest port is noted ending about the mid SVC. No acute osseous abnormalities are seen. IMPRESSION: Persistent moderate to large right-sided pleural effusion, reflecting underlying malignancy. Electronically Signed   By: Garald Balding M.D.   On: 11/01/2017 22:09   US Thoracentesis Asp Pleural Space W/img Guide  Result Date: 11/05/2017 INDICATION: Metastatic non-small cell lung cancer, recurrent right pleural effusion. Request made for diagnostic and therapeutic right thoracentesis. EXAM: ULTRASOUND GUIDED DIAGNOSTIC AND THERAPEUTIC RIGHT THORACENTESIS MEDICATIONS: None. COMPLICATIONS: None immediate. PROCEDURE: An ultrasound guided thoracentesis was thoroughly discussed with the patient and questions answered. The benefits, risks, alternatives and complications were also discussed. The patient understands and wishes to proceed with the procedure. Written consent was obtained. Ultrasound was performed to localize and mark an adequate pocket of fluid in the right chest. The area was then prepped and draped in the normal sterile fashion. 1% Lidocaine was used for local anesthesia. Under ultrasound guidance a 6 Fr Safe-T-Centesis catheter was introduced. Thoracentesis was performed. The catheter was removed and a  dressing applied. FINDINGS: A total of approximately 1.5 liters of yellow fluid was removed. Samples were sent to the laboratory as requested by the clinical team.Only the above amount of fluid was removed today secondary to pt coughing/chest discomfort. IMPRESSION: Successful ultrasound guided diagnostic and therapeutic right thoracentesis yielding 1.5 liters of pleural fluid. Follow up chest xray revealed no pneumothorax. Read by: Rowe Robert, PA-C Electronically Signed   By: Corrie Mckusick D.O.   On: 11/05/2017 13:21

## 2017-11-30 ENCOUNTER — Inpatient Hospital Stay: Payer: Medicare Other

## 2017-11-30 ENCOUNTER — Other Ambulatory Visit: Payer: Self-pay | Admitting: *Deleted

## 2017-11-30 DIAGNOSIS — C3491 Malignant neoplasm of unspecified part of right bronchus or lung: Secondary | ICD-10-CM

## 2017-11-30 DIAGNOSIS — Z5111 Encounter for antineoplastic chemotherapy: Secondary | ICD-10-CM | POA: Diagnosis not present

## 2017-11-30 DIAGNOSIS — C3481 Malignant neoplasm of overlapping sites of right bronchus and lung: Secondary | ICD-10-CM | POA: Diagnosis not present

## 2017-11-30 DIAGNOSIS — E876 Hypokalemia: Secondary | ICD-10-CM | POA: Diagnosis not present

## 2017-11-30 DIAGNOSIS — E860 Dehydration: Secondary | ICD-10-CM | POA: Diagnosis not present

## 2017-11-30 DIAGNOSIS — Z5189 Encounter for other specified aftercare: Secondary | ICD-10-CM | POA: Diagnosis not present

## 2017-11-30 DIAGNOSIS — R112 Nausea with vomiting, unspecified: Secondary | ICD-10-CM | POA: Diagnosis not present

## 2017-11-30 LAB — CBC WITH DIFFERENTIAL/PLATELET
Basophils Absolute: 0 K/uL (ref 0.0–0.1)
Basophils Relative: 0 %
Eosinophils Absolute: 0 K/uL (ref 0.0–0.5)
Eosinophils Relative: 0 %
HCT: 24.1 % — ABNORMAL LOW (ref 34.8–46.6)
Hemoglobin: 8 g/dL — ABNORMAL LOW (ref 11.6–15.9)
Lymphocytes Relative: 9 %
Lymphs Abs: 0.4 K/uL — ABNORMAL LOW (ref 0.9–3.3)
MCH: 33.1 pg (ref 25.1–34.0)
MCHC: 33.2 g/dL (ref 31.5–36.0)
MCV: 99.6 fL (ref 79.5–101.0)
Monocytes Absolute: 0.6 K/uL (ref 0.1–0.9)
Monocytes Relative: 13 %
Neutro Abs: 3.5 K/uL (ref 1.5–6.5)
Neutrophils Relative %: 78 %
Platelets: 92 K/uL — ABNORMAL LOW (ref 145–400)
RBC: 2.42 MIL/uL — ABNORMAL LOW (ref 3.70–5.45)
RDW: 20.6 % — ABNORMAL HIGH (ref 11.2–14.5)
WBC: 4.6 K/uL (ref 3.9–10.3)

## 2017-11-30 LAB — COMPREHENSIVE METABOLIC PANEL WITH GFR
ALT: 18 U/L (ref 0–55)
AST: 21 U/L (ref 5–34)
Albumin: 3.2 g/dL — ABNORMAL LOW (ref 3.5–5.0)
Alkaline Phosphatase: 110 U/L (ref 40–150)
Anion gap: 9 (ref 3–11)
BUN: 6 mg/dL — ABNORMAL LOW (ref 7–26)
CO2: 25 mmol/L (ref 22–29)
Calcium: 9.4 mg/dL (ref 8.4–10.4)
Chloride: 105 mmol/L (ref 98–109)
Creatinine, Ser: 0.75 mg/dL (ref 0.60–1.10)
GFR calc Af Amer: >60 mL/min
GFR calc non Af Amer: >60 mL/min
Glucose, Bld: 109 mg/dL (ref 70–140)
Potassium: 3.2 mmol/L — ABNORMAL LOW (ref 3.5–5.1)
Sodium: 139 mmol/L (ref 136–145)
Total Bilirubin: 0.4 mg/dL (ref 0.2–1.2)
Total Protein: 7.2 g/dL (ref 6.4–8.3)

## 2017-11-30 MED ORDER — SODIUM CHLORIDE 0.9% FLUSH
10.0000 mL | INTRAVENOUS | Status: DC | PRN
  Administered 2017-11-30: 10 mL
  Filled 2017-11-30: qty 10

## 2017-11-30 MED ORDER — HEPARIN SOD (PORK) LOCK FLUSH 100 UNIT/ML IV SOLN
250.0000 [IU] | Freq: Once | INTRAVENOUS | Status: AC | PRN
  Administered 2017-11-30: 13:00:00
  Filled 2017-11-30: qty 5

## 2017-11-30 MED ORDER — OXYCODONE HCL 5 MG PO TABS: 5.0000 mg | ORAL_TABLET | Freq: Four times a day (QID) | ORAL | 0 refills | Status: DC | PRN

## 2017-11-30 NOTE — Telephone Encounter (Signed)
Pain medication refilled. Ready for pick up

## 2017-12-07 ENCOUNTER — Inpatient Hospital Stay (HOSPITAL_BASED_OUTPATIENT_CLINIC_OR_DEPARTMENT_OTHER): Payer: Medicare Other | Admitting: Internal Medicine

## 2017-12-07 ENCOUNTER — Inpatient Hospital Stay: Payer: Medicare Other

## 2017-12-07 ENCOUNTER — Encounter: Payer: Self-pay | Admitting: Internal Medicine

## 2017-12-07 VITALS — HR 118

## 2017-12-07 DIAGNOSIS — C3490 Malignant neoplasm of unspecified part of unspecified bronchus or lung: Secondary | ICD-10-CM

## 2017-12-07 DIAGNOSIS — G629 Polyneuropathy, unspecified: Secondary | ICD-10-CM

## 2017-12-07 DIAGNOSIS — C3481 Malignant neoplasm of overlapping sites of right bronchus and lung: Secondary | ICD-10-CM

## 2017-12-07 DIAGNOSIS — E876 Hypokalemia: Secondary | ICD-10-CM | POA: Diagnosis not present

## 2017-12-07 DIAGNOSIS — C3491 Malignant neoplasm of unspecified part of right bronchus or lung: Secondary | ICD-10-CM

## 2017-12-07 DIAGNOSIS — E860 Dehydration: Secondary | ICD-10-CM | POA: Diagnosis not present

## 2017-12-07 DIAGNOSIS — Z5189 Encounter for other specified aftercare: Secondary | ICD-10-CM | POA: Diagnosis not present

## 2017-12-07 DIAGNOSIS — Z95828 Presence of other vascular implants and grafts: Secondary | ICD-10-CM

## 2017-12-07 DIAGNOSIS — R112 Nausea with vomiting, unspecified: Secondary | ICD-10-CM | POA: Diagnosis not present

## 2017-12-07 DIAGNOSIS — Z5111 Encounter for antineoplastic chemotherapy: Secondary | ICD-10-CM | POA: Diagnosis not present

## 2017-12-07 LAB — COMPREHENSIVE METABOLIC PANEL
ALBUMIN: 3.4 g/dL — AB (ref 3.5–5.0)
ALT: 25 U/L (ref 0–55)
ANION GAP: 9 (ref 3–11)
AST: 38 U/L — ABNORMAL HIGH (ref 5–34)
Alkaline Phosphatase: 115 U/L (ref 40–150)
BILIRUBIN TOTAL: 0.6 mg/dL (ref 0.2–1.2)
BUN: 8 mg/dL (ref 7–26)
CHLORIDE: 104 mmol/L (ref 98–109)
CO2: 24 mmol/L (ref 22–29)
Calcium: 9.6 mg/dL (ref 8.4–10.4)
Creatinine, Ser: 0.77 mg/dL (ref 0.60–1.10)
GFR calc Af Amer: >60 mL/min (ref >60)
GFR calc non Af Amer: >60 mL/min (ref >60)
GLUCOSE: 107 mg/dL (ref 70–140)
POTASSIUM: 3.5 mmol/L (ref 3.5–5.1)
SODIUM: 137 mmol/L (ref 136–145)
TOTAL PROTEIN: 7.7 g/dL (ref 6.4–8.3)

## 2017-12-07 LAB — CBC WITH DIFFERENTIAL/PLATELET
BASOS ABS: 0 10*3/uL (ref 0.0–0.1)
Basophils Relative: 1 %
Eosinophils Absolute: 0 10*3/uL (ref 0.0–0.5)
Eosinophils Relative: 1 %
HEMATOCRIT: 25.4 % — AB (ref 34.8–46.6)
Hemoglobin: 8.4 g/dL — ABNORMAL LOW (ref 11.6–15.9)
LYMPHS ABS: 0.6 10*3/uL — AB (ref 0.9–3.3)
LYMPHS PCT: 15 %
MCH: 33.5 pg (ref 25.1–34.0)
MCHC: 33.1 g/dL (ref 31.5–36.0)
MCV: 101.2 fL — ABNORMAL HIGH (ref 79.5–101.0)
MONO ABS: 0.5 10*3/uL (ref 0.1–0.9)
Monocytes Relative: 11 %
NEUTROS ABS: 3.1 10*3/uL (ref 1.5–6.5)
Neutrophils Relative %: 72 %
Platelets: 117 10*3/uL — ABNORMAL LOW (ref 145–400)
RBC: 2.51 MIL/uL — AB (ref 3.70–5.45)
RDW: 20.9 % — ABNORMAL HIGH (ref 11.2–14.5)
WBC: 4.2 10*3/uL (ref 3.9–10.3)

## 2017-12-07 MED ORDER — PALONOSETRON HCL INJECTION 0.25 MG/5ML
0.2500 mg | Freq: Once | INTRAVENOUS | Status: AC
  Administered 2017-12-07: 0.25 mg via INTRAVENOUS

## 2017-12-07 MED ORDER — SODIUM CHLORIDE 0.9% FLUSH
10.0000 mL | INTRAVENOUS | Status: DC | PRN
  Administered 2017-12-07: 10 mL
  Filled 2017-12-07: qty 10

## 2017-12-07 MED ORDER — SODIUM CHLORIDE 0.9 % IV SOLN
Freq: Once | INTRAVENOUS | Status: AC
  Administered 2017-12-07: 10:00:00 via INTRAVENOUS

## 2017-12-07 MED ORDER — DIPHENHYDRAMINE HCL 50 MG/ML IJ SOLN
INTRAMUSCULAR | Status: AC
  Filled 2017-12-07: qty 1

## 2017-12-07 MED ORDER — SODIUM CHLORIDE 0.9 % IV SOLN
20.0000 mg | Freq: Once | INTRAVENOUS | Status: AC
  Administered 2017-12-07: 20 mg via INTRAVENOUS
  Filled 2017-12-07: qty 2

## 2017-12-07 MED ORDER — PEGFILGRASTIM 6 MG/0.6ML ~~LOC~~ PSKT
6.0000 mg | PREFILLED_SYRINGE | Freq: Once | SUBCUTANEOUS | Status: AC
  Administered 2017-12-07: 6 mg via SUBCUTANEOUS

## 2017-12-07 MED ORDER — PEGFILGRASTIM 6 MG/0.6ML ~~LOC~~ PSKT
PREFILLED_SYRINGE | SUBCUTANEOUS | Status: AC
  Filled 2017-12-07: qty 0.6

## 2017-12-07 MED ORDER — FAMOTIDINE IN NACL 20-0.9 MG/50ML-% IV SOLN
20.0000 mg | Freq: Once | INTRAVENOUS | Status: DC
  Filled 2017-12-07: qty 50

## 2017-12-07 MED ORDER — SODIUM CHLORIDE 0.9% FLUSH
10.0000 mL | INTRAVENOUS | Status: DC | PRN
  Administered 2017-12-07: 10 mL via INTRAVENOUS
  Filled 2017-12-07: qty 10

## 2017-12-07 MED ORDER — PALONOSETRON HCL INJECTION 0.25 MG/5ML
INTRAVENOUS | Status: AC
  Filled 2017-12-07: qty 5

## 2017-12-07 MED ORDER — HEPARIN SOD (PORK) LOCK FLUSH 100 UNIT/ML IV SOLN
500.0000 [IU] | Freq: Once | INTRAVENOUS | Status: AC | PRN
  Administered 2017-12-07: 500 [IU]
  Filled 2017-12-07: qty 5

## 2017-12-07 MED ORDER — SODIUM CHLORIDE 0.9 % IV SOLN
Freq: Once | INTRAVENOUS | Status: AC
  Administered 2017-12-07: 11:00:00 via INTRAVENOUS
  Filled 2017-12-07: qty 5

## 2017-12-07 MED ORDER — SODIUM CHLORIDE 0.9 % IV SOLN
150.0000 mg/m2 | Freq: Once | INTRAVENOUS | Status: AC
  Administered 2017-12-07: 318 mg via INTRAVENOUS
  Filled 2017-12-07: qty 53

## 2017-12-07 MED ORDER — SODIUM CHLORIDE 0.9 % IV SOLN
700.0000 mg | Freq: Once | INTRAVENOUS | Status: AC
  Administered 2017-12-07: 700 mg via INTRAVENOUS
  Filled 2017-12-07: qty 70

## 2017-12-07 MED ORDER — DIPHENHYDRAMINE HCL 50 MG/ML IJ SOLN
50.0000 mg | Freq: Once | INTRAMUSCULAR | Status: AC
  Administered 2017-12-07: 50 mg via INTRAVENOUS

## 2017-12-07 NOTE — Progress Notes (Signed)
[facility] Telephone:([phone]   [phone]) [phone]  OFFICE PROGRESS NOTE  Patient, No Pcp Per No address on file  DIAGNOSIS: Metastatic non-small cell lung cancer initially diagnosed as Stage IIIA (T2b, N2, M0) non-small cell lung cancer, invasive poorly differentiated squamous cell carcinoma diagnosed in November 2016 and presented with large right hilar mass with collapse of the right middle lobe and extension into the right upper lobe and right lower lobe along the hilum with right paratracheal and subcarinal lymphadenopathy. She presented in July 2018 with metastatic intramuscular mass in the right posterior lateral chest wall, with biopsy confirmed squamous cell carcinoma. PDL1 Expression: 50%.  PRIOR THERAPY:  1) Concurrent chemoradiation with weekly carboplatin for AUC of 2 and paclitaxel 45 MG/M2. First dose 08/19/2015. Status post 5 cycles. Last cycle was given 10/07/2015 with significant response. 2) Consolidation chemotherapy with carboplatin for AUC of 5 and paclitaxel 175 MG/M2 every 3 weeks with Neulasta support. First dose 11/18/2015. Status post 3 cycles. 3) palliative radiotherapy to the intramuscular mass in the right posterior lateral chest wall under the care of Dr. Tammi Klippel. 4) Ketruda 200 mg IV every 3 weeks. First dose 05/12/2017. Status post 3 cycles.  This was discontinued secondary to disease progression.  CURRENT THERAPY: Systemic chemotherapy with carboplatin for AC of 5 and paclitaxel 175 mg/M2 every 3 weeks with Neulasta support.  First dose August 26, 2017.  Status post 5 cycles.   INTERVAL HISTORY: Theresa [name] 57 y.o. female returns to the clinic today for follow-up visit.  The patient is feeling fine with no specific complaint except for pain on the right shoulder area.  She also has peripheral neuropathy in the right fingers.  She is currently on gabapentin 200 mg p.o. 3 times daily and her numbness is better.  She denied having  any recent chest pain, shortness of breath, cough or hemoptysis.  She denied having any fever or chills.  She has no nausea, vomiting, diarrhea or constipation.  She tolerated the last cycle of her treatment fairly well.   MEDICAL HISTORY: Past Medical History:  Diagnosis Date  . Cancer ([place])    5 years ago - cervical cancer  . Encounter for antineoplastic chemotherapy 08/27/2015  . History of radiation therapy 08/19/2015 - 10/07/2015   Site/dose:   The patient's primary tumor and involved lymph nodes were treated to 66 Gy in 30 fractions.  . Hypokalemia 09/30/2015  . Non-small cell carcinoma of lung, stage 3 ([place]) 08/08/2015  . Paroxysmal atrial flutter ([place]) 09/16/2015  . Pneumonia     ALLERGIES:  has No Known Allergies.  MEDICATIONS:  Current Outpatient Medications  Medication Sig Dispense Refill  . benzonatate (TESSALON) 100 MG capsule Take 1 capsule (100 mg total) by mouth 2 (two) times daily as needed for cough. 20 capsule 0  . diphenoxylate-atropine (LOMOTIL) 2.5-0.025 MG tablet Take 2 tablets by mouth 4 (four) times daily as needed for diarrhea or loose stools. (Patient not taking: Reported on 09/15/2017) 60 tablet 1  . gabapentin (NEURONTIN) 100 MG capsule Take 2 capsules (200 mg total) by mouth 3 (three) times daily. 180 capsule 1  . ibuprofen (ADVIL,MOTRIN) 800 MG tablet Take 1 tablet by mouth every 8 (eight) hours as needed.    . lactulose (CHRONULAC) 10 GM/15ML solution Take 15 mLs (10 g total) by mouth 3 (three) times daily as needed for mild constipation. (Patient not taking: Reported on 09/02/2017) 240 mL 3  . lidocaine-prilocaine (EMLA) cream Apply  1 application topically as needed. 30 g 2  . LORazepam (ATIVAN) 0.5 MG tablet 0.5 mg sublingual p.o. every 6 hours as needed for nausea and/or vomiting. (Patient not taking: Reported on 10/15/2017) 40 tablet 0  . metoprolol tartrate (LOPRESSOR) 25 MG tablet Take 0.5 tablets (12.5 mg total) by mouth 2 (two) times daily. 30 tablet 0   . Multiple Vitamin (MULTIVITAMIN) capsule Take 1 capsule daily by mouth. 30 capsule 2  . ondansetron (ZOFRAN-ODT) 8 MG disintegrating tablet Take 1 tablet (8 mg total) by mouth every 8 (eight) hours as needed for nausea or vomiting. 20 tablet 1  . oxyCODONE (OXY IR/ROXICODONE) 5 MG immediate release tablet Take 1 tablet (5 mg total) by mouth every 6 (six) hours as needed for severe pain. 60 tablet 0  . potassium chloride SA (K-DUR,KLOR-CON) 20 MEQ tablet Take 2 tablets twice a day for 5 days then 1 tablet daily. 40 tablet 0  . prochlorperazine (COMPAZINE) 10 MG tablet Take 1 tablet (10 mg total) by mouth every 6 (six) hours as needed for nausea or vomiting. 30 tablet 1   No current facility-administered medications for this visit.    Facility-Administered Medications Ordered in Other Visits  Medication Dose Route Frequency Provider Last Rate Last Dose  . 0.9 %  sodium chloride infusion   Intravenous Continuous Curcio, Kristin R, NP      . 0.9 %  sodium chloride infusion   Intravenous Continuous Curt Bears, MD   Stopped at 05/31/17 1714  . ondansetron (ZOFRAN) injection 8 mg  8 mg Intravenous Once Curt Bears, MD      . sodium chloride flush (NS) 0.9 % injection 10 mL  10 mL Intravenous PRN Curt Bears, MD   10 mL at 12/07/17 0908    SURGICAL HISTORY:  Past Surgical History:  Procedure Laterality Date  . IR FLUORO GUIDE PORT INSERTION RIGHT  05/21/2017  . IR US GUIDE VASC ACCESS RIGHT  05/21/2017  . TUBAL LIGATION    . VIDEO BRONCHOSCOPY WITH ENDOBRONCHIAL ULTRASOUND N/A 08/02/2015   Procedure: VIDEO BRONCHOSCOPY WITH ENDOBRONCHIAL ULTRASOUND;  Surgeon: [name], MD;  Location: [facility];  Service: Thoracic;  Laterality: N/A;  . WISDOM TOOTH EXTRACTION      REVIEW OF SYSTEMS:  A comprehensive review of systems was negative except for: Musculoskeletal: positive for myalgias Neurological: positive for paresthesia   PHYSICAL EXAMINATION: General appearance: alert,  cooperative and no distress Head: Normocephalic, without obvious abnormality, atraumatic Neck: no adenopathy, no JVD, supple, symmetrical, trachea midline and thyroid not enlarged, symmetric, no tenderness/mass/nodules Lymph nodes: Cervical, supraclavicular, and axillary nodes normal. Resp: clear to auscultation bilaterally Back: symmetric, no curvature. ROM normal. No CVA tenderness. Cardio: regular rate and rhythm, S1, S2 normal, no murmur, click, rub or gallop GI: soft, non-tender; bowel sounds normal; no masses,  no organomegaly Extremities: extremities normal, atraumatic, no cyanosis or edema  Examination of the back showed a palpable mass above the right scapula.  ECOG PERFORMANCE STATUS: 1 - Symptomatic but completely ambulatory  Blood pressure 133/75, pulse (!) 126, temperature 98.1 F (36.7 C), temperature source Oral, resp. rate 18, height '5\' 8"'  (1.727 m), weight 197 lb 12.8 oz (89.7 kg), SpO2 96 %.  LABORATORY DATA: Lab Results  Component Value Date   WBC 4.6 11/30/2017   HGB 8.0 (L) 11/30/2017   HCT 24.1 (L) 11/30/2017   MCV 99.6 11/30/2017   PLT 92 (L) 11/30/2017      Chemistry      Component Value  Date/Time   NA 139 11/30/2017 1152   NA 138 09/24/2017 1600   K 3.2 (L) 11/30/2017 1152   K 3.4 (L) 09/24/2017 1600   CL 105 11/30/2017 1152   CO2 25 11/30/2017 1152   CO2 25 09/24/2017 1600   BUN 6 (L) 11/30/2017 1152   BUN 9.5 09/24/2017 1600   CREATININE 0.75 11/30/2017 1152   CREATININE 0.75 11/26/2017 1424   CREATININE 0.8 09/24/2017 1600      Component Value Date/Time   CALCIUM 9.4 11/30/2017 1152   CALCIUM 9.0 09/24/2017 1600   ALKPHOS 110 11/30/2017 1152   ALKPHOS 92 09/24/2017 1600   AST 21 11/30/2017 1152   AST 31 11/26/2017 1424   AST 31 09/24/2017 1600   ALT 18 11/30/2017 1152   ALT 25 11/26/2017 1424   ALT 24 09/24/2017 1600   BILITOT 0.4 11/30/2017 1152   BILITOT 0.7 11/26/2017 1424   BILITOT 0.40 09/24/2017 1600       RADIOGRAPHIC  STUDIES: No results found.  ASSESSMENT AND PLAN:  This is a very pleasant 57 years old African-American female with metastatic non-small cell lung cancer that was initially diagnosed as stage IIIA non-small cell lung cancer, squamous cell carcinoma presented with large right hilar mass and mediastinal lymphadenopathy diagnosed in November 2016. She status post concurrent chemoradiation followed by consolidation chemotherapy. The patient was found recently to have intramuscular metastases and the right posterior chest wall that was biopsy-proven to be squamous cell carcinoma with PDL 1 expression 50%. The patient was a started on treatment with immunotherapy with Ketruda (pembrolizumab) status post 3 cycles. Unfortunately imaging studies after the treatment with Keytruda showed evidence for disease progression. The patient is currently undergoing treatment with carboplatin for AUC of 5 and paclitaxel 175 mg/M2 status post 5 cycles.  She continues to tolerate this treatment very well especially after reducing the dose of paclitaxel to 150 mg/M2 starting from cycle #2. The patient tolerated the last cycle of her treatment well. I recommended for her to proceed with cycle #6 today as a scheduled. I will see her back for follow-up visit in 4 weeks for evaluation after repeating CT scan of the chest, abdomen and pelvis for restaging of her disease. For the peripheral neuropathy, I will start the patient on Neurontin 200 mg p.o. 3 times daily. The patient was advised to call immediately if she has any concerning symptoms in the interval. The patient voices understanding of current disease status and treatment options and is in agreement with the current care plan. All questions were answered. The patient knows to call the clinic with any problems, questions or concerns. We can certainly see the patient much sooner if necessary.  Disclaimer: This note was dictated with voice recognition software. Similar  sounding words can inadvertently be transcribed and may not be corrected upon review.

## 2017-12-07 NOTE — Progress Notes (Signed)
Per Diane desk RN for Dr Julien Nordmann ok to tx today with pulse of 118.

## 2017-12-07 NOTE — Patient Instructions (Signed)
   [facility] Discharge Instructions for Patients Receiving Chemotherapy  Today you received the following chemotherapy agents Taxol and Carboplatin   To help prevent nausea and vomiting after your treatment, we encourage you to take your nausea medication as directed.    If you develop nausea and vomiting that is not controlled by your nausea medication, call the clinic.   BELOW ARE SYMPTOMS THAT SHOULD BE REPORTED IMMEDIATELY:  *FEVER GREATER THAN 100.5 F  *CHILLS WITH OR WITHOUT FEVER  NAUSEA AND VOMITING THAT IS NOT CONTROLLED WITH YOUR NAUSEA MEDICATION  *UNUSUAL SHORTNESS OF BREATH  *UNUSUAL BRUISING OR BLEEDING  TENDERNESS IN MOUTH AND THROAT WITH OR WITHOUT PRESENCE OF ULCERS  *URINARY PROBLEMS  *BOWEL PROBLEMS  UNUSUAL RASH Items with * indicate a potential emergency and should be followed up as soon as possible.  Feel free to call the clinic should you have any questions or concerns. The clinic phone number is [phone].  Please show the CHEMO ALERT CARD at check-in to the Emergency Department and triage nurse.   

## 2017-12-08 ENCOUNTER — Telehealth: Payer: Self-pay | Admitting: Internal Medicine

## 2017-12-08 NOTE — Telephone Encounter (Signed)
Scheduled appt per 3/19 los - patient is aware of appt date and time.

## 2017-12-14 ENCOUNTER — Other Ambulatory Visit: Payer: Self-pay | Admitting: Medical Oncology

## 2017-12-14 ENCOUNTER — Inpatient Hospital Stay: Payer: Medicare Other

## 2017-12-14 ENCOUNTER — Telehealth: Payer: Self-pay | Admitting: *Deleted

## 2017-12-14 ENCOUNTER — Ambulatory Visit: Payer: Medicare Other

## 2017-12-14 ENCOUNTER — Inpatient Hospital Stay: Payer: Medicare Other | Admitting: Oncology

## 2017-12-14 VITALS — BP 119/76 | HR 118 | Temp 98.9°F | Resp 18

## 2017-12-14 DIAGNOSIS — E860 Dehydration: Secondary | ICD-10-CM

## 2017-12-14 DIAGNOSIS — D649 Anemia, unspecified: Secondary | ICD-10-CM

## 2017-12-14 DIAGNOSIS — R112 Nausea with vomiting, unspecified: Secondary | ICD-10-CM

## 2017-12-14 DIAGNOSIS — Z5189 Encounter for other specified aftercare: Secondary | ICD-10-CM | POA: Diagnosis not present

## 2017-12-14 DIAGNOSIS — C3491 Malignant neoplasm of unspecified part of right bronchus or lung: Secondary | ICD-10-CM

## 2017-12-14 DIAGNOSIS — E876 Hypokalemia: Secondary | ICD-10-CM | POA: Diagnosis not present

## 2017-12-14 DIAGNOSIS — Z5111 Encounter for antineoplastic chemotherapy: Secondary | ICD-10-CM | POA: Diagnosis not present

## 2017-12-14 DIAGNOSIS — C3481 Malignant neoplasm of overlapping sites of right bronchus and lung: Secondary | ICD-10-CM | POA: Diagnosis not present

## 2017-12-14 LAB — COMPREHENSIVE METABOLIC PANEL
ALBUMIN: 3.3 g/dL — AB (ref 3.5–5.0)
ALT: 31 U/L (ref 0–55)
ANION GAP: 10 (ref 3–11)
AST: 37 U/L — ABNORMAL HIGH (ref 5–34)
Alkaline Phosphatase: 118 U/L (ref 40–150)
BILIRUBIN TOTAL: 0.9 mg/dL (ref 0.2–1.2)
BUN: 9 mg/dL (ref 7–26)
CO2: 27 mmol/L (ref 22–29)
Calcium: 9.3 mg/dL (ref 8.4–10.4)
Chloride: 98 mmol/L (ref 98–109)
Creatinine, Ser: 0.75 mg/dL (ref 0.60–1.10)
GFR calc Af Amer: >60 mL/min (ref >60)
Glucose, Bld: 127 mg/dL (ref 70–140)
Potassium: 3 mmol/L — CL (ref 3.5–5.1)
Sodium: 135 mmol/L — ABNORMAL LOW (ref 136–145)
TOTAL PROTEIN: 7.5 g/dL (ref 6.4–8.3)

## 2017-12-14 LAB — CBC WITH DIFFERENTIAL/PLATELET
BASOS PCT: 2 %
Basophils Absolute: 0 10*3/uL (ref 0.0–0.1)
EOS PCT: 0 %
Eosinophils Absolute: 0 10*3/uL (ref 0.0–0.5)
HEMATOCRIT: 24.7 % — AB (ref 34.8–46.6)
Hemoglobin: 8.2 g/dL — ABNORMAL LOW (ref 11.6–15.9)
Lymphocytes Relative: 41 %
Lymphs Abs: 0.5 10*3/uL — ABNORMAL LOW (ref 0.9–3.3)
MCH: 34 pg (ref 25.1–34.0)
MCHC: 33.3 g/dL (ref 31.5–36.0)
MCV: 102.1 fL — AB (ref 79.5–101.0)
MONO ABS: 0.1 10*3/uL (ref 0.1–0.9)
MONOS PCT: 13 %
Neutro Abs: 0.5 10*3/uL — CL (ref 1.5–6.5)
Neutrophils Relative %: 44 %
PLATELETS: 66 10*3/uL — AB (ref 145–400)
RBC: 2.42 MIL/uL — ABNORMAL LOW (ref 3.70–5.45)
RDW: 21.9 % — AB (ref 11.2–14.5)
WBC: 1.1 10*3/uL — ABNORMAL LOW (ref 3.9–10.3)

## 2017-12-14 LAB — PREPARE RBC (CROSSMATCH)

## 2017-12-14 MED ORDER — ACETAMINOPHEN 325 MG PO TABS
ORAL_TABLET | ORAL | Status: AC
  Filled 2017-12-14: qty 2

## 2017-12-14 MED ORDER — ONDANSETRON 8 MG PO TBDP: 8.0000 mg | ORAL_TABLET | Freq: Three times a day (TID) | ORAL | 1 refills | Status: DC | PRN

## 2017-12-14 MED ORDER — SODIUM CHLORIDE 0.9 % IV SOLN
250.0000 mL | Freq: Once | INTRAVENOUS | Status: AC
  Administered 2017-12-14: 250 mL via INTRAVENOUS

## 2017-12-14 MED ORDER — SODIUM CHLORIDE 0.9% FLUSH
10.0000 mL | INTRAVENOUS | Status: AC | PRN
  Administered 2017-12-14: 10 mL
  Filled 2017-12-14: qty 10

## 2017-12-14 MED ORDER — SODIUM CHLORIDE 0.9% FLUSH
10.0000 mL | INTRAVENOUS | Status: DC | PRN
  Administered 2017-12-14: 10 mL
  Filled 2017-12-14: qty 10

## 2017-12-14 MED ORDER — DIPHENHYDRAMINE HCL 25 MG PO CAPS
25.0000 mg | ORAL_CAPSULE | Freq: Once | ORAL | Status: AC
  Administered 2017-12-14: 25 mg via ORAL

## 2017-12-14 MED ORDER — HEPARIN SOD (PORK) LOCK FLUSH 100 UNIT/ML IV SOLN
500.0000 [IU] | Freq: Every day | INTRAVENOUS | Status: AC | PRN
  Administered 2017-12-14: 500 [IU]
  Filled 2017-12-14: qty 5

## 2017-12-14 MED ORDER — DIPHENHYDRAMINE HCL 25 MG PO CAPS
ORAL_CAPSULE | ORAL | Status: AC
Start: 2017-12-14 — End: ?
  Filled 2017-12-14: qty 2

## 2017-12-14 MED ORDER — ACETAMINOPHEN 325 MG PO TABS
650.0000 mg | ORAL_TABLET | Freq: Once | ORAL | Status: AC
  Administered 2017-12-14: 650 mg via ORAL

## 2017-12-14 MED ORDER — ONDANSETRON HCL 4 MG/2ML IJ SOLN: 8.0000 mg | INTRAMUSCULAR | Status: DC

## 2017-12-14 MED ORDER — ONDANSETRON HCL 4 MG/2ML IJ SOLN
INTRAMUSCULAR | Status: AC
  Filled 2017-12-14: qty 4

## 2017-12-14 MED ORDER — HEPARIN SOD (PORK) LOCK FLUSH 100 UNIT/ML IV SOLN
500.0000 [IU] | Freq: Once | INTRAVENOUS | Status: AC | PRN
  Administered 2017-12-14: 500 [IU]
  Filled 2017-12-14: qty 5

## 2017-12-14 MED ORDER — ONDANSETRON HCL 4 MG/2ML IJ SOLN
8.0000 mg | Freq: Once | INTRAMUSCULAR | Status: AC
  Administered 2017-12-14: 8 mg via INTRAVENOUS

## 2017-12-14 NOTE — Telephone Encounter (Signed)
Patient ANC 0.5.  Per Myrtha Mantis, NP need to call and explain neutropenic precautions and encourage fluid intake.  LM for returned call.

## 2017-12-14 NOTE — Patient Instructions (Signed)

## 2017-12-15 ENCOUNTER — Telehealth: Payer: Self-pay | Admitting: *Deleted

## 2017-12-15 NOTE — Progress Notes (Signed)
This was an infusion visit only.

## 2017-12-15 NOTE — Telephone Encounter (Signed)
Fax for wheel chair and walker with wheels faxed to [facility]

## 2017-12-16 ENCOUNTER — Inpatient Hospital Stay: Payer: Medicare Other | Attending: Internal Medicine

## 2017-12-16 ENCOUNTER — Other Ambulatory Visit: Payer: Self-pay | Admitting: Medical Oncology

## 2017-12-16 ENCOUNTER — Other Ambulatory Visit: Payer: Self-pay | Admitting: Oncology

## 2017-12-16 DIAGNOSIS — D630 Anemia in neoplastic disease: Secondary | ICD-10-CM

## 2017-12-16 DIAGNOSIS — D649 Anemia, unspecified: Secondary | ICD-10-CM | POA: Diagnosis not present

## 2017-12-16 LAB — PREPARE RBC (CROSSMATCH)

## 2017-12-16 MED ORDER — ACETAMINOPHEN 325 MG PO TABS
650.0000 mg | ORAL_TABLET | Freq: Once | ORAL | Status: AC
  Administered 2017-12-16: 650 mg via ORAL

## 2017-12-16 MED ORDER — SODIUM CHLORIDE 0.9 % IV SOLN: 250.0000 mL | Freq: Once | INTRAVENOUS | Status: DC

## 2017-12-16 MED ORDER — SODIUM CHLORIDE 0.9% FLUSH
10.0000 mL | INTRAVENOUS | Status: AC | PRN
  Administered 2017-12-16: 10 mL
  Filled 2017-12-16: qty 10

## 2017-12-16 MED ORDER — HEPARIN SOD (PORK) LOCK FLUSH 100 UNIT/ML IV SOLN
250.0000 [IU] | INTRAVENOUS | Status: DC | PRN
  Filled 2017-12-16: qty 5

## 2017-12-16 MED ORDER — HEPARIN SOD (PORK) LOCK FLUSH 100 UNIT/ML IV SOLN
500.0000 [IU] | Freq: Every day | INTRAVENOUS | Status: AC | PRN
  Administered 2017-12-16: 500 [IU]
  Filled 2017-12-16: qty 5

## 2017-12-16 MED ORDER — DIPHENHYDRAMINE HCL 25 MG PO CAPS
25.0000 mg | ORAL_CAPSULE | Freq: Once | ORAL | Status: AC
  Administered 2017-12-16: 25 mg via ORAL

## 2017-12-16 MED ORDER — SODIUM CHLORIDE 0.9% FLUSH
3.0000 mL | INTRAVENOUS | Status: DC | PRN
  Filled 2017-12-16: qty 10

## 2017-12-16 MED ORDER — SODIUM CHLORIDE 0.9% FLUSH
10.0000 mL | INTRAVENOUS | Status: DC | PRN
  Filled 2017-12-16: qty 10

## 2017-12-16 MED ORDER — DIPHENHYDRAMINE HCL 25 MG PO CAPS
ORAL_CAPSULE | ORAL | Status: AC
  Filled 2017-12-16: qty 1

## 2017-12-16 MED ORDER — ACETAMINOPHEN 325 MG PO TABS
ORAL_TABLET | ORAL | Status: AC
  Filled 2017-12-16: qty 2

## 2017-12-17 LAB — BPAM RBC
BLOOD PRODUCT EXPIRATION DATE: 201904122359
BLOOD PRODUCT EXPIRATION DATE: 201904122359
ISSUE DATE / TIME: 201903281535
ISSUE DATE / TIME: 201903261517
UNIT TYPE AND RH: 5100
Unit Type and Rh: 5100

## 2017-12-17 LAB — TYPE AND SCREEN
ABO/RH(D): O POS
ANTIBODY SCREEN: NEGATIVE
UNIT DIVISION: 0
UNIT DIVISION: 0

## 2017-12-21 ENCOUNTER — Inpatient Hospital Stay: Payer: Medicare Other | Attending: Internal Medicine

## 2017-12-21 ENCOUNTER — Inpatient Hospital Stay: Payer: Medicare Other

## 2017-12-21 DIAGNOSIS — G629 Polyneuropathy, unspecified: Secondary | ICD-10-CM | POA: Diagnosis not present

## 2017-12-21 DIAGNOSIS — C3481 Malignant neoplasm of overlapping sites of right bronchus and lung: Secondary | ICD-10-CM | POA: Diagnosis not present

## 2017-12-21 DIAGNOSIS — E876 Hypokalemia: Secondary | ICD-10-CM | POA: Insufficient documentation

## 2017-12-21 DIAGNOSIS — Z452 Encounter for adjustment and management of vascular access device: Secondary | ICD-10-CM | POA: Diagnosis not present

## 2017-12-21 DIAGNOSIS — E860 Dehydration: Secondary | ICD-10-CM

## 2017-12-21 DIAGNOSIS — C3491 Malignant neoplasm of unspecified part of right bronchus or lung: Secondary | ICD-10-CM

## 2017-12-21 LAB — CBC WITH DIFFERENTIAL/PLATELET
BASOS ABS: 0 10*3/uL (ref 0.0–0.1)
BASOS PCT: 0 %
EOS PCT: 0 %
Eosinophils Absolute: 0 10*3/uL (ref 0.0–0.5)
HCT: 28.6 % — ABNORMAL LOW (ref 34.8–46.6)
Hemoglobin: 9.5 g/dL — ABNORMAL LOW (ref 11.6–15.9)
Lymphocytes Relative: 18 %
Lymphs Abs: 0.6 10*3/uL — ABNORMAL LOW (ref 0.9–3.3)
MCH: 32.6 pg (ref 25.1–34.0)
MCHC: 33.2 g/dL (ref 31.5–36.0)
MCV: 98.3 fL (ref 79.5–101.0)
MONO ABS: 0.4 10*3/uL (ref 0.1–0.9)
Monocytes Relative: 11 %
Neutro Abs: 2.2 10*3/uL (ref 1.5–6.5)
Neutrophils Relative %: 71 %
PLATELETS: 71 10*3/uL — AB (ref 145–400)
RBC: 2.91 MIL/uL — ABNORMAL LOW (ref 3.70–5.45)
RDW: 18.8 % — AB (ref 11.2–14.5)
WBC: 3.2 10*3/uL — ABNORMAL LOW (ref 3.9–10.3)

## 2017-12-21 LAB — COMPREHENSIVE METABOLIC PANEL
ALBUMIN: 3.1 g/dL — AB (ref 3.5–5.0)
ALK PHOS: 112 U/L (ref 40–150)
ALT: 21 U/L (ref 0–55)
AST: 27 U/L (ref 5–34)
Anion gap: 9 (ref 3–11)
BILIRUBIN TOTAL: 0.5 mg/dL (ref 0.2–1.2)
BUN: 9 mg/dL (ref 7–26)
CALCIUM: 9.6 mg/dL (ref 8.4–10.4)
CO2: 27 mmol/L (ref 22–29)
CREATININE: 0.71 mg/dL (ref 0.60–1.10)
Chloride: 105 mmol/L (ref 98–109)
GFR calc Af Amer: >60 mL/min (ref >60)
GFR calc non Af Amer: >60 mL/min (ref >60)
GLUCOSE: 96 mg/dL (ref 70–140)
Potassium: 3.2 mmol/L — ABNORMAL LOW (ref 3.5–5.1)
Sodium: 141 mmol/L (ref 136–145)
TOTAL PROTEIN: 7.1 g/dL (ref 6.4–8.3)

## 2017-12-21 MED ORDER — HEPARIN SOD (PORK) LOCK FLUSH 100 UNIT/ML IV SOLN
250.0000 [IU] | Freq: Once | INTRAVENOUS | Status: AC | PRN
  Administered 2017-12-21: 100 [IU]
  Filled 2017-12-21: qty 5

## 2017-12-21 MED ORDER — SODIUM CHLORIDE 0.9% FLUSH
10.0000 mL | INTRAVENOUS | Status: DC | PRN
  Administered 2017-12-21: 10 mL
  Filled 2017-12-21: qty 10

## 2017-12-22 ENCOUNTER — Telehealth: Payer: Self-pay | Admitting: Medical Oncology

## 2017-12-22 ENCOUNTER — Other Ambulatory Visit: Payer: Self-pay | Admitting: Medical Oncology

## 2017-12-22 DIAGNOSIS — C3491 Malignant neoplasm of unspecified part of right bronchus or lung: Secondary | ICD-10-CM

## 2017-12-22 DIAGNOSIS — G609 Hereditary and idiopathic neuropathy, unspecified: Secondary | ICD-10-CM

## 2017-12-22 MED ORDER — OXYCODONE HCL 5 MG PO TABS: 5.0000 mg | ORAL_TABLET | Freq: Four times a day (QID) | ORAL | 0 refills | Status: DC | PRN

## 2017-12-22 MED ORDER — GABAPENTIN 100 MG PO CAPS: 300.0000 mg | ORAL_CAPSULE | Freq: Three times a day (TID) | ORAL | 0 refills | Status: DC

## 2017-12-22 NOTE — Telephone Encounter (Signed)
PN in feet worse. "If my grandson touches my feet I would kick him out the door." Per Theresa [name] increase Neurontin to 300 mg tid. Oxycodone refill done.

## 2017-12-24 ENCOUNTER — Ambulatory Visit: Payer: Medicare Other | Admitting: Urgent Care

## 2017-12-28 ENCOUNTER — Inpatient Hospital Stay: Payer: Medicare Other

## 2017-12-28 ENCOUNTER — Ambulatory Visit: Payer: Medicare Other | Admitting: Internal Medicine

## 2017-12-28 ENCOUNTER — Ambulatory Visit: Payer: Medicare Other

## 2017-12-28 DIAGNOSIS — Z452 Encounter for adjustment and management of vascular access device: Secondary | ICD-10-CM | POA: Diagnosis not present

## 2017-12-28 DIAGNOSIS — E876 Hypokalemia: Secondary | ICD-10-CM | POA: Diagnosis not present

## 2017-12-28 DIAGNOSIS — C3491 Malignant neoplasm of unspecified part of right bronchus or lung: Secondary | ICD-10-CM

## 2017-12-28 DIAGNOSIS — C3481 Malignant neoplasm of overlapping sites of right bronchus and lung: Secondary | ICD-10-CM | POA: Diagnosis not present

## 2017-12-28 DIAGNOSIS — E860 Dehydration: Secondary | ICD-10-CM

## 2017-12-28 DIAGNOSIS — G629 Polyneuropathy, unspecified: Secondary | ICD-10-CM | POA: Diagnosis not present

## 2017-12-28 LAB — COMPREHENSIVE METABOLIC PANEL
ALT: 21 U/L (ref 0–55)
AST: 46 U/L — ABNORMAL HIGH (ref 5–34)
Albumin: 3.5 g/dL (ref 3.5–5.0)
Alkaline Phosphatase: 124 U/L (ref 40–150)
Anion gap: 11 (ref 3–11)
BUN: 7 mg/dL (ref 7–26)
CALCIUM: 9.8 mg/dL (ref 8.4–10.4)
CHLORIDE: 101 mmol/L (ref 98–109)
CO2: 24 mmol/L (ref 22–29)
Creatinine, Ser: 0.75 mg/dL (ref 0.60–1.10)
Glucose, Bld: 102 mg/dL (ref 70–140)
Potassium: 3.5 mmol/L (ref 3.5–5.1)
Sodium: 136 mmol/L (ref 136–145)
TOTAL PROTEIN: 8.1 g/dL (ref 6.4–8.3)
Total Bilirubin: 0.8 mg/dL (ref 0.2–1.2)

## 2017-12-28 LAB — CBC WITH DIFFERENTIAL/PLATELET
Basophils Absolute: 0 10*3/uL (ref 0.0–0.1)
Basophils Relative: 0 %
Eosinophils Absolute: 0 10*3/uL (ref 0.0–0.5)
Eosinophils Relative: 0 %
HEMATOCRIT: 30.5 % — AB (ref 34.8–46.6)
Hemoglobin: 10.1 g/dL — ABNORMAL LOW (ref 11.6–15.9)
LYMPHS PCT: 12 %
Lymphs Abs: 0.6 10*3/uL — ABNORMAL LOW (ref 0.9–3.3)
MCH: 32.5 pg (ref 25.1–34.0)
MCHC: 33.1 g/dL (ref 31.5–36.0)
MCV: 98.1 fL (ref 79.5–101.0)
MONO ABS: 0.6 10*3/uL (ref 0.1–0.9)
MONOS PCT: 12 %
NEUTROS ABS: 4.1 10*3/uL (ref 1.5–6.5)
Neutrophils Relative %: 76 %
Platelets: 105 10*3/uL — ABNORMAL LOW (ref 145–400)
RBC: 3.11 MIL/uL — ABNORMAL LOW (ref 3.70–5.45)
RDW: 18.5 % — AB (ref 11.2–14.5)
WBC: 5.4 10*3/uL (ref 3.9–10.3)

## 2017-12-28 MED ORDER — HEPARIN SOD (PORK) LOCK FLUSH 100 UNIT/ML IV SOLN
500.0000 [IU] | Freq: Once | INTRAVENOUS | Status: AC | PRN
  Administered 2017-12-28: 500 [IU]
  Filled 2017-12-28: qty 5

## 2017-12-28 MED ORDER — SODIUM CHLORIDE 0.9% FLUSH
10.0000 mL | INTRAVENOUS | Status: DC | PRN
  Administered 2017-12-28: 10 mL
  Filled 2017-12-28: qty 10

## 2017-12-29 ENCOUNTER — Telehealth: Payer: Self-pay | Admitting: Urgent Care

## 2017-12-29 NOTE — Telephone Encounter (Signed)
Called to see if I could reschedule his missed appt from 4/5. Left VM per DPR to call back to the office if he needs to reschedule. When pt calls back, please reschedule him at his convenience.   Thanks!

## 2017-12-30 ENCOUNTER — Encounter (HOSPITAL_COMMUNITY): Payer: Self-pay

## 2017-12-30 ENCOUNTER — Ambulatory Visit (HOSPITAL_COMMUNITY)
Admission: RE | Admit: 2017-12-30 | Discharge: 2017-12-30 | Disposition: A | Discharge status: Home / Self Care | Payer: Medicare Other | Source: Ambulatory Visit | Attending: Internal Medicine | Admitting: Internal Medicine

## 2017-12-30 DIAGNOSIS — C3490 Malignant neoplasm of unspecified part of unspecified bronchus or lung: Secondary | ICD-10-CM | POA: Diagnosis not present

## 2017-12-30 DIAGNOSIS — K449 Diaphragmatic hernia without obstruction or gangrene: Secondary | ICD-10-CM | POA: Insufficient documentation

## 2017-12-30 DIAGNOSIS — K769 Liver disease, unspecified: Secondary | ICD-10-CM | POA: Diagnosis not present

## 2017-12-30 DIAGNOSIS — J910 Malignant pleural effusion: Secondary | ICD-10-CM | POA: Insufficient documentation

## 2017-12-30 DIAGNOSIS — C7800 Secondary malignant neoplasm of unspecified lung: Secondary | ICD-10-CM | POA: Diagnosis not present

## 2017-12-30 MED ORDER — HEPARIN SOD (PORK) LOCK FLUSH 100 UNIT/ML IV SOLN
500.0000 [IU] | Freq: Once | INTRAVENOUS | Status: AC
  Administered 2017-12-30: 500 [IU] via INTRAVENOUS

## 2017-12-30 MED ORDER — IOHEXOL 300 MG/ML  SOLN
100.0000 mL | Freq: Once | INTRAMUSCULAR | Status: AC | PRN
  Administered 2017-12-30: 100 mL via INTRAVENOUS

## 2017-12-30 MED ORDER — HEPARIN SOD (PORK) LOCK FLUSH 100 UNIT/ML IV SOLN
INTRAVENOUS | Status: AC
  Administered 2017-12-30: 500 [IU] via INTRAVENOUS
  Filled 2017-12-30: qty 5

## 2018-01-04 ENCOUNTER — Encounter: Payer: Self-pay | Admitting: Oncology

## 2018-01-04 ENCOUNTER — Inpatient Hospital Stay: Payer: Medicare Other

## 2018-01-04 ENCOUNTER — Other Ambulatory Visit: Payer: Self-pay | Admitting: Internal Medicine

## 2018-01-04 ENCOUNTER — Inpatient Hospital Stay (HOSPITAL_BASED_OUTPATIENT_CLINIC_OR_DEPARTMENT_OTHER): Payer: Medicare Other | Admitting: Oncology

## 2018-01-04 VITALS — BP 125/76 | HR 135 | Temp 98.1°F | Resp 18 | Ht 68.0 in | Wt 193.7 lb

## 2018-01-04 DIAGNOSIS — R000 Tachycardia, unspecified: Secondary | ICD-10-CM

## 2018-01-04 DIAGNOSIS — Z452 Encounter for adjustment and management of vascular access device: Secondary | ICD-10-CM | POA: Diagnosis not present

## 2018-01-04 DIAGNOSIS — G629 Polyneuropathy, unspecified: Secondary | ICD-10-CM | POA: Diagnosis not present

## 2018-01-04 DIAGNOSIS — Z7189 Other specified counseling: Secondary | ICD-10-CM | POA: Insufficient documentation

## 2018-01-04 DIAGNOSIS — C3491 Malignant neoplasm of unspecified part of right bronchus or lung: Secondary | ICD-10-CM

## 2018-01-04 DIAGNOSIS — E860 Dehydration: Secondary | ICD-10-CM

## 2018-01-04 DIAGNOSIS — C3481 Malignant neoplasm of overlapping sites of right bronchus and lung: Secondary | ICD-10-CM | POA: Diagnosis not present

## 2018-01-04 DIAGNOSIS — E876 Hypokalemia: Secondary | ICD-10-CM

## 2018-01-04 DIAGNOSIS — R112 Nausea with vomiting, unspecified: Secondary | ICD-10-CM

## 2018-01-04 DIAGNOSIS — Z5111 Encounter for antineoplastic chemotherapy: Secondary | ICD-10-CM

## 2018-01-04 LAB — COMPREHENSIVE METABOLIC PANEL
ALK PHOS: 115 U/L (ref 40–150)
ALT: 19 U/L (ref 0–55)
AST: 45 U/L — AB (ref 5–34)
Albumin: 3.4 g/dL — ABNORMAL LOW (ref 3.5–5.0)
Anion gap: 10 (ref 3–11)
BUN: 8 mg/dL (ref 7–26)
CALCIUM: 9.9 mg/dL (ref 8.4–10.4)
CHLORIDE: 101 mmol/L (ref 98–109)
CO2: 26 mmol/L (ref 22–29)
CREATININE: 0.71 mg/dL (ref 0.60–1.10)
GFR calc non Af Amer: >60 mL/min (ref >60)
GLUCOSE: 98 mg/dL (ref 70–140)
Potassium: 3 mmol/L — CL (ref 3.5–5.1)
SODIUM: 137 mmol/L (ref 136–145)
Total Bilirubin: 0.7 mg/dL (ref 0.2–1.2)
Total Protein: 7.9 g/dL (ref 6.4–8.3)

## 2018-01-04 LAB — CBC WITH DIFFERENTIAL/PLATELET
BASOS PCT: 0 %
Basophils Absolute: 0 10*3/uL (ref 0.0–0.1)
EOS ABS: 0 10*3/uL (ref 0.0–0.5)
EOS PCT: 0 %
HCT: 28.9 % — ABNORMAL LOW (ref 34.8–46.6)
Hemoglobin: 9.6 g/dL — ABNORMAL LOW (ref 11.6–15.9)
LYMPHS ABS: 0.7 10*3/uL — AB (ref 0.9–3.3)
Lymphocytes Relative: 15 %
MCH: 32.9 pg (ref 25.1–34.0)
MCHC: 33.3 g/dL (ref 31.5–36.0)
MCV: 98.9 fL (ref 79.5–101.0)
MONO ABS: 0.5 10*3/uL (ref 0.1–0.9)
MONOS PCT: 11 %
Neutro Abs: 3.7 10*3/uL (ref 1.5–6.5)
Neutrophils Relative %: 74 %
Platelets: 113 10*3/uL — ABNORMAL LOW (ref 145–400)
RBC: 2.93 MIL/uL — ABNORMAL LOW (ref 3.70–5.45)
RDW: 21.1 % — AB (ref 11.2–14.5)
WBC: 4.9 10*3/uL (ref 3.9–10.3)

## 2018-01-04 MED ORDER — SODIUM CHLORIDE 0.9% FLUSH
10.0000 mL | INTRAVENOUS | Status: DC | PRN
  Administered 2018-01-04: 10 mL
  Filled 2018-01-04: qty 10

## 2018-01-04 MED ORDER — OXYCODONE HCL 5 MG PO TABS: 5.0000 mg | ORAL_TABLET | Freq: Four times a day (QID) | ORAL | 0 refills | Status: DC | PRN

## 2018-01-04 MED ORDER — ONDANSETRON 8 MG PO TBDP: 8.0000 mg | ORAL_TABLET | Freq: Three times a day (TID) | ORAL | 1 refills | Status: DC | PRN

## 2018-01-04 MED ORDER — LIDOCAINE 5 % EX PTCH: 1.0000 | MEDICATED_PATCH | CUTANEOUS | 0 refills | Status: DC

## 2018-01-04 MED ORDER — HEPARIN SOD (PORK) LOCK FLUSH 100 UNIT/ML IV SOLN
500.0000 [IU] | Freq: Once | INTRAVENOUS | Status: AC | PRN
  Administered 2018-01-04: 500 [IU]
  Filled 2018-01-04: qty 5

## 2018-01-04 MED ORDER — POTASSIUM CHLORIDE CRYS ER 20 MEQ PO TBCR: EXTENDED_RELEASE_TABLET | ORAL | 0 refills | Status: DC

## 2018-01-04 NOTE — Assessment & Plan Note (Signed)
This is a very pleasant 57 year old African-American female with metastatic non-small cell lung cancer that was initially diagnosed as stage IIIA non-small cell lung cancer, squamous cell carcinoma presented with large right hilar mass and mediastinal lymphadenopathy diagnosed in November 2016. She status post concurrent chemoradiation followed by consolidation chemotherapy. The patient was found recently to have intramuscular metastases and the right posterior chest wall that was biopsy-proven to be squamous cell carcinoma with PDL 1 expression 50%. The patient was a started on treatment with immunotherapy with Ketruda (pembrolizumab) status post 3 cycles. Unfortunately imaging studies after the treatment with Keytruda showed evidence for disease progression. The patient is currently undergoing treatment with carboplatin for AUC of 5 and paclitaxel 175 mg/M2 status post 6 cycles.  She tolerate this treatment very well especially after reducing the dose of paclitaxel to 150 mg/M2 starting from cycle #2. She had a recent restaging CT scan of the chest, abdomen, pelvis and is here to discuss the results.  The patient was seen with Dr. Julien Nordmann.  CT scan results were discussed with the patient. Unortunately, the patient has evidence of disease progression.  Treatment options were discussed with the patient including the use of single agent Gemzar versus docetaxel with Cyramza.  Thus that the Taxotere could worsen her peripheral neuropathy and the patient does not want to try this medication at this time.  We discussed use of single agent Gemzar given day 1 and day 8 of a 21-day cycle.  Adverse effects of this medication but discussed with the patient including but not limited to myelosuppression, nausea vomiting, alopecia, and renal dysfunction.  Patient would like to proceed.  Anticipate first dose on 01/12/2018. The patient will follow-up in approximately 4 weeks for evaluation prior to starting cycle  #2. Zofran ODT was refilled for the patient today.  For her pain, she was given a refill of oxycodone.  We will also try her on a Lidoderm patch to her right shoulder to see if this helps.  For the peripheral neuropathy, patient will continue on Neurontin 200 mg p.o. 3 times daily.  The patient's potassium level is low today at 3.0.  She has been taking K-Dur 20 mEq twice a day.  I have instructed her to increase this to 40 mEq in the morning and 20 mEq in the evening.  We will recheck her CMET next week.  The patient was advised to call immediately if she has any concerning symptoms in the interval. The patient voices understanding of current disease status and treatment options and is in agreement with the current care plan. All questions were answered. The patient knows to call the clinic with any problems, questions or concerns. We can certainly see the patient much sooner if necessary.

## 2018-01-04 NOTE — Progress Notes (Signed)
[facility] OFFICE PROGRESS NOTE  [name], PA-C [place] [address]  DIAGNOSIS: Metastatic non-small cell lung cancer initially diagnosed as Stage IIIA (T2b, N2, M0) non-small cell lung cancer, invasive poorly differentiated squamous cell carcinoma diagnosed in November 2016 and presented with large right hilar mass with collapse of the right middle lobe and extension into the right upper lobe and right lower lobe along the hilum with right paratracheal and subcarinal lymphadenopathy. She presented in July 2018 with metastatic intramuscular mass in the right posterior lateral chest wall, with biopsy confirmed squamous cell carcinoma. PDL1 Expression: 50%.  PRIOR THERAPY: 1) Concurrent chemoradiation with weekly carboplatin for AUC of 2 and paclitaxel 45 MG/M2. First dose 08/19/2015. Status post 5 cycles. Last cycle was given 10/07/2015 with significant response. 2) Consolidation chemotherapy with carboplatin for AUC of 5 and paclitaxel 175 MG/M2 every 3 weeks with Neulasta support. First dose 11/18/2015. Status post 3 cycles. 3) palliative radiotherapy to the intramuscular mass in the right posterior lateral chest wall under the care of Dr. Tammi Klippel. 4) Ketruda 200 mg IV every 3 weeks. First dose 05/12/2017. Status post 3 cycles.  This was discontinued secondary to disease progression. 5) Systemic chemotherapy with carboplatin for AUC of 5 and paclitaxel 175 mg/M2 every 3 weeks with Neulasta support.  First dose August 26, 2017.  Status post 6 cycles.   CURRENT THERAPY: Gemzar 1000 mg/m day 1 and day 8 of 21-day cycle.  First dose 01/11/2018.  INTERVAL HISTORY: Theresa [name] 57 y.o. female returns for routine follow-up visit by herself.  Patient is feeling fine today and has no specific complaints except for ongoing pain to her right shoulder area and ongoing peripheral neuropathy in her feet.  She uses oxycodone for pain as needed.  She is on gabapentin 200 mg  3 times a day for the neuropathy.  The patient denies fevers and chills.  Denies chest pain, shortness of breath, cough, hemoptysis.  Denies nausea, vomiting, constipation, diarrhea.  She tolerated her last cycle of treatment fairly well.  She had a staging CT scan is here to discuss the results.  MEDICAL HISTORY: Past Medical History:  Diagnosis Date  . Cancer ([facility])    5 years ago - cervical cancer  . Encounter for antineoplastic chemotherapy 08/27/2015  . History of radiation therapy 08/19/2015 - 10/07/2015   Site/dose:   The patient's primary tumor and involved lymph nodes were treated to 66 Gy in 30 fractions.  . Hypokalemia 09/30/2015  . Non-small cell carcinoma of lung, stage 3 ([facility]) 08/08/2015  . Paroxysmal atrial flutter ([facility]) 09/16/2015  . Pneumonia     ALLERGIES:  has No Known Allergies.  MEDICATIONS:  Current Outpatient Medications  Medication Sig Dispense Refill  . benzonatate (TESSALON) 100 MG capsule Take 1 capsule (100 mg total) by mouth 2 (two) times daily as needed for cough. (Patient not taking: Reported on 12/07/2017) 20 capsule 0  . diphenoxylate-atropine (LOMOTIL) 2.5-0.025 MG tablet Take 2 tablets by mouth 4 (four) times daily as needed for diarrhea or loose stools. (Patient not taking: Reported on 09/15/2017) 60 tablet 1  . gabapentin (NEURONTIN) 100 MG capsule Take 3 capsules (300 mg total) by mouth 3 (three) times daily. 270 capsule 0  . ibuprofen (ADVIL,MOTRIN) 800 MG tablet Take 1 tablet by mouth every 8 (eight) hours as needed.    . lactulose (CHRONULAC) 10 GM/15ML solution Take 15 mLs (10 g total) by mouth 3 (three) times daily as needed for mild  constipation. (Patient not taking: Reported on 09/02/2017) 240 mL 3  . lidocaine (LIDODERM) 5 % Place 1 patch onto the skin daily. Remove & Discard patch within 12 hours or as directed by MD 30 patch 0  . lidocaine-prilocaine (EMLA) cream Apply 1 application topically as needed. 30 g 2  . LORazepam (ATIVAN) 0.5 MG tablet  0.5 mg sublingual p.o. every 6 hours as needed for nausea and/or vomiting. (Patient not taking: Reported on 10/15/2017) 40 tablet 0  . metoprolol tartrate (LOPRESSOR) 25 MG tablet Take 0.5 tablets (12.5 mg total) by mouth 2 (two) times daily. 30 tablet 0  . Multiple Vitamin (MULTIVITAMIN) capsule Take 1 capsule daily by mouth. 30 capsule 2  . ondansetron (ZOFRAN-ODT) 8 MG disintegrating tablet Take 1 tablet (8 mg total) by mouth every 8 (eight) hours as needed for nausea or vomiting. 20 tablet 1  . oxyCODONE (OXY IR/ROXICODONE) 5 MG immediate release tablet Take 1 tablet (5 mg total) by mouth every 6 (six) hours as needed for severe pain. 60 tablet 0  . potassium chloride SA (K-DUR,KLOR-CON) 20 MEQ tablet Take 2 tabs every morning and 1 tab every evening. 90 tablet 0  . prochlorperazine (COMPAZINE) 10 MG tablet Take 1 tablet (10 mg total) by mouth every 6 (six) hours as needed for nausea or vomiting. (Patient not taking: Reported on 12/07/2017) 30 tablet 1   No current facility-administered medications for this visit.    Facility-Administered Medications Ordered in Other Visits  Medication Dose Route Frequency Provider Last Rate Last Dose  . 0.9 %  sodium chloride infusion   Intravenous Continuous Curcio, Kristin R, NP      . 0.9 %  sodium chloride infusion   Intravenous Continuous Curt Bears, MD   Stopped at 05/31/17 1714  . ondansetron (ZOFRAN) injection 8 mg  8 mg Intravenous Once Curt Bears, MD        SURGICAL HISTORY:  Past Surgical History:  Procedure Laterality Date  . IR FLUORO GUIDE PORT INSERTION RIGHT  05/21/2017  . IR US GUIDE VASC ACCESS RIGHT  05/21/2017  . TUBAL LIGATION    . VIDEO BRONCHOSCOPY WITH ENDOBRONCHIAL ULTRASOUND N/A 08/02/2015   Procedure: VIDEO BRONCHOSCOPY WITH ENDOBRONCHIAL ULTRASOUND;  Surgeon: [name], MD;  Location: [facility];  Service: Thoracic;  Laterality: N/A;  . WISDOM TOOTH EXTRACTION      REVIEW OF SYSTEMS:   Review of Systems   Constitutional: Negative for appetite change, chills, fatigue, fever and unexpected weight change.  HENT:   Negative for mouth sores, nosebleeds, sore throat and trouble swallowing.   Eyes: Negative for eye problems and icterus.  Respiratory: Negative for cough, hemoptysis, shortness of breath and wheezing.   Cardiovascular: Negative for chest pain and leg swelling.  Gastrointestinal: Negative for abdominal pain, constipation, diarrhea, nausea and vomiting.  Genitourinary: Negative for bladder incontinence, difficulty urinating, dysuria, frequency and hematuria.   Musculoskeletal: Positive for right shoulder pain.  Skin: Negative for itching and rash.  Neurological: Negative for dizziness, extremity weakness, gait problem, headaches, light-headedness and seizures. Positive for peripheral neuropathy in her feet. Hematological: Negative for adenopathy. Does not bruise/bleed easily.  Psychiatric/Behavioral: Negative for confusion, depression and sleep disturbance. The patient is not nervous/anxious.     PHYSICAL EXAMINATION:  Blood pressure 125/76, pulse (!) 135, temperature 98.1 F (36.7 C), temperature source Oral, resp. rate 18, height 5' 8" (1.727 m), weight 193 lb 11.2 oz (87.9 kg), SpO2 100 %.  ECOG PERFORMANCE STATUS: 1 - Symptomatic but completely ambulatory  Physical Exam  Constitutional: Oriented to person, place, and time and well-developed, well-nourished, and in no distress. No distress.  HENT:  Head: Normocephalic and atraumatic.  Mouth/Throat: Oropharynx is clear and moist. No oropharyngeal exudate.  Eyes: Conjunctivae are normal. Right eye exhibits no discharge. Left eye exhibits no discharge. No scleral icterus.  Neck: Normal range of motion. Neck supple.  Cardiovascular: Normal rate, regular rhythm, normal heart sounds and intact distal pulses.   Pulmonary/Chest: Effort normal. No respiratory distress. No wheezes. No rales. Diminished breath sounds to the right mid lung  to the base. Abdominal: Soft. Bowel sounds are normal. Exhibits no distension and no mass. There is no tenderness.  Musculoskeletal: Normal range of motion. Exhibits no edema.  Lymphadenopathy:    No cervical adenopathy.  Neurological: Alert and oriented to person, place, and time. Exhibits normal muscle tone. Gait normal. Coordination normal.  Skin: Skin is warm and dry. No rash noted. Not diaphoretic. No erythema. No pallor.  Psychiatric: Mood, memory and judgment normal.  Vitals reviewed.  LABORATORY DATA: Lab Results  Component Value Date   WBC 4.9 01/04/2018   HGB 9.6 (L) 01/04/2018   HCT 28.9 (L) 01/04/2018   MCV 98.9 01/04/2018   PLT 113 (L) 01/04/2018      Chemistry      Component Value Date/Time   NA 137 01/04/2018 1115   NA 138 09/24/2017 1600   K 3.0 (LL) 01/04/2018 1115   K 3.4 (L) 09/24/2017 1600   CL 101 01/04/2018 1115   CO2 26 01/04/2018 1115   CO2 25 09/24/2017 1600   BUN 8 01/04/2018 1115   BUN 9.5 09/24/2017 1600   CREATININE 0.71 01/04/2018 1115   CREATININE 0.75 11/26/2017 1424   CREATININE 0.8 09/24/2017 1600      Component Value Date/Time   CALCIUM 9.9 01/04/2018 1115   CALCIUM 9.0 09/24/2017 1600   ALKPHOS 115 01/04/2018 1115   ALKPHOS 92 09/24/2017 1600   AST 45 (H) 01/04/2018 1115   AST 31 11/26/2017 1424   AST 31 09/24/2017 1600   ALT 19 01/04/2018 1115   ALT 25 11/26/2017 1424   ALT 24 09/24/2017 1600   BILITOT 0.7 01/04/2018 1115   BILITOT 0.7 11/26/2017 1424   BILITOT 0.40 09/24/2017 1600       RADIOGRAPHIC STUDIES:  Ct Chest W Contrast  Result Date: 12/30/2017 CLINICAL DATA:  Restaging of metastatic non-small cell lung cancer EXAM: CT CHEST, ABDOMEN, AND PELVIS WITH CONTRAST TECHNIQUE: Multidetector CT imaging of the chest, abdomen and pelvis was performed following the standard protocol during bolus administration of intravenous contrast. CONTRAST:  158m OMNIPAQUE IOHEXOL 300 MG/ML  SOLN COMPARISON:  Multiple exams, including  10/25/2017 FINDINGS: CT CHEST FINDINGS Cardiovascular: Right Port-A-Cath tip: Right atrium. Aberrant right subclavian artery passes behind the esophagus. Mediastinum/Nodes: Small type 1 hiatal hernia. Lungs/Pleura: Large right pleural effusion with increasing pleural mass like irregularity including a 2.0 by 2.5 cm right apical mass not appreciable previously. The large right chest wall mass is also invading into the pleural space through the right eighth and ninth intercostal spaces. Aside from the passive atelectasis there is right perihilar airspace opacity likely reflecting prior radiation pneumonitis, and some right perihilar atelectasis. This is similar to the prior exam. There is some indistinct airspace opacity medially in the posterior basal segment left lower lobe. The previous solid nodular component of this process has resolved. Musculoskeletal: The large right posterior chest wall mass primarily involving the latissimus dorsi and also now extending into  the right serratus anterior has further enlarged. Mass measures 10.2 by 5.5 cm on image 38/2, previously 9.7 by 4.8 cm. Increased intercostal extension through the eighth intercostal space in addition to the ninth intercostal space. The component medially along the scapula and serratus anterior/teres major measures 3.5 cm along its short axis, previously 2.0 cm. Bony destruction along the right ninth rib noted, fairly similar to prior. CT ABDOMEN PELVIS FINDINGS Hepatobiliary: Pattern and appearance of the small hypodense liver lesions unchanged. Gallbladder unremarkable. Pancreas: Unremarkable Spleen: Unremarkable Adrenals/Urinary Tract: Diffuse thickening of the urinary bladder wall. The adrenal glands appear unremarkable. Stomach/Bowel: Unremarkable Vascular/Lymphatic: Aortoiliac atherosclerotic vascular disease. Reproductive: Fatty mass along the right adnexa, stable, possibly a dermoid. This measures 5.8 cm in long axis today, unchanged. Other: No  supplemental non-categorized findings. Musculoskeletal: Unremarkable IMPRESSION: 1. Progressive enlargement of the right posterior chest wall mass, now invading the pleural space through the eighth and ninth intercostal spaces. New pleural nodularity including a 2.5 by 2.0 cm right apical mass along the pleural effusion margin. Large malignant right pleural effusion. Stable appearance of early destruction of the right ninth rib inferiorly. 2. No findings of active malignancy in the abdomen or pelvis. 3. Improved but not resolved airspace opacity medially in the left lower lobe. The nodular component shown previously is no longer present. 4. Diffuse wall thickening of the urinary bladder wall suspicious for cystitis. 5. 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 Small type 1 hiatal hernia. Hypodense liver lesions are likely cysts. Right ovarian dermoid. Electronically Signed   By: Van Clines M.D.   On: 12/30/2017 14:59   Ct Abdomen Pelvis W Contrast  Result Date: 12/30/2017 CLINICAL DATA:  Restaging of metastatic non-small cell lung cancer EXAM: CT CHEST, ABDOMEN, AND PELVIS WITH CONTRAST TECHNIQUE: Multidetector CT imaging of the chest, abdomen and pelvis was performed following the standard protocol during bolus administration of intravenous contrast. CONTRAST:  17m OMNIPAQUE IOHEXOL 300 MG/ML  SOLN COMPARISON:  Multiple exams, including 10/25/2017 FINDINGS: CT CHEST FINDINGS Cardiovascular: Right Port-A-Cath tip: Right atrium. Aberrant right subclavian artery passes behind the esophagus. Mediastinum/Nodes: Small type 1 hiatal hernia. Lungs/Pleura: Large right pleural effusion with increasing pleural mass like irregularity including a 2.0 by 2.5 cm right apical mass not appreciable previously. The large right chest wall mass is also invading into the pleural space through the right eighth and ninth intercostal  spaces. Aside from the passive atelectasis there is right perihilar airspace opacity likely reflecting prior radiation pneumonitis, and some right perihilar atelectasis. This is similar to the prior exam. There is some indistinct airspace opacity medially in the posterior basal segment left lower lobe. The previous solid nodular component of this process has resolved. Musculoskeletal: The large right posterior chest wall mass primarily involving the latissimus dorsi and also now extending into the right serratus anterior has further enlarged. Mass measures 10.2 by 5.5 cm on image 38/2, previously 9.7 by 4.8 cm. Increased intercostal extension through the eighth intercostal space in addition to the ninth intercostal space. The component medially along the scapula and serratus anterior/teres major measures 3.5 cm along its short axis, previously 2.0 cm. Bony destruction along the right ninth rib noted, fairly similar to prior. CT ABDOMEN PELVIS FINDINGS Hepatobiliary: Pattern and appearance of the small hypodense liver lesions unchanged. Gallbladder unremarkable. Pancreas: Unremarkable Spleen: Unremarkable Adrenals/Urinary Tract: Diffuse thickening of the urinary bladder wall. The adrenal glands appear unremarkable. Stomach/Bowel: Unremarkable Vascular/Lymphatic: Aortoiliac atherosclerotic vascular disease.  Reproductive: Fatty mass along the right adnexa, stable, possibly a dermoid. This measures 5.8 cm in long axis today, unchanged. Other: No supplemental non-categorized findings. Musculoskeletal: Unremarkable IMPRESSION: 1. Progressive enlargement of the right posterior chest wall mass, now invading the pleural space through the eighth and ninth intercostal spaces. New pleural nodularity including a 2.5 by 2.0 cm right apical mass along the pleural effusion margin. Large malignant right pleural effusion. Stable appearance of early destruction of the right ninth rib inferiorly. 2. No findings of active malignancy in  the abdomen or pelvis. 3. Improved but not resolved airspace opacity medially in the left lower lobe. The nodular component shown previously is no longer present. 4. Diffuse wall thickening of the urinary bladder wall suspicious for cystitis. 5. 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 Small type 1 hiatal hernia. Hypodense liver lesions are likely cysts. Right ovarian dermoid. Electronically Signed   By: Van Clines M.D.   On: 12/30/2017 14:59     ASSESSMENT/PLAN:  Metastatic primary lung cancer, right [facility]) This is a very pleasant 57 year old African-American female with metastatic non-small cell lung cancer that was initially diagnosed as stage IIIA non-small cell lung cancer, squamous cell carcinoma presented with large right hilar mass and mediastinal lymphadenopathy diagnosed in November 2016. She status post concurrent chemoradiation followed by consolidation chemotherapy. The patient was found recently to have intramuscular metastases and the right posterior chest wall that was biopsy-proven to be squamous cell carcinoma with PDL 1 expression 50%. The patient was a started on treatment with immunotherapy with Ketruda (pembrolizumab) status post 3 cycles. Unfortunately imaging studies after the treatment with Keytruda showed evidence for disease progression. The patient is currently undergoing treatment with carboplatin for AUC of 5 and paclitaxel 175 mg/M2 status post 6 cycles.  She  tolerate this treatment very well especially after reducing the dose of paclitaxel to 150 mg/M2 starting from cycle #2. She had a recent restaging CT scan of the chest, abdomen, pelvis and is here to discuss the results.  The patient was seen with Dr. Julien Nordmann.  CT scan results were discussed with the patient. Unortunately, the patient has evidence of disease progression.  Treatment options were discussed with the patient  including the use of single agent Gemzar versus docetaxel with Cyramza.  Thus that the Taxotere could worsen her peripheral neuropathy and the patient does not want to try this medication at this time.  We discussed use of single agent Gemzar given day 1 and day 8 of a 21-day cycle.  Adverse effects of this medication but discussed with the patient including but not limited to myelosuppression, nausea vomiting, alopecia, and renal dysfunction.  Patient would like to proceed.  Anticipate first dose on 01/12/2018. The patient will follow-up in approximately 4 weeks for evaluation prior to starting cycle #2. Zofran ODT was refilled for the patient today.  For her pain, she was given a refill of oxycodone.  We will also try her on a Lidoderm patch to her right shoulder to see if this helps.  For the peripheral neuropathy, patient will continue on Neurontin 200 mg p.o. 3 times daily.  The patient's potassium level is low today at 3.0.  She has been taking K-Dur 20 mEq twice a day.  I have instructed her to increase this to 40 mEq in the morning and 20 mEq in the evening.  We will recheck her CMET next week.  The patient was advised  to call immediately if she has any concerning symptoms in the interval. The patient voices understanding of current disease status and treatment options and is in agreement with the current care plan. All questions were answered. The patient knows to call the clinic with any problems, questions or concerns. We can certainly see the patient much sooner if necessary.   Orders Placed This Encounter  Procedures  . CBC with Differential (Cancer Center Only)    Standing Status:   Standing    Number of Occurrences:   20    Standing Expiration Date:   01/05/2019  . CMP ([facility] only)    Standing Status:   Standing    Number of Occurrences:   20    Standing Expiration Date:   01/05/2019   Mikey Bussing, DNP, AGPCNP-BC, AOCNP 01/04/18  ADDENDUM: Hematology/Oncology  Attending: I had a face-to-face encounter with the patient.  They recommended her care plan.  This is a very pleasant 57 years old African-American female with metastatic non-small cell lung cancer, squamous cell carcinoma status post several chemotherapy regimens.  She was treated recently with a course of carboplatin and paclitaxel for 6 cycles.  She tolerated the treatment well except for the peripheral neuropathy. The patient had a repeat CT scan of the chest, abdomen and pelvis performed recently.  I personally and independently reviewed the scans and discussed the results with the patient today. Unfortunately had a scan showed evidence for disease progression. I discussed with the patient other treatment options including palliative care versus systemic chemotherapy with docetaxel and Cyramza versus treatment with single agent gemcitabine.  The patient has significant peripheral neuropathy and she is concerned about the treatment with docetaxel and Cyramza but she is agreeable to treatment with gemcitabine. I will arrange for her to start treatment with single agent gemcitabine 1000 mg/M2 on days 1 and 8 every 3 weeks.  She is expected to start the first cycle of this treatment next week. The patient will come back for follow-up visit in 4 weeks for evaluation with the start of cycle #2. For hypokalemia, we will start the patient on potassium chloride. For pain management, she was given refill of oxycodone. The patient was advised to call immediately if she has any concerning symptoms in the interval.  Disclaimer: This note was dictated with voice recognition software. Similar sounding words can inadvertently be transcribed and may be missed upon review. Theresa Kempf, MD 01/06/18

## 2018-01-04 NOTE — Patient Instructions (Signed)

## 2018-01-04 NOTE — Progress Notes (Signed)
DISCONTINUE ON PATHWAY REGIMEN - Non-Small Cell Lung     A cycle is every 21 days:     Paclitaxel      Carboplatin   **Always confirm dose/schedule in your pharmacy ordering system**    REASON: Disease Progression PRIOR TREATMENT: [id number]: Carboplatin + Paclitaxel q21 Days x 4 Cycles TREATMENT RESPONSE: Progressive Disease (PD)  Non-Small Cell Lung - No Medical Intervention - Off Treatment.  Patient Characteristics: Stage IV Metastatic, Squamous, PS = 0, 1, Third Line, Prior PD-1/PD-L1 Inhibitor or No Prior PD-1/PD-L1 Inhibitor and Not a Candidate for Immunotherapy AJCC T Category: T2b Current Disease Status: Distant Metastases AJCC N Category: N2 AJCC M Category: M1c AJCC 8 Stage Grouping: IVB Histology: Squamous Cell Line of therapy: Third Line PD-L1 Expression Status: PD-L1 Positive ? 50% (TPS) Performance Status: PS = 0, 1 Immunotherapy Candidate Status: Not a Candidate for Immunotherapy Prior Immunotherapy Status: Prior PD-1/PD-L1 Inhibitor

## 2018-01-05 ENCOUNTER — Other Ambulatory Visit: Payer: Self-pay | Admitting: Oncology

## 2018-01-05 ENCOUNTER — Telehealth: Payer: Self-pay | Admitting: *Deleted

## 2018-01-05 DIAGNOSIS — J90 Pleural effusion, not elsewhere classified: Secondary | ICD-10-CM

## 2018-01-05 DIAGNOSIS — C3491 Malignant neoplasm of unspecified part of right bronchus or lung: Secondary | ICD-10-CM

## 2018-01-05 NOTE — Telephone Encounter (Signed)
"  It's getting late so I want to check on appointment to get this fluid off my lungs.  He saw it yesterday.  I now can feel changes, today's a new day.  No need to go the ED today,just want to know." Noted active requests for thoracentesis referral needs scheduling.  Patient reports she does have Radiology Central Scheduling number.

## 2018-01-05 NOTE — Telephone Encounter (Signed)
Patient called stating she wanted to make Erasmo Downer, NP aware she would like to have the thoracentesis done.   Per Erasmo Downer order placed. Thoracentesis scheduled 01/07/18 at 1100 am, left a message making patient aware of appointment date and time.

## 2018-01-06 ENCOUNTER — Encounter: Payer: Self-pay | Admitting: Urgent Care

## 2018-01-06 ENCOUNTER — Ambulatory Visit (INDEPENDENT_AMBULATORY_CARE_PROVIDER_SITE_OTHER): Payer: Medicare Other | Admitting: Urgent Care

## 2018-01-06 VITALS — BP 107/69 | HR 140 | Temp 98.4°F | Resp 18 | Ht 68.0 in | Wt 193.0 lb

## 2018-01-06 DIAGNOSIS — R9431 Abnormal electrocardiogram [ECG] [EKG]: Secondary | ICD-10-CM

## 2018-01-06 DIAGNOSIS — G609 Hereditary and idiopathic neuropathy, unspecified: Secondary | ICD-10-CM

## 2018-01-06 DIAGNOSIS — Z8679 Personal history of other diseases of the circulatory system: Secondary | ICD-10-CM | POA: Diagnosis not present

## 2018-01-06 DIAGNOSIS — E876 Hypokalemia: Secondary | ICD-10-CM | POA: Diagnosis not present

## 2018-01-06 DIAGNOSIS — C3491 Malignant neoplasm of unspecified part of right bronchus or lung: Secondary | ICD-10-CM | POA: Diagnosis not present

## 2018-01-06 DIAGNOSIS — R000 Tachycardia, unspecified: Secondary | ICD-10-CM

## 2018-01-06 MED ORDER — GABAPENTIN 300 MG PO CAPS
300.0000 mg | ORAL_CAPSULE | Freq: Three times a day (TID) | ORAL | 5 refills | Status: DC
Start: 2018-01-06 — End: 2018-01-20

## 2018-01-06 MED ORDER — METOPROLOL TARTRATE 25 MG PO TABS: 12.5000 mg | ORAL_TABLET | Freq: Two times a day (BID) | ORAL | 1 refills | Status: AC

## 2018-01-06 NOTE — Patient Instructions (Addendum)
Hypokalemia Hypokalemia means that the amount of potassium in the blood is lower than normal.Potassium is a chemical that helps regulate the amount of fluid in the body (electrolyte). It also stimulates muscle tightening (contraction) and helps nerves work properly.Normally, most of the body's potassium is inside of cells, and only a very small amount is in the blood. Because the amount in the blood is so small, minor changes to potassium levels in the blood can be life-threatening. What are the causes? This condition may be caused by:  Antibiotic medicine.  Diarrhea or vomiting. Taking too much of a medicine that helps you have a bowel movement (laxative) can cause diarrhea and lead to hypokalemia.  Chronic kidney disease (CKD).  Medicines that help the body get rid of excess fluid (diuretics).  Eating disorders, such as bulimia.  Low magnesium levels in the body.  Sweating a lot.  What are the signs or symptoms? Symptoms of this condition include:  Weakness.  Constipation.  Fatigue.  Muscle cramps.  Mental confusion.  Skipped heartbeats or irregular heartbeat (palpitations).  Tingling or numbness.  How is this diagnosed? This condition is diagnosed with a blood test. How is this treated? Hypokalemia can be treated by taking potassium supplements by mouth or adjusting the medicines that you take. Treatment may also include eating more foods that contain a lot of potassium. If your potassium level is very low, you may need to get potassium through an IV tube in one of your veins and be monitored in the hospital. Follow these instructions at home:  Take over-the-counter and prescription medicines only as told by your health care provider. This includes vitamins and supplements.  Eat a healthy diet. A healthy diet includes fresh fruits and vegetables, whole grains, healthy fats, and lean proteins.  If instructed, eat more foods that contain a lot of potassium, such  as: ? Nuts, such as peanuts and pistachios. ? Seeds, such as sunflower seeds and pumpkin seeds. ? Peas, lentils, and lima beans. ? Whole grain and bran cereals and breads. ? Fresh fruits and vegetables, such as apricots, avocado, bananas, cantaloupe, kiwi, oranges, tomatoes, asparagus, and potatoes. ? Orange juice. ? Tomato juice. ? Red meats. ? Yogurt.  Keep all follow-up visits as told by your health care provider. This is important. Contact a health care provider if:  You have weakness that gets worse.  You feel your heart pounding or racing.  You vomit.  You have diarrhea.  You have diabetes (diabetes mellitus) and you have trouble keeping your blood sugar (glucose) in your target range. Get help right away if:  You have chest pain.  You have shortness of breath.  You have vomiting or diarrhea that lasts for more than 2 days.  You faint. This information is not intended to replace advice given to you by your health care provider. Make sure you discuss any questions you have with your health care provider. Document Released: 09/07/2005 Document Revised: 04/25/2016 Document Reviewed: 04/25/2016 Elsevier Interactive Patient Education  2018 [employer].     IF you received an x-ray today, you will receive an invoice from [facility]. Please contact [facility] at [phone] with questions or concerns regarding your invoice.   IF you received labwork today, you will receive an invoice from [facility]. Please contact [facility] at [phone] with questions or concerns regarding your invoice.   Our billing staff will not be able to assist you with questions regarding bills from these companies.  You will be contacted with  the lab results as soon as they are available. The fastest way to get your results is to activate your My Chart account. Instructions are located on the last page of this paperwork. If you have not heard from [place] regarding the results in  2 weeks, please contact this office.

## 2018-01-06 NOTE — Progress Notes (Signed)
    MRN: [id number] DOB: 09-03-1961  Subjective:   Theresa [name] is a 57 y.o. female presenting for refill of her metoprolol.  Patient states that she has worked with her oncologist, is having side effects of her chemotherapy including elevated heart rate.  States that they have done EKGs and recommend that she get started back up with metoprolol.  Patient denies having dizziness, chest pain, heart racing, palpitations.  She does endorse having intermittent nausea from her chemo treatments.  She did see her oncologist recently and was prescribed potassium supplement as well.  Patient reports that she did not fill her medicines because they were too costly.  She is very concerned that she does not have gabapentin which she takes for neuropathy of her feet.  Eiley has a current medication list which includes the following prescription(s): benzonatate, diphenoxylate-atropine, gabapentin, ibuprofen, lactulose, lidocaine, lidocaine-prilocaine, lorazepam, metoprolol tartrate, multivitamin, ondansetron, oxycodone, potassium chloride sa, and prochlorperazine, and the following Facility-Administered Medications: sodium chloride, sodium chloride, and ondansetron. Also has No Known Allergies.  Veralyn  has a past medical history of Cancer [facility]), Encounter for antineoplastic chemotherapy (08/27/2015), History of radiation therapy (08/19/2015 - 10/07/2015), Hypokalemia (09/30/2015), Non-small cell carcinoma of lung, stage 3 ([place]) (08/08/2015), Paroxysmal atrial flutter ([place]) (09/16/2015), and Pneumonia. Also  has a past surgical history that includes Wisdom tooth extraction; Tubal ligation; Video bronchoscopy with endobronchial ultrasound (N/A, 08/02/2015); IR US Guide Vasc Access Right (05/21/2017); and IR FLUORO GUIDE PORT INSERTION RIGHT (05/21/2017).  Objective:   Vitals: BP 107/69   Pulse (!) 140   Temp 98.4 F (36.9 C) (Oral)   Resp 18   Ht 5\' 8"  (1.727 m)   Wt 193 lb (87.5 kg)   SpO2 96%   BMI 29.35 kg/m     Physical Exam  Constitutional: She is oriented to person, place, and time. She appears well-developed and well-nourished.  Eyes: No scleral icterus.  Cardiovascular: Normal rate, regular rhythm and intact distal pulses. Exam reveals no gallop and no friction rub.  No murmur heard. Pulmonary/Chest: Effort normal. No respiratory distress. She has no wheezes. She has no rales.  Neurological: She is alert and oriented to person, place, and time.  Skin: Skin is warm and dry.  Psychiatric: She has a normal mood and affect.   Assessment and Plan :   Tachycardia - Plan: Ambulatory referral to Cardiology  Nonspecific abnormal electrocardiogram (ECG) (EKG) - Plan: Ambulatory referral to Cardiology  History of atrial flutter - Plan: Ambulatory referral to Cardiology  Metastatic primary lung cancer, right ([place])  Idiopathic peripheral neuropathy - Plan: gabapentin (NEURONTIN) 300 MG capsule  Hypokalemia  I refilled patient's metoprolol.  Placed the referral as well to have a consult with a cardiologist given that her EKGs suggest right bundle branch block.  She has a history of atrial flutter and would be reasonable for her to have a recheck with a cardiologist.  I work with patient to find discount coupons for her gabapentin.  I did change her prescription to 300 mg capsules 1-3 times daily.  We also found discount coupons for Zofran which the patient will try at 1/2 tablet as needed for her nausea.  Follow-up after visit with cardiologist.  [name], PA-C [facility] [phone] 01/06/2018  1:41 PM

## 2018-01-07 ENCOUNTER — Ambulatory Visit (HOSPITAL_COMMUNITY)
Admission: RE | Admit: 2018-01-07 | Discharge: 2018-01-07 | Disposition: A | Discharge status: Home / Self Care | Payer: Medicare Other | Source: Ambulatory Visit | Attending: Oncology | Admitting: Oncology

## 2018-01-07 ENCOUNTER — Ambulatory Visit (HOSPITAL_COMMUNITY)
Admission: RE | Admit: 2018-01-07 | Discharge: 2018-01-07 | Disposition: A | Discharge status: Home / Self Care | Payer: Medicare Other | Source: Ambulatory Visit | Attending: Radiology | Admitting: Radiology

## 2018-01-07 DIAGNOSIS — C3491 Malignant neoplasm of unspecified part of right bronchus or lung: Secondary | ICD-10-CM | POA: Diagnosis not present

## 2018-01-07 DIAGNOSIS — Z9889 Other specified postprocedural states: Secondary | ICD-10-CM | POA: Diagnosis not present

## 2018-01-07 DIAGNOSIS — R0602 Shortness of breath: Secondary | ICD-10-CM | POA: Diagnosis not present

## 2018-01-07 DIAGNOSIS — J90 Pleural effusion, not elsewhere classified: Secondary | ICD-10-CM | POA: Diagnosis not present

## 2018-01-07 MED ORDER — LIDOCAINE HCL 1 % IJ SOLN
INTRAMUSCULAR | Status: AC
  Filled 2018-01-07: qty 10

## 2018-01-07 NOTE — Procedures (Signed)
Ultrasound-guided therapeutic right thoracentesis performed yielding 1.3 liters of yellow fluid. No immediate complications. Follow-up chest x-ray pending. Due to pt coughing/chest discomfort only the above amount of fluid was removed today.

## 2018-01-10 ENCOUNTER — Telehealth: Payer: Self-pay | Admitting: Internal Medicine

## 2018-01-10 NOTE — Telephone Encounter (Signed)
Spoke to patient regarding voicemail left earlier about time and date of upcoming appointments.

## 2018-01-11 ENCOUNTER — Other Ambulatory Visit: Payer: Medicare Other

## 2018-01-12 ENCOUNTER — Inpatient Hospital Stay: Payer: Medicare Other

## 2018-01-12 VITALS — BP 133/79 | HR 124 | Temp 98.4°F | Resp 18

## 2018-01-12 DIAGNOSIS — Z452 Encounter for adjustment and management of vascular access device: Secondary | ICD-10-CM | POA: Diagnosis not present

## 2018-01-12 DIAGNOSIS — C3491 Malignant neoplasm of unspecified part of right bronchus or lung: Secondary | ICD-10-CM

## 2018-01-12 DIAGNOSIS — E860 Dehydration: Secondary | ICD-10-CM

## 2018-01-12 DIAGNOSIS — G629 Polyneuropathy, unspecified: Secondary | ICD-10-CM | POA: Diagnosis not present

## 2018-01-12 DIAGNOSIS — E876 Hypokalemia: Secondary | ICD-10-CM | POA: Diagnosis not present

## 2018-01-12 DIAGNOSIS — C3481 Malignant neoplasm of overlapping sites of right bronchus and lung: Secondary | ICD-10-CM | POA: Diagnosis not present

## 2018-01-12 LAB — CBC WITH DIFFERENTIAL (CANCER CENTER ONLY)
BASOS PCT: 1 %
Basophils Absolute: 0 10*3/uL (ref 0.0–0.1)
Eosinophils Absolute: 0 10*3/uL (ref 0.0–0.5)
Eosinophils Relative: 0 %
HEMATOCRIT: 28.5 % — AB (ref 34.8–46.6)
HEMOGLOBIN: 9.6 g/dL — AB (ref 11.6–15.9)
LYMPHS ABS: 0.6 10*3/uL — AB (ref 0.9–3.3)
Lymphocytes Relative: 13 %
MCH: 33.3 pg (ref 25.1–34.0)
MCHC: 33.7 g/dL (ref 31.5–36.0)
MCV: 98.9 fL (ref 79.5–101.0)
MONO ABS: 0.4 10*3/uL (ref 0.1–0.9)
MONOS PCT: 8 %
NEUTROS ABS: 3.7 10*3/uL (ref 1.5–6.5)
Neutrophils Relative %: 78 %
Platelet Count: 130 10*3/uL — ABNORMAL LOW (ref 145–400)
RBC: 2.88 MIL/uL — ABNORMAL LOW (ref 3.70–5.45)
RDW: 21 % — AB (ref 11.2–14.5)
WBC Count: 4.6 10*3/uL (ref 3.9–10.3)

## 2018-01-12 LAB — CMP (CANCER CENTER ONLY)
ALK PHOS: 115 U/L (ref 40–150)
ALT: 34 U/L (ref 0–55)
ANION GAP: 9 (ref 3–11)
AST: 51 U/L — ABNORMAL HIGH (ref 5–34)
Albumin: 3.2 g/dL — ABNORMAL LOW (ref 3.5–5.0)
BUN: 6 mg/dL — ABNORMAL LOW (ref 7–26)
CALCIUM: 9.7 mg/dL (ref 8.4–10.4)
CHLORIDE: 103 mmol/L (ref 98–109)
CO2: 27 mmol/L (ref 22–29)
CREATININE: 0.8 mg/dL (ref 0.60–1.10)
GFR, Est AFR Am: >60 mL/min (ref >60)
GFR, Estimated: >60 mL/min (ref >60)
Glucose, Bld: 125 mg/dL (ref 70–140)
Potassium: 3.1 mmol/L — ABNORMAL LOW (ref 3.5–5.1)
SODIUM: 139 mmol/L (ref 136–145)
Total Bilirubin: 0.6 mg/dL (ref 0.2–1.2)
Total Protein: 7.5 g/dL (ref 6.4–8.3)

## 2018-01-12 MED ORDER — PROCHLORPERAZINE MALEATE 10 MG PO TABS
ORAL_TABLET | ORAL | Status: AC
  Filled 2018-01-12: qty 1

## 2018-01-12 MED ORDER — SODIUM CHLORIDE 0.9 % IV SOLN
Freq: Once | INTRAVENOUS | Status: AC
  Administered 2018-01-12: 15:00:00 via INTRAVENOUS

## 2018-01-12 MED ORDER — HEPARIN SOD (PORK) LOCK FLUSH 100 UNIT/ML IV SOLN
500.0000 [IU] | Freq: Once | INTRAVENOUS | Status: AC | PRN
  Administered 2018-01-12: 500 [IU]
  Filled 2018-01-12: qty 5

## 2018-01-12 MED ORDER — SODIUM CHLORIDE 0.9% FLUSH
10.0000 mL | INTRAVENOUS | Status: DC | PRN
  Administered 2018-01-12: 10 mL
  Filled 2018-01-12: qty 10

## 2018-01-12 MED ORDER — SODIUM CHLORIDE 0.9 % IV SOLN
2000.0000 mg | Freq: Once | INTRAVENOUS | Status: AC
  Administered 2018-01-12: 2000 mg via INTRAVENOUS
  Filled 2018-01-12: qty 53

## 2018-01-12 MED ORDER — PROCHLORPERAZINE MALEATE 10 MG PO TABS
10.0000 mg | ORAL_TABLET | Freq: Once | ORAL | Status: AC
  Administered 2018-01-12: 10 mg via ORAL

## 2018-01-12 NOTE — Patient Instructions (Signed)

## 2018-01-12 NOTE — Patient Instructions (Signed)
[facility] Discharge Instructions for Patients Receiving Chemotherapy  Today you received the following chemotherapy agents Gemzar  To help prevent nausea and vomiting after your treatment, we encourage you to take your nausea medication as directed   If you develop nausea and vomiting that is not controlled by your nausea medication, call the clinic.   BELOW ARE SYMPTOMS THAT SHOULD BE REPORTED IMMEDIATELY:  *FEVER GREATER THAN 100.5 F  *CHILLS WITH OR WITHOUT FEVER  NAUSEA AND VOMITING THAT IS NOT CONTROLLED WITH YOUR NAUSEA MEDICATION  *UNUSUAL SHORTNESS OF BREATH  *UNUSUAL BRUISING OR BLEEDING  TENDERNESS IN MOUTH AND THROAT WITH OR WITHOUT PRESENCE OF ULCERS  *URINARY PROBLEMS  *BOWEL PROBLEMS  UNUSUAL RASH Items with * indicate a potential emergency and should be followed up as soon as possible.  Feel free to call the clinic should you have any questions or concerns. The clinic phone number is [phone].  Please show the [facility] at check-in to the Emergency Department and triage nurse.    Gemcitabine (Gemzar) injection What is this medicine? GEMCITABINE (jem SIT a been) is a chemotherapy drug. This medicine is used to treat many types of cancer like breast cancer, lung cancer, pancreatic cancer, and ovarian cancer. This medicine may be used for other purposes; ask your health care provider or pharmacist if you have questions. COMMON BRAND NAME(S): Gemzar What should I tell my health care provider before I take this medicine? They need to know if you have any of these conditions: -blood disorders -infection -kidney disease -liver disease -recent or ongoing radiation therapy -an unusual or allergic reaction to gemcitabine, other chemotherapy, other medicines, foods, dyes, or preservatives -pregnant or trying to get pregnant -breast-feeding How should I use this medicine? This drug is given as an infusion into a vein. It is  administered in a hospital or clinic by a specially trained health care professional. Talk to your pediatrician regarding the use of this medicine in children. Special care may be needed. Overdosage: If you think you have taken too much of this medicine contact a poison control center or emergency room at once. NOTE: This medicine is only for you. Do not share this medicine with others. What if I miss a dose? It is important not to miss your dose. Call your doctor or health care professional if you are unable to keep an appointment. What may interact with this medicine? -medicines to increase blood counts like filgrastim, pegfilgrastim, sargramostim -some other chemotherapy drugs like cisplatin -vaccines Talk to your doctor or health care professional before taking any of these medicines: -acetaminophen -aspirin -ibuprofen -ketoprofen -naproxen This list may not describe all possible interactions. Give your health care provider a list of all the medicines, herbs, non-prescription drugs, or dietary supplements you use. Also tell them if you smoke, drink alcohol, or use illegal drugs. Some items may interact with your medicine. What should I watch for while using this medicine? Visit your doctor for checks on your progress. This drug may make you feel generally unwell. This is not uncommon, as chemotherapy can affect healthy cells as well as cancer cells. Report any side effects. Continue your course of treatment even though you feel ill unless your doctor tells you to stop. In some cases, you may be given additional medicines to help with side effects. Follow all directions for their use. Call your doctor or health care professional for advice if you get a fever, chills or sore throat, or other symptoms of  a cold or flu. Do not treat yourself. This drug decreases your body's ability to fight infections. Try to avoid being around people who are sick. This medicine may increase your risk to bruise  or bleed. Call your doctor or health care professional if you notice any unusual bleeding. Be careful brushing and flossing your teeth or using a toothpick because you may get an infection or bleed more easily. If you have any dental work done, tell your dentist you are receiving this medicine. Avoid taking products that contain aspirin, acetaminophen, ibuprofen, naproxen, or ketoprofen unless instructed by your doctor. These medicines may hide a fever. Women should inform their doctor if they wish to become pregnant or think they might be pregnant. There is a potential for serious side effects to an unborn child. Talk to your health care professional or pharmacist for more information. Do not breast-feed an infant while taking this medicine. What side effects may I notice from receiving this medicine? Side effects that you should report to your doctor or health care professional as soon as possible: -allergic reactions like skin rash, itching or hives, swelling of the face, lips, or tongue -low blood counts - this medicine may decrease the number of white blood cells, red blood cells and platelets. You may be at increased risk for infections and bleeding. -signs of infection - fever or chills, cough, sore throat, pain or difficulty passing urine -signs of decreased platelets or bleeding - bruising, pinpoint red spots on the skin, black, tarry stools, blood in the urine -signs of decreased red blood cells - unusually weak or tired, fainting spells, lightheadedness -breathing problems -chest pain -mouth sores -nausea and vomiting -pain, swelling, redness at site where injected -pain, tingling, numbness in the hands or feet -stomach pain -swelling of ankles, feet, hands -unusual bleeding Side effects that usually do not require medical attention (report to your doctor or health care professional if they continue or are bothersome): -constipation -diarrhea -hair loss -loss of appetite -stomach  upset This list may not describe all possible side effects. Call your doctor for medical advice about side effects. You may report side effects to FDA at 1-800-FDA-1088. Where should I keep my medicine? This drug is given in a hospital or clinic and will not be stored at home. NOTE: This sheet is a summary. It may not cover all possible information. If you have questions about this medicine, talk to your doctor, pharmacist, or health care provider.  2018 Elsevier/Gold Standard (2008-01-17 18:45:54)

## 2018-01-13 NOTE — Telephone Encounter (Signed)
Error opening  

## 2018-01-17 ENCOUNTER — Telehealth: Payer: Self-pay | Admitting: *Deleted

## 2018-01-17 NOTE — Telephone Encounter (Signed)
"  Neuropathy I mentioned before has gotten worse in my feet.  Already bought better shoes to help but since I started  the new treatment Friday, my legs feel so tight from the knee down.  No swelling just tight.  I can't walk.  My family has to take me to the bathroom.  I also need to know why I'm scheduled tomorrow morning.  Did the scheduler make a mistake?  Please call me [phone]."  Routing call information to collaborative nurse and provider for review.  Further patient communication through collaborative nurse.

## 2018-01-18 ENCOUNTER — Inpatient Hospital Stay: Payer: Medicare Other

## 2018-01-18 ENCOUNTER — Other Ambulatory Visit: Payer: Medicare Other

## 2018-01-18 ENCOUNTER — Inpatient Hospital Stay: Payer: Medicare Other | Attending: Internal Medicine | Admitting: Medical

## 2018-01-18 ENCOUNTER — Other Ambulatory Visit: Payer: Self-pay | Admitting: Internal Medicine

## 2018-01-18 ENCOUNTER — Ambulatory Visit: Payer: Medicare Other

## 2018-01-18 ENCOUNTER — Ambulatory Visit: Payer: Medicare Other | Admitting: Internal Medicine

## 2018-01-18 VITALS — BP 121/72 | HR 104 | Temp 98.4°F | Resp 18

## 2018-01-18 DIAGNOSIS — T451X5A Adverse effect of antineoplastic and immunosuppressive drugs, initial encounter: Secondary | ICD-10-CM

## 2018-01-18 DIAGNOSIS — C3491 Malignant neoplasm of unspecified part of right bronchus or lung: Secondary | ICD-10-CM

## 2018-01-18 DIAGNOSIS — C3481 Malignant neoplasm of overlapping sites of right bronchus and lung: Secondary | ICD-10-CM | POA: Diagnosis not present

## 2018-01-18 DIAGNOSIS — G629 Polyneuropathy, unspecified: Secondary | ICD-10-CM | POA: Diagnosis not present

## 2018-01-18 DIAGNOSIS — Z452 Encounter for adjustment and management of vascular access device: Secondary | ICD-10-CM | POA: Diagnosis not present

## 2018-01-18 DIAGNOSIS — E860 Dehydration: Secondary | ICD-10-CM

## 2018-01-18 DIAGNOSIS — E876 Hypokalemia: Secondary | ICD-10-CM | POA: Diagnosis not present

## 2018-01-18 DIAGNOSIS — G620 Drug-induced polyneuropathy: Secondary | ICD-10-CM

## 2018-01-18 DIAGNOSIS — G609 Hereditary and idiopathic neuropathy, unspecified: Secondary | ICD-10-CM

## 2018-01-18 LAB — CMP (CANCER CENTER ONLY)
ALBUMIN: 3 g/dL — AB (ref 3.5–5.0)
ALK PHOS: 150 U/L (ref 40–150)
ALT: 60 U/L — ABNORMAL HIGH (ref 0–55)
ANION GAP: 9 (ref 3–11)
AST: 72 U/L — AB (ref 5–34)
BILIRUBIN TOTAL: 0.7 mg/dL (ref 0.2–1.2)
BUN: 8 mg/dL (ref 7–26)
CALCIUM: 9.6 mg/dL (ref 8.4–10.4)
CO2: 26 mmol/L (ref 22–29)
Chloride: 102 mmol/L (ref 98–109)
Creatinine: 0.72 mg/dL (ref 0.60–1.10)
GFR, Est AFR Am: >60 mL/min (ref >60)
GFR, Estimated: >60 mL/min (ref >60)
GLUCOSE: 122 mg/dL (ref 70–140)
POTASSIUM: 3.2 mmol/L — AB (ref 3.5–5.1)
SODIUM: 137 mmol/L (ref 136–145)
Total Protein: 7.6 g/dL (ref 6.4–8.3)

## 2018-01-18 LAB — CBC WITH DIFFERENTIAL (CANCER CENTER ONLY)
Basophils Absolute: 0 10*3/uL (ref 0.0–0.1)
Basophils Relative: 1 %
Eosinophils Absolute: 0 10*3/uL (ref 0.0–0.5)
Eosinophils Relative: 0 %
HEMATOCRIT: 25.4 % — AB (ref 34.8–46.6)
Hemoglobin: 8.4 g/dL — ABNORMAL LOW (ref 11.6–15.9)
LYMPHS PCT: 26 %
Lymphs Abs: 0.4 10*3/uL — ABNORMAL LOW (ref 0.9–3.3)
MCH: 32.9 pg (ref 25.1–34.0)
MCHC: 33.2 g/dL (ref 31.5–36.0)
MCV: 99.1 fL (ref 79.5–101.0)
MONO ABS: 0.1 10*3/uL (ref 0.1–0.9)
Monocytes Relative: 5 %
NEUTROS ABS: 1 10*3/uL — AB (ref 1.5–6.5)
Neutrophils Relative %: 68 %
Platelet Count: 86 10*3/uL — ABNORMAL LOW (ref 145–400)
RBC: 2.56 MIL/uL — ABNORMAL LOW (ref 3.70–5.45)
RDW: 19.7 % — AB (ref 11.2–14.5)
WBC Count: 1.4 10*3/uL — ABNORMAL LOW (ref 3.9–10.3)

## 2018-01-18 MED ORDER — SODIUM CHLORIDE 0.9 % IV SOLN
2000.0000 mg | Freq: Once | INTRAVENOUS | Status: AC
  Administered 2018-01-18: 2000 mg via INTRAVENOUS
  Filled 2018-01-18: qty 52.6

## 2018-01-18 MED ORDER — SODIUM CHLORIDE 0.9 % IV SOLN
Freq: Once | INTRAVENOUS | Status: AC
  Administered 2018-01-18: 09:00:00 via INTRAVENOUS

## 2018-01-18 MED ORDER — HEPARIN SOD (PORK) LOCK FLUSH 100 UNIT/ML IV SOLN
500.0000 [IU] | Freq: Once | INTRAVENOUS | Status: AC | PRN
  Administered 2018-01-18: 500 [IU]
  Filled 2018-01-18: qty 5

## 2018-01-18 MED ORDER — SODIUM CHLORIDE 0.9% FLUSH
10.0000 mL | INTRAVENOUS | Status: DC | PRN
  Administered 2018-01-18: 10 mL
  Filled 2018-01-18: qty 10

## 2018-01-18 MED ORDER — PROCHLORPERAZINE MALEATE 10 MG PO TABS
ORAL_TABLET | ORAL | Status: AC
  Filled 2018-01-18: qty 1

## 2018-01-18 MED ORDER — PROCHLORPERAZINE MALEATE 10 MG PO TABS
10.0000 mg | ORAL_TABLET | Freq: Once | ORAL | Status: AC
  Administered 2018-01-18: 10 mg via ORAL

## 2018-01-18 NOTE — Progress Notes (Signed)
Per Dr. Julien Nordmann, okay to treat pt today with ANC of 1.0 and plt of 86.  Pt will have apt tomorrow 01/19/18 for neulasta injection

## 2018-01-18 NOTE — Patient Instructions (Signed)
[facility] °Discharge Instructions for Patients Receiving Chemotherapy ° °Today you received the following chemotherapy agents Gemzar ° °To help prevent nausea and vomiting after your treatment, we encourage you to take your nausea medication as directed. °  °If you develop nausea and vomiting that is not controlled by your nausea medication, call the clinic.  ° °BELOW ARE SYMPTOMS THAT SHOULD BE REPORTED IMMEDIATELY: °· *FEVER GREATER THAN 100.5 F °· *CHILLS WITH OR WITHOUT FEVER °· NAUSEA AND VOMITING THAT IS NOT CONTROLLED WITH YOUR NAUSEA MEDICATION °· *UNUSUAL SHORTNESS OF BREATH °· *UNUSUAL BRUISING OR BLEEDING °· TENDERNESS IN MOUTH AND THROAT WITH OR WITHOUT PRESENCE OF ULCERS °· *URINARY PROBLEMS °· *BOWEL PROBLEMS °· UNUSUAL RASH °Items with * indicate a potential emergency and should be followed up as soon as possible. ° °Feel free to call the clinic should you have any questions or concerns. The clinic phone number is [phone]. ° °Please show the CHEMO ALERT CARD at check-in to the Emergency Department and triage nurse. ° ° °

## 2018-01-19 ENCOUNTER — Inpatient Hospital Stay (HOSPITAL_BASED_OUTPATIENT_CLINIC_OR_DEPARTMENT_OTHER): Payer: Medicare Other | Admitting: Medical

## 2018-01-19 ENCOUNTER — Inpatient Hospital Stay: Payer: Medicare Other | Attending: Internal Medicine

## 2018-01-19 ENCOUNTER — Other Ambulatory Visit: Payer: Self-pay | Admitting: Medical

## 2018-01-19 VITALS — BP 112/68 | HR 113 | Temp 99.0°F | Resp 20

## 2018-01-19 VITALS — BP 99/70 | HR 126 | Temp 99.7°F | Resp 20

## 2018-01-19 DIAGNOSIS — G629 Polyneuropathy, unspecified: Secondary | ICD-10-CM | POA: Insufficient documentation

## 2018-01-19 DIAGNOSIS — R6883 Chills (without fever): Secondary | ICD-10-CM

## 2018-01-19 DIAGNOSIS — D649 Anemia, unspecified: Secondary | ICD-10-CM | POA: Insufficient documentation

## 2018-01-19 DIAGNOSIS — E860 Dehydration: Secondary | ICD-10-CM

## 2018-01-19 DIAGNOSIS — Z5189 Encounter for other specified aftercare: Secondary | ICD-10-CM | POA: Diagnosis not present

## 2018-01-19 DIAGNOSIS — C3481 Malignant neoplasm of overlapping sites of right bronchus and lung: Secondary | ICD-10-CM | POA: Diagnosis not present

## 2018-01-19 DIAGNOSIS — C7989 Secondary malignant neoplasm of other specified sites: Secondary | ICD-10-CM | POA: Diagnosis not present

## 2018-01-19 DIAGNOSIS — C3490 Malignant neoplasm of unspecified part of unspecified bronchus or lung: Secondary | ICD-10-CM | POA: Insufficient documentation

## 2018-01-19 DIAGNOSIS — R112 Nausea with vomiting, unspecified: Secondary | ICD-10-CM

## 2018-01-19 DIAGNOSIS — E876 Hypokalemia: Secondary | ICD-10-CM

## 2018-01-19 DIAGNOSIS — Z452 Encounter for adjustment and management of vascular access device: Secondary | ICD-10-CM | POA: Insufficient documentation

## 2018-01-19 DIAGNOSIS — R5383 Other fatigue: Secondary | ICD-10-CM | POA: Diagnosis not present

## 2018-01-19 DIAGNOSIS — Z5111 Encounter for antineoplastic chemotherapy: Secondary | ICD-10-CM | POA: Diagnosis not present

## 2018-01-19 LAB — CMP (CANCER CENTER ONLY)
ALT: 50 U/L (ref 0–55)
AST: 59 U/L — ABNORMAL HIGH (ref 5–34)
Albumin: 2.8 g/dL — ABNORMAL LOW (ref 3.5–5.0)
Alkaline Phosphatase: 148 U/L (ref 40–150)
Anion gap: 9 (ref 3–11)
BUN: 9 mg/dL (ref 7–26)
CALCIUM: 9.1 mg/dL (ref 8.4–10.4)
CO2: 26 mmol/L (ref 22–29)
CREATININE: 0.69 mg/dL (ref 0.60–1.10)
Chloride: 101 mmol/L (ref 98–109)
GFR, Estimated: >60 mL/min (ref >60)
Glucose, Bld: 99 mg/dL (ref 70–140)
Potassium: 3.2 mmol/L — ABNORMAL LOW (ref 3.5–5.1)
Sodium: 136 mmol/L (ref 136–145)
Total Bilirubin: 1.2 mg/dL (ref 0.2–1.2)
Total Protein: 7.3 g/dL (ref 6.4–8.3)

## 2018-01-19 LAB — CBC WITH DIFFERENTIAL (CANCER CENTER ONLY)
BASOS ABS: 0 10*3/uL (ref 0.0–0.1)
Basophils Relative: 1 %
EOS ABS: 0 10*3/uL (ref 0.0–0.5)
Eosinophils Relative: 0 %
HCT: 23.5 % — ABNORMAL LOW (ref 34.8–46.6)
HEMOGLOBIN: 7.8 g/dL — AB (ref 11.6–15.9)
Lymphocytes Relative: 12 %
Lymphs Abs: 0.3 10*3/uL — ABNORMAL LOW (ref 0.9–3.3)
MCH: 32.9 pg (ref 25.1–34.0)
MCHC: 33.3 g/dL (ref 31.5–36.0)
MCV: 98.8 fL (ref 79.5–101.0)
Monocytes Absolute: 0 10*3/uL — ABNORMAL LOW (ref 0.1–0.9)
Monocytes Relative: 1 %
NEUTROS PCT: 86 %
Neutro Abs: 2.3 10*3/uL (ref 1.5–6.5)
Platelet Count: 61 10*3/uL — ABNORMAL LOW (ref 145–400)
RBC: 2.38 MIL/uL — AB (ref 3.70–5.45)
RDW: 19.8 % — ABNORMAL HIGH (ref 11.2–14.5)
WBC Count: 2.7 10*3/uL — ABNORMAL LOW (ref 3.9–10.3)

## 2018-01-19 MED ORDER — DEXAMETHASONE SODIUM PHOSPHATE 10 MG/ML IJ SOLN
10.0000 mg | Freq: Once | INTRAMUSCULAR | Status: AC
  Administered 2018-01-19: 10 mg via INTRAVENOUS

## 2018-01-19 MED ORDER — ONDANSETRON HCL 4 MG/2ML IJ SOLN
8.0000 mg | Freq: Once | INTRAMUSCULAR | Status: AC
  Administered 2018-01-19: 8 mg via INTRAVENOUS

## 2018-01-19 MED ORDER — SODIUM CHLORIDE 0.9% FLUSH
10.0000 mL | INTRAVENOUS | Status: DC | PRN
  Administered 2018-01-19: 10 mL
  Filled 2018-01-19: qty 10

## 2018-01-19 MED ORDER — SODIUM CHLORIDE 0.9 % IV SOLN
Freq: Once | INTRAVENOUS | Status: AC
  Administered 2018-01-19: 15:00:00 via INTRAVENOUS

## 2018-01-19 MED ORDER — LORAZEPAM 1 MG PO TABS
ORAL_TABLET | ORAL | Status: AC
  Filled 2018-01-19: qty 1

## 2018-01-19 MED ORDER — PEGFILGRASTIM-CBQV 6 MG/0.6ML ~~LOC~~ SOSY
PREFILLED_SYRINGE | SUBCUTANEOUS | Status: AC
  Filled 2018-01-19: qty 0.6

## 2018-01-19 MED ORDER — HEPARIN SOD (PORK) LOCK FLUSH 100 UNIT/ML IV SOLN
500.0000 [IU] | Freq: Once | INTRAVENOUS | Status: AC | PRN
  Administered 2018-01-19: 500 [IU]
  Filled 2018-01-19: qty 5

## 2018-01-19 MED ORDER — ONDANSETRON HCL 4 MG/2ML IJ SOLN
INTRAMUSCULAR | Status: AC
  Filled 2018-01-19: qty 4

## 2018-01-19 MED ORDER — SODIUM CHLORIDE 0.9 % IV SOLN: Freq: Once | INTRAVENOUS | Status: DC

## 2018-01-19 MED ORDER — LORAZEPAM 1 MG PO TABS
0.5000 mg | ORAL_TABLET | Freq: Once | ORAL | Status: AC
  Administered 2018-01-19: 0.5 mg via SUBLINGUAL

## 2018-01-19 MED ORDER — DEXAMETHASONE SODIUM PHOSPHATE 10 MG/ML IJ SOLN
INTRAMUSCULAR | Status: AC
  Filled 2018-01-19: qty 1

## 2018-01-19 MED ORDER — PEGFILGRASTIM-CBQV 6 MG/0.6ML ~~LOC~~ SOSY
6.0000 mg | PREFILLED_SYRINGE | Freq: Once | SUBCUTANEOUS | Status: AC
  Administered 2018-01-19: 6 mg via SUBCUTANEOUS

## 2018-01-19 NOTE — Progress Notes (Signed)
Unable to get second set of blood cultures.  Phleb Kim attempted twice in LFA and LAC, unsuccessful.  First set drawn from port.  RN and PA Lucianne Lei aware.  Pt returning Friday for blood and IV potassium.  Pt verbalized understanding, blood band in place.

## 2018-01-19 NOTE — Patient Instructions (Signed)
Dehydration, Adult Dehydration is when there is not enough fluid or water in your body. This happens when you lose more fluids than you take in. Dehydration can range from mild to very bad. It should be treated right away to keep it from getting very bad. Symptoms of mild dehydration may include:  Thirst.  Dry lips.  Slightly dry mouth.  Dry, warm skin.  Dizziness. Symptoms of moderate dehydration may include:  Very dry mouth.  Muscle cramps.  Dark pee (urine). Pee may be the color of tea.  Your body making less pee.  Your eyes making fewer tears.  Heartbeat that is uneven or faster than normal (palpitations).  Headache.  Light-headedness, especially when you stand up from sitting.  Fainting (syncope). Symptoms of very bad dehydration may include:  Changes in skin, such as: ? Cold and clammy skin. ? Blotchy (mottled) or pale skin. ? Skin that does not quickly return to normal after being lightly pinched and let go (poor skin turgor).  Changes in body fluids, such as: ? Feeling very thirsty. ? Your eyes making fewer tears. ? Not sweating when body temperature is high, such as in hot weather. ? Your body making very little pee.  Changes in vital signs, such as: ? Weak pulse. ? Pulse that is more than 100 beats a minute when you are sitting still. ? Fast breathing. ? Low blood pressure.  Other changes, such as: ? Sunken eyes. ? Cold hands and feet. ? Confusion. ? Lack of energy (lethargy). ? Trouble waking up from sleep. ? Short-term weight loss. ? Unconsciousness. Follow these instructions at home:  If told by your doctor, drink an ORS: ? Make an ORS by using instructions on the package. ? Start by drinking small amounts, about  cup (120 mL) every 5-10 minutes. ? Slowly drink more until you have had the amount that your doctor said to have.  Drink enough clear fluid to keep your pee clear or pale yellow. If you were told to drink an ORS, finish the ORS  first, then start slowly drinking clear fluids. Drink fluids such as: ? Water. Do not drink only water by itself. Doing that can make the salt (sodium) level in your body get too low (hyponatremia). ? Ice chips. ? Fruit juice that you have added water to (diluted). ? Low-calorie sports drinks.  Avoid: ? Alcohol. ? Drinks that have a lot of sugar. These include high-calorie sports drinks, fruit juice that does not have water added, and soda. ? Caffeine. ? Foods that are greasy or have a lot of fat or sugar.  Take over-the-counter and prescription medicines only as told by your doctor.  Do not take salt tablets. Doing that can make the salt level in your body get too high (hypernatremia).  Eat foods that have minerals (electrolytes). Examples include bananas, oranges, potatoes, tomatoes, and spinach.  Keep all follow-up visits as told by your doctor. This is important. Contact a doctor if:  You have belly (abdominal) pain that: ? Gets worse. ? Stays in one area (localizes).  You have a rash.  You have a stiff neck.  You get angry or annoyed more easily than normal (irritability).  You are more sleepy than normal.  You have a harder time waking up than normal.  You feel: ? Weak. ? Dizzy. ? Very thirsty.  You have peed (urinated) only a small amount of very dark pee during 6-8 hours. Get help right away if:  You have symptoms of   very bad dehydration.  You cannot drink fluids without throwing up (vomiting).  Your symptoms get worse with treatment.  You have a fever.  You have a very bad headache.  You are throwing up or having watery poop (diarrhea) and it: ? Gets worse. ? Does not go away.  You have blood or something green (bile) in your throw-up.  You have blood in your poop (stool). This may cause poop to look black and tarry.  You have not peed in 6-8 hours.  You pass out (faint).  Your heart rate when you are sitting still is more than 100 beats a  minute.  You have trouble breathing. This information is not intended to replace advice given to you by your health care provider. Make sure you discuss any questions you have with your health care provider. Document Released: 07/04/2009 Document Revised: 03/27/2016 Document Reviewed: 11/01/2015 Elsevier Interactive Patient Education  2018 [employer]  

## 2018-01-20 ENCOUNTER — Other Ambulatory Visit: Payer: Self-pay | Admitting: Medical

## 2018-01-20 ENCOUNTER — Telehealth: Payer: Self-pay | Admitting: Internal Medicine

## 2018-01-20 DIAGNOSIS — D649 Anemia, unspecified: Secondary | ICD-10-CM | POA: Diagnosis not present

## 2018-01-20 DIAGNOSIS — E876 Hypokalemia: Secondary | ICD-10-CM

## 2018-01-20 DIAGNOSIS — R000 Tachycardia, unspecified: Secondary | ICD-10-CM | POA: Diagnosis not present

## 2018-01-20 DIAGNOSIS — C3490 Malignant neoplasm of unspecified part of unspecified bronchus or lung: Secondary | ICD-10-CM | POA: Diagnosis not present

## 2018-01-20 DIAGNOSIS — D696 Thrombocytopenia, unspecified: Secondary | ICD-10-CM | POA: Diagnosis not present

## 2018-01-20 MED ORDER — POTASSIUM CHLORIDE 20 MEQ/100ML IV SOLN: 20.0000 meq | Freq: Once | INTRAVENOUS | Status: DC

## 2018-01-20 MED ORDER — GABAPENTIN 300 MG PO CAPS: ORAL_CAPSULE | ORAL | 0 refills | Status: DC

## 2018-01-20 NOTE — Telephone Encounter (Signed)
Scheduled appt per 5/1 sch msg - left vm with appt

## 2018-01-20 NOTE — Progress Notes (Signed)
Theresa [name] was seen in the infusion room today.  She reports continued neuropathy in her feet despite using gabapentin 300 mg p.o. 3 times daily.  She was instructed to increase her gabapentin so that she is taking 1 capsule each morning, 1 capsule each afternoon, and 2 capsules at bedtime.  She is recently refill this prescription and does not need any prescription at this time.  Sandi Mealy, MHS, PA-C

## 2018-01-21 ENCOUNTER — Other Ambulatory Visit: Payer: Self-pay | Admitting: Medical Oncology

## 2018-01-21 ENCOUNTER — Inpatient Hospital Stay: Payer: Medicare Other

## 2018-01-21 VITALS — BP 113/70 | HR 105 | Temp 98.7°F | Resp 17

## 2018-01-21 DIAGNOSIS — Z452 Encounter for adjustment and management of vascular access device: Secondary | ICD-10-CM | POA: Diagnosis not present

## 2018-01-21 DIAGNOSIS — C3481 Malignant neoplasm of overlapping sites of right bronchus and lung: Secondary | ICD-10-CM | POA: Diagnosis not present

## 2018-01-21 DIAGNOSIS — E860 Dehydration: Secondary | ICD-10-CM | POA: Diagnosis not present

## 2018-01-21 DIAGNOSIS — Z5111 Encounter for antineoplastic chemotherapy: Secondary | ICD-10-CM | POA: Diagnosis not present

## 2018-01-21 DIAGNOSIS — D649 Anemia, unspecified: Secondary | ICD-10-CM

## 2018-01-21 DIAGNOSIS — C7989 Secondary malignant neoplasm of other specified sites: Secondary | ICD-10-CM | POA: Diagnosis not present

## 2018-01-21 DIAGNOSIS — C3491 Malignant neoplasm of unspecified part of right bronchus or lung: Secondary | ICD-10-CM

## 2018-01-21 DIAGNOSIS — Z5189 Encounter for other specified aftercare: Secondary | ICD-10-CM | POA: Diagnosis not present

## 2018-01-21 LAB — PREPARE RBC (CROSSMATCH)

## 2018-01-21 MED ORDER — ACETAMINOPHEN 325 MG PO TABS
650.0000 mg | ORAL_TABLET | Freq: Once | ORAL | Status: AC
  Administered 2018-01-21: 650 mg via ORAL

## 2018-01-21 MED ORDER — HEPARIN SOD (PORK) LOCK FLUSH 100 UNIT/ML IV SOLN
250.0000 [IU] | INTRAVENOUS | Status: DC | PRN
  Filled 2018-01-21: qty 5

## 2018-01-21 MED ORDER — SODIUM CHLORIDE 0.9% FLUSH
10.0000 mL | INTRAVENOUS | Status: DC | PRN
  Administered 2018-01-21: 10 mL
  Filled 2018-01-21: qty 10

## 2018-01-21 MED ORDER — OXYCODONE HCL 5 MG PO TABS: 5.0000 mg | ORAL_TABLET | Freq: Four times a day (QID) | ORAL | 0 refills | Status: DC | PRN

## 2018-01-21 MED ORDER — ACETAMINOPHEN 325 MG PO TABS
ORAL_TABLET | ORAL | Status: AC
  Filled 2018-01-21: qty 2

## 2018-01-21 MED ORDER — DIPHENHYDRAMINE HCL 25 MG PO CAPS
25.0000 mg | ORAL_CAPSULE | Freq: Once | ORAL | Status: AC
  Administered 2018-01-21: 25 mg via ORAL

## 2018-01-21 MED ORDER — HEPARIN SOD (PORK) LOCK FLUSH 100 UNIT/ML IV SOLN
500.0000 [IU] | Freq: Once | INTRAVENOUS | Status: AC | PRN
  Administered 2018-01-21: 500 [IU]
  Filled 2018-01-21: qty 5

## 2018-01-21 MED ORDER — DIPHENHYDRAMINE HCL 25 MG PO CAPS
ORAL_CAPSULE | ORAL | Status: AC
  Filled 2018-01-21: qty 1

## 2018-01-21 NOTE — Progress Notes (Signed)
Oxyir Prescription given to pt.

## 2018-01-21 NOTE — Progress Notes (Signed)
[facility] Progress Note   WILLELLA HARDING [id number] 30-Oct-1960 57 y.o.   Doris Cheadle is managed by Dr. Fanny Bien. Mohamed  Actively treated with chemotherapy: yes  Current Therapy: Gemcitabine  Last Treated: 01/18/2018 (Cycle 1, Day 8)  Assessment: Plan:    Dehydration - Plan: sodium chloride flush (NS) 0.9 % injection 10 mL, heparin lock flush 100 unit/mL, 0.9 %  sodium chloride infusion  Nausea and vomiting, intractability of vomiting not specified, unspecified vomiting type - Plan: LORazepam (ATIVAN) tablet 0.5 mg, dexamethasone (DECADRON) injection 10 mg, ondansetron (ZOFRAN) injection 8 mg, DISCONTINUED: ondansetron (ZOFRAN) 8 mg, dexamethasone (DECADRON) 10 mg in sodium chloride 0.9 % 50 mL IVPB  Anemia, unspecified type - Plan: Practitioner attestation of consent, Complete patient signature process for consent form, Care order/instruction, heparin lock flush 100 unit/mL, Type and screen, Transfuse RBC, acetaminophen (TYLENOL) tablet 650 mg, diphenhydrAMINE (BENADRYL) capsule 25 mg, CANCELED: Prepare RBC  Hypokalemia   Dehydration: The patient was given 1 liter of normal saline IV x 1 today.  Nausea and vomiting: The patient was given Ativan 0.5 mg sublingual x1 and was given IV Zofran 8 mg with Decadron 10 mg.  Anemia: A CBC was collected today.  It returned showing a hemoglobin of 7.8 and hematocrit of 23.5.  Based on this the patient has been scheduled to receive 2 units of packed red blood cells which will be given on 01/21/2018 in the sickle clinic.  Hypokalemia.  Patient's labs returned showing a potassium level of 3.2.  The patient will be given 20 mEq of potassium chloride IV x1 on Friday, 01/21/2018.  Please see After Visit Summary for patient specific instructions.  Future Appointments  Date Time Provider [place]  02/02/2018  8:15 AM [facility] LAB 2 [facility] None  02/02/2018  8:30 AM [facility] FLUSH NURSE [facility] None   02/02/2018  9:00 AM Curcio, Roselie Awkward, NP [facility] None  02/02/2018 10:15 AM [facility] G23 [facility] None  02/02/2018 11:15 AM Cordella Register, Valda Lamb, RD [facility] None  02/09/2018  8:45 AM [facility] LAB 3 [facility] None  02/09/2018  9:00 AM [facility] INJ NURSE [facility] None  02/09/2018  9:30 AM [facility] F20 [facility] None  02/23/2018  9:15 AM [facility] LAB 4 [facility] None  02/23/2018  9:30 AM [facility] FLUSH NURSE [facility] None  02/23/2018 10:00 AM Curcio, Kristin R, NP [facility] None  02/23/2018 11:00 AM [facility] G22 [facility] None  03/02/2018  9:00 AM [facility] LAB 6 [facility] None  03/02/2018  9:30 AM [facility] FLUSH NURSE [facility] None  03/02/2018 10:15 AM [facility] H29 [facility] None  03/16/2018  9:00 AM [facility] LAB 3 [facility] None  03/16/2018  9:15 AM [facility] FLUSH NURSE [facility] None  03/16/2018  9:45 AM Curt Bears, MD [facility] None  03/16/2018 11:00 AM [facility] E14 [facility] None  03/23/2018  9:00 AM [facility] LAB 2 [facility] None  03/23/2018  9:15 AM [facility] FLUSH NURSE [facility] None  03/23/2018 10:00 AM [facility] B6 [facility] None    Orders Placed This Encounter  Procedures  . Type and screen       Subjective:   Patient ID:  TYNASIA MCCAUL is a 57 y.o. (DOB 28-Oct-1960) female.  Chief Complaint:  Chief Complaint  Patient presents with  . Generalized Body Aches    HPI TELEAH VILLAMAR is a 57 year old female with a metastatic non-small cell lung cancer which was originally diagnosed in November 2016.  She has most recently completed cycle 1, day 8 of gemcitabine which was dosed  on 01/18/2018 under the direction of Dr. Fanny Bien. Mohamed.  She presents to the office today with significant fatigue, chills, nausea and vomiting.  She denies fevers or sweats.  Medications: I have reviewed the patient's current medications.  Allergies: No Known Allergies  Past Medical History:  Diagnosis Date  .  Cancer ([place])    5 years ago - cervical cancer  . Encounter for antineoplastic chemotherapy 08/27/2015  . History of radiation therapy 08/19/2015 - 10/07/2015   Site/dose:   The patient's primary tumor and involved lymph nodes were treated to 66 Gy in 30 fractions.  . Hypokalemia 09/30/2015  . Non-small cell carcinoma of lung, stage 3 ([place]) 08/08/2015  . Paroxysmal atrial flutter ([place]) 09/16/2015  . Pneumonia     Past Surgical History:  Procedure Laterality Date  . IR FLUORO GUIDE PORT INSERTION RIGHT  05/21/2017  . IR US GUIDE VASC ACCESS RIGHT  05/21/2017  . TUBAL LIGATION    . VIDEO BRONCHOSCOPY WITH ENDOBRONCHIAL ULTRASOUND N/A 08/02/2015   Procedure: VIDEO BRONCHOSCOPY WITH ENDOBRONCHIAL ULTRASOUND;  Surgeon: [name], MD;  Location: [facility] OR;  Service: Thoracic;  Laterality: N/A;  . WISDOM TOOTH EXTRACTION      Family History  Problem Relation Age of Onset  . COPD Mother   . Diabetes type II Mother   . Heart disease Mother   . High Cholesterol Mother   . Lung cancer Father     Social History   Socioeconomic History  . Marital status: Single    Spouse name: Not on file  . Number of children: Not on file  . Years of education: Not on file  . Highest education level: Not on file  Occupational History  . Not on file  Social Needs  . Financial resource strain: Not on file  . Food insecurity:    Worry: Not on file    Inability: Not on file  . Transportation needs:    Medical: Not on file    Non-medical: Not on file  Tobacco Use  . Smoking status: Former Smoker    Packs/day: 0.30    Years: 5.00    Pack years: 1.50    Types: Cigarettes    Last attempt to quit: 07/31/1990    Years since quitting: 27.4  . Smokeless tobacco: Never Used  Substance and Sexual Activity  . Alcohol use: No    Alcohol/week: 0.0 oz  . Drug use: No  . Sexual activity: Not Currently  Lifestyle  . Physical activity:    Days per week: Not on file    Minutes per session: Not on file   . Stress: Not on file  Relationships  . Social connections:    Talks on phone: Not on file    Gets together: Not on file    Attends religious service: Not on file    Active member of club or organization: Not on file    Attends meetings of clubs or organizations: Not on file    Relationship status: Not on file  . Intimate partner violence:    Fear of current or ex partner: Not on file    Emotionally abused: Not on file    Physically abused: Not on file    Forced sexual activity: Not on file  Other Topics Concern  . Not on file  Social History Narrative  . Not on file    Past Medical History, Surgical history, Social history, and Family history were reviewed and updated as appropriate.  Please see review of systems for further details on the patient's review from today.   Review of Systems:  Review of Systems  Constitutional: Positive for chills and fatigue. Negative for diaphoresis.  HENT: Negative for trouble swallowing.   Respiratory: Positive for cough. Negative for choking, chest tightness, shortness of breath and wheezing.   Cardiovascular: Negative for chest pain and palpitations.  Gastrointestinal: Positive for nausea. Negative for vomiting.  Musculoskeletal: Negative for back pain.  Skin: Negative for color change and rash.  Neurological: Positive for weakness. Negative for dizziness, light-headedness and headaches.    Objective:   Physical Exam:  BP 112/68   Pulse (!) 113   Temp 99 F (37.2 C)   Resp 20   SpO2 100%  ECOG: 1  Physical Exam  Constitutional: No distress.  HENT:  Head: Normocephalic and atraumatic.  Cardiovascular: Regular rhythm, S1 normal and S2 normal. Tachycardia present. Exam reveals no gallop and no friction rub.  No murmur heard. Pulmonary/Chest: No respiratory distress. She has no wheezes. She has no rales.  Neurological: She is alert.  Skin: Skin is warm and dry. No rash noted. She is not diaphoretic. No erythema.    Lab  Review:     Component Value Date/Time   NA 136 01/19/2018 1425   NA 138 09/24/2017 1600   K 3.2 (L) 01/19/2018 1425   K 3.4 (L) 09/24/2017 1600   CL 101 01/19/2018 1425   CO2 26 01/19/2018 1425   CO2 25 09/24/2017 1600   GLUCOSE 99 01/19/2018 1425   GLUCOSE 125 09/24/2017 1600   BUN 9 01/19/2018 1425   BUN 9.5 09/24/2017 1600   CREATININE 0.69 01/19/2018 1425   CREATININE 0.8 09/24/2017 1600   CALCIUM 9.1 01/19/2018 1425   CALCIUM 9.0 09/24/2017 1600   PROT 7.3 01/19/2018 1425   PROT 7.4 09/24/2017 1600   ALBUMIN 2.8 (L) 01/19/2018 1425   ALBUMIN 3.5 09/24/2017 1600   AST 59 (H) 01/19/2018 1425   AST 31 09/24/2017 1600   ALT 50 01/19/2018 1425   ALT 24 09/24/2017 1600   ALKPHOS 148 01/19/2018 1425   ALKPHOS 92 09/24/2017 1600   BILITOT 1.2 01/19/2018 1425   BILITOT 0.40 09/24/2017 1600   GFRNONAA >60 01/19/2018 1425   GFRAA >60 01/19/2018 1425       Component Value Date/Time   WBC 2.7 (L) 01/19/2018 1425   WBC 4.9 01/04/2018 1115   RBC 2.38 (L) 01/19/2018 1425   HGB 7.8 (L) 01/19/2018 1425   HGB 8.3 (L) 09/24/2017 1601   HCT 23.5 (L) 01/19/2018 1425   HCT 24.5 (L) 09/24/2017 1601   PLT 61 (L) 01/19/2018 1425   PLT 92 (L) 09/24/2017 1601   PLT 300 02/11/2017 1632   MCV 98.8 01/19/2018 1425   MCV 90.7 09/24/2017 1601   MCH 32.9 01/19/2018 1425   MCHC 33.3 01/19/2018 1425   RDW 19.8 (H) 01/19/2018 1425   RDW 18.7 (H) 09/24/2017 1601   LYMPHSABS 0.3 (L) 01/19/2018 1425   LYMPHSABS 0.8 (L) 09/24/2017 1601   MONOABS 0.0 (L) 01/19/2018 1425   MONOABS 0.2 09/24/2017 1601   EOSABS 0.0 01/19/2018 1425   EOSABS 0.0 09/24/2017 1601   BASOSABS 0.0 01/19/2018 1425   BASOSABS 0.0 09/24/2017 1601   -------------------------------  Imaging from last 24 hours (if applicable):  Radiology interpretation: Dg Chest 1 View  Result Date: 01/07/2018 CLINICAL DATA:  Shortness of breath following right thoracentesis. EXAM: CHEST  1 VIEW COMPARISON:  One-view chest x-ray  11/05/2017. CT of the chest 12/30/2017. FINDINGS: Right pleural effusion is similar in volume to the previous post thoracentesis radiograph. There is no pneumothorax. Loculation of fluid is again suspected. A right IJ Port-A-Cath is in place. Left lung is clear. IMPRESSION: 1. Residual moderate right pleural effusion following thoracentesis. 2. No pneumothorax or radiographic evidence for complication. Electronically Signed   By: San Morelle M.D.   On: 01/07/2018 11:52   Ct Chest W Contrast  Result Date: 12/30/2017 CLINICAL DATA:  Restaging of metastatic non-small cell lung cancer EXAM: CT CHEST, ABDOMEN, AND PELVIS WITH CONTRAST TECHNIQUE: Multidetector CT imaging of the chest, abdomen and pelvis was performed following the standard protocol during bolus administration of intravenous contrast. CONTRAST:  120mL OMNIPAQUE IOHEXOL 300 MG/ML  SOLN COMPARISON:  Multiple exams, including 10/25/2017 FINDINGS: CT CHEST FINDINGS Cardiovascular: Right Port-A-Cath tip: Right atrium. Aberrant right subclavian artery passes behind the esophagus. Mediastinum/Nodes: Small type 1 hiatal hernia. Lungs/Pleura: Large right pleural effusion with increasing pleural mass like irregularity including a 2.0 by 2.5 cm right apical mass not appreciable previously. The large right chest wall mass is also invading into the pleural space through the right eighth and ninth intercostal spaces. Aside from the passive atelectasis there is right perihilar airspace opacity likely reflecting prior radiation pneumonitis, and some right perihilar atelectasis. This is similar to the prior exam. There is some indistinct airspace opacity medially in the posterior basal segment left lower lobe. The previous solid nodular component of this process has resolved. Musculoskeletal: The large right posterior chest wall mass primarily involving the latissimus dorsi and also now extending into the right serratus anterior has further enlarged. Mass  measures 10.2 by 5.5 cm on image 38/2, previously 9.7 by 4.8 cm. Increased intercostal extension through the eighth intercostal space in addition to the ninth intercostal space. The component medially along the scapula and serratus anterior/teres major measures 3.5 cm along its short axis, previously 2.0 cm. Bony destruction along the right ninth rib noted, fairly similar to prior. CT ABDOMEN PELVIS FINDINGS Hepatobiliary: Pattern and appearance of the small hypodense liver lesions unchanged. Gallbladder unremarkable. Pancreas: Unremarkable Spleen: Unremarkable Adrenals/Urinary Tract: Diffuse thickening of the urinary bladder wall. The adrenal glands appear unremarkable. Stomach/Bowel: Unremarkable Vascular/Lymphatic: Aortoiliac atherosclerotic vascular disease. Reproductive: Fatty mass along the right adnexa, stable, possibly a dermoid. This measures 5.8 cm in long axis today, unchanged. Other: No supplemental non-categorized findings. Musculoskeletal: Unremarkable IMPRESSION: 1. Progressive enlargement of the right posterior chest wall mass, now invading the pleural space through the eighth and ninth intercostal spaces. New pleural nodularity including a 2.5 by 2.0 cm right apical mass along the pleural effusion margin. Large malignant right pleural effusion. Stable appearance of early destruction of the right ninth rib inferiorly. 2. No findings of active malignancy in the abdomen or pelvis. 3. Improved but not resolved airspace opacity medially in the left lower lobe. The nodular component shown previously is no longer present. 4. Diffuse wall thickening of the urinary bladder wall suspicious for cystitis. 5. 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 Small type 1 hiatal hernia. Hypodense liver lesions are likely cysts. Right ovarian dermoid. Electronically Signed   By: Margarett Viti Clines M.D.   On: 12/30/2017 14:59   Ct  Abdomen Pelvis W Contrast  Result Date: 12/30/2017 CLINICAL DATA:  Restaging of metastatic non-small cell lung cancer EXAM: CT CHEST, ABDOMEN, AND PELVIS WITH CONTRAST TECHNIQUE: Multidetector CT imaging of the chest, abdomen and pelvis  was performed following the standard protocol during bolus administration of intravenous contrast. CONTRAST:  130mL OMNIPAQUE IOHEXOL 300 MG/ML  SOLN COMPARISON:  Multiple exams, including 10/25/2017 FINDINGS: CT CHEST FINDINGS Cardiovascular: Right Port-A-Cath tip: Right atrium. Aberrant right subclavian artery passes behind the esophagus. Mediastinum/Nodes: Small type 1 hiatal hernia. Lungs/Pleura: Large right pleural effusion with increasing pleural mass like irregularity including a 2.0 by 2.5 cm right apical mass not appreciable previously. The large right chest wall mass is also invading into the pleural space through the right eighth and ninth intercostal spaces. Aside from the passive atelectasis there is right perihilar airspace opacity likely reflecting prior radiation pneumonitis, and some right perihilar atelectasis. This is similar to the prior exam. There is some indistinct airspace opacity medially in the posterior basal segment left lower lobe. The previous solid nodular component of this process has resolved. Musculoskeletal: The large right posterior chest wall mass primarily involving the latissimus dorsi and also now extending into the right serratus anterior has further enlarged. Mass measures 10.2 by 5.5 cm on image 38/2, previously 9.7 by 4.8 cm. Increased intercostal extension through the eighth intercostal space in addition to the ninth intercostal space. The component medially along the scapula and serratus anterior/teres major measures 3.5 cm along its short axis, previously 2.0 cm. Bony destruction along the right ninth rib noted, fairly similar to prior. CT ABDOMEN PELVIS FINDINGS Hepatobiliary: Pattern and appearance of the small hypodense liver  lesions unchanged. Gallbladder unremarkable. Pancreas: Unremarkable Spleen: Unremarkable Adrenals/Urinary Tract: Diffuse thickening of the urinary bladder wall. The adrenal glands appear unremarkable. Stomach/Bowel: Unremarkable Vascular/Lymphatic: Aortoiliac atherosclerotic vascular disease. Reproductive: Fatty mass along the right adnexa, stable, possibly a dermoid. This measures 5.8 cm in long axis today, unchanged. Other: No supplemental non-categorized findings. Musculoskeletal: Unremarkable IMPRESSION: 1. Progressive enlargement of the right posterior chest wall mass, now invading the pleural space through the eighth and ninth intercostal spaces. New pleural nodularity including a 2.5 by 2.0 cm right apical mass along the pleural effusion margin. Large malignant right pleural effusion. Stable appearance of early destruction of the right ninth rib inferiorly. 2. No findings of active malignancy in the abdomen or pelvis. 3. Improved but not resolved airspace opacity medially in the left lower lobe. The nodular component shown previously is no longer present. 4. Diffuse wall thickening of the urinary bladder wall suspicious for cystitis. 5. 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 Small type 1 hiatal hernia. Hypodense liver lesions are likely cysts. Right ovarian dermoid. Electronically Signed   By: Tevon Berhane Clines M.D.   On: 12/30/2017 14:59   US Thoracentesis Asp Pleural Space W/img Guide  Result Date: 01/07/2018 INDICATION: Patient with history of stage IV lung cancer, dyspnea, recurrent right pleural effusion. Request made for therapeutic right thoracentesis. EXAM: ULTRASOUND GUIDED THERAPEUTIC RIGHT THORACENTESIS MEDICATIONS: None COMPLICATIONS: None immediate. PROCEDURE: An ultrasound guided thoracentesis was thoroughly discussed with the patient and questions answered. The benefits, risks, alternatives and  complications were also discussed. The patient understands and wishes to proceed with the procedure. Written consent was obtained. Ultrasound was performed to localize and mark an adequate pocket of fluid in the right chest. The area was then prepped and draped in the normal sterile fashion. 1% Lidocaine was used for local anesthesia. Under ultrasound guidance a 6 Fr Safe-T-Centesis catheter was introduced. Thoracentesis was performed. The catheter was removed and a dressing applied. FINDINGS: A total of approximately 1.3 liters of yellow fluid was removed.  Due to patient coughing/chest discomfort only the above amount of fluid was removed today. IMPRESSION: Successful ultrasound guided therapeutic right thoracentesis yielding 1.3 liters of pleural fluid. Follow-up chest x-ray revealed no pneumothorax. Read by: Rowe Robert, PA-C Electronically Signed   By: Lucrezia Europe M.D.   On: 01/07/2018 12:09        This case was discussed with Dr. Julien Nordmann. He expressed agreement with my management of this patient.

## 2018-01-21 NOTE — Patient Instructions (Signed)

## 2018-01-22 LAB — TYPE AND SCREEN
ABO/RH(D): O POS
Antibody Screen: NEGATIVE
UNIT DIVISION: 0
Unit division: 0

## 2018-01-22 LAB — BPAM RBC
BLOOD PRODUCT EXPIRATION DATE: 201906012359
Blood Product Expiration Date: 201906012359
ISSUE DATE / TIME: 201905031023
ISSUE DATE / TIME: 201905031023
UNIT TYPE AND RH: 5100
Unit Type and Rh: 5100

## 2018-01-24 LAB — CULTURE, BLOOD (SINGLE)
Culture: NO GROWTH
SPECIAL REQUESTS: ADEQUATE

## 2018-02-02 ENCOUNTER — Encounter: Payer: Self-pay | Admitting: Nutrition

## 2018-02-02 ENCOUNTER — Inpatient Hospital Stay: Payer: Medicare Other

## 2018-02-02 ENCOUNTER — Encounter: Payer: Self-pay | Admitting: Oncology

## 2018-02-02 ENCOUNTER — Telehealth: Payer: Self-pay | Admitting: Oncology

## 2018-02-02 ENCOUNTER — Inpatient Hospital Stay (HOSPITAL_BASED_OUTPATIENT_CLINIC_OR_DEPARTMENT_OTHER): Payer: Medicare Other | Admitting: Oncology

## 2018-02-02 ENCOUNTER — Inpatient Hospital Stay: Payer: Medicare Other | Admitting: Nutrition

## 2018-02-02 VITALS — BP 101/86 | HR 115 | Temp 98.7°F | Resp 18 | Ht 68.0 in | Wt 188.1 lb

## 2018-02-02 DIAGNOSIS — Z452 Encounter for adjustment and management of vascular access device: Secondary | ICD-10-CM | POA: Diagnosis not present

## 2018-02-02 DIAGNOSIS — G629 Polyneuropathy, unspecified: Secondary | ICD-10-CM | POA: Diagnosis not present

## 2018-02-02 DIAGNOSIS — R112 Nausea with vomiting, unspecified: Secondary | ICD-10-CM

## 2018-02-02 DIAGNOSIS — Z5189 Encounter for other specified aftercare: Secondary | ICD-10-CM | POA: Diagnosis not present

## 2018-02-02 DIAGNOSIS — C7989 Secondary malignant neoplasm of other specified sites: Secondary | ICD-10-CM | POA: Diagnosis not present

## 2018-02-02 DIAGNOSIS — E860 Dehydration: Secondary | ICD-10-CM

## 2018-02-02 DIAGNOSIS — C3481 Malignant neoplasm of overlapping sites of right bronchus and lung: Secondary | ICD-10-CM

## 2018-02-02 DIAGNOSIS — C3491 Malignant neoplasm of unspecified part of right bronchus or lung: Secondary | ICD-10-CM

## 2018-02-02 DIAGNOSIS — E876 Hypokalemia: Secondary | ICD-10-CM

## 2018-02-02 DIAGNOSIS — R5383 Other fatigue: Secondary | ICD-10-CM

## 2018-02-02 DIAGNOSIS — Z5111 Encounter for antineoplastic chemotherapy: Secondary | ICD-10-CM

## 2018-02-02 DIAGNOSIS — D649 Anemia, unspecified: Secondary | ICD-10-CM | POA: Diagnosis not present

## 2018-02-02 LAB — CMP (CANCER CENTER ONLY)
ALBUMIN: 3 g/dL — AB (ref 3.5–5.0)
ALT: 28 U/L (ref 0–55)
ANION GAP: 9 (ref 3–11)
AST: 46 U/L — AB (ref 5–34)
Alkaline Phosphatase: 153 U/L — ABNORMAL HIGH (ref 40–150)
BILIRUBIN TOTAL: 0.6 mg/dL (ref 0.2–1.2)
BUN: 7 mg/dL (ref 7–26)
CHLORIDE: 103 mmol/L (ref 98–109)
CO2: 27 mmol/L (ref 22–29)
Calcium: 9.7 mg/dL (ref 8.4–10.4)
Creatinine: 0.76 mg/dL (ref 0.60–1.10)
GFR, Est AFR Am: >60 mL/min (ref >60)
GFR, Estimated: >60 mL/min (ref >60)
GLUCOSE: 106 mg/dL (ref 70–140)
Potassium: 3.2 mmol/L — ABNORMAL LOW (ref 3.5–5.1)
Sodium: 139 mmol/L (ref 136–145)
TOTAL PROTEIN: 7.7 g/dL (ref 6.4–8.3)

## 2018-02-02 LAB — CBC WITH DIFFERENTIAL (CANCER CENTER ONLY)
BASOS ABS: 0 10*3/uL (ref 0.0–0.1)
Basophils Relative: 1 %
Eosinophils Absolute: 0 10*3/uL (ref 0.0–0.5)
Eosinophils Relative: 0 %
HEMATOCRIT: 28.4 % — AB (ref 34.8–46.6)
Hemoglobin: 9.5 g/dL — ABNORMAL LOW (ref 11.6–15.9)
LYMPHS ABS: 0.5 10*3/uL — AB (ref 0.9–3.3)
LYMPHS PCT: 14 %
MCH: 32.1 pg (ref 25.1–34.0)
MCHC: 33.5 g/dL (ref 31.5–36.0)
MCV: 95.9 fL (ref 79.5–101.0)
Monocytes Absolute: 0.3 10*3/uL (ref 0.1–0.9)
Monocytes Relative: 9 %
NEUTROS ABS: 2.5 10*3/uL (ref 1.5–6.5)
Neutrophils Relative %: 76 %
Platelet Count: 74 10*3/uL — ABNORMAL LOW (ref 145–400)
RBC: 2.97 MIL/uL — AB (ref 3.70–5.45)
RDW: 18.1 % — ABNORMAL HIGH (ref 11.2–14.5)
WBC: 3.3 10*3/uL — AB (ref 3.9–10.3)

## 2018-02-02 MED ORDER — SODIUM CHLORIDE 0.9% FLUSH
10.0000 mL | INTRAVENOUS | Status: DC | PRN
  Administered 2018-02-02: 10 mL
  Filled 2018-02-02: qty 10

## 2018-02-02 MED ORDER — HEPARIN SOD (PORK) LOCK FLUSH 100 UNIT/ML IV SOLN
500.0000 [IU] | Freq: Once | INTRAVENOUS | Status: AC | PRN
  Administered 2018-02-02: 500 [IU]
  Filled 2018-02-02: qty 5

## 2018-02-02 NOTE — Telephone Encounter (Signed)
appt per 5/15 los - unable to schedule due to capped day - logged - will call patient when appt scheduled.

## 2018-02-02 NOTE — Progress Notes (Signed)
Patient's chemo was cancelled so nutrition follow up could not be completed.

## 2018-02-02 NOTE — Progress Notes (Signed)
[facility] OFFICE PROGRESS NOTE  [name], PA-C [place] [address]  DIAGNOSIS: Metastatic non-small cell lung cancer initially diagnosed as Stage IIIA (T2b, N2, M0) non-small cell lung cancer, invasive poorly differentiated squamous cell carcinoma diagnosed in November 2016 and presented with large right hilar mass with collapse of the right middle lobe and extension into the right upper lobe and right lower lobe along the hilum with right paratracheal and subcarinal lymphadenopathy. She presented in July 2018 with metastatic intramuscular mass in the right posterior lateral chest wall, with biopsy confirmed squamous cell carcinoma. PDL1 Expression: 50%.  PRIOR THERAPY: 1) Concurrent chemoradiation with weekly carboplatin for AUC of 2 and paclitaxel 45 MG/M2. First dose 08/19/2015. Status post 5 cycles. Last cycle was given 10/07/2015 with significant response. 2) Consolidation chemotherapy with carboplatin for AUC of 5 and paclitaxel 175 MG/M2 every 3 weeks with Neulasta support. First dose 11/18/2015. Status post 3 cycles. 3) palliative radiotherapy to the intramuscular mass in the right posterior lateral chest wall under the care of Dr. Tammi Klippel. 4) Ketruda 200 mg IV every 3 weeks. First dose 05/12/2017. Status post 3 cycles. This was discontinued secondary to disease progression. 5) Systemic chemotherapy with carboplatin for AUC of 5 and paclitaxel 175 mg/M2 every 3 weeks with Neulasta support. First dose August 26, 2017. Status post 6cycles.   CURRENT THERAPY: Gemzar 1000 mg/m day 1 and day 8 of 21-day cycle.  First dose 01/12/2018.  INTERVAL HISTORY: Theresa [name] 57 y.o. female returns for routine follow-up visit accompanied by her son.  The patient reports that she is feeling well and has no complaints except for ongoing right upper back pain and ongoing neuropathy.  She has been using oxycodone for her pain.  She uses this twice a day and overall  her pain is controlled.  She remains on gabapentin for her neuropathy.  The patient denies fevers and chills.  Denies chest pain, shortness of breath, cough, hemoptysis.  Denies nausea, vomiting, constipation, diarrhea.  She reports a fair appetite but she has lost weight since her last visit.  She tolerated her first cycle of chemotherapy fairly well with the exception of fatigue and dehydration.  The patient is here for evaluation prior to cycle 2 of her chemotherapy.  MEDICAL HISTORY: Past Medical History:  Diagnosis Date  . Cancer ([place])    5 years ago - cervical cancer  . Encounter for antineoplastic chemotherapy 08/27/2015  . History of radiation therapy 08/19/2015 - 10/07/2015   Site/dose:   The patient's primary tumor and involved lymph nodes were treated to 66 Gy in 30 fractions.  . Hypokalemia 09/30/2015  . Non-small cell carcinoma of lung, stage 3 ([place]) 08/08/2015  . Paroxysmal atrial flutter ([facility] [place]) 09/16/2015  . Pneumonia     ALLERGIES:  has No Known Allergies.  MEDICATIONS:  Current Outpatient Medications  Medication Sig Dispense Refill  . gabapentin (NEURONTIN) 300 MG capsule Take 1 capsule in the morning, 1 capsule in the afternoon, and 2 capsules at bedtime. 120 capsule 0  . ibuprofen (ADVIL,MOTRIN) 800 MG tablet Take 1 tablet by mouth every 8 (eight) hours as needed.    . lidocaine (LIDODERM) 5 % Place 1 patch onto the skin daily. Remove & Discard patch within 12 hours or as directed by MD 30 patch 0  . lidocaine-prilocaine (EMLA) cream Apply 1 application topically as needed. 30 g 2  . LORazepam (ATIVAN) 0.5 MG tablet 0.5 mg sublingual p.o. every 6 hours as needed  for nausea and/or vomiting. 40 tablet 0  . metoprolol tartrate (LOPRESSOR) 25 MG tablet Take 0.5 tablets (12.5 mg total) by mouth 2 (two) times daily. 90 tablet 1  . Multiple Vitamin (MULTIVITAMIN) capsule Take 1 capsule daily by mouth. 30 capsule 2  . ondansetron (ZOFRAN-ODT) 8 MG disintegrating tablet Take 1  tablet (8 mg total) by mouth every 8 (eight) hours as needed for nausea or vomiting. 20 tablet 1  . oxyCODONE (OXY IR/ROXICODONE) 5 MG immediate release tablet Take 1 tablet (5 mg total) by mouth every 6 (six) hours as needed for severe pain. 60 tablet 0  . potassium chloride SA (K-DUR,KLOR-CON) 20 MEQ tablet Take 2 tabs every morning and 1 tab every evening. 90 tablet 0  . prochlorperazine (COMPAZINE) 10 MG tablet Take 1 tablet (10 mg total) by mouth every 6 (six) hours as needed for nausea or vomiting. 30 tablet 1   Current Facility-Administered Medications  Medication Dose Route Frequency Provider Last Rate Last Dose  . sodium chloride flush (NS) 0.9 % injection 10 mL  10 mL Intracatheter PRN Curt Bears, MD   10 mL at 02/02/18 2620   Facility-Administered Medications Ordered in Other Visits  Medication Dose Route Frequency Provider Last Rate Last Dose  . 0.9 %  sodium chloride infusion   Intravenous Continuous Lucion Dilger R, NP      . 0.9 %  sodium chloride infusion   Intravenous Continuous Curt Bears, MD   Stopped at 05/31/17 1714  . ondansetron (ZOFRAN) injection 8 mg  8 mg Intravenous Once Curt Bears, MD        SURGICAL HISTORY:  Past Surgical History:  Procedure Laterality Date  . IR FLUORO GUIDE PORT INSERTION RIGHT  05/21/2017  . IR US GUIDE VASC ACCESS RIGHT  05/21/2017  . TUBAL LIGATION    . VIDEO BRONCHOSCOPY WITH ENDOBRONCHIAL ULTRASOUND N/A 08/02/2015   Procedure: VIDEO BRONCHOSCOPY WITH ENDOBRONCHIAL ULTRASOUND;  Surgeon: [name], MD;  Location: [facility];  Service: Thoracic;  Laterality: N/A;  . WISDOM TOOTH EXTRACTION      REVIEW OF SYSTEMS:   Review of Systems  Constitutional: Negative for appetite change, chills, fever. Positive for fatigue and weight loss. HENT:   Negative for mouth sores, nosebleeds, sore throat and trouble swallowing.   Eyes: Negative for eye problems and icterus.  Respiratory: Negative for cough, hemoptysis,  shortness of breath and wheezing.   Cardiovascular: Negative for chest pain and leg swelling.  Gastrointestinal: Negative for abdominal pain, constipation, diarrhea, nausea and vomiting.  Genitourinary: Negative for bladder incontinence, difficulty urinating, dysuria, frequency and hematuria.   Musculoskeletal: Negative for back pain, neck pain and neck stiffness.  Skin: Negative for itching and rash.  Neurological: Negative for dizziness, extremity weakness, headaches, light-headedness and seizures. Positive for peripheral neuropathy. Hematological: Negative for adenopathy. Does not bruise/bleed easily.  Psychiatric/Behavioral: Negative for confusion, depression and sleep disturbance. The patient is not nervous/anxious.     PHYSICAL EXAMINATION:  Blood pressure 101/86, pulse (!) 115, temperature 98.7 F (37.1 C), temperature source Oral, resp. rate 18, height '5\' 8"'  (1.727 m), weight 188 lb 1.6 oz (85.3 kg), SpO2 98 %.  ECOG PERFORMANCE STATUS: 1 - Symptomatic but completely ambulatory  Physical Exam  Constitutional: Oriented to person, place, and time and well-developed, well-nourished, and in no distress. No distress.  HENT:  Head: Normocephalic and atraumatic.  Mouth/Throat: Oropharynx is clear and moist. No oropharyngeal exudate.  Eyes: Conjunctivae are normal. Right eye exhibits no discharge. Left eye exhibits  no discharge. No scleral icterus.  Neck: Normal range of motion. Neck supple.  Cardiovascular: Normal rate, regular rhythm, normal heart sounds and intact distal pulses.   Pulmonary/Chest: Effort normal and breath sounds normal. No respiratory distress. No wheezes. No rales.  Abdominal: Soft. Bowel sounds are normal. Exhibits no distension and no mass. There is no tenderness.  Musculoskeletal: Normal range of motion. Exhibits no edema.  Lymphadenopathy:    No cervical adenopathy.  Neurological: Alert and oriented to person, place, and time. Exhibits normal muscle tone. Gait  normal. Coordination normal.  Skin: Skin is warm and dry. No rash noted. Not diaphoretic. No erythema. No pallor.  Psychiatric: Mood, memory and judgment normal.  Vitals reviewed.  LABORATORY DATA: Lab Results  Component Value Date   WBC 3.3 (L) 02/02/2018   HGB 9.5 (L) 02/02/2018   HCT 28.4 (L) 02/02/2018   MCV 95.9 02/02/2018   PLT 74 (L) 02/02/2018      Chemistry      Component Value Date/Time   NA 139 02/02/2018 0850   NA 138 09/24/2017 1600   K 3.2 (L) 02/02/2018 0850   K 3.4 (L) 09/24/2017 1600   CL 103 02/02/2018 0850   CO2 27 02/02/2018 0850   CO2 25 09/24/2017 1600   BUN 7 02/02/2018 0850   BUN 9.5 09/24/2017 1600   CREATININE 0.76 02/02/2018 0850   CREATININE 0.8 09/24/2017 1600      Component Value Date/Time   CALCIUM 9.7 02/02/2018 0850   CALCIUM 9.0 09/24/2017 1600   ALKPHOS 153 (H) 02/02/2018 0850   ALKPHOS 92 09/24/2017 1600   AST 46 (H) 02/02/2018 0850   AST 31 09/24/2017 1600   ALT 28 02/02/2018 0850   ALT 24 09/24/2017 1600   BILITOT 0.6 02/02/2018 0850   BILITOT 0.40 09/24/2017 1600       RADIOGRAPHIC STUDIES:  Dg Chest 1 View  Result Date: 01/07/2018 CLINICAL DATA:  Shortness of breath following right thoracentesis. EXAM: CHEST  1 VIEW COMPARISON:  One-view chest x-ray 11/05/2017. CT of the chest 12/30/2017. FINDINGS: Right pleural effusion is similar in volume to the previous post thoracentesis radiograph. There is no pneumothorax. Loculation of fluid is again suspected. A right IJ Port-A-Cath is in place. Left lung is clear. IMPRESSION: 1. Residual moderate right pleural effusion following thoracentesis. 2. No pneumothorax or radiographic evidence for complication. Electronically Signed   By: San Morelle M.D.   On: 01/07/2018 11:52   US Thoracentesis Asp Pleural Space W/img Guide  Result Date: 01/07/2018 INDICATION: Patient with history of stage IV lung cancer, dyspnea, recurrent right pleural effusion. Request made for therapeutic  right thoracentesis. EXAM: ULTRASOUND GUIDED THERAPEUTIC RIGHT THORACENTESIS MEDICATIONS: None COMPLICATIONS: None immediate. PROCEDURE: An ultrasound guided thoracentesis was thoroughly discussed with the patient and questions answered. The benefits, risks, alternatives and complications were also discussed. The patient understands and wishes to proceed with the procedure. Written consent was obtained. Ultrasound was performed to localize and mark an adequate pocket of fluid in the right chest. The area was then prepped and draped in the normal sterile fashion. 1% Lidocaine was used for local anesthesia. Under ultrasound guidance a 6 Fr Safe-T-Centesis catheter was introduced. Thoracentesis was performed. The catheter was removed and a dressing applied. FINDINGS: A total of approximately 1.3 liters of yellow fluid was removed. Due to patient coughing/chest discomfort only the above amount of fluid was removed today. IMPRESSION: Successful ultrasound guided therapeutic right thoracentesis yielding 1.3 liters of pleural fluid. Follow-up chest x-ray revealed  no pneumothorax. Read by: Rowe Robert, PA-C Electronically Signed   By: Lucrezia Europe M.D.   On: 01/07/2018 12:09     ASSESSMENT/PLAN:  Metastatic primary lung cancer, right [facility]) This is a very pleasant 57 year old African-American female with metastatic non-small cell lung cancer that was initially diagnosed as stage IIIAnon-small cell lung cancer, squamous cell carcinoma presented with large right hilar mass and mediastinal lymphadenopathy diagnosed in November 2016. She status post concurrent chemoradiation followed by consolidation chemotherapy. The patient was found recently to have intramuscular metastases and the right posterior chest wall that was biopsy-proven to be squamous cell carcinoma with PDL 1 expression 50%. The patient was a started on treatment with immunotherapy with Ketruda (pembrolizumab) status post 3 cycles. Unfortunately imaging  studies after the treatment with Keytruda showed evidence for disease progression. The patient is currently then received treatment with carboplatin for AUC of 5 and paclitaxel 175 mg/M2 status post6cycles. She  tolerated this treatment very well especially after reducing the dose of paclitaxel to 150 mg/M2 starting from cycle #2. She had a recent restaging CT scan of the chest, abdomen, pelvis which showed evidence of disease progression.   The patient is now on of single agent Gemzar given day 1 and day 8 of a 21-day cycle.    Status post 1 cycle.  Tolerated the first cycle fairly well with the exception of fatigue, anemia, and dehydration.  Patient was seen with Dr. Earlie Server.  Labs been reviewed and she has thrombocytopenia today.  Her platelet count is 74,000.  Chemotherapy will be delayed by 1 week.  If her platelet count is 100,000 next week, will start cycle 2. She will follow-up in 4 weeks for evaluation prior to cycle #3.  For her pain, she will continue oxycodone.  For the peripheral neuropathy,  the patient will continue on Neurontin200 mg p.o. 3 times daily.  The patient's potassium level is low today at 3.2.  She has been taking K-Dur 20 mEq 2 tablets in the morning and 1 tablet in the evening.    The patient was instructed to increase her potassium to 2 tablets in the morning and 2 tablets in the evening.  Repeat CMET as scheduled for next week.  The patient was advised to call immediately if she has any concerning symptoms in the interval. The patient voices understanding of current disease status and treatment options and is in agreement with the current care plan. All questions were answered. The patient knows to call the clinic with any problems, questions or concerns. We can certainly see the patient much sooner if necessary.   No orders of the defined types were placed in this encounter.  Mikey Bussing, DNP, AGPCNP-BC, AOCNP 02/02/18  ADDENDUM: Hematology/Oncology  Attending: I had a face-to-face encounter with the patient today.  I recommended her care plan.  This is a very pleasant 57 years old African-American female with metastatic non-small cell lung cancer, squamous cell carcinoma status post several chemotherapy and immunotherapy regimens.  She is currently undergoing treatment with single agent gemcitabine status post 1 cycle.  She tolerated the first cycle of this treatment well with no concerning complaints.  Unfortunately her platelets count are low today at 74,000. I recommended for the patient to delay the start of cycle number 2 x 1 week until improvement of her platelets count. We will see her back for follow-up visit in 4 weeks for evaluation with the start of cycle #3. She was advised to call immediately if  she has any concerning symptoms in the interval.  Disclaimer: This note was dictated with voice recognition software. Similar sounding words can inadvertently be transcribed and may be missed upon review. Eilleen Kempf, MD 02/02/18

## 2018-02-02 NOTE — Assessment & Plan Note (Signed)
This is a very pleasant 57 year old African-American female with metastatic non-small cell lung cancer that was initially diagnosed as stage IIIAnon-small cell lung cancer, squamous cell carcinoma presented with large right hilar mass and mediastinal lymphadenopathy diagnosed in November 2016. She status post concurrent chemoradiation followed by consolidation chemotherapy. The patient was found recently to have intramuscular metastases and the right posterior chest wall that was biopsy-proven to be squamous cell carcinoma with PDL 1 expression 50%. The patient was a started on treatment with immunotherapy with Ketruda (pembrolizumab) status post 3 cycles. Unfortunately imaging studies after the treatment with Keytruda showed evidence for disease progression. The patient is currently then received treatment with carboplatin for AUC of 5 and paclitaxel 175 mg/M2 status post6cycles. She  tolerated this treatment very well especially after reducing the dose of paclitaxel to 150 mg/M2 starting from cycle #2. She had a recent restaging CT scan of the chest, abdomen, pelvis which showed evidence of disease progression.   The patient is now on of single agent Gemzar given day 1 and day 8 of a 21-day cycle.    Status post 1 cycle.  Tolerated the first cycle fairly well with the exception of fatigue, anemia, and dehydration.  Patient was seen with Dr. Earlie Server.  Labs been reviewed and she has thrombocytopenia today.  Her platelet count is 74,000.  Chemotherapy will be delayed by 1 week.  If her platelet count is 100,000 next week, will start cycle 2. She will follow-up in 4 weeks for evaluation prior to cycle #3.  For her pain, she will continue oxycodone.  For the peripheral neuropathy,  the patient will continue on Neurontin200 mg p.o. 3 times daily.  The patient's potassium level is low today at 3.2.  She has been taking K-Dur 20 mEq 2 tablets in the morning and 1 tablet in the evening.    The  patient was instructed to increase her potassium to 2 tablets in the morning and 2 tablets in the evening.  Repeat CMET as scheduled for next week.  The patient was advised to call immediately if she has any concerning symptoms in the interval. The patient voices understanding of current disease status and treatment options and is in agreement with the current care plan. All questions were answered. The patient knows to call the clinic with any problems, questions or concerns. We can certainly see the patient much sooner if necessary.

## 2018-02-04 ENCOUNTER — Telehealth: Payer: Self-pay | Admitting: Oncology

## 2018-02-04 NOTE — Telephone Encounter (Signed)
Scheduled apt per 5/15 los - pt to get an updated schedule next visit

## 2018-02-07 ENCOUNTER — Other Ambulatory Visit: Payer: Self-pay | Admitting: Medical Oncology

## 2018-02-07 DIAGNOSIS — C3491 Malignant neoplasm of unspecified part of right bronchus or lung: Secondary | ICD-10-CM

## 2018-02-07 DIAGNOSIS — R112 Nausea with vomiting, unspecified: Secondary | ICD-10-CM

## 2018-02-07 MED ORDER — OXYCODONE HCL 5 MG PO TABS: 5.0000 mg | ORAL_TABLET | Freq: Four times a day (QID) | ORAL | 0 refills | Status: DC | PRN

## 2018-02-07 MED ORDER — ONDANSETRON 8 MG PO TBDP: 8.0000 mg | ORAL_TABLET | Freq: Three times a day (TID) | ORAL | 1 refills | Status: DC | PRN

## 2018-02-09 ENCOUNTER — Inpatient Hospital Stay: Payer: Medicare Other

## 2018-02-09 ENCOUNTER — Encounter: Payer: Self-pay | Admitting: *Deleted

## 2018-02-09 VITALS — BP 104/77 | HR 108 | Temp 98.3°F | Resp 18

## 2018-02-09 DIAGNOSIS — Z5189 Encounter for other specified aftercare: Secondary | ICD-10-CM | POA: Diagnosis not present

## 2018-02-09 DIAGNOSIS — E860 Dehydration: Secondary | ICD-10-CM | POA: Diagnosis not present

## 2018-02-09 DIAGNOSIS — C3491 Malignant neoplasm of unspecified part of right bronchus or lung: Secondary | ICD-10-CM

## 2018-02-09 DIAGNOSIS — Z5111 Encounter for antineoplastic chemotherapy: Secondary | ICD-10-CM | POA: Diagnosis not present

## 2018-02-09 DIAGNOSIS — C3481 Malignant neoplasm of overlapping sites of right bronchus and lung: Secondary | ICD-10-CM | POA: Diagnosis not present

## 2018-02-09 DIAGNOSIS — C7989 Secondary malignant neoplasm of other specified sites: Secondary | ICD-10-CM | POA: Diagnosis not present

## 2018-02-09 DIAGNOSIS — Z452 Encounter for adjustment and management of vascular access device: Secondary | ICD-10-CM | POA: Diagnosis not present

## 2018-02-09 DIAGNOSIS — Z95828 Presence of other vascular implants and grafts: Secondary | ICD-10-CM

## 2018-02-09 LAB — CBC WITH DIFFERENTIAL (CANCER CENTER ONLY)
BASOS ABS: 0 10*3/uL (ref 0.0–0.1)
BASOS PCT: 0 %
Eosinophils Absolute: 0 10*3/uL (ref 0.0–0.5)
Eosinophils Relative: 0 %
HEMATOCRIT: 28.4 % — AB (ref 34.8–46.6)
Hemoglobin: 9.5 g/dL — ABNORMAL LOW (ref 11.6–15.9)
Lymphocytes Relative: 12 %
Lymphs Abs: 0.6 10*3/uL — ABNORMAL LOW (ref 0.9–3.3)
MCH: 32.6 pg (ref 25.1–34.0)
MCHC: 33.5 g/dL (ref 31.5–36.0)
MCV: 97.5 fL (ref 79.5–101.0)
MONO ABS: 0.6 10*3/uL (ref 0.1–0.9)
Monocytes Relative: 13 %
NEUTROS ABS: 3.6 10*3/uL (ref 1.5–6.5)
NEUTROS PCT: 75 %
PLATELETS: 137 10*3/uL — AB (ref 145–400)
RBC: 2.92 MIL/uL — ABNORMAL LOW (ref 3.70–5.45)
RDW: 20.5 % — ABNORMAL HIGH (ref 11.2–14.5)
WBC Count: 4.8 10*3/uL (ref 3.9–10.3)

## 2018-02-09 LAB — CMP (CANCER CENTER ONLY)
ALBUMIN: 3.1 g/dL — AB (ref 3.5–5.0)
ALT: 23 U/L (ref 0–55)
AST: 49 U/L — AB (ref 5–34)
Alkaline Phosphatase: 130 U/L (ref 40–150)
Anion gap: 11 (ref 3–11)
BILIRUBIN TOTAL: 0.5 mg/dL (ref 0.2–1.2)
BUN: 7 mg/dL (ref 7–26)
CHLORIDE: 103 mmol/L (ref 98–109)
CO2: 25 mmol/L (ref 22–29)
Calcium: 9.6 mg/dL (ref 8.4–10.4)
Creatinine: 0.75 mg/dL (ref 0.60–1.10)
GFR, Est AFR Am: >60 mL/min (ref >60)
GFR, Estimated: >60 mL/min (ref >60)
GLUCOSE: 128 mg/dL (ref 70–140)
Potassium: 3.2 mmol/L — ABNORMAL LOW (ref 3.5–5.1)
SODIUM: 139 mmol/L (ref 136–145)
Total Protein: 7.7 g/dL (ref 6.4–8.3)

## 2018-02-09 MED ORDER — SODIUM CHLORIDE 0.9 % IV SOLN
Freq: Once | INTRAVENOUS | Status: AC
  Administered 2018-02-09: 10:00:00 via INTRAVENOUS

## 2018-02-09 MED ORDER — SODIUM CHLORIDE 0.9% FLUSH
10.0000 mL | INTRAVENOUS | Status: DC | PRN
  Administered 2018-02-09: 10 mL via INTRAVENOUS
  Filled 2018-02-09: qty 10

## 2018-02-09 MED ORDER — HEPARIN SOD (PORK) LOCK FLUSH 100 UNIT/ML IV SOLN
500.0000 [IU] | Freq: Once | INTRAVENOUS | Status: AC | PRN
  Administered 2018-02-09: 500 [IU]
  Filled 2018-02-09: qty 5

## 2018-02-09 MED ORDER — SODIUM CHLORIDE 0.9% FLUSH
10.0000 mL | INTRAVENOUS | Status: DC | PRN
  Administered 2018-02-09: 10 mL
  Filled 2018-02-09: qty 10

## 2018-02-09 MED ORDER — PROCHLORPERAZINE MALEATE 10 MG PO TABS
ORAL_TABLET | ORAL | Status: AC
  Filled 2018-02-09: qty 1

## 2018-02-09 MED ORDER — PROCHLORPERAZINE MALEATE 10 MG PO TABS
10.0000 mg | ORAL_TABLET | Freq: Once | ORAL | Status: AC
  Administered 2018-02-09: 10 mg via ORAL

## 2018-02-09 MED ORDER — SODIUM CHLORIDE 0.9 % IV SOLN
2000.0000 mg | Freq: Once | INTRAVENOUS | Status: AC
  Administered 2018-02-09: 2000 mg via INTRAVENOUS
  Filled 2018-02-09: qty 52.6

## 2018-02-09 NOTE — Patient Instructions (Signed)
[facility] °Discharge Instructions for Patients Receiving Chemotherapy ° °Today you received the following chemotherapy agents Gemzar ° °To help prevent nausea and vomiting after your treatment, we encourage you to take your nausea medication as directed. °  °If you develop nausea and vomiting that is not controlled by your nausea medication, call the clinic.  ° °BELOW ARE SYMPTOMS THAT SHOULD BE REPORTED IMMEDIATELY: °· *FEVER GREATER THAN 100.5 F °· *CHILLS WITH OR WITHOUT FEVER °· NAUSEA AND VOMITING THAT IS NOT CONTROLLED WITH YOUR NAUSEA MEDICATION °· *UNUSUAL SHORTNESS OF BREATH °· *UNUSUAL BRUISING OR BLEEDING °· TENDERNESS IN MOUTH AND THROAT WITH OR WITHOUT PRESENCE OF ULCERS °· *URINARY PROBLEMS °· *BOWEL PROBLEMS °· UNUSUAL RASH °Items with * indicate a potential emergency and should be followed up as soon as possible. ° °Feel free to call the clinic should you have any questions or concerns. The clinic phone number is [phone]. ° °Please show the CHEMO ALERT CARD at check-in to the Emergency Department and triage nurse. ° ° °

## 2018-02-09 NOTE — Progress Notes (Signed)
SWOG S1400 Study: 6 Month Follow Up Research nurse met with patient in infusion area as she was receiving cycle #2 Gemcitabine therapy. She reports doing well, with no problems except counts getting low and some fatigue. She expressed appreciation that research nurse came to check on her. Susan L. Coward, RN, BSN Clinical Research Nurse  

## 2018-02-11 ENCOUNTER — Telehealth: Payer: Self-pay | Admitting: *Deleted

## 2018-02-11 NOTE — Telephone Encounter (Signed)
"  I would like dr. Julien Nordmann to schedule to have fluid drawn off my lungs.  When I lie on my right side I get dizzy.  I think it's from fluid on my lung.  No shortness of breath or any other symptoms, just dizzy when on right side.  I know you all are closed Monday.  I'm okay.  I know to go to ED if any changes."  This nurse spoke with collaborative assigned to patient's provider about patient's request.

## 2018-02-17 ENCOUNTER — Inpatient Hospital Stay: Payer: Medicare Other | Admitting: Nutrition

## 2018-02-17 ENCOUNTER — Inpatient Hospital Stay: Payer: Medicare Other

## 2018-02-17 ENCOUNTER — Inpatient Hospital Stay (HOSPITAL_BASED_OUTPATIENT_CLINIC_OR_DEPARTMENT_OTHER): Payer: Medicare Other | Admitting: Medical

## 2018-02-17 VITALS — BP 108/76 | HR 105 | Temp 98.4°F | Resp 18

## 2018-02-17 DIAGNOSIS — E860 Dehydration: Secondary | ICD-10-CM | POA: Diagnosis not present

## 2018-02-17 DIAGNOSIS — Z452 Encounter for adjustment and management of vascular access device: Secondary | ICD-10-CM | POA: Diagnosis not present

## 2018-02-17 DIAGNOSIS — C3491 Malignant neoplasm of unspecified part of right bronchus or lung: Secondary | ICD-10-CM

## 2018-02-17 DIAGNOSIS — Z5189 Encounter for other specified aftercare: Secondary | ICD-10-CM | POA: Diagnosis not present

## 2018-02-17 DIAGNOSIS — Z5111 Encounter for antineoplastic chemotherapy: Secondary | ICD-10-CM | POA: Diagnosis not present

## 2018-02-17 DIAGNOSIS — C3481 Malignant neoplasm of overlapping sites of right bronchus and lung: Secondary | ICD-10-CM | POA: Diagnosis not present

## 2018-02-17 DIAGNOSIS — C7989 Secondary malignant neoplasm of other specified sites: Secondary | ICD-10-CM | POA: Diagnosis not present

## 2018-02-17 DIAGNOSIS — R6883 Chills (without fever): Secondary | ICD-10-CM

## 2018-02-17 DIAGNOSIS — R112 Nausea with vomiting, unspecified: Secondary | ICD-10-CM

## 2018-02-17 LAB — CBC WITH DIFFERENTIAL (CANCER CENTER ONLY)
BASOS ABS: 0 10*3/uL (ref 0.0–0.1)
BASOS PCT: 0 %
Eosinophils Absolute: 0 10*3/uL (ref 0.0–0.5)
Eosinophils Relative: 0 %
HEMATOCRIT: 26.2 % — AB (ref 34.8–46.6)
Hemoglobin: 8.8 g/dL — ABNORMAL LOW (ref 11.6–15.9)
Lymphocytes Relative: 16 %
Lymphs Abs: 0.6 10*3/uL — ABNORMAL LOW (ref 0.9–3.3)
MCH: 32.2 pg (ref 25.1–34.0)
MCHC: 33.6 g/dL (ref 31.5–36.0)
MCV: 96 fL (ref 79.5–101.0)
Monocytes Absolute: 0.4 10*3/uL (ref 0.1–0.9)
Monocytes Relative: 10 %
NEUTROS ABS: 2.9 10*3/uL (ref 1.5–6.5)
NEUTROS PCT: 74 %
Platelet Count: 79 10*3/uL — ABNORMAL LOW (ref 145–400)
RBC: 2.73 MIL/uL — AB (ref 3.70–5.45)
RDW: 18.2 % — ABNORMAL HIGH (ref 11.2–14.5)
WBC Count: 3.9 10*3/uL (ref 3.9–10.3)

## 2018-02-17 LAB — CMP (CANCER CENTER ONLY)
ALK PHOS: 140 U/L (ref 40–150)
ALT: 31 U/L (ref 0–55)
ANION GAP: 9 (ref 3–11)
AST: 48 U/L — ABNORMAL HIGH (ref 5–34)
Albumin: 3.1 g/dL — ABNORMAL LOW (ref 3.5–5.0)
BILIRUBIN TOTAL: 0.5 mg/dL (ref 0.2–1.2)
BUN: 6 mg/dL — ABNORMAL LOW (ref 7–26)
CALCIUM: 9.5 mg/dL (ref 8.4–10.4)
CO2: 27 mmol/L (ref 22–29)
Chloride: 103 mmol/L (ref 98–109)
Creatinine: 0.72 mg/dL (ref 0.60–1.10)
GFR, Estimated: >60 mL/min (ref >60)
Glucose, Bld: 108 mg/dL (ref 70–140)
Potassium: 3.3 mmol/L — ABNORMAL LOW (ref 3.5–5.1)
SODIUM: 139 mmol/L (ref 136–145)
TOTAL PROTEIN: 7.8 g/dL (ref 6.4–8.3)

## 2018-02-17 MED ORDER — SODIUM CHLORIDE 0.9% FLUSH
10.0000 mL | INTRAVENOUS | Status: DC | PRN
  Administered 2018-02-17: 10 mL
  Filled 2018-02-17: qty 10

## 2018-02-17 MED ORDER — HEPARIN SOD (PORK) LOCK FLUSH 100 UNIT/ML IV SOLN
500.0000 [IU] | Freq: Once | INTRAVENOUS | Status: AC | PRN
  Administered 2018-02-17: 500 [IU]
  Filled 2018-02-17: qty 5

## 2018-02-17 NOTE — Progress Notes (Signed)
Nutrition follow-up completed with patient in infusion room for non-small cell lung cancer. Patient's treatment was canceled today. Current weight is documented as 188 pounds documented on May 15. This is decreased from 207 pounds in October 2018. Patient denies nutrition impact symptoms. RN reports patient's potassium is low and she requires education.  Nutrition diagnosis: Food and nutrition related knowledge deficit related to non-small cell lung cancer and associated treatments as evidenced by no prior need for nutrition related information.  Intervention: Patient was educated on foods that contain increased potassium.  She was given a fact sheet by nursing. Recommended patient attempt to increase protein in her diet and reviewed high-protein foods. Offered patient oral nutrition supplements but she refused. Patient did accept coupons.  Monitoring, evaluation, goals: Patient will work to increase calories and protein to minimize further weight loss.  Next visit: Patient will contact me for questions or concerns.  **Disclaimer: This note was dictated with voice recognition software. Similar sounding words can inadvertently be transcribed and this note may contain transcription errors which may not have been corrected upon publication of note.**

## 2018-02-17 NOTE — Progress Notes (Signed)
Pt platelet count at 79 today and potassium 3.2. Per Dr.Mohamed, hold treatment today and follow up in 2 weeks. Pt already scheduled to come back on June 12th. Pt instructed to increase food intake, rich in potassium and foods that help increase platelet count.   Per Dr.Mohamed, increase pt potassium to for today only and increase oral hydration at home. Pt and son both aware of these instructions.   Notified pharmacy of pt treatment being cancelled today.

## 2018-02-18 LAB — URINE CULTURE

## 2018-02-22 ENCOUNTER — Other Ambulatory Visit: Payer: Self-pay | Admitting: *Deleted

## 2018-02-22 ENCOUNTER — Telehealth: Payer: Self-pay | Admitting: *Deleted

## 2018-02-22 DIAGNOSIS — C342 Malignant neoplasm of middle lobe, bronchus or lung: Secondary | ICD-10-CM

## 2018-02-22 DIAGNOSIS — C3491 Malignant neoplasm of unspecified part of right bronchus or lung: Secondary | ICD-10-CM

## 2018-02-22 MED ORDER — OXYCODONE HCL 5 MG PO TABS: 5.0000 mg | ORAL_TABLET | Freq: Four times a day (QID) | ORAL | 0 refills | Status: DC | PRN

## 2018-02-22 NOTE — Progress Notes (Signed)
Order entered for Thoracentesis

## 2018-02-22 NOTE — Telephone Encounter (Signed)
"  Calling again, awaiting return call about  about fluids.  Someone please call me back at [phone] in reference to fluid being removed and my pain pill I have to get.  Pain keeps coming back."

## 2018-02-22 NOTE — Telephone Encounter (Signed)
"  No shortness of breath but my right side chest feels heavy, uncomfortable.  Need to have fluid drained today.  I also need refill on pain medication today."

## 2018-02-22 NOTE — Telephone Encounter (Signed)
Thoracentesis order entered. I got a treatment and I didn't have to come back in from the [facility], I did really good. It's really heavy on my right side, I have to lay down because i'm in so much pain I just took a pain pill so it's going to be better" Appt for 6/5 130pm for thoracentesis. Pt notified.

## 2018-02-23 ENCOUNTER — Other Ambulatory Visit: Payer: Medicare Other

## 2018-02-23 ENCOUNTER — Encounter: Payer: Medicare Other | Admitting: Nutrition

## 2018-02-23 ENCOUNTER — Ambulatory Visit (HOSPITAL_COMMUNITY)
Admission: RE | Admit: 2018-02-23 | Discharge: 2018-02-23 | Disposition: A | Discharge status: Home / Self Care | Payer: Medicare Other | Source: Ambulatory Visit | Attending: Oncology | Admitting: Oncology

## 2018-02-23 ENCOUNTER — Ambulatory Visit (HOSPITAL_COMMUNITY)
Admission: RE | Admit: 2018-02-23 | Discharge: 2018-02-23 | Disposition: A | Discharge status: Home / Self Care | Payer: Medicare Other | Source: Ambulatory Visit | Attending: Radiology | Admitting: Radiology

## 2018-02-23 ENCOUNTER — Ambulatory Visit: Payer: Medicare Other

## 2018-02-23 ENCOUNTER — Ambulatory Visit: Payer: Medicare Other | Admitting: Oncology

## 2018-02-23 DIAGNOSIS — R0602 Shortness of breath: Secondary | ICD-10-CM | POA: Diagnosis not present

## 2018-02-23 DIAGNOSIS — C342 Malignant neoplasm of middle lobe, bronchus or lung: Secondary | ICD-10-CM | POA: Diagnosis not present

## 2018-02-23 DIAGNOSIS — Z9889 Other specified postprocedural states: Secondary | ICD-10-CM | POA: Insufficient documentation

## 2018-02-23 DIAGNOSIS — J90 Pleural effusion, not elsewhere classified: Secondary | ICD-10-CM | POA: Diagnosis not present

## 2018-02-23 MED ORDER — LIDOCAINE HCL 1 % IJ SOLN
INTRAMUSCULAR | Status: AC
  Filled 2018-02-23: qty 20

## 2018-02-23 NOTE — Progress Notes (Signed)
These preliminary result these preliminary results were noted.  Awaiting final report.

## 2018-02-23 NOTE — Procedures (Signed)
Ultrasound-guided  therapeutic right thoracentesis performed yielding 800 cc of yellow  fluid. No immediate complications. Follow-up chest x-ray pending. Due to pt coughing/chest discomfort only the above amount of fluid was removed today.

## 2018-02-24 ENCOUNTER — Other Ambulatory Visit: Payer: Self-pay | Admitting: Medical Oncology

## 2018-02-24 DIAGNOSIS — Z95828 Presence of other vascular implants and grafts: Secondary | ICD-10-CM

## 2018-02-24 DIAGNOSIS — C3490 Malignant neoplasm of unspecified part of unspecified bronchus or lung: Secondary | ICD-10-CM | POA: Diagnosis not present

## 2018-02-24 DIAGNOSIS — D696 Thrombocytopenia, unspecified: Secondary | ICD-10-CM | POA: Diagnosis not present

## 2018-02-24 DIAGNOSIS — R000 Tachycardia, unspecified: Secondary | ICD-10-CM | POA: Diagnosis not present

## 2018-02-24 DIAGNOSIS — D649 Anemia, unspecified: Secondary | ICD-10-CM | POA: Diagnosis not present

## 2018-02-24 MED ORDER — LIDOCAINE-PRILOCAINE 2.5-2.5 % EX CREA: 1.0000 "application " | TOPICAL_CREAM | CUTANEOUS | 2 refills | Status: DC | PRN

## 2018-03-01 NOTE — Progress Notes (Signed)
Labs only

## 2018-03-01 NOTE — Progress Notes (Addendum)
[place]  [phone] [phone]  Clinic Follow up Note   Patient Care Team: [name], PA-C as PCP - General (Urgent Care) Curt Bears, MD as Consulting Physician (Oncology) 03/02/2018  SUMMARY OF ONCOLOGIC HISTORY: Oncology History   Patient presented in 05/2015 with cold like symptoms to ED.  Work up showed right lung mass.  Non-small cell carcinoma of lung, stage 3 (HCC)   Staging form: Lung, AJCC 7th Edition     Clinical stage from 08/08/2015: Stage IIIA (T2b, N2, M0) - Signed by Curt Bears, MD on 08/08/2015       Non-small cell carcinoma of lung, stage 3 ([place])   07/06/2015 Imaging    CXR IMPRESSION: Right middle lobe consolidation and an element of volume loss. Underlying malignancy cannot be excluded      07/12/2015 Imaging    CTA IMPRESSION: No evidence of pulmonary embolism.  6.1 x 4.1 cm posterior right upper lobe mass extending to the right middle lobe and right hilar region. Associated postobstructive opacity in the right upper and middle lobes.       07/22/2015 Imaging    CT Neck IMPRESSION: 1. Slight prominence of the tonsils without an abscess or mass. 2. No adenopathy in the neck.      08/02/2015 Surgery    PROCEDURE:  Bronchoscopy with brushings and biopsies Endobronchial ultrasound with mediastinal lymph node aspirations.       08/02/2015 Pathology Results    Bronchus, biopsy, right upper lobe - INVASIVE POORLY DIFFERENTIATED SQUAMOUS CELL CARCINOMA      08/07/2015 Imaging    PET IMPRESSION:  6 cm right hilar mass with collapse of the right middle lobe and probable extension of mass into the right upper lobe and right lower lobe along the hilum, maximum standard uptake value 22.9, compatible with malignancy.       08/07/2015 Imaging    MRI Brain IMPRESSION: Mild atrophy. Minor white matter disease, nonspecific.  No acute intracranial findings.      08/08/2015 Initial Diagnosis    Non-small  cell carcinoma of lung, stage 3 ([place])      08/09/2015 -  Radiation Therapy    SIM      08/19/2015 -  Chemotherapy    1st chemo      09/16/2015 Imaging    CTA IMPRESSION: 1. Negative for acute pulmonary embolus, pneumonia or other acute cardiopulmonary process. 2. Interval response to therapy with significantly decreased size of medial right upper lobe/right perihilar pulmonary mass       09/08/2017 Genetic Testing    Patient has genetic testing done by [facility] Results revealed patient has the following mutation(s): Was negative for c-Met (H-score <150)-no printed report avaliable       Metastatic primary lung cancer, right ([place])   05/03/2017 Initial Diagnosis    Metastatic primary lung cancer, right ([place])      01/12/2018 -  Chemotherapy    The patient had gemcitabine (GEMZAR) 2,000 mg in sodium chloride 0.9 % 250 mL chemo infusion, 2,052 mg, Intravenous,  Once, 3 of 6 cycles Administration: 2,000 mg (01/12/2018), 2,000 mg (01/18/2018), 2,000 mg (02/09/2018)  for chemotherapy treatment.      DIAGNOSIS: Metastatic non-small cell lung cancer initially diagnosed as Stage IIIA (T2b, N2, M0) non-small cell lung cancer, invasive poorly differentiated squamous cell carcinoma diagnosed in November 2016 and presented with large right hilar mass with collapse of the right middle lobe and extension into the right upper lobe and right lower  lobe along the hilum with right paratracheal and subcarinal lymphadenopathy. She presented in July 2018 with metastatic intramuscular mass in the right posterior lateral chest wall, with biopsy confirmed squamous cell carcinoma.  MOLECULAR STUDIES PDL1 Expression: 50%.  PRIOR THERAPY: 1) Concurrent chemoradiation with weekly carboplatin for AUC of 2 and paclitaxel 45 MG/M2. First dose 08/19/2015. Status post 5 cycles. Last cycle was given 10/07/2015 with significant response. 2) Consolidation chemotherapy with carboplatin for AUC of 5 and  paclitaxel 175 MG/M2 every 3 weeks with Neulasta support. First dose 11/18/2015. Status post 3 cycles. 3) palliative radiotherapy to the intramuscular mass in the right posterior lateral chest wall under the care of Dr. Tammi Klippel. 4) Ketruda 200 mg IV every 3 weeks. First dose 05/12/2017. Status post 3 cycles. This was discontinued secondary to disease progression. 5)Systemic chemotherapy with carboplatin for AUC of 5 and paclitaxel 175 mg/M2 every 3 weeks with Neulasta support. First dose August 26, 2017. Status post6cycles.  CURRENT THERAPY: Gemzar 1000 mg/m day 1 and day 8 of 21-day cycle. First dose 01/12/2018.  INTERVAL HISTORY: [name] returns with her son for follow up and cycle 3 day 1 gemzar. Cycle 2 day 1 was delayed due to thrombocytopenia and completed on 02/09/18; day 8 was omitted due to thrombocytopenia. In the interim the patient has been doing well, trying to eat well to improve her strength. She is active at home but does not leave the house much. Appetite is good most days. Denies n/v/c/d. She underwent thoracentesis on 02/23/18, she notes the "heaviness" in her chest resolved after procedure. Denies cough, chest pain, dyspnea, hemoptysis, or wheezing. Chronic back pain and neuropathy are stable.     REVIEW OF SYSTEMS:   Constitutional: Denies fevers, chills or abnormal weight loss Eyes: Denies blurriness of vision Ears, nose, mouth, throat, and face: Denies mucositis or sore throat Respiratory: Denies cough, hemoptysis, dyspnea or wheezes Cardiovascular: Denies palpitation, chest discomfort or lower extremity swelling Gastrointestinal:  Denies nausea, vomiting, constipation, diarrhea, heartburn or change in bowel habits Skin: Denies abnormal skin rashes Lymphatics: Denies new lymphadenopathy or easy bruising Neurological:Denies tingling or new weaknesses (+) neuropathy, stable  Behavioral/Psych: Mood is stable, no new changes  MSK: (+) chronic stable back pain   All  other systems were reviewed with the patient and are negative.  MEDICAL HISTORY:  Past Medical History:  Diagnosis Date  . Cancer ([place])    5 years ago - cervical cancer  . Encounter for antineoplastic chemotherapy 08/27/2015  . History of radiation therapy 08/19/2015 - 10/07/2015   Site/dose:   The patient's primary tumor and involved lymph nodes were treated to 66 Gy in 30 fractions.  . Hypokalemia 09/30/2015  . Non-small cell carcinoma of lung, stage 3 ([place]) 08/08/2015  . Paroxysmal atrial flutter ([place]) 09/16/2015  . Pneumonia     SURGICAL HISTORY: Past Surgical History:  Procedure Laterality Date  . IR FLUORO GUIDE PORT INSERTION RIGHT  05/21/2017  . IR US GUIDE VASC ACCESS RIGHT  05/21/2017  . TUBAL LIGATION    . VIDEO BRONCHOSCOPY WITH ENDOBRONCHIAL ULTRASOUND N/A 08/02/2015   Procedure: VIDEO BRONCHOSCOPY WITH ENDOBRONCHIAL ULTRASOUND;  Surgeon: [name], MD;  Location: [place];  Service: Thoracic;  Laterality: N/A;  . WISDOM TOOTH EXTRACTION      I have reviewed the social history and family history with the patient and they are unchanged from previous note.  ALLERGIES:  has No Known Allergies.  MEDICATIONS:  Current Outpatient Medications  Medication Sig Dispense Refill  .  gabapentin (NEURONTIN) 300 MG capsule Take 1 capsule in the morning, 1 capsule in the afternoon, and 2 capsules at bedtime. 120 capsule 0  . ibuprofen (ADVIL,MOTRIN) 800 MG tablet Take 1 tablet by mouth every 8 (eight) hours as needed.    . lidocaine (LIDODERM) 5 % Place 1 patch onto the skin daily. Remove & Discard patch within 12 hours or as directed by MD 30 patch 0  . lidocaine-prilocaine (EMLA) cream Apply 1 application topically as needed. 30 g 2  . LORazepam (ATIVAN) 0.5 MG tablet 0.5 mg sublingual p.o. every 6 hours as needed for nausea and/or vomiting. 40 tablet 0  . metoprolol tartrate (LOPRESSOR) 25 MG tablet Take 0.5 tablets (12.5 mg total) by mouth 2 (two) times daily. 90 tablet 1    . Multiple Vitamin (MULTIVITAMIN) capsule Take 1 capsule daily by mouth. 30 capsule 2  . ondansetron (ZOFRAN-ODT) 8 MG disintegrating tablet Take 1 tablet (8 mg total) by mouth every 8 (eight) hours as needed for nausea or vomiting. 20 tablet 1  . oxyCODONE (OXY IR/ROXICODONE) 5 MG immediate release tablet Take 1 tablet (5 mg total) by mouth every 6 (six) hours as needed for severe pain. 60 tablet 0  . potassium chloride SA (K-DUR,KLOR-CON) 20 MEQ tablet Take 2 tabs every morning and 1 tab every evening. 90 tablet 0  . prochlorperazine (COMPAZINE) 10 MG tablet Take 1 tablet (10 mg total) by mouth every 6 (six) hours as needed for nausea or vomiting. 30 tablet 1   Current Facility-Administered Medications  Medication Dose Route Frequency Provider Last Rate Last Dose  . 0.9 %  sodium chloride infusion   Intravenous Once Alla Feeling, NP       Facility-Administered Medications Ordered in Other Visits  Medication Dose Route Frequency Provider Last Rate Last Dose  . 0.9 %  sodium chloride infusion   Intravenous Continuous Curcio, Kristin R, NP      . 0.9 %  sodium chloride infusion   Intravenous Continuous Curt Bears, MD   Stopped at 05/31/17 1714  . ondansetron (ZOFRAN) injection 8 mg  8 mg Intravenous Once Curt Bears, MD      . sodium chloride flush (NS) 0.9 % injection 10 mL  10 mL Intracatheter PRN Curt Bears, MD   10 mL at 03/02/18 1332    PHYSICAL EXAMINATION: ECOG PERFORMANCE STATUS: 2 - Symptomatic, <50% confined to bed  Vitals:   03/02/18 0943  BP: 97/65  Pulse: (!) 115  Resp: 18  Temp: 98.2 F (36.8 C)  SpO2: 100%   Filed Weights   03/02/18 0943  Weight: 186 lb 1.6 oz (84.4 kg)    GENERAL:alert, no distress and comfortable SKIN: no rashes or significant lesions EYES: normal, Conjunctiva are pink and non-injected, sclera clear OROPHARYNX:no thrush or ulcers  LYMPH:  no palpable cervical or supraclavicular lymphadenopathy LUNGS: clear to auscultation,  diminished right mid lung and base; normal breathing effort HEART: tachycardic, regular rhythm, no lower extremity edema ABDOMEN:abdomen soft, non-tender and normal bowel sounds Musculoskeletal:no cyanosis of digits and no clubbing  NEURO: alert & oriented x 3 with fluent speech, no focal motor/sensory deficits. No peripheral sensory deficit to upper extremities per tuning fork exam  PAC without erythema   LABORATORY DATA:  I have reviewed the data as listed CBC Latest Ref Rng & Units 03/02/2018 02/17/2018 02/09/2018  WBC 3.9 - 10.3 K/uL 4.1 3.9 4.8  Hemoglobin 11.6 - 15.9 g/dL 8.8(L) 8.8(L) 9.5(L)  Hematocrit 34.8 - 46.6 % 26.5(L)  26.2(L) 28.4(L)  Platelets 145 - 400 K/uL 271 79(L) 137(L)     CMP Latest Ref Rng & Units 03/02/2018 02/17/2018 02/09/2018  Glucose 70 - 140 mg/dL 123 108 128  BUN 7 - 26 mg/dL 7 6(L) 7  Creatinine 0.60 - 1.10 mg/dL 0.74 0.72 0.75  Sodium 136 - 145 mmol/L 138 139 139  Potassium 3.5 - 5.1 mmol/L 3.5 3.3(L) 3.2(L)  Chloride 98 - 109 mmol/L 102 103 103  CO2 22 - 29 mmol/L _0 Calcium 8.4 - 10.4 mg/dL 9.7 9.5 9.6  Total Protein 6.4 - 8.3 g/dL 7.9 7.8 7.7  Total Bilirubin 0.2 - 1.2 mg/dL 0.5 0.5 0.5  Alkaline Phos 40 - 150 U/L 135 140 130  AST 5 - 34 U/L 54(H) 48(H) 49(H)  ALT 0 - 55 U/L _1 RADIOGRAPHIC STUDIES: I have personally reviewed the radiological images as listed and agreed with the findings in the report. No results found.   ASSESSMENT & PLAN:  Metastatic primary lung cancer, right ([place]) Theresa [name] is a pleasant 57 yo AAF with metastatic right lung cancer s/p extensive treatment history, currently on single agent gemcitabine 1000 mg/m2 on days 1 and 8 every 21 days. She completed cycle 1; cycle 2 was delayed and day 8 was discontinued due to recurrent thrombocytopenia. The patient has tolerated chemo moderately well. She undergoes therapeutic thoracenteses PRN, most recently on 02/23/18. Cytology was not done. Today her  platelet count recovered to normal, Plt 271K, however she remains anemic Hgb 8.8. Labs otherwise stable and adequate to proceed with cycle 3 day 1. In anticipation of worsening anemia, will arrange 2 units RBCs this week. Given her slightly decreased BP, tachycardia, and difficulty drinking water, I recommend she get 500 cc NS today with gemcitabine. Will obtain restaging scan after she completes cycle 3, ordered today. The plan was reviewed with Dr. Julien Nordmann.   PLAN: -Labs reviewed, proceed with cycle 3 day 1 gemzar  -Will arrange 2 units RBCs this week in anticipation of worsening anemia  -500 cc NS over 1 hour today  -Refilled lidoderm patch -Restaging CT CAP w contrast in 2-3 weeks (ordered today) before cycle 4 -Return in 1 week for cycle 2 day 8 -Lab, flush, f/u with Mikey Bussing, NP with cycle 4  Orders Placed This Encounter  Procedures  . CT Abdomen Pelvis W Contrast    Standing Status:   Future    Standing Expiration Date:   03/02/2019    Order Specific Question:   If indicated for the ordered procedure, I authorize the administration of contrast media per Radiology protocol    Answer:   Yes    Order Specific Question:   Is patient pregnant?    Answer:   No    Order Specific Question:   Preferred imaging location?    Answer:   [facility]    Order Specific Question:   Is Oral Contrast requested for this exam?    Answer:   Yes, Per Radiology protocol    Order Specific Question:   Radiology Contrast Protocol - do NOT remove file path    Answer:   \\[email or web address]  . CT Chest W Contrast    Standing Status:   Future    Standing Expiration Date:   03/02/2019    Order Specific Question:   If indicated for the ordered procedure, I authorize the administration of contrast media per Radiology protocol    Answer:  Yes    Order Specific Question:   Is patient pregnant?    Answer:   No    Order Specific Question:   Preferred imaging location?     Answer:   [facility]    Order Specific Question:   Radiology Contrast Protocol - do NOT remove file path    Answer:   \\[email or web address]  . Type and screen    Standing Status:   Future    Number of Occurrences:   1    Standing Expiration Date:   03/02/2019   All questions were answered. The patient knows to call the clinic with any problems, questions or concerns. No barriers to learning was detected. I spent 20 minutes counseling the patient face to face. The total time spent in the appointment was 25 minutes and more than 50% was on counseling and review of test results     Alla Feeling, NP 03/02/18

## 2018-03-02 ENCOUNTER — Other Ambulatory Visit: Payer: Medicare Other

## 2018-03-02 ENCOUNTER — Inpatient Hospital Stay: Payer: Medicare Other

## 2018-03-02 ENCOUNTER — Telehealth: Payer: Self-pay | Admitting: Nurse Practitioner

## 2018-03-02 ENCOUNTER — Inpatient Hospital Stay: Payer: Medicare Other | Attending: Internal Medicine

## 2018-03-02 ENCOUNTER — Encounter: Payer: Self-pay | Admitting: Nurse Practitioner

## 2018-03-02 ENCOUNTER — Inpatient Hospital Stay (HOSPITAL_BASED_OUTPATIENT_CLINIC_OR_DEPARTMENT_OTHER): Payer: Medicare Other | Admitting: Nurse Practitioner

## 2018-03-02 VITALS — HR 92

## 2018-03-02 VITALS — BP 97/65 | HR 115 | Temp 98.2°F | Resp 18 | Ht 68.0 in | Wt 186.1 lb

## 2018-03-02 DIAGNOSIS — E860 Dehydration: Secondary | ICD-10-CM | POA: Insufficient documentation

## 2018-03-02 DIAGNOSIS — J90 Pleural effusion, not elsewhere classified: Secondary | ICD-10-CM | POA: Insufficient documentation

## 2018-03-02 DIAGNOSIS — C3490 Malignant neoplasm of unspecified part of unspecified bronchus or lung: Secondary | ICD-10-CM

## 2018-03-02 DIAGNOSIS — C3481 Malignant neoplasm of overlapping sites of right bronchus and lung: Secondary | ICD-10-CM | POA: Diagnosis not present

## 2018-03-02 DIAGNOSIS — C3491 Malignant neoplasm of unspecified part of right bronchus or lung: Secondary | ICD-10-CM

## 2018-03-02 DIAGNOSIS — E876 Hypokalemia: Secondary | ICD-10-CM | POA: Diagnosis not present

## 2018-03-02 DIAGNOSIS — D630 Anemia in neoplastic disease: Secondary | ICD-10-CM

## 2018-03-02 DIAGNOSIS — D649 Anemia, unspecified: Secondary | ICD-10-CM | POA: Diagnosis not present

## 2018-03-02 DIAGNOSIS — R000 Tachycardia, unspecified: Secondary | ICD-10-CM

## 2018-03-02 DIAGNOSIS — Z5111 Encounter for antineoplastic chemotherapy: Secondary | ICD-10-CM | POA: Insufficient documentation

## 2018-03-02 DIAGNOSIS — Z5112 Encounter for antineoplastic immunotherapy: Secondary | ICD-10-CM

## 2018-03-02 DIAGNOSIS — R112 Nausea with vomiting, unspecified: Secondary | ICD-10-CM | POA: Diagnosis not present

## 2018-03-02 LAB — CBC WITH DIFFERENTIAL (CANCER CENTER ONLY)
BASOS ABS: 0 10*3/uL (ref 0.0–0.1)
Basophils Relative: 1 %
EOS ABS: 0 10*3/uL (ref 0.0–0.5)
EOS PCT: 1 %
HCT: 26.5 % — ABNORMAL LOW (ref 34.8–46.6)
Hemoglobin: 8.8 g/dL — ABNORMAL LOW (ref 11.6–15.9)
Lymphocytes Relative: 11 %
Lymphs Abs: 0.5 10*3/uL — ABNORMAL LOW (ref 0.9–3.3)
MCH: 32.8 pg (ref 25.1–34.0)
MCHC: 33.3 g/dL (ref 31.5–36.0)
MCV: 98.5 fL (ref 79.5–101.0)
MONO ABS: 0.7 10*3/uL (ref 0.1–0.9)
Monocytes Relative: 16 %
Neutro Abs: 2.9 10*3/uL (ref 1.5–6.5)
Neutrophils Relative %: 71 %
PLATELETS: 271 10*3/uL (ref 145–400)
RBC: 2.69 MIL/uL — AB (ref 3.70–5.45)
RDW: 21.9 % — ABNORMAL HIGH (ref 11.2–14.5)
WBC: 4.1 10*3/uL (ref 3.9–10.3)

## 2018-03-02 LAB — CMP (CANCER CENTER ONLY)
ALK PHOS: 135 U/L (ref 40–150)
ALT: 22 U/L (ref 0–55)
AST: 54 U/L — AB (ref 5–34)
Albumin: 3 g/dL — ABNORMAL LOW (ref 3.5–5.0)
Anion gap: 9 (ref 3–11)
BILIRUBIN TOTAL: 0.5 mg/dL (ref 0.2–1.2)
BUN: 7 mg/dL (ref 7–26)
CALCIUM: 9.7 mg/dL (ref 8.4–10.4)
CO2: 27 mmol/L (ref 22–29)
CREATININE: 0.74 mg/dL (ref 0.60–1.10)
Chloride: 102 mmol/L (ref 98–109)
GFR, Est AFR Am: >60 mL/min (ref >60)
GLUCOSE: 123 mg/dL (ref 70–140)
Potassium: 3.5 mmol/L (ref 3.5–5.1)
Sodium: 138 mmol/L (ref 136–145)
Total Protein: 7.9 g/dL (ref 6.4–8.3)

## 2018-03-02 MED ORDER — SODIUM CHLORIDE 0.9% FLUSH
10.0000 mL | INTRAVENOUS | Status: DC | PRN
  Administered 2018-03-02: 10 mL
  Filled 2018-03-02: qty 10

## 2018-03-02 MED ORDER — SODIUM CHLORIDE 0.9 % IV SOLN: Freq: Once | INTRAVENOUS | Status: DC

## 2018-03-02 MED ORDER — PROCHLORPERAZINE MALEATE 10 MG PO TABS
10.0000 mg | ORAL_TABLET | Freq: Once | ORAL | Status: AC
  Administered 2018-03-02: 10 mg via ORAL

## 2018-03-02 MED ORDER — PROCHLORPERAZINE MALEATE 10 MG PO TABS
ORAL_TABLET | ORAL | Status: AC
Start: 2018-03-02 — End: ?
  Filled 2018-03-02: qty 1

## 2018-03-02 MED ORDER — SODIUM CHLORIDE 0.9 % IV SOLN
Freq: Once | INTRAVENOUS | Status: AC
  Administered 2018-03-02: 11:00:00 via INTRAVENOUS

## 2018-03-02 MED ORDER — HEPARIN SOD (PORK) LOCK FLUSH 100 UNIT/ML IV SOLN
500.0000 [IU] | Freq: Once | INTRAVENOUS | Status: AC | PRN
  Administered 2018-03-02: 500 [IU]
  Filled 2018-03-02: qty 5

## 2018-03-02 MED ORDER — LIDOCAINE 5 % EX PTCH: 1.0000 | MEDICATED_PATCH | CUTANEOUS | 0 refills | Status: DC

## 2018-03-02 MED ORDER — SODIUM CHLORIDE 0.9 % IV SOLN
2000.0000 mg | Freq: Once | INTRAVENOUS | Status: AC
  Administered 2018-03-02: 2000 mg via INTRAVENOUS
  Filled 2018-03-02: qty 52.6

## 2018-03-02 NOTE — Telephone Encounter (Signed)
Left message for patient regarding appointment that was added to her schedule for Transfusion on 03/05/18 .  All other appointments scheduled .  Patient will pick up new schedule at next visit per 6/12 los

## 2018-03-02 NOTE — Patient Instructions (Signed)
Implanted Port Home Guide An implanted port is a type of central line that is placed under the skin. Central lines are used to provide IV access when treatment or nutrition needs to be given through a person's veins. Implanted ports are used for long-term IV access. An implanted port may be placed because:  You need IV medicine that would be irritating to the small veins in your hands or arms.  You need long-term IV medicines, such as antibiotics.  You need IV nutrition for a long period.  You need frequent blood draws for lab tests.  You need dialysis.  Implanted ports are usually placed in the chest area, but they can also be placed in the upper arm, the abdomen, or the leg. An implanted port has two main parts:  Reservoir. The reservoir is round and will appear as a small, raised area under your skin. The reservoir is the part where a needle is inserted to give medicines or draw blood.  Catheter. The catheter is a thin, flexible tube that extends from the reservoir. The catheter is placed into a large vein. Medicine that is inserted into the reservoir goes into the catheter and then into the vein.  How will I care for my incision site? Do not get the incision site wet. Bathe or shower as directed by your health care provider. How is my port accessed? Special steps must be taken to access the port:  Before the port is accessed, a numbing cream can be placed on the skin. This helps numb the skin over the port site.  Your health care provider uses a sterile technique to access the port. ? Your health care provider must put on a mask and sterile gloves. ? The skin over your port is cleaned carefully with an antiseptic and allowed to dry. ? The port is gently pinched between sterile gloves, and a needle is inserted into the port.  Only "non-coring" port needles should be used to access the port. Once the port is accessed, a blood return should be checked. This helps ensure that the port  is in the vein and is not clogged.  If your port needs to remain accessed for a constant infusion, a clear (transparent) bandage will be placed over the needle site. The bandage and needle will need to be changed every week, or as directed by your health care provider.  Keep the bandage covering the needle clean and dry. Do not get it wet. Follow your health care provider's instructions on how to take a shower or bath while the port is accessed.  If your port does not need to stay accessed, no bandage is needed over the port.  What is flushing? Flushing helps keep the port from getting clogged. Follow your health care provider's instructions on how and when to flush the port. Ports are usually flushed with saline solution or a medicine called heparin. The need for flushing will depend on how the port is used.  If the port is used for intermittent medicines or blood draws, the port will need to be flushed: ? After medicines have been given. ? After blood has been drawn. ? As part of routine maintenance.  If a constant infusion is running, the port may not need to be flushed.  How long will my port stay implanted? The port can stay in for as long as your health care provider thinks it is needed. When it is time for the port to come out, surgery will be   done to remove it. The procedure is similar to the one performed when the port was put in. When should I seek immediate medical care? When you have an implanted port, you should seek immediate medical care if:  You notice a bad smell coming from the incision site.  You have swelling, redness, or drainage at the incision site.  You have more swelling or pain at the port site or the surrounding area.  You have a fever that is not controlled with medicine.  This information is not intended to replace advice given to you by your health care provider. Make sure you discuss any questions you have with your health care provider. Document  Released: 09/07/2005 Document Revised: 02/13/2016 Document Reviewed: 05/15/2013 Elsevier Interactive Patient Education  2017 [employer]  

## 2018-03-02 NOTE — Patient Instructions (Signed)
[facility] Discharge Instructions for Patients Receiving Chemotherapy  Today you received the following chemotherapy agents gemcitabine (Gemzar)  To help prevent nausea and vomiting after your treatment, we encourage you to take your nausea medication as directed   If you develop nausea and vomiting that is not controlled by your nausea medication, call the clinic.   BELOW ARE SYMPTOMS THAT SHOULD BE REPORTED IMMEDIATELY:  *FEVER GREATER THAN 100.5 F  *CHILLS WITH OR WITHOUT FEVER  NAUSEA AND VOMITING THAT IS NOT CONTROLLED WITH YOUR NAUSEA MEDICATION  *UNUSUAL SHORTNESS OF BREATH  *UNUSUAL BRUISING OR BLEEDING  TENDERNESS IN MOUTH AND THROAT WITH OR WITHOUT PRESENCE OF ULCERS  *URINARY PROBLEMS  *BOWEL PROBLEMS  UNUSUAL RASH Items with * indicate a potential emergency and should be followed up as soon as possible.  Feel free to call the clinic should you have any questions or concerns. The clinic phone number is [phone].  Please show the [place] at check-in to the Emergency Department and triage nurse.

## 2018-03-03 ENCOUNTER — Other Ambulatory Visit: Payer: Self-pay | Admitting: Nurse Practitioner

## 2018-03-03 DIAGNOSIS — T451X5A Adverse effect of antineoplastic and immunosuppressive drugs, initial encounter: Principal | ICD-10-CM

## 2018-03-03 DIAGNOSIS — D6481 Anemia due to antineoplastic chemotherapy: Secondary | ICD-10-CM

## 2018-03-05 ENCOUNTER — Inpatient Hospital Stay: Payer: Medicare Other

## 2018-03-05 VITALS — BP 100/73 | HR 104 | Temp 98.5°F | Resp 17

## 2018-03-05 DIAGNOSIS — D6481 Anemia due to antineoplastic chemotherapy: Secondary | ICD-10-CM

## 2018-03-05 DIAGNOSIS — E860 Dehydration: Secondary | ICD-10-CM | POA: Diagnosis not present

## 2018-03-05 DIAGNOSIS — R112 Nausea with vomiting, unspecified: Secondary | ICD-10-CM | POA: Diagnosis not present

## 2018-03-05 DIAGNOSIS — C3481 Malignant neoplasm of overlapping sites of right bronchus and lung: Secondary | ICD-10-CM | POA: Diagnosis not present

## 2018-03-05 DIAGNOSIS — D649 Anemia, unspecified: Secondary | ICD-10-CM | POA: Diagnosis not present

## 2018-03-05 DIAGNOSIS — T451X5A Adverse effect of antineoplastic and immunosuppressive drugs, initial encounter: Principal | ICD-10-CM

## 2018-03-05 DIAGNOSIS — Z5111 Encounter for antineoplastic chemotherapy: Secondary | ICD-10-CM | POA: Diagnosis not present

## 2018-03-05 DIAGNOSIS — R000 Tachycardia, unspecified: Secondary | ICD-10-CM | POA: Diagnosis not present

## 2018-03-05 LAB — PREPARE RBC (CROSSMATCH)

## 2018-03-05 MED ORDER — SODIUM CHLORIDE 0.9 % IV SOLN
250.0000 mL | Freq: Once | INTRAVENOUS | Status: AC
  Administered 2018-03-05: 250 mL via INTRAVENOUS

## 2018-03-05 MED ORDER — ONDANSETRON HCL 40 MG/20ML IJ SOLN
Freq: Once | INTRAMUSCULAR | Status: AC
  Administered 2018-03-05: 08:00:00 via INTRAVENOUS

## 2018-03-05 MED ORDER — ONDANSETRON HCL 4 MG/2ML IJ SOLN
INTRAMUSCULAR | Status: AC
  Filled 2018-03-05: qty 4

## 2018-03-05 MED ORDER — SODIUM CHLORIDE 0.9% FLUSH
10.0000 mL | INTRAVENOUS | Status: DC | PRN
  Administered 2018-03-05: 10 mL
  Filled 2018-03-05: qty 10

## 2018-03-05 MED ORDER — HEPARIN SOD (PORK) LOCK FLUSH 100 UNIT/ML IV SOLN
500.0000 [IU] | Freq: Every day | INTRAVENOUS | Status: AC | PRN
  Administered 2018-03-05: 500 [IU]
  Filled 2018-03-05: qty 5

## 2018-03-05 MED ORDER — HEPARIN SOD (PORK) LOCK FLUSH 100 UNIT/ML IV SOLN
500.0000 [IU] | Freq: Once | INTRAVENOUS | Status: DC | PRN
  Filled 2018-03-05: qty 5

## 2018-03-05 MED ORDER — HEPARIN SOD (PORK) LOCK FLUSH 100 UNIT/ML IV SOLN
250.0000 [IU] | INTRAVENOUS | Status: DC | PRN
  Filled 2018-03-05: qty 5

## 2018-03-05 NOTE — Patient Instructions (Signed)

## 2018-03-07 LAB — TYPE AND SCREEN
ABO/RH(D): O POS
ANTIBODY SCREEN: NEGATIVE
Unit division: 0
Unit division: 0

## 2018-03-07 LAB — BPAM RBC
Blood Product Expiration Date: 201907062359
Blood Product Expiration Date: 201907062359
ISSUE DATE / TIME: 201906150814
ISSUE DATE / TIME: 201906150814
UNIT TYPE AND RH: 5100
Unit Type and Rh: 5100

## 2018-03-08 ENCOUNTER — Other Ambulatory Visit: Payer: Self-pay | Admitting: Medical Oncology

## 2018-03-08 DIAGNOSIS — G609 Hereditary and idiopathic neuropathy, unspecified: Secondary | ICD-10-CM

## 2018-03-08 DIAGNOSIS — C3491 Malignant neoplasm of unspecified part of right bronchus or lung: Secondary | ICD-10-CM

## 2018-03-08 DIAGNOSIS — R112 Nausea with vomiting, unspecified: Secondary | ICD-10-CM

## 2018-03-08 MED ORDER — ONDANSETRON 8 MG PO TBDP: 8.0000 mg | ORAL_TABLET | Freq: Three times a day (TID) | ORAL | 1 refills | Status: DC | PRN

## 2018-03-08 MED ORDER — OXYCODONE HCL 5 MG PO TABS: 5.0000 mg | ORAL_TABLET | Freq: Four times a day (QID) | ORAL | 0 refills | Status: DC | PRN

## 2018-03-08 MED ORDER — GABAPENTIN 300 MG PO CAPS: ORAL_CAPSULE | ORAL | 0 refills | Status: DC

## 2018-03-08 NOTE — Progress Notes (Signed)
Pt notified to pick up rx.

## 2018-03-09 ENCOUNTER — Inpatient Hospital Stay: Payer: Medicare Other

## 2018-03-09 VITALS — BP 122/89 | HR 116 | Temp 99.2°F | Resp 18

## 2018-03-09 DIAGNOSIS — C3491 Malignant neoplasm of unspecified part of right bronchus or lung: Secondary | ICD-10-CM

## 2018-03-09 DIAGNOSIS — D649 Anemia, unspecified: Secondary | ICD-10-CM | POA: Diagnosis not present

## 2018-03-09 DIAGNOSIS — R112 Nausea with vomiting, unspecified: Secondary | ICD-10-CM | POA: Diagnosis not present

## 2018-03-09 DIAGNOSIS — Z5111 Encounter for antineoplastic chemotherapy: Secondary | ICD-10-CM | POA: Diagnosis not present

## 2018-03-09 DIAGNOSIS — R000 Tachycardia, unspecified: Secondary | ICD-10-CM | POA: Diagnosis not present

## 2018-03-09 DIAGNOSIS — C3481 Malignant neoplasm of overlapping sites of right bronchus and lung: Secondary | ICD-10-CM | POA: Diagnosis not present

## 2018-03-09 DIAGNOSIS — E860 Dehydration: Secondary | ICD-10-CM

## 2018-03-09 LAB — CMP (CANCER CENTER ONLY)
ALT: 44 U/L (ref 0–55)
AST: 58 U/L — ABNORMAL HIGH (ref 5–34)
Albumin: 2.9 g/dL — ABNORMAL LOW (ref 3.5–5.0)
Alkaline Phosphatase: 158 U/L — ABNORMAL HIGH (ref 40–150)
Anion gap: 8 (ref 3–11)
BUN: 6 mg/dL — ABNORMAL LOW (ref 7–26)
CO2: 26 mmol/L (ref 22–29)
Calcium: 9.8 mg/dL (ref 8.4–10.4)
Chloride: 102 mmol/L (ref 98–109)
Creatinine: 0.72 mg/dL (ref 0.60–1.10)
GFR, Est AFR Am: >60 mL/min
GFR, Estimated: >60 mL/min
Glucose, Bld: 107 mg/dL (ref 70–140)
Potassium: 3.5 mmol/L (ref 3.5–5.1)
Sodium: 136 mmol/L (ref 136–145)
Total Bilirubin: 0.6 mg/dL (ref 0.2–1.2)
Total Protein: 8 g/dL (ref 6.4–8.3)

## 2018-03-09 LAB — CBC WITH DIFFERENTIAL (CANCER CENTER ONLY)
Basophils Absolute: 0 K/uL (ref 0.0–0.1)
Basophils Relative: 1 %
Eosinophils Absolute: 0 K/uL (ref 0.0–0.5)
Eosinophils Relative: 0 %
HCT: 31.9 % — ABNORMAL LOW (ref 34.8–46.6)
Hemoglobin: 10.6 g/dL — ABNORMAL LOW (ref 11.6–15.9)
Lymphocytes Relative: 12 %
Lymphs Abs: 0.4 K/uL — ABNORMAL LOW (ref 0.9–3.3)
MCH: 31.4 pg (ref 25.1–34.0)
MCHC: 33.3 g/dL (ref 31.5–36.0)
MCV: 94.3 fL (ref 79.5–101.0)
Monocytes Absolute: 0.2 K/uL (ref 0.1–0.9)
Monocytes Relative: 7 %
Neutro Abs: 2.4 K/uL (ref 1.5–6.5)
Neutrophils Relative %: 80 %
Platelet Count: 127 K/uL — ABNORMAL LOW (ref 145–400)
RBC: 3.38 MIL/uL — ABNORMAL LOW (ref 3.70–5.45)
RDW: 20 % — ABNORMAL HIGH (ref 11.2–14.5)
WBC Count: 2.9 K/uL — ABNORMAL LOW (ref 3.9–10.3)

## 2018-03-09 MED ORDER — HEPARIN SOD (PORK) LOCK FLUSH 100 UNIT/ML IV SOLN
500.0000 [IU] | Freq: Once | INTRAVENOUS | Status: AC | PRN
  Administered 2018-03-09: 500 [IU]
  Filled 2018-03-09: qty 5

## 2018-03-09 MED ORDER — SODIUM CHLORIDE 0.9% FLUSH
10.0000 mL | INTRAVENOUS | Status: DC | PRN
  Administered 2018-03-09: 10 mL
  Filled 2018-03-09: qty 10

## 2018-03-09 MED ORDER — SODIUM CHLORIDE 0.9 % IV SOLN
Freq: Once | INTRAVENOUS | Status: AC
  Administered 2018-03-09: 09:00:00 via INTRAVENOUS

## 2018-03-09 MED ORDER — SODIUM CHLORIDE 0.9 % IV SOLN
2000.0000 mg | Freq: Once | INTRAVENOUS | Status: AC
  Administered 2018-03-09: 2000 mg via INTRAVENOUS
  Filled 2018-03-09: qty 52.6

## 2018-03-09 MED ORDER — PROCHLORPERAZINE MALEATE 10 MG PO TABS
10.0000 mg | ORAL_TABLET | Freq: Once | ORAL | Status: AC
  Administered 2018-03-09: 10 mg via ORAL

## 2018-03-09 MED ORDER — PROCHLORPERAZINE MALEATE 10 MG PO TABS
ORAL_TABLET | ORAL | Status: AC
  Filled 2018-03-09: qty 1

## 2018-03-09 NOTE — Patient Instructions (Signed)
[facility] Discharge Instructions for Patients Receiving Chemotherapy  Today you received the following chemotherapy agents Gemzar To help prevent nausea and vomiting after your treatment, we encourage you to take your nausea medication as prescribed.  If you develop nausea and vomiting that is not controlled by your nausea medication, call the clinic.   BELOW ARE SYMPTOMS THAT SHOULD BE REPORTED IMMEDIATELY:  *FEVER GREATER THAN 100.5 F  *CHILLS WITH OR WITHOUT FEVER  NAUSEA AND VOMITING THAT IS NOT CONTROLLED WITH YOUR NAUSEA MEDICATION  *UNUSUAL SHORTNESS OF BREATH  *UNUSUAL BRUISING OR BLEEDING  TENDERNESS IN MOUTH AND THROAT WITH OR WITHOUT PRESENCE OF ULCERS  *URINARY PROBLEMS  *BOWEL PROBLEMS  UNUSUAL RASH Items with * indicate a potential emergency and should be followed up as soon as possible.  Feel free to call the clinic should you have any questions or concerns. The clinic phone number is [phone].  Please show the CHEMO ALERT CARD at check-in to the Emergency Department and triage nurse.   

## 2018-03-09 NOTE — Progress Notes (Signed)
Per Dr. Julien Nordmann, it's OK to treat with today's labs.

## 2018-03-11 ENCOUNTER — Telehealth: Payer: Self-pay | Admitting: Medical Oncology

## 2018-03-11 ENCOUNTER — Other Ambulatory Visit: Payer: Self-pay | Admitting: Medical Oncology

## 2018-03-11 DIAGNOSIS — R112 Nausea with vomiting, unspecified: Secondary | ICD-10-CM

## 2018-03-11 DIAGNOSIS — R110 Nausea: Secondary | ICD-10-CM

## 2018-03-11 MED ORDER — ONDANSETRON HCL 8 MG PO TABS: 8.0000 mg | ORAL_TABLET | Freq: Three times a day (TID) | ORAL | 0 refills | Status: DC | PRN

## 2018-03-11 MED ORDER — PROCHLORPERAZINE MALEATE 10 MG PO TABS: 10.0000 mg | ORAL_TABLET | Freq: Four times a day (QID) | ORAL | 1 refills | Status: DC | PRN

## 2018-03-11 NOTE — Telephone Encounter (Signed)
Called in zofran po  q 8 hours prn nausea.

## 2018-03-15 NOTE — Progress Notes (Signed)
PA for Odansetron 8 mg has been submitted.

## 2018-03-16 ENCOUNTER — Other Ambulatory Visit: Payer: Medicare Other

## 2018-03-16 ENCOUNTER — Ambulatory Visit: Payer: Medicare Other | Admitting: Internal Medicine

## 2018-03-16 ENCOUNTER — Ambulatory Visit: Payer: Medicare Other

## 2018-03-16 ENCOUNTER — Telehealth: Payer: Self-pay | Admitting: Medical Oncology

## 2018-03-16 DIAGNOSIS — C3491 Malignant neoplasm of unspecified part of right bronchus or lung: Secondary | ICD-10-CM

## 2018-03-16 NOTE — Telephone Encounter (Signed)
Asking to come to [facility] tomorrow. Feels like she needs fluids. She has persistent nausea, taking compazine. She is  able to eat ,but feels really bad. Orders placed and schedule message sent.

## 2018-03-16 NOTE — Progress Notes (Signed)
PA for Odansetron has been approved.

## 2018-03-17 ENCOUNTER — Ambulatory Visit (HOSPITAL_COMMUNITY)
Admission: RE | Admit: 2018-03-17 | Discharge: 2018-03-17 | Disposition: A | Discharge status: Home / Self Care | Payer: Medicare Other | Source: Ambulatory Visit | Attending: Medical | Admitting: Medical

## 2018-03-17 ENCOUNTER — Inpatient Hospital Stay: Payer: Medicare Other

## 2018-03-17 ENCOUNTER — Telehealth: Payer: Self-pay | Admitting: Internal Medicine

## 2018-03-17 ENCOUNTER — Other Ambulatory Visit: Payer: Self-pay

## 2018-03-17 ENCOUNTER — Inpatient Hospital Stay (HOSPITAL_BASED_OUTPATIENT_CLINIC_OR_DEPARTMENT_OTHER): Payer: Medicare Other | Admitting: Medical

## 2018-03-17 VITALS — BP 106/92 | HR 135 | Temp 98.4°F | Resp 17 | Ht 68.0 in

## 2018-03-17 VITALS — BP 112/83 | HR 101 | Temp 97.9°F | Resp 18

## 2018-03-17 DIAGNOSIS — E876 Hypokalemia: Secondary | ICD-10-CM

## 2018-03-17 DIAGNOSIS — R000 Tachycardia, unspecified: Secondary | ICD-10-CM | POA: Diagnosis not present

## 2018-03-17 DIAGNOSIS — R112 Nausea with vomiting, unspecified: Secondary | ICD-10-CM

## 2018-03-17 DIAGNOSIS — Z5111 Encounter for antineoplastic chemotherapy: Secondary | ICD-10-CM | POA: Diagnosis not present

## 2018-03-17 DIAGNOSIS — E860 Dehydration: Secondary | ICD-10-CM

## 2018-03-17 DIAGNOSIS — D649 Anemia, unspecified: Secondary | ICD-10-CM | POA: Diagnosis not present

## 2018-03-17 DIAGNOSIS — C3481 Malignant neoplasm of overlapping sites of right bronchus and lung: Secondary | ICD-10-CM

## 2018-03-17 DIAGNOSIS — R110 Nausea: Secondary | ICD-10-CM

## 2018-03-17 DIAGNOSIS — C3491 Malignant neoplasm of unspecified part of right bronchus or lung: Secondary | ICD-10-CM

## 2018-03-17 DIAGNOSIS — C3490 Malignant neoplasm of unspecified part of unspecified bronchus or lung: Secondary | ICD-10-CM | POA: Diagnosis not present

## 2018-03-17 DIAGNOSIS — R5381 Other malaise: Secondary | ICD-10-CM

## 2018-03-17 DIAGNOSIS — R1114 Bilious vomiting: Secondary | ICD-10-CM

## 2018-03-17 DIAGNOSIS — J90 Pleural effusion, not elsewhere classified: Secondary | ICD-10-CM | POA: Insufficient documentation

## 2018-03-17 DIAGNOSIS — C342 Malignant neoplasm of middle lobe, bronchus or lung: Secondary | ICD-10-CM

## 2018-03-17 LAB — CMP (CANCER CENTER ONLY)
ALT: 49 U/L — AB (ref 0–44)
AST: 49 U/L — ABNORMAL HIGH (ref 15–41)
Albumin: 3 g/dL — ABNORMAL LOW (ref 3.5–5.0)
Alkaline Phosphatase: 164 U/L — ABNORMAL HIGH (ref 38–126)
Anion gap: 8 (ref 5–15)
BUN: 8 mg/dL (ref 6–20)
CO2: 28 mmol/L (ref 22–32)
CREATININE: 0.67 mg/dL (ref 0.44–1.00)
Calcium: 9.9 mg/dL (ref 8.9–10.3)
Chloride: 101 mmol/L (ref 98–111)
Glucose, Bld: 115 mg/dL — ABNORMAL HIGH (ref 70–99)
POTASSIUM: 3.2 mmol/L — AB (ref 3.5–5.1)
SODIUM: 137 mmol/L (ref 135–145)
Total Bilirubin: 0.7 mg/dL (ref 0.3–1.2)
Total Protein: 8.2 g/dL — ABNORMAL HIGH (ref 6.5–8.1)

## 2018-03-17 LAB — CBC WITH DIFFERENTIAL (CANCER CENTER ONLY)
BASOS PCT: 0 %
Basophils Absolute: 0 10*3/uL (ref 0.0–0.1)
EOS ABS: 0 10*3/uL (ref 0.0–0.5)
Eosinophils Relative: 0 %
HEMATOCRIT: 30.3 % — AB (ref 34.8–46.6)
HEMOGLOBIN: 10.1 g/dL — AB (ref 11.6–15.9)
LYMPHS ABS: 0.4 10*3/uL — AB (ref 0.9–3.3)
Lymphocytes Relative: 17 %
MCH: 31.4 pg (ref 25.1–34.0)
MCHC: 33.2 g/dL (ref 31.5–36.0)
MCV: 94.3 fL (ref 79.5–101.0)
Monocytes Absolute: 0.3 10*3/uL (ref 0.1–0.9)
Monocytes Relative: 12 %
NEUTROS ABS: 1.5 10*3/uL (ref 1.5–6.5)
NEUTROS PCT: 71 %
Platelet Count: 43 10*3/uL — ABNORMAL LOW (ref 145–400)
RBC: 3.21 MIL/uL — AB (ref 3.70–5.45)
RDW: 19.9 % — ABNORMAL HIGH (ref 11.2–14.5)
WBC: 2.2 10*3/uL — AB (ref 3.9–10.3)

## 2018-03-17 LAB — SAMPLE TO BLOOD BANK

## 2018-03-17 MED ORDER — ONDANSETRON HCL 4 MG/2ML IJ SOLN
INTRAMUSCULAR | Status: AC
  Filled 2018-03-17: qty 4

## 2018-03-17 MED ORDER — HEPARIN SOD (PORK) LOCK FLUSH 100 UNIT/ML IV SOLN
500.0000 [IU] | Freq: Once | INTRAVENOUS | Status: AC | PRN
  Administered 2018-03-17: 500 [IU]
  Filled 2018-03-17: qty 5

## 2018-03-17 MED ORDER — DEXAMETHASONE SODIUM PHOSPHATE 10 MG/ML IJ SOLN
10.0000 mg | Freq: Once | INTRAMUSCULAR | Status: AC
  Administered 2018-03-17: 10 mg via INTRAVENOUS

## 2018-03-17 MED ORDER — ONDANSETRON HCL 4 MG/2ML IJ SOLN
8.0000 mg | Freq: Once | INTRAMUSCULAR | Status: AC
  Administered 2018-03-17: 8 mg via INTRAVENOUS

## 2018-03-17 MED ORDER — SODIUM CHLORIDE 0.9 % IV SOLN: 10.0000 mg | Freq: Once | INTRAVENOUS | Status: DC

## 2018-03-17 MED ORDER — SODIUM CHLORIDE 0.9 % IV SOLN: Freq: Once | INTRAVENOUS | Status: DC

## 2018-03-17 MED ORDER — SODIUM CHLORIDE 0.9 % IV SOLN
Freq: Once | INTRAVENOUS | Status: AC
  Administered 2018-03-17: 13:00:00 via INTRAVENOUS

## 2018-03-17 MED ORDER — LORAZEPAM 0.5 MG PO TABS: ORAL_TABLET | ORAL | 0 refills | Status: DC

## 2018-03-17 MED ORDER — DEXAMETHASONE SODIUM PHOSPHATE 10 MG/ML IJ SOLN
INTRAMUSCULAR | Status: AC
  Filled 2018-03-17: qty 1

## 2018-03-17 MED ORDER — ONDANSETRON HCL 8 MG PO TABS: 8.0000 mg | ORAL_TABLET | Freq: Three times a day (TID) | ORAL | 3 refills | Status: DC | PRN

## 2018-03-17 MED ORDER — SODIUM CHLORIDE 0.9% FLUSH
10.0000 mL | INTRAVENOUS | Status: DC | PRN
  Administered 2018-03-17: 10 mL
  Filled 2018-03-17: qty 10

## 2018-03-17 MED FILL — LORazepam 0.5 MG TABS: 0.5 | 10 days supply | Qty: 40 | Fill #0

## 2018-03-17 MED FILL — ONDANSETRON HCL 8 MG TABLET: 8 | 7 days supply | Qty: 20 | Fill #0

## 2018-03-17 NOTE — Progress Notes (Addendum)
[facility] Progress Note   Theresa [name] [id number] Sep 30, 1960 57 y.o.  Theresa [name] is managed by Theresa [name]. Theresa [name]  Actively treated with chemotherapy/immunotherapy: yes  Current Therapy:   Gemcitabine  Last Treated:  03/09/2018 (cycle 3, day 8)  Assessment: Plan:    Dehydration - Plan: DISCONTINUED: sodium chloride flush (NS) 0.9 % injection 10 mL, DISCONTINUED: heparin lock flush 100 unit/mL, DISCONTINUED: 0.9 %  sodium chloride infusion, DISCONTINUED: ondansetron (ZOFRAN) 8 mg in sodium chloride 0.9 % 50 mL IVPB  Nausea and vomiting, intractability of vomiting not specified, unspecified vomiting type - Plan: ondansetron (ZOFRAN) 8 MG tablet, LORazepam (ATIVAN) 0.5 MG tablet  Pleural effusion - Plan: DG Chest Right Decubitus, US THORACENTESIS ASP PLEURAL SPACE W/IMG GUIDE  Metastatic primary lung cancer, right ([place]) - Plan: DG Chest Right Decubitus, US THORACENTESIS ASP PLEURAL SPACE W/IMG GUIDE  Hypokalemia  Physical deconditioning - Plan: PT self-care home management   Metastatic non-small cell lung cancer with a large right pleural effusion: A right lateral decub returned showing a large loculated right pleural effusion.  The patient was referred to interventional radiology for consideration of a repeat right thoracentesis.  The patient is status post cycle 3, day 8 of gemcitabine last dosed on 03/09/2018.  She is pending restaging CT scans scheduled for 03/21/2018.  She will return for follow-up of the studies on 03/23/2018.  Nausea and vomiting: The patient was given Zofran 8 mg IV along with Decadron 10 mg IV today.  She was given a prescription for Zofran 8 mg p.o. 3 times daily as needed nausea and Ativan 0.5 mg sublingual every 6 hours as needed for nausea.  Dehydration: The patient was given 1 L of normal saline IV today.  Hypokalemia: The patient's labs returned with a potassium of 3.2.  She has not taken her potassium for the last  several days due to her nausea and vomiting.  She was instructed to restart her potassium.  Physical deconditioning: A referral has been made for home physical therapy.  Please see After Visit Summary for patient specific instructions.  Future Appointments  Date Time Provider [place]  03/21/2018  3:30 PM [facility] 2 [facility] Max  03/23/2018  1:30 PM [facility] LAB 3 [facility] None  03/23/2018  1:45 PM [facility] FLUSH NURSE [facility] None  03/23/2018  2:00 PM Curcio, Kristin R, NP [facility] None  03/23/2018  3:00 PM [facility] A2 [facility] None  03/30/2018  8:15 AM [facility] LAB ONLY [facility] None  03/30/2018  8:30 AM [facility] [place] FLUSH [facility] None  03/30/2018  9:30 AM [facility] INFUSION [facility] None  04/12/2018  1:00 PM [facility] LAB 5 [facility] None  04/12/2018  1:15 PM [facility] [place] FLUSH [facility] None  04/12/2018  2:00 PM Curcio, Kristin R, NP [facility] None  04/12/2018  3:00 PM [facility] INFUSION [facility] None  04/20/2018  1:45 PM [facility] LAB 2 [facility] None  04/20/2018  2:00 PM [facility] [place] FLUSH [facility] None  04/20/2018  2:30 PM [facility] INFUSION [facility] None    Orders Placed This Encounter  Procedures  . DG Chest Right Decubitus  . US THORACENTESIS ASP PLEURAL SPACE W/IMG GUIDE  . PT self-care home management       Subjective:   Patient ID:  Theresa [name] is a 57 y.o. (DOB 10-12-1960) female.  Chief Complaint:  Chief Complaint  Patient presents with  . Fatigue    HPI Theresa [name] is a 57 year old female with a history of a metastatic  non-small cell lung cancer originally diagnosed in November 2016.  She is managed by Theresa [name]. Theresa [name] and is status post cycle 3-day 8 of gemcitabine which was last dosed on 03/09/2018.  She presents to the office today with nausea and vomiting over the last 3 days.  She has fatigue and believes that she is dehydrated.  She is having positional dizziness with rising from a seated  position.  She is status post a right thoracentesis.  She believes that she could have a recurrence of a right pleural effusion.  She request consideration of a right thoracentesis.  She has a history of hypokalemia.  She has not taken potassium for the past several days due to nausea and vomiting.  Medications: I have reviewed the patient's current medications.  Allergies: No Known Allergies  Past Medical History:  Diagnosis Date  . Cancer ([place])    5 years ago - cervical cancer  . Encounter for antineoplastic chemotherapy 08/27/2015  . History of radiation therapy 08/19/2015 - 10/07/2015   Site/dose:   The patient's primary tumor and involved lymph nodes were treated to 66 Gy in 30 fractions.  . Hypokalemia 09/30/2015  . Non-small cell carcinoma of lung, stage 3 ([place]) 08/08/2015  . Paroxysmal atrial flutter ([place]) 09/16/2015  . Pneumonia     Past Surgical History:  Procedure Laterality Date  . IR FLUORO GUIDE PORT INSERTION RIGHT  05/21/2017  . IR US GUIDE VASC ACCESS RIGHT  05/21/2017  . TUBAL LIGATION    . VIDEO BRONCHOSCOPY WITH ENDOBRONCHIAL ULTRASOUND N/A 08/02/2015   Procedure: VIDEO BRONCHOSCOPY WITH ENDOBRONCHIAL ULTRASOUND;  Surgeon: [name], MD;  Location: [facility] OR;  Service: Thoracic;  Laterality: N/A;  . WISDOM TOOTH EXTRACTION      Family History  Problem Relation Age of Onset  . COPD Mother   . Diabetes type II Mother   . Heart disease Mother   . High Cholesterol Mother   . Lung cancer Father     Social History   Socioeconomic History  . Marital status: Single    Spouse name: Not on file  . Number of children: Not on file  . Years of education: Not on file  . Highest education level: Not on file  Occupational History  . Not on file  Social Needs  . Financial resource strain: Not on file  . Food insecurity:    Worry: Not on file    Inability: Not on file  . Transportation needs:    Medical: Not on file    Non-medical: Not on file  Tobacco Use    . Smoking status: Former Smoker    Packs/day: 0.30    Years: 5.00    Pack years: 1.50    Types: Cigarettes    Last attempt to quit: 07/31/1990    Years since quitting: 27.6  . Smokeless tobacco: Never Used  Substance and Sexual Activity  . Alcohol use: No    Alcohol/week: 0.0 oz  . Drug use: No  . Sexual activity: Not Currently  Lifestyle  . Physical activity:    Days per week: Not on file    Minutes per session: Not on file  . Stress: Not on file  Relationships  . Social connections:    Talks on phone: Not on file    Gets together: Not on file    Attends religious service: Not on file    Active member of club or organization: Not on file    Attends meetings of  clubs or organizations: Not on file    Relationship status: Not on file  . Intimate partner violence:    Fear of current or ex partner: Not on file    Emotionally abused: Not on file    Physically abused: Not on file    Forced sexual activity: Not on file  Other Topics Concern  . Not on file  Social History Narrative  . Not on file    Past Medical History, Surgical history, Social history, and Family history were reviewed and updated as appropriate.   Please see review of systems for further details on the patient's review from today.   Review of Systems:  Review of Systems  Constitutional: Positive for appetite change and fatigue. Negative for chills, diaphoresis and fever.  HENT: Negative for trouble swallowing.   Respiratory: Negative for cough and shortness of breath.   Gastrointestinal: Positive for nausea and vomiting. Negative for abdominal distention, abdominal pain, constipation and diarrhea.  Genitourinary: Negative for decreased urine volume and difficulty urinating.    Objective:   Physical Exam:  BP (!) 106/92 (BP Location: Left Arm, Patient Position: Sitting)   Pulse (!) 135 Comment: told RN  Temp 98.4 F (36.9 C) (Oral)   Resp 17   Ht 5\' 8"  (1.727 m)   SpO2 100%   BMI 28.30 kg/m   ECOG: 1  Physical Exam  Constitutional: No distress.  HENT:  Head: Normocephalic and atraumatic.  Mouth/Throat: Oropharynx is clear and moist.  Cardiovascular: Normal rate, regular rhythm and normal heart sounds. Exam reveals no gallop and no friction rub.  No murmur heard. Pulmonary/Chest: Effort normal. No stridor. No respiratory distress. She has decreased breath sounds in the right upper field, the right middle field and the right lower field. She has no wheezes. She has no rales.      Abdominal: Soft. Bowel sounds are normal. She exhibits no distension and no mass. There is no tenderness. There is no rebound and no guarding.  Neurological: She is alert. Coordination normal.  Skin: Skin is warm and dry. She is not diaphoretic.    Lab Review:     Component Value Date/Time   NA 137 03/17/2018 1208   NA 138 09/24/2017 1600   K 3.2 (L) 03/17/2018 1208   K 3.4 (L) 09/24/2017 1600   CL 101 03/17/2018 1208   CO2 28 03/17/2018 1208   CO2 25 09/24/2017 1600   GLUCOSE 115 (H) 03/17/2018 1208   GLUCOSE 125 09/24/2017 1600   BUN 8 03/17/2018 1208   BUN 9.5 09/24/2017 1600   CREATININE 0.67 03/17/2018 1208   CREATININE 0.8 09/24/2017 1600   CALCIUM 9.9 03/17/2018 1208   CALCIUM 9.0 09/24/2017 1600   PROT 8.2 (H) 03/17/2018 1208   PROT 7.4 09/24/2017 1600   ALBUMIN 3.0 (L) 03/17/2018 1208   ALBUMIN 3.5 09/24/2017 1600   AST 49 (H) 03/17/2018 1208   AST 31 09/24/2017 1600   ALT 49 (H) 03/17/2018 1208   ALT 24 09/24/2017 1600   ALKPHOS 164 (H) 03/17/2018 1208   ALKPHOS 92 09/24/2017 1600   BILITOT 0.7 03/17/2018 1208   BILITOT 0.40 09/24/2017 1600   GFRNONAA >60 03/17/2018 1208   GFRAA >60 03/17/2018 1208       Component Value Date/Time   WBC 2.2 (L) 03/17/2018 1208   WBC 4.9 01/04/2018 1115   RBC 3.21 (L) 03/17/2018 1208   HGB 10.1 (L) 03/17/2018 1208   HGB 8.3 (L) 09/24/2017 1601   HCT 30.3 (L)  03/17/2018 1208   HCT 24.5 (L) 09/24/2017 1601   PLT 43 (L)  03/17/2018 1208   PLT 92 (L) 09/24/2017 1601   PLT 300 02/11/2017 1632   MCV 94.3 03/17/2018 1208   MCV 90.7 09/24/2017 1601   MCH 31.4 03/17/2018 1208   MCHC 33.2 03/17/2018 1208   RDW 19.9 (H) 03/17/2018 1208   RDW 18.7 (H) 09/24/2017 1601   LYMPHSABS 0.4 (L) 03/17/2018 1208   LYMPHSABS 0.8 (L) 09/24/2017 1601   MONOABS 0.3 03/17/2018 1208   MONOABS 0.2 09/24/2017 1601   EOSABS 0.0 03/17/2018 1208   EOSABS 0.0 09/24/2017 1601   BASOSABS 0.0 03/17/2018 1208   BASOSABS 0.0 09/24/2017 1601   -------------------------------  Imaging from last 24 hours (if applicable):  Radiology interpretation: Dg Chest 1 View  Result Date: 02/23/2018 CLINICAL DATA:  Status post right-sided thoracentesis today EXAM: CHEST  1 VIEW COMPARISON:  Chest x-ray of January 07, 2018 FINDINGS: There remains considerable volume loss on the right. There is aeration of approximately 40-50% of the right lung. A large right pleural effusion is present. There is mild shift of the mediastinum toward the right which is chronic. The left lung is well-expanded and clear. The left heart border is normal. The pulmonary vascularity is not engorged. IMPRESSION: No postprocedure pneumothorax following a right thoracentesis. A large volume of pleural fluid remains. There is stable mild shift of the mediastinum toward the right. Electronically Signed   By: David  Martinique M.D.   On: 02/23/2018 15:19   Dg Chest Right Decubitus  Result Date: 03/17/2018 CLINICAL DATA:  Pleural effusion and lung cancer. EXAM: CHEST - RIGHT DECUBITUS COMPARISON:  02/23/2018 FINDINGS: Unchanged morphology of the large right pleural effusion at the base and along the lateral chest wall, capping the apex. The underlying lung is obscured. Normal heart size. Clear left lung. IMPRESSION: Large right pleural effusion with unchanged distribution when decubitus, consistent with loculation. Electronically Signed   By: Monte Fantasia M.D.   On: 03/17/2018 16:08   US  Thoracentesis Asp Pleural Space W/img Guide  Result Date: 02/23/2018 INDICATION: Patient with history of stage IV lung carcinoma, dyspnea, recurrent right pleural effusion. Request made for therapeutic right thoracentesis. EXAM: ULTRASOUND GUIDED THERAPEUTIC RIGHT THORACENTESIS MEDICATIONS: None COMPLICATIONS: None immediate. PROCEDURE: An ultrasound guided thoracentesis was thoroughly discussed with the patient and questions answered. The benefits, risks, alternatives and complications were also discussed. The patient understands and wishes to proceed with the procedure. Written consent was obtained. Ultrasound was performed to localize and mark an adequate pocket of fluid in the right chest. The area was then prepped and draped in the normal sterile fashion. 1% Lidocaine was used for local anesthesia. Under ultrasound guidance a 6 Fr Safe-T-Centesis catheter was introduced. Thoracentesis was performed. The catheter was removed and a dressing applied. FINDINGS: A total of approximately 800 cc of yellow fluid was removed. Due to patient coughing and chest discomfort only the above amount of fluid was removed today. IMPRESSION: Successful ultrasound guided therapeutic right thoracentesis yielding 800 cc of pleural fluid. Read by: Rowe Robert, PA-C Electronically Signed   By: Jerilynn Mages.  Shick M.D.   On: 02/23/2018 15:13

## 2018-03-17 NOTE — Patient Instructions (Signed)
Dehydration, Adult Dehydration is when there is not enough fluid or water in your body. This happens when you lose more fluids than you take in. Dehydration can range from mild to very bad. It should be treated right away to keep it from getting very bad. Symptoms of mild dehydration may include:  Thirst.  Dry lips.  Slightly dry mouth.  Dry, warm skin.  Dizziness. Symptoms of moderate dehydration may include:  Very dry mouth.  Muscle cramps.  Dark pee (urine). Pee may be the color of tea.  Your body making less pee.  Your eyes making fewer tears.  Heartbeat that is uneven or faster than normal (palpitations).  Headache.  Light-headedness, especially when you stand up from sitting.  Fainting (syncope). Symptoms of very bad dehydration may include:  Changes in skin, such as: ? Cold and clammy skin. ? Blotchy (mottled) or pale skin. ? Skin that does not quickly return to normal after being lightly pinched and let go (poor skin turgor).  Changes in body fluids, such as: ? Feeling very thirsty. ? Your eyes making fewer tears. ? Not sweating when body temperature is high, such as in hot weather. ? Your body making very little pee.  Changes in vital signs, such as: ? Weak pulse. ? Pulse that is more than 100 beats a minute when you are sitting still. ? Fast breathing. ? Low blood pressure.  Other changes, such as: ? Sunken eyes. ? Cold hands and feet. ? Confusion. ? Lack of energy (lethargy). ? Trouble waking up from sleep. ? Short-term weight loss. ? Unconsciousness. Follow these instructions at home:  If told by your doctor, drink an ORS: ? Make an ORS by using instructions on the package. ? Start by drinking small amounts, about  cup (120 mL) every 5-10 minutes. ? Slowly drink more until you have had the amount that your doctor said to have.  Drink enough clear fluid to keep your pee clear or pale yellow. If you were told to drink an ORS, finish the ORS  first, then start slowly drinking clear fluids. Drink fluids such as: ? Water. Do not drink only water by itself. Doing that can make the salt (sodium) level in your body get too low (hyponatremia). ? Ice chips. ? Fruit juice that you have added water to (diluted). ? Low-calorie sports drinks.  Avoid: ? Alcohol. ? Drinks that have a lot of sugar. These include high-calorie sports drinks, fruit juice that does not have water added, and soda. ? Caffeine. ? Foods that are greasy or have a lot of fat or sugar.  Take over-the-counter and prescription medicines only as told by your doctor.  Do not take salt tablets. Doing that can make the salt level in your body get too high (hypernatremia).  Eat foods that have minerals (electrolytes). Examples include bananas, oranges, potatoes, tomatoes, and spinach.  Keep all follow-up visits as told by your doctor. This is important. Contact a doctor if:  You have belly (abdominal) pain that: ? Gets worse. ? Stays in one area (localizes).  You have a rash.  You have a stiff neck.  You get angry or annoyed more easily than normal (irritability).  You are more sleepy than normal.  You have a harder time waking up than normal.  You feel: ? Weak. ? Dizzy. ? Very thirsty.  You have peed (urinated) only a small amount of very dark pee during 6-8 hours. Get help right away if:  You have symptoms of   very bad dehydration.  You cannot drink fluids without throwing up (vomiting).  Your symptoms get worse with treatment.  You have a fever.  You have a very bad headache.  You are throwing up or having watery poop (diarrhea) and it: ? Gets worse. ? Does not go away.  You have blood or something green (bile) in your throw-up.  You have blood in your poop (stool). This may cause poop to look black and tarry.  You have not peed in 6-8 hours.  You pass out (faint).  Your heart rate when you are sitting still is more than 100 beats a  minute.  You have trouble breathing. This information is not intended to replace advice given to you by your health care provider. Make sure you discuss any questions you have with your health care provider. Document Released: 07/04/2009 Document Revised: 03/27/2016 Document Reviewed: 11/01/2015 Elsevier Interactive Patient Education  2018 [employer].   Nausea, Adult Feeling sick to your stomach (nausea) means that your stomach is upset or you feel like you have to throw up (vomit). Feeling sick to your stomach is usually not serious, but it may be an early sign of a more serious medical problem. As you feel sicker to your stomach, it can lead to throwing up (vomiting). If you throw up, or if you are not able to drink enough fluids, there is a risk of dehydration. Dehydration can make you feel tired and thirsty, have a dry mouth, and pee (urinate) less often. Older adults and people who have other diseases or a weak defense (immune) system have a higher risk of dehydration. The main goal of treating this condition is to:  Limit how often you feel sick to your stomach.  Prevent throwing up and dehydration.  Follow these instructions at home: Follow instructions from your doctor about how to care for yourself at home. Eating and drinking Follow these recommendations as told by your doctor:  Take an oral rehydration solution (ORS). This is a drink that is sold at pharmacies and stores.  Drink clear fluids in small amounts as you are able, such as: ? Water. ? Ice chips. ? Fruit juice that has water added (diluted fruit juice). ? Low-calorie sports drinks.  Eat bland, easy to digest foods in small amounts as you are able, such as: ? Bananas. ? Applesauce. ? Rice. ? Lean meats. ? Toast. ? Crackers.  Avoid drinking fluids that contain a lot of sugar or caffeine.  Avoid alcohol.  Avoid spicy or fatty foods.  General instructions  Drink enough fluid to keep your pee (urine)  clear or pale yellow.  Wash your hands often. If you cannot use soap and water, use hand sanitizer.  Make sure that all people in your household wash their hands well and often.  Rest at home while you get better.  Take over-the-counter and prescription medicines only as told by your doctor.  Breathe slowly and deeply when you feel sick to your stomach.  Watch your condition for any changes.  Keep all follow-up visits as told by your doctor. This is important. Contact a doctor if:  You have a headache.  You have new symptoms.  You feel sicker to your stomach.  You have a fever.  You feel light-headed or dizzy.  You throw up.  You are not able to keep fluids down. Get help right away if:  You have pain in your chest, neck, arm, or jaw.  You feel very weak or  you pass out (faint).  You have throw up that is bright red or looks like coffee grounds.  You have bloody or black poop (stools), or poop that looks like tar.  You have a very bad headache, a stiff neck, or both.  You have very bad pain, cramping, or bloating in your belly.  You have a rash.  You have trouble breathing or you are breathing very quickly.  Your heart is beating very quickly.  Your skin feels cold and clammy.  You feel confused.  You have pain while peeing.  You have signs of dehydration, such as: ? Dark pee, or very little or no pee. ? Cracked lips. ? Dry mouth. ? Sunken eyes. ? Sleepiness. ? Weakness. These symptoms may be an emergency. Do not wait to see if the symptoms will go away. Get medical help right away. Call your local emergency services (911 in the [place] Do not drive yourself to the hospital. This information is not intended to replace advice given to you by your health care provider. Make sure you discuss any questions you have with your health care provider. Document Released: 08/27/2011 Document Revised: 02/13/2016 Document Reviewed: 05/14/2015 Elsevier Interactive  Patient Education  [employer].

## 2018-03-17 NOTE — Telephone Encounter (Signed)
Called pt re appts added per 6/26 sch msg - attempted to leave vm however the pt did not have vm set up.

## 2018-03-17 NOTE — Addendum Note (Signed)
Addended by: Harle Stanford on: 03/17/2018 05:11 PM   Modules accepted: Orders

## 2018-03-17 NOTE — Patient Instructions (Signed)
Excuse from Work, [employer], or Physical Activity Ms. Jamaia Slivinski's daughter needs to be excused from work on Thursday, June 27 as she was at a doctor's appointment with her mother.   Health Care Provider Name (printed): Sandi Mealy, MHS, PA-C  Health Care Provider (signature): ___________________________________________  Date: 03/17/2018  This information is not intended to replace advice given to you by your health care provider. Make sure you discuss any questions you have with your health care provider. Document Released: 03/03/2001 Document Revised: 08/21/2016 Document Reviewed: 04/09/2014 Elsevier Interactive Patient Education  [employer].

## 2018-03-18 ENCOUNTER — Ambulatory Visit (HOSPITAL_COMMUNITY)
Admission: RE | Admit: 2018-03-18 | Discharge: 2018-03-18 | Disposition: A | Discharge status: Home / Self Care | Payer: Medicare Other | Source: Ambulatory Visit | Attending: Radiology | Admitting: Radiology

## 2018-03-18 ENCOUNTER — Ambulatory Visit (HOSPITAL_COMMUNITY)
Admission: RE | Admit: 2018-03-18 | Discharge: 2018-03-18 | Disposition: A | Discharge status: Home / Self Care | Payer: Medicare Other | Source: Ambulatory Visit | Attending: Medical | Admitting: Medical

## 2018-03-18 DIAGNOSIS — Z9889 Other specified postprocedural states: Secondary | ICD-10-CM

## 2018-03-18 DIAGNOSIS — C3491 Malignant neoplasm of unspecified part of right bronchus or lung: Secondary | ICD-10-CM | POA: Insufficient documentation

## 2018-03-18 DIAGNOSIS — J90 Pleural effusion, not elsewhere classified: Secondary | ICD-10-CM | POA: Diagnosis not present

## 2018-03-18 DIAGNOSIS — J984 Other disorders of lung: Secondary | ICD-10-CM | POA: Diagnosis not present

## 2018-03-18 MED ORDER — LIDOCAINE HCL 1 % IJ SOLN
INTRAMUSCULAR | Status: AC
  Filled 2018-03-18: qty 20

## 2018-03-18 NOTE — Progress Notes (Signed)
These preliminary result these preliminary results were noted.  Awaiting final report.

## 2018-03-18 NOTE — Procedures (Signed)
Ultrasound-guided  therapeutic right thoracentesis performed yielding 800 cc of yellow fluid. No immediate complications. Follow-up chest x-ray pending. Due to pt chest discomfort only the above amount of fluid was removed today.

## 2018-03-21 ENCOUNTER — Ambulatory Visit (HOSPITAL_COMMUNITY)
Admission: RE | Admit: 2018-03-21 | Discharge: 2018-03-21 | Disposition: A | Discharge status: Home / Self Care | Payer: Medicare Other | Source: Ambulatory Visit | Attending: Nurse Practitioner | Admitting: Nurse Practitioner

## 2018-03-21 ENCOUNTER — Encounter (HOSPITAL_COMMUNITY): Payer: Self-pay

## 2018-03-21 DIAGNOSIS — R591 Generalized enlarged lymph nodes: Secondary | ICD-10-CM | POA: Diagnosis not present

## 2018-03-21 DIAGNOSIS — K769 Liver disease, unspecified: Secondary | ICD-10-CM | POA: Diagnosis not present

## 2018-03-21 DIAGNOSIS — C7989 Secondary malignant neoplasm of other specified sites: Secondary | ICD-10-CM | POA: Insufficient documentation

## 2018-03-21 DIAGNOSIS — D270 Benign neoplasm of right ovary: Secondary | ICD-10-CM | POA: Insufficient documentation

## 2018-03-21 DIAGNOSIS — C3491 Malignant neoplasm of unspecified part of right bronchus or lung: Secondary | ICD-10-CM | POA: Diagnosis not present

## 2018-03-21 DIAGNOSIS — J910 Malignant pleural effusion: Secondary | ICD-10-CM | POA: Diagnosis not present

## 2018-03-21 DIAGNOSIS — C3490 Malignant neoplasm of unspecified part of unspecified bronchus or lung: Secondary | ICD-10-CM | POA: Insufficient documentation

## 2018-03-21 MED ORDER — HEPARIN SOD (PORK) LOCK FLUSH 100 UNIT/ML IV SOLN
INTRAVENOUS | Status: AC
  Administered 2018-03-21: 500 [IU] via INTRAVENOUS
  Filled 2018-03-21: qty 5

## 2018-03-21 MED ORDER — IOPAMIDOL (ISOVUE-300) INJECTION 61%
30.0000 mL | Freq: Once | INTRAVENOUS | Status: AC | PRN
  Administered 2018-03-21: 30 mL via ORAL

## 2018-03-21 MED ORDER — IOPAMIDOL (ISOVUE-300) INJECTION 61%
INTRAVENOUS | Status: AC
  Filled 2018-03-21: qty 100

## 2018-03-21 MED ORDER — IOPAMIDOL (ISOVUE-300) INJECTION 61%
100.0000 mL | Freq: Once | INTRAVENOUS | Status: AC | PRN
  Administered 2018-03-21: 100 mL via INTRAVENOUS

## 2018-03-21 MED ORDER — IOPAMIDOL (ISOVUE-300) INJECTION 61%
INTRAVENOUS | Status: AC
  Administered 2018-03-21: 30 mL via ORAL
  Filled 2018-03-21: qty 30

## 2018-03-21 MED ORDER — HEPARIN SOD (PORK) LOCK FLUSH 100 UNIT/ML IV SOLN
500.0000 [IU] | Freq: Once | INTRAVENOUS | Status: AC
  Administered 2018-03-21: 500 [IU] via INTRAVENOUS

## 2018-03-23 ENCOUNTER — Inpatient Hospital Stay: Payer: Medicare Other | Attending: Internal Medicine | Admitting: Oncology

## 2018-03-23 ENCOUNTER — Inpatient Hospital Stay: Payer: Medicare Other

## 2018-03-23 ENCOUNTER — Other Ambulatory Visit: Payer: Medicare Other

## 2018-03-23 ENCOUNTER — Telehealth: Payer: Self-pay | Admitting: Oncology

## 2018-03-23 ENCOUNTER — Ambulatory Visit: Payer: Medicare Other

## 2018-03-23 ENCOUNTER — Encounter: Payer: Self-pay | Admitting: Oncology

## 2018-03-23 VITALS — BP 104/78 | HR 136 | Temp 97.7°F | Resp 18 | Ht 68.0 in | Wt 173.3 lb

## 2018-03-23 DIAGNOSIS — C7951 Secondary malignant neoplasm of bone: Secondary | ICD-10-CM | POA: Diagnosis not present

## 2018-03-23 DIAGNOSIS — D696 Thrombocytopenia, unspecified: Secondary | ICD-10-CM | POA: Insufficient documentation

## 2018-03-23 DIAGNOSIS — D649 Anemia, unspecified: Secondary | ICD-10-CM | POA: Insufficient documentation

## 2018-03-23 DIAGNOSIS — Z5111 Encounter for antineoplastic chemotherapy: Secondary | ICD-10-CM

## 2018-03-23 DIAGNOSIS — C3481 Malignant neoplasm of overlapping sites of right bronchus and lung: Secondary | ICD-10-CM | POA: Diagnosis not present

## 2018-03-23 DIAGNOSIS — J90 Pleural effusion, not elsewhere classified: Secondary | ICD-10-CM | POA: Diagnosis not present

## 2018-03-23 DIAGNOSIS — Z452 Encounter for adjustment and management of vascular access device: Secondary | ICD-10-CM | POA: Insufficient documentation

## 2018-03-23 DIAGNOSIS — C3491 Malignant neoplasm of unspecified part of right bronchus or lung: Secondary | ICD-10-CM

## 2018-03-23 DIAGNOSIS — G629 Polyneuropathy, unspecified: Secondary | ICD-10-CM | POA: Diagnosis not present

## 2018-03-23 DIAGNOSIS — R52 Pain, unspecified: Secondary | ICD-10-CM | POA: Insufficient documentation

## 2018-03-23 DIAGNOSIS — R634 Abnormal weight loss: Secondary | ICD-10-CM | POA: Diagnosis not present

## 2018-03-23 DIAGNOSIS — C342 Malignant neoplasm of middle lobe, bronchus or lung: Secondary | ICD-10-CM

## 2018-03-23 DIAGNOSIS — E440 Moderate protein-calorie malnutrition: Secondary | ICD-10-CM

## 2018-03-23 LAB — CMP (CANCER CENTER ONLY)
ALBUMIN: 3.2 g/dL — AB (ref 3.5–5.0)
ALT: 40 U/L (ref 0–44)
ANION GAP: 9 (ref 5–15)
AST: 49 U/L — ABNORMAL HIGH (ref 15–41)
Alkaline Phosphatase: 142 U/L — ABNORMAL HIGH (ref 38–126)
BILIRUBIN TOTAL: 0.7 mg/dL (ref 0.3–1.2)
BUN: 9 mg/dL (ref 6–20)
CO2: 28 mmol/L (ref 22–32)
Calcium: 10.3 mg/dL (ref 8.9–10.3)
Chloride: 98 mmol/L (ref 98–111)
Creatinine: 0.68 mg/dL (ref 0.44–1.00)
GFR, Estimated: >60 mL/min (ref >60)
GLUCOSE: 118 mg/dL — AB (ref 70–99)
POTASSIUM: 3.5 mmol/L (ref 3.5–5.1)
SODIUM: 135 mmol/L (ref 135–145)
TOTAL PROTEIN: 8.3 g/dL — AB (ref 6.5–8.1)

## 2018-03-23 LAB — CBC WITH DIFFERENTIAL (CANCER CENTER ONLY)
BASOS PCT: 1 %
Basophils Absolute: 0 10*3/uL (ref 0.0–0.1)
EOS ABS: 0 10*3/uL (ref 0.0–0.5)
Eosinophils Relative: 0 %
HCT: 30.3 % — ABNORMAL LOW (ref 34.8–46.6)
Hemoglobin: 10.1 g/dL — ABNORMAL LOW (ref 11.6–15.9)
Lymphocytes Relative: 13 %
Lymphs Abs: 0.5 10*3/uL — ABNORMAL LOW (ref 0.9–3.3)
MCH: 32 pg (ref 25.1–34.0)
MCHC: 33.4 g/dL (ref 31.5–36.0)
MCV: 95.9 fL (ref 79.5–101.0)
MONO ABS: 0.8 10*3/uL (ref 0.1–0.9)
Monocytes Relative: 19 %
Neutro Abs: 2.6 10*3/uL (ref 1.5–6.5)
Neutrophils Relative %: 67 %
Platelet Count: 174 10*3/uL (ref 145–400)
RBC: 3.16 MIL/uL — ABNORMAL LOW (ref 3.70–5.45)
RDW: 22.1 % — AB (ref 11.2–14.5)
WBC Count: 3.9 10*3/uL (ref 3.9–10.3)

## 2018-03-23 MED ORDER — SODIUM CHLORIDE 0.9% FLUSH
10.0000 mL | INTRAVENOUS | Status: DC | PRN
  Administered 2018-03-23: 10 mL
  Filled 2018-03-23: qty 10

## 2018-03-23 MED ORDER — SODIUM CHLORIDE 0.9 % IV SOLN
Freq: Once | INTRAVENOUS | Status: AC
  Administered 2018-03-23: 15:00:00 via INTRAVENOUS

## 2018-03-23 MED ORDER — PROCHLORPERAZINE MALEATE 10 MG PO TABS
10.0000 mg | ORAL_TABLET | Freq: Once | ORAL | Status: AC
  Administered 2018-03-23: 10 mg via ORAL

## 2018-03-23 MED ORDER — OXYCODONE-ACETAMINOPHEN 5-325 MG PO TABS
1.0000 | ORAL_TABLET | Freq: Once | ORAL | Status: AC
  Administered 2018-03-23: 1 via ORAL

## 2018-03-23 MED ORDER — MIRTAZAPINE 30 MG PO TABS: 30.0000 mg | ORAL_TABLET | Freq: Every day | ORAL | 1 refills | Status: DC

## 2018-03-23 MED ORDER — HEPARIN SOD (PORK) LOCK FLUSH 100 UNIT/ML IV SOLN
500.0000 [IU] | Freq: Once | INTRAVENOUS | Status: AC | PRN
Start: 2018-03-23 — End: 2018-03-23
  Administered 2018-03-23: 500 [IU]
  Filled 2018-03-23: qty 5

## 2018-03-23 MED ORDER — GEMCITABINE HCL CHEMO INJECTION 1 GM/26.3ML
2000.0000 mg | Freq: Once | INTRAVENOUS | Status: AC
  Administered 2018-03-23: 2000 mg via INTRAVENOUS
  Filled 2018-03-23: qty 52.6

## 2018-03-23 MED ORDER — SODIUM CHLORIDE 0.9 % IV SOLN: Freq: Once | INTRAVENOUS | Status: DC

## 2018-03-23 MED ORDER — ONDANSETRON HCL 4 MG/2ML IJ SOLN
8.0000 mg | Freq: Once | INTRAMUSCULAR | Status: AC
  Administered 2018-03-23: 8 mg via INTRAVENOUS

## 2018-03-23 MED ORDER — ONDANSETRON HCL 4 MG/2ML IJ SOLN
INTRAMUSCULAR | Status: AC
  Filled 2018-03-23: qty 4

## 2018-03-23 MED ORDER — METHYLPREDNISOLONE 4 MG PO TABS: ORAL_TABLET | ORAL | 0 refills | Status: DC

## 2018-03-23 MED ORDER — PROCHLORPERAZINE MALEATE 10 MG PO TABS
ORAL_TABLET | ORAL | Status: AC
  Filled 2018-03-23: qty 1

## 2018-03-23 MED ORDER — OXYCODONE-ACETAMINOPHEN 5-325 MG PO TABS
ORAL_TABLET | ORAL | Status: AC
  Filled 2018-03-23: qty 1

## 2018-03-23 MED FILL — MIRTAZAPINE 30 MG TABLET: 30 | 30 days supply | Qty: 30 | Fill #0

## 2018-03-23 MED FILL — METHYLPREDNISOLONE 4 MG TAB: 4 | 6 days supply | Qty: 21 | Fill #0

## 2018-03-23 NOTE — Progress Notes (Signed)
[facility] OFFICE PROGRESS NOTE  Theresa Eagles, PA-C [place] [address]  DIAGNOSIS: Metastatic non-small cell lung cancer initially diagnosed as Stage IIIA (T2b, N2, M0) non-small cell lung cancer, invasive poorly differentiated squamous cell carcinoma diagnosed in November 2016 and presented with large right hilar mass with collapse of the right middle lobe and extension into the right upper lobe and right lower lobe along the hilum with right paratracheal and subcarinal lymphadenopathy. She presented in July 2018 with metastatic intramuscular mass in the right posterior lateral chest wall, with biopsy confirmed squamous cell carcinoma. PDL1 Expression: 50%.  PRIOR THERAPY: 1) Concurrent chemoradiation with weekly carboplatin for AUC of 2 and paclitaxel 45 MG/M2. First dose 08/19/2015. Status post 5 cycles. Last cycle was given 10/07/2015 with significant response. 2) Consolidation chemotherapy with carboplatin for AUC of 5 and paclitaxel 175 MG/M2 every 3 weeks with Neulasta support. First dose 11/18/2015. Status post 3 cycles. 3) palliative radiotherapy to the intramuscular mass in the right posterior lateral chest wall under the care of Dr. Tammi Klippel. 4) Ketruda 200 mg IV every 3 weeks. First dose 05/12/2017. Status post 3 cycles. This was discontinued secondary to disease progression. 5)Systemic chemotherapy with carboplatin for AUC of 5 and paclitaxel 175 mg/M2 every 3 weeks with Neulasta support. First dose August 26, 2017. Status post6cycles.  CURRENT THERAPY: Gemzar 1000 mg/m day 1 and day 8 of 21-day cycle. First dose 01/12/2018.  Status post 3 cycles.  INTERVAL HISTORY: Theresa [name] 57 y.o. female returns for routine follow-up visit accompanied by her son.  The patient reports that she is having increased pain to her right upper back mass.  She is using oxycodone which is somewhat helpful.  She uses this about 4 times a day.  Patient also reports  a decreased appetite and has lost more weight since her last visit.  The patient underwent thoracentesis on 03/18/2018 with improvement of her shortness of breath.  She did have some soreness to the thoracentesis site over the weekend.  Patient denies fevers and chills.  She denies chest pain, cough, hemoptysis.  She reports intermittent nausea and dry heaves.  Denies constipation and diarrhea.  She had a restaging CT scan of the chest, abdomen, pelvis and is here to discuss the results.  MEDICAL HISTORY: Past Medical History:  Diagnosis Date  . Cancer ([place])    5 years ago - cervical cancer  . Encounter for antineoplastic chemotherapy 08/27/2015  . History of radiation therapy 08/19/2015 - 10/07/2015   Site/dose:   The patient's primary tumor and involved lymph nodes were treated to 66 Gy in 30 fractions.  . Hypokalemia 09/30/2015  . Non-small cell carcinoma of lung, stage 3 ([place]) 08/08/2015  . Paroxysmal atrial flutter ([place]) 09/16/2015  . Pneumonia     ALLERGIES:  has No Known Allergies.  MEDICATIONS:  Current Outpatient Medications  Medication Sig Dispense Refill  . gabapentin (NEURONTIN) 300 MG capsule Take 1 capsule in the morning, 1 capsule in the afternoon, and 2 capsules at bedtime. 120 capsule 0  . ibuprofen (ADVIL,MOTRIN) 800 MG tablet Take 1 tablet by mouth every 8 (eight) hours as needed.    . lidocaine (LIDODERM) 5 % Place 1 patch onto the skin daily. Remove & Discard patch within 12 hours or as directed by MD 30 patch 0  . lidocaine-prilocaine (EMLA) cream Apply 1 application topically as needed. 30 g 2  . LORazepam (ATIVAN) 0.5 MG tablet 0.5 mg sublingual p.o. every 6 hours  as needed for nausea and/or vomiting. 40 tablet 0  . methylPREDNISolone (MEDROL) 4 MG tablet Take 6 tabs on day 1, 5 tabs on day 2, 4 tabs on day 3, 3 tabs on day 4, 2 tabs on day 5, 1 tab on day 6, and then stop 21 tablet 0  . metoprolol tartrate (LOPRESSOR) 25 MG tablet Take 0.5 tablets (12.5 mg total) by  mouth 2 (two) times daily. 90 tablet 1  . mirtazapine (REMERON) 30 MG tablet Take 1 tablet (30 mg total) by mouth at bedtime. 30 tablet 1  . Multiple Vitamin (MULTIVITAMIN) capsule Take 1 capsule daily by mouth. 30 capsule 2  . ondansetron (ZOFRAN) 8 MG tablet Take 1 tablet (8 mg total) by mouth every 8 (eight) hours as needed for nausea or vomiting. 20 tablet 3  . oxyCODONE (OXY IR/ROXICODONE) 5 MG immediate release tablet Take 1 tablet (5 mg total) by mouth every 6 (six) hours as needed for severe pain. 60 tablet 0  . potassium chloride SA (K-DUR,KLOR-CON) 20 MEQ tablet Take 2 tabs every morning and 1 tab every evening. 90 tablet 0  . prochlorperazine (COMPAZINE) 10 MG tablet Take 1 tablet (10 mg total) by mouth every 6 (six) hours as needed for nausea or vomiting. 30 tablet 1   No current facility-administered medications for this visit.    Facility-Administered Medications Ordered in Other Visits  Medication Dose Route Frequency Provider Last Rate Last Dose  . 0.9 %  sodium chloride infusion   Intravenous Continuous Theresa [name] R, NP      . 0.9 %  sodium chloride infusion   Intravenous Continuous Theresa Bears, MD   Stopped at 05/31/17 1714  . gemcitabine (GEMZAR) 2,000 mg in sodium chloride 0.9 % 250 mL chemo infusion  2,000 mg Intravenous Once Theresa Bears, MD      . heparin lock flush 100 unit/mL  500 Units Intracatheter Once PRN Theresa Bears, MD      . ondansetron [facility]) injection 8 mg  8 mg Intravenous Once Theresa Bears, MD      . sodium chloride flush (NS) 0.9 % injection 10 mL  10 mL Intracatheter PRN Theresa Bears, MD        SURGICAL HISTORY:  Past Surgical History:  Procedure Laterality Date  . IR FLUORO GUIDE PORT INSERTION RIGHT  05/21/2017  . IR US GUIDE VASC ACCESS RIGHT  05/21/2017  . TUBAL LIGATION    . VIDEO BRONCHOSCOPY WITH ENDOBRONCHIAL ULTRASOUND N/A 08/02/2015   Procedure: VIDEO BRONCHOSCOPY WITH ENDOBRONCHIAL ULTRASOUND;  Surgeon: Theresa Nakayama, MD;  Location: [facility];  Service: Thoracic;  Laterality: N/A;  . WISDOM TOOTH EXTRACTION      REVIEW OF SYSTEMS:   Review of Systems  Constitutional: Negative for chills, fever.  Positive for fatigue, decreased appetite, and weight loss.  HENT:   Negative for mouth sores, nosebleeds, sore throat and trouble swallowing.   Eyes: Negative for eye problems and icterus.  Respiratory: Negative for cough, hemoptysis, and wheezing.  Positive for shortness of breath with exertion. Cardiovascular: Negative for chest pain and leg swelling.  Gastrointestinal: Negative for abdominal pain, constipation, diarrhea.  Positive for intermittent nausea and dry heaving.  Genitourinary: Negative for bladder incontinence, difficulty urinating, dysuria, frequency and hematuria.   Musculoskeletal: Negative for neck pain and neck stiffness. Positive for upper back pain secondary to mass. Skin: Negative for itching and rash.  Neurological: Negative for dizziness, extremity weakness, headaches, light-headedness and seizures.  Reports improved peripheral neuropathy of  her feet.  Hematological: Negative for adenopathy. Does not bruise/bleed easily.  Psychiatric/Behavioral: Negative for confusion, depression and sleep disturbance. The patient is not nervous/anxious.     PHYSICAL EXAMINATION:  Blood pressure 104/78, pulse (!) 136, temperature 97.7 F (36.5 C), temperature source Oral, resp. rate 18, height _0  (1.727 m), weight 173 lb 4.8 oz (78.6 kg), last menstrual period 03/21/2008, SpO2 100 %.  ECOG PERFORMANCE STATUS: 1 - Symptomatic but completely ambulatory  Physical Exam  Constitutional: Oriented to person, place, and time and well-developed, well-nourished, and in no distress. No distress.  HENT:  Head: Normocephalic and atraumatic.  Mouth/Throat: Oropharynx is clear and moist. No oropharyngeal exudate.  Eyes: Conjunctivae are normal. Right eye exhibits no discharge. Left eye exhibits no  discharge. No scleral icterus.  Neck: Normal range of motion. Neck supple.  Cardiovascular: Tachycardic, regular rhythm, normal heart sounds and intact distal pulses.   Pulmonary/Chest: Effort normal and breath sounds normal. No respiratory distress. No wheezes. No rales.  Abdominal: Soft. Bowel sounds are normal. Exhibits no distension and no mass. There is no tenderness.  Musculoskeletal: Normal range of motion. Exhibits no edema. Mass noted to the right upper back. Lymphadenopathy:    No cervical adenopathy.  Neurological: Alert and oriented to person, place, and time. Exhibits normal muscle tone. Coordination normal.  Skin: Skin is warm and dry. No rash noted. Not diaphoretic. No erythema. No pallor.  Psychiatric: Mood, memory and judgment normal.  Vitals reviewed.  LABORATORY DATA: Lab Results  Component Value Date   WBC 3.9 03/23/2018   HGB 10.1 (L) 03/23/2018   HCT 30.3 (L) 03/23/2018   MCV 95.9 03/23/2018   PLT 174 03/23/2018      Chemistry      Component Value Date/Time   NA 135 03/23/2018 1303   NA 138 09/24/2017 1600   K 3.5 03/23/2018 1303   K 3.4 (L) 09/24/2017 1600   CL 98 03/23/2018 1303   CO2 28 03/23/2018 1303   CO2 25 09/24/2017 1600   BUN 9 03/23/2018 1303   BUN 9.5 09/24/2017 1600   CREATININE 0.68 03/23/2018 1303   CREATININE 0.8 09/24/2017 1600      Component Value Date/Time   CALCIUM 10.3 03/23/2018 1303   CALCIUM 9.0 09/24/2017 1600   ALKPHOS 142 (H) 03/23/2018 1303   ALKPHOS 92 09/24/2017 1600   AST 49 (H) 03/23/2018 1303   AST 31 09/24/2017 1600   ALT 40 03/23/2018 1303   ALT 24 09/24/2017 1600   BILITOT 0.7 03/23/2018 1303   BILITOT 0.40 09/24/2017 1600       RADIOGRAPHIC STUDIES:  Dg Chest 1 View  Result Date: 03/18/2018 CLINICAL DATA:  Status post right thoracentesis. EXAM: CHEST  1 VIEW COMPARISON:  03/17/2018 FINDINGS: Improved aeration in the right lung following the right thoracentesis. There continues to be a large amount  pleural and parenchymal disease in the right chest. Persistent densities in the right hilar region. Port-A-Cath tip in the lower SVC. Heart size is normal. Negative for pneumothorax. Left lung is clear. IMPRESSION: Slightly improved aeration in the right lung following the thoracentesis. Negative for pneumothorax. Persistent pleural and parenchymal disease throughout the right chest. Electronically Signed   By: Markus Daft M.D.   On: 03/18/2018 12:32   Dg Chest 1 View  Result Date: 02/23/2018 CLINICAL DATA:  Status post right-sided thoracentesis today EXAM: CHEST  1 VIEW COMPARISON:  Chest x-ray of January 07, 2018 FINDINGS: There remains considerable volume loss on the right. There  is aeration of approximately 40-50% of the right lung. A large right pleural effusion is present. There is mild shift of the mediastinum toward the right which is chronic. The left lung is well-expanded and clear. The left heart border is normal. The pulmonary vascularity is not engorged. IMPRESSION: No postprocedure pneumothorax following a right thoracentesis. A large volume of pleural fluid remains. There is stable mild shift of the mediastinum toward the right. Electronically Signed   By: David  Martinique M.D.   On: 02/23/2018 15:19   Ct Chest W Contrast  Result Date: 03/22/2018 CLINICAL DATA:  57 year old female with history of non-small cell lung cancer diagnosed in October 2016. Recent thoracentesis. Pain in the lateral right chest wall. Follow-up study. EXAM: CT CHEST, ABDOMEN, AND PELVIS WITH CONTRAST TECHNIQUE: Multidetector CT imaging of the chest, abdomen and pelvis was performed following the standard protocol during bolus administration of intravenous contrast. CONTRAST:  173m ISOVUE-300 IOPAMIDOL (ISOVUE-300) INJECTION 61% COMPARISON:  None. FINDINGS: CT CHEST FINDINGS Cardiovascular: Heart size is normal. There is no significant pericardial fluid, thickening or pericardial calcification. No atherosclerotic calcifications  are noted in the thoracic aorta or the coronary arteries. 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 Right internal jugular single-lumen porta cath with tip terminating in the right atrium. Mediastinum/Nodes: No pathologically enlarged mediastinal or hilar lymph nodes. Esophagus is unremarkable in appearance. In the right axilla there is an enlarged enhancing 11 mm short axis lymph node which is new compared to the prior study (axial image 29 of series 2. Lungs/Pleura: There continues to be a moderate to large right pleural effusion which has decreased slightly in size compared to the prior study. The presence of gas within this pleural fluid collection is related to recent thoracentesis. Postradiation changes of masslike fibrosis are noted in the perihilar aspect of the right lung. In addition, there are several nodular areas in the periphery of the right lung, most evident near the apex, with the largest of these measuring 2.9 x 2.2 cm on axial image 8 of series 2 (previously only 2.5 x 2.0 cm on 12/30/2017), compatible with metastatic lesions. These appear associated with the pleura. There is also generalized pleural thickening and enhancement, indicative of a malignant right pleural effusion. Musculoskeletal: Again noted is a large soft tissue mass in the posterior aspect of the right chest wall which has increased in size compared to the prior study in currently measures 4.9 x 12.5 cm (axial image 38 of series 2). This lesion extends through the chest wall into the right hemithorax and involves portions of the posterior aspect of the right ninth and tenth ribs, as well as the tip of the right T9 transverse process, where there are permeative changes in the bone. In addition, more laterally in the right chest wall there is an enlarging 5.4 x 3.6 cm heterogeneously enhancing mass (axial image 27 of series 2) just anterior to the lower aspect of the scapula. CT ABDOMEN PELVIS  FINDINGS Hepatobiliary: There several well-defined low-attenuation lesions scattered throughout the liver, largest of which are compatible with simple cysts. The smaller lesions are too small to definitively characterize, but appear similar to prior studies and are favored to represent tiny cysts. No larger more aggressive appearing hepatic lesions are noted. No intra or extrahepatic biliary ductal dilatation. Gallbladder is normal in appearance. Pancreas: No pancreatic mass. No pancreatic ductal dilatation. No pancreatic or peripancreatic fluid or inflammatory changes. Spleen: Unremarkable. Adrenals/Urinary Tract: Bilateral kidneys and bilateral adrenal glands are normal in  appearance. There is no hydroureteronephrosis. Urinary bladder is normal in appearance. Stomach/Bowel: Normal appearance of the stomach. No pathologic dilatation of small bowel or colon. Normal appendix. Vascular/Lymphatic: Aortic atherosclerosis, without evidence of aneurysm or dissection in the abdominal or pelvic vasculature. No lymphadenopathy noted in the abdomen or pelvis. Reproductive: Uterus and left ovary are normal in appearance. Again noted is a predominantly fatty attenuation lesion in the right ovary measuring approximately 4.1 x 2.6 cm on today's examination, most compatible with an ovarian dermoid. Other: No significant volume of ascites.  No pneumoperitoneum. Musculoskeletal: There are no aggressive appearing lytic or blastic lesions noted in the visualized portions of the skeleton. IMPRESSION: 1. Today's study demonstrates slight progression of disease with enlargement of numerous pleural based lesions in the right hemithorax, persistent malignant right pleural effusion, and enlargement of right-sided chest wall metastases with developing right axillary lymphadenopathy. 2. No signs of metastatic disease in the abdomen or pelvis. 3. Right ovarian dermoid again noted. 4. Additional incidental findings, as above. Electronically  Signed   By: Vinnie Langton M.D.   On: 03/22/2018 08:39   Dg Chest Right Decubitus  Result Date: 03/17/2018 CLINICAL DATA:  Pleural effusion and lung cancer. EXAM: CHEST - RIGHT DECUBITUS COMPARISON:  02/23/2018 FINDINGS: Unchanged morphology of the large right pleural effusion at the base and along the lateral chest wall, capping the apex. The underlying lung is obscured. Normal heart size. Clear left lung. IMPRESSION: Large right pleural effusion with unchanged distribution when decubitus, consistent with loculation. Electronically Signed   By: Monte Fantasia M.D.   On: 03/17/2018 16:08   Ct Abdomen Pelvis W Contrast  Result Date: 03/22/2018 CLINICAL DATA:  57 year old female with history of non-small cell lung cancer diagnosed in October 2016. Recent thoracentesis. Pain in the lateral right chest wall. Follow-up study. EXAM: CT CHEST, ABDOMEN, AND PELVIS WITH CONTRAST TECHNIQUE: Multidetector CT imaging of the chest, abdomen and pelvis was performed following the standard protocol during bolus administration of intravenous contrast. CONTRAST:  152m ISOVUE-300 IOPAMIDOL (ISOVUE-300) INJECTION 61% COMPARISON:  None. FINDINGS: CT CHEST FINDINGS Cardiovascular: Heart size is normal. There is no significant pericardial fluid, thickening or pericardial calcification. No atherosclerotic calcifications are noted in the thoracic aorta or the coronary arteries. 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 Right internal jugular single-lumen porta cath with tip terminating in the right atrium. Mediastinum/Nodes: No pathologically enlarged mediastinal or hilar lymph nodes. Esophagus is unremarkable in appearance. In the right axilla there is an enlarged enhancing 11 mm short axis lymph node which is new compared to the prior study (axial image 29 of series 2. Lungs/Pleura: There continues to be a moderate to large right pleural effusion which has decreased slightly in size  compared to the prior study. The presence of gas within this pleural fluid collection is related to recent thoracentesis. Postradiation changes of masslike fibrosis are noted in the perihilar aspect of the right lung. In addition, there are several nodular areas in the periphery of the right lung, most evident near the apex, with the largest of these measuring 2.9 x 2.2 cm on axial image 8 of series 2 (previously only 2.5 x 2.0 cm on 12/30/2017), compatible with metastatic lesions. These appear associated with the pleura. There is also generalized pleural thickening and enhancement, indicative of a malignant right pleural effusion. Musculoskeletal: Again noted is a large soft tissue mass in the posterior aspect of the right chest wall which has increased in size compared to the prior study in  currently measures 4.9 x 12.5 cm (axial image 38 of series 2). This lesion extends through the chest wall into the right hemithorax and involves portions of the posterior aspect of the right ninth and tenth ribs, as well as the tip of the right T9 transverse process, where there are permeative changes in the bone. In addition, more laterally in the right chest wall there is an enlarging 5.4 x 3.6 cm heterogeneously enhancing mass (axial image 27 of series 2) just anterior to the lower aspect of the scapula. CT ABDOMEN PELVIS FINDINGS Hepatobiliary: There several well-defined low-attenuation lesions scattered throughout the liver, largest of which are compatible with simple cysts. The smaller lesions are too small to definitively characterize, but appear similar to prior studies and are favored to represent tiny cysts. No larger more aggressive appearing hepatic lesions are noted. No intra or extrahepatic biliary ductal dilatation. Gallbladder is normal in appearance. Pancreas: No pancreatic mass. No pancreatic ductal dilatation. No pancreatic or peripancreatic fluid or inflammatory changes. Spleen: Unremarkable.  Adrenals/Urinary Tract: Bilateral kidneys and bilateral adrenal glands are normal in appearance. There is no hydroureteronephrosis. Urinary bladder is normal in appearance. Stomach/Bowel: Normal appearance of the stomach. No pathologic dilatation of small bowel or colon. Normal appendix. Vascular/Lymphatic: Aortic atherosclerosis, without evidence of aneurysm or dissection in the abdominal or pelvic vasculature. No lymphadenopathy noted in the abdomen or pelvis. Reproductive: Uterus and left ovary are normal in appearance. Again noted is a predominantly fatty attenuation lesion in the right ovary measuring approximately 4.1 x 2.6 cm on today's examination, most compatible with an ovarian dermoid. Other: No significant volume of ascites.  No pneumoperitoneum. Musculoskeletal: There are no aggressive appearing lytic or blastic lesions noted in the visualized portions of the skeleton. IMPRESSION: 1. Today's study demonstrates slight progression of disease with enlargement of numerous pleural based lesions in the right hemithorax, persistent malignant right pleural effusion, and enlargement of right-sided chest wall metastases with developing right axillary lymphadenopathy. 2. No signs of metastatic disease in the abdomen or pelvis. 3. Right ovarian dermoid again noted. 4. Additional incidental findings, as above. Electronically Signed   By: Vinnie Langton M.D.   On: 03/22/2018 08:39   US Thoracentesis Asp Pleural Space W/img Guide  Result Date: 03/18/2018 INDICATION: Patient with history of stage IV lung carcinoma, dyspnea, recurrent right pleural effusion. Request made for therapeutic right thoracentesis. EXAM: ULTRASOUND GUIDED THERAPEUTIC RIGHT THORACENTESIS MEDICATIONS: None COMPLICATIONS: None immediate. PROCEDURE: An ultrasound guided thoracentesis was thoroughly discussed with the patient and questions answered. The benefits, risks, alternatives and complications were also discussed. The patient  understands and wishes to proceed with the procedure. Written consent was obtained. Ultrasound was performed to localize and mark an adequate pocket of fluid in the right chest. The area was then prepped and draped in the normal sterile fashion. 1% Lidocaine was used for local anesthesia. Under ultrasound guidance a 6 Fr Safe-T-Centesis catheter was introduced. Thoracentesis was performed. The catheter was removed and a dressing applied. FINDINGS: A total of approximately 800 cc of yellow fluid was removed. Due to patient chest discomfort only the above amount of fluid was removed today. IMPRESSION: Successful ultrasound guided therapeutic right thoracentesis yielding 800 cc of pleural fluid. Read by: Rowe Robert, PA-C Electronically Signed   By: Corrie Mckusick D.O.   On: 03/18/2018 12:03   US Thoracentesis Asp Pleural Space W/img Guide  Result Date: 02/23/2018 INDICATION: Patient with history of stage IV lung carcinoma, dyspnea, recurrent right pleural effusion. Request made for therapeutic  right thoracentesis. EXAM: ULTRASOUND GUIDED THERAPEUTIC RIGHT THORACENTESIS MEDICATIONS: None COMPLICATIONS: None immediate. PROCEDURE: An ultrasound guided thoracentesis was thoroughly discussed with the patient and questions answered. The benefits, risks, alternatives and complications were also discussed. The patient understands and wishes to proceed with the procedure. Written consent was obtained. Ultrasound was performed to localize and mark an adequate pocket of fluid in the right chest. The area was then prepped and draped in the normal sterile fashion. 1% Lidocaine was used for local anesthesia. Under ultrasound guidance a 6 Fr Safe-T-Centesis catheter was introduced. Thoracentesis was performed. The catheter was removed and a dressing applied. FINDINGS: A total of approximately 800 cc of yellow fluid was removed. Due to patient coughing and chest discomfort only the above amount of fluid was removed today.  IMPRESSION: Successful ultrasound guided therapeutic right thoracentesis yielding 800 cc of pleural fluid. Read by: Rowe Robert, PA-C Electronically Signed   By: Jerilynn Mages.  Shick M.D.   On: 02/23/2018 15:13     ASSESSMENT/PLAN:  Metastatic primary lung cancer, right [facility]) This is a very pleasant 57 year old African-American female with metastatic non-small cell lung cancer that was initially diagnosed as stage IIIAnon-small cell lung cancer, squamous cell carcinoma presented with large right hilar mass and mediastinal lymphadenopathy diagnosed in November 2016. She status post concurrent chemoradiation followed by consolidation chemotherapy. The patient was found recently to have intramuscular metastases and the right posterior chest wall that was biopsy-proven to be squamous cell carcinoma with PDL 1 expression 50%. The patient was a started on treatment with immunotherapy with Ketruda (pembrolizumab) status post 3 cycles. Unfortunately imaging studies after the treatment with Keytruda showed evidence for disease progression. The patient is currently then received treatment with carboplatin for AUC of 5 and paclitaxel 175 mg/M2 status post6cycles. Shetoleratedthis treatment very well especially after reducing the dose of paclitaxel to 150 mg/M2 starting from cycle #2. She a restaging CT scan of the chest, abdomen, pelvis which showed evidence of disease progression.  The patient is now on of single agent Gemzar given day 1 and day 8 of a 21-day cycle.   Status post 3 cycles.    She is tolerating her treatment fairly well with the exception of decreased appetite, weight loss, and nausea. Had a restaging CT scan of the chest, abdomen, pelvis and is here to discuss the results.  The patient was seen with Dr. Earlie Server.  CT scan results and images were reviewed with the patient and her son.  Although the impression on the CT scan indicates disease progression in the right chest wall metastases, the  method in which this was measured in the prior CT scan was not the same as it was measured at this time.  We have remeasured the mass and it is stable to slightly improved.  We show this to the patient and her son on the images.  Recommend that she continue on single agent Gemzar on day 1 and day 8 of a 21-day cycle.  We will plan to restage her after 3 additional cycles.  She will proceed with day 1 of cycle 4 of her treatment today as scheduled. The patient will follow-up in 3 weeks for evaluation prior to cycle #5.  For pain, recommend she continue on oxycodone.  She will let us know if this is not controlling her pain he can make adjustments.  For her weight loss and decreased appetite, she was given a Medrol Dosepak.  She was also started on Remeron 30 mg at  bedtime.  For the peripheral neuropathy,she will continue on gabapentin 300 mg at bedtime.  The patient was advised to call immediately if she has any concerning symptoms in the interval. The patient voices understanding of current disease status and treatment options and is in agreement with the current care plan. All questions were answered. The patient knows to call the clinic with any problems, questions or concerns. We can certainly see the patient much sooner if necessary.   No orders of the defined types were placed in this encounter.  Mikey Bussing, DNP, AGPCNP-BC, AOCNP 03/23/18  ADDENDUM: Hematology/Oncology Attending: I had a face-to-face encounter with the patient.  I recommended her care plan.  This is a very pleasant 57 years old African-American female with metastatic squamous cell carcinoma status post treatment with immunotherapy with Keytruda discontinued secondary to disease progression this was followed by induction systemic chemotherapy with carboplatin and paclitaxel.  Unfortunately the patient has evidence for disease progression in her treatment was also complicated with significant peripheral neuropathy.   She is currently undergoing second line treatment with single agent gemcitabine status post 3 cycles.  She has been tolerating her treatment much better The patient continues to have pain on the right back close to the chest wall metastasis.  She had repeat CT scan of the chest, abdomen and pelvis performed recently.  I personally and independently reviewed the scan images and discussed the result and showed the images to the patient today.  The measurement of the chest wall mass was not done exactly in the same dimensions as before and was reported to be larger on the current scan but when remeasured in the same dimensions it looks slightly smaller or similar. I recommended for the patient to continue her current treatment with single agent gemcitabine and she will proceed with cycle #4 today.  I will continue to monitor the chest wall mass closely. For pain management she will continue with the current pain medication. For the peripheral neuropathy she is currently on gabapentin. For the weight loss and lack of appetite, we gave the patient a Medrol Dosepak and also started her on Remeron 30 mg p.o. nightly for depression and weight loss. The patient will come back for follow-up visit in 3 weeks for evaluation before starting cycle #5. She was advised to call immediately if she has any concerning symptoms in the interval.  Disclaimer: This note was dictated with voice recognition software. Similar sounding words can inadvertently be transcribed and may be missed upon review. Eilleen Kempf, MD 03/27/18 .

## 2018-03-23 NOTE — Patient Instructions (Signed)
[facility] Discharge Instructions for Patients Receiving Chemotherapy  Today you received the following chemotherapy agents:  Gemcitabine  To help prevent nausea and vomiting after your treatment, we encourage you to take your nausea medication as prescribed.    If you develop nausea and vomiting that is not controlled by your nausea medication, call the clinic.   BELOW ARE SYMPTOMS THAT SHOULD BE REPORTED IMMEDIATELY:  *FEVER GREATER THAN 100.5 F  *CHILLS WITH OR WITHOUT FEVER  NAUSEA AND VOMITING THAT IS NOT CONTROLLED WITH YOUR NAUSEA MEDICATION  *UNUSUAL SHORTNESS OF BREATH  *UNUSUAL BRUISING OR BLEEDING  TENDERNESS IN MOUTH AND THROAT WITH OR WITHOUT PRESENCE OF ULCERS  *URINARY PROBLEMS  *BOWEL PROBLEMS  UNUSUAL RASH Items with * indicate a potential emergency and should be followed up as soon as possible.  Feel free to call the clinic should you have any questions or concerns. The clinic phone number is [phone].  Please show the CHEMO ALERT CARD at check-in to the Emergency Department and triage nurse.   

## 2018-03-23 NOTE — Progress Notes (Signed)
1600  -  Pt stated pain in her back was much better -  4/10 .  Stated back pain prior to pain med was  8/10.

## 2018-03-23 NOTE — Telephone Encounter (Signed)
Scheduled appt per 7/3 los- gave patient AVS and calender per los.

## 2018-03-23 NOTE — Assessment & Plan Note (Signed)
This is a very pleasant 57 year old African-American female with metastatic non-small cell lung cancer that was initially diagnosed as stage IIIAnon-small cell lung cancer, squamous cell carcinoma presented with large right hilar mass and mediastinal lymphadenopathy diagnosed in November 2016. She status post concurrent chemoradiation followed by consolidation chemotherapy. The patient was found recently to have intramuscular metastases and the right posterior chest wall that was biopsy-proven to be squamous cell carcinoma with PDL 1 expression 50%. The patient was a started on treatment with immunotherapy with Ketruda (pembrolizumab) status post 3 cycles. Unfortunately imaging studies after the treatment with Keytruda showed evidence for disease progression. The patient is currently then received treatment with carboplatin for AUC of 5 and paclitaxel 175 mg/M2 status post6cycles. Shetoleratedthis treatment very well especially after reducing the dose of paclitaxel to 150 mg/M2 starting from cycle #2. She a restaging CT scan of the chest, abdomen, pelvis which showed evidence of disease progression.  The patient is now on of single agent Gemzar given day 1 and day 8 of a 21-day cycle.   Status post 3 cycles.    She is tolerating her treatment fairly well with the exception of decreased appetite, weight loss, and nausea. Had a restaging CT scan of the chest, abdomen, pelvis and is here to discuss the results.  The patient was seen with Dr. Earlie Server.  CT scan results and images were reviewed with the patient and her son.  Although the impression on the CT scan indicates disease progression in the right chest wall metastases, the method in which this was measured in the prior CT scan was not the same as it was measured at this time.  We have remeasured the mass and it is stable to slightly improved.  We show this to the patient and her son on the images.  Recommend that she continue on single agent  Gemzar on day 1 and day 8 of a 21-day cycle.  We will plan to restage her after 3 additional cycles.  She will proceed with day 1 of cycle 4 of her treatment today as scheduled. The patient will follow-up in 3 weeks for evaluation prior to cycle #5.  For pain, recommend she continue on oxycodone.  She will let us know if this is not controlling her pain he can make adjustments.  For her weight loss and decreased appetite, she was given a Medrol Dosepak.  She was also started on Remeron 30 mg at bedtime.  For the peripheral neuropathy,she will continue on gabapentin 300 mg at bedtime.  The patient was advised to call immediately if she has any concerning symptoms in the interval. The patient voices understanding of current disease status and treatment options and is in agreement with the current care plan. All questions were answered. The patient knows to call the clinic with any problems, questions or concerns. We can certainly see the patient much sooner if necessary.

## 2018-03-25 ENCOUNTER — Telehealth: Payer: Self-pay | Admitting: Medical Oncology

## 2018-03-25 ENCOUNTER — Other Ambulatory Visit: Payer: Self-pay | Admitting: Oncology

## 2018-03-25 DIAGNOSIS — C3491 Malignant neoplasm of unspecified part of right bronchus or lung: Secondary | ICD-10-CM

## 2018-03-25 MED ORDER — OXYCODONE HCL 5 MG PO TABS: 5.0000 mg | ORAL_TABLET | Freq: Four times a day (QID) | ORAL | 0 refills | Status: DC | PRN

## 2018-03-25 MED FILL — oxyCODONE HCL 5 MG TABS: 5 | 15 days supply | Qty: 60 | Fill #0

## 2018-03-25 NOTE — Telephone Encounter (Signed)
Refill requested. E-scribed per K Curcio.LVM for [facility] to [facility] to [facility].

## 2018-03-25 NOTE — Telephone Encounter (Signed)
I left a second message to pick up rx at [facility] that was escribed by Erasmo Downer.

## 2018-03-25 NOTE — Telephone Encounter (Signed)
Daughter contacted to pick up medication at [facility]

## 2018-03-30 ENCOUNTER — Inpatient Hospital Stay: Payer: Medicare Other

## 2018-03-31 ENCOUNTER — Telehealth: Payer: Self-pay | Admitting: Internal Medicine

## 2018-03-31 NOTE — Telephone Encounter (Signed)
Called patient back and returned message regarding r/s 7/10 - pt is aware of new appt scheduled - per MM appt should be rescheduled to this week .

## 2018-04-01 ENCOUNTER — Inpatient Hospital Stay: Payer: Medicare Other

## 2018-04-01 VITALS — BP 120/80 | HR 108 | Temp 99.2°F | Resp 18

## 2018-04-01 DIAGNOSIS — C7951 Secondary malignant neoplasm of bone: Secondary | ICD-10-CM | POA: Diagnosis not present

## 2018-04-01 DIAGNOSIS — Z5111 Encounter for antineoplastic chemotherapy: Secondary | ICD-10-CM | POA: Diagnosis not present

## 2018-04-01 DIAGNOSIS — Z95828 Presence of other vascular implants and grafts: Secondary | ICD-10-CM

## 2018-04-01 DIAGNOSIS — C3491 Malignant neoplasm of unspecified part of right bronchus or lung: Secondary | ICD-10-CM

## 2018-04-01 DIAGNOSIS — C3481 Malignant neoplasm of overlapping sites of right bronchus and lung: Secondary | ICD-10-CM | POA: Diagnosis not present

## 2018-04-01 DIAGNOSIS — G629 Polyneuropathy, unspecified: Secondary | ICD-10-CM | POA: Diagnosis not present

## 2018-04-01 DIAGNOSIS — R634 Abnormal weight loss: Secondary | ICD-10-CM | POA: Diagnosis not present

## 2018-04-01 DIAGNOSIS — R52 Pain, unspecified: Secondary | ICD-10-CM | POA: Diagnosis not present

## 2018-04-01 LAB — CMP (CANCER CENTER ONLY)
ALK PHOS: 154 U/L — AB (ref 38–126)
ALT: 60 U/L — AB (ref 0–44)
AST: 43 U/L — ABNORMAL HIGH (ref 15–41)
Albumin: 3 g/dL — ABNORMAL LOW (ref 3.5–5.0)
Anion gap: 12 (ref 5–15)
BILIRUBIN TOTAL: 0.6 mg/dL (ref 0.3–1.2)
BUN: 9 mg/dL (ref 6–20)
CALCIUM: 9.7 mg/dL (ref 8.9–10.3)
CHLORIDE: 102 mmol/L (ref 98–111)
CO2: 25 mmol/L (ref 22–32)
Creatinine: 0.73 mg/dL (ref 0.44–1.00)
GFR, Est AFR Am: >60 mL/min (ref >60)
Glucose, Bld: 113 mg/dL — ABNORMAL HIGH (ref 70–99)
Potassium: 3.5 mmol/L (ref 3.5–5.1)
Sodium: 139 mmol/L (ref 135–145)
TOTAL PROTEIN: 7.9 g/dL (ref 6.5–8.1)

## 2018-04-01 LAB — CBC WITH DIFFERENTIAL (CANCER CENTER ONLY)
BASOS ABS: 0 10*3/uL (ref 0.0–0.1)
BASOS PCT: 1 %
EOS PCT: 0 %
Eosinophils Absolute: 0 10*3/uL (ref 0.0–0.5)
HCT: 28.4 % — ABNORMAL LOW (ref 34.8–46.6)
Hemoglobin: 9.3 g/dL — ABNORMAL LOW (ref 11.6–15.9)
LYMPHS PCT: 10 %
Lymphs Abs: 0.4 10*3/uL — ABNORMAL LOW (ref 0.9–3.3)
MCH: 31.8 pg (ref 25.1–34.0)
MCHC: 32.9 g/dL (ref 31.5–36.0)
MCV: 96.6 fL (ref 79.5–101.0)
Monocytes Absolute: 0.4 10*3/uL (ref 0.1–0.9)
Monocytes Relative: 11 %
Neutro Abs: 3 10*3/uL (ref 1.5–6.5)
Neutrophils Relative %: 78 %
PLATELETS: 117 10*3/uL — AB (ref 145–400)
RBC: 2.94 MIL/uL — AB (ref 3.70–5.45)
RDW: 22 % — ABNORMAL HIGH (ref 11.2–14.5)
WBC: 3.8 10*3/uL — AB (ref 3.9–10.3)

## 2018-04-01 MED ORDER — SODIUM CHLORIDE 0.9 % IV SOLN
Freq: Once | INTRAVENOUS | Status: AC
  Administered 2018-04-01: 15:00:00 via INTRAVENOUS

## 2018-04-01 MED ORDER — SODIUM CHLORIDE 0.9% FLUSH
10.0000 mL | INTRAVENOUS | Status: DC | PRN
  Administered 2018-04-01: 10 mL via INTRAVENOUS
  Filled 2018-04-01: qty 10

## 2018-04-01 MED ORDER — PROCHLORPERAZINE MALEATE 10 MG PO TABS
10.0000 mg | ORAL_TABLET | Freq: Once | ORAL | Status: AC
  Administered 2018-04-01: 10 mg via ORAL

## 2018-04-01 MED ORDER — SODIUM CHLORIDE 0.9% FLUSH
10.0000 mL | INTRAVENOUS | Status: DC | PRN
Start: 2018-04-01 — End: 2018-04-01
  Administered 2018-04-01: 10 mL
  Filled 2018-04-01: qty 10

## 2018-04-01 MED ORDER — SODIUM CHLORIDE 0.9 % IV SOLN
2000.0000 mg | Freq: Once | INTRAVENOUS | Status: AC
  Administered 2018-04-01: 2000 mg via INTRAVENOUS
  Filled 2018-04-01: qty 52.6

## 2018-04-01 MED ORDER — PROCHLORPERAZINE MALEATE 10 MG PO TABS
ORAL_TABLET | ORAL | Status: AC
  Filled 2018-04-01: qty 1

## 2018-04-01 MED ORDER — HEPARIN SOD (PORK) LOCK FLUSH 100 UNIT/ML IV SOLN
500.0000 [IU] | Freq: Once | INTRAVENOUS | Status: AC | PRN
  Administered 2018-04-01: 500 [IU]
  Filled 2018-04-01: qty 5

## 2018-04-01 NOTE — Patient Instructions (Signed)
[facility] °Discharge Instructions for Patients Receiving Chemotherapy ° °Today you received the following chemotherapy agents Gemzar ° °To help prevent nausea and vomiting after your treatment, we encourage you to take your nausea medication as directed. °  °If you develop nausea and vomiting that is not controlled by your nausea medication, call the clinic.  ° °BELOW ARE SYMPTOMS THAT SHOULD BE REPORTED IMMEDIATELY: °· *FEVER GREATER THAN 100.5 F °· *CHILLS WITH OR WITHOUT FEVER °· NAUSEA AND VOMITING THAT IS NOT CONTROLLED WITH YOUR NAUSEA MEDICATION °· *UNUSUAL SHORTNESS OF BREATH °· *UNUSUAL BRUISING OR BLEEDING °· TENDERNESS IN MOUTH AND THROAT WITH OR WITHOUT PRESENCE OF ULCERS °· *URINARY PROBLEMS °· *BOWEL PROBLEMS °· UNUSUAL RASH °Items with * indicate a potential emergency and should be followed up as soon as possible. ° °Feel free to call the clinic should you have any questions or concerns. The clinic phone number is [phone]. ° °Please show the CHEMO ALERT CARD at check-in to the Emergency Department and triage nurse. ° ° °

## 2018-04-01 NOTE — Progress Notes (Signed)
Okay to treat with a pulse of 108 per Dr. Benay Spice.

## 2018-04-06 ENCOUNTER — Other Ambulatory Visit: Payer: Self-pay | Admitting: *Deleted

## 2018-04-06 DIAGNOSIS — C342 Malignant neoplasm of middle lobe, bronchus or lung: Secondary | ICD-10-CM

## 2018-04-06 NOTE — Telephone Encounter (Signed)
Pt called request for walker.Order entered. Faxed to [facility]

## 2018-04-11 NOTE — Assessment & Plan Note (Addendum)
This is a very pleasant 57 year old African-American female with metastatic non-small cell lung cancer that was initially diagnosed as stage IIIAnon-small cell lung cancer, squamous cell carcinoma presented with large right hilar mass and mediastinal lymphadenopathy diagnosed in November 2016. She status post concurrent chemoradiation followed by consolidation chemotherapy. The patient was found recently to have intramuscular metastases and the right posterior chest wall that was biopsy-proven to be squamous cell carcinoma with PDL 1 expression 50%. The patient was a started on treatment with immunotherapy with Ketruda (pembrolizumab) status post 3 cycles. Unfortunately imaging studies after the treatment with Keytruda showed evidence for disease progression. The patient is currentlythen receivedtreatment with carboplatin for AUC of 5 and paclitaxel 175 mg/M2 status post6cycles. Shetoleratedthis treatment very well especially after reducing the dose of paclitaxel to 150 mg/M2 starting from cycle #2. She a restaging CT scan of the chest, abdomen, pelviswhich showed evidence of disease progression. The patient is now onof single agent Gemzar given day 1 and day 8 of a 21-day cycle.Status post 4 cycles. She is tolerating her treatment fairly well with no concerning complaints. Labs reviewed.  ANC is 1.4 and hemoglobin is 7.8.  Heart rate is 128 which is consistent with her baseline.  Recommend for her to proceed with day 1 of cycle #5 of her treatment today as scheduled.  She will return next week for repeat labs and day 8 of her treatment. The patient will have a follow-up visit in 3 weeks for evaluation prior to starting cycle #6 of her treatment.  For anemia, the patient will receive 2 units of packed red blood cells in our office tomorrow.  Orders have been entered.  Type and screen has been ordered today.  For deconditioning, I have made a referral for physical therapy.  We have ordered  a rolling walker for the patient.  For pain, recommend she continue on oxycodone.  She will let us know if this is not controlling her pain he can make adjustments.  For her weight loss and decreased appetite, she will continue on Remeron 30 mg at bedtime.  For the peripheral neuropathy,she will continue on gabapentin 300 mg at bedtime.  The patient was advised to call immediately if she has any concerning symptoms in the interval. The patient voices understanding of current disease status and treatment options and is in agreement with the current care plan. All questions were answered. The patient knows to call the clinic with any problems, questions or concerns. We can certainly see the patient much sooner if necessary.

## 2018-04-11 NOTE — Progress Notes (Signed)
[facility] OFFICE PROGRESS NOTE  [name], PA-C [place] [address]  DIAGNOSIS: Metastatic non-small cell lung cancer initially diagnosed as Stage IIIA (T2b, N2, M0) non-small cell lung cancer, invasive poorly differentiated squamous cell carcinoma diagnosed in November 2016 and presented with large right hilar mass with collapse of the right middle lobe and extension into the right upper lobe and right lower lobe along the hilum with right paratracheal and subcarinal lymphadenopathy. She presented in July 2018 with metastatic intramuscular mass in the right posterior lateral chest wall, with biopsy confirmed squamous cell carcinoma. PDL1 Expression: 50%.  PRIOR THERAPY: 1) Concurrent chemoradiation with weekly carboplatin for AUC of 2 and paclitaxel 45 MG/M2. First dose 08/19/2015. Status post 5 cycles. Last cycle was given 10/07/2015 with significant response. 2) Consolidation chemotherapy with carboplatin for AUC of 5 and paclitaxel 175 MG/M2 every 3 weeks with Neulasta support. First dose 11/18/2015. Status post 3 cycles. 3) palliative radiotherapy to the intramuscular mass in the right posterior lateral chest wall under the care of Dr. Tammi Klippel. 4) Ketruda 200 mg IV every 3 weeks. First dose 05/12/2017. Status post 3 cycles. This was discontinued secondary to disease progression. 5)Systemic chemotherapy with carboplatin for AUC of 5 and paclitaxel 175 mg/M2 every 3 weeks with Neulasta support. First dose August 26, 2017. Status post6cycles.  CURRENT THERAPY: Gemzar 1000 mg/m day 1 and day 8 of 21-day cycle. First dose 01/12/2018.  Status post 4 cycles.  INTERVAL HISTORY: Theresa [name] 57 y.o. female returns for routine follow-up visit accompanied by her son.  The patient is feeling fine today and has no specific complaints except for intermittent right arm pain.  She thinks is related to her pleural effusion reaccumulating.  She reports that her  shortness of breath is stable and does not need a thoracentesis at this time.  She denies fevers and chills.  Denies chest pain, shortness of breath at rest, cough, hemoptysis.  Denies nausea, vomiting, constipation, diarrhea.  Reports that her pain is well controlled with oxycodone twice a day.  Denies recent weight loss or night sweats.  The patient is requesting a referral for physical therapy and a rolling walker due to deconditioning.  The patient is here for evaluation prior to starting cycle #5 of her chemotherapy.  MEDICAL HISTORY: Past Medical History:  Diagnosis Date  . Cancer ([place])    5 years ago - cervical cancer  . Encounter for antineoplastic chemotherapy 08/27/2015  . History of radiation therapy 08/19/2015 - 10/07/2015   Site/dose:   The patient's primary tumor and involved lymph nodes were treated to 66 Gy in 30 fractions.  . Hypokalemia 09/30/2015  . Non-small cell carcinoma of lung, stage 3 ([facility]) 08/08/2015  . Paroxysmal atrial flutter ([facility]) 09/16/2015  . Pneumonia     ALLERGIES:  has No Known Allergies.  MEDICATIONS:  Current Outpatient Medications  Medication Sig Dispense Refill  . gabapentin (NEURONTIN) 300 MG capsule Take 1 capsule in the morning, 1 capsule in the afternoon, and 2 capsules at bedtime. 120 capsule 0  . ibuprofen (ADVIL,MOTRIN) 800 MG tablet Take 1 tablet by mouth every 8 (eight) hours as needed.    . lidocaine (LIDODERM) 5 % Place 1 patch onto the skin daily. Remove & Discard patch within 12 hours or as directed by MD 30 patch 0  . lidocaine-prilocaine (EMLA) cream Apply 1 application topically as needed. 30 g 2  . LORazepam (ATIVAN) 0.5 MG tablet 0.5 mg sublingual p.o. every 6  hours as needed for nausea and/or vomiting. 40 tablet 0  . metoprolol tartrate (LOPRESSOR) 25 MG tablet Take 0.5 tablets (12.5 mg total) by mouth 2 (two) times daily. 90 tablet 1  . Multiple Vitamin (MULTIVITAMIN) capsule Take 1 capsule daily by mouth. 30 capsule 2  . ondansetron  (ZOFRAN) 8 MG tablet Take 1 tablet (8 mg total) by mouth every 8 (eight) hours as needed for nausea or vomiting. 20 tablet 3  . oxyCODONE (OXY IR/ROXICODONE) 5 MG immediate release tablet Take 1 tablet (5 mg total) by mouth every 6 (six) hours as needed for severe pain. 60 tablet 0  . potassium chloride SA (K-DUR,KLOR-CON) 20 MEQ tablet Take 2 tabs every morning and 1 tab every evening. 90 tablet 0  . prochlorperazine (COMPAZINE) 10 MG tablet Take 1 tablet (10 mg total) by mouth every 6 (six) hours as needed for nausea or vomiting. 30 tablet 1  . mirtazapine (REMERON) 30 MG tablet Take 1 tablet (30 mg total) by mouth at bedtime. (Patient not taking: Reported on 04/12/2018) 30 tablet 1   No current facility-administered medications for this visit.    Facility-Administered Medications Ordered in Other Visits  Medication Dose Route Frequency Provider Last Rate Last Dose  . 0.9 %  sodium chloride infusion   Intravenous Continuous Sharbel Sahagun R, NP      . 0.9 %  sodium chloride infusion   Intravenous Continuous Curt Bears, MD   Stopped at 05/31/17 1714  . ondansetron (ZOFRAN) injection 8 mg  8 mg Intravenous Once Curt Bears, MD        SURGICAL HISTORY:  Past Surgical History:  Procedure Laterality Date  . IR FLUORO GUIDE PORT INSERTION RIGHT  05/21/2017  . IR US GUIDE VASC ACCESS RIGHT  05/21/2017  . TUBAL LIGATION    . VIDEO BRONCHOSCOPY WITH ENDOBRONCHIAL ULTRASOUND N/A 08/02/2015   Procedure: VIDEO BRONCHOSCOPY WITH ENDOBRONCHIAL ULTRASOUND;  Surgeon: [name], MD;  Location: [facility];  Service: Thoracic;  Laterality: N/A;  . WISDOM TOOTH EXTRACTION      REVIEW OF SYSTEMS:   Review of Systems  Constitutional: Negative for appetite change, chills, fever and unexpected weight change. Positive for mild fatigue. HENT:   Negative for mouth sores, nosebleeds, sore throat and trouble swallowing.   Eyes: Negative for eye problems and icterus.  Respiratory: Negative for  cough, hemoptysis, shortness of breath at rest and wheezing.   Cardiovascular: Negative for chest pain and leg swelling.  Gastrointestinal: Negative for abdominal pain, constipation, diarrhea, nausea and vomiting.  Genitourinary: Negative for bladder incontinence, difficulty urinating, dysuria, frequency and hematuria.   Musculoskeletal: Negative for back pain, neck pain and neck stiffness.  Skin: Negative for itching and rash.  Neurological: Negative for dizziness, extremity weakness, headaches, light-headedness and seizures.  Hematological: Negative for adenopathy. Does not bruise/bleed easily.  Psychiatric/Behavioral: Negative for confusion, depression and sleep disturbance. The patient is not nervous/anxious.     PHYSICAL EXAMINATION:  Blood pressure (!) 141/94, pulse (!) 128, temperature 98.5 F (36.9 C), temperature source Oral, resp. rate 18, height _0  (1.727 m), weight 177 lb (80.3 kg), last menstrual period 03/21/2008, SpO2 98 %.  ECOG PERFORMANCE STATUS: 1 - Symptomatic but completely ambulatory  Physical Exam  Constitutional: Oriented to person, place, and time and well-developed, well-nourished, and in no distress. No distress.  HENT:  Head: Normocephalic and atraumatic.  Mouth/Throat: Oropharynx is clear and moist. No oropharyngeal exudate.  Eyes: Conjunctivae are normal. Right eye exhibits no discharge. Left eye  exhibits no discharge. No scleral icterus.  Neck: Normal range of motion. Neck supple.  Cardiovascular: Tachycardic, regular rhythm, normal heart sounds and intact distal pulses.   Pulmonary/Chest: Effort normal.  Diminished breath sounds right mid lung down to right base.  No respiratory distress. No wheezes. No rales.  Abdominal: Soft. Bowel sounds are normal. Exhibits no distension and no mass. There is no tenderness.  Musculoskeletal: Normal range of motion. Exhibits no edema.  Lymphadenopathy:    No cervical adenopathy.  Neurological: Alert and oriented to  person, place, and time. Exhibits normal muscle tone. Coordination normal.  Skin: Skin is warm and dry. No rash noted. Not diaphoretic. No erythema. No pallor.  Psychiatric: Mood, memory and judgment normal.  Vitals reviewed.  LABORATORY DATA: Lab Results  Component Value Date   WBC 2.3 (L) 04/12/2018   HGB 7.8 (L) 04/12/2018   HCT 23.9 (L) 04/12/2018   MCV 97.8 04/12/2018   PLT 111 (L) 04/12/2018      Chemistry      Component Value Date/Time   NA 137 04/12/2018 1239   NA 138 09/24/2017 1600   K 3.3 (L) 04/12/2018 1239   K 3.4 (L) 09/24/2017 1600   CL 102 04/12/2018 1239   CO2 24 04/12/2018 1239   CO2 25 09/24/2017 1600   BUN 5 (L) 04/12/2018 1239   BUN 9.5 09/24/2017 1600   CREATININE 0.71 04/12/2018 1239   CREATININE 0.8 09/24/2017 1600      Component Value Date/Time   CALCIUM 9.6 04/12/2018 1239   CALCIUM 9.0 09/24/2017 1600   ALKPHOS 137 (H) 04/12/2018 1239   ALKPHOS 92 09/24/2017 1600   AST 35 04/12/2018 1239   AST 31 09/24/2017 1600   ALT 25 04/12/2018 1239   ALT 24 09/24/2017 1600   BILITOT 0.6 04/12/2018 1239   BILITOT 0.40 09/24/2017 1600       RADIOGRAPHIC STUDIES:  Dg Chest 1 View  Result Date: 03/18/2018 CLINICAL DATA:  Status post right thoracentesis. EXAM: CHEST  1 VIEW COMPARISON:  03/17/2018 FINDINGS: Improved aeration in the right lung following the right thoracentesis. There continues to be a large amount pleural and parenchymal disease in the right chest. Persistent densities in the right hilar region. Port-A-Cath tip in the lower SVC. Heart size is normal. Negative for pneumothorax. Left lung is clear. IMPRESSION: Slightly improved aeration in the right lung following the thoracentesis. Negative for pneumothorax. Persistent pleural and parenchymal disease throughout the right chest. Electronically Signed   By: Markus Daft M.D.   On: 03/18/2018 12:32   Ct Chest W Contrast  Result Date: 03/22/2018 CLINICAL DATA:  57 year old female with history of  non-small cell lung cancer diagnosed in October 2016. Recent thoracentesis. Pain in the lateral right chest wall. Follow-up study. EXAM: CT CHEST, ABDOMEN, AND PELVIS WITH CONTRAST TECHNIQUE: Multidetector CT imaging of the chest, abdomen and pelvis was performed following the standard protocol during bolus administration of intravenous contrast. CONTRAST:  12m ISOVUE-300 IOPAMIDOL (ISOVUE-300) INJECTION 61% COMPARISON:  None. FINDINGS: CT CHEST FINDINGS Cardiovascular: Heart size is normal. There is no significant pericardial fluid, thickening or pericardial calcification. No atherosclerotic calcifications are noted in the thoracic aorta or the coronary arteries. 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 Right internal jugular single-lumen porta cath with tip terminating in the right atrium. Mediastinum/Nodes: No pathologically enlarged mediastinal or hilar lymph nodes. Esophagus is unremarkable in appearance. In the right axilla there is an enlarged enhancing 11 mm short axis lymph node which is  new compared to the prior study (axial image 29 of series 2. Lungs/Pleura: There continues to be a moderate to large right pleural effusion which has decreased slightly in size compared to the prior study. The presence of gas within this pleural fluid collection is related to recent thoracentesis. Postradiation changes of masslike fibrosis are noted in the perihilar aspect of the right lung. In addition, there are several nodular areas in the periphery of the right lung, most evident near the apex, with the largest of these measuring 2.9 x 2.2 cm on axial image 8 of series 2 (previously only 2.5 x 2.0 cm on 12/30/2017), compatible with metastatic lesions. These appear associated with the pleura. There is also generalized pleural thickening and enhancement, indicative of a malignant right pleural effusion. Musculoskeletal: Again noted is a large soft tissue mass in the posterior  aspect of the right chest wall which has increased in size compared to the prior study in currently measures 4.9 x 12.5 cm (axial image 38 of series 2). This lesion extends through the chest wall into the right hemithorax and involves portions of the posterior aspect of the right ninth and tenth ribs, as well as the tip of the right T9 transverse process, where there are permeative changes in the bone. In addition, more laterally in the right chest wall there is an enlarging 5.4 x 3.6 cm heterogeneously enhancing mass (axial image 27 of series 2) just anterior to the lower aspect of the scapula. CT ABDOMEN PELVIS FINDINGS Hepatobiliary: There several well-defined low-attenuation lesions scattered throughout the liver, largest of which are compatible with simple cysts. The smaller lesions are too small to definitively characterize, but appear similar to prior studies and are favored to represent tiny cysts. No larger more aggressive appearing hepatic lesions are noted. No intra or extrahepatic biliary ductal dilatation. Gallbladder is normal in appearance. Pancreas: No pancreatic mass. No pancreatic ductal dilatation. No pancreatic or peripancreatic fluid or inflammatory changes. Spleen: Unremarkable. Adrenals/Urinary Tract: Bilateral kidneys and bilateral adrenal glands are normal in appearance. There is no hydroureteronephrosis. Urinary bladder is normal in appearance. Stomach/Bowel: Normal appearance of the stomach. No pathologic dilatation of small bowel or colon. Normal appendix. Vascular/Lymphatic: Aortic atherosclerosis, without evidence of aneurysm or dissection in the abdominal or pelvic vasculature. No lymphadenopathy noted in the abdomen or pelvis. Reproductive: Uterus and left ovary are normal in appearance. Again noted is a predominantly fatty attenuation lesion in the right ovary measuring approximately 4.1 x 2.6 cm on today's examination, most compatible with an ovarian dermoid. Other: No significant  volume of ascites.  No pneumoperitoneum. Musculoskeletal: There are no aggressive appearing lytic or blastic lesions noted in the visualized portions of the skeleton. IMPRESSION: 1. Today's study demonstrates slight progression of disease with enlargement of numerous pleural based lesions in the right hemithorax, persistent malignant right pleural effusion, and enlargement of right-sided chest wall metastases with developing right axillary lymphadenopathy. 2. No signs of metastatic disease in the abdomen or pelvis. 3. Right ovarian dermoid again noted. 4. Additional incidental findings, as above. Electronically Signed   By: Vinnie Langton M.D.   On: 03/22/2018 08:39   Dg Chest Right Decubitus  Result Date: 03/17/2018 CLINICAL DATA:  Pleural effusion and lung cancer. EXAM: CHEST - RIGHT DECUBITUS COMPARISON:  02/23/2018 FINDINGS: Unchanged morphology of the large right pleural effusion at the base and along the lateral chest wall, capping the apex. The underlying lung is obscured. Normal heart size. Clear left lung. IMPRESSION: Large right pleural effusion with  unchanged distribution when decubitus, consistent with loculation. Electronically Signed   By: Monte Fantasia M.D.   On: 03/17/2018 16:08   Ct Abdomen Pelvis W Contrast  Result Date: 03/22/2018 CLINICAL DATA:  57 year old female with history of non-small cell lung cancer diagnosed in October 2016. Recent thoracentesis. Pain in the lateral right chest wall. Follow-up study. EXAM: CT CHEST, ABDOMEN, AND PELVIS WITH CONTRAST TECHNIQUE: Multidetector CT imaging of the chest, abdomen and pelvis was performed following the standard protocol during bolus administration of intravenous contrast. CONTRAST:  163m ISOVUE-300 IOPAMIDOL (ISOVUE-300) INJECTION 61% COMPARISON:  None. FINDINGS: CT CHEST FINDINGS Cardiovascular: Heart size is normal. There is no significant pericardial fluid, thickening or pericardial calcification. No atherosclerotic calcifications  are noted in the thoracic aorta or the coronary arteries. 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 Right internal jugular single-lumen porta cath with tip terminating in the right atrium. Mediastinum/Nodes: No pathologically enlarged mediastinal or hilar lymph nodes. Esophagus is unremarkable in appearance. In the right axilla there is an enlarged enhancing 11 mm short axis lymph node which is new compared to the prior study (axial image 29 of series 2. Lungs/Pleura: There continues to be a moderate to large right pleural effusion which has decreased slightly in size compared to the prior study. The presence of gas within this pleural fluid collection is related to recent thoracentesis. Postradiation changes of masslike fibrosis are noted in the perihilar aspect of the right lung. In addition, there are several nodular areas in the periphery of the right lung, most evident near the apex, with the largest of these measuring 2.9 x 2.2 cm on axial image 8 of series 2 (previously only 2.5 x 2.0 cm on 12/30/2017), compatible with metastatic lesions. These appear associated with the pleura. There is also generalized pleural thickening and enhancement, indicative of a malignant right pleural effusion. Musculoskeletal: Again noted is a large soft tissue mass in the posterior aspect of the right chest wall which has increased in size compared to the prior study in currently measures 4.9 x 12.5 cm (axial image 38 of series 2). This lesion extends through the chest wall into the right hemithorax and involves portions of the posterior aspect of the right ninth and tenth ribs, as well as the tip of the right T9 transverse process, where there are permeative changes in the bone. In addition, more laterally in the right chest wall there is an enlarging 5.4 x 3.6 cm heterogeneously enhancing mass (axial image 27 of series 2) just anterior to the lower aspect of the scapula. CT ABDOMEN PELVIS  FINDINGS Hepatobiliary: There several well-defined low-attenuation lesions scattered throughout the liver, largest of which are compatible with simple cysts. The smaller lesions are too small to definitively characterize, but appear similar to prior studies and are favored to represent tiny cysts. No larger more aggressive appearing hepatic lesions are noted. No intra or extrahepatic biliary ductal dilatation. Gallbladder is normal in appearance. Pancreas: No pancreatic mass. No pancreatic ductal dilatation. No pancreatic or peripancreatic fluid or inflammatory changes. Spleen: Unremarkable. Adrenals/Urinary Tract: Bilateral kidneys and bilateral adrenal glands are normal in appearance. There is no hydroureteronephrosis. Urinary bladder is normal in appearance. Stomach/Bowel: Normal appearance of the stomach. No pathologic dilatation of small bowel or colon. Normal appendix. Vascular/Lymphatic: Aortic atherosclerosis, without evidence of aneurysm or dissection in the abdominal or pelvic vasculature. No lymphadenopathy noted in the abdomen or pelvis. Reproductive: Uterus and left ovary are normal in appearance. Again noted is a predominantly fatty attenuation  lesion in the right ovary measuring approximately 4.1 x 2.6 cm on today's examination, most compatible with an ovarian dermoid. Other: No significant volume of ascites.  No pneumoperitoneum. Musculoskeletal: There are no aggressive appearing lytic or blastic lesions noted in the visualized portions of the skeleton. IMPRESSION: 1. Today's study demonstrates slight progression of disease with enlargement of numerous pleural based lesions in the right hemithorax, persistent malignant right pleural effusion, and enlargement of right-sided chest wall metastases with developing right axillary lymphadenopathy. 2. No signs of metastatic disease in the abdomen or pelvis. 3. Right ovarian dermoid again noted. 4. Additional incidental findings, as above. Electronically  Signed   By: Vinnie Langton M.D.   On: 03/22/2018 08:39   US Thoracentesis Asp Pleural Space W/img Guide  Result Date: 03/18/2018 INDICATION: Patient with history of stage IV lung carcinoma, dyspnea, recurrent right pleural effusion. Request made for therapeutic right thoracentesis. EXAM: ULTRASOUND GUIDED THERAPEUTIC RIGHT THORACENTESIS MEDICATIONS: None COMPLICATIONS: None immediate. PROCEDURE: An ultrasound guided thoracentesis was thoroughly discussed with the patient and questions answered. The benefits, risks, alternatives and complications were also discussed. The patient understands and wishes to proceed with the procedure. Written consent was obtained. Ultrasound was performed to localize and mark an adequate pocket of fluid in the right chest. The area was then prepped and draped in the normal sterile fashion. 1% Lidocaine was used for local anesthesia. Under ultrasound guidance a 6 Fr Safe-T-Centesis catheter was introduced. Thoracentesis was performed. The catheter was removed and a dressing applied. FINDINGS: A total of approximately 800 cc of yellow fluid was removed. Due to patient chest discomfort only the above amount of fluid was removed today. IMPRESSION: Successful ultrasound guided therapeutic right thoracentesis yielding 800 cc of pleural fluid. Read by: Rowe Robert, PA-C Electronically Signed   By: Corrie Mckusick D.O.   On: 03/18/2018 12:03     ASSESSMENT/PLAN:  Metastatic primary lung cancer, right [facility]) This is a very pleasant 58 year old African-American female with metastatic non-small cell lung cancer that was initially diagnosed as stage IIIAnon-small cell lung cancer, squamous cell carcinoma presented with large right hilar mass and mediastinal lymphadenopathy diagnosed in November 2016. She status post concurrent chemoradiation followed by consolidation chemotherapy. The patient was found recently to have intramuscular metastases and the right posterior chest wall that  was biopsy-proven to be squamous cell carcinoma with PDL 1 expression 50%. The patient was a started on treatment with immunotherapy with Ketruda (pembrolizumab) status post 3 cycles. Unfortunately imaging studies after the treatment with Keytruda showed evidence for disease progression. The patient is currentlythen receivedtreatment with carboplatin for AUC of 5 and paclitaxel 175 mg/M2 status post6cycles. Shetoleratedthis treatment very well especially after reducing the dose of paclitaxel to 150 mg/M2 starting from cycle #2. She a restaging CT scan of the chest, abdomen, pelviswhich showed evidence of disease progression. The patient is now onof single agent Gemzar given day 1 and day 8 of a 21-day cycle.Status post 4 cycles. She is tolerating her treatment fairly well with no concerning complaints. Labs reviewed.  ANC is 1.4 and hemoglobin is 7.8.  Heart rate is 128 which is consistent with her baseline.  Recommend for her to proceed with day 1 of cycle #5 of her treatment today as scheduled.  She will return next week for repeat labs and day 8 of her treatment. The patient will have a follow-up visit in 3 weeks for evaluation prior to starting cycle #6 of her treatment.  For anemia, the patient will  receive 2 units of packed red blood cells in our office tomorrow.  Orders have been entered.  Type and screen has been ordered today.  For deconditioning, I have made a referral for physical therapy.  We have ordered a rolling walker for the patient.  For pain, recommend she continue on oxycodone.  She will let us know if this is not controlling her pain he can make adjustments.  For her weight loss and decreased appetite, she will continue on Remeron 30 mg at bedtime.  For the peripheral neuropathy,she will continue on gabapentin 300 mg at bedtime.  The patient was advised to call immediately if she has any concerning symptoms in the interval. The patient voices understanding  of current disease status and treatment options and is in agreement with the current care plan. All questions were answered. The patient knows to call the clinic with any problems, questions or concerns. We can certainly see the patient much sooner if necessary.   Orders Placed This Encounter  Procedures  . Ambulatory Referral for DME    Referral Priority:   Routine    Referral Type:   Durable Medical Equipment Purchase    Number of Visits Requested:   1  . Ambulatory referral to Physical Therapy    Referral Priority:   Routine    Referral Type:   Physical Medicine    Referral Reason:   Specialty Services Required    Requested Specialty:   Physical Therapy    Number of Visits Requested:   1  . Practitioner attestation of consent    I, the ordering practitioner, attest that I have discussed with the patient the benefits, risks, side effects, alternatives, likelihood of achieving goals and potential problems during recovery for the procedure listed.    Standing Status:   Future    Standing Expiration Date:   04/12/2019    Order Specific Question:   Procedure    Answer:   Blood Product(s)  . Complete patient signature process for consent form    Standing Status:   Future    Standing Expiration Date:   04/12/2019  . Care order/instruction    Transfuse Parameters    Standing Status:   Future    Standing Expiration Date:   04/12/2019  . Sample to Blood Bank    Standing Status:   Future    Standing Expiration Date:   04/13/2019  . Type and screen    Standing Status:   Future    Number of Occurrences:   1    Standing Expiration Date:   04/13/2019   Mikey Bussing, DNP, AGPCNP-BC, AOCNP 04/12/18

## 2018-04-12 ENCOUNTER — Inpatient Hospital Stay (HOSPITAL_BASED_OUTPATIENT_CLINIC_OR_DEPARTMENT_OTHER): Payer: Medicare Other | Admitting: Oncology

## 2018-04-12 ENCOUNTER — Telehealth: Payer: Self-pay | Admitting: Oncology

## 2018-04-12 ENCOUNTER — Inpatient Hospital Stay: Payer: Medicare Other

## 2018-04-12 ENCOUNTER — Encounter: Payer: Self-pay | Admitting: Oncology

## 2018-04-12 VITALS — BP 141/94 | HR 128 | Temp 98.5°F | Resp 18 | Ht 68.0 in | Wt 177.0 lb

## 2018-04-12 DIAGNOSIS — C3481 Malignant neoplasm of overlapping sites of right bronchus and lung: Secondary | ICD-10-CM | POA: Diagnosis not present

## 2018-04-12 DIAGNOSIS — R52 Pain, unspecified: Secondary | ICD-10-CM | POA: Diagnosis not present

## 2018-04-12 DIAGNOSIS — R5381 Other malaise: Secondary | ICD-10-CM

## 2018-04-12 DIAGNOSIS — E860 Dehydration: Secondary | ICD-10-CM

## 2018-04-12 DIAGNOSIS — D649 Anemia, unspecified: Secondary | ICD-10-CM | POA: Diagnosis not present

## 2018-04-12 DIAGNOSIS — C3491 Malignant neoplasm of unspecified part of right bronchus or lung: Secondary | ICD-10-CM

## 2018-04-12 DIAGNOSIS — R634 Abnormal weight loss: Secondary | ICD-10-CM

## 2018-04-12 DIAGNOSIS — C7951 Secondary malignant neoplasm of bone: Secondary | ICD-10-CM | POA: Diagnosis not present

## 2018-04-12 DIAGNOSIS — D630 Anemia in neoplastic disease: Secondary | ICD-10-CM

## 2018-04-12 DIAGNOSIS — G629 Polyneuropathy, unspecified: Secondary | ICD-10-CM

## 2018-04-12 DIAGNOSIS — Z5111 Encounter for antineoplastic chemotherapy: Secondary | ICD-10-CM

## 2018-04-12 LAB — CBC WITH DIFFERENTIAL (CANCER CENTER ONLY)
Basophils Absolute: 0 10*3/uL (ref 0.0–0.1)
Basophils Relative: 1 %
Eosinophils Absolute: 0 10*3/uL (ref 0.0–0.5)
Eosinophils Relative: 1 %
HEMATOCRIT: 23.9 % — AB (ref 34.8–46.6)
Hemoglobin: 7.8 g/dL — ABNORMAL LOW (ref 11.6–15.9)
LYMPHS ABS: 0.3 10*3/uL — AB (ref 0.9–3.3)
LYMPHS PCT: 13 %
MCH: 32.1 pg (ref 25.1–34.0)
MCHC: 32.8 g/dL (ref 31.5–36.0)
MCV: 97.8 fL (ref 79.5–101.0)
MONO ABS: 0.5 10*3/uL (ref 0.1–0.9)
Monocytes Relative: 22 %
NEUTROS ABS: 1.4 10*3/uL — AB (ref 1.5–6.5)
Neutrophils Relative %: 63 %
Platelet Count: 111 10*3/uL — ABNORMAL LOW (ref 145–400)
RBC: 2.45 MIL/uL — ABNORMAL LOW (ref 3.70–5.45)
RDW: 23.4 % — AB (ref 11.2–14.5)
WBC Count: 2.3 10*3/uL — ABNORMAL LOW (ref 3.9–10.3)

## 2018-04-12 LAB — CMP (CANCER CENTER ONLY)
ALK PHOS: 137 U/L — AB (ref 38–126)
ALT: 25 U/L (ref 0–44)
ANION GAP: 11 (ref 5–15)
AST: 35 U/L (ref 15–41)
Albumin: 2.8 g/dL — ABNORMAL LOW (ref 3.5–5.0)
BILIRUBIN TOTAL: 0.6 mg/dL (ref 0.3–1.2)
BUN: 5 mg/dL — AB (ref 6–20)
CO2: 24 mmol/L (ref 22–32)
Calcium: 9.6 mg/dL (ref 8.9–10.3)
Chloride: 102 mmol/L (ref 98–111)
Creatinine: 0.71 mg/dL (ref 0.44–1.00)
GFR, Est AFR Am: >60 mL/min (ref >60)
Glucose, Bld: 140 mg/dL — ABNORMAL HIGH (ref 70–99)
POTASSIUM: 3.3 mmol/L — AB (ref 3.5–5.1)
Sodium: 137 mmol/L (ref 135–145)
TOTAL PROTEIN: 7.6 g/dL (ref 6.5–8.1)

## 2018-04-12 LAB — PREPARE RBC (CROSSMATCH)

## 2018-04-12 MED ORDER — SODIUM CHLORIDE 0.9% FLUSH
10.0000 mL | INTRAVENOUS | Status: DC | PRN
  Administered 2018-04-12: 10 mL
  Filled 2018-04-12: qty 10

## 2018-04-12 MED ORDER — HEPARIN SOD (PORK) LOCK FLUSH 100 UNIT/ML IV SOLN
500.0000 [IU] | Freq: Once | INTRAVENOUS | Status: AC | PRN
  Administered 2018-04-12: 500 [IU]
  Filled 2018-04-12: qty 5

## 2018-04-12 MED ORDER — SODIUM CHLORIDE 0.9 % IV SOLN
Freq: Once | INTRAVENOUS | Status: AC
  Administered 2018-04-12: 15:00:00 via INTRAVENOUS

## 2018-04-12 MED ORDER — PROCHLORPERAZINE MALEATE 10 MG PO TABS
10.0000 mg | ORAL_TABLET | Freq: Once | ORAL | Status: AC
  Administered 2018-04-12: 10 mg via ORAL

## 2018-04-12 MED ORDER — GEMCITABINE HCL CHEMO INJECTION 1 GM/26.3ML
2000.0000 mg | Freq: Once | INTRAVENOUS | Status: AC
  Administered 2018-04-12: 2000 mg via INTRAVENOUS
  Filled 2018-04-12: qty 52.6

## 2018-04-12 MED ORDER — PROCHLORPERAZINE MALEATE 10 MG PO TABS
ORAL_TABLET | ORAL | Status: AC
Start: 2018-04-12 — End: ?
  Filled 2018-04-12: qty 1

## 2018-04-12 NOTE — Telephone Encounter (Signed)
Schedule dappt per 7/23 los - gave patient aVS and calender per los.

## 2018-04-12 NOTE — Patient Instructions (Signed)
[facility] °Discharge Instructions for Patients Receiving Chemotherapy ° °Today you received the following chemotherapy agents Gemzar ° °To help prevent nausea and vomiting after your treatment, we encourage you to take your nausea medication as directed. °  °If you develop nausea and vomiting that is not controlled by your nausea medication, call the clinic.  ° °BELOW ARE SYMPTOMS THAT SHOULD BE REPORTED IMMEDIATELY: °· *FEVER GREATER THAN 100.5 F °· *CHILLS WITH OR WITHOUT FEVER °· NAUSEA AND VOMITING THAT IS NOT CONTROLLED WITH YOUR NAUSEA MEDICATION °· *UNUSUAL SHORTNESS OF BREATH °· *UNUSUAL BRUISING OR BLEEDING °· TENDERNESS IN MOUTH AND THROAT WITH OR WITHOUT PRESENCE OF ULCERS °· *URINARY PROBLEMS °· *BOWEL PROBLEMS °· UNUSUAL RASH °Items with * indicate a potential emergency and should be followed up as soon as possible. ° °Feel free to call the clinic should you have any questions or concerns. The clinic phone number is [phone]. ° °Please show the CHEMO ALERT CARD at check-in to the Emergency Department and triage nurse. ° ° °

## 2018-04-12 NOTE — Progress Notes (Signed)
Per Mikey Bussing, NP ok to order DME walker with seat.  coorindating with [facility].   Ok to treat with Gemzar today, per Myrtha Mantis, NP (see notes in treatment plan)

## 2018-04-13 ENCOUNTER — Inpatient Hospital Stay: Payer: Medicare Other

## 2018-04-13 ENCOUNTER — Other Ambulatory Visit: Payer: Self-pay | Admitting: Medical Oncology

## 2018-04-13 DIAGNOSIS — Z5111 Encounter for antineoplastic chemotherapy: Secondary | ICD-10-CM | POA: Diagnosis not present

## 2018-04-13 DIAGNOSIS — R634 Abnormal weight loss: Secondary | ICD-10-CM | POA: Diagnosis not present

## 2018-04-13 DIAGNOSIS — D630 Anemia in neoplastic disease: Secondary | ICD-10-CM

## 2018-04-13 DIAGNOSIS — C7951 Secondary malignant neoplasm of bone: Secondary | ICD-10-CM | POA: Diagnosis not present

## 2018-04-13 DIAGNOSIS — R52 Pain, unspecified: Secondary | ICD-10-CM | POA: Diagnosis not present

## 2018-04-13 DIAGNOSIS — R5381 Other malaise: Secondary | ICD-10-CM

## 2018-04-13 DIAGNOSIS — G629 Polyneuropathy, unspecified: Secondary | ICD-10-CM | POA: Diagnosis not present

## 2018-04-13 DIAGNOSIS — C3481 Malignant neoplasm of overlapping sites of right bronchus and lung: Secondary | ICD-10-CM | POA: Diagnosis not present

## 2018-04-13 DIAGNOSIS — C3491 Malignant neoplasm of unspecified part of right bronchus or lung: Secondary | ICD-10-CM

## 2018-04-13 MED ORDER — HEPARIN SOD (PORK) LOCK FLUSH 100 UNIT/ML IV SOLN
250.0000 [IU] | INTRAVENOUS | Status: AC | PRN
  Administered 2018-04-13: 500 [IU]
  Filled 2018-04-13: qty 5

## 2018-04-13 MED ORDER — SODIUM CHLORIDE 0.9% FLUSH
10.0000 mL | INTRAVENOUS | Status: AC | PRN
  Administered 2018-04-13: 10 mL
  Filled 2018-04-13: qty 10

## 2018-04-13 MED ORDER — DIPHENHYDRAMINE HCL 25 MG PO CAPS
ORAL_CAPSULE | ORAL | Status: AC
Start: 2018-04-13 — End: ?
  Filled 2018-04-13: qty 1

## 2018-04-13 MED ORDER — ACETAMINOPHEN 325 MG PO TABS
650.0000 mg | ORAL_TABLET | Freq: Once | ORAL | Status: AC
  Administered 2018-04-13: 650 mg via ORAL

## 2018-04-13 MED ORDER — SODIUM CHLORIDE 0.9% IV SOLUTION
250.0000 mL | Freq: Once | INTRAVENOUS | Status: AC
  Administered 2018-04-13: 250 mL via INTRAVENOUS
  Filled 2018-04-13: qty 250

## 2018-04-13 MED ORDER — ACETAMINOPHEN 325 MG PO TABS
ORAL_TABLET | ORAL | Status: AC
  Filled 2018-04-13: qty 2

## 2018-04-13 MED ORDER — DIPHENHYDRAMINE HCL 25 MG PO CAPS
25.0000 mg | ORAL_CAPSULE | Freq: Once | ORAL | Status: AC
  Administered 2018-04-13: 25 mg via ORAL

## 2018-04-13 NOTE — Patient Instructions (Signed)

## 2018-04-14 ENCOUNTER — Telehealth: Payer: Self-pay | Admitting: Medical Oncology

## 2018-04-14 ENCOUNTER — Other Ambulatory Visit: Payer: Self-pay | Admitting: Medical Oncology

## 2018-04-14 ENCOUNTER — Other Ambulatory Visit: Payer: Self-pay | Admitting: Oncology

## 2018-04-14 DIAGNOSIS — C3491 Malignant neoplasm of unspecified part of right bronchus or lung: Secondary | ICD-10-CM

## 2018-04-14 LAB — BPAM RBC
Blood Product Expiration Date: 201908172359
Blood Product Expiration Date: 201908172359
ISSUE DATE / TIME: 201907241237
ISSUE DATE / TIME: 201907241237
UNIT TYPE AND RH: 5100
Unit Type and Rh: 5100

## 2018-04-14 LAB — TYPE AND SCREEN
ABO/RH(D): O POS
ANTIBODY SCREEN: NEGATIVE
Unit division: 0
Unit division: 0

## 2018-04-14 MED ORDER — OXYCODONE HCL 5 MG PO TABS: 5.0000 mg | ORAL_TABLET | Freq: Four times a day (QID) | ORAL | 0 refills | Status: DC | PRN

## 2018-04-14 MED FILL — oxyCODONE HCL 5 MG TABS: 5 | 15 days supply | Qty: 60 | Fill #0

## 2018-04-14 NOTE — Telephone Encounter (Signed)
Requests oxyir refill .

## 2018-04-20 ENCOUNTER — Inpatient Hospital Stay: Payer: Medicare Other

## 2018-04-20 ENCOUNTER — Inpatient Hospital Stay (HOSPITAL_BASED_OUTPATIENT_CLINIC_OR_DEPARTMENT_OTHER): Payer: Medicare Other | Admitting: Medical

## 2018-04-20 ENCOUNTER — Ambulatory Visit (HOSPITAL_COMMUNITY)
Admission: RE | Admit: 2018-04-20 | Discharge: 2018-04-20 | Disposition: A | Discharge status: Home / Self Care | Payer: Medicare Other | Source: Ambulatory Visit | Attending: Medical | Admitting: Medical

## 2018-04-20 ENCOUNTER — Other Ambulatory Visit: Payer: Self-pay | Admitting: Medical

## 2018-04-20 VITALS — BP 104/77 | HR 110 | Temp 98.0°F | Resp 18

## 2018-04-20 DIAGNOSIS — Z5111 Encounter for antineoplastic chemotherapy: Secondary | ICD-10-CM | POA: Diagnosis not present

## 2018-04-20 DIAGNOSIS — D696 Thrombocytopenia, unspecified: Secondary | ICD-10-CM | POA: Diagnosis not present

## 2018-04-20 DIAGNOSIS — C3491 Malignant neoplasm of unspecified part of right bronchus or lung: Secondary | ICD-10-CM | POA: Diagnosis not present

## 2018-04-20 DIAGNOSIS — C7951 Secondary malignant neoplasm of bone: Secondary | ICD-10-CM | POA: Diagnosis not present

## 2018-04-20 DIAGNOSIS — J90 Pleural effusion, not elsewhere classified: Secondary | ICD-10-CM | POA: Insufficient documentation

## 2018-04-20 DIAGNOSIS — E860 Dehydration: Secondary | ICD-10-CM

## 2018-04-20 DIAGNOSIS — C3481 Malignant neoplasm of overlapping sites of right bronchus and lung: Secondary | ICD-10-CM

## 2018-04-20 DIAGNOSIS — R52 Pain, unspecified: Secondary | ICD-10-CM | POA: Diagnosis not present

## 2018-04-20 DIAGNOSIS — R634 Abnormal weight loss: Secondary | ICD-10-CM | POA: Diagnosis not present

## 2018-04-20 DIAGNOSIS — D630 Anemia in neoplastic disease: Secondary | ICD-10-CM

## 2018-04-20 DIAGNOSIS — G629 Polyneuropathy, unspecified: Secondary | ICD-10-CM | POA: Diagnosis not present

## 2018-04-20 LAB — SAMPLE TO BLOOD BANK

## 2018-04-20 LAB — CMP (CANCER CENTER ONLY)
ALK PHOS: 174 U/L — AB (ref 38–126)
ALT: 28 U/L (ref 0–44)
AST: 37 U/L (ref 15–41)
Albumin: 2.9 g/dL — ABNORMAL LOW (ref 3.5–5.0)
Anion gap: 10 (ref 5–15)
BILIRUBIN TOTAL: 0.6 mg/dL (ref 0.3–1.2)
BUN: 6 mg/dL (ref 6–20)
CALCIUM: 9.8 mg/dL (ref 8.9–10.3)
CO2: 26 mmol/L (ref 22–32)
CREATININE: 0.67 mg/dL (ref 0.44–1.00)
Chloride: 102 mmol/L (ref 98–111)
Glucose, Bld: 104 mg/dL — ABNORMAL HIGH (ref 70–99)
Potassium: 3.6 mmol/L (ref 3.5–5.1)
Sodium: 138 mmol/L (ref 135–145)
TOTAL PROTEIN: 7.9 g/dL (ref 6.5–8.1)

## 2018-04-20 LAB — CBC WITH DIFFERENTIAL (CANCER CENTER ONLY)
Basophils Absolute: 0 K/uL (ref 0.0–0.1)
Basophils Relative: 0 %
Eosinophils Absolute: 0 K/uL (ref 0.0–0.5)
Eosinophils Relative: 0 %
HCT: 31.4 % — ABNORMAL LOW (ref 34.8–46.6)
Hemoglobin: 10.3 g/dL — ABNORMAL LOW (ref 11.6–15.9)
Lymphocytes Relative: 15 %
Lymphs Abs: 0.5 K/uL — ABNORMAL LOW (ref 0.9–3.3)
MCH: 30.6 pg (ref 25.1–34.0)
MCHC: 32.8 g/dL (ref 31.5–36.0)
MCV: 93.2 fL (ref 79.5–101.0)
Monocytes Absolute: 0.6 K/uL (ref 0.1–0.9)
Monocytes Relative: 19 %
Neutro Abs: 2 K/uL (ref 1.5–6.5)
Neutrophils Relative %: 66 %
Platelet Count: 82 K/uL — ABNORMAL LOW (ref 145–400)
RBC: 3.37 MIL/uL — ABNORMAL LOW (ref 3.70–5.45)
RDW: 18.9 % — ABNORMAL HIGH (ref 11.2–14.5)
WBC Count: 3.1 K/uL — ABNORMAL LOW (ref 3.9–10.3)

## 2018-04-20 MED ORDER — SODIUM CHLORIDE 0.9% FLUSH
10.0000 mL | INTRAVENOUS | Status: DC | PRN
  Administered 2018-04-20: 10 mL
  Filled 2018-04-20: qty 10

## 2018-04-20 MED ORDER — PROCHLORPERAZINE MALEATE 10 MG PO TABS
ORAL_TABLET | ORAL | Status: AC
  Filled 2018-04-20: qty 1

## 2018-04-20 MED ORDER — HEPARIN SOD (PORK) LOCK FLUSH 100 UNIT/ML IV SOLN
500.0000 [IU] | Freq: Once | INTRAVENOUS | Status: AC | PRN
  Administered 2018-04-20: 500 [IU]
  Filled 2018-04-20: qty 5

## 2018-04-20 NOTE — Patient Instructions (Signed)
Thrombocytopenia Thrombocytopenia means that you have a low number of platelets in your blood. Platelets are tiny cells in the blood. When you bleed, they clump together at the cut or injury to stop the bleeding. This is called blood clotting. Not having enough platelets can cause bleeding problems. Follow these instructions at home: General instructions  Check your skin and inside your mouth for bruises or blood as told by your doctor.  Check to see if there is blood in your spit (sputum), pee (urine), and poop (stool). Do this as told by your doctor.  Ask your doctor if you can drink alcohol.  Take over-the-counter and prescription medicines only as told by your doctor.  Tell all of your doctors that you have this condition. Be sure to tell your dentist and eye doctor too. Activity  Do not do activities that can cause bumps or bruises until your doctor says it is okay.  Be careful not to cut yourself: ? When you shave. ? When you use scissors, needles, knives, or other tools.  Be careful not to burn yourself: ? When you use an iron. ? When you cook. Contact a doctor if:  You have bruises and you do not know why. Get help right away if:  You are bleeding anywhere on your body.  You have blood in your spit, pee, or poop. This information is not intended to replace advice given to you by your health care provider. Make sure you discuss any questions you have with your health care provider. Document Released: 08/27/2011 Document Revised: 05/10/2016 Document Reviewed: 03/11/2015 Elsevier Interactive Patient Education  [employer].

## 2018-04-20 NOTE — Progress Notes (Signed)
Per Sandi Mealy, PA-C hold treatment today d/t low platelet count. Patient aware and agrees. Patient taken to radiology dept via wheelchair.

## 2018-04-20 NOTE — Progress Notes (Signed)
These results were called to Doris Cheadle. A message was left for her. she was told to call if there were questions.  Sandi Mealy, MHS, PA-C

## 2018-04-22 NOTE — Progress Notes (Signed)
[facility] Progress Note   Theresa [name] [id number] [birth year] 57 y.o.  Theresa [name] is managed by Dr. Fanny Bien. Mohamed  Actively treated with chemotherapy/immunotherapy: yes  Current Therapy: Gemcitabine  Last Treated: 04/12/2018 (cycle 5, day 1)  Assessment: Plan:    Metastatic primary lung cancer, right ([place])  Recurrent right pleural effusion  Thrombocytopenia ([place])   Metastatic primary lung cancer: Theresa [name] presents to the office today for cycle 5, day 8 of gemcitabine.  Her platelet count returned at 82,000.  The patient's treatment was held based on a review of her prior treatments and her platelet count.  Her last treatment that was held was held due to a platelet count of 79,000.  History of a recurrent right pleural effusion: This provider was asked to see Theresa [name] while she was in infusion after she reported having progressive shortness of breath with dyspnea on exertion and was questioning whether she may need a therapeutic thoracentesis.  She was referred for a chest x-ray which returned showing only a small recurrent right pleural effusion.  No thoracentesis was ordered.  Thrombocytopenia: His CBC returned today with a platelet count of 82,000.  Her labs will be rechecked on her return on 05/05/2018.  Please see After Visit Summary for patient specific instructions.  Future Appointments  Date Time Provider [place]  05/05/2018  9:15 AM [facility] LAB 3 [facility] None  05/05/2018  9:30 AM [facility] [place] FLUSH [facility] None  05/05/2018 10:00 AM Curt Bears, MD [facility] None  05/05/2018 11:00 AM [facility] INFUSION [facility] None  05/11/2018  1:00 PM [facility] LAB 2 [facility] None  05/11/2018  1:15 PM [facility] [place] FLUSH [facility] None  05/11/2018  2:15 PM [facility] INFUSION [facility] None  05/25/2018  1:00 PM [facility] LAB 4 [facility] None  05/25/2018  1:15 PM [facility] [place] FLUSH [facility] None    05/25/2018  2:00 PM Curcio, Kristin R, NP [facility] None  05/25/2018  3:00 PM [facility] INFUSION [facility] None  05/31/2018  1:45 PM [facility] LAB 6 [facility] None  05/31/2018  2:00 PM [facility] [place] FLUSH [facility] None  05/31/2018  3:00 PM [facility] INFUSION [facility] None    No orders of the defined types were placed in this encounter.      Subjective:   Patient ID:  Theresa [name] is a 57 y.o. (DOB Sep 06, 1961) female.  Chief Complaint: No chief complaint on file.   HPI Theresa [name]  is a 57 year old female with a history of a metastatic non-small cell lung cancer who is managed by Dr. Fanny Bien. Mohamed.  She was seen in infusion today for consideration of cycle 5, day 8 of gemcitabine.  This provider was asked to see Theresa [name] while she was in infusion.  She has a history of a recurrent right pleural effusion and has had a repeat therapeutic thoracentesis.she reports that she is having recurrent dyspnea on exertion and requests consideration of a right thoracentesis.    This provider was also asked by infusion as to if the patient should receive her therapy today given that her CBC returned with a platelet count of 82,000.  After review of her prior treatments and platelet counts it was decided that today's treatment would be held.  Medications: I have reviewed the patient's current medications.  Allergies: No Known Allergies  Past Medical History:  Diagnosis Date  . Cancer ([place])    5 years ago - cervical cancer  . Encounter for antineoplastic chemotherapy 08/27/2015  .  History of radiation therapy 08/19/2015 - 10/07/2015   Site/dose:   The patient's primary tumor and involved lymph nodes were treated to 66 Gy in 30 fractions.  . Hypokalemia 09/30/2015  . Non-small cell carcinoma of lung, stage 3 ([place]) 08/08/2015  . Paroxysmal atrial flutter ([place]) 09/16/2015  . Pneumonia     Past Surgical History:  Procedure Laterality Date  . IR FLUORO GUIDE PORT  INSERTION RIGHT  05/21/2017  . IR US GUIDE VASC ACCESS RIGHT  05/21/2017  . TUBAL LIGATION    . VIDEO BRONCHOSCOPY WITH ENDOBRONCHIAL ULTRASOUND N/A 08/02/2015   Procedure: VIDEO BRONCHOSCOPY WITH ENDOBRONCHIAL ULTRASOUND;  Surgeon: [name], MD;  Location: [facility] OR;  Service: Thoracic;  Laterality: N/A;  . WISDOM TOOTH EXTRACTION      Family History  Problem Relation Age of Onset  . COPD Mother   . Diabetes type II Mother   . Heart disease Mother   . High Cholesterol Mother   . Lung cancer Father     Social History   Socioeconomic History  . Marital status: Single    Spouse name: Not on file  . Number of children: Not on file  . Years of education: Not on file  . Highest education level: Not on file  Occupational History  . Not on file  Social Needs  . Financial resource strain: Not on file  . Food insecurity:    Worry: Not on file    Inability: Not on file  . Transportation needs:    Medical: Not on file    Non-medical: Not on file  Tobacco Use  . Smoking status: Former Smoker    Packs/day: 0.30    Years: 5.00    Pack years: 1.50    Types: Cigarettes    Last attempt to quit: 07/31/1990    Years since quitting: 27.7  . Smokeless tobacco: Never Used  Substance and Sexual Activity  . Alcohol use: No    Alcohol/week: 0.0 oz  . Drug use: No  . Sexual activity: Not Currently  Lifestyle  . Physical activity:    Days per week: Not on file    Minutes per session: Not on file  . Stress: Not on file  Relationships  . Social connections:    Talks on phone: Not on file    Gets together: Not on file    Attends religious service: Not on file    Active member of club or organization: Not on file    Attends meetings of clubs or organizations: Not on file    Relationship status: Not on file  . Intimate partner violence:    Fear of current or ex partner: Not on file    Emotionally abused: Not on file    Physically abused: Not on file    Forced sexual activity:  Not on file  Other Topics Concern  . Not on file  Social History Narrative  . Not on file    Past Medical History, Surgical history, Social history, and Family history were reviewed and updated as appropriate.   Please see review of systems for further details on the patient's review from today.   Review of Systems:  Review of Systems  Constitutional: Negative for chills, diaphoresis and fever.  HENT: Negative for trouble swallowing.   Respiratory: Positive for shortness of breath. Negative for cough, choking, chest tightness, wheezing and stridor.   Cardiovascular: Negative for chest pain and palpitations.    Objective:   Physical Exam:  LMP  03/21/2008  ECOG: 1  Physical Exam  Constitutional: No distress.  HENT:  Head: Normocephalic and atraumatic.  Cardiovascular: Normal rate, regular rhythm and normal heart sounds. Exam reveals no gallop and no friction rub.  No murmur heard. Pulmonary/Chest: Effort normal. No stridor. No respiratory distress. She has no wheezes. She has no rales.  Decreased auscultation and percussion over the right lung base.  Lungs are otherwise clear to auscultation without wheezes rales or rhonchi.  Neurological: She is alert.  Skin: Skin is warm and dry. She is not diaphoretic. No erythema. No pallor.  Psychiatric: She has a normal mood and affect. Her behavior is normal. Judgment and thought content normal.    Lab Review:     Component Value Date/Time   NA 138 04/20/2018 1324   NA 138 09/24/2017 1600   K 3.6 04/20/2018 1324   K 3.4 (L) 09/24/2017 1600   CL 102 04/20/2018 1324   CO2 26 04/20/2018 1324   CO2 25 09/24/2017 1600   GLUCOSE 104 (H) 04/20/2018 1324   GLUCOSE 125 09/24/2017 1600   BUN 6 04/20/2018 1324   BUN 9.5 09/24/2017 1600   CREATININE 0.67 04/20/2018 1324   CREATININE 0.8 09/24/2017 1600   CALCIUM 9.8 04/20/2018 1324   CALCIUM 9.0 09/24/2017 1600   PROT 7.9 04/20/2018 1324   PROT 7.4 09/24/2017 1600   ALBUMIN 2.9 (L)  04/20/2018 1324   ALBUMIN 3.5 09/24/2017 1600   AST 37 04/20/2018 1324   AST 31 09/24/2017 1600   ALT 28 04/20/2018 1324   ALT 24 09/24/2017 1600   ALKPHOS 174 (H) 04/20/2018 1324   ALKPHOS 92 09/24/2017 1600   BILITOT 0.6 04/20/2018 1324   BILITOT 0.40 09/24/2017 1600   GFRNONAA >60 04/20/2018 1324   GFRAA >60 04/20/2018 1324       Component Value Date/Time   WBC 3.1 (L) 04/20/2018 1324   WBC 4.9 01/04/2018 1115   RBC 3.37 (L) 04/20/2018 1324   HGB 10.3 (L) 04/20/2018 1324   HGB 8.3 (L) 09/24/2017 1601   HCT 31.4 (L) 04/20/2018 1324   HCT 24.5 (L) 09/24/2017 1601   PLT 82 (L) 04/20/2018 1324   PLT 92 (L) 09/24/2017 1601   PLT 300 02/11/2017 1632   MCV 93.2 04/20/2018 1324   MCV 90.7 09/24/2017 1601   MCH 30.6 04/20/2018 1324   MCHC 32.8 04/20/2018 1324   RDW 18.9 (H) 04/20/2018 1324   RDW 18.7 (H) 09/24/2017 1601   LYMPHSABS 0.5 (L) 04/20/2018 1324   LYMPHSABS 0.8 (L) 09/24/2017 1601   MONOABS 0.6 04/20/2018 1324   MONOABS 0.2 09/24/2017 1601   EOSABS 0.0 04/20/2018 1324   EOSABS 0.0 09/24/2017 1601   BASOSABS 0.0 04/20/2018 1324   BASOSABS 0.0 09/24/2017 1601   -------------------------------  Imaging from last 24 hours (if applicable):  Radiology interpretation: Dg Chest Right Decubitus  Result Date: 04/20/2018 CLINICAL DATA:  Metastatic lung cancer with recurrent right pleural effusion. EXAM: CHEST - RIGHT DECUBITUS COMPARISON:  CT of the chest 03/21/2018 FINDINGS: Small loculated right pleural effusion. No evidence of pneumothorax. Right-sided injectable port terminates at the level of the cavoatrial junction. Left lung appears clear. Normal cardiac silhouette. IMPRESSION: Small loculated right pleural effusion. Electronically Signed   By: Fidela Salisbury M.D.   On: 04/20/2018 16:01

## 2018-04-29 ENCOUNTER — Other Ambulatory Visit: Payer: Self-pay | Admitting: Internal Medicine

## 2018-04-29 DIAGNOSIS — C3491 Malignant neoplasm of unspecified part of right bronchus or lung: Secondary | ICD-10-CM

## 2018-04-29 MED ORDER — OXYCODONE HCL 5 MG PO TABS: 5.0000 mg | ORAL_TABLET | Freq: Four times a day (QID) | ORAL | 0 refills | Status: DC | PRN

## 2018-04-29 MED FILL — oxyCODONE HCL 5 MG TABS: 5 | 15 days supply | Qty: 60 | Fill #0

## 2018-05-05 ENCOUNTER — Inpatient Hospital Stay: Payer: Medicare Other

## 2018-05-05 ENCOUNTER — Encounter: Payer: Self-pay | Admitting: Internal Medicine

## 2018-05-05 ENCOUNTER — Inpatient Hospital Stay: Payer: Medicare Other | Attending: Internal Medicine

## 2018-05-05 ENCOUNTER — Telehealth: Payer: Self-pay | Admitting: Internal Medicine

## 2018-05-05 ENCOUNTER — Inpatient Hospital Stay (HOSPITAL_BASED_OUTPATIENT_CLINIC_OR_DEPARTMENT_OTHER): Payer: Medicare Other | Admitting: Internal Medicine

## 2018-05-05 VITALS — BP 109/74 | HR 126 | Temp 98.4°F | Resp 18 | Ht 68.0 in | Wt 170.4 lb

## 2018-05-05 DIAGNOSIS — C3481 Malignant neoplasm of overlapping sites of right bronchus and lung: Secondary | ICD-10-CM | POA: Diagnosis not present

## 2018-05-05 DIAGNOSIS — C7989 Secondary malignant neoplasm of other specified sites: Secondary | ICD-10-CM | POA: Diagnosis not present

## 2018-05-05 DIAGNOSIS — Z5111 Encounter for antineoplastic chemotherapy: Secondary | ICD-10-CM | POA: Insufficient documentation

## 2018-05-05 DIAGNOSIS — G893 Neoplasm related pain (acute) (chronic): Secondary | ICD-10-CM | POA: Diagnosis not present

## 2018-05-05 DIAGNOSIS — E860 Dehydration: Secondary | ICD-10-CM | POA: Insufficient documentation

## 2018-05-05 DIAGNOSIS — C3491 Malignant neoplasm of unspecified part of right bronchus or lung: Secondary | ICD-10-CM

## 2018-05-05 DIAGNOSIS — D630 Anemia in neoplastic disease: Secondary | ICD-10-CM

## 2018-05-05 DIAGNOSIS — R112 Nausea with vomiting, unspecified: Secondary | ICD-10-CM | POA: Insufficient documentation

## 2018-05-05 DIAGNOSIS — C342 Malignant neoplasm of middle lobe, bronchus or lung: Secondary | ICD-10-CM

## 2018-05-05 DIAGNOSIS — C3490 Malignant neoplasm of unspecified part of unspecified bronchus or lung: Secondary | ICD-10-CM

## 2018-05-05 LAB — CBC WITH DIFFERENTIAL (CANCER CENTER ONLY)
BASOS PCT: 0 %
Basophils Absolute: 0 10*3/uL (ref 0.0–0.1)
Eosinophils Absolute: 0 10*3/uL (ref 0.0–0.5)
Eosinophils Relative: 0 %
HEMATOCRIT: 32.2 % — AB (ref 34.8–46.6)
Hemoglobin: 10.5 g/dL — ABNORMAL LOW (ref 11.6–15.9)
Lymphocytes Relative: 13 %
Lymphs Abs: 1.2 10*3/uL (ref 0.9–3.3)
MCH: 30.6 pg (ref 25.1–34.0)
MCHC: 32.6 g/dL (ref 31.5–36.0)
MCV: 93.9 fL (ref 79.5–101.0)
MONOS PCT: 14 %
Monocytes Absolute: 1.3 10*3/uL — ABNORMAL HIGH (ref 0.1–0.9)
NEUTROS ABS: 6.6 10*3/uL — AB (ref 1.5–6.5)
Neutrophils Relative %: 73 %
Platelet Count: 295 10*3/uL (ref 145–400)
RBC: 3.43 MIL/uL — ABNORMAL LOW (ref 3.70–5.45)
RDW: 19.5 % — AB (ref 11.2–14.5)
WBC Count: 9.1 10*3/uL (ref 3.9–10.3)

## 2018-05-05 LAB — CMP (CANCER CENTER ONLY)
ALBUMIN: 3 g/dL — AB (ref 3.5–5.0)
ALK PHOS: 125 U/L (ref 38–126)
ALT: 16 U/L (ref 0–44)
AST: 42 U/L — ABNORMAL HIGH (ref 15–41)
Anion gap: 12 (ref 5–15)
BUN: 7 mg/dL (ref 6–20)
CALCIUM: 9.7 mg/dL (ref 8.9–10.3)
CO2: 26 mmol/L (ref 22–32)
CREATININE: 0.69 mg/dL (ref 0.44–1.00)
Chloride: 100 mmol/L (ref 98–111)
GFR, Est AFR Am: >60 mL/min (ref >60)
GFR, Estimated: >60 mL/min (ref >60)
GLUCOSE: 127 mg/dL — AB (ref 70–99)
Potassium: 3.2 mmol/L — ABNORMAL LOW (ref 3.5–5.1)
SODIUM: 138 mmol/L (ref 135–145)
Total Bilirubin: 0.8 mg/dL (ref 0.3–1.2)
Total Protein: 8.6 g/dL — ABNORMAL HIGH (ref 6.5–8.1)

## 2018-05-05 MED ORDER — PROCHLORPERAZINE MALEATE 10 MG PO TABS
10.0000 mg | ORAL_TABLET | Freq: Once | ORAL | Status: AC
  Administered 2018-05-05: 10 mg via ORAL

## 2018-05-05 MED ORDER — PROCHLORPERAZINE MALEATE 10 MG PO TABS
ORAL_TABLET | ORAL | Status: AC
  Filled 2018-05-05: qty 1

## 2018-05-05 MED ORDER — HEPARIN SOD (PORK) LOCK FLUSH 100 UNIT/ML IV SOLN
500.0000 [IU] | Freq: Once | INTRAVENOUS | Status: AC | PRN
  Administered 2018-05-05: 500 [IU]
  Filled 2018-05-05: qty 5

## 2018-05-05 MED ORDER — SODIUM CHLORIDE 0.9% FLUSH
10.0000 mL | INTRAVENOUS | Status: DC | PRN
  Administered 2018-05-05: 10 mL
  Filled 2018-05-05: qty 10

## 2018-05-05 MED ORDER — SODIUM CHLORIDE 0.9 % IV SOLN
2000.0000 mg | Freq: Once | INTRAVENOUS | Status: AC
  Administered 2018-05-05: 2000 mg via INTRAVENOUS
  Filled 2018-05-05: qty 52.6

## 2018-05-05 MED ORDER — SODIUM CHLORIDE 0.9 % IV SOLN
Freq: Once | INTRAVENOUS | Status: AC
  Administered 2018-05-05: 11:00:00 via INTRAVENOUS
  Filled 2018-05-05: qty 250

## 2018-05-05 NOTE — Telephone Encounter (Signed)
Scheduled appt per 8/15 los - pt to get an updated schedule next visit.

## 2018-05-05 NOTE — Progress Notes (Signed)
[place] [phone]   [phone]  OFFICE PROGRESS NOTE  [name], PA-C [place] [id number]  DIAGNOSIS:Metastatic non-small cell lung cancer initially diagnosed as Stage IIIA (T2b, N2, M0) non-small cell lung cancer, invasive poorly differentiated squamous cell carcinoma diagnosed in November 2016 and presented with large right hilar mass with collapse of the right middle lobe and extension into the right upper lobe and right lower lobe along the hilum with right paratracheal and subcarinal lymphadenopathy. She presented in July 2018 with metastatic intramuscular mass in the right posterior lateral chest wall, with biopsy confirmed squamous cell carcinoma. PDL1 Expression: 50%.  PRIOR THERAPY: 1) Concurrent chemoradiation with weekly carboplatin for AUC of 2 and paclitaxel 45 MG/M2. First dose 08/19/2015. Status post 5 cycles. Last cycle was given 10/07/2015 with significant response. 2) Consolidation chemotherapy with carboplatin for AUC of 5 and paclitaxel 175 MG/M2 every 3 weeks with Neulasta support. First dose 11/18/2015. Status post 3 cycles. 3) palliative radiotherapy to the intramuscular mass in the right posterior lateral chest wall under the care of Dr. Tammi Klippel. 4) Ketruda 200 mg IV every 3 weeks. First dose 05/12/2017. Status post 3 cycles. This was discontinued secondary to disease progression. 5)Systemic chemotherapy with carboplatin for AUC of 5 and paclitaxel 175 mg/M2 every 3 weeks with Neulasta support. First dose August 26, 2017. Status post6cycles.  CURRENT THERAPY:Gemzar 1000 mg/m day 1 and day 8 of 21-day cycle. First dose 01/12/2018.Status post 5 cycles.   INTERVAL HISTORY: [name] 57 y.o. female returns to the clinic today for follow-up visit accompanied by her son.  The patient is feeling fine today with no specific complaints except for the generalized fatigue and pain on the right  back.  She denied having any chest pain, shortness of breath, cough or hemoptysis.  She denied having any nausea, vomiting, diarrhea or constipation.  She lost few pounds since her last visit but she eats good.  She is here today for evaluation before starting cycle #6 of her treatment.  MEDICAL HISTORY: Past Medical History:  Diagnosis Date  . Cancer ([place])    5 years ago - cervical cancer  . Encounter for antineoplastic chemotherapy 08/27/2015  . History of radiation therapy 08/19/2015 - 10/07/2015   Site/dose:   The patient's primary tumor and involved lymph nodes were treated to 66 Gy in 30 fractions.  . Hypokalemia 09/30/2015  . Non-small cell carcinoma of lung, stage 3 ([place]) 08/08/2015  . Paroxysmal atrial flutter ([place]) 09/16/2015  . Pneumonia     ALLERGIES:  has No Known Allergies.  MEDICATIONS:  Current Outpatient Medications  Medication Sig Dispense Refill  . gabapentin (NEURONTIN) 300 MG capsule Take 1 capsule in the morning, 1 capsule in the afternoon, and 2 capsules at bedtime. 120 capsule 0  . ibuprofen (ADVIL,MOTRIN) 800 MG tablet Take 1 tablet by mouth every 8 (eight) hours as needed.    . lidocaine (LIDODERM) 5 % Place 1 patch onto the skin daily. Remove & Discard patch within 12 hours or as directed by MD 30 patch 0  . lidocaine-prilocaine (EMLA) cream Apply 1 application topically as needed. 30 g 2  . LORazepam (ATIVAN) 0.5 MG tablet 0.5 mg sublingual p.o. every 6 hours as needed for nausea and/or vomiting. 40 tablet 0  . metoprolol tartrate (LOPRESSOR) 25 MG tablet Take 0.5 tablets (12.5 mg total) by mouth 2 (two) times daily. 90 tablet 1  . mirtazapine (REMERON) 30 MG tablet  Take 1 tablet (30 mg total) by mouth at bedtime. 30 tablet 1  . Multiple Vitamin (MULTIVITAMIN) capsule Take 1 capsule daily by mouth. 30 capsule 2  . ondansetron (ZOFRAN) 8 MG tablet Take 1 tablet (8 mg total) by mouth every 8 (eight) hours as needed for nausea or vomiting. 20 tablet 3  . oxyCODONE  (OXY IR/ROXICODONE) 5 MG immediate release tablet Take 1 tablet (5 mg total) by mouth every 6 (six) hours as needed for severe pain. 60 tablet 0  . potassium chloride SA (K-DUR,KLOR-CON) 20 MEQ tablet Take 2 tabs every morning and 1 tab every evening. 90 tablet 0  . prochlorperazine (COMPAZINE) 10 MG tablet Take 1 tablet (10 mg total) by mouth every 6 (six) hours as needed for nausea or vomiting. 30 tablet 1   No current facility-administered medications for this visit.    Facility-Administered Medications Ordered in Other Visits  Medication Dose Route Frequency Provider Last Rate Last Dose  . 0.9 %  sodium chloride infusion   Intravenous Continuous Curcio, Kristin R, NP      . 0.9 %  sodium chloride infusion   Intravenous Continuous Curt Bears, MD   Stopped at 05/31/17 1714  . ondansetron (ZOFRAN) injection 8 mg  8 mg Intravenous Once Curt Bears, MD        SURGICAL HISTORY:  Past Surgical History:  Procedure Laterality Date  . IR FLUORO GUIDE PORT INSERTION RIGHT  05/21/2017  . IR US GUIDE VASC ACCESS RIGHT  05/21/2017  . TUBAL LIGATION    . VIDEO BRONCHOSCOPY WITH ENDOBRONCHIAL ULTRASOUND N/A 08/02/2015   Procedure: VIDEO BRONCHOSCOPY WITH ENDOBRONCHIAL ULTRASOUND;  Surgeon: [name], MD;  Location: [place];  Service: Thoracic;  Laterality: N/A;  . WISDOM TOOTH EXTRACTION      REVIEW OF SYSTEMS:  A comprehensive review of systems was negative except for: Constitutional: positive for fatigue and weight loss Musculoskeletal: positive for back pain   PHYSICAL EXAMINATION: General appearance: alert, cooperative, fatigued and no distress Head: Normocephalic, without obvious abnormality, atraumatic Neck: no adenopathy, no JVD, supple, symmetrical, trachea midline and thyroid not enlarged, symmetric, no tenderness/mass/nodules Lymph nodes: Cervical, supraclavicular, and axillary nodes normal. Resp: clear to auscultation bilaterally Back: symmetric, no curvature. ROM  normal. No CVA tenderness. Cardio: regular rate and rhythm, S1, S2 normal, no murmur, click, rub or gallop GI: soft, non-tender; bowel sounds normal; no masses,  no organomegaly Extremities: extremities normal, atraumatic, no cyanosis or edema  ECOG PERFORMANCE STATUS: 1 - Symptomatic but completely ambulatory  Blood pressure 109/74, pulse (!) 126, temperature 98.4 F (36.9 C), temperature source Oral, resp. rate 18, height '5\' 8"'  (1.727 m), weight 170 lb 6.4 oz (77.3 kg), last menstrual period 03/21/2008, SpO2 98 %.  LABORATORY DATA: Lab Results  Component Value Date   WBC 9.1 05/05/2018   HGB 10.5 (L) 05/05/2018   HCT 32.2 (L) 05/05/2018   MCV 93.9 05/05/2018   PLT 295 05/05/2018      Chemistry      Component Value Date/Time   NA 138 05/05/2018 0830   NA 138 09/24/2017 1600   K 3.2 (L) 05/05/2018 0830   K 3.4 (L) 09/24/2017 1600   CL 100 05/05/2018 0830   CO2 26 05/05/2018 0830   CO2 25 09/24/2017 1600   BUN 7 05/05/2018 0830   BUN 9.5 09/24/2017 1600   CREATININE 0.69 05/05/2018 0830   CREATININE 0.8 09/24/2017 1600      Component Value Date/Time   CALCIUM 9.7 05/05/2018  0830   CALCIUM 9.0 09/24/2017 1600   ALKPHOS 125 05/05/2018 0830   ALKPHOS 92 09/24/2017 1600   AST 42 (H) 05/05/2018 0830   AST 31 09/24/2017 1600   ALT 16 05/05/2018 0830   ALT 24 09/24/2017 1600   BILITOT 0.8 05/05/2018 0830   BILITOT 0.40 09/24/2017 1600       RADIOGRAPHIC STUDIES: Dg Chest Right Decubitus  Result Date: 04/20/2018 CLINICAL DATA:  Metastatic lung cancer with recurrent right pleural effusion. EXAM: CHEST - RIGHT DECUBITUS COMPARISON:  CT of the chest 03/21/2018 FINDINGS: Small loculated right pleural effusion. No evidence of pneumothorax. Right-sided injectable port terminates at the level of the cavoatrial junction. Left lung appears clear. Normal cardiac silhouette. IMPRESSION: Small loculated right pleural effusion. Electronically Signed   By: Fidela Salisbury M.D.   On:  04/20/2018 16:01    ASSESSMENT AND PLAN:  This is a very pleasant 57 years old African-American female with metastatic non-small cell lung cancer that was initially diagnosed as stage IIIA non-small cell lung cancer, squamous cell carcinoma presented with large right hilar mass and mediastinal lymphadenopathy diagnosed in November 2016. She status post concurrent chemoradiation followed by consolidation chemotherapy. The patient was found recently to have intramuscular metastases and the right posterior chest wall that was biopsy-proven to be squamous cell carcinoma with PDL 1 expression 50%. The patient was a started on treatment with immunotherapy with Ketruda (pembrolizumab) status post 3 cycles. Unfortunately imaging studies after the treatment with Keytruda showed evidence for disease progression. The patient is currently undergoing treatment with carboplatin for AUC of 5 and paclitaxel 175 mg/M2 status post 6 cycles.  This was discontinued secondary to disease progression. The patient was started on systemic chemotherapy with single agent gemcitabine 1000 mg/M2 on days 1 and 8 every 3 weeks status post 5 cycles and has been tolerating this treatment well. I recommended for the patient to proceed with cycle #6 today as scheduled. I will see her back for follow-up visit in 3 weeks for evaluation after repeating CT scan of the chest, abdomen and pelvis for restaging of her disease. For pain management she will continue with her current pain medications with OxyIR as well as gabapentin. The patient was advised to call immediately if she has any concerning symptoms in the interval. The patient voices understanding of current disease status and treatment options and is in agreement with the current care plan.  All questions were answered. The patient knows to call the clinic with any problems, questions or concerns. We can certainly see the patient much sooner if necessary.  I spent 10 minutes  counseling the patient face to face. The total time spent in the appointment was 15 minutes.  Disclaimer: This note was dictated with voice recognition software. Similar sounding words can inadvertently be transcribed and may not be corrected upon review.

## 2018-05-05 NOTE — Patient Instructions (Signed)
[facility] °Discharge Instructions for Patients Receiving Chemotherapy ° °Today you received the following chemotherapy agents Gemzar ° °To help prevent nausea and vomiting after your treatment, we encourage you to take your nausea medication as directed. °  °If you develop nausea and vomiting that is not controlled by your nausea medication, call the clinic.  ° °BELOW ARE SYMPTOMS THAT SHOULD BE REPORTED IMMEDIATELY: °· *FEVER GREATER THAN 100.5 F °· *CHILLS WITH OR WITHOUT FEVER °· NAUSEA AND VOMITING THAT IS NOT CONTROLLED WITH YOUR NAUSEA MEDICATION °· *UNUSUAL SHORTNESS OF BREATH °· *UNUSUAL BRUISING OR BLEEDING °· TENDERNESS IN MOUTH AND THROAT WITH OR WITHOUT PRESENCE OF ULCERS °· *URINARY PROBLEMS °· *BOWEL PROBLEMS °· UNUSUAL RASH °Items with * indicate a potential emergency and should be followed up as soon as possible. ° °Feel free to call the clinic should you have any questions or concerns. The clinic phone number is [phone]. ° °Please show the CHEMO ALERT CARD at check-in to the Emergency Department and triage nurse. ° ° °

## 2018-05-05 NOTE — Progress Notes (Signed)
Per Dr. Julien Nordmann ok to treat with elevated heart rate of 126.

## 2018-05-09 ENCOUNTER — Telehealth: Payer: Self-pay | Admitting: Medical Oncology

## 2018-05-09 NOTE — Telephone Encounter (Signed)
Spoke with Santiago Glad re PT evaluate and treat. She will look into referral.

## 2018-05-10 DIAGNOSIS — I482 Chronic atrial fibrillation: Secondary | ICD-10-CM | POA: Diagnosis not present

## 2018-05-10 DIAGNOSIS — Z8701 Personal history of pneumonia (recurrent): Secondary | ICD-10-CM | POA: Diagnosis not present

## 2018-05-10 DIAGNOSIS — D649 Anemia, unspecified: Secondary | ICD-10-CM | POA: Diagnosis not present

## 2018-05-10 DIAGNOSIS — C3490 Malignant neoplasm of unspecified part of unspecified bronchus or lung: Secondary | ICD-10-CM | POA: Diagnosis not present

## 2018-05-10 DIAGNOSIS — G629 Polyneuropathy, unspecified: Secondary | ICD-10-CM | POA: Diagnosis not present

## 2018-05-11 ENCOUNTER — Other Ambulatory Visit: Payer: Self-pay | Admitting: Medical Oncology

## 2018-05-11 ENCOUNTER — Inpatient Hospital Stay: Payer: Medicare Other

## 2018-05-11 VITALS — BP 101/80 | HR 92 | Temp 98.1°F | Resp 18

## 2018-05-11 DIAGNOSIS — E876 Hypokalemia: Secondary | ICD-10-CM

## 2018-05-11 DIAGNOSIS — C3491 Malignant neoplasm of unspecified part of right bronchus or lung: Secondary | ICD-10-CM

## 2018-05-11 DIAGNOSIS — R112 Nausea with vomiting, unspecified: Secondary | ICD-10-CM | POA: Diagnosis not present

## 2018-05-11 DIAGNOSIS — Z5111 Encounter for antineoplastic chemotherapy: Secondary | ICD-10-CM | POA: Diagnosis not present

## 2018-05-11 DIAGNOSIS — C7989 Secondary malignant neoplasm of other specified sites: Secondary | ICD-10-CM | POA: Diagnosis not present

## 2018-05-11 DIAGNOSIS — G893 Neoplasm related pain (acute) (chronic): Secondary | ICD-10-CM | POA: Diagnosis not present

## 2018-05-11 DIAGNOSIS — C3481 Malignant neoplasm of overlapping sites of right bronchus and lung: Secondary | ICD-10-CM | POA: Diagnosis not present

## 2018-05-11 DIAGNOSIS — E860 Dehydration: Secondary | ICD-10-CM | POA: Diagnosis not present

## 2018-05-11 LAB — CMP (CANCER CENTER ONLY)
ALT: 32 U/L (ref 0–44)
ANION GAP: 11 (ref 5–15)
AST: 48 U/L — ABNORMAL HIGH (ref 15–41)
Albumin: 2.7 g/dL — ABNORMAL LOW (ref 3.5–5.0)
Alkaline Phosphatase: 142 U/L — ABNORMAL HIGH (ref 38–126)
BUN: 5 mg/dL — ABNORMAL LOW (ref 6–20)
CHLORIDE: 99 mmol/L (ref 98–111)
CO2: 27 mmol/L (ref 22–32)
Calcium: 9.3 mg/dL (ref 8.9–10.3)
Creatinine: 0.68 mg/dL (ref 0.44–1.00)
Glucose, Bld: 120 mg/dL — ABNORMAL HIGH (ref 70–99)
Potassium: 2.9 mmol/L — CL (ref 3.5–5.1)
SODIUM: 137 mmol/L (ref 135–145)
Total Bilirubin: 0.6 mg/dL (ref 0.3–1.2)
Total Protein: 8.1 g/dL (ref 6.5–8.1)

## 2018-05-11 LAB — CBC WITH DIFFERENTIAL (CANCER CENTER ONLY)
BASOS PCT: 0 %
Basophils Absolute: 0 10*3/uL (ref 0.0–0.1)
EOS ABS: 0 10*3/uL (ref 0.0–0.5)
Eosinophils Relative: 0 %
HEMATOCRIT: 27.5 % — AB (ref 34.8–46.6)
HEMOGLOBIN: 9.2 g/dL — AB (ref 11.6–15.9)
LYMPHS ABS: 0.5 10*3/uL — AB (ref 0.9–3.3)
Lymphocytes Relative: 12 %
MCH: 30.7 pg (ref 25.1–34.0)
MCHC: 33.5 g/dL (ref 31.5–36.0)
MCV: 91.7 fL (ref 79.5–101.0)
MONOS PCT: 6 %
Monocytes Absolute: 0.2 10*3/uL (ref 0.1–0.9)
NEUTROS ABS: 3.3 10*3/uL (ref 1.5–6.5)
NEUTROS PCT: 82 %
Platelet Count: 170 10*3/uL (ref 145–400)
RBC: 3 MIL/uL — ABNORMAL LOW (ref 3.70–5.45)
RDW: 19 % — ABNORMAL HIGH (ref 11.2–14.5)
WBC: 4 10*3/uL (ref 3.9–10.3)

## 2018-05-11 MED ORDER — PROCHLORPERAZINE MALEATE 10 MG PO TABS
ORAL_TABLET | ORAL | Status: AC
  Filled 2018-05-11: qty 1

## 2018-05-11 MED ORDER — SODIUM CHLORIDE 0.9 % IV SOLN
Freq: Once | INTRAVENOUS | Status: AC
  Administered 2018-05-11: 15:00:00 via INTRAVENOUS
  Filled 2018-05-11: qty 250

## 2018-05-11 MED ORDER — HEPARIN SOD (PORK) LOCK FLUSH 100 UNIT/ML IV SOLN
500.0000 [IU] | Freq: Once | INTRAVENOUS | Status: AC | PRN
  Administered 2018-05-11: 500 [IU]
  Filled 2018-05-11: qty 5

## 2018-05-11 MED ORDER — PROCHLORPERAZINE MALEATE 10 MG PO TABS
10.0000 mg | ORAL_TABLET | Freq: Once | ORAL | Status: AC
  Administered 2018-05-11: 10 mg via ORAL

## 2018-05-11 MED ORDER — SODIUM CHLORIDE 0.9 % IV SOLN
2000.0000 mg | Freq: Once | INTRAVENOUS | Status: AC
  Administered 2018-05-11: 2000 mg via INTRAVENOUS
  Filled 2018-05-11: qty 52.6

## 2018-05-11 MED ORDER — SODIUM CHLORIDE 0.9% FLUSH
10.0000 mL | INTRAVENOUS | Status: DC | PRN
  Administered 2018-05-11: 10 mL
  Filled 2018-05-11: qty 10

## 2018-05-11 MED ORDER — POTASSIUM CHLORIDE CRYS ER 20 MEQ PO TBCR: EXTENDED_RELEASE_TABLET | ORAL | 0 refills | Status: DC

## 2018-05-11 MED ORDER — ONDANSETRON 4 MG PO TBDP: 4.0000 mg | ORAL_TABLET | Freq: Three times a day (TID) | ORAL | 0 refills | Status: DC | PRN

## 2018-05-11 MED FILL — ONDANSETRON ODT 4 MG TABLET: 4 | 6 days supply | Qty: 20 | Fill #0

## 2018-05-11 MED FILL — POTASSIUM CL ER 20 MEQ TABL: 20 | 30 days supply | Qty: 120 | Fill #0

## 2018-05-11 NOTE — Patient Instructions (Signed)
[facility] Discharge Instructions for Patients Receiving Chemotherapy  Today you received the following chemotherapy agents:  Gemcitabine  To help prevent nausea and vomiting after your treatment, we encourage you to take your nausea medication as prescribed.    If you develop nausea and vomiting that is not controlled by your nausea medication, call the clinic.   BELOW ARE SYMPTOMS THAT SHOULD BE REPORTED IMMEDIATELY:  *FEVER GREATER THAN 100.5 F  *CHILLS WITH OR WITHOUT FEVER  NAUSEA AND VOMITING THAT IS NOT CONTROLLED WITH YOUR NAUSEA MEDICATION  *UNUSUAL SHORTNESS OF BREATH  *UNUSUAL BRUISING OR BLEEDING  TENDERNESS IN MOUTH AND THROAT WITH OR WITHOUT PRESENCE OF ULCERS  *URINARY PROBLEMS  *BOWEL PROBLEMS  UNUSUAL RASH Items with * indicate a potential emergency and should be followed up as soon as possible.  Feel free to call the clinic should you have any questions or concerns. The clinic phone number is [phone].  Please show the CHEMO ALERT CARD at check-in to the Emergency Department and triage nurse.   

## 2018-05-12 ENCOUNTER — Telehealth: Payer: Self-pay | Admitting: Internal Medicine

## 2018-05-12 ENCOUNTER — Other Ambulatory Visit: Payer: Self-pay | Admitting: Medical Oncology

## 2018-05-12 ENCOUNTER — Inpatient Hospital Stay: Payer: Medicare Other

## 2018-05-12 ENCOUNTER — Telehealth: Payer: Self-pay | Admitting: Medical Oncology

## 2018-05-12 ENCOUNTER — Inpatient Hospital Stay (HOSPITAL_BASED_OUTPATIENT_CLINIC_OR_DEPARTMENT_OTHER): Payer: Medicare Other | Admitting: Medical

## 2018-05-12 VITALS — BP 119/71 | HR 105 | Temp 98.4°F | Resp 18

## 2018-05-12 DIAGNOSIS — C7989 Secondary malignant neoplasm of other specified sites: Secondary | ICD-10-CM | POA: Diagnosis not present

## 2018-05-12 DIAGNOSIS — G893 Neoplasm related pain (acute) (chronic): Secondary | ICD-10-CM

## 2018-05-12 DIAGNOSIS — R112 Nausea with vomiting, unspecified: Secondary | ICD-10-CM

## 2018-05-12 DIAGNOSIS — E860 Dehydration: Secondary | ICD-10-CM

## 2018-05-12 DIAGNOSIS — C3491 Malignant neoplasm of unspecified part of right bronchus or lung: Secondary | ICD-10-CM

## 2018-05-12 DIAGNOSIS — C3481 Malignant neoplasm of overlapping sites of right bronchus and lung: Secondary | ICD-10-CM | POA: Diagnosis not present

## 2018-05-12 DIAGNOSIS — Z5111 Encounter for antineoplastic chemotherapy: Secondary | ICD-10-CM | POA: Diagnosis not present

## 2018-05-12 DIAGNOSIS — C3490 Malignant neoplasm of unspecified part of unspecified bronchus or lung: Secondary | ICD-10-CM

## 2018-05-12 DIAGNOSIS — E876 Hypokalemia: Secondary | ICD-10-CM

## 2018-05-12 LAB — CMP (CANCER CENTER ONLY)
ALK PHOS: 137 U/L — AB (ref 38–126)
ALT: 28 U/L (ref 0–44)
AST: 40 U/L (ref 15–41)
Albumin: 2.8 g/dL — ABNORMAL LOW (ref 3.5–5.0)
Anion gap: 11 (ref 5–15)
BUN: 6 mg/dL (ref 6–20)
CALCIUM: 9.6 mg/dL (ref 8.9–10.3)
CHLORIDE: 101 mmol/L (ref 98–111)
CO2: 25 mmol/L (ref 22–32)
Creatinine: 0.63 mg/dL (ref 0.44–1.00)
GFR, Est AFR Am: >60 mL/min (ref >60)
Glucose, Bld: 125 mg/dL — ABNORMAL HIGH (ref 70–99)
Potassium: 3 mmol/L — CL (ref 3.5–5.1)
Sodium: 137 mmol/L (ref 135–145)
Total Bilirubin: 0.9 mg/dL (ref 0.3–1.2)
Total Protein: 8.1 g/dL (ref 6.5–8.1)

## 2018-05-12 MED ORDER — ONDANSETRON HCL 8 MG PO TABS: 8.0000 mg | ORAL_TABLET | Freq: Three times a day (TID) | ORAL | 3 refills | Status: DC | PRN

## 2018-05-12 MED ORDER — OXYCODONE HCL 5 MG PO TABS: 5.0000 mg | ORAL_TABLET | Freq: Four times a day (QID) | ORAL | 0 refills | Status: DC | PRN

## 2018-05-12 MED ORDER — SODIUM CHLORIDE 0.9% FLUSH
10.0000 mL | INTRAVENOUS | Status: DC | PRN
  Administered 2018-05-12: 10 mL
  Filled 2018-05-12: qty 10

## 2018-05-12 MED ORDER — SODIUM CHLORIDE 0.9 % IV SOLN: Freq: Once | INTRAVENOUS | Status: DC

## 2018-05-12 MED ORDER — MORPHINE SULFATE 4 MG/ML IJ SOLN
2.0000 mg | Freq: Once | INTRAMUSCULAR | Status: AC
  Administered 2018-05-12: 2 mg via INTRAVENOUS
  Filled 2018-05-12: qty 1

## 2018-05-12 MED ORDER — ONDANSETRON HCL 4 MG/2ML IJ SOLN: 8.0000 mg | Freq: Once | INTRAMUSCULAR | Status: DC

## 2018-05-12 MED ORDER — LIDOCAINE 5 % EX PTCH: 1.0000 | MEDICATED_PATCH | CUTANEOUS | 0 refills | Status: AC

## 2018-05-12 MED ORDER — SODIUM CHLORIDE 0.9 % IV SOLN: 8.0000 mg | Freq: Once | INTRAVENOUS | Status: DC

## 2018-05-12 MED ORDER — LORAZEPAM 0.5 MG PO TABS: ORAL_TABLET | ORAL | 0 refills | Status: DC

## 2018-05-12 MED ORDER — SODIUM CHLORIDE 0.9 % IV SOLN
Freq: Once | INTRAVENOUS | Status: AC
  Administered 2018-05-12: 15:00:00 via INTRAVENOUS
  Filled 2018-05-12: qty 250

## 2018-05-12 MED ORDER — LORAZEPAM 1 MG PO TABS
ORAL_TABLET | ORAL | Status: AC
  Filled 2018-05-12: qty 1

## 2018-05-12 MED ORDER — ONDANSETRON HCL 4 MG/2ML IJ SOLN
8.0000 mg | Freq: Once | INTRAMUSCULAR | Status: AC
  Administered 2018-05-12: 8 mg via INTRAVENOUS

## 2018-05-12 MED ORDER — HEPARIN SOD (PORK) LOCK FLUSH 100 UNIT/ML IV SOLN
250.0000 [IU] | Freq: Once | INTRAVENOUS | Status: AC | PRN
  Administered 2018-05-12: 500 [IU]
  Filled 2018-05-12: qty 5

## 2018-05-12 MED ORDER — ONDANSETRON HCL 4 MG/2ML IJ SOLN
INTRAMUSCULAR | Status: AC
  Filled 2018-05-12: qty 4

## 2018-05-12 MED ORDER — LORAZEPAM 1 MG PO TABS
0.5000 mg | ORAL_TABLET | Freq: Once | ORAL | Status: AC
  Administered 2018-05-12: 0.5 mg via SUBLINGUAL

## 2018-05-12 MED ORDER — MORPHINE SULFATE (PF) 4 MG/ML IV SOLN
INTRAVENOUS | Status: AC
  Filled 2018-05-12: qty 1

## 2018-05-12 MED FILL — LORazepam 0.5 MG TABS: 0.5 | 20 days supply | Qty: 40 | Fill #0

## 2018-05-12 MED FILL — oxyCODONE HCL 5 MG TABS: 5 | 15 days supply | Qty: 60 | Fill #0

## 2018-05-12 MED FILL — ONDANSETRON HCL 8 MG TABLET: 8 | 7 days supply | Qty: 20 | Fill #0

## 2018-05-12 NOTE — Patient Instructions (Signed)
Dehydration, Adult Dehydration is when there is not enough fluid or water in your body. This happens when you lose more fluids than you take in. Dehydration can range from mild to very bad. It should be treated right away to keep it from getting very bad. Symptoms of mild dehydration may include:  Thirst.  Dry lips.  Slightly dry mouth.  Dry, warm skin.  Dizziness. Symptoms of moderate dehydration may include:  Very dry mouth.  Muscle cramps.  Dark pee (urine). Pee may be the color of tea.  Your body making less pee.  Your eyes making fewer tears.  Heartbeat that is uneven or faster than normal (palpitations).  Headache.  Light-headedness, especially when you stand up from sitting.  Fainting (syncope). Symptoms of very bad dehydration may include:  Changes in skin, such as: ? Cold and clammy skin. ? Blotchy (mottled) or pale skin. ? Skin that does not quickly return to normal after being lightly pinched and let go (poor skin turgor).  Changes in body fluids, such as: ? Feeling very thirsty. ? Your eyes making fewer tears. ? Not sweating when body temperature is high, such as in hot weather. ? Your body making very little pee.  Changes in vital signs, such as: ? Weak pulse. ? Pulse that is more than 100 beats a minute when you are sitting still. ? Fast breathing. ? Low blood pressure.  Other changes, such as: ? Sunken eyes. ? Cold hands and feet. ? Confusion. ? Lack of energy (lethargy). ? Trouble waking up from sleep. ? Short-term weight loss. ? Unconsciousness. Follow these instructions at home:  If told by your doctor, drink an ORS: ? Make an ORS by using instructions on the package. ? Start by drinking small amounts, about  cup (120 mL) every 5-10 minutes. ? Slowly drink more until you have had the amount that your doctor said to have.  Drink enough clear fluid to keep your pee clear or pale yellow. If you were told to drink an ORS, finish the ORS  first, then start slowly drinking clear fluids. Drink fluids such as: ? Water. Do not drink only water by itself. Doing that can make the salt (sodium) level in your body get too low (hyponatremia). ? Ice chips. ? Fruit juice that you have added water to (diluted). ? Low-calorie sports drinks.  Avoid: ? Alcohol. ? Drinks that have a lot of sugar. These include high-calorie sports drinks, fruit juice that does not have water added, and soda. ? Caffeine. ? Foods that are greasy or have a lot of fat or sugar.  Take over-the-counter and prescription medicines only as told by your doctor.  Do not take salt tablets. Doing that can make the salt level in your body get too high (hypernatremia).  Eat foods that have minerals (electrolytes). Examples include bananas, oranges, potatoes, tomatoes, and spinach.  Keep all follow-up visits as told by your doctor. This is important. Contact a doctor if:  You have belly (abdominal) pain that: ? Gets worse. ? Stays in one area (localizes).  You have a rash.  You have a stiff neck.  You get angry or annoyed more easily than normal (irritability).  You are more sleepy than normal.  You have a harder time waking up than normal.  You feel: ? Weak. ? Dizzy. ? Very thirsty.  You have peed (urinated) only a small amount of very dark pee during 6-8 hours. Get help right away if:  You have symptoms of   very bad dehydration.  You cannot drink fluids without throwing up (vomiting).  Your symptoms get worse with treatment.  You have a fever.  You have a very bad headache.  You are throwing up or having watery poop (diarrhea) and it: ? Gets worse. ? Does not go away.  You have blood or something green (bile) in your throw-up.  You have blood in your poop (stool). This may cause poop to look black and tarry.  You have not peed in 6-8 hours.  You pass out (faint).  Your heart rate when you are sitting still is more than 100 beats a  minute.  You have trouble breathing. This information is not intended to replace advice given to you by your health care provider. Make sure you discuss any questions you have with your health care provider. Document Released: 07/04/2009 Document Revised: 03/27/2016 Document Reviewed: 11/01/2015 Elsevier Interactive Patient Education  2018 [employer]  

## 2018-05-12 NOTE — Telephone Encounter (Signed)
PT scheduled per 8/22 sch message.

## 2018-05-12 NOTE — Telephone Encounter (Signed)
[facility] today

## 2018-05-13 NOTE — Progress Notes (Signed)
[facility]pt[facility] Management Clinic Progress Note   DOYLENE SPLINTER [id number] [birth year] 57 y.o.  Theresa [name] is managed by Dr. Fanny Bien. Mohamed  Actively treated with chemotherapy/immunotherapy: yes  Current Therapy: Gemcitabine  Last Treated: 05/11/2018 (cycle 6, day 8)  Assessment: Plan:    Nausea and vomiting, intractability of vomiting not specified, unspecified vomiting type - Plan: ondansetron (ZOFRAN) 8 MG tablet, LORazepam (ATIVAN) 0.5 MG tablet, LORazepam (ATIVAN) tablet 0.5 mg, 0.9 %  sodium chloride infusion, ondansetron (ZOFRAN) injection 8 mg, ondansetron (ZOFRAN) injection 8 mg, DISCONTINUED: ondansetron (ZOFRAN) 8 mg in sodium chloride 0.9 % 50 mL IVPB, DISCONTINUED: ondansetron (ZOFRAN) 8 mg in sodium chloride 0.9 % 50 mL IVPB, DISCONTINUED: ondansetron (ZOFRAN) 8 mg in sodium chloride 0.9 % 50 mL IVPB  Metastatic primary lung cancer, right (HCC) - Plan: oxyCODONE (OXY IR/ROXICODONE) 5 MG immediate release tablet, ondansetron (ZOFRAN) injection 8 mg, ondansetron (ZOFRAN) injection 8 mg, DISCONTINUED: ondansetron (ZOFRAN) 8 mg in sodium chloride 0.9 % 50 mL IVPB  Neoplasm related pain - Plan: morphine 4 MG/ML injection 2 mg  Dehydration - Plan: heparin lock flush 100 unit/mL, sodium chloride flush (NS) 0.9 % injection 10 mL   Nausea and vomiting: The patient was given Zofran 8 mg IV push, Ativan 0.5 mg sublingual and a refill for Zofran and sublingual Ativan.  Metastatic primary lung cancer: The patient is status post cycle 6, day 8 of gemcitabine which was dosed on 05/11/2018.  She is scheduled to be seen in follow-up on 05/25/2018.  Neoplasm related pain: The patient was given morphine 2 mg IV today.  She was also given a refill of oxycodone.  Dehydration: The patient was given 1.5 L of normal saline IV.  Please see After Visit Summary for patient specific instructions.  Future Appointments  Date Time Provider [place]  05/25/2018  1:00 PM [facility]  LAB 4 [facility] None  05/25/2018  1:15 PM [facility] [place] FLUSH [facility] None  05/25/2018  2:00 PM Curcio, Kristin R, NP [facility] None  05/25/2018  3:00 PM [facility] INFUSION [facility] None  05/31/2018  1:45 PM [facility] LAB 6 [facility] None  05/31/2018  2:00 PM [facility] [place] FLUSH [facility] None  05/31/2018  3:00 PM [facility] INFUSION [facility] None  06/16/2018  1:15 PM [facility] LAB 4 [facility] None  06/16/2018  1:30 PM [facility] [place] FLUSH [facility] None  06/16/2018  2:00 PM Curcio, Kristin R, NP [facility] None  06/16/2018  2:45 PM [facility] INFUSION [facility] None  06/23/2018 10:15 AM [facility] LAB 6 [facility] None  06/23/2018 10:45 AM [facility] [place] FLUSH [facility] None  06/23/2018 12:00 PM [facility] INFUSION [facility] None  07/07/2018 10:15 AM [facility] LAB 6 [facility] None  07/07/2018 10:30 AM [facility] [place] FLUSH [facility] None  07/07/2018 11:00 AM Curt Bears, MD [facility] None  07/07/2018 11:45 AM [facility] INFUSION [facility] None  07/14/2018 12:00 PM [facility] LAB 4 [facility] None  07/14/2018 12:15 PM [facility] [place] FLUSH [facility] None  07/14/2018  1:15 PM [facility] INFUSION [facility] None    No orders of the defined types were placed in this encounter.      Subjective:   Patient ID:  ALORAH MCREE is a 57 y.o. (DOB 10-29-1960) female.  Chief Complaint:  Chief Complaint  Patient presents with  . Dehydration    HPI Theresa [name] is a 57 year old female with a history of a metastatic non-small cell lung cancer who is status post cycle 6, day 8 of gemcitabine which was dosed on 05/11/2018.  She continues to  be followed by Dr. Parthenia Ames. Mohammed.  She presents to the office today with nausea and vomiting since yesterday evening.  She is having pain in her back.  She has had very little p.o. intake over the past 24 hours due to nausea and vomiting.  She requests refills of Zofran and Ativan which she uses for nausea and  oxycodone for pain.  Medications: I have reviewed the patient's current medications.  Allergies: No Known Allergies  Past Medical History:  Diagnosis Date  . Cancer ([place])    5 years ago - cervical cancer  . Encounter for antineoplastic chemotherapy 08/27/2015  . History of radiation therapy 08/19/2015 - 10/07/2015   Site/dose:   The patient's primary tumor and involved lymph nodes were treated to 66 Gy in 30 fractions.  . Hypokalemia 09/30/2015  . Non-small cell carcinoma of lung, stage 3 ([place]) 08/08/2015  . Paroxysmal atrial flutter ([place]) 09/16/2015  . Pneumonia     Past Surgical History:  Procedure Laterality Date  . IR FLUORO GUIDE PORT INSERTION RIGHT  05/21/2017  . IR US GUIDE VASC ACCESS RIGHT  05/21/2017  . TUBAL LIGATION    . VIDEO BRONCHOSCOPY WITH ENDOBRONCHIAL ULTRASOUND N/A 08/02/2015   Procedure: VIDEO BRONCHOSCOPY WITH ENDOBRONCHIAL ULTRASOUND;  Surgeon: [name], MD;  Location[facility] OR;  Service: Thoracic;  Laterality: N/A;  . WISDOM TOOTH EXTRACTION      Family History  Problem Relation Age of Onset  . COPD Mother   . Diabetes type II Mother   . Heart disease Mother   . High Cholesterol Mother   . Lung cancer Father     Social History   Socioeconomic History  . Marital status: Single    Spouse name: Not on file  . Number of children: Not on file  . Years of education: Not on file  . Highest education level: Not on file  Occupational History  . Not on file  Social Needs  . Financial resource strain: Not on file  . Food insecurity:    Worry: Not on file    Inability: Not on file  . Transportation needs:    Medical: Not on file    Non-medical: Not on file  Tobacco Use  . Smoking status: Former Smoker    Packs/day: 0.30    Years: 5.00    Pack years: 1.50    Types: Cigarettes    Last attempt to quit: 07/31/1990    Years since quitting: 27.8  . Smokeless tobacco: Never Used  Substance and Sexual Activity  . Alcohol use: No     Alcohol/week: 0.0 standard drinks  . Drug use: No  . Sexual activity: Not Currently  Lifestyle  . Physical activity:    Days per week: Not on file    Minutes per session: Not on file  . Stress: Not on file  Relationships  . Social connections:    Talks on phone: Not on file    Gets together: Not on file    Attends religious service: Not on file    Active member of club or organization: Not on file    Attends meetings of clubs or organizations: Not on file    Relationship status: Not on file  . Intimate partner violence:    Fear of current or ex partner: Not on file    Emotionally abused: Not on file    Physically abused: Not on file    Forced sexual activity: Not on file  Other Topics Concern  .  Not on file  Social History Narrative  . Not on file    Past Medical History, Surgical history, Social history, and Family history were reviewed and updated as appropriate.   Please see review of systems for further details on the patient's review from today.   Review of Systems:  Review of Systems  Constitutional: Positive for appetite change. Negative for chills, diaphoresis and fever.  Respiratory: Negative for cough, choking, shortness of breath and wheezing.   Cardiovascular: Negative for chest pain and palpitations.  Gastrointestinal: Positive for nausea and vomiting. Negative for constipation and diarrhea.  Genitourinary: Negative for decreased urine volume.  Musculoskeletal: Positive for back pain.  Neurological: Negative for headaches.    Objective:   Physical Exam:  BP 119/71 (BP Location: Left Arm, Patient Position: Sitting)   Pulse (!) 105   Temp 98.4 F (36.9 C) (Oral)   Resp 18   LMP 03/21/2008   SpO2 100%  ECOG: 1  Physical Exam  Constitutional: No distress.  The patient is an adult female who appears to be chronically ill and fatigued but in no acute distress.  HENT:  Head: Normocephalic and atraumatic.  Mouth/Throat: Oropharynx is clear and moist.    Cardiovascular: Normal rate, regular rhythm and normal heart sounds. Exam reveals no gallop and no friction rub.  No murmur heard. Pulmonary/Chest: Effort normal and breath sounds normal. No stridor. No respiratory distress. She has no wheezes. She has no rales.  Abdominal: Soft. Bowel sounds are normal. She exhibits no distension and no mass. There is no tenderness. There is no rebound and no guarding.  Neurological: She is alert. Coordination (Ms. Dudenhoeffer is ambulating with the use of a wheelchair.) abnormal.  Skin: Skin is warm and dry. She is not diaphoretic.    Lab Review:     Component Value Date/Time   NA 137 05/12/2018 1440   NA 138 09/24/2017 1600   K 3.0 (LL) 05/12/2018 1440   K 3.4 (L) 09/24/2017 1600   CL 101 05/12/2018 1440   CO2 25 05/12/2018 1440   CO2 25 09/24/2017 1600   GLUCOSE 125 (H) 05/12/2018 1440   GLUCOSE 125 09/24/2017 1600   BUN 6 05/12/2018 1440   BUN 9.5 09/24/2017 1600   CREATININE 0.63 05/12/2018 1440   CREATININE 0.8 09/24/2017 1600   CALCIUM 9.6 05/12/2018 1440   CALCIUM 9.0 09/24/2017 1600   PROT 8.1 05/12/2018 1440   PROT 7.4 09/24/2017 1600   ALBUMIN 2.8 (L) 05/12/2018 1440   ALBUMIN 3.5 09/24/2017 1600   AST 40 05/12/2018 1440   AST 31 09/24/2017 1600   ALT 28 05/12/2018 1440   ALT 24 09/24/2017 1600   ALKPHOS 137 (H) 05/12/2018 1440   ALKPHOS 92 09/24/2017 1600   BILITOT 0.9 05/12/2018 1440   BILITOT 0.40 09/24/2017 1600   GFRNONAA >60 05/12/2018 1440   GFRAA >60 05/12/2018 1440       Component Value Date/Time   WBC 4.0 05/11/2018 1235   WBC 4.9 01/04/2018 1115   RBC 3.00 (L) 05/11/2018 1235   HGB 9.2 (L) 05/11/2018 1235   HGB 8.3 (L) 09/24/2017 1601   HCT 27.5 (L) 05/11/2018 1235   HCT 24.5 (L) 09/24/2017 1601   PLT 170 05/11/2018 1235   PLT 92 (L) 09/24/2017 1601   PLT 300 02/11/2017 1632   MCV 91.7 05/11/2018 1235   MCV 90.7 09/24/2017 1601   MCH 30.7 05/11/2018 1235   MCHC 33.5 05/11/2018 1235   RDW 19.0 (H)  05/11/2018 1235  RDW 18.7 (H) 09/24/2017 1601   LYMPHSABS 0.5 (L) 05/11/2018 1235   LYMPHSABS 0.8 (L) 09/24/2017 1601   MONOABS 0.2 05/11/2018 1235   MONOABS 0.2 09/24/2017 1601   EOSABS 0.0 05/11/2018 1235   EOSABS 0.0 09/24/2017 1601   BASOSABS 0.0 05/11/2018 1235   BASOSABS 0.0 09/24/2017 1601   -------------------------------  Imaging from last 24 hours (if applicable):  Radiology interpretation: Dg Chest Right Decubitus  Result Date: 04/20/2018 CLINICAL DATA:  Metastatic lung cancer with recurrent right pleural effusion. EXAM: CHEST - RIGHT DECUBITUS COMPARISON:  CT of the chest 03/21/2018 FINDINGS: Small loculated right pleural effusion. No evidence of pneumothorax. Right-sided injectable port terminates at the level of the cavoatrial junction. Left lung appears clear. Normal cardiac silhouette. IMPRESSION: Small loculated right pleural effusion. Electronically Signed   By: Fidela Salisbury M.D.   On: 04/20/2018 16:01

## 2018-05-16 DIAGNOSIS — D649 Anemia, unspecified: Secondary | ICD-10-CM | POA: Diagnosis not present

## 2018-05-16 DIAGNOSIS — I482 Chronic atrial fibrillation: Secondary | ICD-10-CM | POA: Diagnosis not present

## 2018-05-16 DIAGNOSIS — Z8701 Personal history of pneumonia (recurrent): Secondary | ICD-10-CM | POA: Diagnosis not present

## 2018-05-16 DIAGNOSIS — G629 Polyneuropathy, unspecified: Secondary | ICD-10-CM | POA: Diagnosis not present

## 2018-05-16 DIAGNOSIS — C3490 Malignant neoplasm of unspecified part of unspecified bronchus or lung: Secondary | ICD-10-CM | POA: Diagnosis not present

## 2018-05-17 NOTE — Progress Notes (Signed)
PA for Lidocaine 5% patches has been submitted. 

## 2018-05-18 DIAGNOSIS — G629 Polyneuropathy, unspecified: Secondary | ICD-10-CM | POA: Diagnosis not present

## 2018-05-18 DIAGNOSIS — C3490 Malignant neoplasm of unspecified part of unspecified bronchus or lung: Secondary | ICD-10-CM | POA: Diagnosis not present

## 2018-05-18 DIAGNOSIS — I482 Chronic atrial fibrillation: Secondary | ICD-10-CM | POA: Diagnosis not present

## 2018-05-18 DIAGNOSIS — Z8701 Personal history of pneumonia (recurrent): Secondary | ICD-10-CM | POA: Diagnosis not present

## 2018-05-18 DIAGNOSIS — D649 Anemia, unspecified: Secondary | ICD-10-CM | POA: Diagnosis not present

## 2018-05-19 DIAGNOSIS — I482 Chronic atrial fibrillation: Secondary | ICD-10-CM | POA: Diagnosis not present

## 2018-05-19 DIAGNOSIS — G629 Polyneuropathy, unspecified: Secondary | ICD-10-CM | POA: Diagnosis not present

## 2018-05-19 DIAGNOSIS — C3490 Malignant neoplasm of unspecified part of unspecified bronchus or lung: Secondary | ICD-10-CM | POA: Diagnosis not present

## 2018-05-19 DIAGNOSIS — Z8701 Personal history of pneumonia (recurrent): Secondary | ICD-10-CM | POA: Diagnosis not present

## 2018-05-19 DIAGNOSIS — D649 Anemia, unspecified: Secondary | ICD-10-CM | POA: Diagnosis not present

## 2018-05-24 ENCOUNTER — Telehealth: Payer: Self-pay | Admitting: Medical Oncology

## 2018-05-24 NOTE — Telephone Encounter (Signed)
Scan not scheduled. Message sent to central scheduling and notified pt to keep phone available for call.

## 2018-05-25 ENCOUNTER — Inpatient Hospital Stay: Payer: Medicare Other

## 2018-05-25 ENCOUNTER — Telehealth: Payer: Self-pay | Admitting: Medical Oncology

## 2018-05-25 ENCOUNTER — Inpatient Hospital Stay: Payer: Medicare Other | Admitting: Oncology

## 2018-05-25 NOTE — Telephone Encounter (Signed)
Diane  Do you need for me to send the scheduling message to get her appts changed?  Thanks

## 2018-05-25 NOTE — Telephone Encounter (Signed)
Pt thought today was Tuesday . She cannot make appt. Today. She will r/s after scan.

## 2018-05-25 NOTE — Assessment & Plan Note (Deleted)
This is a very pleasant 57 years old African-American female with metastatic non-small cell lung cancer that was initially diagnosed as stage IIIAnon-small cell lung cancer, squamous cell carcinoma presented with large right hilar mass and mediastinal lymphadenopathy diagnosed in November 2016. She status post concurrent chemoradiation followed by consolidation chemotherapy. The patient was found recently to have intramuscular metastases and the right posterior chest wall that was biopsy-proven to be squamous cell carcinoma with PDL 1 expression 50%. The patient was a started on treatment with immunotherapy with Ketruda (pembrolizumab) status post 3 cycles. Unfortunately imaging studies after the treatment with Keytruda showed evidence for disease progression. The patient is currently undergoing treatment with carboplatin for AUC of 5 and paclitaxel 175 mg/M2 status post6cycles.  This was discontinued secondary to disease progression. The patient was started on systemic chemotherapy with single agent gemcitabine 1000 mg/M2 on days 1 and 8 every 3 weeks status post 5 cycles and has been tolerating this treatment well. I recommended for the patient to proceed with cycle #6 today as scheduled. I will see her back for follow-up visit in 3 weeks for evaluation after repeating CT scan of the chest, abdomen and pelvis for restaging of her disease. For pain management she will continue with her current pain medications with OxyIR as well as gabapentin. The patient was advised to call immediately if she has any concerning symptoms in the interval. The patient voices understanding of current disease status and treatment options and is in agreement with the current care plan.  All questions were answered. The patient knows to call the clinic with any problems, questions or concerns. We can certainly see the patient much sooner if necessary.

## 2018-05-25 NOTE — Progress Notes (Deleted)
[place] OFFICE PROGRESS NOTE  Theresa Eagles, PA-C [place] [address]  DIAGNOSIS:Metastatic non-small cell lung cancer initially diagnosed as Stage IIIA (T2b, N2, M0) non-small cell lung cancer, invasive poorly differentiated squamous cell carcinoma diagnosed in November 2016 and presented with large right hilar mass with collapse of the right middle lobe and extension into the right upper lobe and right lower lobe along the hilum with right paratracheal and subcarinal lymphadenopathy. She presented in July 2018 with metastatic intramuscular mass in the right posterior lateral chest wall, with biopsy confirmed squamous cell carcinoma. PDL1 Expression: 50%.  PRIOR THERAPY: 1) Concurrent chemoradiation with weekly carboplatin for AUC of 2 and paclitaxel 45 MG/M2. First dose 08/19/2015. Status post 5 cycles. Last cycle was given 10/07/2015 with significant response. 2) Consolidation chemotherapy with carboplatin for AUC of 5 and paclitaxel 175 MG/M2 every 3 weeks with Neulasta support. First dose 11/18/2015. Status post 3 cycles. 3) palliative radiotherapy to the intramuscular mass in the right posterior lateral chest wall under the care of Dr. Tammi Klippel. 4) Ketruda 200 mg IV every 3 weeks. First dose 05/12/2017. Status post 3 cycles. This was discontinued secondary to disease progression. 5)Systemic chemotherapy with carboplatin for AUC of 5 and paclitaxel 175 mg/M2 every 3 weeks with Neulasta support. First dose August 26, 2017. Status post6cycles.  CURRENT THERAPY:Gemzar 1000 mg/m day 1 and day 8 of 21-day cycle. First dose 01/12/2018.Status post6cycles.  INTERVAL HISTORY: Theresa [name] 57 y.o. female returns for *** regular *** visit for followup of ***   MEDICAL HISTORY: Past Medical History:  Diagnosis Date  . Cancer ([place])    5 years ago - cervical cancer  . Encounter for antineoplastic chemotherapy 08/27/2015  . History of radiation  therapy 08/19/2015 - 10/07/2015   Site/dose:   The patient's primary tumor and involved lymph nodes were treated to 66 Gy in 30 fractions.  . Hypokalemia 09/30/2015  . Non-small cell carcinoma of lung, stage 3 ([place]) 08/08/2015  . Paroxysmal atrial flutter ([place]) 09/16/2015  . Pneumonia     ALLERGIES:  has No Known Allergies.  MEDICATIONS:  Current Outpatient Medications  Medication Sig Dispense Refill  . gabapentin (NEURONTIN) 300 MG capsule Take 1 capsule in the morning, 1 capsule in the afternoon, and 2 capsules at bedtime. 120 capsule 0  . ibuprofen (ADVIL,MOTRIN) 800 MG tablet Take 1 tablet by mouth every 8 (eight) hours as needed.    . lidocaine (LIDODERM) 5 % Place 1 patch onto the skin daily. Remove & Discard patch within 12 hours or as directed by MD 30 patch 0  . lidocaine-prilocaine (EMLA) cream Apply 1 application topically as needed. 30 g 2  . LORazepam (ATIVAN) 0.5 MG tablet 0.5 mg sublingual p.o. every 6 hours as needed for nausea and/or vomiting. 40 tablet 0  . metoprolol tartrate (LOPRESSOR) 25 MG tablet Take 0.5 tablets (12.5 mg total) by mouth 2 (two) times daily. 90 tablet 1  . mirtazapine (REMERON) 30 MG tablet Take 1 tablet (30 mg total) by mouth at bedtime. 30 tablet 1  . Multiple Vitamin (MULTIVITAMIN) capsule Take 1 capsule daily by mouth. 30 capsule 2  . ondansetron (ZOFRAN ODT) 4 MG disintegrating tablet Take 1 tablet (4 mg total) by mouth every 8 (eight) hours as needed for nausea or vomiting. 20 tablet 0  . ondansetron (ZOFRAN) 8 MG tablet Take 1 tablet (8 mg total) by mouth every 8 (eight) hours as needed for nausea or vomiting. 20 tablet 3  .  oxyCODONE (OXY IR/ROXICODONE) 5 MG immediate release tablet Take 1 tablet (5 mg total) by mouth every 6 (six) hours as needed for severe pain. 60 tablet 0  . potassium chloride SA (K-DUR,KLOR-CON) 20 MEQ tablet Take 2 tabs every morning and 2 tab every evening. 120 tablet 0  . prochlorperazine (COMPAZINE) 10 MG tablet Take 1  tablet (10 mg total) by mouth every 6 (six) hours as needed for nausea or vomiting. 30 tablet 1   No current facility-administered medications for this visit.    Facility-Administered Medications Ordered in Other Visits  Medication Dose Route Frequency Provider Last Rate Last Dose  . 0.9 %  sodium chloride infusion   Intravenous Continuous Curcio, Kristin R, NP      . 0.9 %  sodium chloride infusion   Intravenous Continuous Curt Bears, MD   Stopped at 05/31/17 1714  . ondansetron (ZOFRAN) injection 8 mg  8 mg Intravenous Once Curt Bears, MD        SURGICAL HISTORY:  Past Surgical History:  Procedure Laterality Date  . IR FLUORO GUIDE PORT INSERTION RIGHT  05/21/2017  . IR US GUIDE VASC ACCESS RIGHT  05/21/2017  . TUBAL LIGATION    . VIDEO BRONCHOSCOPY WITH ENDOBRONCHIAL ULTRASOUND N/A 08/02/2015   Procedure: VIDEO BRONCHOSCOPY WITH ENDOBRONCHIAL ULTRASOUND;  Surgeon: [name], MD;  Location: [facility];  Service: Thoracic;  Laterality: N/A;  . WISDOM TOOTH EXTRACTION      REVIEW OF SYSTEMS:   Review of Systems  Constitutional: Negative for appetite change, chills, fatigue, fever and unexpected weight change.  HENT:   Negative for mouth sores, nosebleeds, sore throat and trouble swallowing.   Eyes: Negative for eye problems and icterus.  Respiratory: Negative for cough, hemoptysis, shortness of breath and wheezing.   Cardiovascular: Negative for chest pain and leg swelling.  Gastrointestinal: Negative for abdominal pain, constipation, diarrhea, nausea and vomiting.  Genitourinary: Negative for bladder incontinence, difficulty urinating, dysuria, frequency and hematuria.   Musculoskeletal: Negative for back pain, gait problem, neck pain and neck stiffness.  Skin: Negative for itching and rash.  Neurological: Negative for dizziness, extremity weakness, gait problem, headaches, light-headedness and seizures.  Hematological: Negative for adenopathy. Does not bruise/bleed  easily.  Psychiatric/Behavioral: Negative for confusion, depression and sleep disturbance. The patient is not nervous/anxious.     PHYSICAL EXAMINATION:  Last menstrual period 03/21/2008.  ECOG PERFORMANCE STATUS: {CHL ONC ECOG [id number]  Physical Exam  Constitutional: Oriented to person, place, and time and well-developed, well-nourished, and in no distress. No distress.  HENT:  Head: Normocephalic and atraumatic.  Mouth/Throat: Oropharynx is clear and moist. No oropharyngeal exudate.  Eyes: Conjunctivae are normal. Right eye exhibits no discharge. Left eye exhibits no discharge. No scleral icterus.  Neck: Normal range of motion. Neck supple.  Cardiovascular: Normal rate, regular rhythm, normal heart sounds and intact distal pulses.   Pulmonary/Chest: Effort normal and breath sounds normal. No respiratory distress. No wheezes. No rales.  Abdominal: Soft. Bowel sounds are normal. Exhibits no distension and no mass. There is no tenderness.  Musculoskeletal: Normal range of motion. Exhibits no edema.  Lymphadenopathy:    No cervical adenopathy.  Neurological: Alert and oriented to person, place, and time. Exhibits normal muscle tone. Gait normal. Coordination normal.  Skin: Skin is warm and dry. No rash noted. Not diaphoretic. No erythema. No pallor.  Psychiatric: Mood, memory and judgment normal.  Vitals reviewed.  LABORATORY DATA: Lab Results  Component Value Date   WBC 4.0 05/11/2018  HGB 9.2 (L) 05/11/2018   HCT 27.5 (L) 05/11/2018   MCV 91.7 05/11/2018   PLT 170 05/11/2018      Chemistry      Component Value Date/Time   NA 137 05/12/2018 1440   NA 138 09/24/2017 1600   K 3.0 (LL) 05/12/2018 1440   K 3.4 (L) 09/24/2017 1600   CL 101 05/12/2018 1440   CO2 25 05/12/2018 1440   CO2 25 09/24/2017 1600   BUN 6 05/12/2018 1440   BUN 9.5 09/24/2017 1600   CREATININE 0.63 05/12/2018 1440   CREATININE 0.8 09/24/2017 1600      Component Value Date/Time   CALCIUM 9.6  05/12/2018 1440   CALCIUM 9.0 09/24/2017 1600   ALKPHOS 137 (H) 05/12/2018 1440   ALKPHOS 92 09/24/2017 1600   AST 40 05/12/2018 1440   AST 31 09/24/2017 1600   ALT 28 05/12/2018 1440   ALT 24 09/24/2017 1600   BILITOT 0.9 05/12/2018 1440   BILITOT 0.40 09/24/2017 1600       RADIOGRAPHIC STUDIES:  No results found.   ASSESSMENT/PLAN:  No problem-specific Assessment & Plan notes found for this encounter.   No orders of the defined types were placed in this encounter.    Mikey Bussing, DNP, AGPCNP-BC, AOCNP 05/25/18

## 2018-05-25 NOTE — Telephone Encounter (Signed)
Pt cancelled appt. I gave the phone  Number to Central scheduling -she will call for CT appt.

## 2018-05-25 NOTE — Telephone Encounter (Signed)
LVM to keep appt today with [facility].

## 2018-05-26 DIAGNOSIS — C3490 Malignant neoplasm of unspecified part of unspecified bronchus or lung: Secondary | ICD-10-CM | POA: Diagnosis not present

## 2018-05-26 DIAGNOSIS — D649 Anemia, unspecified: Secondary | ICD-10-CM | POA: Diagnosis not present

## 2018-05-26 DIAGNOSIS — I482 Chronic atrial fibrillation: Secondary | ICD-10-CM | POA: Diagnosis not present

## 2018-05-26 DIAGNOSIS — G629 Polyneuropathy, unspecified: Secondary | ICD-10-CM | POA: Diagnosis not present

## 2018-05-26 DIAGNOSIS — Z8701 Personal history of pneumonia (recurrent): Secondary | ICD-10-CM | POA: Diagnosis not present

## 2018-05-27 ENCOUNTER — Other Ambulatory Visit: Payer: Self-pay | Admitting: Medical Oncology

## 2018-05-27 ENCOUNTER — Telehealth: Payer: Self-pay | Admitting: Medical Oncology

## 2018-05-27 ENCOUNTER — Other Ambulatory Visit: Payer: Self-pay | Admitting: Internal Medicine

## 2018-05-27 ENCOUNTER — Telehealth: Payer: Self-pay | Admitting: Oncology

## 2018-05-27 ENCOUNTER — Other Ambulatory Visit: Payer: Self-pay | Admitting: Oncology

## 2018-05-27 DIAGNOSIS — C3491 Malignant neoplasm of unspecified part of right bronchus or lung: Secondary | ICD-10-CM

## 2018-05-27 DIAGNOSIS — G609 Hereditary and idiopathic neuropathy, unspecified: Secondary | ICD-10-CM

## 2018-05-27 DIAGNOSIS — R112 Nausea with vomiting, unspecified: Secondary | ICD-10-CM

## 2018-05-27 MED ORDER — GABAPENTIN 300 MG PO CAPS: ORAL_CAPSULE | ORAL | 0 refills | Status: DC

## 2018-05-27 MED ORDER — ONDANSETRON 4 MG PO TBDP: 4.0000 mg | ORAL_TABLET | Freq: Three times a day (TID) | ORAL | 0 refills | Status: DC | PRN

## 2018-05-27 MED ORDER — GABAPENTIN 300 MG PO CAPS: ORAL_CAPSULE | ORAL | 0 refills | Status: AC

## 2018-05-27 MED ORDER — OXYCODONE HCL 5 MG PO TABS: 5.0000 mg | ORAL_TABLET | Freq: Four times a day (QID) | ORAL | 0 refills | Status: DC | PRN

## 2018-05-27 MED FILL — GABAPENTIN 300 MG CAPSULE: 300 | 30 days supply | Qty: 120 | Fill #0

## 2018-05-27 MED FILL — ONDANSETRON ODT 4 MG TABLET: 4 | 7 days supply | Qty: 20 | Fill #0

## 2018-05-27 MED FILL — oxyCODONE HCL 5 MG TABS: 5 | 15 days supply | Qty: 60 | Fill #0

## 2018-05-27 NOTE — Telephone Encounter (Signed)
Tried to reach regarding 9/9 I did leave a message

## 2018-05-27 NOTE — Telephone Encounter (Signed)
Needs oxycodone to [facility].

## 2018-05-30 ENCOUNTER — Inpatient Hospital Stay: Payer: Medicare Other

## 2018-05-30 ENCOUNTER — Telehealth: Payer: Self-pay | Admitting: Internal Medicine

## 2018-05-30 ENCOUNTER — Ambulatory Visit (HOSPITAL_COMMUNITY)
Admission: RE | Admit: 2018-05-30 | Discharge: 2018-05-30 | Disposition: A | Discharge status: Home / Self Care | Payer: Medicare Other | Source: Ambulatory Visit | Attending: Internal Medicine | Admitting: Internal Medicine

## 2018-05-30 ENCOUNTER — Encounter (HOSPITAL_COMMUNITY): Payer: Self-pay

## 2018-05-30 DIAGNOSIS — C3490 Malignant neoplasm of unspecified part of unspecified bronchus or lung: Secondary | ICD-10-CM

## 2018-05-30 DIAGNOSIS — J90 Pleural effusion, not elsewhere classified: Secondary | ICD-10-CM | POA: Diagnosis not present

## 2018-05-30 DIAGNOSIS — R222 Localized swelling, mass and lump, trunk: Secondary | ICD-10-CM | POA: Insufficient documentation

## 2018-05-30 DIAGNOSIS — I700 Atherosclerosis of aorta: Secondary | ICD-10-CM | POA: Diagnosis not present

## 2018-05-30 DIAGNOSIS — K7689 Other specified diseases of liver: Secondary | ICD-10-CM | POA: Diagnosis not present

## 2018-05-30 MED ORDER — IOHEXOL 300 MG/ML  SOLN
100.0000 mL | Freq: Once | INTRAMUSCULAR | Status: AC | PRN
  Administered 2018-05-30: 100 mL via INTRAVENOUS

## 2018-05-30 MED ORDER — HEPARIN SOD (PORK) LOCK FLUSH 100 UNIT/ML IV SOLN
INTRAVENOUS | Status: AC
  Filled 2018-05-30: qty 5

## 2018-05-30 MED ORDER — HEPARIN SOD (PORK) LOCK FLUSH 100 UNIT/ML IV SOLN
500.0000 [IU] | Freq: Once | INTRAVENOUS | Status: AC
  Administered 2018-05-30: 500 [IU] via INTRAVENOUS

## 2018-05-30 NOTE — Telephone Encounter (Signed)
Spoke with patient regarding appointments she is aware of date/time per 9/9 sch msg

## 2018-05-30 NOTE — Telephone Encounter (Signed)
[facility] calling patient unable to reach her.  She will get updated schedule at next visit per 9/6 sch msg

## 2018-05-31 ENCOUNTER — Encounter: Payer: Medicare Other | Admitting: Nutrition

## 2018-05-31 ENCOUNTER — Other Ambulatory Visit: Payer: Medicare Other

## 2018-05-31 ENCOUNTER — Ambulatory Visit: Payer: Medicare Other

## 2018-05-31 DIAGNOSIS — C3490 Malignant neoplasm of unspecified part of unspecified bronchus or lung: Secondary | ICD-10-CM | POA: Diagnosis not present

## 2018-05-31 DIAGNOSIS — Z8701 Personal history of pneumonia (recurrent): Secondary | ICD-10-CM | POA: Diagnosis not present

## 2018-05-31 DIAGNOSIS — G629 Polyneuropathy, unspecified: Secondary | ICD-10-CM | POA: Diagnosis not present

## 2018-05-31 DIAGNOSIS — I482 Chronic atrial fibrillation: Secondary | ICD-10-CM | POA: Diagnosis not present

## 2018-05-31 DIAGNOSIS — D649 Anemia, unspecified: Secondary | ICD-10-CM | POA: Diagnosis not present

## 2018-06-01 ENCOUNTER — Inpatient Hospital Stay: Payer: Medicare Other

## 2018-06-01 ENCOUNTER — Inpatient Hospital Stay: Payer: Medicare Other | Attending: Internal Medicine | Admitting: Oncology

## 2018-06-01 ENCOUNTER — Other Ambulatory Visit: Payer: Self-pay

## 2018-06-01 ENCOUNTER — Telehealth: Payer: Self-pay | Admitting: Oncology

## 2018-06-01 ENCOUNTER — Encounter: Payer: Self-pay | Admitting: Oncology

## 2018-06-01 VITALS — BP 104/73 | HR 127 | Temp 98.2°F | Resp 18 | Ht 68.0 in | Wt 168.0 lb

## 2018-06-01 DIAGNOSIS — Z7189 Other specified counseling: Secondary | ICD-10-CM

## 2018-06-01 DIAGNOSIS — G629 Polyneuropathy, unspecified: Secondary | ICD-10-CM | POA: Diagnosis not present

## 2018-06-01 DIAGNOSIS — Z452 Encounter for adjustment and management of vascular access device: Secondary | ICD-10-CM | POA: Diagnosis not present

## 2018-06-01 DIAGNOSIS — E860 Dehydration: Secondary | ICD-10-CM

## 2018-06-01 DIAGNOSIS — C3481 Malignant neoplasm of overlapping sites of right bronchus and lung: Secondary | ICD-10-CM

## 2018-06-01 DIAGNOSIS — Z5111 Encounter for antineoplastic chemotherapy: Secondary | ICD-10-CM

## 2018-06-01 DIAGNOSIS — C3491 Malignant neoplasm of unspecified part of right bronchus or lung: Secondary | ICD-10-CM

## 2018-06-01 LAB — CBC WITH DIFFERENTIAL (CANCER CENTER ONLY)
BASOS ABS: 0 10*3/uL (ref 0.0–0.1)
Basophils Relative: 0 %
EOS PCT: 1 %
Eosinophils Absolute: 0 10*3/uL (ref 0.0–0.5)
HCT: 25 % — ABNORMAL LOW (ref 34.8–46.6)
Hemoglobin: 8.2 g/dL — ABNORMAL LOW (ref 11.6–15.9)
LYMPHS PCT: 12 %
Lymphs Abs: 0.6 10*3/uL — ABNORMAL LOW (ref 0.9–3.3)
MCH: 31.6 pg (ref 25.1–34.0)
MCHC: 32.7 g/dL (ref 31.5–36.0)
MCV: 96.8 fL (ref 79.5–101.0)
MONO ABS: 0.9 10*3/uL (ref 0.1–0.9)
Monocytes Relative: 20 %
NEUTROS ABS: 3.2 10*3/uL (ref 1.5–6.5)
Neutrophils Relative %: 67 %
PLATELETS: 288 10*3/uL (ref 145–400)
RBC: 2.58 MIL/uL — AB (ref 3.70–5.45)
RDW: 24.3 % — AB (ref 11.2–14.5)
WBC Count: 4.8 10*3/uL (ref 3.9–10.3)

## 2018-06-01 LAB — CMP (CANCER CENTER ONLY)
ALK PHOS: 103 U/L (ref 38–126)
ALT: 7 U/L (ref 0–44)
AST: 26 U/L (ref 15–41)
Albumin: 2.7 g/dL — ABNORMAL LOW (ref 3.5–5.0)
Anion gap: 11 (ref 5–15)
BUN: 3 mg/dL — AB (ref 6–20)
CALCIUM: 9.6 mg/dL (ref 8.9–10.3)
CO2: 25 mmol/L (ref 22–32)
CREATININE: 0.73 mg/dL (ref 0.44–1.00)
Chloride: 102 mmol/L (ref 98–111)
GFR, Est AFR Am: >60 mL/min (ref >60)
GFR, Estimated: >60 mL/min (ref >60)
Glucose, Bld: 105 mg/dL — ABNORMAL HIGH (ref 70–99)
Potassium: 3.6 mmol/L (ref 3.5–5.1)
Sodium: 138 mmol/L (ref 135–145)
TOTAL PROTEIN: 8 g/dL (ref 6.5–8.1)
Total Bilirubin: 0.6 mg/dL (ref 0.3–1.2)

## 2018-06-01 MED ORDER — SODIUM CHLORIDE 0.9% FLUSH
10.0000 mL | INTRAVENOUS | Status: DC | PRN
  Administered 2018-06-01: 10 mL
  Filled 2018-06-01: qty 10

## 2018-06-01 MED ORDER — HEPARIN SOD (PORK) LOCK FLUSH 100 UNIT/ML IV SOLN
500.0000 [IU] | Freq: Once | INTRAVENOUS | Status: AC | PRN
  Administered 2018-06-01: 500 [IU]
  Filled 2018-06-01: qty 5

## 2018-06-01 NOTE — Patient Instructions (Signed)
Docetaxel injection What is this medicine? DOCETAXEL (doe se TAX el) is a chemotherapy drug. It targets fast dividing cells, like cancer cells, and causes these cells to die. This medicine is used to treat many types of cancers like breast cancer, certain stomach cancers, head and neck cancer, lung cancer, and prostate cancer. This medicine may be used for other purposes; ask your health care provider or pharmacist if you have questions. COMMON BRAND NAME(S): Docefrez, Taxotere What should I tell my health care provider before I take this medicine? They need to know if you have any of these conditions: -infection (especially a virus infection such as chickenpox, cold sores, or herpes) -liver disease -low blood counts, like low white cell, platelet, or red cell counts -an unusual or allergic reaction to docetaxel, polysorbate 80, other chemotherapy agents, other medicines, foods, dyes, or preservatives -pregnant or trying to get pregnant -breast-feeding How should I use this medicine? This drug is given as an infusion into a vein. It is administered in a hospital or clinic by a specially trained health care professional. Talk to your pediatrician regarding the use of this medicine in children. Special care may be needed. Overdosage: If you think you have taken too much of this medicine contact a poison control center or emergency room at once. NOTE: This medicine is only for you. Do not share this medicine with others. What if I miss a dose? It is important not to miss your dose. Call your doctor or health care professional if you are unable to keep an appointment. What may interact with this medicine? -cyclosporine -erythromycin -ketoconazole -medicines to increase blood counts like filgrastim, pegfilgrastim, sargramostim -vaccines Talk to your doctor or health care professional before taking any of these medicines: -acetaminophen -aspirin -ibuprofen -ketoprofen -naproxen This list  may not describe all possible interactions. Give your health care provider a list of all the medicines, herbs, non-prescription drugs, or dietary supplements you use. Also tell them if you smoke, drink alcohol, or use illegal drugs. Some items may interact with your medicine. What should I watch for while using this medicine? Your condition will be monitored carefully while you are receiving this medicine. You will need important blood work done while you are taking this medicine. This drug may make you feel generally unwell. This is not uncommon, as chemotherapy can affect healthy cells as well as cancer cells. Report any side effects. Continue your course of treatment even though you feel ill unless your doctor tells you to stop. In some cases, you may be given additional medicines to help with side effects. Follow all directions for their use. Call your doctor or health care professional for advice if you get a fever, chills or sore throat, or other symptoms of a cold or flu. Do not treat yourself. This drug decreases your body's ability to fight infections. Try to avoid being around people who are sick. This medicine may increase your risk to bruise or bleed. Call your doctor or health care professional if you notice any unusual bleeding. This medicine may contain alcohol in the product. You may get drowsy or dizzy. Do not drive, use machinery, or do anything that needs mental alertness until you know how this medicine affects you. Do not stand or sit up quickly, especially if you are an older patient. This reduces the risk of dizzy or fainting spells. Avoid alcoholic drinks. Do not become pregnant while taking this medicine. Women should inform their doctor if they wish to become pregnant or   think they might be pregnant. There is a potential for serious side effects to an unborn child. Talk to your health care professional or pharmacist for more information. Do not breast-feed an infant while taking  this medicine. What side effects may I notice from receiving this medicine? Side effects that you should report to your doctor or health care professional as soon as possible: -allergic reactions like skin rash, itching or hives, swelling of the face, lips, or tongue -low blood counts - This drug may decrease the number of white blood cells, red blood cells and platelets. You may be at increased risk for infections and bleeding. -signs of infection - fever or chills, cough, sore throat, pain or difficulty passing urine -signs of decreased platelets or bleeding - bruising, pinpoint red spots on the skin, black, tarry stools, nosebleeds -signs of decreased red blood cells - unusually weak or tired, fainting spells, lightheadedness -breathing problems -fast or irregular heartbeat -low blood pressure -mouth sores -nausea and vomiting -pain, swelling, redness or irritation at the injection site -pain, tingling, numbness in the hands or feet -swelling of the ankle, feet, hands -weight gain Side effects that usually do not require medical attention (report to your doctor or health care professional if they continue or are bothersome): -bone pain -complete hair loss including hair on your head, underarms, pubic hair, eyebrows, and eyelashes -diarrhea -excessive tearing -changes in the color of fingernails -loosening of the fingernails -nausea -muscle pain -red flush to skin -sweating -weak or tired This list may not describe all possible side effects. Call your doctor for medical advice about side effects. You may report side effects to FDA at 1-800-FDA-1088. Where should I keep my medicine? This drug is given in a hospital or clinic and will not be stored at home. NOTE: This sheet is a summary. It may not cover all possible information. If you have questions about this medicine, talk to your doctor, pharmacist, or health care provider.  2018 Elsevier/Gold Standard (2015-10-10  12:32:56)  Ramucirumab injection What is this medicine? RAMUCIRUMAB (ra mue SIR ue mab) is a monoclonal antibody. It is used to treat stomach cancer, colorectal cancer, or lung cancer. This medicine may be used for other purposes; ask your health care provider or pharmacist if you have questions. COMMON BRAND NAME(S): Cyramza What should I tell my health care provider before I take this medicine? They need to know if you have any of these conditions: -bleeding disorders -blood clots -heart disease, including heart failure, heart attack, or chest pain (angina) -high blood pressure -infection (especially a virus infection such as chickenpox, cold sores, or herpes) -protein in your urine -recent surgery -stroke -an unusual or allergic reaction to ramucirumab, other medicines, foods, dyes, or preservatives -pregnant or trying to get pregnant -breast-feeding How should I use this medicine? This medicine is for infusion into a vein. It is given by a health care professional in a hospital or clinic setting. Talk to your pediatrician regarding the use of this medicine in children. Special care may be needed. Overdosage: If you think you have taken too much of this medicine contact a poison control center or emergency room at once. NOTE: This medicine is only for you. Do not share this medicine with others. What if I miss a dose? It is important not to miss your dose. Call your doctor or health care professional if you are unable to keep an appointment. What may interact with this medicine? Interactions have not been studied. This list may  not describe all possible interactions. Give your health care provider a list of all the medicines, herbs, non-prescription drugs, or dietary supplements you use. Also tell them if you smoke, drink alcohol, or use illegal drugs. Some items may interact with your medicine. What should I watch for while using this medicine? Your condition will be monitored  carefully while you are receiving this medicine. You will need to to check your blood pressure and have your blood and urine tested while you are taking this medicine. Your condition will be monitored carefully while you are receiving this medicine. This medicine may increase your risk to bruise or bleed. Call your doctor or health care professional if you notice any unusual bleeding. This medicine may rarely cause 'gastrointestinal perforation' (holes in the stomach, intestines or colon), a serious side effect requiring surgery to repair. This medicine should be started at least 28 days following major surgery and the site of the surgery should be totally healed. Check with your doctor before scheduling dental work or surgery while you are receiving this treatment. Talk to your doctor if you have recently had surgery or if you have a wound that has not healed. Do not become pregnant while taking this medicine or for 3 months after stopping it. Women should inform their doctor if they wish to become pregnant or think they might be pregnant. There is a potential for serious side effects to an unborn child. Talk to your health care professional or pharmacist for more information. What side effects may I notice from receiving this medicine? Side effects that you should report to your doctor or health care professional as soon as possible: -allergic reactions like skin rash, itching or hives, breathing problems, swelling of the face, lips, or tongue -signs of infection - fever or chills, cough, sore throat -chest pain or chest tightness -confusion -dizziness -feeling faint or lightheaded, falls -severe abdominal pain -severe nausea, vomiting -signs and symptoms of bleeding such as bloody or black, tarry stools; red or dark-brown urine; spitting up blood or brown material that looks like coffee grounds; red spots on the skin; unusual bruising or bleeding from the eye, gums, or nose -signs and symptoms of  a blood clot such as breathing problems; changes in vision; chest pain; severe, sudden headache; pain, swelling, warmth in the leg; trouble speaking; sudden numbness or weakness of the face, arm or leg -symptoms of a stroke: change in mental awareness, inability to talk or move one side of the body -trouble walking, dizziness, loss of balance or coordination Side effects that usually do not require medical attention (report to your doctor or health care professional if they continue or are bothersome): -cold, clammy skin -constipation -diarrhea -headache -nausea, vomiting -stomach pain -unusually slow heartbeat -unusually weak or tired This list may not describe all possible side effects. Call your doctor for medical advice about side effects. You may report side effects to FDA at 1-800-FDA-1088. Where should I keep my medicine? This drug is given in a hospital or clinic and will not be stored at home. NOTE: This sheet is a summary. It may not cover all possible information. If you have questions about this medicine, talk to your doctor, pharmacist, or health care provider.  2018 Elsevier/Gold Standard (2015-10-10 08:20:29)  Vinorelbine injection What is this medicine? VINORELBINE (vi NOR el been) is a chemotherapy drug. It targets fast dividing cells, like cancer cells, and causes these cells to die. This medicine is used to treat cancer, like lung cancer. This  medicine may be used for other purposes; ask your health care provider or pharmacist if you have questions. COMMON BRAND NAME(S): Navelbine What should I tell my health care provider before I take this medicine? They need to know if you have any of these conditions: -blood disorders -infection (especially chickenpox and herpes) -liver disease -lung disease -nervous system disease -previous or current radiation therapy -an unusual or allergic reaction to vinorelbine, other chemotherapy agents, other medicines, foods, dyes, or  preservatives -pregnant or trying to get pregnant -breast-feeding How should I use this medicine? This drug is given as an infusion into a vein. It is administered in a hospital or clinic by a specially trained health care professional. If you have pain, swelling, burning or any unusual feeling around the site of your injection, tell your health care professional right away. Talk to your pediatrician regarding the use of this medicine in children. Special care may be needed. Overdosage: If you think you have taken too much of this medicine contact a poison control center or emergency room at once. NOTE: This medicine is only for you. Do not share this medicine with others. What if I miss a dose? It is important not to miss your dose. Call your doctor or health care professional if you are unable to keep an appointment. What may interact with this medicine? Do not take this medicine with any of the following medications: -itraconazole -voriconazole This medicine may also interact with the following medications: -cyclosporine -erythromycin -fluconazole -ketoconazole -medicines for HIV like delavirdine, efavirenz, nevirapine -medicines for seizures like ethotoin, fosphenotoin, phenytoin -medicines to increase blood counts like filgrastim, pegfilgrastim, sargramostim -other chemotherapy drugs like cisplatin, mitomycin, paclitaxel -vaccines Talk to your doctor or health care professional before taking any of these medicines: -acetaminophen -aspirin -ibuprofen -ketoprofen -naproxen This list may not describe all possible interactions. Give your health care provider a list of all the medicines, herbs, non-prescription drugs, or dietary supplements you use. Also tell them if you smoke, drink alcohol, or use illegal drugs. Some items may interact with your medicine. What should I watch for while using this medicine? Your condition will be monitored carefully while you are receiving this  medicine. You will need important blood work done while you are taking this medicine. This drug may make you feel generally unwell. This is not uncommon, as chemotherapy can affect healthy cells as well as cancer cells. Report any side effects. Continue your course of treatment even though you feel ill unless your doctor tells you to stop. In some cases, you may be given additional medicines to help with side effects. Follow all directions for their use. Call your doctor or health care professional for advice if you get a fever, chills or sore throat, or other symptoms of a cold or flu. Do not treat yourself. This drug decreases your body's ability to fight infections. Try to avoid being around people who are sick. This medicine may increase your risk to bruise or bleed. Call your doctor or health care professional if you notice any unusual bleeding. Be careful brushing and flossing your teeth or using a toothpick because you may get an infection or bleed more easily. If you have any dental work done, tell your dentist you are receiving this medicine. Avoid taking products that contain aspirin, acetaminophen, ibuprofen, naproxen, or ketoprofen unless instructed by your doctor. These medicines may hide a fever. Do not become pregnant while taking this medicine. Women should inform their doctor if they wish to become pregnant  or think they might be pregnant. There is a potential for serious side effects to an unborn child. Talk to your health care professional or pharmacist for more information. Do not breast-feed an infant while taking this medicine. Men must use a latex condom during sexual contact with a woman while taking this medicine and for 3 months after you stop taking this medicine. A latex condom is needed even if you have had a vasectomy. Contact your doctor right away if your partner becomes pregnant. Do not donate sperm while taking this medicine and for 3 months after you stop taking this  medicine. Men should inform their doctors if they wish to father a child. This medicine may lower sperm counts. What side effects may I notice from receiving this medicine? Side effects that you should report to your doctor or health care professional as soon as possible: -allergic reactions like skin rash, itching or hives, swelling of the face, lips, or tongue -low blood counts - This drug may decrease the number of white blood cells, red blood cells and platelets. You may be at increased risk for infections and bleeding. -signs of infection - fever or chills, cough, sore throat, pain or difficulty passing urine -signs of decreased platelets or bleeding - bruising, pinpoint red spots on the skin, black, tarry stools, nosebleeds -signs of decreased red blood cells - unusually weak or tired, fainting spells, lightheadedness -breathing problems -chest pain -constipation -cough -mouth sores -nausea and vomiting -pain, swelling, redness or irritation at the injection site -pain, tingling, numbness in the hands or feet -stomach pain -trouble passing urine or change in the amount of urine Side effects that usually do not require medical attention (report to your doctor or health care professional if they continue or are bothersome): -diarrhea -hair loss -jaw pain -loss of appetite This list may not describe all possible side effects. Call your doctor for medical advice about side effects. You may report side effects to FDA at 1-800-FDA-1088. Where should I keep my medicine? This drug is given in a hospital or clinic and will not be stored at home. NOTE: This sheet is a summary. It may not cover all possible information. If you have questions about this medicine, talk to your doctor, pharmacist, or health care provider.  2018 Elsevier/Gold Standard (2014-03-07 11:37:59)

## 2018-06-01 NOTE — Progress Notes (Signed)
[employer] OFFICE PROGRESS NOTE  Theresa Eagles, PA-C [place] [id number]  DIAGNOSIS:Metastatic non-small cell lung cancer initially diagnosed as Stage IIIA (T2b, N2, M0) non-small cell lung cancer, invasive poorly differentiated squamous cell carcinoma diagnosed in November 2016 and presented with large right hilar mass with collapse of the right middle lobe and extension into the right upper lobe and right lower lobe along the hilum with right paratracheal and subcarinal lymphadenopathy. She presented in July 2018 with metastatic intramuscular mass in the right posterior lateral chest wall, with biopsy confirmed squamous cell carcinoma. PDL1 Expression: 50%.  PRIOR THERAPY: 1) Concurrent chemoradiation with weekly carboplatin for AUC of 2 and paclitaxel 45 MG/M2. First dose 08/19/2015. Status post 5 cycles. Last cycle was given 10/07/2015 with significant response. 2) Consolidation chemotherapy with carboplatin for AUC of 5 and paclitaxel 175 MG/M2 every 3 weeks with Neulasta support. First dose 11/18/2015. Status post 3 cycles. 3) palliative radiotherapy to the intramuscular mass in the right posterior lateral chest wall under the care of Dr. Tammi Klippel. 4) Ketruda 200 mg IV every 3 weeks. First dose 05/12/2017. Status post 3 cycles. This was discontinued secondary to disease progression. 5)Systemic chemotherapy with carboplatin for AUC of 5 and paclitaxel 175 mg/M2 every 3 weeks with Neulasta support. First dose August 26, 2017. Status post6cycles.  CURRENT THERAPY:Gemzar 1000 mg/m day 1 and day 8 of 21-day cycle. First dose 01/12/2018.Status post6cycles.  INTERVAL HISTORY: Theresa [name] 57 y.o. female returns for a routine follow-up visit accompanied by her son.  The patient reports that she is feeling fine today and has no specific complaints that for mild fatigue and intermittent back pain.  She uses oxycodone which is effective.  She denies  fevers and chills.  Denies chest pain, shortness of breath, cough, hemoptysis.  Denies nausea, vomiting, constipation, diarrhea.  She reports that her appetite is fair but her weight has remained stable since her last visit.  She reports ongoing peripheral neuropathy to her hands and feet which may be slightly improved since the last visit.  The patient had a restaging CT scan and is here to discuss the results.  MEDICAL HISTORY: Past Medical History:  Diagnosis Date  . Cancer ([place])    5 years ago - cervical cancer  . Encounter for antineoplastic chemotherapy 08/27/2015  . History of radiation therapy 08/19/2015 - 10/07/2015   Site/dose:   The patient's primary tumor and involved lymph nodes were treated to 66 Gy in 30 fractions.  . Hypokalemia 09/30/2015  . Non-small cell carcinoma of lung, stage 3 ([place]) 08/08/2015  . Paroxysmal atrial flutter ([place]) 09/16/2015  . Pneumonia     ALLERGIES:  has No Known Allergies.  MEDICATIONS:  Current Outpatient Medications  Medication Sig Dispense Refill  . gabapentin (NEURONTIN) 300 MG capsule Take 1 capsule in the morning, 1 capsule in the afternoon, and 2 capsules at bedtime. 120 capsule 0  . ibuprofen (ADVIL,MOTRIN) 800 MG tablet Take 1 tablet by mouth every 8 (eight) hours as needed.    . lidocaine (LIDODERM) 5 % Place 1 patch onto the skin daily. Remove & Discard patch within 12 hours or as directed by MD 30 patch 0  . lidocaine-prilocaine (EMLA) cream Apply 1 application topically as needed. 30 g 2  . LORazepam (ATIVAN) 0.5 MG tablet 0.5 mg sublingual p.o. every 6 hours as needed for nausea and/or vomiting. 40 tablet 0  . metoprolol tartrate (LOPRESSOR) 25 MG tablet Take 0.5 tablets (12.5 mg total)  by mouth 2 (two) times daily. 90 tablet 1  . mirtazapine (REMERON) 30 MG tablet Take 1 tablet (30 mg total) by mouth at bedtime. 30 tablet 1  . Multiple Vitamin (MULTIVITAMIN) capsule Take 1 capsule daily by mouth. 30 capsule 2  . ondansetron (ZOFRAN  ODT) 4 MG disintegrating tablet Take 1 tablet (4 mg total) by mouth every 8 (eight) hours as needed for nausea or vomiting. 20 tablet 0  . ondansetron (ZOFRAN) 8 MG tablet Take 1 tablet (8 mg total) by mouth every 8 (eight) hours as needed for nausea or vomiting. 20 tablet 3  . oxyCODONE (OXY IR/ROXICODONE) 5 MG immediate release tablet Take 1 tablet (5 mg total) by mouth every 6 (six) hours as needed for severe pain. 60 tablet 0  . potassium chloride SA (K-DUR,KLOR-CON) 20 MEQ tablet Take 2 tabs every morning and 2 tab every evening. 120 tablet 0  . prochlorperazine (COMPAZINE) 10 MG tablet Take 1 tablet (10 mg total) by mouth every 6 (six) hours as needed for nausea or vomiting. 30 tablet 1   Current Facility-Administered Medications  Medication Dose Route Frequency Provider Last Rate Last Dose  . sodium chloride flush (NS) 0.9 % injection 10 mL  10 mL Intracatheter PRN Curt Bears, MD   10 mL at 06/01/18 1347   Facility-Administered Medications Ordered in Other Visits  Medication Dose Route Frequency Provider Last Rate Last Dose  . 0.9 %  sodium chloride infusion   Intravenous Continuous [name], NP      . 0.9 %  sodium chloride infusion   Intravenous Continuous Curt Bears, MD   Stopped at 05/31/17 1714  . ondansetron (ZOFRAN) injection 8 mg  8 mg Intravenous Once Curt Bears, MD        SURGICAL HISTORY:  Past Surgical History:  Procedure Laterality Date  . IR FLUORO GUIDE PORT INSERTION RIGHT  05/21/2017  . IR US GUIDE VASC ACCESS RIGHT  05/21/2017  . TUBAL LIGATION    . VIDEO BRONCHOSCOPY WITH ENDOBRONCHIAL ULTRASOUND N/A 08/02/2015   Procedure: VIDEO BRONCHOSCOPY WITH ENDOBRONCHIAL ULTRASOUND;  Surgeon: [name], MD;  Location: [facility];  Service: Thoracic;  Laterality: N/A;  . WISDOM TOOTH EXTRACTION      REVIEW OF SYSTEMS:   Review of Systems  Constitutional: Negative for appetite change, chills, fever.  Positive for fatigue. HENT:   Negative  for mouth sores, nosebleeds, sore throat and trouble swallowing.   Eyes: Negative for eye problems and icterus.  Respiratory: Negative for cough, hemoptysis, shortness of breath and wheezing.   Cardiovascular: Negative for chest pain and leg swelling.  Gastrointestinal: Negative for abdominal pain, constipation, diarrhea, nausea and vomiting.  Genitourinary: Negative for bladder incontinence, difficulty urinating, dysuria, frequency and hematuria.   Musculoskeletal: Negative for neck pain and neck stiffness.  Positive for intermittent back pain. Skin: Negative for itching and rash.  Neurological: Negative for dizziness, extremity weakness, headaches, light-headedness and seizures.  Positive for peripheral neuropathy to her hands and feet. Hematological: Negative for adenopathy. Does not bruise/bleed easily.  Psychiatric/Behavioral: Negative for confusion, depression and sleep disturbance. The patient is not nervous/anxious.     PHYSICAL EXAMINATION:  Blood pressure 104/73, pulse (!) 127, temperature 98.2 F (36.8 C), temperature source Oral, resp. rate 18, height _0  (1.727 m), weight 168 lb (76.2 kg), last menstrual period 03/21/2008, SpO2 98 %.  ECOG PERFORMANCE STATUS: 1 - Symptomatic but completely ambulatory  Physical Exam  Constitutional: Oriented to person, place, and time and well-developed,  well-nourished, and in no distress. No distress.  HENT:  Head: Normocephalic and atraumatic.  Mouth/Throat: Oropharynx is clear and moist. No oropharyngeal exudate.  Eyes: Conjunctivae are normal. Right eye exhibits no discharge. Left eye exhibits no discharge. No scleral icterus.  Neck: Normal range of motion. Neck supple.  Cardiovascular: Tachycardia, regular rhythm, normal heart sounds and intact distal pulses.   Pulmonary/Chest: Effort normal. No respiratory distress. No wheezes. No rales.  Sounds to the right mid lung down to the base. Abdominal: Soft. Bowel sounds are normal. Exhibits  no distension and no mass. There is no tenderness.  Musculoskeletal: Normal range of motion. Exhibits no edema.  Lymphadenopathy:    No cervical adenopathy.  Neurological: Alert and oriented to person, place, and time. Exhibits normal muscle tone. Coordination normal.  Skin: Skin is warm and dry. No rash noted. Not diaphoretic. No erythema. No pallor.  Psychiatric: Mood, memory and judgment normal.  Vitals reviewed.  LABORATORY DATA: Lab Results  Component Value Date   WBC 4.8 06/01/2018   HGB 8.2 (L) 06/01/2018   HCT 25.0 (L) 06/01/2018   MCV 96.8 06/01/2018   PLT 288 06/01/2018      Chemistry      Component Value Date/Time   NA 138 06/01/2018 1224   NA 138 09/24/2017 1600   K 3.6 06/01/2018 1224   K 3.4 (L) 09/24/2017 1600   CL 102 06/01/2018 1224   CO2 25 06/01/2018 1224   CO2 25 09/24/2017 1600   BUN 3 (L) 06/01/2018 1224   BUN 9.5 09/24/2017 1600   CREATININE 0.73 06/01/2018 1224   CREATININE 0.8 09/24/2017 1600      Component Value Date/Time   CALCIUM 9.6 06/01/2018 1224   CALCIUM 9.0 09/24/2017 1600   ALKPHOS 103 06/01/2018 1224   ALKPHOS 92 09/24/2017 1600   AST 26 06/01/2018 1224   AST 31 09/24/2017 1600   ALT 7 06/01/2018 1224   ALT 24 09/24/2017 1600   BILITOT 0.6 06/01/2018 1224   BILITOT 0.40 09/24/2017 1600       RADIOGRAPHIC STUDIES:  Ct Chest W Contrast  Result Date: 05/30/2018 CLINICAL DATA:  Metastatic non-small cell lung cancer restaging EXAM: CT CHEST, ABDOMEN, AND PELVIS WITH CONTRAST TECHNIQUE: Multidetector CT imaging of the chest, abdomen and pelvis was performed following the standard protocol during bolus administration of intravenous contrast. CONTRAST:  180m OMNIPAQUE IOHEXOL 300 MG/ML  SOLN COMPARISON:  03/21/2018 FINDINGS: CT CHEST FINDINGS Cardiovascular: Right Port-A-Cath tip: SVC. Aberrant right subclavian artery passes behind the esophagus. Diminutive right pulmonary artery likely relates to vague vasoconstriction caused by  hypoxia. Mediastinum/Nodes: Progressive axillary adenopathy. A 2.0 cm in short axis right axillary lymph node on image 31/2 previously measured 1.0 cm in short axis. Lungs/Pleura: The clustered right apical masses are enlarging. The more cephalad of these measures 3.8 by 2.9 cm on image 9/2, formerly 2.8 by 2.2 cm. There is associated nodularity along the apical pleura which is increased. Indistinct right hilar adenopathy is difficult to distinguish from tumor or radiation therapy related fibrosis/pneumonitis around the hilum. notable perihilar airspace opacity along with some left upper paramediastinal airspace opacity resembling the prior exam. Some of this includes volume loss based on the clustering of the bronchi. Continued loculated right pleural effusion is overall moderate to large in size and roughly similar to the prior exam, with the right chest wall tumor extending into the pleural space posteriorly as shown on image 40/2 Musculoskeletal: Right chest wall metastatic masses. One of these lesions is  along the right latissimus dorsi and right subscapularis muscle and measures 8.1 by 7.2 cm on image 28/2, formerly 5.4 by 3.6 cm. The large posterior chest wall lesion measures approximately 13.7 by 4.9 cm, mildly increased from prior, and appears to extend into the pleural space in the eighth and ninth intercostal spaces. This mass is associated with a moth-eaten appearance of the right ninth and tenth ribs, and is eroding the tip of the right transverse process of the T9 vertebra. This tumor extends into the right paravertebral region but I do not show at as invading the spinal canal at this time. There is some indistinct sclerosis and mild heterogeneity inferiorly in the T9 vertebral body particularly eccentric to the right. This could be from radiation therapy related findings or metastatic disease. CT ABDOMEN PELVIS FINDINGS Hepatobiliary: Stable hypodense lesions scattered in the liver, mostly fluid  density, no appreciable change in size. Borderline wall thickening in the gallbladder. Pancreas: Unremarkable Spleen: Unremarkable Adrenals/Urinary Tract: Unremarkable Stomach/Bowel: Prominent stool throughout the colon favors constipation. Vascular/Lymphatic: Aortoiliac atherosclerotic vascular disease. Reproductive: 4.9 by 2.7 cm right adnexal lesion with large fatty component favoring dermoid. Otherwise unremarkable. Other: No supplemental non-categorized findings. Musculoskeletal: No findings of osseous metastatic disease. IMPRESSION: 1. Progressive malignancy with enlarging right apical clustered masses and large and enlarging right chest wall masss. 2. Large loculated right pleural effusion with tumor along the pleural space continuous with the larger of the 2 dominant chest wall masses. This larger mass erodes the right ninth and tenth ribs and the tip of the right transverse process of the T9 vertebra. Additional mixed sclerosis and lucency in the T9 vertebral body could be from radiation therapy or tumor extension. 3. A right axillary lymph node has enlarged from 1.0 cm to current measurement of 2.0 cm in short axis. 4. No findings of metastatic disease to the abdomen/pelvis or additional skeletal involvement. 5. Other imaging findings of potential clinical significance: Aortic Atherosclerosis (ICD10-I70.0). Aberrant right subclavian artery. Fluid density lesions in the liver, probably cysts. Prominent stool throughout the colon favors constipation. Right ovarian dermoid. Electronically Signed   By: Van Clines M.D.   On: 05/30/2018 15:15   Ct Abdomen Pelvis W Contrast  Result Date: 05/30/2018 CLINICAL DATA:  Metastatic non-small cell lung cancer restaging EXAM: CT CHEST, ABDOMEN, AND PELVIS WITH CONTRAST TECHNIQUE: Multidetector CT imaging of the chest, abdomen and pelvis was performed following the standard protocol during bolus administration of intravenous contrast. CONTRAST:  159m OMNIPAQUE  IOHEXOL 300 MG/ML  SOLN COMPARISON:  03/21/2018 FINDINGS: CT CHEST FINDINGS Cardiovascular: Right Port-A-Cath tip: SVC. Aberrant right subclavian artery passes behind the esophagus. Diminutive right pulmonary artery likely relates to vague vasoconstriction caused by hypoxia. Mediastinum/Nodes: Progressive axillary adenopathy. A 2.0 cm in short axis right axillary lymph node on image 31/2 previously measured 1.0 cm in short axis. Lungs/Pleura: The clustered right apical masses are enlarging. The more cephalad of these measures 3.8 by 2.9 cm on image 9/2, formerly 2.8 by 2.2 cm. There is associated nodularity along the apical pleura which is increased. Indistinct right hilar adenopathy is difficult to distinguish from tumor or radiation therapy related fibrosis/pneumonitis around the hilum. notable perihilar airspace opacity along with some left upper paramediastinal airspace opacity resembling the prior exam. Some of this includes volume loss based on the clustering of the bronchi. Continued loculated right pleural effusion is overall moderate to large in size and roughly similar to the prior exam, with the right chest wall tumor extending into the pleural  space posteriorly as shown on image 40/2 Musculoskeletal: Right chest wall metastatic masses. One of these lesions is along the right latissimus dorsi and right subscapularis muscle and measures 8.1 by 7.2 cm on image 28/2, formerly 5.4 by 3.6 cm. The large posterior chest wall lesion measures approximately 13.7 by 4.9 cm, mildly increased from prior, and appears to extend into the pleural space in the eighth and ninth intercostal spaces. This mass is associated with a moth-eaten appearance of the right ninth and tenth ribs, and is eroding the tip of the right transverse process of the T9 vertebra. This tumor extends into the right paravertebral region but I do not show at as invading the spinal canal at this time. There is some indistinct sclerosis and mild  heterogeneity inferiorly in the T9 vertebral body particularly eccentric to the right. This could be from radiation therapy related findings or metastatic disease. CT ABDOMEN PELVIS FINDINGS Hepatobiliary: Stable hypodense lesions scattered in the liver, mostly fluid density, no appreciable change in size. Borderline wall thickening in the gallbladder. Pancreas: Unremarkable Spleen: Unremarkable Adrenals/Urinary Tract: Unremarkable Stomach/Bowel: Prominent stool throughout the colon favors constipation. Vascular/Lymphatic: Aortoiliac atherosclerotic vascular disease. Reproductive: 4.9 by 2.7 cm right adnexal lesion with large fatty component favoring dermoid. Otherwise unremarkable. Other: No supplemental non-categorized findings. Musculoskeletal: No findings of osseous metastatic disease. IMPRESSION: 1. Progressive malignancy with enlarging right apical clustered masses and large and enlarging right chest wall masss. 2. Large loculated right pleural effusion with tumor along the pleural space continuous with the larger of the 2 dominant chest wall masses. This larger mass erodes the right ninth and tenth ribs and the tip of the right transverse process of the T9 vertebra. Additional mixed sclerosis and lucency in the T9 vertebral body could be from radiation therapy or tumor extension. 3. A right axillary lymph node has enlarged from 1.0 cm to current measurement of 2.0 cm in short axis. 4. No findings of metastatic disease to the abdomen/pelvis or additional skeletal involvement. 5. Other imaging findings of potential clinical significance: Aortic Atherosclerosis (ICD10-I70.0). Aberrant right subclavian artery. Fluid density lesions in the liver, probably cysts. Prominent stool throughout the colon favors constipation. Right ovarian dermoid. Electronically Signed   By: Van Clines M.D.   On: 05/30/2018 15:15     ASSESSMENT/PLAN:  Metastatic primary lung cancer, right [facility]) This is a very pleasant 57  year old African-American female with metastatic non-small cell lung cancer that was initially diagnosed as stage IIIAnon-small cell lung cancer, squamous cell carcinoma presented with large right hilar mass and mediastinal lymphadenopathy diagnosed in November 2016. She status post concurrent chemoradiation followed by consolidation chemotherapy. The patient was found recently to have intramuscular metastases and the right posterior chest wall that was biopsy-proven to be squamous cell carcinoma with PDL 1 expression 50%. The patient was a started on treatment with immunotherapy with Keytruda (pembrolizumab) status post 3 cycles. Unfortunately imaging studies after the treatment with Keytruda showed evidence for disease progression. The patient is currently undergoing treatment with carboplatin for AUC of 5 and paclitaxel 175 mg/M2 status post6cycles.  This was discontinued secondary to disease progression. The patient was started on systemic chemotherapy with single agent gemcitabine 1000 mg/M2 on days 1 and 8 every 3 weeks status post 6 cycles and has been tolerating this treatment well.  The patient had a restaging CT scan and is here to discuss the results.  The patient was seen with Dr. Julien Nordmann.  CT scan results were discussed with the patient  and her son which showed evidence for disease progression.  Treatment options were outlined for the patient today including systemic treatment with docetaxel and Cyramza versus single agent Navelbine versus a referral to hospice/palliative care.  Discussed with the patient that the treatment with docetaxel and Cyramza may be difficult for her due to side effects including but not limited to peripheral neuropathy, alopecia, and nausea and vomiting.  We discussed that Navelbine may be better tolerated, but may not be effective in treating her cancer.  She is not sure how she would like to proceed at this time.  She plans to go home and think about her options and  let us know what she decides.  If she would like to proceed with chemotherapy, we will get her rescheduled here in our office.  If she would like referral to hospice and palliative care will be happy to make this and she will follow-up with [place] on a as needed basis.  For pain management she will continue with her current pain medications with OxyIR as well as gabapentin.  The patient was advised to call immediately if she has any concerning symptoms in the interval. The patient voices understanding of current disease status and treatment options and is in agreement with the current care plan.  All questions were answered. The patient knows to call the clinic with any problems, questions or concerns. We can certainly see the patient much sooner if necessary.   No orders of the defined types were placed in this encounter.    Mikey Bussing, DNP, AGPCNP-BC, AOCNP 06/01/18   ADDENDUM: Hematology/Oncology Attending: I had a face-to-face encounter with the patient today.  I recommended her care plan.  This is a very pleasant 57 years old African-American female with metastatic squamous cell carcinoma of the lung.  She is status post several chemotherapy regimens as well as immunotherapy with Keytruda discontinued secondary to disease progression.  She was recently treated with single agent gemcitabine on days 1 and 8 every 3 weeks status post 6 cycles.  She has a rough time tolerating this treatment with significant pancytopenia and fatigue.  The patient mentioned that she is feeling fine today but definitely she has a lot of pain on the back from the metastatic lesion in the upper back.  She is currently on pain medication. She had repeat CT scan of the chest, abdomen and pelvis performed recently.  Unfortunately the scan showed evidence for disease progression in the lung as well as the right upper back muscle lesion. I had a lengthy discussion with the patient today in the presence of her son about  her current disease status, prognosis and treatment options.  Unfortunately the patient failed several chemotherapy regimens in the past. I gave the patient the option of palliative care and hospice referral versus consideration of treatment with systemic chemotherapy with docetaxel and Cyramza but this could be complicated with significant neutropenia as well as peripheral neuropathy and nail changes in addition to the hair loss.  She is also given the option of treatment with single agent Eugene Garnet.  Every 2 weeks.  I discussed with the patient the adverse effect of these treatments.  She would like to have some time to think about her options before making a decision.  We will leave her follow-up appointment open for now until the patient makes a decision. We will continue her current pain medication. The patient was advised to call if she has any concerning symptoms in the interval.  Disclaimer:  This note was dictated with voice recognition software. Similar sounding words can inadvertently be transcribed and may be missed upon review. Eilleen Kempf, MD 06/01/18

## 2018-06-01 NOTE — Assessment & Plan Note (Addendum)
This is a very pleasant 57 year old African-American female with metastatic non-small cell lung cancer that was initially diagnosed as stage IIIAnon-small cell lung cancer, squamous cell carcinoma presented with large right hilar mass and mediastinal lymphadenopathy diagnosed in November 2016. She status post concurrent chemoradiation followed by consolidation chemotherapy. The patient was found recently to have intramuscular metastases and the right posterior chest wall that was biopsy-proven to be squamous cell carcinoma with PDL 1 expression 50%. The patient was a started on treatment with immunotherapy with Keytruda (pembrolizumab) status post 3 cycles. Unfortunately imaging studies after the treatment with Keytruda showed evidence for disease progression. The patient is currently undergoing treatment with carboplatin for AUC of 5 and paclitaxel 175 mg/M2 status post6cycles.  This was discontinued secondary to disease progression. The patient was started on systemic chemotherapy with single agent gemcitabine 1000 mg/M2 on days 1 and 8 every 3 weeks status post 6 cycles and has been tolerating this treatment well.  The patient had a restaging CT scan and is here to discuss the results.  The patient was seen with Dr. Julien Nordmann.  CT scan results were discussed with the patient and her son which showed evidence for disease progression.  Treatment options were outlined for the patient today including systemic treatment with docetaxel and Cyramza versus single agent Navelbine versus a referral to hospice/palliative care.  Discussed with the patient that the treatment with docetaxel and Cyramza may be difficult for her due to side effects including but not limited to peripheral neuropathy, alopecia, and nausea and vomiting.  We discussed that Navelbine may be better tolerated, but may not be effective in treating her cancer.  She is not sure how she would like to proceed at this time.  She plans to go home and  think about her options and let us know what she decides.  If she would like to proceed with chemotherapy, we will get her rescheduled here in our office.  If she would like referral to hospice and palliative care will be happy to make this and she will follow-up with [place] on a as needed basis.  For pain management she will continue with her current pain medications with OxyIR as well as gabapentin.  The patient was advised to call immediately if she has any concerning symptoms in the interval. The patient voices understanding of current disease status and treatment options and is in agreement with the current care plan.  All questions were answered. The patient knows to call the clinic with any problems, questions or concerns. We can certainly see the patient much sooner if necessary.

## 2018-06-01 NOTE — Telephone Encounter (Signed)
Per 9/11 los, cancel all current appts.

## 2018-06-07 ENCOUNTER — Telehealth: Payer: Self-pay | Admitting: Medical Oncology

## 2018-06-07 NOTE — Telephone Encounter (Addendum)
Pain control- Asking for long acting pain relief. Asking if Theresa [name] would consider Duragesic 50 mcg q 3 days ( she got back in 2018) . She is concerned with cost. At [facility] the cost is $3.40  for 10 patches.

## 2018-06-08 ENCOUNTER — Other Ambulatory Visit: Payer: Medicare Other

## 2018-06-08 ENCOUNTER — Other Ambulatory Visit: Payer: Self-pay | Admitting: Internal Medicine

## 2018-06-08 ENCOUNTER — Ambulatory Visit: Payer: Medicare Other

## 2018-06-08 MED ORDER — FENTANYL 50 MCG/HR TD PT72: 50.0000 ug | MEDICATED_PATCH | TRANSDERMAL | 0 refills | Status: DC

## 2018-06-08 MED FILL — fentaNYL 50 MCG/HR PT72: 50 | 30 days supply | Qty: 10 | Fill #0

## 2018-06-08 NOTE — Telephone Encounter (Signed)
OK. Done. Thanks

## 2018-06-13 ENCOUNTER — Other Ambulatory Visit: Payer: Self-pay | Admitting: *Deleted

## 2018-06-13 ENCOUNTER — Other Ambulatory Visit: Payer: Self-pay | Admitting: Internal Medicine

## 2018-06-13 DIAGNOSIS — C3491 Malignant neoplasm of unspecified part of right bronchus or lung: Secondary | ICD-10-CM

## 2018-06-13 DIAGNOSIS — R112 Nausea with vomiting, unspecified: Secondary | ICD-10-CM

## 2018-06-13 MED ORDER — ONDANSETRON 4 MG PO TBDP: 4.0000 mg | ORAL_TABLET | Freq: Three times a day (TID) | ORAL | 0 refills | Status: AC | PRN

## 2018-06-13 MED ORDER — OXYCODONE HCL 5 MG PO TABS: 5.0000 mg | ORAL_TABLET | Freq: Four times a day (QID) | ORAL | 0 refills | Status: DC | PRN

## 2018-06-13 MED FILL — oxyCODONE HCL 5 MG TABS: 5 | 15 days supply | Qty: 60 | Fill #0

## 2018-06-13 MED FILL — ONDANSETRON ODT 4 MG TABLET: 4 | 6 days supply | Qty: 20 | Fill #0

## 2018-06-15 ENCOUNTER — Telehealth: Payer: Self-pay | Admitting: *Deleted

## 2018-06-15 NOTE — Telephone Encounter (Signed)
Pt called [facility] regarding pain medication.  Verified with [facility] pt's medicaiton has been ready for pick up since Monday. Returned call to pt and [facility] with above information.

## 2018-06-16 ENCOUNTER — Other Ambulatory Visit: Payer: Medicare Other

## 2018-06-16 ENCOUNTER — Ambulatory Visit: Payer: Medicare Other

## 2018-06-16 ENCOUNTER — Ambulatory Visit: Payer: Medicare Other | Admitting: Oncology

## 2018-06-22 ENCOUNTER — Encounter: Payer: Medicare Other | Admitting: Nutrition

## 2018-06-22 ENCOUNTER — Other Ambulatory Visit: Payer: Medicare Other

## 2018-06-22 ENCOUNTER — Ambulatory Visit: Payer: Medicare Other

## 2018-06-22 ENCOUNTER — Ambulatory Visit: Payer: Medicare Other | Admitting: Oncology

## 2018-06-22 DIAGNOSIS — Z23 Encounter for immunization: Secondary | ICD-10-CM | POA: Diagnosis not present

## 2018-06-23 ENCOUNTER — Other Ambulatory Visit: Payer: Medicare Other

## 2018-06-23 ENCOUNTER — Ambulatory Visit: Payer: Medicare Other

## 2018-06-27 ENCOUNTER — Telehealth: Payer: Self-pay | Admitting: *Deleted

## 2018-06-27 DIAGNOSIS — C342 Malignant neoplasm of middle lobe, bronchus or lung: Secondary | ICD-10-CM

## 2018-06-27 NOTE — Telephone Encounter (Signed)
Pt called states " I need a refill on Fentanyl patches and I want to try the next treatment that Dr Julien Nordmann recommended." Reviewed with MD who advised pt will need to come in for office visit to discuss treatment options. Refill on Fentanyl patch due 10/18. Message to scheduling.

## 2018-06-28 ENCOUNTER — Telehealth: Payer: Self-pay | Admitting: Internal Medicine

## 2018-06-28 NOTE — Telephone Encounter (Signed)
Tried to reach regarding 10/24 left a message

## 2018-06-29 ENCOUNTER — Other Ambulatory Visit: Payer: Medicare Other

## 2018-06-29 ENCOUNTER — Ambulatory Visit: Payer: Medicare Other

## 2018-07-01 ENCOUNTER — Telehealth: Payer: Self-pay | Admitting: Medical Oncology

## 2018-07-01 ENCOUNTER — Other Ambulatory Visit: Payer: Self-pay | Admitting: Internal Medicine

## 2018-07-01 DIAGNOSIS — C3491 Malignant neoplasm of unspecified part of right bronchus or lung: Secondary | ICD-10-CM

## 2018-07-01 MED ORDER — OXYCODONE HCL 5 MG PO TABS: 5.0000 mg | ORAL_TABLET | Freq: Four times a day (QID) | ORAL | 0 refills | Status: DC | PRN

## 2018-07-01 MED ORDER — FENTANYL 50 MCG/HR TD PT72: 50.0000 ug | MEDICATED_PATCH | TRANSDERMAL | 0 refills | Status: DC

## 2018-07-01 MED FILL — oxyCODONE HCL 5 MG TABS: 5 | 15 days supply | Qty: 60 | Fill #0

## 2018-07-01 NOTE — Telephone Encounter (Signed)
A little bit early but I did it.

## 2018-07-07 ENCOUNTER — Ambulatory Visit: Payer: Medicare Other | Admitting: Internal Medicine

## 2018-07-07 ENCOUNTER — Other Ambulatory Visit: Payer: Medicare Other

## 2018-07-07 ENCOUNTER — Ambulatory Visit: Payer: Medicare Other

## 2018-07-14 ENCOUNTER — Ambulatory Visit: Payer: Medicare Other

## 2018-07-14 ENCOUNTER — Other Ambulatory Visit: Payer: Medicare Other

## 2018-07-14 ENCOUNTER — Inpatient Hospital Stay: Payer: Medicare Other | Admitting: Internal Medicine

## 2018-07-14 ENCOUNTER — Inpatient Hospital Stay: Payer: Medicare Other

## 2018-07-15 ENCOUNTER — Telehealth: Payer: Self-pay

## 2018-07-15 ENCOUNTER — Inpatient Hospital Stay (HOSPITAL_BASED_OUTPATIENT_CLINIC_OR_DEPARTMENT_OTHER): Payer: Medicare Other | Admitting: Medical

## 2018-07-15 ENCOUNTER — Inpatient Hospital Stay: Payer: Medicare Other

## 2018-07-15 ENCOUNTER — Other Ambulatory Visit: Payer: Self-pay

## 2018-07-15 ENCOUNTER — Inpatient Hospital Stay (HOSPITAL_COMMUNITY)
Admission: AD | Admit: 2018-07-15 | Discharge: 2018-07-20 | DRG: 948 | Disposition: A | Discharge status: Skilled Nursing Facility | Payer: Medicare Other | Source: Ambulatory Visit | Attending: Internal Medicine | Admitting: Internal Medicine

## 2018-07-15 ENCOUNTER — Encounter (HOSPITAL_COMMUNITY): Payer: Self-pay

## 2018-07-15 VITALS — BP 129/88 | HR 148 | Temp 98.3°F | Resp 20 | Ht 68.0 in

## 2018-07-15 DIAGNOSIS — T451X5A Adverse effect of antineoplastic and immunosuppressive drugs, initial encounter: Secondary | ICD-10-CM | POA: Diagnosis present

## 2018-07-15 DIAGNOSIS — R269 Unspecified abnormalities of gait and mobility: Secondary | ICD-10-CM | POA: Diagnosis present

## 2018-07-15 DIAGNOSIS — G620 Drug-induced polyneuropathy: Secondary | ICD-10-CM | POA: Diagnosis present

## 2018-07-15 DIAGNOSIS — Z79899 Other long term (current) drug therapy: Secondary | ICD-10-CM

## 2018-07-15 DIAGNOSIS — R52 Pain, unspecified: Secondary | ICD-10-CM | POA: Diagnosis present

## 2018-07-15 DIAGNOSIS — Z801 Family history of malignant neoplasm of trachea, bronchus and lung: Secondary | ICD-10-CM

## 2018-07-15 DIAGNOSIS — M549 Dorsalgia, unspecified: Secondary | ICD-10-CM | POA: Diagnosis present

## 2018-07-15 DIAGNOSIS — E876 Hypokalemia: Secondary | ICD-10-CM | POA: Diagnosis not present

## 2018-07-15 DIAGNOSIS — Z85118 Personal history of other malignant neoplasm of bronchus and lung: Secondary | ICD-10-CM

## 2018-07-15 DIAGNOSIS — D539 Nutritional anemia, unspecified: Secondary | ICD-10-CM | POA: Diagnosis present

## 2018-07-15 DIAGNOSIS — R112 Nausea with vomiting, unspecified: Secondary | ICD-10-CM

## 2018-07-15 DIAGNOSIS — R110 Nausea: Secondary | ICD-10-CM | POA: Diagnosis present

## 2018-07-15 DIAGNOSIS — C3490 Malignant neoplasm of unspecified part of unspecified bronchus or lung: Secondary | ICD-10-CM

## 2018-07-15 DIAGNOSIS — Z87891 Personal history of nicotine dependence: Secondary | ICD-10-CM

## 2018-07-15 DIAGNOSIS — G893 Neoplasm related pain (acute) (chronic): Secondary | ICD-10-CM | POA: Diagnosis not present

## 2018-07-15 DIAGNOSIS — R5381 Other malaise: Secondary | ICD-10-CM | POA: Diagnosis present

## 2018-07-15 DIAGNOSIS — Z5111 Encounter for antineoplastic chemotherapy: Secondary | ICD-10-CM

## 2018-07-15 DIAGNOSIS — C7989 Secondary malignant neoplasm of other specified sites: Secondary | ICD-10-CM | POA: Diagnosis present

## 2018-07-15 DIAGNOSIS — C3491 Malignant neoplasm of unspecified part of right bronchus or lung: Secondary | ICD-10-CM

## 2018-07-15 DIAGNOSIS — C3481 Malignant neoplasm of overlapping sites of right bronchus and lung: Secondary | ICD-10-CM | POA: Diagnosis not present

## 2018-07-15 DIAGNOSIS — T40605A Adverse effect of unspecified narcotics, initial encounter: Secondary | ICD-10-CM | POA: Diagnosis present

## 2018-07-15 DIAGNOSIS — Z923 Personal history of irradiation: Secondary | ICD-10-CM

## 2018-07-15 DIAGNOSIS — Z825 Family history of asthma and other chronic lower respiratory diseases: Secondary | ICD-10-CM

## 2018-07-15 DIAGNOSIS — Z8541 Personal history of malignant neoplasm of cervix uteri: Secondary | ICD-10-CM

## 2018-07-15 DIAGNOSIS — K5903 Drug induced constipation: Secondary | ICD-10-CM | POA: Diagnosis present

## 2018-07-15 DIAGNOSIS — Z8249 Family history of ischemic heart disease and other diseases of the circulatory system: Secondary | ICD-10-CM

## 2018-07-15 DIAGNOSIS — Z515 Encounter for palliative care: Secondary | ICD-10-CM

## 2018-07-15 DIAGNOSIS — G8311 Monoplegia of lower limb affecting right dominant side: Secondary | ICD-10-CM | POA: Diagnosis present

## 2018-07-15 DIAGNOSIS — I10 Essential (primary) hypertension: Secondary | ICD-10-CM | POA: Diagnosis present

## 2018-07-15 LAB — CMP (CANCER CENTER ONLY)
ALK PHOS: 80 U/L (ref 38–126)
ALT: 10 U/L (ref 0–44)
ANION GAP: 13 (ref 5–15)
AST: 49 U/L — ABNORMAL HIGH (ref 15–41)
Albumin: 2.9 g/dL — ABNORMAL LOW (ref 3.5–5.0)
BILIRUBIN TOTAL: 0.8 mg/dL (ref 0.3–1.2)
BUN: 6 mg/dL (ref 6–20)
CALCIUM: 9.8 mg/dL (ref 8.9–10.3)
CO2: 24 mmol/L (ref 22–32)
CREATININE: 0.64 mg/dL (ref 0.44–1.00)
Chloride: 99 mmol/L (ref 98–111)
Glucose, Bld: 117 mg/dL — ABNORMAL HIGH (ref 70–99)
Potassium: 3 mmol/L — CL (ref 3.5–5.1)
Sodium: 136 mmol/L (ref 135–145)
TOTAL PROTEIN: 8.3 g/dL — AB (ref 6.5–8.1)

## 2018-07-15 LAB — CBC WITH DIFFERENTIAL (CANCER CENTER ONLY)
ABS IMMATURE GRANULOCYTES: 0.01 10*3/uL (ref 0.00–0.07)
BASOS ABS: 0 10*3/uL (ref 0.0–0.1)
Basophils Relative: 0 %
EOS PCT: 0 %
Eosinophils Absolute: 0 10*3/uL (ref 0.0–0.5)
HEMATOCRIT: 28.1 % — AB (ref 36.0–46.0)
HEMOGLOBIN: 9.2 g/dL — AB (ref 12.0–15.0)
Immature Granulocytes: 0 %
Lymphocytes Relative: 10 %
Lymphs Abs: 0.9 10*3/uL (ref 0.7–4.0)
MCH: 32.3 pg (ref 26.0–34.0)
MCHC: 32.7 g/dL (ref 30.0–36.0)
MCV: 98.6 fL (ref 80.0–100.0)
Monocytes Absolute: 0.9 10*3/uL (ref 0.1–1.0)
Monocytes Relative: 10 %
NEUTROS PCT: 80 %
NRBC: 0 % (ref 0.0–0.2)
Neutro Abs: 6.9 10*3/uL (ref 1.7–7.7)
Platelet Count: 225 10*3/uL (ref 150–400)
RBC: 2.85 MIL/uL — ABNORMAL LOW (ref 3.87–5.11)
RDW: 15.7 % — ABNORMAL HIGH (ref 11.5–15.5)
WBC: 8.7 10*3/uL (ref 4.0–10.5)

## 2018-07-15 MED ORDER — BISACODYL 5 MG PO TBEC
10.0000 mg | DELAYED_RELEASE_TABLET | Freq: Every day | ORAL | Status: DC
  Administered 2018-07-15 – 2018-07-17 (×3): 10 mg via ORAL
  Filled 2018-07-15 (×3): qty 2

## 2018-07-15 MED ORDER — ONDANSETRON HCL 4 MG/2ML IJ SOLN
8.0000 mg | Freq: Once | INTRAMUSCULAR | Status: AC
  Administered 2018-07-15: 8 mg via INTRAVENOUS

## 2018-07-15 MED ORDER — LACTULOSE 10 GM/15ML PO SOLN: 20.0000 g | Freq: Every day | ORAL | Status: DC | PRN

## 2018-07-15 MED ORDER — HYDROMORPHONE HCL 2 MG/ML IJ SOLN
2.0000 mg | INTRAMUSCULAR | Status: AC
  Administered 2018-07-15: 2 mg via INTRAVENOUS
  Filled 2018-07-15: qty 1

## 2018-07-15 MED ORDER — MORPHINE SULFATE (PF) 4 MG/ML IV SOLN
INTRAVENOUS | Status: AC
  Filled 2018-07-15: qty 1

## 2018-07-15 MED ORDER — POTASSIUM CHLORIDE IN NACL 20-0.9 MEQ/L-% IV SOLN
INTRAVENOUS | Status: DC
  Administered 2018-07-15 – 2018-07-17 (×5): via INTRAVENOUS
  Filled 2018-07-15 (×6): qty 1000

## 2018-07-15 MED ORDER — FENTANYL 75 MCG/HR TD PT72
75.0000 ug | MEDICATED_PATCH | TRANSDERMAL | Status: DC
  Administered 2018-07-15: 75 ug via TRANSDERMAL
  Filled 2018-07-15: qty 1

## 2018-07-15 MED ORDER — SODIUM CHLORIDE 0.9 % IV SOLN
Freq: Once | INTRAVENOUS | Status: AC
  Administered 2018-07-15: 10:00:00 via INTRAVENOUS
  Filled 2018-07-15: qty 250

## 2018-07-15 MED ORDER — LIP MEDEX EX OINT
TOPICAL_OINTMENT | CUTANEOUS | Status: AC
  Administered 2018-07-15: 13:00:00
  Filled 2018-07-15: qty 7

## 2018-07-15 MED ORDER — DEXAMETHASONE SODIUM PHOSPHATE 10 MG/ML IJ SOLN
INTRAMUSCULAR | Status: AC
  Filled 2018-07-15: qty 1

## 2018-07-15 MED ORDER — ONDANSETRON HCL 4 MG/2ML IJ SOLN
INTRAMUSCULAR | Status: AC
  Filled 2018-07-15: qty 4

## 2018-07-15 MED ORDER — POTASSIUM CHLORIDE 10 MEQ/100ML IV SOLN: 10.0000 meq | INTRAVENOUS | Status: DC

## 2018-07-15 MED ORDER — SENNOSIDES-DOCUSATE SODIUM 8.6-50 MG PO TABS
2.0000 | ORAL_TABLET | Freq: Every day | ORAL | Status: DC
  Administered 2018-07-15 – 2018-07-16 (×2): 2 via ORAL
  Filled 2018-07-15 (×2): qty 2

## 2018-07-15 MED ORDER — DEXAMETHASONE 4 MG PO TABS
6.0000 mg | ORAL_TABLET | Freq: Two times a day (BID) | ORAL | Status: DC
  Administered 2018-07-15 – 2018-07-20 (×11): 6 mg via ORAL
  Filled 2018-07-15 (×11): qty 2

## 2018-07-15 MED ORDER — GABAPENTIN 300 MG PO CAPS
300.0000 mg | ORAL_CAPSULE | ORAL | Status: DC
  Administered 2018-07-15 – 2018-07-20 (×10): 300 mg via ORAL
  Filled 2018-07-15 (×9): qty 1

## 2018-07-15 MED ORDER — MORPHINE SULFATE 4 MG/ML IJ SOLN
2.0000 mg | Freq: Once | INTRAMUSCULAR | Status: AC
  Administered 2018-07-15: 2 mg via INTRAVENOUS
  Filled 2018-07-15: qty 1

## 2018-07-15 MED ORDER — SODIUM CHLORIDE 0.9 % IV SOLN: 10.0000 mg | Freq: Once | INTRAVENOUS | Status: DC

## 2018-07-15 MED ORDER — DEXAMETHASONE SODIUM PHOSPHATE 10 MG/ML IJ SOLN
10.0000 mg | Freq: Once | INTRAMUSCULAR | Status: AC
  Administered 2018-07-15: 10 mg via INTRAVENOUS

## 2018-07-15 MED ORDER — BISACODYL 10 MG RE SUPP
10.0000 mg | Freq: Once | RECTAL | Status: AC
  Administered 2018-07-15: 10 mg via RECTAL
  Filled 2018-07-15: qty 1

## 2018-07-15 MED ORDER — ONDANSETRON 4 MG PO TBDP
4.0000 mg | ORAL_TABLET | Freq: Three times a day (TID) | ORAL | Status: DC | PRN
  Filled 2018-07-15: qty 1

## 2018-07-15 MED ORDER — HYDROMORPHONE HCL 2 MG/ML IJ SOLN
2.0000 mg | INTRAMUSCULAR | Status: DC | PRN
  Administered 2018-07-15 – 2018-07-17 (×10): 2 mg via INTRAVENOUS
  Filled 2018-07-15 (×10): qty 1

## 2018-07-15 MED ORDER — GABAPENTIN 300 MG PO CAPS
600.0000 mg | ORAL_CAPSULE | Freq: Every day | ORAL | Status: DC
  Administered 2018-07-15 – 2018-07-19 (×5): 600 mg via ORAL
  Filled 2018-07-15 (×5): qty 2

## 2018-07-15 MED ORDER — ONDANSETRON HCL 4 MG/2ML IJ SOLN: 4.0000 mg | Freq: Four times a day (QID) | INTRAMUSCULAR | Status: DC | PRN

## 2018-07-15 MED ORDER — ENOXAPARIN SODIUM 40 MG/0.4ML ~~LOC~~ SOLN
40.0000 mg | SUBCUTANEOUS | Status: DC
  Administered 2018-07-15 – 2018-07-19 (×5): 40 mg via SUBCUTANEOUS
  Filled 2018-07-15 (×5): qty 0.4

## 2018-07-15 NOTE — Telephone Encounter (Signed)
Patient called and left two voicemail's this am requesting a return phone call. Called the patient and she stated she is having abdominal pain and thinks she is dehydrated. Patient stated she missed her appointment with Dr. Julien Nordmann yesterday because she got her days mixed up. Patient given an appointment to see Alfredia Client today at 10:00 with labs prior at 9:30. Patient verbalized understanding and stated she will be here.

## 2018-07-15 NOTE — Progress Notes (Signed)
Palliative Medicine consult noted. Due to high referral volume, there may be a delay seeing this patient. Please call the Palliative Medicine Team office at [phone] if recommendations are needed in the interim.  Thank you for inviting [place] to see this patient.  Marjie Skiff Leelynn Whetsel, RN, BSN, [facility] Palliative Medicine Team 07/15/2018 3:46 PM Office [phone]

## 2018-07-15 NOTE — H&P (Addendum)
History and Physical    Theresa [name]:[id number] DOB: [birth year] DOA: 07/15/2018  PCP: Patient, No Pcp Per  Patient coming from: Home    Chief Complaint: Intractable pain  HPI: Theresa [name] is a 57 y.o. female very pleasant young lady with medical history significant of metastatic non-small cell lung cancer finished 6 cycles of gemcitabine directly admitted from cancer center with intractable pain in the back of her chest.  Patient normally takes fentanyl and Doxy at home she ran out of fentanyl.  She has large tumor eroding through her posterior right chest wall.  She was given morphine 2 mg, Decadron at the cancer center.  She does complain of nausea but has had no vomiting however she is very constipated she said her mother had to disimpact her last night at home.  She denies any abdominal pain anterior chest pain shortness of breath cough fever or chills.  Her sister was present throughout the conversation. ED Course: Patient admitted from cancer center directly.  Review of Systems: As per HPI otherwise 10 point review of systems negative.   Past Medical History:  Diagnosis Date  . Cancer ([place])    5 years ago - cervical cancer  . Encounter for antineoplastic chemotherapy 08/27/2015  . History of radiation therapy 08/19/2015 - 10/07/2015   Site/dose:   The patient's primary tumor and involved lymph nodes were treated to 66 Gy in 30 fractions.  . Hypokalemia 09/30/2015  . Non-small cell carcinoma of lung, stage 3 ([place]) 08/08/2015  . Paroxysmal atrial flutter ([place]) 09/16/2015  . Pneumonia     Past Surgical History:  Procedure Laterality Date  . IR FLUORO GUIDE PORT INSERTION RIGHT  05/21/2017  . IR US GUIDE VASC ACCESS RIGHT  05/21/2017  . TUBAL LIGATION    . VIDEO BRONCHOSCOPY WITH ENDOBRONCHIAL ULTRASOUND N/A 08/02/2015   Procedure: VIDEO BRONCHOSCOPY WITH ENDOBRONCHIAL ULTRASOUND;  Surgeon: [name], MD;  Location: [facility];  Service: Thoracic;  Laterality:  N/A;  . WISDOM TOOTH EXTRACTION       reports that she quit smoking about 27 years ago. Her smoking use included cigarettes. She has a 1.50 pack-year smoking history. She has never used smokeless tobacco. She reports that she does not drink alcohol or use drugs.  No Known Allergies  Family History  Problem Relation Age of Onset  . COPD Mother   . Diabetes type II Mother   . Heart disease Mother   . High Cholesterol Mother   . Lung cancer Father      Prior to Admission medications   Medication Sig Start Date End Date Taking? Authorizing Provider  fentaNYL (DURAGESIC - DOSED MCG/HR) 50 MCG/HR Place 1 patch (50 mcg total) onto the skin every 3 (three) days. 07/01/18  Yes Curt Bears, MD  gabapentin (NEURONTIN) 300 MG capsule Take 1 capsule in the morning, 1 capsule in the afternoon, and 2 capsules at bedtime. 05/27/18  Yes Curt Bears, MD  metoprolol tartrate (LOPRESSOR) 25 MG tablet Take 0.5 tablets (12.5 mg total) by mouth 2 (two) times daily. 01/06/18  Yes [name], PA-C  ondansetron (ZOFRAN ODT) 4 MG disintegrating tablet Take 1 tablet (4 mg total) by mouth every 8 (eight) hours as needed for nausea or vomiting. 06/13/18  Yes Curt Bears, MD  oxyCODONE (OXY IR/ROXICODONE) 5 MG immediate release tablet Take 1 tablet (5 mg total) by mouth every 6 (six) hours as needed for severe pain. 07/01/18  Yes Curt Bears, MD  lidocaine (LIDODERM) 5 %  Place 1 patch onto the skin daily. Remove & Discard patch within 12 hours or as directed by MD Patient not taking: Reported on 07/15/2018 05/12/18   Curt Bears, MD  lidocaine-prilocaine (EMLA) cream Apply 1 application topically as needed. Patient not taking: Reported on 07/15/2018 02/24/18   Curt Bears, MD  LORazepam (ATIVAN) 0.5 MG tablet 0.5 mg sublingual p.o. every 6 hours as needed for nausea and/or vomiting. Patient not taking: Reported on 07/15/2018 05/12/18   Sandi Mealy E., PA-C  mirtazapine (REMERON) 30 MG tablet  Take 1 tablet (30 mg total) by mouth at bedtime. Patient not taking: Reported on 07/15/2018 03/23/18   Maryanna Shape, NP  Multiple Vitamin (MULTIVITAMIN) capsule Take 1 capsule daily by mouth. Patient not taking: Reported on 07/15/2018 07/29/17   Curt Bears, MD  ondansetron (ZOFRAN) 8 MG tablet Take 1 tablet (8 mg total) by mouth every 8 (eight) hours as needed for nausea or vomiting. Patient not taking: Reported on 07/15/2018 05/12/18   Sandi Mealy E., PA-C  potassium chloride SA (K-DUR,KLOR-CON) 20 MEQ tablet Take 2 tabs every morning and 2 tab every evening. Patient not taking: Reported on 07/15/2018 05/11/18   Curt Bears, MD  prochlorperazine (COMPAZINE) 10 MG tablet Take 1 tablet (10 mg total) by mouth every 6 (six) hours as needed for nausea or vomiting. Patient not taking: Reported on 07/15/2018 03/11/18   Maryanna Shape, NP    Physical Exam: There were no vitals filed for this visit.  Constitutional: NAD, calm, comfortable There were no vitals filed for this visit. Eyes: PERRL, lids and conjunctivae normal ENMT: Mucous membranes are moist. Posterior pharynx clear of any exudate or lesions.Normal dentition.  Neck: normal, supple, no masses, no thyromegaly Respiratory: clear to auscultation bilaterally, no wheezing, no crackles. Normal respiratory effort. No accessory muscle use.  Cardiovascular: Regular rate and rhythm, no murmurs / rubs / gallops. No extremity edema. 2+ pedal pulses. No carotid bruits.  Abdomen: no tenderness, no masses palpated. No hepatosplenomegaly. Bowel sounds positive.  Musculoskeletal MASS RIGHT POSTERIOR CHEST  10/6 WARM AND HARD TO TOUCH  Neurologic: CN 2-12 grossly intact. Sensation intact, DTR normal. Strength 5/5 in all 4.  Psychiatric: Normal judgment and insight. Alert and oriented x 3. Normal mood.   Labs on Admission: I have personally reviewed following labs and imaging studies  CBC: Recent Labs  Lab 07/15/18 1002  WBC 8.7    NEUTROABS 6.9  HGB 9.2*  HCT 28.1*  MCV 98.6  PLT 295   Basic Metabolic Panel: Recent Labs  Lab 07/15/18 1002  NA 136  K 3.0*  CL 99  CO2 24  GLUCOSE 117*  BUN 6  CREATININE 0.64  CALCIUM 9.8   GFR: CrCl cannot be calculated (Unknown ideal weight.). Liver Function Tests: Recent Labs  Lab 07/15/18 1002  AST 49*  ALT 10  ALKPHOS 80  BILITOT 0.8  PROT 8.3*  ALBUMIN 2.9*   No results for input(s): LIPASE, AMYLASE in the last 168 hours. No results for input(s): AMMONIA in the last 168 hours. Coagulation Profile: No results for input(s): INR, PROTIME in the last 168 hours. Cardiac Enzymes: No results for input(s): CKTOTAL, CKMB, CKMBINDEX, TROPONINI in the last 168 hours. BNP (last 3 results) No results for input(s): PROBNP in the last 8760 hours. HbA1C: No results for input(s): HGBA1C in the last 72 hours. CBG: No results for input(s): GLUCAP in the last 168 hours. Lipid Profile: No results for input(s): CHOL, HDL, LDLCALC, TRIG, CHOLHDL, LDLDIRECT in  the last 72 hours. Thyroid Function Tests: No results for input(s): TSH, T4TOTAL, FREET4, T3FREE, THYROIDAB in the last 72 hours. Anemia Panel: No results for input(s): VITAMINB12, FOLATE, FERRITIN, TIBC, IRON, RETICCTPCT in the last 72 hours. Urine analysis:    Component Value Date/Time   COLORURINE YELLOW 11/02/2017 0808   APPEARANCEUR CLEAR 11/02/2017 0808   LABSPEC 1.012 11/02/2017 0808   PHURINE 6.0 11/02/2017 0808   GLUCOSEU NEGATIVE 11/02/2017 0808   HGBUR NEGATIVE 11/02/2017 0808   BILIRUBINUR NEGATIVE 11/02/2017 0808   KETONESUR NEGATIVE 11/02/2017 0808   PROTEINUR NEGATIVE 11/02/2017 0808   UROBILINOGEN 1.0 07/15/2015 1144   NITRITE NEGATIVE 11/02/2017 0808   LEUKOCYTESUR NEGATIVE 11/02/2017 0808    Radiological Exams on Admission: No results found.  Assessment/Plan Active Problems:   Intractable pain #1 intractable pain secondary to metastatic lung cancer with mets to the right posterior  back of the chest patient has received radiation to that area already without much improvement in pain.Will increase fentanyl patch to 75 MCG Dilaudid for breakthrough pain and Decadron 4 mg 3 times a day.  Consult palliative care.  #2 severe constipation I will start her on Dulcolax and senna and as needed lactulose  #3 history of hypertension her blood pressure is soft I will order Lopressor.  #4 chronic hypokalemia replete  #5 chemotherapy-induced neuropathy continue Neurontin  DVT prophylaxis: Lovenox Code Status: Full code  Family Communication: Discussed with sister who was in the room Disposition Plan: Pending clinical improvement Consults called: None Admission status: Observation   Georgette Shell MD [facility]  If 7PM-7AM, please contact night-coverage [email or web address] Password [email or web address]  07/15/2018, 1:25 PM

## 2018-07-15 NOTE — Progress Notes (Signed)
[facility] Progress Note   Theresa [name] [id number] December 08, 1960 57 y.o.  Doris Cheadle is managed by Dr. Fanny Bien. Mohamed  Actively treated with chemotherapy/immunotherapy: no  Current Therapy: Most recently treated with gemcitabine which was first dosed on 01/12/2018.  She is completed 6 cycles of therapy.   Assessment: Plan:    Cancer associated pain - Plan: morphine 4 MG/ML injection 2 mg, dexamethasone (DECADRON) injection 10 mg, morphine 4 MG/ML injection 2 mg, DISCONTINUED: dexamethasone (DECADRON) 10 mg in sodium chloride 0.9 % 50 mL IVPB  Nausea and vomiting, intractability of vomiting not specified, unspecified vomiting type - Plan: 0.9 %  sodium chloride infusion, ondansetron (ZOFRAN) injection 8 mg, dexamethasone (DECADRON) injection 10 mg, DISCONTINUED: dexamethasone (DECADRON) 10 mg in sodium chloride 0.9 % 50 mL IVPB  Hypokalemia   Cancer associated pain: The patient presents to the office today in pain crisis.  The pain is reported being in her right lateral chest and posterior superior right chest.  She has a large tumor which is eroding through her posterior chest wall.  She was given morphine 2 mg IV x2 and Decadron 10 mg IV x1.  Dr. Landis Gandy has graciously agreed to accept this patient as a direct admission.  I discussed with Dr. Rodena Piety that I would like to have Theresa [name] see palliative care while she is an inpatient.  We have discussed today the treatment options of either starting her on new chemotherapy which likely with have a higher side effect profile and would likely not cause a rapid reduction of the patient's tumor burden.  The other option that is been discussed with the patient and her family is transitioning to hospice care.  I discussed DNR status with the patient today however she was unable to make a decision without first talking to her family.  Based on this she will be admitted as a full code with plans for ongoing  discussion.  Nausea and vomiting: The patient was given Zofran 8 mg IV and was begun on 1 L of normal saline.  Hypokalemia: The patient has a history of hypokalemia.  Her labs returned today with a potassium of 3.0.  Initially we plan on giving her 40 mEq of potassium p.o. however, she reports that she is having increasing difficulty in swallowing pills.  Please see After Visit Summary for patient specific instructions.  Future Appointments  Date Time Provider [place]  07/18/2018  8:45 AM [facility] LAB 1 [facility] None  07/18/2018  9:00 AM [facility] [place] FLUSH [facility] None  07/18/2018  9:30 AM Curt Bears, MD [facility] None    No orders of the defined types were placed in this encounter.      Subjective:   Patient ID:  Theresa [name] is a 57 y.o. (DOB 11-20-1960) female.  Chief Complaint: No chief complaint on file.   HPI Theresa [name] is a very pleasant 57 year old female with a history of a metastatic non-small cell lung cancer which was originally diagnosed in November 2016 as an invasive poorly differentiated squamous cell carcinoma.  She originally presented with a large right hilar mass with collapse of the right middle lobe and extension into the right upper lobe and right lower lobe.   A metastatic intramuscular mass in the right posterior lateral chest wall was biopsied and returned as a squamous cell carcinoma.  She was originally treated with carboplatin and paclitaxel and completed 5 cycles with a significant response to treatment.  She is  next followed by consolidative chemotherapy with carboplatin and paclitaxel with Neulasta support.  She also received palliative radiation to the right posterior lateral chest wall under the care of Dr. Tammi Klippel.  She was then treated in August 2018 with could Trudo which was given every 3 weeks.  She completed 3 cycles.  This was discontinued secondary to disease progression.  She was again treated with systemic  carboplatin and paclitaxel with Neulasta support.  This was restarted in December 2018.  She completed 6 cycles of therapy.  Most recently she has been treated with gemcitabine which was given on day 1 and 8 every 21-day cycle and was first dose on 01/12/2018.  She is completed 6 cycles of this chemotherapy.  She was last seen in our office on 06/01/2018 at which time a discussion was undertaken regarding possible options.  Dr. Julien [name] had discussed the use of navelbine or docetaxel and Cyramza versus hospice/palliative care.  He discussed with the patient that in his opinion the now being would likely not be effective.  He reported that docetaxel and Cyramza would likely have a higher side effect profile and would be more difficult on the patient.  He reported that this could cause peripheral neuropathy alopecia, nausea, and vomiting.  Additionally the patient has a history of hypokalemia for which she has been treated with oral potassium.  A potassium level returned today at 3.0.  Unfortunately she is having difficulty swallowing pills.  We had initially planned on giving her 40 mEq of potassium while in the office however she could not swallow these pills.  Also a discussion was undertaken with the patient and her family regarding CODE STATUS.  I outlined what it meant to be DNR versus a full code.  The patient states that she would like to discuss this with her other children prior to making a decision.  She will be admitted as a full code with ongoing discussion.  We especially appreciate Dr. Landis Gandy' willingness to accept this patient for direct admission.  Medications: I have reviewed the patient's current medications.  Allergies: No Known Allergies  Past Medical History:  Diagnosis Date  . Cancer ([place])    5 years ago - cervical cancer  . Encounter for antineoplastic chemotherapy 08/27/2015  . History of radiation therapy 08/19/2015 - 10/07/2015   Site/dose:   The patient's primary tumor  and involved lymph nodes were treated to 66 Gy in 30 fractions.  . Hypokalemia 09/30/2015  . Non-small cell carcinoma of lung, stage 3 ([place]) 08/08/2015  . Paroxysmal atrial flutter ([place]) 09/16/2015  . Pneumonia     Past Surgical History:  Procedure Laterality Date  . IR FLUORO GUIDE PORT INSERTION RIGHT  05/21/2017  . IR US GUIDE VASC ACCESS RIGHT  05/21/2017  . TUBAL LIGATION    . VIDEO BRONCHOSCOPY WITH ENDOBRONCHIAL ULTRASOUND N/A 08/02/2015   Procedure: VIDEO BRONCHOSCOPY WITH ENDOBRONCHIAL ULTRASOUND;  Surgeon: [name], MD;  Location: [facility] OR;  Service: Thoracic;  Laterality: N/A;  . WISDOM TOOTH EXTRACTION      Family History  Problem Relation Age of Onset  . COPD Mother   . Diabetes type II Mother   . Heart disease Mother   . High Cholesterol Mother   . Lung cancer Father     Social History   Socioeconomic History  . Marital status: Single    Spouse name: Not on file  . Number of children: Not on file  . Years of education: Not on file  .  Highest education level: Not on file  Occupational History  . Not on file  Social Needs  . Financial resource strain: Not on file  . Food insecurity:    Worry: Not on file    Inability: Not on file  . Transportation needs:    Medical: Not on file    Non-medical: Not on file  Tobacco Use  . Smoking status: Former Smoker    Packs/day: 0.30    Years: 5.00    Pack years: 1.50    Types: Cigarettes    Last attempt to quit: 07/31/1990    Years since quitting: 27.9  . Smokeless tobacco: Never Used  Substance and Sexual Activity  . Alcohol use: No    Alcohol/week: 0.0 standard drinks  . Drug use: No  . Sexual activity: Not Currently  Lifestyle  . Physical activity:    Days per week: Not on file    Minutes per session: Not on file  . Stress: Not on file  Relationships  . Social connections:    Talks on phone: Not on file    Gets together: Not on file    Attends religious service: Not on file    Active member of  club or organization: Not on file    Attends meetings of clubs or organizations: Not on file    Relationship status: Not on file  . Intimate partner violence:    Fear of current or ex partner: Not on file    Emotionally abused: Not on file    Physically abused: Not on file    Forced sexual activity: Not on file  Other Topics Concern  . Not on file  Social History Narrative  . Not on file    Past Medical History, Surgical history, Social history, and Family history were reviewed and updated as appropriate.   Please see review of systems for further details on the patient's review from today.   Review of Systems:  Review of Systems  Constitutional: Negative for chills, diaphoresis and fever.  HENT: Negative for trouble swallowing and voice change.   Respiratory: Negative for cough, chest tightness, shortness of breath and wheezing.   Cardiovascular: Positive for chest pain. Negative for palpitations.  Gastrointestinal: Negative for abdominal pain, constipation, diarrhea, nausea and vomiting.  Musculoskeletal: Positive for back pain. Negative for myalgias.  Neurological: Negative for dizziness, light-headedness and headaches.    Objective:   Physical Exam:  BP 129/88 (BP Location: Left Arm, Patient Position: Sitting)   Pulse (!) 148 Comment: RN Liza aware of pulse.  Temp 98.3 F (36.8 C) (Oral)   Resp 20   Ht 5\' 8"  (1.727 m)   LMP 03/21/2008   SpO2 100%   BMI 25.54 kg/m  ECOG: 2  Physical Exam  Constitutional: No distress.  HENT:  Head: Normocephalic and atraumatic.  Cardiovascular: Regular Theresa and normal heart sounds. Tachycardia present. Exam reveals no gallop and no friction rub.  No murmur heard. Pulmonary/Chest: Effort normal. No respiratory distress. She has decreased breath sounds in the right upper field. She has no wheezes. She has no rales.      Abdominal: Bowel sounds are normal. She exhibits no distension and no mass. There is no tenderness. There is  no guarding.  Neurological: She is alert.  Skin: Skin is warm and dry. No rash noted. She is not diaphoretic. No erythema.    Lab Review:     Component Value Date/Time   NA 136 07/15/2018 1002   NA 138 09/24/2017  1600   K 3.0 (LL) 07/15/2018 1002   K 3.4 (L) 09/24/2017 1600   CL 99 07/15/2018 1002   CO2 24 07/15/2018 1002   CO2 25 09/24/2017 1600   GLUCOSE 117 (H) 07/15/2018 1002   GLUCOSE 125 09/24/2017 1600   BUN 6 07/15/2018 1002   BUN 9.5 09/24/2017 1600   CREATININE 0.64 07/15/2018 1002   CREATININE 0.8 09/24/2017 1600   CALCIUM 9.8 07/15/2018 1002   CALCIUM 9.0 09/24/2017 1600   PROT 8.3 (H) 07/15/2018 1002   PROT 7.4 09/24/2017 1600   ALBUMIN 2.9 (L) 07/15/2018 1002   ALBUMIN 3.5 09/24/2017 1600   AST 49 (H) 07/15/2018 1002   AST 31 09/24/2017 1600   ALT 10 07/15/2018 1002   ALT 24 09/24/2017 1600   ALKPHOS 80 07/15/2018 1002   ALKPHOS 92 09/24/2017 1600   BILITOT 0.8 07/15/2018 1002   BILITOT 0.40 09/24/2017 1600   GFRNONAA >60 07/15/2018 1002   GFRAA >60 07/15/2018 1002       Component Value Date/Time   WBC 8.7 07/15/2018 1002   WBC 4.9 01/04/2018 1115   RBC 2.85 (L) 07/15/2018 1002   HGB 9.2 (L) 07/15/2018 1002   HGB 8.3 (L) 09/24/2017 1601   HCT 28.1 (L) 07/15/2018 1002   HCT 24.5 (L) 09/24/2017 1601   PLT 225 07/15/2018 1002   PLT 92 (L) 09/24/2017 1601   PLT 300 02/11/2017 1632   MCV 98.6 07/15/2018 1002   MCV 90.7 09/24/2017 1601   MCH 32.3 07/15/2018 1002   MCHC 32.7 07/15/2018 1002   RDW 15.7 (H) 07/15/2018 1002   RDW 18.7 (H) 09/24/2017 1601   LYMPHSABS 0.9 07/15/2018 1002   LYMPHSABS 0.8 (L) 09/24/2017 1601   MONOABS 0.9 07/15/2018 1002   MONOABS 0.2 09/24/2017 1601   EOSABS 0.0 07/15/2018 1002   EOSABS 0.0 09/24/2017 1601   BASOSABS 0.0 07/15/2018 1002   BASOSABS 0.0 09/24/2017 1601   -------------------------------  Imaging from last 24 hours (if applicable):  Radiology interpretation: No results found.

## 2018-07-15 NOTE — Progress Notes (Signed)
Phone report given to RN Adline Peals on [place].  Pt transported by [facility] with belongings and son by [employer].

## 2018-07-15 NOTE — Patient Instructions (Signed)
Implanted Port Home Guide An implanted port is a type of central line that is placed under the skin. Central lines are used to provide IV access when treatment or nutrition needs to be given through a person's veins. Implanted ports are used for long-term IV access. An implanted port may be placed because:  You need IV medicine that would be irritating to the small veins in your hands or arms.  You need long-term IV medicines, such as antibiotics.  You need IV nutrition for a long period.  You need frequent blood draws for lab tests.  You need dialysis.  Implanted ports are usually placed in the chest area, but they can also be placed in the upper arm, the abdomen, or the leg. An implanted port has two main parts:  Reservoir. The reservoir is round and will appear as a small, raised area under your skin. The reservoir is the part where a needle is inserted to give medicines or draw blood.  Catheter. The catheter is a thin, flexible tube that extends from the reservoir. The catheter is placed into a large vein. Medicine that is inserted into the reservoir goes into the catheter and then into the vein.  How will I care for my incision site? Do not get the incision site wet. Bathe or shower as directed by your health care provider. How is my port accessed? Special steps must be taken to access the port:  Before the port is accessed, a numbing cream can be placed on the skin. This helps numb the skin over the port site.  Your health care provider uses a sterile technique to access the port. ? Your health care provider must put on a mask and sterile gloves. ? The skin over your port is cleaned carefully with an antiseptic and allowed to dry. ? The port is gently pinched between sterile gloves, and a needle is inserted into the port.  Only "non-coring" port needles should be used to access the port. Once the port is accessed, a blood return should be checked. This helps ensure that the port  is in the vein and is not clogged.  If your port needs to remain accessed for a constant infusion, a clear (transparent) bandage will be placed over the needle site. The bandage and needle will need to be changed every week, or as directed by your health care provider.  Keep the bandage covering the needle clean and dry. Do not get it wet. Follow your health care provider's instructions on how to take a shower or bath while the port is accessed.  If your port does not need to stay accessed, no bandage is needed over the port.  What is flushing? Flushing helps keep the port from getting clogged. Follow your health care provider's instructions on how and when to flush the port. Ports are usually flushed with saline solution or a medicine called heparin. The need for flushing will depend on how the port is used.  If the port is used for intermittent medicines or blood draws, the port will need to be flushed: ? After medicines have been given. ? After blood has been drawn. ? As part of routine maintenance.  If a constant infusion is running, the port may not need to be flushed.  How long will my port stay implanted? The port can stay in for as long as your health care provider thinks it is needed. When it is time for the port to come out, surgery will be   done to remove it. The procedure is similar to the one performed when the port was put in. When should I seek immediate medical care? When you have an implanted port, you should seek immediate medical care if:  You notice a bad smell coming from the incision site.  You have swelling, redness, or drainage at the incision site.  You have more swelling or pain at the port site or the surrounding area.  You have a fever that is not controlled with medicine.  This information is not intended to replace advice given to you by your health care provider. Make sure you discuss any questions you have with your health care provider. Document  Released: 09/07/2005 Document Revised: 02/13/2016 Document Reviewed: 05/15/2013 Elsevier Interactive Patient Education  2017 [employer]  

## 2018-07-16 DIAGNOSIS — R52 Pain, unspecified: Secondary | ICD-10-CM | POA: Diagnosis not present

## 2018-07-16 LAB — CBC
HCT: 27.5 % — ABNORMAL LOW (ref 36.0–46.0)
HEMOGLOBIN: 8.5 g/dL — AB (ref 12.0–15.0)
MCH: 31.4 pg (ref 26.0–34.0)
MCHC: 30.9 g/dL (ref 30.0–36.0)
MCV: 101.5 fL — ABNORMAL HIGH (ref 80.0–100.0)
Platelets: 215 10*3/uL (ref 150–400)
RBC: 2.71 MIL/uL — ABNORMAL LOW (ref 3.87–5.11)
RDW: 16.1 % — AB (ref 11.5–15.5)
WBC: 6.4 10*3/uL (ref 4.0–10.5)
nRBC: 0 % (ref 0.0–0.2)

## 2018-07-16 LAB — BASIC METABOLIC PANEL
Anion gap: 7 (ref 5–15)
BUN: 8 mg/dL (ref 6–20)
CO2: 24 mmol/L (ref 22–32)
Calcium: 9.2 mg/dL (ref 8.9–10.3)
Chloride: 107 mmol/L (ref 98–111)
Creatinine, Ser: 0.61 mg/dL (ref 0.44–1.00)
GFR calc Af Amer: >60 mL/min (ref >60)
Glucose, Bld: 194 mg/dL — ABNORMAL HIGH (ref 70–99)
POTASSIUM: 3.8 mmol/L (ref 3.5–5.1)
Sodium: 138 mmol/L (ref 135–145)

## 2018-07-16 LAB — HIV ANTIBODY (ROUTINE TESTING W REFLEX): HIV SCREEN 4TH GENERATION: NONREACTIVE

## 2018-07-16 MED ORDER — ACETAMINOPHEN 500 MG PO TABS
1000.0000 mg | ORAL_TABLET | Freq: Four times a day (QID) | ORAL | Status: DC
  Administered 2018-07-16 – 2018-07-20 (×14): 1000 mg via ORAL
  Filled 2018-07-16 (×14): qty 2

## 2018-07-16 NOTE — Progress Notes (Signed)
PROGRESS NOTE    Theresa [name]  VZC:[id number] DOB: 16-Jun-1961 DOA: 07/15/2018 PCP: Patient, No Pcp Per   Brief Narrative: Patient is a 57 year old female with past medical history significant for metastatic non-small cell lung cancer who finished 6 cycles of gemcitabine about a month and a half ago, follows with Dr. Julien Nordmann, was directly admitted from cancer center for the management of intractable pain in the back of her chest.  She has a large tumor eroding through her posterior right chest wall and it can be felt on her right scapular region.  Complaint of nausea, constipation.  Plan is to involve palliative care for goals of care discussion and pain management.  Assessment & Plan:   Active Problems:   Metastatic lung cancer (metastasis from lung to other site) [facility])   Intractable pain  Intractable pain: Secondary to metastatic non-small cell lung cancer.  She has metastatic lesions on the right posterior back of the chest.  Continue pain management. Hopefully palliative care will also help for the outpatient pain management.  Currently she is on fentanyl, Dilaudid and also Decadron. Continue bowel regimen along with pain medications. This morning her pain is significantly better and she looks comfortable.  Metastatic lung cancer: Follows with Dr. Julien Nordmann.  Reports that she had chemotherapy about a month and half ago.  See is also status post radiation therapy to the metastatic area on the posterior right chest without much improvement. I think that this is a candidate for hospice( at home).  Palliative care already consulted.  Severe constipation: Continue Dulcolax, Senokot, lactulose  History of hypertension: Currently her blood pressure is soft.  Continue to monitor blood pressure.  Chronic hypokalemia: We will continue to monitor the levels and supplement as needed  Chemotherapy-induced neuropathy: Continue Neurontin    DVT prophylaxis: Lovenox Code Status: Full Family  Communication: Family member present at the bedside Disposition Plan: Home after palliative care evaluation, pain control   Consultants: Palliative care  Procedures: None  Antimicrobials: None  Subjective: Patient seen and examined the bedside this morning.  Feels much better today.  Pain is significantly better.  She is hemodynamically stable.  She does not have any specific complaints this morning.  Objective: Vitals:   07/15/18 1422 07/15/18 1950 07/16/18 0437  BP: 112/83 131/81 128/78  Pulse: 99 91 98  Resp: 17 16 14   Temp: 98.6 F (37 C) 97.7 F (36.5 C) 97.8 F (36.6 C)  TempSrc: Oral Oral Oral  SpO2: 96% 98% 97%    Intake/Output Summary (Last 24 hours) at 07/16/2018 1039 Last data filed at 07/16/2018 0600 Gross per 24 hour  Intake 1723.05 ml  Output -  Net 1723.05 ml   There were no vitals filed for this visit.  Examination:  General exam: Appears calm and comfortable ,Not in distress,average built HEENT:PERRL,Oral mucosa moist, Ear/Nose normal on gross exam Respiratory system: Decreased air entry on the right side  cardiovascular system: S1 & S2 heard, RRR. No JVD, murmurs, rubs, gallops or clicks. No pedal edema.  Chemo-Port on the right chest Gastrointestinal system: Abdomen is nondistended, soft and nontender. No organomegaly or masses felt. Normal bowel sounds heard. Central nervous system: Alert and oriented. No focal neurological deficits. Extremities: No edema, no clubbing ,no cyanosis, distal peripheral pulses palpable. Skin: No rashes, lesions or ulcers,no icterus ,no pallor MSK: Normal muscle bulk,tone ,power Metastatic mass on the posterior right chest, protruding through the right scapula Psychiatry: Judgement and insight appear normal. Mood & affect appropriate.  Data Reviewed: I have personally reviewed following labs and imaging studies  CBC: Recent Labs  Lab 07/15/18 1002 07/16/18 0512  WBC 8.7 6.4  NEUTROABS 6.9  --   HGB 9.2*  8.5*  HCT 28.1* 27.5*  MCV 98.6 101.5*  PLT 225 656   Basic Metabolic Panel: Recent Labs  Lab 07/15/18 1002 07/16/18 0512  NA 136 138  K 3.0* 3.8  CL 99 107  CO2 24 24  GLUCOSE 117* 194*  BUN 6 8  CREATININE 0.64 0.61  CALCIUM 9.8 9.2   GFR: CrCl cannot be calculated (Unknown ideal weight.). Liver Function Tests: Recent Labs  Lab 07/15/18 1002  AST 49*  ALT 10  ALKPHOS 80  BILITOT 0.8  PROT 8.3*  ALBUMIN 2.9*   No results for input(s): LIPASE, AMYLASE in the last 168 hours. No results for input(s): AMMONIA in the last 168 hours. Coagulation Profile: No results for input(s): INR, PROTIME in the last 168 hours. Cardiac Enzymes: No results for input(s): CKTOTAL, CKMB, CKMBINDEX, TROPONINI in the last 168 hours. BNP (last 3 results) No results for input(s): PROBNP in the last 8760 hours. HbA1C: No results for input(s): HGBA1C in the last 72 hours. CBG: No results for input(s): GLUCAP in the last 168 hours. Lipid Profile: No results for input(s): CHOL, HDL, LDLCALC, TRIG, CHOLHDL, LDLDIRECT in the last 72 hours. Thyroid Function Tests: No results for input(s): TSH, T4TOTAL, FREET4, T3FREE, THYROIDAB in the last 72 hours. Anemia Panel: No results for input(s): VITAMINB12, FOLATE, FERRITIN, TIBC, IRON, RETICCTPCT in the last 72 hours. Sepsis Labs: No results for input(s): PROCALCITON, LATICACIDVEN in the last 168 hours.  No results found for this or any previous visit (from the past 240 hour(s)).       Radiology Studies: No results found.      Scheduled Meds: . bisacodyl  10 mg Oral Daily  . dexamethasone  6 mg Oral Q12H  . enoxaparin (LOVENOX) injection  40 mg Subcutaneous Q24H  . fentaNYL  75 mcg Transdermal Q72H  . gabapentin  300 mg Oral 2 times per day  . gabapentin  600 mg Oral QHS  . senna-docusate  2 tablet Oral QHS   Continuous Infusions: . 0.9 % NaCl with KCl 20 mEq / L 100 mL/hr at 07/16/18 0600     LOS: 1 day    Time spent: 25  mins.More than 50% of that time was spent in counseling and/or coordination of care.      Shelly Coss, MD [facility] Pager [phone]  If 7PM-7AM, please contact night-coverage [email or web address] Password [email or web address] 07/16/2018, 10:39 AM

## 2018-07-17 DIAGNOSIS — Z8249 Family history of ischemic heart disease and other diseases of the circulatory system: Secondary | ICD-10-CM | POA: Diagnosis not present

## 2018-07-17 DIAGNOSIS — R5381 Other malaise: Secondary | ICD-10-CM | POA: Diagnosis present

## 2018-07-17 DIAGNOSIS — Z7189 Other specified counseling: Secondary | ICD-10-CM

## 2018-07-17 DIAGNOSIS — G629 Polyneuropathy, unspecified: Secondary | ICD-10-CM | POA: Diagnosis not present

## 2018-07-17 DIAGNOSIS — Z8541 Personal history of malignant neoplasm of cervix uteri: Secondary | ICD-10-CM | POA: Diagnosis not present

## 2018-07-17 DIAGNOSIS — R52 Pain, unspecified: Secondary | ICD-10-CM | POA: Diagnosis not present

## 2018-07-17 DIAGNOSIS — Z923 Personal history of irradiation: Secondary | ICD-10-CM | POA: Diagnosis not present

## 2018-07-17 DIAGNOSIS — Z79899 Other long term (current) drug therapy: Secondary | ICD-10-CM | POA: Diagnosis not present

## 2018-07-17 DIAGNOSIS — R531 Weakness: Secondary | ICD-10-CM | POA: Diagnosis not present

## 2018-07-17 DIAGNOSIS — C3491 Malignant neoplasm of unspecified part of right bronchus or lung: Secondary | ICD-10-CM | POA: Diagnosis not present

## 2018-07-17 DIAGNOSIS — M2550 Pain in unspecified joint: Secondary | ICD-10-CM | POA: Diagnosis not present

## 2018-07-17 DIAGNOSIS — G620 Drug-induced polyneuropathy: Secondary | ICD-10-CM | POA: Diagnosis present

## 2018-07-17 DIAGNOSIS — R269 Unspecified abnormalities of gait and mobility: Secondary | ICD-10-CM | POA: Diagnosis present

## 2018-07-17 DIAGNOSIS — C7989 Secondary malignant neoplasm of other specified sites: Secondary | ICD-10-CM | POA: Diagnosis present

## 2018-07-17 DIAGNOSIS — Z515 Encounter for palliative care: Secondary | ICD-10-CM

## 2018-07-17 DIAGNOSIS — I10 Essential (primary) hypertension: Secondary | ICD-10-CM | POA: Diagnosis present

## 2018-07-17 DIAGNOSIS — G8311 Monoplegia of lower limb affecting right dominant side: Secondary | ICD-10-CM | POA: Diagnosis present

## 2018-07-17 DIAGNOSIS — I4892 Unspecified atrial flutter: Secondary | ICD-10-CM | POA: Diagnosis not present

## 2018-07-17 DIAGNOSIS — D539 Nutritional anemia, unspecified: Secondary | ICD-10-CM | POA: Diagnosis present

## 2018-07-17 DIAGNOSIS — T451X5A Adverse effect of antineoplastic and immunosuppressive drugs, initial encounter: Secondary | ICD-10-CM | POA: Diagnosis present

## 2018-07-17 DIAGNOSIS — Z7401 Bed confinement status: Secondary | ICD-10-CM | POA: Diagnosis not present

## 2018-07-17 DIAGNOSIS — Z85118 Personal history of other malignant neoplasm of bronchus and lung: Secondary | ICD-10-CM | POA: Diagnosis not present

## 2018-07-17 DIAGNOSIS — E876 Hypokalemia: Secondary | ICD-10-CM | POA: Diagnosis present

## 2018-07-17 DIAGNOSIS — R110 Nausea: Secondary | ICD-10-CM | POA: Diagnosis present

## 2018-07-17 DIAGNOSIS — M549 Dorsalgia, unspecified: Secondary | ICD-10-CM | POA: Diagnosis present

## 2018-07-17 DIAGNOSIS — Z87891 Personal history of nicotine dependence: Secondary | ICD-10-CM | POA: Diagnosis not present

## 2018-07-17 DIAGNOSIS — G893 Neoplasm related pain (acute) (chronic): Principal | ICD-10-CM

## 2018-07-17 DIAGNOSIS — Z825 Family history of asthma and other chronic lower respiratory diseases: Secondary | ICD-10-CM | POA: Diagnosis not present

## 2018-07-17 DIAGNOSIS — Z5111 Encounter for antineoplastic chemotherapy: Secondary | ICD-10-CM | POA: Diagnosis not present

## 2018-07-17 DIAGNOSIS — Z801 Family history of malignant neoplasm of trachea, bronchus and lung: Secondary | ICD-10-CM | POA: Diagnosis not present

## 2018-07-17 DIAGNOSIS — T40605A Adverse effect of unspecified narcotics, initial encounter: Secondary | ICD-10-CM | POA: Diagnosis present

## 2018-07-17 DIAGNOSIS — K5903 Drug induced constipation: Secondary | ICD-10-CM | POA: Diagnosis present

## 2018-07-17 DIAGNOSIS — C3490 Malignant neoplasm of unspecified part of unspecified bronchus or lung: Secondary | ICD-10-CM | POA: Diagnosis not present

## 2018-07-17 LAB — BASIC METABOLIC PANEL
ANION GAP: 8 (ref 5–15)
BUN: 8 mg/dL (ref 6–20)
CO2: 24 mmol/L (ref 22–32)
Calcium: 9.1 mg/dL (ref 8.9–10.3)
Chloride: 111 mmol/L (ref 98–111)
Creatinine, Ser: 0.49 mg/dL (ref 0.44–1.00)
GFR calc Af Amer: >60 mL/min (ref >60)
GLUCOSE: 145 mg/dL — AB (ref 70–99)
POTASSIUM: 4 mmol/L (ref 3.5–5.1)
Sodium: 143 mmol/L (ref 135–145)

## 2018-07-17 LAB — VITAMIN B12: Vitamin B-12: 863 pg/mL (ref 180–914)

## 2018-07-17 LAB — FERRITIN: Ferritin: 1402 ng/mL — ABNORMAL HIGH (ref 11–307)

## 2018-07-17 LAB — RETICULOCYTES
Immature Retic Fract: 25 % — ABNORMAL HIGH (ref 2.3–15.9)
RBC.: 2.41 MIL/uL — AB (ref 3.87–5.11)
RETIC COUNT ABSOLUTE: 55.2 10*3/uL (ref 19.0–186.0)
RETIC CT PCT: 2.3 % (ref 0.4–3.1)

## 2018-07-17 LAB — IRON AND TIBC
Iron: 93 ug/dL (ref 28–170)
SATURATION RATIOS: 70 % — AB (ref 10.4–31.8)
TIBC: 133 ug/dL — ABNORMAL LOW (ref 250–450)
UIBC: 40 ug/dL

## 2018-07-17 LAB — TSH: TSH: 2.41 u[IU]/mL (ref 0.350–4.500)

## 2018-07-17 MED ORDER — FENTANYL 100 MCG/HR TD PT72
100.0000 ug | MEDICATED_PATCH | TRANSDERMAL | Status: DC
  Administered 2018-07-17 – 2018-07-20 (×3): 100 ug via TRANSDERMAL
  Filled 2018-07-17 (×3): qty 1

## 2018-07-17 MED ORDER — HYDROMORPHONE HCL 1 MG/ML IJ SOLN
1.0000 mg | INTRAMUSCULAR | Status: DC | PRN
  Administered 2018-07-17 – 2018-07-20 (×11): 1 mg via INTRAVENOUS
  Filled 2018-07-17 (×11): qty 1

## 2018-07-17 MED ORDER — POLYETHYLENE GLYCOL 3350 17 G PO PACK
17.0000 g | PACK | Freq: Every day | ORAL | Status: DC
  Administered 2018-07-17 – 2018-07-20 (×4): 17 g via ORAL
  Filled 2018-07-17 (×4): qty 1

## 2018-07-17 MED ORDER — SENNOSIDES-DOCUSATE SODIUM 8.6-50 MG PO TABS
2.0000 | ORAL_TABLET | Freq: Two times a day (BID) | ORAL | Status: DC
  Administered 2018-07-17 – 2018-07-20 (×6): 2 via ORAL
  Filled 2018-07-17 (×6): qty 2

## 2018-07-17 MED ORDER — OXYCODONE HCL 5 MG PO TABS: 5.0000 mg | ORAL_TABLET | ORAL | Status: DC | PRN

## 2018-07-17 MED ORDER — HYDROMORPHONE HCL 4 MG PO TABS
8.0000 mg | ORAL_TABLET | ORAL | Status: DC | PRN
  Administered 2018-07-17 – 2018-07-20 (×11): 8 mg via ORAL
  Filled 2018-07-17 (×12): qty 2

## 2018-07-17 MED ORDER — OXYCODONE HCL 5 MG PO TABS
5.0000 mg | ORAL_TABLET | Freq: Four times a day (QID) | ORAL | Status: DC | PRN
  Administered 2018-07-17 – 2018-07-18 (×2): 5 mg via ORAL
  Filled 2018-07-17 (×2): qty 1

## 2018-07-17 MED ORDER — SODIUM CHLORIDE 0.45 % IV SOLN
INTRAVENOUS | Status: DC
  Administered 2018-07-17 – 2018-07-20 (×6): via INTRAVENOUS

## 2018-07-17 NOTE — Progress Notes (Addendum)
PROGRESS NOTE  Theresa [name] WVP:[id number] DOB: May 22, 1961 DOA: 07/15/2018 PCP: Patient, No Pcp Per  HPI/Recap of past 24 hours: Patient is a 57 year old female with past medical history significant for metastatic non-small cell lung cancer who finished 6 cycles of gemcitabine about a month and a half ago, follows with Dr. Julien Nordmann, was directly admitted from cancer center for the management of intractable pain in the back of her chest.  She has a large tumor eroding through her posterior right chest wall and it can be felt on her right scapular region.  Complaint of nausea, constipation.  Plan is to involve palliative care for goals of care discussion and pain management.  07/17/18: Patient seen and examined at bedside.  Reports significant pain in her right scapula at the site of the tumor.  IV pain medicine added.  Palliative care team consulted and following due to poor prognosis.  Assessment/Plan: Active Problems:   Metastatic lung cancer (metastasis from lung to other site) [facility])   Intractable pain  Intractable back pain at the region of her right scapula Continue pain management Start IV pain medications as needed Continue bowel regimen Palliative care team consulted for symptoms management and goals of care  Metastatic lung cancer With mets to tip of right transverse process of T9 vertebrae Follow-up with oncology outpatient Dr. Earlie Server Post chemotherapy and radiation Palliative care consult for goals of care  Opiate-induced constipation Continue Senokot 2 tablets twice daily, Dulcolax, lactulose Add MiraLAX daily  Hypertension  Blood pressures well controlled  Chemotherapy-induced neuropathy Continue gabapentin  Macrocytic anemia Hemoglobin 8.5 with baseline hemoglobin of 9 MCV 101 Obtain iron studies  Risks: High risk for decompensation due to severe back pain requiring IV pain medications in the setting of metastasized lung cancer, multiple comorbidities,  and advanced age.  She requires another midnight for titration of pain medications.   Code Status: Full  Family Communication: None at bedside  Disposition Plan: Home when hemodynamically stable possibly tomorrow 07/18/2018   Consultants:  Palliative care team  Procedures:  None  Antimicrobials:  None  DVT prophylaxis: Subcu Lovenox   Objective: Vitals:   07/16/18 0437 07/16/18 1228 07/17/18 0512 07/17/18 1306  BP: 128/78 129/85 129/87 (!) 147/84  Pulse: 98 86 84 88  Resp: 14 16 17 16   Temp: 97.8 F (36.6 C) 97.8 F (36.6 C) 98 F (36.7 C) 97.6 F (36.4 C)  TempSrc: Oral Oral Oral Oral  SpO2: 97% 97% 96% 98%   No intake or output data in the 24 hours ending 07/17/18 1313 There were no vitals filed for this visit.  Exam:  . General: 57 y.o. year-old female well developed well nourished in no acute distress.  Alert and oriented x3. . Cardiovascular: Regular rate and rhythm with no rubs or gallops.  No thyromegaly or JVD noted.   Marland Kitchen Respiratory: Clear to auscultation with no wheezes or rales. Good inspiratory effort. . Abdomen: Soft nontender nondistended with normal bowel sounds x4 quadrants. . Musculoskeletal: No lower extremity edema. 2/4 pulses in all 4 extremities.  Mass visualized and palpated at the right scapula. . Skin: No ulcerative lesions noted or rashes . Psychiatry: Mood is appropriate for condition and setting   Data Reviewed: CBC: Recent Labs  Lab 07/15/18 1002 07/16/18 0512  WBC 8.7 6.4  NEUTROABS 6.9  --   HGB 9.2* 8.5*  HCT 28.1* 27.5*  MCV 98.6 101.5*  PLT 225 546   Basic Metabolic Panel: Recent Labs  Lab 07/15/18 1002 07/16/18  3875 07/17/18 0600  NA 136 138 143  K 3.0* 3.8 4.0  CL 99 107 111  CO2 24 24 24   GLUCOSE 117* 194* 145*  BUN 6 8 8   CREATININE 0.64 0.61 0.49  CALCIUM 9.8 9.2 9.1   GFR: CrCl cannot be calculated (Unknown ideal weight.). Liver Function Tests: Recent Labs  Lab 07/15/18 1002  AST 49*  ALT 10   ALKPHOS 80  BILITOT 0.8  PROT 8.3*  ALBUMIN 2.9*   No results for input(s): LIPASE, AMYLASE in the last 168 hours. No results for input(s): AMMONIA in the last 168 hours. Coagulation Profile: No results for input(s): INR, PROTIME in the last 168 hours. Cardiac Enzymes: No results for input(s): CKTOTAL, CKMB, CKMBINDEX, TROPONINI in the last 168 hours. BNP (last 3 results) No results for input(s): PROBNP in the last 8760 hours. HbA1C: No results for input(s): HGBA1C in the last 72 hours. CBG: No results for input(s): GLUCAP in the last 168 hours. Lipid Profile: No results for input(s): CHOL, HDL, LDLCALC, TRIG, CHOLHDL, LDLDIRECT in the last 72 hours. Thyroid Function Tests: No results for input(s): TSH, T4TOTAL, FREET4, T3FREE, THYROIDAB in the last 72 hours. Anemia Panel: No results for input(s): VITAMINB12, FOLATE, FERRITIN, TIBC, IRON, RETICCTPCT in the last 72 hours. Urine analysis:    Component Value Date/Time   COLORURINE YELLOW 11/02/2017 0808   APPEARANCEUR CLEAR 11/02/2017 0808   LABSPEC 1.012 11/02/2017 0808   PHURINE 6.0 11/02/2017 0808   GLUCOSEU NEGATIVE 11/02/2017 0808   HGBUR NEGATIVE 11/02/2017 0808   BILIRUBINUR NEGATIVE 11/02/2017 0808   KETONESUR NEGATIVE 11/02/2017 0808   PROTEINUR NEGATIVE 11/02/2017 0808   UROBILINOGEN 1.0 07/15/2015 1144   NITRITE NEGATIVE 11/02/2017 0808   LEUKOCYTESUR NEGATIVE 11/02/2017 0808   Sepsis Labs: @LABRCNTIP (procalcitonin:4,lacticidven:4)  )No results found for this or any previous visit (from the past 240 hour(s)).    Studies: No results found.  Scheduled Meds: . acetaminophen  1,000 mg Oral Q6H  . bisacodyl  10 mg Oral Daily  . dexamethasone  6 mg Oral Q12H  . enoxaparin (LOVENOX) injection  40 mg Subcutaneous Q24H  . fentaNYL  75 mcg Transdermal Q72H  . gabapentin  300 mg Oral 2 times per day  . gabapentin  600 mg Oral QHS  . senna-docusate  2 tablet Oral QHS    Continuous Infusions: . 0.9 % NaCl with  KCl 20 mEq / L 100 mL/hr at 07/17/18 0848     LOS: 1 day     Kayleen Memos, MD [facility] Pager [phone]  If 7PM-7AM, please contact night-coverage [email or web address] Password [email or web address] 07/17/2018, 1:13 PM

## 2018-07-17 NOTE — Consult Note (Signed)
Consultation Note Date: 07/17/2018   Patient Name: Theresa [name]  DOB: [birth year]  MRN: [id number]  Age / Sex: 57 y.o., female  PCP: Patient, No Pcp Per Referring Physician: Kayleen Memos, DO  Reason for Consultation: Establishing goals of care and Non pain symptom management  HPI/Patient Profile: 57 y.o. female  with past medical history of metastatic NSCLC admitted on 07/15/2018 with intractable back pain.  Palliative consulted for pain management and goals of care.  Clinical Assessment and Goals of Care: I met today with patient and her family, including son, 2 daughters, and sister.  Values and goals of care important to patient and family were attempted to be elicited.  She reports that the most important things are her faith and her family.  I introduced palliative care as specialized medical care for people living with serious illness. It focuses on providing relief from the symptoms and stress of a serious illness. The goal is to improve quality of life for both the patient and the family.  We discussed clinical course including continued progression of her disease on multiple agents.  We also wishes moving forward in regard to advanced directives.  Concepts specific to code status and rehospitalization discussed.    Concept of Hospice and Palliative Care were discussed  Questions and concerns addressed.   PMT will continue to support holistically.   SUMMARY OF RECOMMENDATIONS   -Full code/full scope.  Discussed code status today.  She reports needing time to consider further. - Discussed naming HCPOA.  She will consider. - She reports understanding that she has serious disease and may not benefit from further disease modifying therapy, but she needs to discuss this further with Dr. Julien Nordmann. - Pain: Reports improved since admission.  She has been getting IV dilaudid for breakthrough pain.   Plan to increase fentanyl patch to 124mg.  Hydromorphone 868mPO every 3 hours as needed.  Continue IV dilaudid as backup if oral route ineffective.  Code Status/Advance Care Planning:  Full code   Symptom Management:   As above  Palliative Prophylaxis:   Bowel Regimen and Frequent Pain Assessment  Additional Recommendations (Limitations, Scope, Preferences):  Full Scope Treatment  Psycho-social/Spiritual:   Desire for further Chaplaincy support:no  Additional Recommendations: Caregiving  Support/Resources  Prognosis:   Unable to determine  Discharge Planning: To Be Determined      Primary Diagnoses: Present on Admission: . Intractable pain   I have reviewed the medical record, interviewed the patient and family, and examined the patient. The following aspects are pertinent.  Past Medical History:  Diagnosis Date  . [place]   [place] - [place] cancer  . Encounter for antineoplastic chemotherapy 08/27/2015  . History of radiation therapy 08/19/2015 - 10/07/2015   Site/dose:   The patient's primary tumor and involved lymph nodes were treated to 66 Gy in 30 fractions.  . Hypokalemia 09/30/2015  . [place]-small cell carcinoma of [place], stage 3 [place]17[place]2016  . Paroxysmal atrial flutter ([facility]  . Pneumonia  Social History   Socioeconomic History  . Marital status: Single    Spouse name: Not on file  . Number of children: Not on file  . Years of education: Not on file  . Highest education level: Not on file  Occupational History  . Not on file  Social Needs  . Financial resource strain: Not on file  . Food insecurity:    Worry: Not on file    Inability: Not on file  . Transportation needs:    Medical: Not on file    Non-medical: Not on file  Tobacco Use  . Smoking status: Former Smoker    Packs/day: 0.30    Years: 5.00    Pack years: 1.50    Types: Cigarettes    Last attempt to quit: 07/31/1990    Years since quitting: 27.9  .  Smokeless tobacco: Never Used  Substance and Sexual Activity  . Alcohol use: No    Alcohol/week: 0.0 standard drinks  . Drug use: No  . Sexual activity: Not Currently  Lifestyle  . Physical activity:    Days per week: Not on file    Minutes per session: Not on file  . Stress: Not on file  Relationships  . Social connections:    Talks on phone: Not on file    Gets together: Not on file    Attends religious service: Not on file    Active member of club or organization: Not on file    Attends meetings of clubs or organizations: Not on file    Relationship status: Not on file  Other Topics Concern  . Not on file  Social History Narrative  . Not on file   Family History  Problem Relation Age of Onset  . COPD Mother   . Diabetes type II Mother   . Heart disease Mother   . High Cholesterol Mother   . Lung cancer Father    Scheduled Meds: . acetaminophen  1,000 mg Oral Q6H  . dexamethasone  6 mg Oral Q12H  . enoxaparin (LOVENOX) injection  40 mg Subcutaneous Q24H  . fentaNYL  100 mcg Transdermal Q72H  . gabapentin  300 mg Oral 2 times per day  . gabapentin  600 mg Oral QHS  . polyethylene glycol  17 g Oral Daily  . senna-docusate  2 tablet Oral BID   Continuous Infusions: . sodium chloride 75 mL/hr at 07/17/18 1534   PRN Meds:.HYDROmorphone (DILAUDID) injection, HYDROmorphone, ondansetron (ZOFRAN) IV, ondansetron, oxyCODONE Medications Prior to Admission:  Prior to Admission medications   Medication Sig Start Date End Date Taking? Authorizing Provider  fentaNYL (DURAGESIC - DOSED MCG/HR) 50 MCG/HR Place 1 patch (50 mcg total) onto the skin every 3 (three) days. 07/01/18  Yes Curt Bears, MD  gabapentin (NEURONTIN) 300 MG capsule Take 1 capsule in the morning, 1 capsule in the afternoon, and 2 capsules at bedtime. 05/27/18  Yes Curt Bears, MD  metoprolol tartrate (LOPRESSOR) 25 MG tablet Take 0.5 tablets (12.5 mg total) by mouth 2 (two) times daily. 01/06/18  Yes  [name], PA-C  ondansetron (ZOFRAN ODT) 4 MG disintegrating tablet Take 1 tablet (4 mg total) by mouth every 8 (eight) hours as needed for nausea or vomiting. 06/13/18  Yes Curt Bears, MD  oxyCODONE (OXY IR/ROXICODONE) 5 MG immediate release tablet Take 1 tablet (5 mg total) by mouth every 6 (six) hours as needed for severe pain. 07/01/18  Yes Curt Bears, MD  lidocaine (LIDODERM) 5 % Place 1 patch  onto the skin daily. Remove & Discard patch within 12 hours or as directed by MD Patient not taking: Reported on 07/15/2018 05/12/18   Curt Bears, MD  lidocaine-prilocaine (EMLA) cream Apply 1 application topically as needed. Patient not taking: Reported on 07/15/2018 02/24/18   Curt Bears, MD  LORazepam (ATIVAN) 0.5 MG tablet 0.5 mg sublingual p.o. every 6 hours as needed for nausea and/or vomiting. Patient not taking: Reported on 07/15/2018 05/12/18   Sandi Mealy E., PA-C  mirtazapine (REMERON) 30 MG tablet Take 1 tablet (30 mg total) by mouth at bedtime. Patient not taking: Reported on 07/15/2018 03/23/18   Maryanna Shape, NP  Multiple Vitamin (MULTIVITAMIN) capsule Take 1 capsule daily by mouth. Patient not taking: Reported on 07/15/2018 07/29/17   Curt Bears, MD  ondansetron (ZOFRAN) 8 MG tablet Take 1 tablet (8 mg total) by mouth every 8 (eight) hours as needed for nausea or vomiting. Patient not taking: Reported on 07/15/2018 05/12/18   Sandi Mealy E., PA-C  potassium chloride SA (K-DUR,KLOR-CON) 20 MEQ tablet Take 2 tabs every morning and 2 tab every evening. Patient not taking: Reported on 07/15/2018 05/11/18   Curt Bears, MD  prochlorperazine (COMPAZINE) 10 MG tablet Take 1 tablet (10 mg total) by mouth every 6 (six) hours as needed for nausea or vomiting. Patient not taking: Reported on 07/15/2018 03/11/18   Maryanna Shape, NP   No Known Allergies Review of Systems  Constitutional: Positive for activity change and appetite change. Negative for fatigue.    Musculoskeletal: Positive for back pain.   Physical Exam General: Alert, awake, in no acute distress.  HEENT: No bruits, no goiter, no JVD Heart: Regular rate and rhythm. No murmur appreciated. Lungs: Good air movement, clear Abdomen: Soft, nontender, nondistended, positive bowel sounds.  Ext: No significant edema Skin: Warm and dry Neuro: Grossly intact, nonfocal.   Vital Signs: BP (!) 149/89 (BP Location: Right Arm)   Pulse 86   Temp 98.1 F (36.7 C) (Oral)   Resp 17   LMP 03/21/2008   SpO2 100%  Pain Scale: 0-10   Pain Score: 3 ("much better" encouraged to try & control pain with PO meds.)   SpO2: SpO2: 100 % O2 Device:SpO2: 100 % O2 Flow Rate: .   IO: Intake/output summary: No intake or output data in the 24 hours ending 07/17/18 2125  LBM: Last BM Date: 07/16/18 Baseline Weight:   Most recent weight:       Palliative Assessment/Data:   Flowsheet Rows     Most Recent Value  Intake Tab  Referral Department  Hospitalist  Unit at Time of Referral  Oncology Unit  Palliative Care Primary Diagnosis  Cancer  Date Notified  07/15/18  Reason for referral  Pain, Non-pain Symptom, Clarify Goals of Care  Date of Admission  07/15/18  Date first seen by Palliative Care  07/17/18  # of days Palliative referral response time  2 Day(s)  # of days IP prior to Palliative referral  0  Clinical Assessment  Palliative Performance Scale Score  50%  Psychosocial & Spiritual Assessment  Palliative Care Outcomes  Patient/Family meeting held?  Yes  Who was at the meeting?  Patient, 2 daughters, son, sister  Palliative Care Outcomes  Improved pain interventions, Improved non-pain symptom therapy, Clarified goals of care      Time In: 1400  Time Out: 1530 Time Total: 90 Greater than 50%  of this time was spent counseling and coordinating care related to the above assessment  and plan.  Signed by: Micheline Rough, MD   Please contact Palliative Medicine Team phone at [phone]  for questions and concerns.  For individual provider: See Shea Evans

## 2018-07-18 ENCOUNTER — Inpatient Hospital Stay: Payer: Medicare Other | Admitting: Internal Medicine

## 2018-07-18 ENCOUNTER — Inpatient Hospital Stay: Payer: Medicare Other

## 2018-07-18 LAB — HEMOGLOBIN A1C
Hgb A1c MFr Bld: 5.1 % (ref 4.8–5.6)
Mean Plasma Glucose: 99.67 mg/dL

## 2018-07-18 MED ORDER — ADULT MULTIVITAMIN W/MINERALS CH
1.0000 | ORAL_TABLET | Freq: Every day | ORAL | Status: DC
  Administered 2018-07-18 – 2018-07-20 (×3): 1 via ORAL
  Filled 2018-07-18 (×3): qty 1

## 2018-07-18 MED ORDER — MULTIVITAMINS PO CAPS: 1.0000 | ORAL_CAPSULE | Freq: Every day | ORAL | Status: DC

## 2018-07-18 MED ORDER — METOPROLOL TARTRATE 25 MG PO TABS
12.5000 mg | ORAL_TABLET | Freq: Two times a day (BID) | ORAL | Status: DC
  Administered 2018-07-18 – 2018-07-20 (×5): 12.5 mg via ORAL
  Filled 2018-07-18 (×5): qty 1

## 2018-07-18 NOTE — Progress Notes (Signed)
Daily Progress Note   Patient Name: Theresa [name]       Date: 07/18/2018 DOB: 10/25/1960  Age: 57 y.o. MRN#: [id number] Attending Physician: Kayleen Memos, DO Primary Care Physician: Patient, No Pcp Per Admit Date: 07/15/2018  Reason for Consultation/Follow-up: Establishing goals of care and Pain control  Subjective: I saw and examined [name] this afternoon.  We discussed her pain management.  She reports having "some pain" last night but feeling better on regimen today.  She has used oral rescue medication throughout the day, but has not had IV rescue meds since last night (prob as higher dose fentanyl patch has reached steady state.  Length of Stay: 1  Current Medications: Scheduled Meds:  . acetaminophen  1,000 mg Oral Q6H  . dexamethasone  6 mg Oral Q12H  . enoxaparin (LOVENOX) injection  40 mg Subcutaneous Q24H  . fentaNYL  100 mcg Transdermal Q72H  . gabapentin  300 mg Oral 2 times per day  . gabapentin  600 mg Oral QHS  . metoprolol tartrate  12.5 mg Oral BID  . multivitamin with minerals  1 tablet Oral Daily  . polyethylene glycol  17 g Oral Daily  . senna-docusate  2 tablet Oral BID    Continuous Infusions: . sodium chloride 75 mL/hr at 07/18/18 1910    PRN Meds: HYDROmorphone (DILAUDID) injection, HYDROmorphone, ondansetron (ZOFRAN) IV, ondansetron  Physical Exam         General: Alert, awake, in no acute distress.  HEENT: No bruits, no goiter, no JVD Heart: Regular rate and rhythm. No murmur appreciated. Lungs: Good air movement, clear Abdomen: Soft, nontender, nondistended, positive bowel sounds.  Ext: No significant edema Skin: Warm and dry Neuro: Grossly intact, nonfocal.  Vital Signs: BP (!) 150/93 (BP Location: Left Arm)   Pulse 87   Temp  97.8 F (36.6 C) (Oral)   Resp 17   LMP 03/21/2008   SpO2 98%  SpO2: SpO2: 98 % O2 Device: O2 Device: Room Air O2 Flow Rate:    Intake/output summary:   Intake/Output Summary (Last 24 hours) at 07/18/2018 2241 Last data filed at 07/18/2018 0600 Gross per 24 hour  Intake 1082.5 ml  Output -  Net 1082.5 ml   LBM: Last BM Date: 07/16/18 Baseline Weight:   Most recent weight:  Palliative Assessment/Data:    Flowsheet Rows     Most Recent Value  Intake Tab  Referral Department  Hospitalist  Unit at Time of Referral  Oncology Unit  Palliative Care Primary Diagnosis  Cancer  Date Notified  07/15/18  Palliative Care Type  New Palliative care  Reason for referral  Pain, Non-pain Symptom, Clarify Goals of Care  Date of Admission  07/15/18  Date first seen by Palliative Care  07/17/18  # of days Palliative referral response time  2 Day(s)  # of days IP prior to Palliative referral  0  Clinical Assessment  Palliative Performance Scale Score  50%  Psychosocial & Spiritual Assessment  Palliative Care Outcomes  Patient/Family meeting held?  Yes  Who was at the meeting?  Patient, 2 daughters, son, sister  Palliative Care Outcomes  Improved pain interventions, Improved non-pain symptom therapy, Clarified goals of care      Patient Active Problem List   Diagnosis Date Noted  . Intractable pain 07/15/2018  . Goals of care, counseling/discussion 01/04/2018  . Bronchitis 10/15/2017  . Peripheral neuropathy 10/15/2017  . Encounter for antineoplastic immunotherapy 05/12/2017  . Metastatic lung cancer (metastasis from lung to other site) ([place]) 05/03/2017  . Dehydration 10/11/2015  . Nausea with vomiting 10/11/2015  . Chemotherapy induced neutropenia ([place]) 09/30/2015  . Hypokalemia 09/30/2015  . 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 Antineoplastic chemotherapy induced pancytopenia ([place]) 09/18/2015  . Constipation 09/18/2015  . Intractable nausea and vomiting  09/18/2015  . Typical atrial flutter ([place])   . Malignant neoplasm of overlapping sites of right lung ([place])   . Malnutrition of moderate degree 09/17/2015  . Chest pain 09/16/2015  . Tachycardia 09/16/2015  . Pain in the chest   . Encounter for antineoplastic chemotherapy 08/27/2015  . Non-small cell carcinoma of lung, stage 3 ([place]) 08/08/2015  . Lung mass 07/16/2015  . Malignant neoplasm of cervix ([place]) 10/08/2011    Palliative Care Assessment & Plan   Patient Profile: 57 y.o. female  with past medical history of metastatic NSCLC admitted on 07/15/2018 with intractable back pain.  Palliative consulted for pain management and goals of care.  Recommendations/Plan: -Full code/full scope.   - She reports understanding that she has serious disease and may not benefit from further disease modifying therapy, but she needs to discuss this further with Dr. Julien Nordmann. - Pain: Reports improved this afternoon.  Iv usage decreased since increase fentanyl patch to 198mcg and it has reached steady state.  Hydromorphone 8mg  PO every 3 hours as needed.  Continue IV dilaudid as backup if oral route ineffective.  Likely recommend discharge on this regimen.  Goals of Care and Additional Recommendations:  Limitations on Scope of Treatment: Full Scope Treatment  Code Status:    Code Status Orders  (From admission, onward)         Start     Ordered   07/15/18 1318  Full code  Continuous     07/15/18 1317        Code Status History    Date Active Date Inactive Code Status Order ID Comments User Context   11/01/2017 1905 11/04/2017 2106 Full Code [id number]  Phillips Grout, MD Inpatient   10/11/2015 0016 10/13/2015 1627 Full Code [id number]  Vianne Bulls, MD ED   09/16/2015 2239 09/19/2015 1529 Full Code [id number]  Rise Patience, MD Inpatient       Prognosis:   Unable to determine  Discharge Planning:  To Be Determined  Care  plan was discussed with patient, family, RN.  Thank  you for allowing the Palliative Medicine Team to assist in the care of this patient.   Total Time 25 Prolonged Time Billed No      Greater than 50%  of this time was spent counseling and coordinating care related to the above assessment and plan.  Micheline Rough, MD  Please contact Palliative Medicine Team phone at [phone] for questions and concerns.

## 2018-07-18 NOTE — Progress Notes (Signed)
PROGRESS NOTE  Theresa [name] CBJ:[id number] DOB: 1961-04-12 DOA: 07/15/2018 PCP: Patient, No Pcp Per  HPI/Recap of past 24 hours: Patient is a 57 year old female with past medical history significant for metastatic non-small cell lung cancer who finished 6 cycles of gemcitabine about a month and a half ago, follows with Dr. Julien Nordmann, was directly admitted from cancer center for the management of intractable pain in the back of her chest.  She has a large tumor eroding through her posterior right chest wall and it can be felt on her right scapular region.  Complaint of nausea, constipation.  Plan is to involve palliative care for goals of care discussion and pain management.  07/17/18: Patient seen and examined at bedside.  Reports significant pain in her right scapula at the site of the tumor.  IV pain medicine added.  Palliative care team consulted and following due to poor prognosis.  07/18/2018: Patient seen and examined at bedside.  No acute events overnight.  Still reports constant pain in her right scapula 7-8 out of 10 in severity and dull.  PT assessed this morning and recommended SNF.  Assessment/Plan: Active Problems:   Metastatic lung cancer (metastasis from lung to other site) [facility])   Intractable pain  Intractable back pain at the region of her right scapula Continue pain management Continue IV pain medications as needed Continue bowel regimen Palliative care team consulted for symptoms management and goals of care.  Appreciate recommendations.  Metastatic lung cancer With mets to tip of right transverse process of T9 vertebrae Follow-up with oncology outpatient Dr. Earlie Server Post chemotherapy and radiation Palliative care consult for goals of care  Opiate-induced constipation Continue Senokot 2 tablets twice daily, Dulcolax, lactulose Continue MiraLAX daily  Ambulatory dysfunction/physical debility PT assessed and recommended SNF Social worker consulted for SNF  placement Fall precautions  Hypertension  Blood pressures well controlled  Chemotherapy-induced neuropathy Continue gabapentin  Macrocytic anemia Hemoglobin 8.5 with baseline hemoglobin of 9 MCV 101 Repeat labs in the morning  Hypertension Resume home metoprolol  Risks: High risk for decompensation due to severe back pain requiring IV pain medications in the setting of metastasized lung cancer, multiple comorbidities, and advanced age.  She requires another midnight for titration of pain medications.   Code Status: Full  Family Communication: None at bedside  Disposition Plan: Possibly SNF.  Awaiting bed placement.  Consultants:  Palliative care team  Procedures:  None  Antimicrobials:  None  DVT prophylaxis: Subcu Lovenox   Objective: Vitals:   07/17/18 0512 07/17/18 1306 07/17/18 2057 07/18/18 0408  BP: 129/87 (!) 147/84 (!) 149/89 (!) 143/91  Pulse: 84 88 86 87  Resp: 17 16 17 12   Temp: 98 F (36.7 C) 97.6 F (36.4 C) 98.1 F (36.7 C) 97.7 F (36.5 C)  TempSrc: Oral Oral Oral Oral  SpO2: 96% 98% 100% 99%    Intake/Output Summary (Last 24 hours) at 07/18/2018 1504 Last data filed at 07/18/2018 0600 Gross per 24 hour  Intake 1082.5 ml  Output -  Net 1082.5 ml   There were no vitals filed for this visit.  Exam:  . General: 57 y.o. year-old female well-developed well-nourished in mild discomfort due to right scapular pain.  Alert and oriented x3. . Cardiovascular: Regular rate and rhythm with no rubs or gallops.  No JVD or thyromegaly noted. Marland Kitchen Respiratory: Clear to auscultation with no wheezes or rales. Good inspiratory effort. . Abdomen: Soft nontender nondistended with normal bowel sounds x4 quadrants. . Musculoskeletal: No lower  extremity edema. 2/4 pulses in all 4 extremities.  Mass visualized and palpated at the right scapula. . Skin: No ulcerative lesions noted or rashes . Psychiatry: Mood is appropriate for condition and  setting   Data Reviewed: CBC: Recent Labs  Lab 07/15/18 1002 07/16/18 0512  WBC 8.7 6.4  NEUTROABS 6.9  --   HGB 9.2* 8.5*  HCT 28.1* 27.5*  MCV 98.6 101.5*  PLT 225 641   Basic Metabolic Panel: Recent Labs  Lab 07/15/18 1002 07/16/18 0512 07/17/18 0600  NA 136 138 143  K 3.0* 3.8 4.0  CL 99 107 111  CO2 24 24 24   GLUCOSE 117* 194* 145*  BUN 6 8 8   CREATININE 0.64 0.61 0.49  CALCIUM 9.8 9.2 9.1   GFR: CrCl cannot be calculated (Unknown ideal weight.). Liver Function Tests: Recent Labs  Lab 07/15/18 1002  AST 49*  ALT 10  ALKPHOS 80  BILITOT 0.8  PROT 8.3*  ALBUMIN 2.9*   No results for input(s): LIPASE, AMYLASE in the last 168 hours. No results for input(s): AMMONIA in the last 168 hours. Coagulation Profile: No results for input(s): INR, PROTIME in the last 168 hours. Cardiac Enzymes: No results for input(s): CKTOTAL, CKMB, CKMBINDEX, TROPONINI in the last 168 hours. BNP (last 3 results) No results for input(s): PROBNP in the last 8760 hours. HbA1C: Recent Labs    07/17/18 1955  HGBA1C 5.1   CBG: No results for input(s): GLUCAP in the last 168 hours. Lipid Profile: No results for input(s): CHOL, HDL, LDLCALC, TRIG, CHOLHDL, LDLDIRECT in the last 72 hours. Thyroid Function Tests: Recent Labs    07/17/18 1955  TSH 2.410   Anemia Panel: Recent Labs    07/17/18 1955  VITAMINB12 863  FERRITIN 1,402*  TIBC 133*  IRON 93  RETICCTPCT 2.3   Urine analysis:    Component Value Date/Time   COLORURINE YELLOW 11/02/2017 0808   APPEARANCEUR CLEAR 11/02/2017 0808   LABSPEC 1.012 11/02/2017 0808   PHURINE 6.0 11/02/2017 0808   GLUCOSEU NEGATIVE 11/02/2017 0808   HGBUR NEGATIVE 11/02/2017 0808   BILIRUBINUR NEGATIVE 11/02/2017 0808   KETONESUR NEGATIVE 11/02/2017 0808   PROTEINUR NEGATIVE 11/02/2017 0808   UROBILINOGEN 1.0 07/15/2015 1144   NITRITE NEGATIVE 11/02/2017 0808   LEUKOCYTESUR NEGATIVE 11/02/2017 0808   Sepsis  Labs: @LABRCNTIP (procalcitonin:4,lacticidven:4)  )No results found for this or any previous visit (from the past 240 hour(s)).    Studies: No results found.  Scheduled Meds: . acetaminophen  1,000 mg Oral Q6H  . dexamethasone  6 mg Oral Q12H  . enoxaparin (LOVENOX) injection  40 mg Subcutaneous Q24H  . fentaNYL  100 mcg Transdermal Q72H  . gabapentin  300 mg Oral 2 times per day  . gabapentin  600 mg Oral QHS  . polyethylene glycol  17 g Oral Daily  . senna-docusate  2 tablet Oral BID    Continuous Infusions: . sodium chloride 75 mL/hr at 07/18/18 0508     LOS: 1 day     Kayleen Memos, MD [facility] Pager [phone]  If 7PM-7AM, please contact night-coverage [email or web address] Password [email or web address] 07/18/2018, 3:04 PM

## 2018-07-18 NOTE — Evaluation (Signed)
Physical Therapy Evaluation Patient Details Name: Theresa [name] MRN: [id number] DOB: 03/28/1961 Today's Date: 07/18/2018   History of Present Illness  57 yo female admitted with intractable pain. 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 She was able to walk ~70 feet with a RW. Pt presents with weakness, decreased activity tolerance, and impaired gait and balance. Pt is very unsteady and at high risk for falls when mobilizing. Discussed d/c plan-pt is open to ST rehab at SNF to improve strength/balance and maximize independence. Will follow and progress activity as tolerated.     Follow Up Recommendations SNF    Equipment Recommendations  None recommended by PT    Recommendations for Other Services       Precautions / Restrictions Precautions Precautions: Fall Restrictions Weight Bearing Restrictions: No      Mobility  Bed Mobility Overal bed mobility: Needs Assistance Bed Mobility: Supine to Sit     Supine to sit: Min guard     General bed mobility comments: close guard for safety. Increased time.   Transfers Overall transfer level: Needs assistance Equipment used: Rolling walker (2 wheeled) Transfers: Sit to/from Stand Sit to Stand: From elevated surface;Mod assist         General transfer comment: Assist to rise/power up, stabilize, control descent. VCs safety, technique, hand placement. Increased time.  Ambulation/Gait Ambulation/Gait assistance: Min assist Gait Distance (Feet): 70 Feet Assistive device: Rolling walker (2 wheeled) Gait Pattern/deviations: Step-through pattern;Decreased dorsiflexion - right     General Gait Details: Assist to stabilize pt throughout distance. Slow gait speed. Very unsteady and at risk for falls. Pt fatigues easily.  Stairs            Wheelchair Mobility    Modified Rankin (Stroke Patients Only)       Balance Overall balance assessment: Needs  assistance         Standing balance support: Bilateral upper extremity supported Standing balance-Leahy Scale: Poor                               Pertinent Vitals/Pain Pain Assessment: Faces Faces Pain Scale: Hurts even more Pain Location: back, R side/chest Pain Descriptors / Indicators: Aching;Sore Pain Intervention(s): Monitored during session;Repositioned    Home Living Family/patient expects to be discharged to:: Private residence Living Arrangements: Children(son, fiance) Available Help at Discharge: Family;Available PRN/intermittently Type of Home: House Home Access: Level entry     Home Layout: One level Home Equipment: Bedside commode;Walker - 2 wheels      Prior Function Level of Independence: Needs assistance   Gait / Transfers Assistance Needed: assist for ambulation  ADL's / Homemaking Assistance Needed: assist for bathing  Comments: pt has assist for mobility at all times due to fall risk     Hand Dominance        Extremity/Trunk Assessment   Upper Extremity Assessment Upper Extremity Assessment: Overall WFL for tasks assessed    Lower Extremity Assessment Lower Extremity Assessment: Generalized weakness;RLE deficits/detail RLE Deficits / Details: DF 2-/5    Cervical / Trunk Assessment Cervical / Trunk Assessment: Normal  Communication   Communication: No difficulties  Cognition Arousal/Alertness: Awake/alert Behavior During Therapy: WFL for tasks assessed/performed Overall Cognitive Status: Within Functional Limits for tasks assessed                                          General Comments      Exercises     Assessment/Plan    PT Assessment Patient needs continued PT services  PT Problem List Decreased strength;Decreased balance;Decreased mobility;Decreased activity tolerance;Decreased knowledge of use of DME       PT Treatment Interventions DME instruction;Gait training;Functional mobility  training;Therapeutic activities;Balance training;Patient/family education;Therapeutic exercise    PT Goals (Current goals can be found in the Care Plan section)  Acute Rehab PT Goals Patient Stated Goal: to get stronger PT Goal Formulation: With patient Time For Goal Achievement: 08/01/18 Potential to Achieve Goals: Fair    Frequency Min 3X/week   Barriers to discharge        Co-evaluation               AM-PAC PT "6 Clicks" Daily Activity  Outcome Measure Difficulty turning over in bed (including adjusting bedclothes, sheets and blankets)?: A Lot Difficulty moving from lying on back to sitting on the side of the bed? : A Lot Difficulty sitting down on and standing up from a chair with arms (e.g., wheelchair, bedside commode, etc,.)?: Unable Help needed moving to and from a bed to chair (including a wheelchair)?: A Lot Help needed walking in hospital room?: A Little Help needed climbing 3-5 steps with a railing? : Total 6 Click Score: 11    End of Session Equipment Utilized During Treatment: Gait belt Activity Tolerance: Patient tolerated treatment well Patient left: in chair;with call bell/phone within reach   PT Visit Diagnosis: Muscle weakness (generalized) (M62.81);Unsteadiness on feet (R26.81);Difficulty in walking, not elsewhere classified (R26.2);History of falling (Z91.81)    Time: 1000-1028 PT Time Calculation (min) (ACUTE ONLY): 28 min   Charges:   PT Evaluation $PT Eval Moderate Complexity: 1 Mod PT Treatments $Gait Training: 8-22 mins         Weston Anna, PT Acute Rehabilitation Services Pager: [phone] Office: [phone]

## 2018-07-19 DIAGNOSIS — C3490 Malignant neoplasm of unspecified part of unspecified bronchus or lung: Secondary | ICD-10-CM

## 2018-07-19 DIAGNOSIS — R52 Pain, unspecified: Secondary | ICD-10-CM

## 2018-07-19 DIAGNOSIS — Z515 Encounter for palliative care: Secondary | ICD-10-CM

## 2018-07-19 LAB — BASIC METABOLIC PANEL
Anion gap: 8 (ref 5–15)
BUN: 9 mg/dL (ref 6–20)
CALCIUM: 9 mg/dL (ref 8.9–10.3)
CHLORIDE: 104 mmol/L (ref 98–111)
CO2: 26 mmol/L (ref 22–32)
CREATININE: 0.51 mg/dL (ref 0.44–1.00)
GFR calc Af Amer: >60 mL/min (ref >60)
GFR calc non Af Amer: >60 mL/min (ref >60)
GLUCOSE: 135 mg/dL — AB (ref 70–99)
Potassium: 3.5 mmol/L (ref 3.5–5.1)
Sodium: 138 mmol/L (ref 135–145)

## 2018-07-19 LAB — CBC
HEMATOCRIT: 27.2 % — AB (ref 36.0–46.0)
HEMOGLOBIN: 8.7 g/dL — AB (ref 12.0–15.0)
MCH: 32.1 pg (ref 26.0–34.0)
MCHC: 32 g/dL (ref 30.0–36.0)
MCV: 100.4 fL — AB (ref 80.0–100.0)
Platelets: 228 10*3/uL (ref 150–400)
RBC: 2.71 MIL/uL — ABNORMAL LOW (ref 3.87–5.11)
RDW: 16.1 % — AB (ref 11.5–15.5)
WBC: 10.2 10*3/uL (ref 4.0–10.5)
nRBC: 0.3 % — ABNORMAL HIGH (ref 0.0–0.2)

## 2018-07-19 MED ORDER — ENSURE ENLIVE PO LIQD: 237.0000 mL | Freq: Two times a day (BID) | ORAL | Status: DC | PRN

## 2018-07-19 MED ORDER — LIDOCAINE 5 % EX PTCH
1.0000 | MEDICATED_PATCH | CUTANEOUS | Status: DC
  Administered 2018-07-19 – 2018-07-20 (×2): 1 via TRANSDERMAL
  Filled 2018-07-19 (×2): qty 1

## 2018-07-19 NOTE — Progress Notes (Addendum)
Daily Progress Note   Patient Name: Theresa [name]       Date: 07/19/2018 DOB: November 17, 1960  Age: 57 y.o. MRN#: [id number] Attending Physician: Kayleen Memos, DO Primary Care Physician: Patient, No Pcp Per Admit Date: 07/15/2018  Reason for Consultation/Follow-up: Establishing goals of care and Pain control  Subjective: Theresa [name] is awake and alert sitting up in bed. She complains of ongoing pain on the right posterior chest wall where she has a large tumor. She did not rest well overnight. She states that she has right lower extremity weakness. She is afraid of using the bedside commode by herself because she cannot bear much weight on right lower extremity.  We discussed her pain management.  See below   Brief life review performed. 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 She lives at home in [place] with her son. Additionally, her fianc of 10 years also lives with her. She has a mother and a sister who are also involved in care giving. The patient talks about the stressors of her serious illness, her ongoing lung cancer. She states that she has been dealing with this for the past 2-3 years. She states that she thought that she had dealt with it but that it snuck up on her again. She states that prior to her cancer diagnosis, she was taking care of her elderly mother, but now, her mother is helping to take care of her.  She is originally from [place], she talks about two of her tattoos, bearing the [employer] on her neck.   She states, "you never know when it's going to be your turn." She realizes she is up against a serious, potentially incurable illness. However, wishes to continue with full code/full scope for now. She is  hopeful for skilled nursing facility rehabilitation discharge. She really does not want to go home as she is by herself for several hours during the day and still feels like she has a lot of lower extremity weakness especially on the right side.  Length of Stay: 2  Current Medications: Scheduled Meds:  . acetaminophen  1,000 mg Oral Q6H  . dexamethasone  6 mg Oral Q12H  . enoxaparin (LOVENOX) injection  40 mg Subcutaneous Q24H  . fentaNYL  100  mcg Transdermal Q72H  . gabapentin  300 mg Oral 2 times per day  . gabapentin  600 mg Oral QHS  . lidocaine  1 patch Transdermal Q24H  . metoprolol tartrate  12.5 mg Oral BID  . multivitamin with minerals  1 tablet Oral Daily  . polyethylene glycol  17 g Oral Daily  . senna-docusate  2 tablet Oral BID    Continuous Infusions: . sodium chloride 75 mL/hr at 07/19/18 0837    PRN Meds: HYDROmorphone (DILAUDID) injection, HYDROmorphone, ondansetron (ZOFRAN) IV, ondansetron  Physical Exam         General: Alert, awake, in no acute distress.  HEENT: No bruits, no goiter, no JVD Heart: Regular rate and rhythm. No murmur appreciated. Lungs: Good air movement, clear Abdomen: Soft, nontender, nondistended, positive bowel sounds.  Ext: No significant edema Skin: Warm and dry Neuro: Grossly intact, nonfocal.  Right posterior chest wall inspected. Patient has large firm tumor on upper back of her right chest. She has pain right lateral side of the chest wall as well.  Vital Signs: BP 131/85 (BP Location: Right Arm)   Pulse 86   Temp 97.8 F (36.6 C) (Oral)   Resp 18   LMP 03/21/2008   SpO2 97%  SpO2: SpO2: 97 % O2 Device: O2 Device: Room Air O2 Flow Rate:    Intake/output summary:  No intake or output data in the 24 hours ending 07/19/18 0925 LBM: Last BM Date: 07/16/18 Baseline Weight:   Most recent weight:         Palliative Assessment/Data:    Flowsheet Rows     Most Recent Value  Intake Tab  Referral Department   Hospitalist  Unit at Time of Referral  Oncology Unit  Palliative Care Primary Diagnosis  Cancer  Date Notified  07/15/18  Palliative Care Type  New Palliative care  Reason for referral  Pain, Non-pain Symptom, Clarify Goals of Care  Date of Admission  07/15/18  Date first seen by Palliative Care  07/17/18  # of days Palliative referral response time  2 Day(s)  # of days IP prior to Palliative referral  0  Clinical Assessment  Palliative Performance Scale Score  50%  Psychosocial & Spiritual Assessment  Palliative Care Outcomes  Patient/Family meeting held?  Yes  Who was at the meeting?  Patient, 2 daughters, son, sister  Palliative Care Outcomes  Improved pain interventions, Improved non-pain symptom therapy, Clarified goals of care      Patient Active Problem List   Diagnosis Date Noted  . Intractable pain 07/15/2018  . Goals of care, counseling/discussion 01/04/2018  . Bronchitis 10/15/2017  . Peripheral neuropathy 10/15/2017  . Encounter for antineoplastic immunotherapy 05/12/2017  . Metastatic lung cancer (metastasis from lung to other site) ([place]) 05/03/2017  . Dehydration 10/11/2015  . Nausea with vomiting 10/11/2015  . Chemotherapy induced neutropenia ([place]) 09/30/2015  . Hypokalemia 09/30/2015  . 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 Antineoplastic chemotherapy induced pancytopenia ([place]) 09/18/2015  . Constipation 09/18/2015  . Intractable nausea and vomiting 09/18/2015  . Typical atrial flutter ([place])   . Malignant neoplasm of overlapping sites of right lung ([place])   . Malnutrition of moderate degree 09/17/2015  . Chest pain 09/16/2015  . Tachycardia 09/16/2015  . Pain in the chest   . Encounter for antineoplastic chemotherapy 08/27/2015  . Non-small cell carcinoma of lung, stage 3 ([place]) 08/08/2015  . Lung mass 07/16/2015  . Malignant neoplasm of cervix ([place]) 10/08/2011    Palliative Care  Assessment & Plan   Patient Profile: 57 y.o. female  with past  medical history of metastatic NSCLC admitted on 07/15/2018 with intractable back pain.  Palliative consulted for pain management and goals of care.  Recommendations/Plan: -Full code/full scope.   - She reports understanding that she has serious disease and may not benefit from further disease modifying therapy, but she needs to discuss this further with Dr. Julien Nordmann. She is appreciative of the care provided by Dr. Rogue Jury. She states that both herself and her family are struggling with her serious illness and are fully aware of the possibility of this being non-curable in nature.  - Pain: we discussed about resuming transdermal fentanyl patch at 100 g. Appreciate RN Bianca's assistance. Discussed with patient about her pain regimen. She has taken 2 doses of 8 mg by mouth Dilaudid in the past 24 hours, she is on Tylenol thousand milligrams. She has required 2 doses of 1 mg IV Dilaudid each in the past 24 hours for rescue. Additionally, we will attempt ice or heat application, we will attempt Lidoderm patch application and continue to monitor patient's pain management needs.   Goals of Care and Additional Recommendations:  Limitations on Scope of Treatment: Full Scope Treatment  Code Status:    Code Status Orders  (From admission, onward)         Start     Ordered   07/15/18 1318  Full code  Continuous     07/15/18 1317        Code Status History    Date Active Date Inactive Code Status Order ID Comments User Context   11/01/2017 1905 11/04/2017 2106 Full Code [id number]  Phillips Grout, MD Inpatient   10/11/2015 0016 10/13/2015 1627 Full Code [id number]  Vianne Bulls, MD ED   09/16/2015 2239 09/19/2015 1529 Full Code [id number]  Rise Patience, MD Inpatient       Prognosis:   Unable to determine  Discharge Planning:  Recommend skilled nursing facility with rehabilitation attempt and palliative care following  Care plan was discussed with patient,RN.  Thank you for  allowing the Palliative Medicine Team to assist in the care of this patient.   Total Time 35 Prolonged Time Billed No      Greater than 50%  of this time was spent counseling and coordinating care related to the above assessment and plan.  Loistine Chance, MD [phone]  Please contact Palliative Medicine Team phone at [phone] for questions and concerns.

## 2018-07-19 NOTE — NC FL2 (Signed)
[place] LEVEL OF CARE SCREENING TOOL     IDENTIFICATION  Patient Name: Theresa [name] Birthdate: [birth year] Sex: female Admission Date (Current Location): 07/15/2018  [facility] and [place] Number:  Herbalist and Address:  [facility],  [place], [place]      Provider Number: [id number]  Attending Physician Name and Address:  Kayleen Memos, DO  Relative Name and Phone Number:       Current Level of Care: Hospital Recommended Level of Care: [place] Prior Approval Number:    Date Approved/Denied:   PASRR Number: [id number]  Discharge Plan: SNF    Current Diagnoses: Patient Active Problem List   Diagnosis Date Noted  . Palliative care by specialist   . Intractable pain 07/15/2018  . Goals of care, counseling/discussion 01/04/2018  . Bronchitis 10/15/2017  . Peripheral neuropathy 10/15/2017  . Encounter for antineoplastic immunotherapy 05/12/2017  . Metastatic lung cancer (metastasis from lung to other site) ([place]) 05/03/2017  . Dehydration 10/11/2015  . Nausea with vomiting 10/11/2015  . Chemotherapy induced neutropenia ([place]) 09/30/2015  . Hypokalemia 09/30/2015  . 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 Antineoplastic chemotherapy induced pancytopenia ([place]) 09/18/2015  . Constipation 09/18/2015  . Intractable nausea and vomiting 09/18/2015  . Typical atrial flutter ([place])   . Malignant neoplasm of overlapping sites of right lung ([place])   . Malnutrition of moderate degree 09/17/2015  . Chest pain 09/16/2015  . Tachycardia 09/16/2015  . Pain in the chest   . Encounter for antineoplastic chemotherapy 08/27/2015  . Non-small cell carcinoma of lung, stage 3 ([place]) 08/08/2015  . Lung mass 07/16/2015  . Malignant neoplasm of cervix ([place]) 10/08/2011    Orientation RESPIRATION BLADDER Height & Weight     Self, Time, Situation, Place  Normal Continent Weight:   Height:     BEHAVIORAL  SYMPTOMS/MOOD NEUROLOGICAL BOWEL NUTRITION STATUS      Continent Diet(regular)  AMBULATORY STATUS COMMUNICATION OF NEEDS Skin   Limited Assist Verbally Normal                       Personal Care Assistance Level of Assistance  Bathing, Feeding, Dressing Bathing Assistance: Limited assistance Feeding assistance: Independent Dressing Assistance: Limited assistance     Functional Limitations Info  Sight, Hearing, Speech Sight Info: Adequate Hearing Info: Adequate Speech Info: Adequate    SPECIAL CARE FACTORS FREQUENCY  PT (By licensed PT), OT (By licensed OT)     PT Frequency: 5x OT Frequency: 5x            Contractures Contractures Info: Not present    Additional Factors Info  Code Status, Allergies Code Status Info: full code Allergies Info: nka           Current Medications (07/19/2018):  This is the current hospital active medication list Current Facility-Administered Medications  Medication Dose Route Frequency Provider Last Rate Last Dose  . 0.45 % sodium chloride infusion   Intravenous Continuous Kayleen Memos, DO 75 mL/hr at 07/19/18 7616    . acetaminophen (TYLENOL) tablet 1,000 mg  1,000 mg Oral Q6H Adhikari, Amrit, MD   1,000 mg at 07/19/18 1316  . dexamethasone (DECADRON) tablet 6 mg  6 mg Oral Q12H Georgette Shell, MD   6 mg at 07/19/18 1019  . enoxaparin (LOVENOX) injection 40 mg  40 mg Subcutaneous Q24H Georgette Shell, MD   40 mg at 07/18/18 2250  . feeding  supplement (ENSURE ENLIVE) (ENSURE ENLIVE) liquid 237 mL  237 mL Oral BID BM & HS PRN Irene Pap N, DO      . fentaNYL (DURAGESIC - dosed mcg/hr) 100 mcg  100 mcg Transdermal Q72H Micheline Rough, MD   100 mcg at 07/19/18 0844  . gabapentin (NEURONTIN) capsule 300 mg  300 mg Oral 2 times per day Georgette Shell, MD   300 mg at 07/19/18 1030  . gabapentin (NEURONTIN) capsule 600 mg  600 mg Oral QHS Georgette Shell, MD   600 mg at 07/18/18 2248  . HYDROmorphone (DILAUDID)  injection 1 mg  1 mg Intravenous Q3H PRN Micheline Rough, MD   1 mg at 07/19/18 1046  . HYDROmorphone (DILAUDID) tablet 8 mg  8 mg Oral Q3H PRN Micheline Rough, MD   8 mg at 07/19/18 0811  . lidocaine (LIDODERM) 5 % 1 patch  1 patch Transdermal Q24H Loistine Chance, MD   1 patch at 07/19/18 1317  . metoprolol tartrate (LOPRESSOR) tablet 12.5 mg  12.5 mg Oral BID Irene Pap N, DO   12.5 mg at 07/19/18 1019  . multivitamin with minerals tablet 1 tablet  1 tablet Oral Daily Irene Pap N, DO   1 tablet at 07/19/18 1017  . ondansetron (ZOFRAN) injection 4 mg  4 mg Intravenous Q6H PRN Georgette Shell, MD      . ondansetron (ZOFRAN-ODT) disintegrating tablet 4 mg  4 mg Oral Q8H PRN Georgette Shell, MD      . polyethylene glycol (MIRALAX / GLYCOLAX) packet 17 g  17 g Oral Daily Irene Pap N, DO   17 g at 07/19/18 1017  . senna-docusate (Senokot-S) tablet 2 tablet  2 tablet Oral BID Kayleen Memos, DO   2 tablet at 07/19/18 1017     Discharge Medications: Please see discharge summary for a list of discharge medications.  Relevant Imaging Results:  Relevant Lab Results:   Additional Information SS# [id number]  Nila Nephew, LCSW

## 2018-07-19 NOTE — Progress Notes (Signed)
Applied new Fentanyl Patch per Loistine Chance, MD. Patient previously had 75 mcg patch that was removed 10/28 & now has 100 mcg patch.

## 2018-07-19 NOTE — Progress Notes (Signed)
PROGRESS NOTE  IDALEE FOXWORTHY QZR:[id number] DOB: Dec 24, 1960 DOA: 07/15/2018 PCP: Patient, No Pcp Per  HPI/Recap of past 24 hours: Patient is a 57 year old female with past medical history significant for metastatic non-small cell lung cancer who finished 6 cycles of gemcitabine about a month and a half ago, follows with Dr. Julien Nordmann, was directly admitted from cancer center for the management of intractable pain in the back of her chest.  She has a large tumor eroding through her posterior right chest wall and it can be felt on her right scapular region.    Hospital course complicated by severe pain. Palliative care team consulted and following. Pain is improving on new pain med regimen. PT assessed and recommended SNF. Awaiting bed placement.  07/19/2018: Patient seen and examined at bedside.  No acute events overnight.  Reports persistent generalized weakness.  She is looking forward to short-term rehab to continue physical therapy.   Assessment/Plan: Active Problems:   Metastatic lung cancer (metastasis from lung to other site) [facility])   Intractable pain   Palliative care by specialist  Intractable back pain at the region of her right scapula Improving Continue current pain management Continue bowel regimen Palliative care team following for symptoms management and goals of care.  Appreciate recommendations.  Metastatic lung cancer With mets to tip of right transverse process of T9 vertebrae Follow-up with oncology outpatient Dr. Earlie Server Post chemotherapy and radiation Palliative care consult for goals of care Keep follow-up appointment with Dr. Earlie Server  Opiate-induced constipation, improving Continue Senokot 2 tablets twice daily, Dulcolax, lactulose Continue MiraLAX daily  Ambulatory dysfunction/physical debility PT assessed and recommended SNF Social worker consulted for SNF placement Fall precautions Awaiting SNF placement  Hypertension  Blood pressures well  controlled  Chemotherapy-induced neuropathy Continue gabapentin  Macrocytic anemia Hemoglobin 8.5 with baseline hemoglobin of 9 MCV 101 Hemoglobin stable  Hypertension Blood pressure stable Continue home metoprolol    Code Status: Full  Family Communication: None at bedside  Disposition Plan: Possibly SNF.  Awaiting bed placement.  Consultants:  Palliative care team  Procedures:  None  Antimicrobials:  None  DVT prophylaxis: Subcu Lovenox   Objective: Vitals:   07/18/18 2128 07/19/18 0454 07/19/18 0454 07/19/18 1437  BP: (!) 150/93 131/85 131/85 122/83  Pulse: 87 86 86 87  Resp: 17 18 18 16   Temp: 97.8 F (36.6 C) 97.8 F (36.6 C) 97.8 F (36.6 C) 97.8 F (36.6 C)  TempSrc: Oral Oral Oral Oral  SpO2: 98% 97% 97% 100%   No intake or output data in the 24 hours ending 07/19/18 1543 There were no vitals filed for this visit.  Exam:  . General: 57 y.o. year-old female blood well-nourished no acute distress.  Alert and oriented x3.   . Cardiovascular: Regular rate and rhythm with no rubs or gallops.  No JVD or thyromegaly noted.   Marland Kitchen Respiratory: To auscultation with no wheezes or rales.  Good inspiratory effort.  . Abdomen: Soft nontender nondistended with normal bowel sounds x4 quadrants. . Musculoskeletal: No lower extremity edema. 2/4 pulses in all 4 extremities.  Mass visualized and palpated at the right scapula. . Skin: No ulcerative lesions noted or rashes . Psychiatry: Mood is appropriate for condition and setting   Data Reviewed: CBC: Recent Labs  Lab 07/15/18 1002 07/16/18 0512 07/19/18 0535  WBC 8.7 6.4 10.2  NEUTROABS 6.9  --   --   HGB 9.2* 8.5* 8.7*  HCT 28.1* 27.5* 27.2*  MCV 98.6 101.5* 100.4*  PLT  225 215 628   Basic Metabolic Panel: Recent Labs  Lab 07/15/18 1002 07/16/18 0512 07/17/18 0600 07/19/18 0535  NA 136 138 143 138  K 3.0* 3.8 4.0 3.5  CL 99 107 111 104  CO2 24 24 24 26   GLUCOSE 117* 194* 145* 135*  BUN 6  8 8 9   CREATININE 0.64 0.61 0.49 0.51  CALCIUM 9.8 9.2 9.1 9.0   GFR: CrCl cannot be calculated (Unknown ideal weight.). Liver Function Tests: Recent Labs  Lab 07/15/18 1002  AST 49*  ALT 10  ALKPHOS 80  BILITOT 0.8  PROT 8.3*  ALBUMIN 2.9*   No results for input(s): LIPASE, AMYLASE in the last 168 hours. No results for input(s): AMMONIA in the last 168 hours. Coagulation Profile: No results for input(s): INR, PROTIME in the last 168 hours. Cardiac Enzymes: No results for input(s): CKTOTAL, CKMB, CKMBINDEX, TROPONINI in the last 168 hours. BNP (last 3 results) No results for input(s): PROBNP in the last 8760 hours. HbA1C: Recent Labs    07/17/18 1955  HGBA1C 5.1   CBG: No results for input(s): GLUCAP in the last 168 hours. Lipid Profile: No results for input(s): CHOL, HDL, LDLCALC, TRIG, CHOLHDL, LDLDIRECT in the last 72 hours. Thyroid Function Tests: Recent Labs    07/17/18 1955  TSH 2.410   Anemia Panel: Recent Labs    07/17/18 1955  VITAMINB12 863  FERRITIN 1,402*  TIBC 133*  IRON 93  RETICCTPCT 2.3   Urine analysis:    Component Value Date/Time   COLORURINE YELLOW 11/02/2017 0808   APPEARANCEUR CLEAR 11/02/2017 0808   LABSPEC 1.012 11/02/2017 0808   PHURINE 6.0 11/02/2017 0808   GLUCOSEU NEGATIVE 11/02/2017 0808   HGBUR NEGATIVE 11/02/2017 0808   BILIRUBINUR NEGATIVE 11/02/2017 0808   KETONESUR NEGATIVE 11/02/2017 0808   PROTEINUR NEGATIVE 11/02/2017 0808   UROBILINOGEN 1.0 07/15/2015 1144   NITRITE NEGATIVE 11/02/2017 0808   LEUKOCYTESUR NEGATIVE 11/02/2017 0808   Sepsis Labs: @LABRCNTIP (procalcitonin:4,lacticidven:4)  )No results found for this or any previous visit (from the past 240 hour(s)).    Studies: No results found.  Scheduled Meds: . acetaminophen  1,000 mg Oral Q6H  . dexamethasone  6 mg Oral Q12H  . enoxaparin (LOVENOX) injection  40 mg Subcutaneous Q24H  . fentaNYL  100 mcg Transdermal Q72H  . gabapentin  300 mg Oral 2  times per day  . gabapentin  600 mg Oral QHS  . lidocaine  1 patch Transdermal Q24H  . metoprolol tartrate  12.5 mg Oral BID  . multivitamin with minerals  1 tablet Oral Daily  . polyethylene glycol  17 g Oral Daily  . senna-docusate  2 tablet Oral BID    Continuous Infusions: . sodium chloride 75 mL/hr at 07/19/18 0837     LOS: 2 days     Kayleen Memos, MD [facility] Pager [phone]  If 7PM-7AM, please contact night-coverage [email or web address] Password [facility] 07/19/2018, 3:43 PM

## 2018-07-19 NOTE — Clinical Social Work Note (Signed)
Clinical Social Work Assessment  Patient Details  Name: Theresa [name] MRN: [id number] Date of Birth: July 02, 1961  Date of referral:  07/19/18               Reason for consult:  Facility Placement                Permission sought to share information with:  Family Supports Permission granted to share information::  Yes, Verbal Permission Granted  Name::     sister Barbaraann Share, daughter [name], son  Agency::     Relationship::     Contact Information:     Housing/Transportation Living arrangements for the past 2 months:  Single Family Home Source of Information:  Patient Patient Interpreter Needed:  None Criminal Activity/Legal Involvement Pertinent to Current Situation/Hospitalization:  No - Comment as needed Significant Relationships:  Significant Other, Adult Children, Siblings, Friend Lives with:  Self, Adult Children Do you feel safe going back to the place where you live?  Yes Need for family participation in patient care:  No (Coment)  Care giving concerns:  Pt admitted from home where she resides with her son, other family members in area also make up strong support system. Pt with increasing weakness especially reports on her right side. States "my foot drags." States she has metastatic lung Ca post chemo/radiation. Admitted with intractable pain- cancer related. Pt states she is accepting that her cancer may not be curable. Unsure whether to pursue hospice/palliative route yet, wants time to discuss with family and to hear all options from her oncology team. States she plans to follow up outpatient at [facility] where she has been patient of Dr Julien Nordmann. Pt interested in trying to rehabilitate and be able to return home more functional   Social Worker assessment / plan:  CSW consulted to assist with disposition- SNF recommended for therapy.  Met with pt and her son at bedside. 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  Feels this meets her goals- "be able to try to see wheat I can do in rehab before I make any long term decisions about treatment. I'd like to go back home and be able to manage there." CSW completed FL2, obtained approved PASRR, and made referrals to area facilities- will follow up with bed offers.    Employment status:  Disabled (Comment on whether or not currently receiving Disability)(yes) Insurance information:  Medicare PT Recommendations:  [place] / Referral to community resources:  [place]  Patient/Family's Response to care:  Engaged and appreciative  Patient/Family's Understanding of and Emotional Response to Diagnosis, Current Treatment, and Prognosis:  Pt shows very good understanding of factors driving current care and, while she did not discuss prognosis at length with CSW, acknowledged she is at a point of starting to have to consider when she may have to make end of life decisions if there is no further curative treatment option for her. Emotionally very accepting and positive throughout interaction, focused on short term goals as they will lead to long term decisions  Emotional Assessment Appearance:  Appears stated age Attitude/Demeanor/Rapport:  Engaged Affect (typically observed):  Accepting, Calm, Hopeful, Pleasant Orientation:  Oriented to Self, Oriented to Place, Oriented to  Time, Oriented to Situation Alcohol / Substance use:  Not Applicable Psych involvement (Current and /or in the community):  No (Comment)  Discharge Needs  Concerns to be addressed:  Discharge Planning Concerns,  Decision making concerns Readmission within the last 30 days:  No Current discharge risk:    Barriers to Discharge:      Nila Nephew, LCSW 07/19/2018, 1:58 PM  [phone]

## 2018-07-19 NOTE — Progress Notes (Signed)
Initial Nutrition Assessment  INTERVENTION:   -Provide Ensure Enlive po BID PRN, each supplement provides 350 kcal and 20 grams of protein. Pt would like to request Ensure at any time. -Provide PM snack  NUTRITION DIAGNOSIS:   Increased nutrient needs related to cancer and cancer related treatments as evidenced by estimated needs.  GOAL:   Patient will meet greater than or equal to 90% of their needs  MONITOR:   PO intake, Labs, Weight trends, Supplement acceptance, I & O's  REASON FOR ASSESSMENT:   Malnutrition Screening Tool   ASSESSMENT:    57 year old female with past medical history significant for metastatic non-small cell lung cancer who finished 6 cycles of gemcitabine about a month and a half ago, follows with Dr. Julien Nordmann, was directly admitted from cancer center for the management of intractable pain in the back of her chest.   Patient reports feeling better today. Pt states that she would like a bedtime snack of a sandwich and would like Ensure ordered on a request basis. RD to place orders. Pt states she doesn't eat much during the day but does get hungry at night.   Per weight records, last recorded weight is from 9/11. Prior to that date pt had lost 34 lb. Uncertain if patient has lost further weight at this time.  Labs reviewed. Medications:  Multivitamin with minerals daily, Miralax packet daily, Senokot-S tablet BID  NUTRITION - FOCUSED PHYSICAL EXAM:    Most Recent Value  Orbital Region  No depletion  Upper Arm Region  No depletion  Thoracic and Lumbar Region  Unable to assess  Buccal Region  Mild depletion  Temple Region  Mild depletion  Clavicle Bone Region  No depletion  Clavicle and Acromion Bone Region  Unable to assess  Scapular Bone Region  Unable to assess  Dorsal Hand  No depletion  Patellar Region  Unable to assess  Anterior Thigh Region  Unable to assess  Posterior Calf Region  Unable to assess  Edema (RD Assessment)  None       Diet  Order:   Diet Order            Diet regular Room service appropriate? Yes; Fluid consistency: Thin  Diet effective now              EDUCATION NEEDS:   Education needs have been addressed  Skin:  Skin Assessment: Reviewed RN Assessment  Last BM:  10/26  Height:   Ht Readings from Last 1 Encounters:  07/15/18 5\' 8"  (1.727 m)    Weight:   Wt Readings from Last 1 Encounters:  06/01/18 76.2 kg    Ideal Body Weight:  63.6 kg  BMI:  25.4 kg/m^2  Estimated Nutritional Needs:   Kcal:  2000-2200 -using weight from 9/11  Protein:  100-110g "  Fluid:  2L/day   Theresa Bibles, MS, RD, LDN [place] [facility] Pager: [phone] After Hours Pager: [phone]

## 2018-07-20 ENCOUNTER — Other Ambulatory Visit: Payer: Self-pay | Admitting: Medical Oncology

## 2018-07-20 ENCOUNTER — Telehealth: Payer: Self-pay | Admitting: Internal Medicine

## 2018-07-20 DIAGNOSIS — C3480 Malignant neoplasm of overlapping sites of unspecified bronchus and lung: Secondary | ICD-10-CM | POA: Diagnosis not present

## 2018-07-20 DIAGNOSIS — R531 Weakness: Secondary | ICD-10-CM | POA: Diagnosis not present

## 2018-07-20 DIAGNOSIS — I10 Essential (primary) hypertension: Secondary | ICD-10-CM | POA: Diagnosis not present

## 2018-07-20 DIAGNOSIS — R1111 Vomiting without nausea: Secondary | ICD-10-CM | POA: Diagnosis not present

## 2018-07-20 DIAGNOSIS — I4892 Unspecified atrial flutter: Secondary | ICD-10-CM | POA: Diagnosis not present

## 2018-07-20 DIAGNOSIS — C3491 Malignant neoplasm of unspecified part of right bronchus or lung: Secondary | ICD-10-CM

## 2018-07-20 DIAGNOSIS — I480 Paroxysmal atrial fibrillation: Secondary | ICD-10-CM | POA: Diagnosis not present

## 2018-07-20 DIAGNOSIS — R52 Pain, unspecified: Secondary | ICD-10-CM | POA: Diagnosis not present

## 2018-07-20 DIAGNOSIS — Z452 Encounter for adjustment and management of vascular access device: Secondary | ICD-10-CM | POA: Diagnosis not present

## 2018-07-20 DIAGNOSIS — Z7401 Bed confinement status: Secondary | ICD-10-CM | POA: Diagnosis not present

## 2018-07-20 DIAGNOSIS — C3490 Malignant neoplasm of unspecified part of unspecified bronchus or lung: Secondary | ICD-10-CM | POA: Diagnosis not present

## 2018-07-20 DIAGNOSIS — G893 Neoplasm related pain (acute) (chronic): Secondary | ICD-10-CM | POA: Diagnosis not present

## 2018-07-20 DIAGNOSIS — E876 Hypokalemia: Secondary | ICD-10-CM

## 2018-07-20 DIAGNOSIS — M2550 Pain in unspecified joint: Secondary | ICD-10-CM | POA: Diagnosis not present

## 2018-07-20 DIAGNOSIS — T402X5A Adverse effect of other opioids, initial encounter: Secondary | ICD-10-CM | POA: Diagnosis not present

## 2018-07-20 DIAGNOSIS — G629 Polyneuropathy, unspecified: Secondary | ICD-10-CM | POA: Diagnosis not present

## 2018-07-20 DIAGNOSIS — C3481 Malignant neoplasm of overlapping sites of right bronchus and lung: Secondary | ICD-10-CM | POA: Diagnosis not present

## 2018-07-20 DIAGNOSIS — S20411A Abrasion of right back wall of thorax, initial encounter: Secondary | ICD-10-CM | POA: Diagnosis not present

## 2018-07-20 DIAGNOSIS — D539 Nutritional anemia, unspecified: Secondary | ICD-10-CM | POA: Diagnosis not present

## 2018-07-20 DIAGNOSIS — G620 Drug-induced polyneuropathy: Secondary | ICD-10-CM | POA: Diagnosis not present

## 2018-07-20 DIAGNOSIS — Z515 Encounter for palliative care: Secondary | ICD-10-CM | POA: Diagnosis not present

## 2018-07-20 DIAGNOSIS — R5381 Other malaise: Secondary | ICD-10-CM | POA: Diagnosis not present

## 2018-07-20 LAB — FOLATE RBC
FOLATE, RBC: 1204 ng/mL (ref >498)
Folate, Hemolysate: 286.5 ng/mL
Hematocrit: 23.8 % — ABNORMAL LOW (ref 34.0–46.6)

## 2018-07-20 MED ORDER — SENNOSIDES-DOCUSATE SODIUM 8.6-50 MG PO TABS: 2.0000 | ORAL_TABLET | Freq: Every day | ORAL | 0 refills | Status: AC

## 2018-07-20 MED ORDER — DEXAMETHASONE 6 MG PO TABS: 6.0000 mg | ORAL_TABLET | Freq: Every day | ORAL | 0 refills | Status: AC

## 2018-07-20 MED ORDER — HEPARIN SOD (PORK) LOCK FLUSH 100 UNIT/ML IV SOLN
500.0000 [IU] | Freq: Once | INTRAVENOUS | Status: AC
  Administered 2018-07-20: 500 [IU] via INTRAVENOUS
  Filled 2018-07-20: qty 5

## 2018-07-20 MED ORDER — POLYETHYLENE GLYCOL 3350 17 G PO PACK: 17.0000 g | PACK | Freq: Every day | ORAL | 0 refills | Status: AC

## 2018-07-20 MED ORDER — POTASSIUM CHLORIDE CRYS ER 20 MEQ PO TBCR: 20.0000 meq | EXTENDED_RELEASE_TABLET | ORAL | 0 refills | Status: DC

## 2018-07-20 MED ORDER — HYDROMORPHONE HCL 8 MG PO TABS: 8.0000 mg | ORAL_TABLET | ORAL | 0 refills | Status: AC | PRN

## 2018-07-20 MED ORDER — POTASSIUM CHLORIDE CRYS ER 20 MEQ PO TBCR: 20.0000 meq | EXTENDED_RELEASE_TABLET | ORAL | 0 refills | Status: AC

## 2018-07-20 MED ORDER — ACETAMINOPHEN 500 MG PO TABS: 500.0000 mg | ORAL_TABLET | Freq: Three times a day (TID) | ORAL | 0 refills | Status: AC | PRN

## 2018-07-20 MED ORDER — FENTANYL 100 MCG/HR TD PT72: 100.0000 ug | MEDICATED_PATCH | TRANSDERMAL | 0 refills | Status: DC

## 2018-07-20 NOTE — Care Management Important Message (Signed)
Important Message  Patient Details  Name: Theresa [name] MRN: [id number] Date of Birth: April 01, 1961   Medicare Important Message Given:  Yes    Kerin Salen 07/20/2018, 11:38 AMImportant Message  Patient Details  Name: Theresa [name] MRN: [id number] Date of Birth: 04-11-1961   Medicare Important Message Given:  Yes    Kerin Salen 07/20/2018, 11:38 AM

## 2018-07-20 NOTE — Progress Notes (Signed)
Provided pt with SNF bed offers- She states she is having her daughter visit in order to make selection today.  Sharren Bridge, MSW, LCSW Clinical Social Work 07/20/2018 [phone]

## 2018-07-20 NOTE — Discharge Summary (Addendum)
Discharge Summary  Theresa [name] Theresa [name] DOB: 11-26-1960  PCP: Patient, No Pcp Per  Admit date: 07/15/2018 Discharge date: 07/20/2018  Time spent: 78mins, more than 50% time spent on coordination of care. Case discussed with oncology Theresa [name] and palliative care Theresa [name]  Recommendations for Outpatient Follow-up:  1. F/u with SNF MD for hospital discharge follow up, repeat cbc/bmp at follow up 2. F/u with oncology Theresa [name] 3. Palliative care to continue to follow at SNF  Discharge Diagnoses:  Active Hospital Problems   Diagnosis Date Noted  . Palliative care by specialist   . Intractable pain 07/15/2018  . Metastatic lung cancer (metastasis from lung to other site) [facility]) 05/03/2017    Resolved Hospital Problems  No resolved problems to display.    Discharge Condition: stable  Diet recommendation: regular diet  There were no vitals filed for this visit.  History of present illness: (per admitting MD Theresa Theresa [name]) Chief Complaint: Intractable pain  HPI: Theresa [name] is a 57 y.o. female very pleasant young lady with medical history significant of metastatic non-small cell lung cancer finished 6 cycles of gemcitabine directly admitted from cancer center with intractable pain in the back of her chest.  Patient normally takes fentanyl and Doxy at home she ran out of fentanyl.  She has large tumor eroding through her posterior right chest wall.  She was given morphine 2 mg, Decadron at the cancer center.  She does complain of nausea but has had no vomiting however she is very constipated she said her mother had to disimpact her last night at home.  She denies any abdominal pain anterior chest pain shortness of breath cough fever or chills.  Her sister was present throughout the conversation. ED Course: Patient admitted from cancer center directly.  Hospital Course:  Active Problems:   Metastatic lung cancer (metastasis from lung to other site) [facility])   Intractable  pain   Palliative care by specialist   Intractable cancer pain at the region of her right scapula/right posterior chest wall mass She is sent from oncology clinic due to intractable cancer pain Palliative care team following for symptoms management and goals of care.  Appreciate recommendations. Pain is better controlled on increased dose of fentanyl, oral prn dilaudid.  She was started on decadron on admission, taper decadron to off in the next 1-2 weeks  Metastatic lung cancer Post chemotherapy and radiation in the past With mets to tip of right transverse process of T9 vertebrae and chest wall mass Case discussed with Theresa. Julien [name] who states patient has already received multiple radiation therapy, no more radiation therapy recommended moving forward. Per Theresa [name] chemotherapy option is limited, Theresa [name] will see patient in his office to further discuss with patient. Palliative care to continue follow patient at SNF for goals of care.   Opiate-induced constipation, improving Continue Senokot 2 tablets twice daily, Dulcolax, lactulose Continue MiraLAX daily  Hypokalemia:  Likely due to poor oral intake, replaced.  Ambulatory dysfunction/physical debility PT assessed and recommended SNF  Chemotherapy-induced neuropathy Continue gabapentin  Macrocytic anemia No acute bleeding Hemoglobin 8.5 with baseline hemoglobin of 9 MCV 101 Hemoglobin stable  Hypertension Blood pressure stable Continue home metoprolol    Code Status: Full  Family Communication: None at bedside  Disposition Plan:  SNF with palliative care following  Consultants:  Palliative care team  Phone conversation with oncology Theresa [name]  Procedures:  None  Antimicrobials:  None    Discharge Exam: BP 109/76 (BP Location:  Right Arm)   Pulse 91   Temp 98.2 F (36.8 C) (Oral)   Resp 16   LMP 03/21/2008   SpO2 99%   General: NAD, pleasant Cardiovascular:  RRR Respiratory: CTABL, good respiratory effort  Musculoskeletal: No lower extremity edema. 2/4 pulses in all 4 extremities.  large size Mass visualized and palpated at the right scapula.  Discharge Instructions You were cared for by a hospitalist during your hospital stay. If you have any questions about your discharge medications or the care you received while you were in the hospital after you are discharged, you can call the unit and asked to speak with the hospitalist on call if the hospitalist that took care of you is not available. Once you are discharged, your primary care physician will handle any further medical issues. Please note that NO REFILLS for any discharge medications will be authorized once you are discharged, as it is imperative that you return to your primary care physician (or establish a relationship with a primary care physician if you do not have one) for your aftercare needs so that they can reassess your need for medications and monitor your lab values.  Discharge Instructions    Diet general   Complete by:  As directed    Increase activity slowly   Complete by:  As directed      Allergies as of 07/20/2018   No Known Allergies     Medication List    STOP taking these medications   fentaNYL 50 MCG/HR Commonly known as:  Rye - dosed mcg/hr Replaced by:  fentaNYL 100 MCG/HR   lidocaine-prilocaine cream Commonly known as:  EMLA   LORazepam 0.5 MG tablet Commonly known as:  ATIVAN   mirtazapine 30 MG tablet Commonly known as:  REMERON   ondansetron 8 MG tablet Commonly known as:  ZOFRAN   oxyCODONE 5 MG immediate release tablet Commonly known as:  Oxy IR/ROXICODONE   prochlorperazine 10 MG tablet Commonly known as:  COMPAZINE     TAKE these medications   acetaminophen 500 MG tablet Commonly known as:  TYLENOL Take 1 tablet (500 mg total) by mouth every 8 (eight) hours as needed.   dexamethasone 6 MG tablet Commonly known as:   DECADRON Take 1 tablet (6 mg total) by mouth daily for 7 days.   fentaNYL 100 MCG/HR Commonly known as:  DURAGESIC - dosed mcg/hr Place 1 patch (100 mcg total) onto the skin every 3 (three) days. Replaces:  fentaNYL 50 MCG/HR   gabapentin 300 MG capsule Commonly known as:  NEURONTIN Take 1 capsule in the morning, 1 capsule in the afternoon, and 2 capsules at bedtime.   HYDROmorphone 8 MG tablet Commonly known as:  DILAUDID Take 1 tablet (8 mg total) by mouth every 3 (three) hours as needed for severe pain.   lidocaine 5 % Commonly known as:  LIDODERM Place 1 patch onto the skin daily. Remove & Discard patch within 12 hours or as directed by MD   metoprolol tartrate 25 MG tablet Commonly known as:  LOPRESSOR Take 0.5 tablets (12.5 mg total) by mouth 2 (two) times daily.   multivitamin capsule Take 1 capsule daily by mouth.   ondansetron 4 MG disintegrating tablet Commonly known as:  ZOFRAN-ODT Take 1 tablet (4 mg total) by mouth every 8 (eight) hours as needed for nausea or vomiting.   polyethylene glycol packet Commonly known as:  MIRALAX / GLYCOLAX Take 17 g by mouth daily. Start taking on:  07/21/2018  potassium chloride SA 20 MEQ tablet Commonly known as:  K-DUR,KLOR-CON Take 1 tablet (20 mEq total) by mouth every Monday, Wednesday, and Friday. Take 2 tabs every morning and 2 tab every evening. What changed:    how much to take  how to take this  when to take this   senna-docusate 8.6-50 MG tablet Commonly known as:  Senokot-S Take 2 tablets by mouth at bedtime.      No Known Allergies  Contact information for follow-up providers    Curt Bears, MD Follow up in 1 week(s).   Specialty:  Oncology Why:  hospital discharge follow up Contact information: [place]  [address] [phone]        palliative care to continue to follow at snf Follow up.            Contact information for after-discharge care     Destination    [facility] .   Service:  Skilled Nursing Contact information: [address] [place] [phone]                   The results of significant diagnostics from this hospitalization (including imaging, microbiology, ancillary and laboratory) are listed below for reference.    Significant Diagnostic Studies: No results found.  Microbiology: No results found for this or any previous visit (from the past 240 hour(s)).   Labs: Basic Metabolic Panel: Recent Labs  Lab 07/15/18 1002 07/16/18 0512 07/17/18 0600 07/19/18 0535  NA 136 138 143 138  K 3.0* 3.8 4.0 3.5  CL 99 107 111 104  CO2 24 24 24 26   GLUCOSE 117* 194* 145* 135*  BUN 6 8 8 9   CREATININE 0.64 0.61 0.49 0.51  CALCIUM 9.8 9.2 9.1 9.0   Liver Function Tests: Recent Labs  Lab 07/15/18 1002  AST 49*  ALT 10  ALKPHOS 80  BILITOT 0.8  PROT 8.3*  ALBUMIN 2.9*   No results for input(s): LIPASE, AMYLASE in the last 168 hours. No results for input(s): AMMONIA in the last 168 hours. CBC: Recent Labs  Lab 07/15/18 1002 07/16/18 0512 07/17/18 1955 07/19/18 0535  WBC 8.7 6.4  --  10.2  NEUTROABS 6.9  --   --   --   HGB 9.2* 8.5*  --  8.7*  HCT 28.1* 27.5* 23.8* 27.2*  MCV 98.6 101.5*  --  100.4*  PLT 225 215  --  228   Cardiac Enzymes: No results for input(s): CKTOTAL, CKMB, CKMBINDEX, TROPONINI in the last 168 hours. BNP: BNP (last 3 results) No results for input(s): BNP in the last 8760 hours.  ProBNP (last 3 results) No results for input(s): PROBNP in the last 8760 hours.  CBG: No results for input(s): GLUCAP in the last 168 hours.     Signed:  Florencia Reasons MD, PhD  [facility] 07/20/2018, 2:39 PM

## 2018-07-20 NOTE — Progress Notes (Signed)
Daily Progress Note   Patient Name: Theresa [name]       Date: 07/20/2018 DOB: July 01, 1961  Age: 57 y.o. MRN#: [id number] Attending Physician: Florencia Reasons, MD Primary Care Physician: Patient, No Pcp Per Admit Date: 07/15/2018  Reason for Consultation/Follow-up: Establishing goals of care and Pain control  Subjective: Theresa [name] is awake and alert sitting up in bed.  Pain regimen noted.     Length of Stay: 3  Current Medications: Scheduled Meds:  . acetaminophen  1,000 mg Oral Q6H  . dexamethasone  6 mg Oral Q12H  . enoxaparin (LOVENOX) injection  40 mg Subcutaneous Q24H  . fentaNYL  100 mcg Transdermal Q72H  . gabapentin  300 mg Oral 2 times per day  . gabapentin  600 mg Oral QHS  . lidocaine  1 patch Transdermal Q24H  . metoprolol tartrate  12.5 mg Oral BID  . multivitamin with minerals  1 tablet Oral Daily  . polyethylene glycol  17 g Oral Daily  . senna-docusate  2 tablet Oral BID    Continuous Infusions: . sodium chloride 75 mL/hr at 07/20/18 1030    PRN Meds: feeding supplement (ENSURE ENLIVE), HYDROmorphone (DILAUDID) injection, HYDROmorphone, ondansetron (ZOFRAN) IV, ondansetron  Physical Exam         General: Alert, awake, in no acute distress.  HEENT: No bruits, no goiter, no JVD Heart: Regular rate and rhythm. No murmur appreciated. Lungs: Good air movement, clear Abdomen: Soft, nontender, nondistended, positive bowel sounds.  Ext: No significant edema Skin: Warm and dry Neuro: Grossly intact, nonfocal.  Right posterior chest wall inspected. Patient has large firm tumor on upper back of her right chest. She has pain right lateral side of the chest wall as well.  Vital Signs: BP 109/76 (BP Location: Right Arm)   Pulse 91   Temp 98.2 F (36.8 C) (Oral)    Resp 16   LMP 03/21/2008   SpO2 99%  SpO2: SpO2: 99 % O2 Device: O2 Device: Room Air O2 Flow Rate:    Intake/output summary:   Intake/Output Summary (Last 24 hours) at 07/20/2018 1410 Last data filed at 07/20/2018 1155 Gross per 24 hour  Intake 3671.48 ml  Output -  Net 3671.48 ml   LBM: Last BM Date: 07/18/18 Baseline Weight:   Most recent weight:  Palliative Assessment/Data:    Flowsheet Rows     Most Recent Value  Intake Tab  Referral Department  Hospitalist  Unit at Time of Referral  Oncology Unit  Palliative Care Primary Diagnosis  Cancer  Date Notified  07/15/18  Palliative Care Type  New Palliative care  Reason for referral  Pain, Non-pain Symptom, Clarify Goals of Care  Date of Admission  07/15/18  Date first seen by Palliative Care  07/17/18  # of days Palliative referral response time  2 Day(s)  # of days IP prior to Palliative referral  0  Clinical Assessment  Palliative Performance Scale Score  50%  Psychosocial & Spiritual Assessment  Palliative Care Outcomes  Patient/Family meeting held?  Yes  Who was at the meeting?  Patient, 2 daughters, son, sister  Palliative Care Outcomes  Improved pain interventions, Improved non-pain symptom therapy, Clarified goals of care      Patient Active Problem List   Diagnosis Date Noted  . Palliative care by specialist   . Intractable pain 07/15/2018  . Goals of care, counseling/discussion 01/04/2018  . Bronchitis 10/15/2017  . Peripheral neuropathy 10/15/2017  . Encounter for antineoplastic immunotherapy 05/12/2017  . Metastatic lung cancer (metastasis from lung to other site) ([place]) 05/03/2017  . Dehydration 10/11/2015  . Nausea with vomiting 10/11/2015  . Chemotherapy induced neutropenia ([place]) 09/30/2015  . Hypokalemia 09/30/2015  . 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 Antineoplastic chemotherapy induced pancytopenia ([place]) 09/18/2015  . Constipation 09/18/2015  . Intractable nausea and  vomiting 09/18/2015  . Typical atrial flutter ([place])   . Malignant neoplasm of overlapping sites of right lung ([place])   . Malnutrition of moderate degree 09/17/2015  . Chest pain 09/16/2015  . Tachycardia 09/16/2015  . Pain in the chest   . Encounter for antineoplastic chemotherapy 08/27/2015  . Non-small cell carcinoma of lung, stage 3 ([place]) 08/08/2015  . Lung mass 07/16/2015  . Malignant neoplasm of cervix ([place]) 10/08/2011    Palliative Care Assessment & Plan   Patient Profile: 57 y.o. female  with past medical history of metastatic NSCLC admitted on 07/15/2018 with intractable back pain.  Palliative consulted for pain management and goals of care.  Recommendations/Plan: -Full code/full scope.   - She reports understanding that she has serious disease and may not benefit from further disease modifying therapy, but she needs to discuss this further with Dr. Julien Nordmann. She is appreciative of the care provided by Dr. Rogue Jury. She states that both herself and her family are struggling with her serious illness and are fully aware of the possibility of this being non-curable in nature.  - Pain:  transdermal fentanyl patch at 100 g. Continue PO PRN Dilaudid as well.  Recommend SNF rehab with palliative on discharge.     Goals of Care and Additional Recommendations:  Limitations on Scope of Treatment: Full Scope Treatment  Code Status:    Code Status Orders  (From admission, onward)         Start     Ordered   07/15/18 1318  Full code  Continuous     07/15/18 1317        Code Status History    Date Active Date Inactive Code Status Order ID Comments User Context   11/01/2017 1905 11/04/2017 2106 Full Code [id number]  Phillips Grout, MD Inpatient   10/11/2015 0016 10/13/2015 1627 Full Code [id number]  Vianne Bulls, MD ED   09/16/2015 2239 09/19/2015 1529 Full Code [id number]  Rise Patience,  MD Inpatient       Prognosis:   Unable to determine  Discharge  Planning:  Recommend skilled nursing facility with rehabilitation attempt and palliative care following  Care plan was discussed with patient,Dr Erlinda Hong.   Thank you for allowing the Palliative Medicine Team to assist in the care of this patient.   Total Time 25 Prolonged Time Billed No      Greater than 50%  of this time was spent counseling and coordinating care related to the above assessment and plan.  Loistine Chance, MD [phone]  Please contact Palliative Medicine Team phone at [phone] for questions and concerns.

## 2018-07-20 NOTE — Telephone Encounter (Signed)
Scheduled appt per 10/30 sch message - pt is aware of appt added.

## 2018-07-20 NOTE — Clinical Social Work Placement (Addendum)
Admitting to [facility] SNF -room #205 report [phone]. Will arrange PTAR transportation and send DC information via the [place]. Pt thanked CSW and staff for her care while in [place]  NOTE  Date:  07/20/2018  Patient Details  Name: Theresa [name] MRN: [id number] Date of Birth: 1961/01/11  Clinical Social Work is seeking post-discharge placement for this patient at the [place] level of care (*CSW will initial, date and re-position this form in  chart as items are completed):  Yes   Patient/family provided with [place] Work Department's list of facilities offering this level of care within the geographic area requested by the patient (or if unable, by the patient's family).  Yes   Patient/family informed of their freedom to choose among providers that offer the needed level of care, that participate in Medicare, Medicaid or managed care program needed by the patient, have an available bed and are willing to accept the patient.  Yes   Patient/family informed of [facility]'s ownership interest in [facility] and [facility], as well as of the fact that they are under no obligation to receive care at these facilities.  PASRR submitted to EDS on 07/19/18     PASRR number received on 07/19/18     Existing PASRR number confirmed on       FL2 transmitted to all facilities in geographic area requested by pt/family on 07/19/18     FL2 transmitted to all facilities within larger geographic area on       Patient informed that his/her managed care company has contracts with or will negotiate with certain facilities, including the following:            Patient/family informed of bed offers received.  Patient chooses bed at [facility]     Physician recommends and patient chooses bed at [facility]    Patient to be transferred to [place] on 07/20/18.  Patient to be transferred to facility by PTAR     Patient  family notified on 07/20/18 of transfer.  Name of family member notified:  daughter [name]     PHYSICIAN       Additional Comment:    _______________________________________________ Nila Nephew, LCSW 07/20/2018, 2:32 PM [phone]

## 2018-07-22 DIAGNOSIS — T402X5A Adverse effect of other opioids, initial encounter: Secondary | ICD-10-CM | POA: Diagnosis not present

## 2018-07-22 DIAGNOSIS — E876 Hypokalemia: Secondary | ICD-10-CM | POA: Diagnosis not present

## 2018-07-22 DIAGNOSIS — G893 Neoplasm related pain (acute) (chronic): Secondary | ICD-10-CM | POA: Diagnosis not present

## 2018-07-22 DIAGNOSIS — C3480 Malignant neoplasm of overlapping sites of unspecified bronchus and lung: Secondary | ICD-10-CM | POA: Diagnosis not present

## 2018-07-25 ENCOUNTER — Non-Acute Institutional Stay: Payer: Medicare Other | Admitting: Internal Medicine

## 2018-07-25 DIAGNOSIS — Z515 Encounter for palliative care: Secondary | ICD-10-CM | POA: Diagnosis not present

## 2018-07-25 DIAGNOSIS — R531 Weakness: Secondary | ICD-10-CM | POA: Diagnosis not present

## 2018-07-25 NOTE — Progress Notes (Signed)
PALLIATIVE CARE CONSULT VISIT   PATIENT NAME: Theresa [name] DOB: Sep 07, 1961 MRN: [id number]  PRIMARY CARE PROVIDER:   Dr. Florencia [name] REFERRING PROVIDER:  Dr. Arnaez/Dr. Keenan [name]  RESPONSIBLE PARTY:  Self      RECOMMENDATIONS and PLAN:   1.  Weakness R53.1:  Multifactorial and related to progressed neoplasm.  Balanced high protein meals and adequate hydration.  Continue scheduled PT/OT.  Gait training. She will need assistive devices upon returning to home.(wheelchair, tub bench seat).  Fall risk prevention  .2.  Palliative care encounter  Z51.5:  Reviewed advanced care planning, palliative/hospice care services and MOST form.  Pt. Chose attempt CPR and full scope of treatment with IV and antibiotics if indicated.  Tube feedings also selected.  Her goal is to complete therapy, return to her home where she is assisted by her adult children and fiance.  Her 36 year old mother also lives with her intermittently.  She will then have a FU with oncologist( Dr. Earlie [name]) for re-evaluation and future planning.  She expresses that she has no depression or fear and is aware that she may not survive.  She relies on her children to assist in making decisions in the future when she is no longer able to.  Palliative care will continue to follow and is encouraged to continue after discharge to home.  I spent 75  minutes providing this consultation,  from 2:30pm to 3:45pm at [place]. More than 50% of the time in this consultation was spent coordinating communication with patient. NP Theresa [name] and SW   HISTORY OF PRESENT ILLNESS:  Theresa [name] is a 57 y.o. year old female with multiple medical problems including lung cancer with metastasis to the transverse process of T9 and R posterior chest wall mass, P A flutter, pneumonia and influenza in Feb 2019.  She was admitted to the hospital from 10/25-10/30/19 due to intractable cancer pain of the R chest.  She has received radiation and chemotherapy treatments  in the past.  Per medical records, she is not eligible for any additional radiation treatments. Pt reports 2 previous thoracentesis procedures with aprox 800cc removed each occurrence. She requires assistance with ADLs due to weakness and significant peripheral neuropathy.  She feeds self and is continent of B&B.  She is mobile via wheelchair. Palliative Care was asked to help address goals of care.   CODE STATUS:   FULL CODE  PPS: 40% HOSPICE ELIGIBILITY/DIAGNOSIS: TBD  PAST MEDICAL HISTORY:  Past Medical History:  Diagnosis Date  . Cancer ([place])    5 years ago - cervical cancer  . Encounter for antineoplastic chemotherapy 08/27/2015  . History of radiation therapy 08/19/2015 - 10/07/2015   Site/dose:   The patient's primary tumor and involved lymph nodes were treated to 66 Gy in 30 fractions.  . Hypokalemia 09/30/2015  . Non-small cell carcinoma of lung, stage 3 ([place]) 08/08/2015  . Paroxysmal atrial flutter ([place]) 09/16/2015  . Pneumonia     SOCIAL HX:  Social History   Tobacco Use  . Smoking status: Former Smoker    Packs/day: 0.30    Years: 5.00    Pack years: 1.50    Types: Cigarettes    Last attempt to quit: 07/31/1990    Years since quitting: 28.0  . Smokeless tobacco: Never Used  Substance Use Topics  . Alcohol use: No    Alcohol/week: 0.0 standard drinks    ALLERGIES: No Known Allergies   PERTINENT MEDICATIONS:  Outpatient Encounter Medications as of  07/25/2018  Medication Sig  . acetaminophen (TYLENOL) 500 MG tablet Take 1 tablet (500 mg total) by mouth every 8 (eight) hours as needed.  Marland Kitchen dexamethasone (DECADRON) 6 MG tablet Take 1 tablet (6 mg total) by mouth daily for 7 days.  . fentaNYL (DURAGESIC - DOSED MCG/HR) 100 MCG/HR Place 1 patch (100 mcg total) onto the skin every 3 (three) days.  Marland Kitchen gabapentin (NEURONTIN) 300 MG capsule Take 1 capsule in the morning, 1 capsule in the afternoon, and 2 capsules at bedtime.  Marland Kitchen HYDROmorphone (DILAUDID) 8 MG tablet Take 1  tablet (8 mg total) by mouth every 3 (three) hours as needed for severe pain.  Marland Kitchen lidocaine (LIDODERM) 5 % Place 1 patch onto the skin daily. Remove & Discard patch within 12 hours or as directed by MD (Patient not taking: Reported on 07/15/2018)  . metoprolol tartrate (LOPRESSOR) 25 MG tablet Take 0.5 tablets (12.5 mg total) by mouth 2 (two) times daily.  . Multiple Vitamin (MULTIVITAMIN) capsule Take 1 capsule daily by mouth. (Patient not taking: Reported on 07/15/2018)  . ondansetron (ZOFRAN ODT) 4 MG disintegrating tablet Take 1 tablet (4 mg total) by mouth every 8 (eight) hours as needed for nausea or vomiting.  . polyethylene glycol (MIRALAX / GLYCOLAX) packet Take 17 g by mouth daily.  . potassium chloride SA (K-DUR,KLOR-CON) 20 MEQ tablet Take 1 tablet (20 mEq total) by mouth every Monday, Wednesday, and Friday.  . senna-docusate (SENOKOT-S) 8.6-50 MG tablet Take 2 tablets by mouth at bedtime.   No facility-administered encounter medications on file as of 07/25/2018.     PHYSICAL EXAM:   General: Chronically ill appearing female in NAD while sitting in [facility] Cardiovascular: rapid  rate at 122 and regular rhythm Pulmonary: Absent breath sounds in RLL, clear breath sounds in remaining lobes Abdomen: soft, nontender, + bowel sounds GU: no suprapubic tenderness Extremities: no edema, no joint deformities Skin: Firm and large mass of the R scapular region Neurological: A&O x3.  Generalized weakness with R hemiplegia Psych:  Appropriate mood.  Calm and cooperative.  Logical conversation  Theresa Lex, NP-C

## 2018-07-28 ENCOUNTER — Inpatient Hospital Stay: Payer: Medicare Other | Attending: Internal Medicine | Admitting: Internal Medicine

## 2018-07-28 ENCOUNTER — Telehealth: Payer: Self-pay | Admitting: Internal Medicine

## 2018-07-28 ENCOUNTER — Inpatient Hospital Stay: Payer: Medicare Other

## 2018-07-28 NOTE — Telephone Encounter (Signed)
Unable to reach patient - called patient per 11/6 sch message to reschedule - left message for patient to call back if they still wanted to r/s . Made Rn aware that patient may not come in .

## 2018-07-29 ENCOUNTER — Telehealth: Payer: Self-pay | Admitting: Medical Oncology

## 2018-07-29 NOTE — Telephone Encounter (Signed)
Returned pt call . She missed an appt yesterday.

## 2018-08-03 DIAGNOSIS — R5381 Other malaise: Secondary | ICD-10-CM | POA: Diagnosis not present

## 2018-08-03 DIAGNOSIS — I480 Paroxysmal atrial fibrillation: Secondary | ICD-10-CM | POA: Diagnosis not present

## 2018-08-03 DIAGNOSIS — G893 Neoplasm related pain (acute) (chronic): Secondary | ICD-10-CM | POA: Diagnosis not present

## 2018-08-08 ENCOUNTER — Non-Acute Institutional Stay: Payer: Medicare Other | Admitting: Internal Medicine

## 2018-08-11 ENCOUNTER — Telehealth: Payer: Self-pay | Admitting: Medical Oncology

## 2018-08-11 NOTE — Telephone Encounter (Signed)
P update -she is living at [place] ,getting PT , OT and doing well. Schedule request sent for port flush in December.

## 2018-08-11 NOTE — Telephone Encounter (Signed)
Unable to LVM.

## 2018-08-12 ENCOUNTER — Telehealth: Payer: Self-pay | Admitting: Internal Medicine

## 2018-08-12 DIAGNOSIS — C3480 Malignant neoplasm of overlapping sites of unspecified bronchus and lung: Secondary | ICD-10-CM | POA: Diagnosis not present

## 2018-08-12 DIAGNOSIS — G620 Drug-induced polyneuropathy: Secondary | ICD-10-CM | POA: Diagnosis not present

## 2018-08-12 DIAGNOSIS — I480 Paroxysmal atrial fibrillation: Secondary | ICD-10-CM | POA: Diagnosis not present

## 2018-08-12 DIAGNOSIS — G893 Neoplasm related pain (acute) (chronic): Secondary | ICD-10-CM | POA: Diagnosis not present

## 2018-08-12 NOTE — Telephone Encounter (Signed)
Scheduled appt per 11/21 sch messag e- left message for patient with appt date and time

## 2018-08-17 DIAGNOSIS — R1111 Vomiting without nausea: Secondary | ICD-10-CM | POA: Diagnosis not present

## 2018-08-17 DIAGNOSIS — C3490 Malignant neoplasm of unspecified part of unspecified bronchus or lung: Secondary | ICD-10-CM | POA: Diagnosis not present

## 2018-08-17 DIAGNOSIS — S20411A Abrasion of right back wall of thorax, initial encounter: Secondary | ICD-10-CM | POA: Diagnosis not present

## 2018-08-31 ENCOUNTER — Inpatient Hospital Stay: Payer: Medicare Other | Attending: Internal Medicine

## 2018-08-31 DIAGNOSIS — Z452 Encounter for adjustment and management of vascular access device: Secondary | ICD-10-CM | POA: Insufficient documentation

## 2018-08-31 DIAGNOSIS — E860 Dehydration: Secondary | ICD-10-CM

## 2018-08-31 DIAGNOSIS — C3481 Malignant neoplasm of overlapping sites of right bronchus and lung: Secondary | ICD-10-CM | POA: Diagnosis not present

## 2018-08-31 MED ORDER — HEPARIN SOD (PORK) LOCK FLUSH 100 UNIT/ML IV SOLN
500.0000 [IU] | Freq: Once | INTRAVENOUS | Status: AC | PRN
  Administered 2018-08-31: 500 [IU]
  Filled 2018-08-31: qty 5

## 2018-08-31 MED ORDER — SODIUM CHLORIDE 0.9% FLUSH
10.0000 mL | INTRAVENOUS | Status: DC | PRN
  Administered 2018-08-31: 10 mL
  Filled 2018-08-31: qty 10

## 2018-08-31 NOTE — Patient Instructions (Signed)
Implanted Port Home Guide An implanted port is a type of central line that is placed under the skin. Central lines are used to provide IV access when treatment or nutrition needs to be given through a person's veins. Implanted ports are used for long-term IV access. An implanted port may be placed because:  You need IV medicine that would be irritating to the small veins in your hands or arms.  You need long-term IV medicines, such as antibiotics.  You need IV nutrition for a long period.  You need frequent blood draws for lab tests.  You need dialysis.  Implanted ports are usually placed in the chest area, but they can also be placed in the upper arm, the abdomen, or the leg. An implanted port has two main parts:  Reservoir. The reservoir is round and will appear as a small, raised area under your skin. The reservoir is the part where a needle is inserted to give medicines or draw blood.  Catheter. The catheter is a thin, flexible tube that extends from the reservoir. The catheter is placed into a large vein. Medicine that is inserted into the reservoir goes into the catheter and then into the vein.  How will I care for my incision site? Do not get the incision site wet. Bathe or shower as directed by your health care provider. How is my port accessed? Special steps must be taken to access the port:  Before the port is accessed, a numbing cream can be placed on the skin. This helps numb the skin over the port site.  Your health care provider uses a sterile technique to access the port. ? Your health care provider must put on a mask and sterile gloves. ? The skin over your port is cleaned carefully with an antiseptic and allowed to dry. ? The port is gently pinched between sterile gloves, and a needle is inserted into the port.  Only "non-coring" port needles should be used to access the port. Once the port is accessed, a blood return should be checked. This helps ensure that the port  is in the vein and is not clogged.  If your port needs to remain accessed for a constant infusion, a clear (transparent) bandage will be placed over the needle site. The bandage and needle will need to be changed every week, or as directed by your health care provider.  Keep the bandage covering the needle clean and dry. Do not get it wet. Follow your health care provider's instructions on how to take a shower or bath while the port is accessed.  If your port does not need to stay accessed, no bandage is needed over the port.  What is flushing? Flushing helps keep the port from getting clogged. Follow your health care provider's instructions on how and when to flush the port. Ports are usually flushed with saline solution or a medicine called heparin. The need for flushing will depend on how the port is used.  If the port is used for intermittent medicines or blood draws, the port will need to be flushed: ? After medicines have been given. ? After blood has been drawn. ? As part of routine maintenance.  If a constant infusion is running, the port may not need to be flushed.  How long will my port stay implanted? The port can stay in for as long as your health care provider thinks it is needed. When it is time for the port to come out, surgery will be   done to remove it. The procedure is similar to the one performed when the port was put in. When should I seek immediate medical care? When you have an implanted port, you should seek immediate medical care if:  You notice a bad smell coming from the incision site.  You have swelling, redness, or drainage at the incision site.  You have more swelling or pain at the port site or the surrounding area.  You have a fever that is not controlled with medicine.  This information is not intended to replace advice given to you by your health care provider. Make sure you discuss any questions you have with your health care provider. Document  Released: 09/07/2005 Document Revised: 02/13/2016 Document Reviewed: 05/15/2013 Elsevier Interactive Patient Education  2017 [employer]  

## 2018-09-01 ENCOUNTER — Telehealth: Payer: Self-pay | Admitting: Medical Oncology

## 2018-09-01 NOTE — Telephone Encounter (Signed)
Returned pt call. LVM to call me back

## 2018-09-05 DIAGNOSIS — C3480 Malignant neoplasm of overlapping sites of unspecified bronchus and lung: Secondary | ICD-10-CM | POA: Diagnosis not present

## 2018-09-05 DIAGNOSIS — G620 Drug-induced polyneuropathy: Secondary | ICD-10-CM | POA: Diagnosis not present

## 2018-09-05 DIAGNOSIS — G893 Neoplasm related pain (acute) (chronic): Secondary | ICD-10-CM | POA: Diagnosis not present

## 2018-09-05 DIAGNOSIS — I480 Paroxysmal atrial fibrillation: Secondary | ICD-10-CM | POA: Diagnosis not present

## 2018-09-06 ENCOUNTER — Other Ambulatory Visit: Payer: Self-pay | Admitting: Oncology

## 2018-09-06 ENCOUNTER — Telehealth: Payer: Self-pay | Admitting: Medical Oncology

## 2018-09-06 DIAGNOSIS — G893 Neoplasm related pain (acute) (chronic): Secondary | ICD-10-CM

## 2018-09-06 DIAGNOSIS — C342 Malignant neoplasm of middle lobe, bronchus or lung: Secondary | ICD-10-CM

## 2018-09-06 MED ORDER — FENTANYL 100 MCG/HR TD PT72: 100.0000 ug | MEDICATED_PATCH | TRANSDERMAL | 0 refills | Status: AC

## 2018-09-06 MED ORDER — OXYCODONE HCL 5 MG PO TABS: 5.0000 mg | ORAL_TABLET | Freq: Four times a day (QID) | ORAL | 0 refills | Status: DC | PRN

## 2018-09-06 MED FILL — fentaNYL 100 MCG/HR PT72: 100 | 30 days supply | Qty: 10 | Fill #0

## 2018-09-06 MED FILL — oxyCODONE HCL 5 MG TABS: 5 | 7 days supply | Qty: 30 | Fill #0

## 2018-09-06 NOTE — Telephone Encounter (Signed)
"  Two new knots under her right arm". Wants to see Julien Nordmann for these  after new years day. Wound care nurse is  taking care of open wound on her back.  Pain med refill - will be d/c Friday from rehab and needs Fentanyl for home. Rehab also administered dilaudid which pt states does not work as well as oxycodone. She does not want dilaudid.Can she get rx refill for fentanyl and oxycodone.

## 2018-09-06 NOTE — Telephone Encounter (Signed)
Prescriptions sent to [facility].

## 2018-09-08 ENCOUNTER — Telehealth: Payer: Self-pay | Admitting: Internal Medicine

## 2018-09-08 NOTE — Telephone Encounter (Signed)
Scheduled appt per 12/17 schmessage - pt is aware of appt date and time

## 2018-09-15 ENCOUNTER — Inpatient Hospital Stay: Payer: Medicare Other | Admitting: Family Medicine

## 2018-09-16 DIAGNOSIS — C7989 Secondary malignant neoplasm of other specified sites: Secondary | ICD-10-CM | POA: Diagnosis not present

## 2018-09-16 DIAGNOSIS — Z87891 Personal history of nicotine dependence: Secondary | ICD-10-CM | POA: Diagnosis not present

## 2018-09-16 DIAGNOSIS — C3491 Malignant neoplasm of unspecified part of right bronchus or lung: Secondary | ICD-10-CM | POA: Diagnosis not present

## 2018-09-16 DIAGNOSIS — D630 Anemia in neoplastic disease: Secondary | ICD-10-CM | POA: Diagnosis not present

## 2018-09-16 DIAGNOSIS — E876 Hypokalemia: Secondary | ICD-10-CM | POA: Diagnosis not present

## 2018-09-16 DIAGNOSIS — Z9181 History of falling: Secondary | ICD-10-CM | POA: Diagnosis not present

## 2018-09-16 DIAGNOSIS — I10 Essential (primary) hypertension: Secondary | ICD-10-CM | POA: Diagnosis not present

## 2018-09-16 DIAGNOSIS — G893 Neoplasm related pain (acute) (chronic): Secondary | ICD-10-CM | POA: Diagnosis not present

## 2018-09-16 DIAGNOSIS — G620 Drug-induced polyneuropathy: Secondary | ICD-10-CM | POA: Diagnosis not present

## 2018-09-16 DIAGNOSIS — T402X5D Adverse effect of other opioids, subsequent encounter: Secondary | ICD-10-CM | POA: Diagnosis not present

## 2018-09-16 DIAGNOSIS — I4892 Unspecified atrial flutter: Secondary | ICD-10-CM | POA: Diagnosis not present

## 2018-09-16 DIAGNOSIS — C7951 Secondary malignant neoplasm of bone: Secondary | ICD-10-CM | POA: Diagnosis not present

## 2018-09-18 DIAGNOSIS — C3491 Malignant neoplasm of unspecified part of right bronchus or lung: Secondary | ICD-10-CM | POA: Diagnosis not present

## 2018-09-18 DIAGNOSIS — C7989 Secondary malignant neoplasm of other specified sites: Secondary | ICD-10-CM | POA: Diagnosis not present

## 2018-09-18 DIAGNOSIS — I10 Essential (primary) hypertension: Secondary | ICD-10-CM | POA: Diagnosis not present

## 2018-09-18 DIAGNOSIS — I4892 Unspecified atrial flutter: Secondary | ICD-10-CM | POA: Diagnosis not present

## 2018-09-18 DIAGNOSIS — C7951 Secondary malignant neoplasm of bone: Secondary | ICD-10-CM | POA: Diagnosis not present

## 2018-09-18 DIAGNOSIS — G893 Neoplasm related pain (acute) (chronic): Secondary | ICD-10-CM | POA: Diagnosis not present

## 2018-09-19 ENCOUNTER — Telehealth: Payer: Self-pay | Admitting: Family Medicine

## 2018-09-19 ENCOUNTER — Other Ambulatory Visit: Payer: Self-pay | Admitting: Oncology

## 2018-09-19 DIAGNOSIS — G893 Neoplasm related pain (acute) (chronic): Secondary | ICD-10-CM

## 2018-09-19 DIAGNOSIS — C342 Malignant neoplasm of middle lobe, bronchus or lung: Secondary | ICD-10-CM

## 2018-09-19 NOTE — Telephone Encounter (Signed)
Copied from [place] [id number]. Topic: Quick Communication - Home Health Verbal Orders >> Sep 19, 2018  5:01 PM Alfredia Ferguson R wrote: Caller/Agency: [facility] Number: [phone] Requesting OT/PT/Skilled Nursing/Social Work: PT Frequency: 2xweek for 3weeks effective 09/18/2018

## 2018-09-20 ENCOUNTER — Other Ambulatory Visit: Payer: Self-pay | Admitting: Medical Oncology

## 2018-09-20 DIAGNOSIS — C7951 Secondary malignant neoplasm of bone: Secondary | ICD-10-CM | POA: Diagnosis not present

## 2018-09-20 DIAGNOSIS — I10 Essential (primary) hypertension: Secondary | ICD-10-CM | POA: Diagnosis not present

## 2018-09-20 DIAGNOSIS — G893 Neoplasm related pain (acute) (chronic): Secondary | ICD-10-CM | POA: Diagnosis not present

## 2018-09-20 DIAGNOSIS — C3491 Malignant neoplasm of unspecified part of right bronchus or lung: Secondary | ICD-10-CM | POA: Diagnosis not present

## 2018-09-20 DIAGNOSIS — C7989 Secondary malignant neoplasm of other specified sites: Secondary | ICD-10-CM | POA: Diagnosis not present

## 2018-09-20 DIAGNOSIS — C342 Malignant neoplasm of middle lobe, bronchus or lung: Secondary | ICD-10-CM

## 2018-09-20 DIAGNOSIS — I4892 Unspecified atrial flutter: Secondary | ICD-10-CM | POA: Diagnosis not present

## 2018-09-20 MED ORDER — OXYCODONE HCL 5 MG PO TABS: 5.0000 mg | ORAL_TABLET | Freq: Four times a day (QID) | ORAL | 0 refills | Status: DC | PRN

## 2018-09-20 NOTE — Telephone Encounter (Signed)
Verbal given 

## 2018-09-21 DIAGNOSIS — I4892 Unspecified atrial flutter: Secondary | ICD-10-CM | POA: Diagnosis not present

## 2018-09-21 DIAGNOSIS — G893 Neoplasm related pain (acute) (chronic): Secondary | ICD-10-CM | POA: Diagnosis not present

## 2018-09-21 DIAGNOSIS — C3491 Malignant neoplasm of unspecified part of right bronchus or lung: Secondary | ICD-10-CM | POA: Diagnosis not present

## 2018-09-21 DIAGNOSIS — C7989 Secondary malignant neoplasm of other specified sites: Secondary | ICD-10-CM | POA: Diagnosis not present

## 2018-09-21 DIAGNOSIS — I10 Essential (primary) hypertension: Secondary | ICD-10-CM | POA: Diagnosis not present

## 2018-09-21 DIAGNOSIS — C7951 Secondary malignant neoplasm of bone: Secondary | ICD-10-CM | POA: Diagnosis not present

## 2018-09-23 ENCOUNTER — Telehealth: Payer: Self-pay

## 2018-09-23 ENCOUNTER — Other Ambulatory Visit: Payer: Self-pay | Admitting: Medical Oncology

## 2018-09-23 ENCOUNTER — Telehealth: Payer: Self-pay | Admitting: Medical Oncology

## 2018-09-23 DIAGNOSIS — C7951 Secondary malignant neoplasm of bone: Secondary | ICD-10-CM | POA: Diagnosis not present

## 2018-09-23 DIAGNOSIS — G893 Neoplasm related pain (acute) (chronic): Secondary | ICD-10-CM | POA: Diagnosis not present

## 2018-09-23 DIAGNOSIS — I10 Essential (primary) hypertension: Secondary | ICD-10-CM | POA: Diagnosis not present

## 2018-09-23 DIAGNOSIS — C7989 Secondary malignant neoplasm of other specified sites: Secondary | ICD-10-CM | POA: Diagnosis not present

## 2018-09-23 DIAGNOSIS — C3491 Malignant neoplasm of unspecified part of right bronchus or lung: Secondary | ICD-10-CM | POA: Diagnosis not present

## 2018-09-23 DIAGNOSIS — I4892 Unspecified atrial flutter: Secondary | ICD-10-CM | POA: Diagnosis not present

## 2018-09-23 NOTE — Telephone Encounter (Signed)
Call from [facility] at [facility] to advise Dr. Nolon Rod that would went from 9cm to 26-27cm.  After review of chart and discussion with home health, would is related to tumor and pt being followed by oncology.  Home health states they l/m for oncology.  Received c/b from oncology while on phone.  Will c/b to clinic if they have further needs.

## 2018-09-23 NOTE — Telephone Encounter (Addendum)
Needs  wound management-R scapular wound ( tumor)has increased 16 cm since 09/16/18 and is now 25 cm x10 cm.- Wound goes up into her axilla with 50 % necrosis with foul odor.They are doing wet to dry normal saline dressings. Next appt jan 16th .Marland Kitchen Santyl ointment for debridement requested. Will ask Julien Nordmann to advise.  Pt uses [facility].  [facility] call back number [phone] And fax number [phone]

## 2018-09-26 ENCOUNTER — Other Ambulatory Visit: Payer: Self-pay | Admitting: Medical Oncology

## 2018-09-26 ENCOUNTER — Telehealth: Payer: Self-pay | Admitting: Medical Oncology

## 2018-09-26 DIAGNOSIS — G893 Neoplasm related pain (acute) (chronic): Secondary | ICD-10-CM

## 2018-09-26 DIAGNOSIS — C3491 Malignant neoplasm of unspecified part of right bronchus or lung: Secondary | ICD-10-CM

## 2018-09-26 DIAGNOSIS — C342 Malignant neoplasm of middle lobe, bronchus or lung: Secondary | ICD-10-CM

## 2018-09-26 DIAGNOSIS — C7989 Secondary malignant neoplasm of other specified sites: Secondary | ICD-10-CM | POA: Diagnosis not present

## 2018-09-26 DIAGNOSIS — I10 Essential (primary) hypertension: Secondary | ICD-10-CM | POA: Diagnosis not present

## 2018-09-26 DIAGNOSIS — I4892 Unspecified atrial flutter: Secondary | ICD-10-CM | POA: Diagnosis not present

## 2018-09-26 DIAGNOSIS — C7951 Secondary malignant neoplasm of bone: Secondary | ICD-10-CM | POA: Diagnosis not present

## 2018-09-26 MED ORDER — COLLAGENASE 250 UNIT/GM EX OINT: 1.0000 "application " | TOPICAL_OINTMENT | Freq: Every day | CUTANEOUS | 0 refills | Status: DC

## 2018-09-26 MED ORDER — FENTANYL 25 MCG/HR TD PT72: 25.0000 ug | MEDICATED_PATCH | TRANSDERMAL | 0 refills | Status: AC

## 2018-09-26 MED ORDER — FENTANYL 25 MCG/HR TD PT72: 25.0000 ug | MEDICATED_PATCH | TRANSDERMAL | 0 refills | Status: DC

## 2018-09-26 MED ORDER — OXYCODONE HCL 5 MG PO TABS: 5.0000 mg | ORAL_TABLET | Freq: Four times a day (QID) | ORAL | 0 refills | Status: DC | PRN

## 2018-09-26 MED FILL — FENTANYL 25 MCG/HR PT72: 25 | 15 days supply | Qty: 5 | Fill #0

## 2018-09-26 MED FILL — oxyCODONE HCL 5 MG TABS: 5 | 7 days supply | Qty: 30 | Fill #0

## 2018-09-26 MED FILL — SANTYL OINTMENT: 250 | 10 days supply | Qty: 30 | Fill #0

## 2018-09-26 NOTE — Telephone Encounter (Signed)
Pain increased at right clavicle tumor erosion. She is crying in pain and gets lightheaded when she lays down. She requests refill for pain med oxycodone.  Hinton Dyer updated on meds ready for pt-santyl ointment .   Plan-Per Dr Julien Nordmann Fentanyl increased to 125 mcg every 72 hours and rx sent to [facility] for 25 mcg patch. Pt stated she is due to change Fentanyl 100 mgc patch today. I instructed her on addeing the 25 mcg patch. She voiced understanding. Oxycodone refilled also. Son told to come to cancer center to pick up rx.  Hospice- Pt wants to talk to [facility] re their program . Called to Amy at [facility].

## 2018-09-27 ENCOUNTER — Telehealth: Payer: Self-pay | Admitting: Licensed Clinical Social Worker

## 2018-09-27 NOTE — Telephone Encounter (Signed)
SW left a vm with patient to schedule a home visit.

## 2018-09-28 DIAGNOSIS — C3491 Malignant neoplasm of unspecified part of right bronchus or lung: Secondary | ICD-10-CM | POA: Diagnosis not present

## 2018-09-28 DIAGNOSIS — G893 Neoplasm related pain (acute) (chronic): Secondary | ICD-10-CM | POA: Diagnosis not present

## 2018-09-28 DIAGNOSIS — I10 Essential (primary) hypertension: Secondary | ICD-10-CM | POA: Diagnosis not present

## 2018-09-28 DIAGNOSIS — C7951 Secondary malignant neoplasm of bone: Secondary | ICD-10-CM | POA: Diagnosis not present

## 2018-09-28 DIAGNOSIS — C7989 Secondary malignant neoplasm of other specified sites: Secondary | ICD-10-CM | POA: Diagnosis not present

## 2018-09-28 DIAGNOSIS — I4892 Unspecified atrial flutter: Secondary | ICD-10-CM | POA: Diagnosis not present

## 2018-09-30 ENCOUNTER — Telehealth: Payer: Self-pay | Admitting: Licensed Clinical Social Worker

## 2018-09-30 DIAGNOSIS — G893 Neoplasm related pain (acute) (chronic): Secondary | ICD-10-CM | POA: Diagnosis not present

## 2018-09-30 DIAGNOSIS — I4892 Unspecified atrial flutter: Secondary | ICD-10-CM | POA: Diagnosis not present

## 2018-09-30 DIAGNOSIS — I10 Essential (primary) hypertension: Secondary | ICD-10-CM | POA: Diagnosis not present

## 2018-09-30 DIAGNOSIS — C7989 Secondary malignant neoplasm of other specified sites: Secondary | ICD-10-CM | POA: Diagnosis not present

## 2018-09-30 DIAGNOSIS — C3491 Malignant neoplasm of unspecified part of right bronchus or lung: Secondary | ICD-10-CM | POA: Diagnosis not present

## 2018-09-30 DIAGNOSIS — C7951 Secondary malignant neoplasm of bone: Secondary | ICD-10-CM | POA: Diagnosis not present

## 2018-09-30 NOTE — Telephone Encounter (Signed)
SW left a vm requesting a home visit.

## 2018-10-03 ENCOUNTER — Telehealth: Payer: Self-pay | Admitting: *Deleted

## 2018-10-03 DIAGNOSIS — I4892 Unspecified atrial flutter: Secondary | ICD-10-CM | POA: Diagnosis not present

## 2018-10-03 DIAGNOSIS — C3491 Malignant neoplasm of unspecified part of right bronchus or lung: Secondary | ICD-10-CM | POA: Diagnosis not present

## 2018-10-03 DIAGNOSIS — C7989 Secondary malignant neoplasm of other specified sites: Secondary | ICD-10-CM | POA: Diagnosis not present

## 2018-10-03 DIAGNOSIS — G893 Neoplasm related pain (acute) (chronic): Secondary | ICD-10-CM | POA: Diagnosis not present

## 2018-10-03 DIAGNOSIS — C7951 Secondary malignant neoplasm of bone: Secondary | ICD-10-CM | POA: Diagnosis not present

## 2018-10-03 DIAGNOSIS — I10 Essential (primary) hypertension: Secondary | ICD-10-CM | POA: Diagnosis not present

## 2018-10-03 DIAGNOSIS — C342 Malignant neoplasm of middle lobe, bronchus or lung: Secondary | ICD-10-CM

## 2018-10-03 NOTE — Telephone Encounter (Signed)
Returned call to pt who asked if she should come to appt tomorrow. Reviewed with MD and notified pt he is more than happy to see her, however he does not have much to offer her, to consider hospice. Reviewed with Coralyn Mark MDs recommendations, pt agreed to hospice. Call placed to [facility] for pt, advised [place] they will call her within 24 hours to set up an appt. [place] was very appreciative of the assistance. Referral placed. Messaged to scheduling to cancel pt appt. 1/14

## 2018-10-04 ENCOUNTER — Inpatient Hospital Stay: Payer: Medicare Other

## 2018-10-04 ENCOUNTER — Inpatient Hospital Stay: Payer: Medicare Other | Admitting: Internal Medicine

## 2018-10-04 DIAGNOSIS — K219 Gastro-esophageal reflux disease without esophagitis: Secondary | ICD-10-CM | POA: Diagnosis not present

## 2018-10-04 DIAGNOSIS — C7989 Secondary malignant neoplasm of other specified sites: Secondary | ICD-10-CM | POA: Diagnosis not present

## 2018-10-04 DIAGNOSIS — R630 Anorexia: Secondary | ICD-10-CM | POA: Diagnosis not present

## 2018-10-04 DIAGNOSIS — I483 Typical atrial flutter: Secondary | ICD-10-CM | POA: Diagnosis not present

## 2018-10-04 DIAGNOSIS — C539 Malignant neoplasm of cervix uteri, unspecified: Secondary | ICD-10-CM | POA: Diagnosis not present

## 2018-10-04 DIAGNOSIS — G893 Neoplasm related pain (acute) (chronic): Secondary | ICD-10-CM | POA: Diagnosis not present

## 2018-10-04 DIAGNOSIS — C7800 Secondary malignant neoplasm of unspecified lung: Secondary | ICD-10-CM | POA: Diagnosis not present

## 2018-10-05 ENCOUNTER — Other Ambulatory Visit: Payer: Self-pay | Admitting: Internal Medicine

## 2018-10-05 ENCOUNTER — Other Ambulatory Visit: Payer: Self-pay | Admitting: *Deleted

## 2018-10-05 DIAGNOSIS — C539 Malignant neoplasm of cervix uteri, unspecified: Secondary | ICD-10-CM | POA: Diagnosis not present

## 2018-10-05 DIAGNOSIS — G893 Neoplasm related pain (acute) (chronic): Secondary | ICD-10-CM

## 2018-10-05 DIAGNOSIS — I483 Typical atrial flutter: Secondary | ICD-10-CM | POA: Diagnosis not present

## 2018-10-05 DIAGNOSIS — C342 Malignant neoplasm of middle lobe, bronchus or lung: Secondary | ICD-10-CM

## 2018-10-05 DIAGNOSIS — R630 Anorexia: Secondary | ICD-10-CM | POA: Diagnosis not present

## 2018-10-05 DIAGNOSIS — C7800 Secondary malignant neoplasm of unspecified lung: Secondary | ICD-10-CM | POA: Diagnosis not present

## 2018-10-05 DIAGNOSIS — C7989 Secondary malignant neoplasm of other specified sites: Secondary | ICD-10-CM | POA: Diagnosis not present

## 2018-10-05 MED ORDER — OXYCODONE HCL 5 MG PO TABS: 5.0000 mg | ORAL_TABLET | ORAL | 0 refills | Status: AC | PRN

## 2018-10-05 MED ORDER — OXYCODONE HCL 5 MG PO TABS: 5.0000 mg | ORAL_TABLET | ORAL | 0 refills | Status: DC | PRN

## 2018-10-05 NOTE — Telephone Encounter (Signed)
Renee RN from Hospice called request for pt pain meds to be refilled. Increase Oxy 5mg  from q 6hrs to q 4hrs. Rx to be faxed to Pt pharmacy

## 2018-10-07 ENCOUNTER — Telehealth: Payer: Self-pay | Admitting: Medical Oncology

## 2018-10-07 ENCOUNTER — Telehealth: Payer: Self-pay | Admitting: Family Medicine

## 2018-10-07 ENCOUNTER — Other Ambulatory Visit: Payer: Self-pay | Admitting: Medical Oncology

## 2018-10-07 DIAGNOSIS — C539 Malignant neoplasm of cervix uteri, unspecified: Secondary | ICD-10-CM | POA: Diagnosis not present

## 2018-10-07 DIAGNOSIS — C3491 Malignant neoplasm of unspecified part of right bronchus or lung: Secondary | ICD-10-CM

## 2018-10-07 DIAGNOSIS — G893 Neoplasm related pain (acute) (chronic): Secondary | ICD-10-CM | POA: Diagnosis not present

## 2018-10-07 DIAGNOSIS — I483 Typical atrial flutter: Secondary | ICD-10-CM | POA: Diagnosis not present

## 2018-10-07 DIAGNOSIS — C7800 Secondary malignant neoplasm of unspecified lung: Secondary | ICD-10-CM | POA: Diagnosis not present

## 2018-10-07 DIAGNOSIS — C7989 Secondary malignant neoplasm of other specified sites: Secondary | ICD-10-CM | POA: Diagnosis not present

## 2018-10-07 DIAGNOSIS — R630 Anorexia: Secondary | ICD-10-CM | POA: Diagnosis not present

## 2018-10-07 MED ORDER — COLLAGENASE 250 UNIT/GM EX OINT: 1.0000 "application " | TOPICAL_OINTMENT | Freq: Every day | CUTANEOUS | 0 refills | Status: DC

## 2018-10-07 MED FILL — SANTYL OINTMENT: 250 | 20 days supply | Qty: 30 | Fill #0

## 2018-10-07 NOTE — Telephone Encounter (Signed)
Copied from [place]. Topic: Quick Communication - See Telephone Encounter >> Oct 07, 2018  9:20 AM Burchel, Abbi R wrote: CRM for notification. See Telephone encounter for: 10/07/18.  [facility] [phone]) requesting v/o to continue PT 1x3wk   *ok  to leave VM

## 2018-10-07 NOTE — Telephone Encounter (Signed)
pls see note 

## 2018-10-07 NOTE — Telephone Encounter (Signed)
Pain med refilled on 10/05/2018.

## 2018-10-10 ENCOUNTER — Other Ambulatory Visit: Payer: Self-pay | Admitting: Medical Oncology

## 2018-10-10 ENCOUNTER — Telehealth: Payer: Self-pay | Admitting: Medical Oncology

## 2018-10-10 DIAGNOSIS — G893 Neoplasm related pain (acute) (chronic): Secondary | ICD-10-CM | POA: Diagnosis not present

## 2018-10-10 DIAGNOSIS — C7989 Secondary malignant neoplasm of other specified sites: Secondary | ICD-10-CM | POA: Diagnosis not present

## 2018-10-10 DIAGNOSIS — C3491 Malignant neoplasm of unspecified part of right bronchus or lung: Secondary | ICD-10-CM

## 2018-10-10 DIAGNOSIS — C7800 Secondary malignant neoplasm of unspecified lung: Secondary | ICD-10-CM | POA: Diagnosis not present

## 2018-10-10 DIAGNOSIS — C539 Malignant neoplasm of cervix uteri, unspecified: Secondary | ICD-10-CM | POA: Diagnosis not present

## 2018-10-10 DIAGNOSIS — I483 Typical atrial flutter: Secondary | ICD-10-CM | POA: Diagnosis not present

## 2018-10-10 DIAGNOSIS — R630 Anorexia: Secondary | ICD-10-CM | POA: Diagnosis not present

## 2018-10-10 MED ORDER — COLLAGENASE 250 UNIT/GM EX OINT: 1.0000 "application " | TOPICAL_OINTMENT | Freq: Every day | CUTANEOUS | 2 refills | Status: DC

## 2018-10-10 NOTE — Telephone Encounter (Signed)
Ok to refill Santyl cream x 2 refills.

## 2018-10-11 ENCOUNTER — Other Ambulatory Visit: Payer: Self-pay | Admitting: Medical Oncology

## 2018-10-11 DIAGNOSIS — C539 Malignant neoplasm of cervix uteri, unspecified: Secondary | ICD-10-CM | POA: Diagnosis not present

## 2018-10-11 DIAGNOSIS — I483 Typical atrial flutter: Secondary | ICD-10-CM | POA: Diagnosis not present

## 2018-10-11 DIAGNOSIS — C7800 Secondary malignant neoplasm of unspecified lung: Secondary | ICD-10-CM | POA: Diagnosis not present

## 2018-10-11 DIAGNOSIS — G893 Neoplasm related pain (acute) (chronic): Secondary | ICD-10-CM | POA: Diagnosis not present

## 2018-10-11 DIAGNOSIS — R630 Anorexia: Secondary | ICD-10-CM | POA: Diagnosis not present

## 2018-10-11 DIAGNOSIS — C7989 Secondary malignant neoplasm of other specified sites: Secondary | ICD-10-CM | POA: Diagnosis not present

## 2018-10-11 DIAGNOSIS — C3491 Malignant neoplasm of unspecified part of right bronchus or lung: Secondary | ICD-10-CM

## 2018-10-11 MED ORDER — COLLAGENASE 250 UNIT/GM EX OINT: 1.0000 "application " | TOPICAL_OINTMENT | Freq: Every day | CUTANEOUS | 2 refills | Status: AC

## 2018-10-12 ENCOUNTER — Inpatient Hospital Stay: Payer: Medicare Other | Attending: Internal Medicine

## 2018-10-12 DIAGNOSIS — C7800 Secondary malignant neoplasm of unspecified lung: Secondary | ICD-10-CM | POA: Diagnosis not present

## 2018-10-12 DIAGNOSIS — R630 Anorexia: Secondary | ICD-10-CM | POA: Diagnosis not present

## 2018-10-12 DIAGNOSIS — G893 Neoplasm related pain (acute) (chronic): Secondary | ICD-10-CM | POA: Diagnosis not present

## 2018-10-12 DIAGNOSIS — C539 Malignant neoplasm of cervix uteri, unspecified: Secondary | ICD-10-CM | POA: Diagnosis not present

## 2018-10-12 DIAGNOSIS — I483 Typical atrial flutter: Secondary | ICD-10-CM | POA: Diagnosis not present

## 2018-10-12 DIAGNOSIS — C7989 Secondary malignant neoplasm of other specified sites: Secondary | ICD-10-CM | POA: Diagnosis not present

## 2018-10-13 DIAGNOSIS — R630 Anorexia: Secondary | ICD-10-CM | POA: Diagnosis not present

## 2018-10-13 DIAGNOSIS — C539 Malignant neoplasm of cervix uteri, unspecified: Secondary | ICD-10-CM | POA: Diagnosis not present

## 2018-10-13 DIAGNOSIS — G893 Neoplasm related pain (acute) (chronic): Secondary | ICD-10-CM | POA: Diagnosis not present

## 2018-10-13 DIAGNOSIS — C7989 Secondary malignant neoplasm of other specified sites: Secondary | ICD-10-CM | POA: Diagnosis not present

## 2018-10-13 DIAGNOSIS — I483 Typical atrial flutter: Secondary | ICD-10-CM | POA: Diagnosis not present

## 2018-10-13 DIAGNOSIS — C7800 Secondary malignant neoplasm of unspecified lung: Secondary | ICD-10-CM | POA: Diagnosis not present

## 2018-10-14 DIAGNOSIS — R630 Anorexia: Secondary | ICD-10-CM | POA: Diagnosis not present

## 2018-10-14 DIAGNOSIS — C7989 Secondary malignant neoplasm of other specified sites: Secondary | ICD-10-CM | POA: Diagnosis not present

## 2018-10-14 DIAGNOSIS — I483 Typical atrial flutter: Secondary | ICD-10-CM | POA: Diagnosis not present

## 2018-10-14 DIAGNOSIS — G893 Neoplasm related pain (acute) (chronic): Secondary | ICD-10-CM | POA: Diagnosis not present

## 2018-10-14 DIAGNOSIS — C539 Malignant neoplasm of cervix uteri, unspecified: Secondary | ICD-10-CM | POA: Diagnosis not present

## 2018-10-14 DIAGNOSIS — C7800 Secondary malignant neoplasm of unspecified lung: Secondary | ICD-10-CM | POA: Diagnosis not present

## 2018-10-17 DIAGNOSIS — C7989 Secondary malignant neoplasm of other specified sites: Secondary | ICD-10-CM | POA: Diagnosis not present

## 2018-10-17 DIAGNOSIS — R630 Anorexia: Secondary | ICD-10-CM | POA: Diagnosis not present

## 2018-10-17 DIAGNOSIS — G893 Neoplasm related pain (acute) (chronic): Secondary | ICD-10-CM | POA: Diagnosis not present

## 2018-10-17 DIAGNOSIS — C7800 Secondary malignant neoplasm of unspecified lung: Secondary | ICD-10-CM | POA: Diagnosis not present

## 2018-10-17 DIAGNOSIS — C539 Malignant neoplasm of cervix uteri, unspecified: Secondary | ICD-10-CM | POA: Diagnosis not present

## 2018-10-17 DIAGNOSIS — I483 Typical atrial flutter: Secondary | ICD-10-CM | POA: Diagnosis not present

## 2018-10-18 DIAGNOSIS — G893 Neoplasm related pain (acute) (chronic): Secondary | ICD-10-CM | POA: Diagnosis not present

## 2018-10-18 DIAGNOSIS — C7989 Secondary malignant neoplasm of other specified sites: Secondary | ICD-10-CM | POA: Diagnosis not present

## 2018-10-18 DIAGNOSIS — C539 Malignant neoplasm of cervix uteri, unspecified: Secondary | ICD-10-CM | POA: Diagnosis not present

## 2018-10-18 DIAGNOSIS — R630 Anorexia: Secondary | ICD-10-CM | POA: Diagnosis not present

## 2018-10-18 DIAGNOSIS — C7800 Secondary malignant neoplasm of unspecified lung: Secondary | ICD-10-CM | POA: Diagnosis not present

## 2018-10-18 DIAGNOSIS — I483 Typical atrial flutter: Secondary | ICD-10-CM | POA: Diagnosis not present

## 2018-10-19 ENCOUNTER — Telehealth: Payer: Self-pay | Admitting: *Deleted

## 2018-10-19 DIAGNOSIS — G893 Neoplasm related pain (acute) (chronic): Secondary | ICD-10-CM | POA: Diagnosis not present

## 2018-10-19 DIAGNOSIS — R630 Anorexia: Secondary | ICD-10-CM | POA: Diagnosis not present

## 2018-10-19 DIAGNOSIS — C7989 Secondary malignant neoplasm of other specified sites: Secondary | ICD-10-CM | POA: Diagnosis not present

## 2018-10-19 DIAGNOSIS — C539 Malignant neoplasm of cervix uteri, unspecified: Secondary | ICD-10-CM | POA: Diagnosis not present

## 2018-10-19 DIAGNOSIS — C7800 Secondary malignant neoplasm of unspecified lung: Secondary | ICD-10-CM | POA: Diagnosis not present

## 2018-10-19 DIAGNOSIS — I483 Typical atrial flutter: Secondary | ICD-10-CM | POA: Diagnosis not present

## 2018-10-19 MED ORDER — MORPHINE SULFATE (CONCENTRATE) 10 MG /0.5 ML PO SOLN: 10.0000 mg | ORAL | 0 refills | Status: AC | PRN

## 2018-10-19 NOTE — Telephone Encounter (Signed)
Pt actively dying rr 38, hr 154, requesting VO for pt to be DNR status and rx for Roxanol for pain.Review with MD, rx to be faxed to [facility].

## 2018-10-22 DEATH — deceased

## 2018-10-27 ENCOUNTER — Encounter: Payer: Self-pay | Admitting: Family Medicine

## 2018-10-27 DIAGNOSIS — G6282 Radiation-induced polyneuropathy: Secondary | ICD-10-CM | POA: Insufficient documentation

## 2018-11-04 NOTE — Telephone Encounter (Signed)
Please call to see if the orders were received.  If they were not then please give verbal order.

## 2018-11-07 NOTE — Telephone Encounter (Signed)
Called and gave verbal orders for PT 1x3wk

## 2018-11-23 ENCOUNTER — Inpatient Hospital Stay: Payer: Self-pay | Attending: Internal Medicine

## 2018-12-08 ENCOUNTER — Telehealth: Payer: Self-pay | Admitting: Medical Oncology

## 2018-12-08 NOTE — Telephone Encounter (Signed)
Pt died Nov 12, 2018.

## 2018-12-14 ENCOUNTER — Encounter: Payer: Self-pay | Admitting: Internal Medicine

## 2019-06-12 IMAGING — US US THORACENTESIS ASP PLEURAL SPACE W/IMG GUIDE
1 series · 3 of 3 positions shown · non-contrast
Comparison: none

INDICATION: Patient with history of stage IV lung cancer, dyspnea, recurrent
right pleural effusion. Request made for therapeutic right
thoracentesis.

[Series 1: us thoracentesis asp pleural space w/img guide · 3 of 3 slices shown]
[im 1/3]
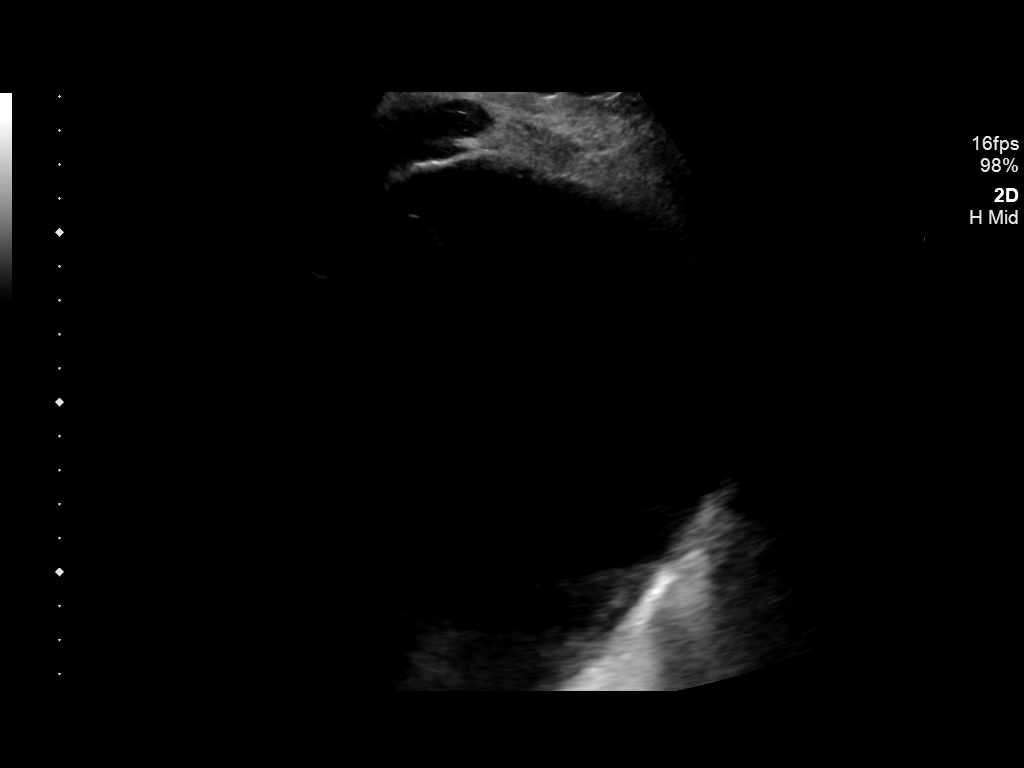
[im 2/3]
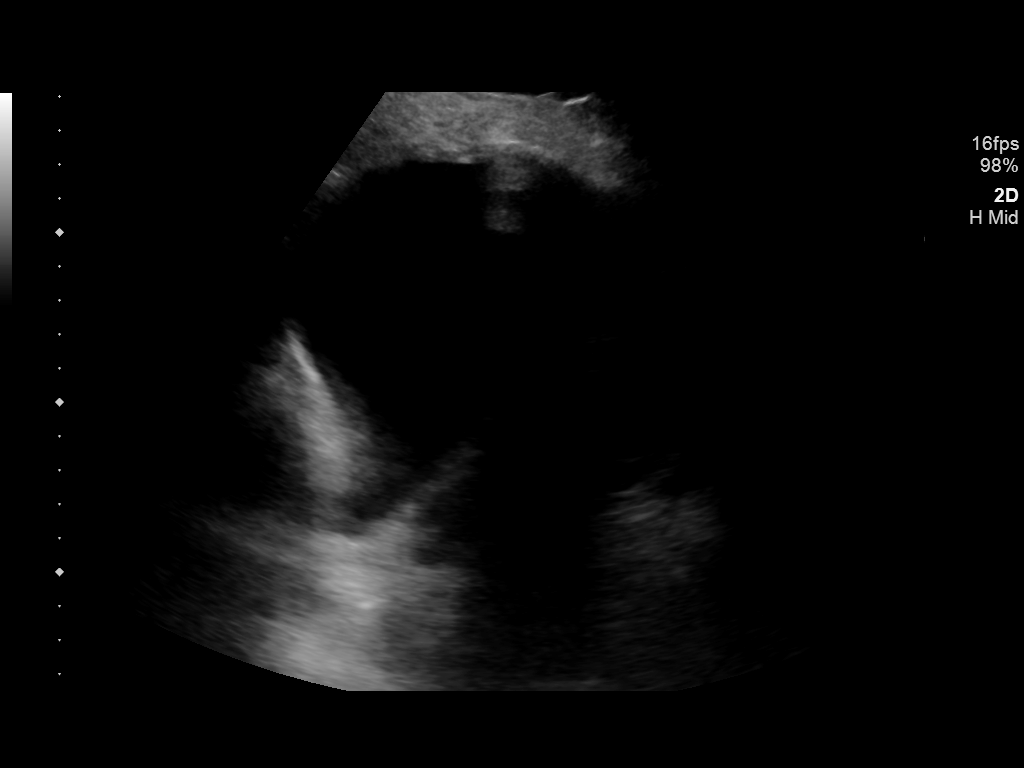
[im 3/3]
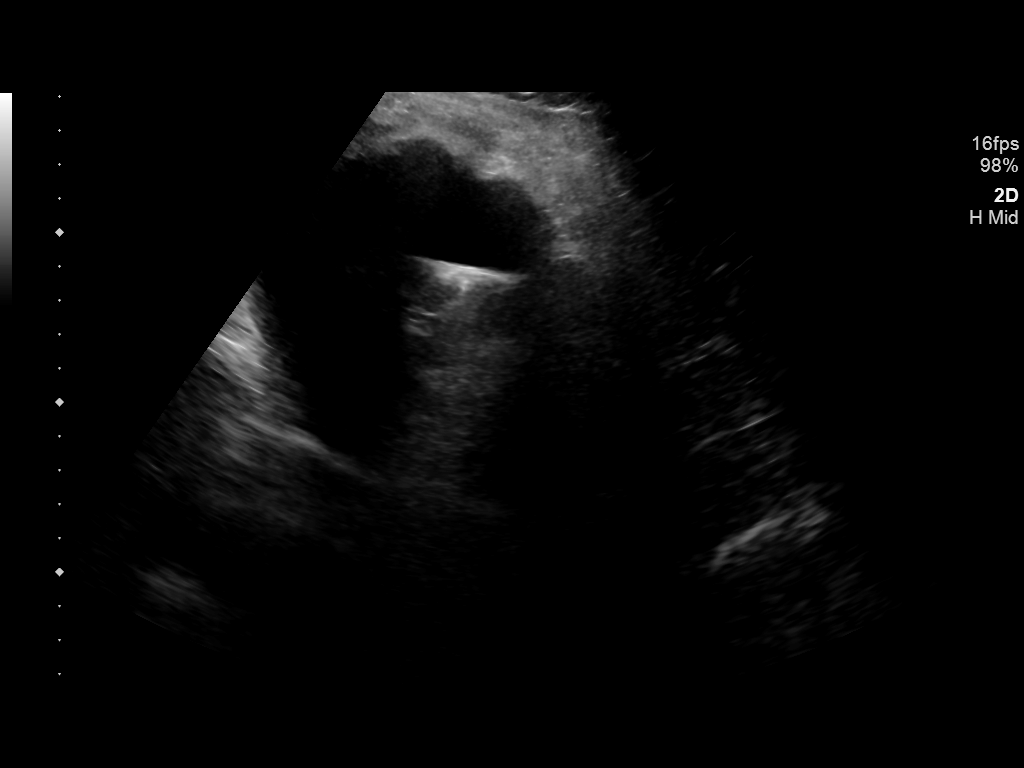

[3 of 3 positions shown; findings below may reference images not displayed]

EXAM:
ULTRASOUND GUIDED THERAPEUTIC RIGHT THORACENTESIS

MEDICATIONS:
None

COMPLICATIONS:
None immediate.

PROCEDURE:
An ultrasound guided thoracentesis was thoroughly discussed with the
patient and questions answered. The benefits, risks, alternatives
and complications were also discussed. The patient understands and
wishes to proceed with the procedure. Written consent was obtained.

Ultrasound was performed to localize and mark an adequate pocket of
fluid in the right chest. The area was then prepped and draped in
the normal sterile fashion. 1% Lidocaine was used for local
anesthesia. Under ultrasound guidance a 6 Fr Safe-T-Centesis
catheter was introduced. Thoracentesis was performed. The catheter
was removed and a dressing applied.
FINDINGS: A total of approximately 1.3 liters of yellow fluid was removed. Due
to patient coughing/chest discomfort only the above amount of fluid
was removed today.
IMPRESSION: Successful ultrasound guided therapeutic right thoracentesis
yielding 1.3 liters of pleural fluid. Follow-up chest x-ray revealed
no pneumothorax.

## 2019-08-20 IMAGING — DX DG CHEST DECUBITUS*R*
3 series · 3 of 3 positions shown · non-contrast
Comparison: 02/23/2018

CLINICAL DATA: Pleural effusion and lung cancer.

EXAM:
CHEST - RIGHT DECUBITUS

[chest decu (1 of 3)]
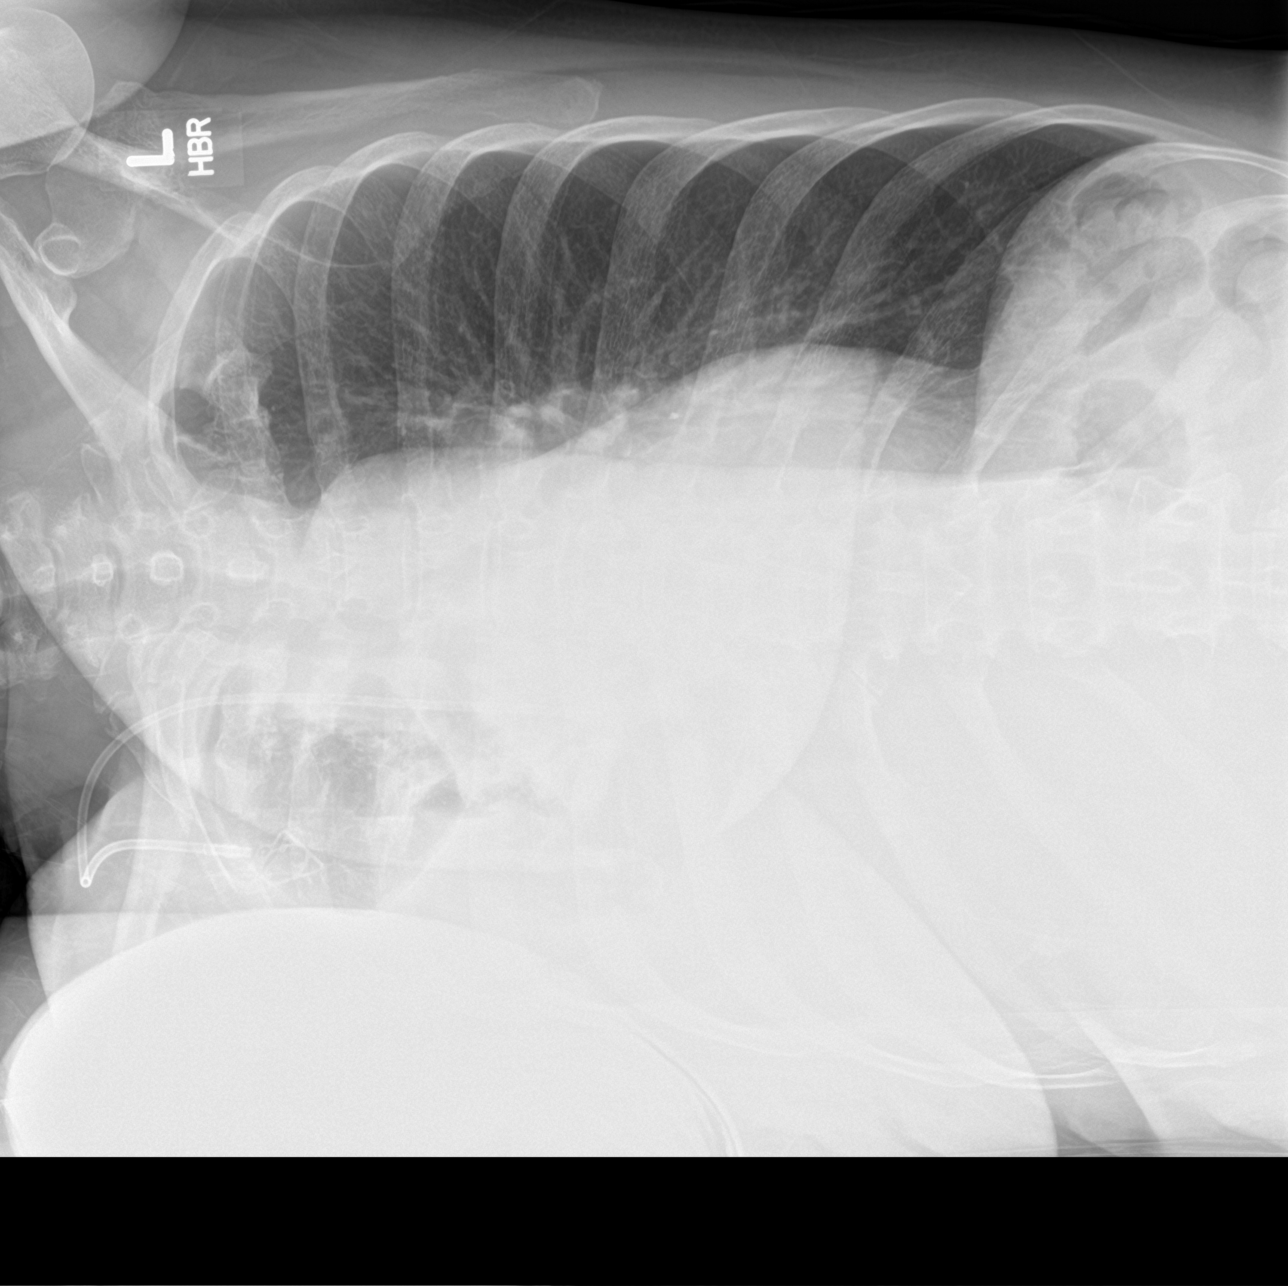

[chest decu (2 of 3)]
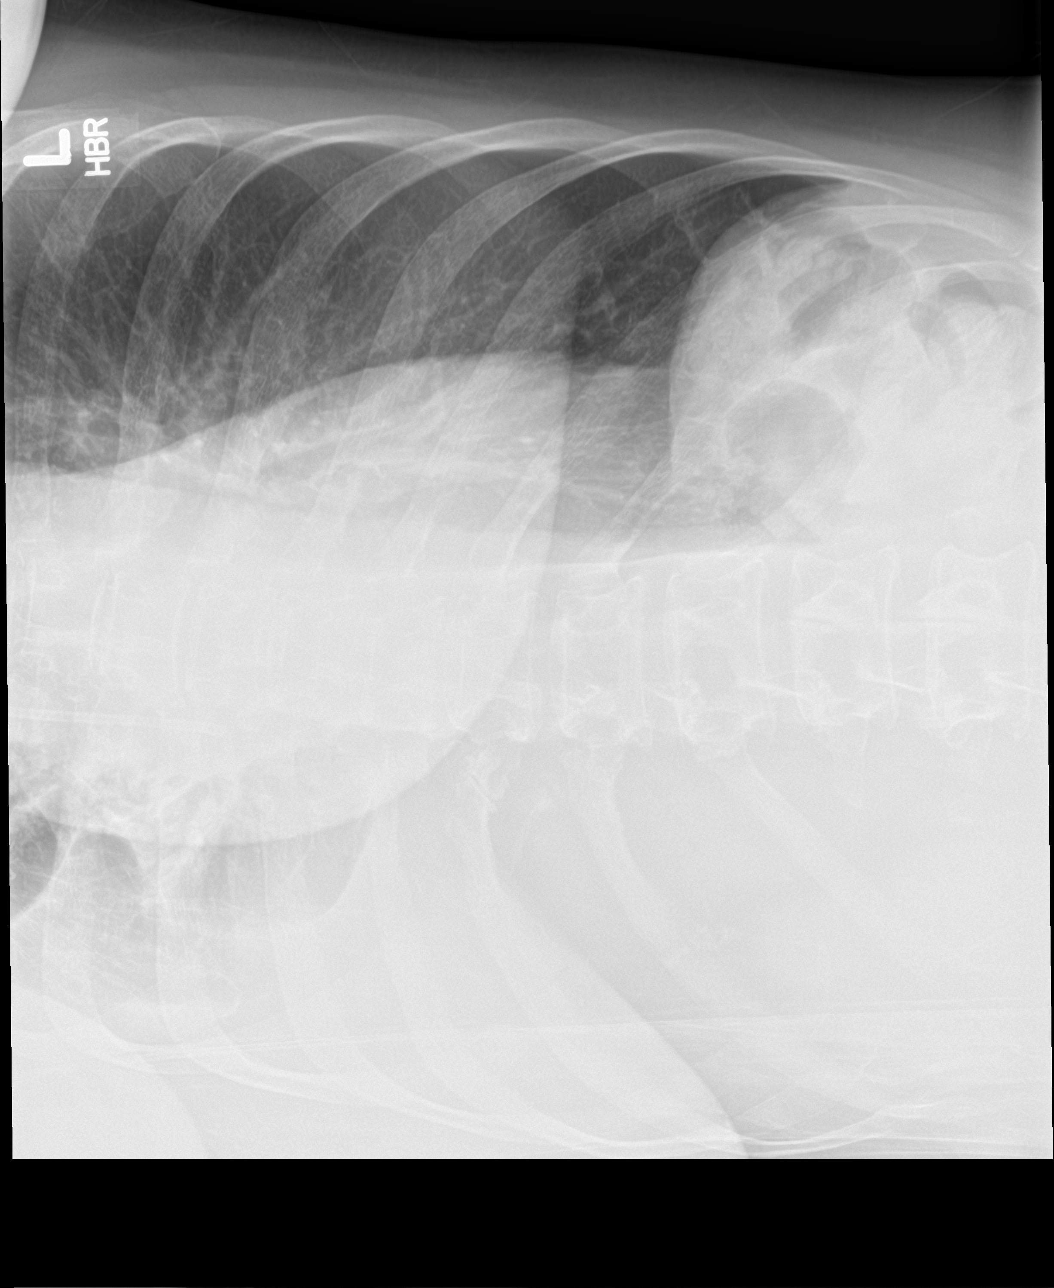

[chest decu (3 of 3)]
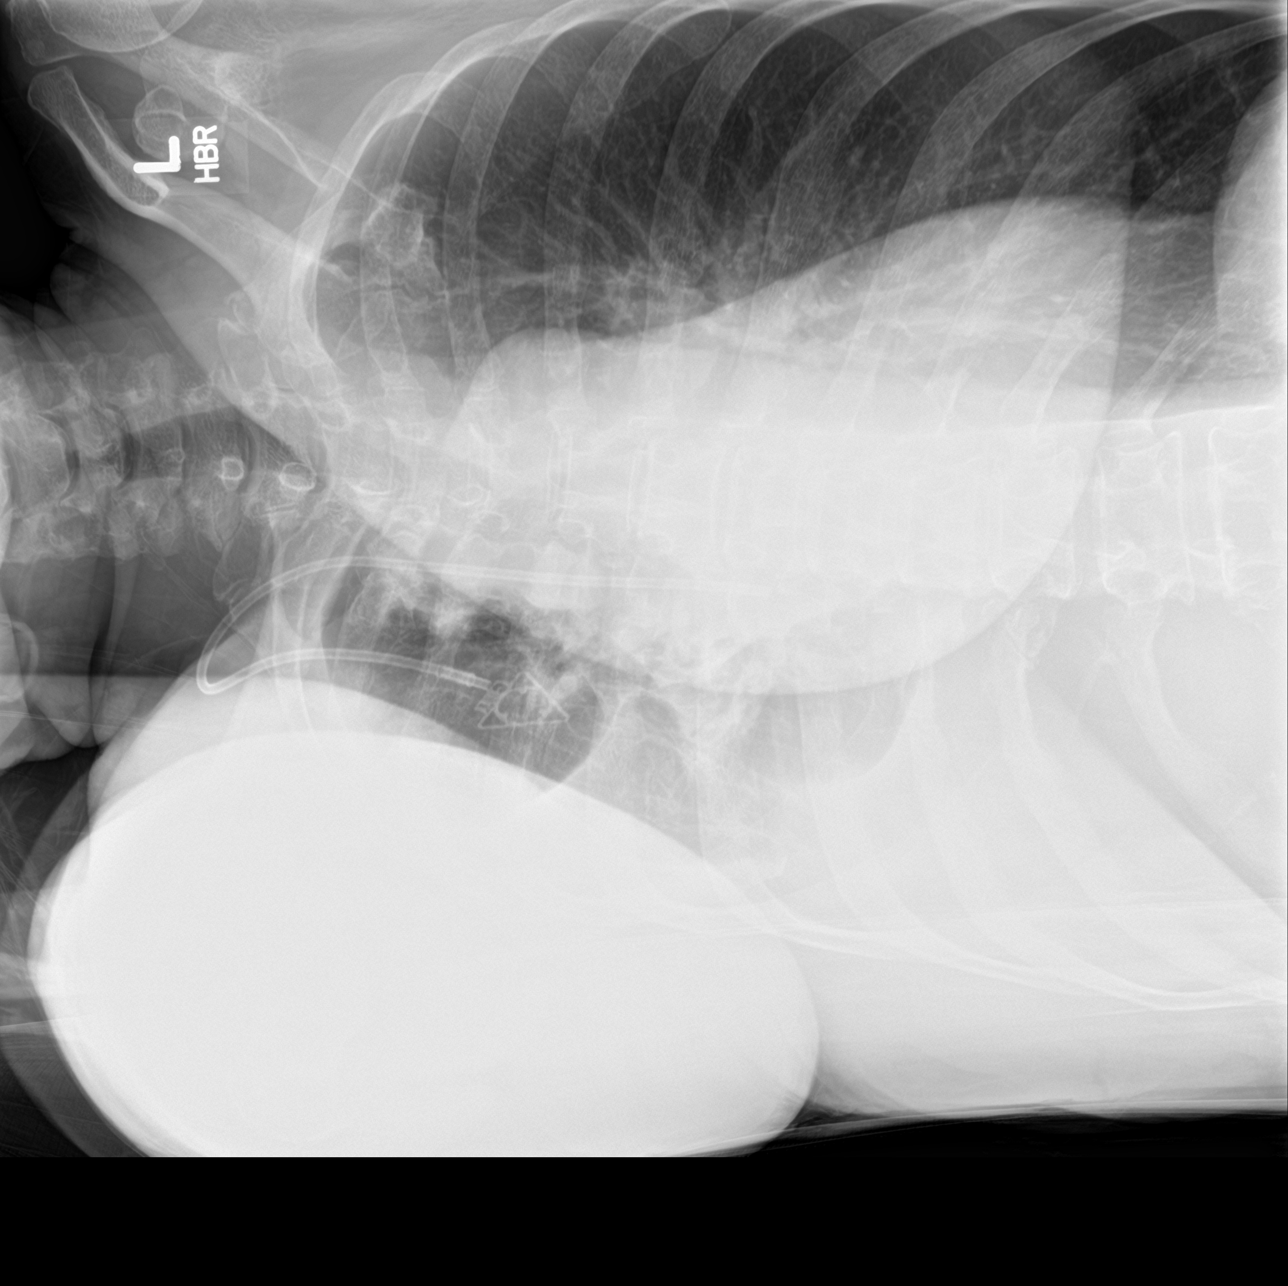

[3 of 3 positions shown; findings below may reference images not displayed]

FINDINGS: Unchanged morphology of the large right pleural effusion at the base
and along the lateral chest wall, capping the apex. The underlying
lung is obscured. Normal heart size. Clear left lung.
IMPRESSION: Large right pleural effusion with unchanged distribution when
decubitus, consistent with loculation.
